# Patient Record
Sex: Female | Born: 1945 | Race: White | Hispanic: No | Marital: Married | Smoking: Former smoker
Health system: Southern US, Community
[De-identification: ages and names within clinical notes are randomized; demographics above are authoritative.]

## PROBLEM LIST (undated history)

## (undated) DIAGNOSIS — J3800 Paralysis of vocal cords and larynx, unspecified: Secondary | ICD-10-CM

## (undated) DIAGNOSIS — K219 Gastro-esophageal reflux disease without esophagitis: Secondary | ICD-10-CM

## (undated) DIAGNOSIS — J9601 Acute respiratory failure with hypoxia: Secondary | ICD-10-CM

## (undated) DIAGNOSIS — I739 Peripheral vascular disease, unspecified: Secondary | ICD-10-CM

## (undated) DIAGNOSIS — C8590 Non-Hodgkin lymphoma, unspecified, unspecified site: Secondary | ICD-10-CM

## (undated) DIAGNOSIS — Z923 Personal history of irradiation: Secondary | ICD-10-CM

## (undated) DIAGNOSIS — R011 Cardiac murmur, unspecified: Secondary | ICD-10-CM

## (undated) DIAGNOSIS — E669 Obesity, unspecified: Secondary | ICD-10-CM

## (undated) DIAGNOSIS — M1990 Unspecified osteoarthritis, unspecified site: Secondary | ICD-10-CM

## (undated) DIAGNOSIS — J189 Pneumonia, unspecified organism: Secondary | ICD-10-CM

## (undated) DIAGNOSIS — T4145XA Adverse effect of unspecified anesthetic, initial encounter: Secondary | ICD-10-CM

## (undated) DIAGNOSIS — E079 Disorder of thyroid, unspecified: Secondary | ICD-10-CM

## (undated) DIAGNOSIS — E039 Hypothyroidism, unspecified: Secondary | ICD-10-CM

## (undated) DIAGNOSIS — I10 Essential (primary) hypertension: Secondary | ICD-10-CM

## (undated) DIAGNOSIS — C801 Malignant (primary) neoplasm, unspecified: Secondary | ICD-10-CM

## (undated) DIAGNOSIS — C8330 Diffuse large B-cell lymphoma, unspecified site: Secondary | ICD-10-CM

## (undated) DIAGNOSIS — T8859XA Other complications of anesthesia, initial encounter: Secondary | ICD-10-CM

## (undated) HISTORY — DX: Obesity, unspecified: E66.9

## (undated) HISTORY — PX: JOINT REPLACEMENT: SHX530

## (undated) HISTORY — PX: AMPUTATION: SHX166

## (undated) HISTORY — PX: VASCULAR SURGERY: SHX849

## (undated) HISTORY — DX: Non-Hodgkin lymphoma, unspecified, unspecified site: C85.90

## (undated) HISTORY — DX: Essential (primary) hypertension: I10

## (undated) HISTORY — PX: TONSILLECTOMY: SUR1361

## (undated) HISTORY — PX: ESOPHAGOGASTRODUODENOSCOPY: SHX1529

## (undated) HISTORY — PX: CATARACT EXTRACTION, BILATERAL: SHX1313

## (undated) HISTORY — PX: COLONOSCOPY: SHX174

## (undated) HISTORY — DX: Malignant (primary) neoplasm, unspecified: C80.1

## (undated) HISTORY — PX: TUBAL LIGATION: SHX77

---
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
## 1993-04-21 DIAGNOSIS — I739 Peripheral vascular disease, unspecified: Secondary | ICD-10-CM

## 1993-04-21 HISTORY — DX: Peripheral vascular disease, unspecified: I73.9

## 1993-04-21 HISTORY — PX: HAND SURGERY: SHX662

## 1999-07-11 ENCOUNTER — Other Ambulatory Visit: Admission: RE | Admit: 1999-07-11 | Discharge: 1999-07-11 | Payer: Self-pay | Admitting: Obstetrics and Gynecology

## 2002-03-30 ENCOUNTER — Other Ambulatory Visit: Admission: RE | Admit: 2002-03-30 | Discharge: 2002-03-30 | Payer: Self-pay | Admitting: Obstetrics and Gynecology

## 2003-08-02 ENCOUNTER — Other Ambulatory Visit: Admission: RE | Admit: 2003-08-02 | Discharge: 2003-08-02 | Payer: Self-pay | Admitting: Obstetrics and Gynecology

## 2004-06-21 ENCOUNTER — Ambulatory Visit: Payer: Self-pay | Admitting: Internal Medicine

## 2004-09-12 ENCOUNTER — Ambulatory Visit: Payer: Self-pay | Admitting: Internal Medicine

## 2004-09-26 ENCOUNTER — Ambulatory Visit: Payer: Self-pay | Admitting: Internal Medicine

## 2004-11-25 ENCOUNTER — Ambulatory Visit: Payer: Self-pay | Admitting: Internal Medicine

## 2005-01-27 ENCOUNTER — Other Ambulatory Visit: Admission: RE | Admit: 2005-01-27 | Discharge: 2005-01-27 | Payer: Self-pay | Admitting: Obstetrics and Gynecology

## 2005-02-25 ENCOUNTER — Ambulatory Visit: Payer: Self-pay | Admitting: Internal Medicine

## 2005-06-02 ENCOUNTER — Ambulatory Visit: Payer: Self-pay | Admitting: Internal Medicine

## 2005-06-16 ENCOUNTER — Ambulatory Visit: Payer: Self-pay | Admitting: Internal Medicine

## 2006-04-07 ENCOUNTER — Ambulatory Visit: Payer: Self-pay | Admitting: Internal Medicine

## 2006-06-26 ENCOUNTER — Ambulatory Visit: Payer: Self-pay | Admitting: Internal Medicine

## 2006-06-26 LAB — CONVERTED CEMR LAB
ALT: 16 units/L (ref 0–40)
AST: 20 units/L (ref 0–37)
Albumin: 3.8 g/dL (ref 3.5–5.2)
Alkaline Phosphatase: 56 units/L (ref 39–117)
BUN: 14 mg/dL (ref 6–23)
Basophils Absolute: 0 10*3/uL (ref 0.0–0.1)
Calcium: 9.3 mg/dL (ref 8.4–10.5)
Chloride: 101 meq/L (ref 96–112)
Cholesterol: 205 mg/dL (ref 0–200)
Eosinophils Absolute: 0.2 10*3/uL (ref 0.0–0.6)
Eosinophils Relative: 2.3 % (ref 0.0–5.0)
GFR calc Af Amer: 110 mL/min
GFR calc non Af Amer: 91 mL/min
Glucose, Bld: 100 mg/dL — ABNORMAL HIGH (ref 70–99)
HDL: 44.9 mg/dL (ref >39.0)
Lymphocytes Relative: 22.3 % (ref 12.0–46.0)
MCV: 82.3 fL (ref 78.0–100.0)
Monocytes Relative: 11.1 % — ABNORMAL HIGH (ref 3.0–11.0)
Neutro Abs: 4.4 10*3/uL (ref 1.4–7.7)
Platelets: 274 10*3/uL (ref 150–400)
RBC: 4.92 M/uL (ref 3.87–5.11)
Triglycerides: 68 mg/dL (ref 0–149)
VLDL: 14 mg/dL (ref 0–40)
WBC: 7 10*3/uL (ref 4.5–10.5)

## 2006-08-13 ENCOUNTER — Ambulatory Visit: Payer: Self-pay | Admitting: Internal Medicine

## 2006-08-25 ENCOUNTER — Encounter: Payer: Self-pay | Admitting: Internal Medicine

## 2006-08-25 ENCOUNTER — Ambulatory Visit: Payer: Self-pay

## 2006-09-10 ENCOUNTER — Ambulatory Visit: Payer: Self-pay | Admitting: Internal Medicine

## 2008-03-21 ENCOUNTER — Encounter: Payer: Self-pay | Admitting: Internal Medicine

## 2008-03-24 ENCOUNTER — Encounter: Payer: Self-pay | Admitting: Internal Medicine

## 2009-03-21 DIAGNOSIS — C801 Malignant (primary) neoplasm, unspecified: Secondary | ICD-10-CM

## 2009-03-21 DIAGNOSIS — C50919 Malignant neoplasm of unspecified site of unspecified female breast: Secondary | ICD-10-CM

## 2009-03-21 HISTORY — DX: Malignant neoplasm of unspecified site of unspecified female breast: C50.919

## 2009-03-21 HISTORY — DX: Malignant (primary) neoplasm, unspecified: C80.1

## 2009-03-26 ENCOUNTER — Encounter (INDEPENDENT_AMBULATORY_CARE_PROVIDER_SITE_OTHER): Payer: Self-pay | Admitting: *Deleted

## 2009-03-27 ENCOUNTER — Encounter (INDEPENDENT_AMBULATORY_CARE_PROVIDER_SITE_OTHER): Payer: Self-pay | Admitting: *Deleted

## 2009-03-29 ENCOUNTER — Encounter (INDEPENDENT_AMBULATORY_CARE_PROVIDER_SITE_OTHER): Payer: Self-pay | Admitting: *Deleted

## 2009-04-03 ENCOUNTER — Encounter (INDEPENDENT_AMBULATORY_CARE_PROVIDER_SITE_OTHER): Payer: Self-pay | Admitting: *Deleted

## 2009-04-18 ENCOUNTER — Encounter: Admission: RE | Admit: 2009-04-18 | Discharge: 2009-04-18 | Payer: Self-pay | Admitting: General Surgery

## 2009-04-21 HISTORY — PX: BREAST SURGERY: SHX581

## 2009-04-27 ENCOUNTER — Ambulatory Visit: Payer: Self-pay | Admitting: Oncology

## 2009-04-30 ENCOUNTER — Encounter: Payer: Self-pay | Admitting: Internal Medicine

## 2009-04-30 ENCOUNTER — Ambulatory Visit: Payer: Self-pay | Admitting: Oncology

## 2009-05-02 ENCOUNTER — Encounter: Payer: Self-pay | Admitting: Internal Medicine

## 2009-05-02 LAB — CBC WITH DIFFERENTIAL (CANCER CENTER ONLY)
BASO#: 0.1 10*3/uL (ref 0.0–0.2)
BASO%: 0.8 % (ref 0.0–2.0)
Eosinophils Absolute: 0.4 10*3/uL (ref 0.0–0.5)
HCT: 38.5 % (ref 34.8–46.6)
HGB: 12.7 g/dL (ref 11.6–15.9)
LYMPH#: 2.4 10*3/uL (ref 0.9–3.3)
MCV: 84 fL (ref 81–101)
MONO#: 0.7 10*3/uL (ref 0.1–0.9)
NEUT%: 54.3 % (ref 39.6–80.0)
RBC: 4.6 10*6/uL (ref 3.70–5.32)
WBC: 7.8 10*3/uL (ref 3.9–10.0)

## 2009-05-02 LAB — CMP (CANCER CENTER ONLY)
ALT(SGPT): 17 U/L (ref 10–47)
BUN, Bld: 19 mg/dL (ref 7–22)
CO2: 29 mEq/L (ref 18–33)
Calcium: 9 mg/dL (ref 8.0–10.3)
Chloride: 100 mEq/L (ref 98–108)
Creat: 0.9 mg/dl (ref 0.6–1.2)
Glucose, Bld: 106 mg/dL (ref 73–118)

## 2009-05-03 LAB — CANCER ANTIGEN 27.29: CA 27.29: 25 U/mL (ref 0–39)

## 2009-05-03 LAB — LACTATE DEHYDROGENASE: LDH: 180 U/L (ref 94–250)

## 2009-05-31 ENCOUNTER — Ambulatory Visit: Payer: Self-pay | Admitting: Oncology

## 2009-06-04 ENCOUNTER — Encounter: Payer: Self-pay | Admitting: Internal Medicine

## 2009-06-04 LAB — COMPREHENSIVE METABOLIC PANEL
AST: 12 U/L (ref 0–37)
Alkaline Phosphatase: 49 U/L (ref 39–117)
BUN: 15 mg/dL (ref 6–23)
Creatinine, Ser: 0.77 mg/dL (ref 0.40–1.20)
Glucose, Bld: 125 mg/dL — ABNORMAL HIGH (ref 70–99)

## 2009-06-04 LAB — CBC WITH DIFFERENTIAL/PLATELET
Basophils Absolute: 0 10*3/uL (ref 0.0–0.1)
EOS%: 3.8 % (ref 0.0–7.0)
Eosinophils Absolute: 0.2 10*3/uL (ref 0.0–0.5)
HCT: 38.9 % (ref 34.8–46.6)
HGB: 13.3 g/dL (ref 11.6–15.9)
LYMPH%: 23.8 % (ref 14.0–49.7)
MCH: 28.8 pg (ref 25.1–34.0)
MCV: 84.4 fL (ref 79.5–101.0)
MONO%: 8.7 % (ref 0.0–14.0)
NEUT%: 63.1 % (ref 38.4–76.8)
Platelets: 238 10*3/uL (ref 145–400)

## 2009-07-24 ENCOUNTER — Ambulatory Visit: Payer: Self-pay | Admitting: Internal Medicine

## 2009-07-24 LAB — CONVERTED CEMR LAB
ALT: 14 units/L (ref 0–35)
Alkaline Phosphatase: 49 units/L (ref 39–117)
BUN: 13 mg/dL (ref 6–23)
Basophils Relative: 1 % (ref 0.0–3.0)
Bilirubin, Direct: 0.1 mg/dL (ref 0.0–0.3)
CO2: 30 meq/L (ref 19–32)
Chloride: 104 meq/L (ref 96–112)
Eosinophils Absolute: 0.3 10*3/uL (ref 0.0–0.7)
Eosinophils Relative: 4.6 % (ref 0.0–5.0)
GFR calc non Af Amer: 77.02 mL/min (ref >60)
Glucose, Bld: 102 mg/dL — ABNORMAL HIGH (ref 70–99)
HDL: 44.9 mg/dL (ref >39.00)
LDL Cholesterol: 117 mg/dL — ABNORMAL HIGH (ref 0–99)
Lymphocytes Relative: 27.9 % (ref 12.0–46.0)
MCV: 83.5 fL (ref 78.0–100.0)
Monocytes Absolute: 0.7 10*3/uL (ref 0.1–1.0)
Neutrophils Relative %: 57 % (ref 43.0–77.0)
Nitrite: NEGATIVE
Platelets: 255 10*3/uL (ref 150.0–400.0)
Potassium: 4.1 meq/L (ref 3.5–5.1)
RBC: 4.81 M/uL (ref 3.87–5.11)
Sodium: 141 meq/L (ref 135–145)
Total Bilirubin: 0.6 mg/dL (ref 0.3–1.2)
Total Protein, Urine: NEGATIVE mg/dL
Urine Glucose: NEGATIVE mg/dL
VLDL: 20.8 mg/dL (ref 0.0–40.0)
WBC: 6.9 10*3/uL (ref 4.5–10.5)
pH: 6.5 (ref 5.0–8.0)

## 2009-08-02 ENCOUNTER — Ambulatory Visit: Payer: Self-pay | Admitting: Internal Medicine

## 2009-08-02 DIAGNOSIS — I10 Essential (primary) hypertension: Secondary | ICD-10-CM | POA: Insufficient documentation

## 2009-08-31 ENCOUNTER — Ambulatory Visit: Payer: Self-pay | Admitting: Oncology

## 2009-09-03 ENCOUNTER — Encounter: Payer: Self-pay | Admitting: Internal Medicine

## 2009-09-03 LAB — COMPREHENSIVE METABOLIC PANEL
AST: 15 U/L (ref 0–37)
Albumin: 3.9 g/dL (ref 3.5–5.2)
Alkaline Phosphatase: 51 U/L (ref 39–117)
BUN: 15 mg/dL (ref 6–23)
Creatinine, Ser: 0.88 mg/dL (ref 0.40–1.20)
Potassium: 4.8 mEq/L (ref 3.5–5.3)
Total Bilirubin: 0.4 mg/dL (ref 0.3–1.2)

## 2009-09-03 LAB — CBC WITH DIFFERENTIAL/PLATELET
Basophils Absolute: 0 10*3/uL (ref 0.0–0.1)
EOS%: 4.4 % (ref 0.0–7.0)
HGB: 13.8 g/dL (ref 11.6–15.9)
LYMPH%: 24.8 % (ref 14.0–49.7)
MCH: 28.7 pg (ref 25.1–34.0)
MCV: 83.8 fL (ref 79.5–101.0)
MONO%: 7.3 % (ref 0.0–14.0)
RDW: 14.1 % (ref 11.2–14.5)

## 2009-10-15 ENCOUNTER — Ambulatory Visit: Payer: Self-pay | Admitting: Internal Medicine

## 2009-11-05 ENCOUNTER — Encounter: Admission: RE | Admit: 2009-11-05 | Discharge: 2009-11-05 | Payer: Self-pay | Admitting: Oncology

## 2009-11-08 ENCOUNTER — Ambulatory Visit: Payer: Self-pay | Admitting: Oncology

## 2009-11-12 ENCOUNTER — Encounter: Payer: Self-pay | Admitting: Internal Medicine

## 2009-11-12 LAB — CBC WITH DIFFERENTIAL/PLATELET
EOS%: 3.6 % (ref 0.0–7.0)
MCH: 29 pg (ref 25.1–34.0)
MCHC: 34.3 g/dL (ref 31.5–36.0)
MCV: 84.8 fL (ref 79.5–101.0)
MONO%: 7.3 % (ref 0.0–14.0)
RBC: 4.7 10*6/uL (ref 3.70–5.45)
RDW: 13.9 % (ref 11.2–14.5)

## 2009-11-12 LAB — COMPREHENSIVE METABOLIC PANEL
AST: 15 U/L (ref 0–37)
Albumin: 3.9 g/dL (ref 3.5–5.2)
Alkaline Phosphatase: 47 U/L (ref 39–117)
BUN: 15 mg/dL (ref 6–23)
Potassium: 4.1 mEq/L (ref 3.5–5.3)
Sodium: 141 mEq/L (ref 135–145)
Total Protein: 6.7 g/dL (ref 6.0–8.3)

## 2009-11-19 ENCOUNTER — Encounter: Payer: Self-pay | Admitting: Internal Medicine

## 2010-02-05 ENCOUNTER — Encounter: Payer: Self-pay | Admitting: Internal Medicine

## 2010-02-14 ENCOUNTER — Ambulatory Visit: Payer: Self-pay | Admitting: Oncology

## 2010-02-18 ENCOUNTER — Encounter: Payer: Self-pay | Admitting: Internal Medicine

## 2010-02-18 LAB — COMPREHENSIVE METABOLIC PANEL
ALT: 14 U/L (ref 0–35)
AST: 16 U/L (ref 0–37)
Albumin: 4 g/dL (ref 3.5–5.2)
Alkaline Phosphatase: 48 U/L (ref 39–117)
BUN: 17 mg/dL (ref 6–23)
CO2: 23 mEq/L (ref 19–32)
Calcium: 9.4 mg/dL (ref 8.4–10.5)
Chloride: 105 mEq/L (ref 96–112)
Creatinine, Ser: 1.05 mg/dL (ref 0.40–1.20)
Glucose, Bld: 141 mg/dL — ABNORMAL HIGH (ref 70–99)
Potassium: 4.3 mEq/L (ref 3.5–5.3)
Sodium: 140 mEq/L (ref 135–145)
Total Bilirubin: 0.4 mg/dL (ref 0.3–1.2)
Total Protein: 7.2 g/dL (ref 6.0–8.3)

## 2010-02-18 LAB — CBC WITH DIFFERENTIAL/PLATELET
BASO%: 0.5 % (ref 0.0–2.0)
Basophils Absolute: 0 10*3/uL (ref 0.0–0.1)
EOS%: 3.9 % (ref 0.0–7.0)
Eosinophils Absolute: 0.3 10*3/uL (ref 0.0–0.5)
HCT: 43 % (ref 34.8–46.6)
HGB: 14.2 g/dL (ref 11.6–15.9)
LYMPH%: 27.5 % (ref 14.0–49.7)
MCH: 28.2 pg (ref 25.1–34.0)
MCHC: 33 g/dL (ref 31.5–36.0)
MCV: 85.5 fL (ref 79.5–101.0)
MONO#: 0.8 10*3/uL (ref 0.1–0.9)
MONO%: 8.8 % (ref 0.0–14.0)
NEUT#: 5.1 10*3/uL (ref 1.5–6.5)
NEUT%: 59.3 % (ref 38.4–76.8)
Platelets: 249 10*3/uL (ref 145–400)
RBC: 5.03 10*6/uL (ref 3.70–5.45)
RDW: 13.7 % (ref 11.2–14.5)
WBC: 8.6 10*3/uL (ref 3.9–10.3)
lymph#: 2.4 10*3/uL (ref 0.9–3.3)

## 2010-02-26 ENCOUNTER — Encounter: Payer: Self-pay | Admitting: Internal Medicine

## 2010-03-18 ENCOUNTER — Encounter: Admission: RE | Admit: 2010-03-18 | Discharge: 2010-03-18 | Payer: Self-pay | Admitting: General Surgery

## 2010-03-26 ENCOUNTER — Encounter: Admission: RE | Admit: 2010-03-26 | Discharge: 2010-03-26 | Payer: Self-pay | Admitting: General Surgery

## 2010-03-28 ENCOUNTER — Encounter: Payer: Self-pay | Admitting: Internal Medicine

## 2010-03-28 ENCOUNTER — Ambulatory Visit
Admission: RE | Admit: 2010-03-28 | Discharge: 2010-03-28 | Payer: Self-pay | Source: Home / Self Care | Attending: General Surgery | Admitting: General Surgery

## 2010-04-01 HISTORY — PX: BREAST LUMPECTOMY: SHX2

## 2010-04-11 ENCOUNTER — Ambulatory Visit (HOSPITAL_COMMUNITY)
Admission: RE | Admit: 2010-04-11 | Discharge: 2010-04-11 | Payer: Self-pay | Source: Home / Self Care | Attending: General Surgery | Admitting: General Surgery

## 2010-04-20 ENCOUNTER — Emergency Department (HOSPITAL_COMMUNITY)
Admission: EM | Admit: 2010-04-20 | Discharge: 2010-04-20 | Payer: Self-pay | Source: Home / Self Care | Admitting: Emergency Medicine

## 2010-04-21 DIAGNOSIS — Z923 Personal history of irradiation: Secondary | ICD-10-CM

## 2010-04-21 HISTORY — DX: Personal history of irradiation: Z92.3

## 2010-05-12 ENCOUNTER — Encounter: Payer: Self-pay | Admitting: General Surgery

## 2010-05-13 ENCOUNTER — Ambulatory Visit: Payer: Self-pay | Admitting: Oncology

## 2010-05-15 LAB — CBC WITH DIFFERENTIAL/PLATELET
Basophils Absolute: 0 10*3/uL (ref 0.0–0.1)
Eosinophils Absolute: 0.3 10*3/uL (ref 0.0–0.5)
HCT: 36.2 % (ref 34.8–46.6)
HGB: 12.3 g/dL (ref 11.6–15.9)
LYMPH%: 26.9 % (ref 14.0–49.7)
MCV: 83.8 fL (ref 79.5–101.0)
MONO#: 0.7 10*3/uL (ref 0.1–0.9)
MONO%: 11.7 % (ref 0.0–14.0)
NEUT#: 3.5 10*3/uL (ref 1.5–6.5)
Platelets: 231 10*3/uL (ref 145–400)

## 2010-05-15 LAB — COMPREHENSIVE METABOLIC PANEL
Albumin: 3.7 g/dL (ref 3.5–5.2)
Alkaline Phosphatase: 42 U/L (ref 39–117)
BUN: 22 mg/dL (ref 6–23)
CO2: 23 mEq/L (ref 19–32)
Glucose, Bld: 101 mg/dL — ABNORMAL HIGH (ref 70–99)
Total Bilirubin: 0.6 mg/dL (ref 0.3–1.2)

## 2010-05-21 NOTE — Letter (Signed)
[facility]   Imported By: Maryln Gottron 05/23/2009 14:38:06  _____________________________________________________________________  External Attachment:    Type:   Image     Comment:   External Document

## 2010-05-21 NOTE — Letter (Signed)
Summary: [name]   Imported By: Maryln Gottron 01/14/2010 13:58:39  _____________________________________________________________________  External Attachment:    Type:   Image     Comment:   External Document

## 2010-05-21 NOTE — Letter (Signed)
[facility]   Imported By: Maryln Gottron 07/02/2009 10:52:11  _____________________________________________________________________  External Attachment:    Type:   Image     Comment:   External Document

## 2010-05-21 NOTE — Letter (Signed)
Summary: [facility]  [facility]   Imported By: Maryln Gottron 03/04/2010 09:10:48  _____________________________________________________________________  External Attachment:    Type:   Image     Comment:   External Document

## 2010-05-21 NOTE — Letter (Signed)
[facility]   Imported By: Maryln Gottron 09/21/2009 10:33:01  _____________________________________________________________________  External Attachment:    Type:   Image     Comment:   External Document

## 2010-05-21 NOTE — Assessment & Plan Note (Signed)
Summary: cpx/cjr   and a  Vital Signs:  Patient profile:   65 year old female Height:      62 inches Weight:      243 pounds BMI:     44.61 Temp:     98.0 degrees F oral BP sitting:   142 / 80  (right arm) Cuff size:   large  Vitals Entered By: Duard Brady LPN (August 02, 2009 3:01 PM) CC: cpx - pap 2010 - norm , mammo dec 2010 - dx breast ca , colo 2008 -no polyps Is Patient Diabetic? No   CC:  cpx - pap 2010 - norm , mammo dec 2010 - dx breast ca , and colo 2008 -no polyps.  History of Present Illness: 65 year old patient who is seen today for a wellness exam.  She was diagnosed with right breast cancer in December of last year.  She is followed closely oncology presently  on anti-estrogen therapy anticipating a mastectomy later in the year.  She has treated hypertension, and a history of Buerger's disease.  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 Father and 2 brothers died of CAD Mother- died of HF and Ca (?type) 2 sisters- DM  Social History: Reviewed history and no changes required. 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  The patient denies anorexia, fever, weight loss, vision loss, decreased hearing, hoarseness, chest pain, syncope, dyspnea on exertion, peripheral edema, prolonged cough, headaches, hemoptysis, abdominal pain, melena,  hematochezia, severe indigestion/heartburn, hematuria, incontinence, genital sores, muscle weakness, suspicious skin lesions, transient blindness, difficulty walking, depression, unusual weight change, abnormal bleeding, enlarged lymph nodes, angioedema, and breast masses.    Physical Exam  General:  overweight-appearing.  120/80overweight-appearing.   Head:  Normocephalic and atraumatic without obvious abnormalities. No apparent alopecia or balding. Eyes:  No corneal or conjunctival inflammation noted. EOMI. Perrla. Funduscopic exam benign, without hemorrhages, exudates or papilledema. Vision grossly normal. Ears:  External ear exam shows no significant lesions or deformities.  Otoscopic examination reveals clear canals, tympanic membranes are intact bilaterally without bulging, retraction, inflammation or discharge. Hearing is grossly normal bilaterally. Nose:  External nasal examination shows no deformity or inflammation. Nasal mucosa are pink and moist without lesions or exudates. Mouth:  Oral mucosa and oropharynx without lesions or exudates.  Teeth in good repair. Neck:  No deformities, masses, or tenderness noted. Chest Wall:  No deformities, masses, or tenderness noted. Breasts:  remote lumpectomy scar, right breast Lungs:  Normal respiratory effort, chest expands symmetrically. Lungs are clear to auscultation, no crackles or wheezes. Heart:  Normal rate and regular rhythm. S1 and S2 normal without gallop, murmur, click, rub or other extra sounds. Abdomen:  Bowel sounds positive,abdomen soft and non-tender without masses, organomegaly or hernias noted. Msk:  No deformity or scoliosis noted of thoracic or lumbar spine.   Pulses:  R and L carotid,radial,femoral,dorsalis pedis and posterior tibial pulses are full  and equal bilaterally Extremities:  partial amputations of both index fingers Neurologic:  alert & oriented X3, cranial nerves II-XII intact, sensation intact to light touch,  sensation intact to pinprick, and DTRs symmetrical and normal.  alert & oriented X3, cranial nerves II-XII intact, sensation intact to light touch, sensation intact to pinprick, and DTRs symmetrical and normal.   Skin:  Intact without suspicious lesions or rashes Cervical Nodes:  No lymphadenopathy noted Axillary Nodes:  No palpable lymphadenopathy Inguinal Nodes:  No significant adenopathy Psych:  Cognition and judgment appear intact. Alert and cooperative with normal attention span and concentration. No apparent delusions, illusions, hallucinations   Impression & Recommendations:  Problem # 1:  HYPERTENSION (ICD-401.9)  Her updated medication list for this problem includes:    Hyzaar 100-25 Mg Tabs (Losartan potassium-hctz) .Marland Kitchen... Take 1 tab by mouth daily needs ov  Her updated medication list for this problem includes:    Hyzaar 100-25 Mg Tabs (Losartan potassium-hctz) .Marland Kitchen... Take 1 tab by mouth daily needs ov  Problem # 2:  ADENOCARCINOMA, RIGHT BREAST (ICD-174.9)  Problem # 3:  MORBID OBESITY (ICD-278.01)  Complete Medication List: 1)  Hyzaar 100-25 Mg Tabs (Losartan potassium-hctz) .... Take 1 tab by mouth daily needs ov 2)  Tamoxifen Citrate 20 Mg Tabs (Tamoxifen citrate) .... Qd  Patient Instructions: 1)  Please schedule a follow-up appointment in 1 year. 2)  Limit your Sodium (Salt). 3)  It is important that you exercise regularly at least 20 minutes 5 times a week. If you develop chest pain, have severe difficulty breathing, or feel very tired , stop exercising immediately and seek medical attention. 4)  You need to lose weight. Consider a lower calorie diet and regular exercise.  5)  close oncology follow-up Prescriptions: HYZAAR 100-25 MG TABS (LOSARTAN POTASSIUM-HCTZ) Take 1 tab by mouth daily needs ov  #90 x 6   Entered and Authorized by:   [name]  MD   Signed by:   [name]  MD on 08/02/2009   Method used:   Print then Give to Patient   RxID:    [id number]

## 2010-05-21 NOTE — Letter (Signed)
[facility]   Imported By: Maryln Gottron 12/10/2009 14:41:56  _____________________________________________________________________  External Attachment:    Type:   Image     Comment:   External Document

## 2010-05-21 NOTE — Letter (Signed)
Summary: [facility]   Imported By: Maryln Gottron 06/01/2009 10:30:18  _____________________________________________________________________  External Attachment:    Type:   Image     Comment:   External Document

## 2010-05-21 NOTE — Assessment & Plan Note (Signed)
Summary: EAR CONGESTION (NEED IRRIGATION?) // RS   Vital Signs:  Patient profile:   65 year old female Weight:      246 pounds Temp:     98.1 degrees F oral BP sitting:   114 / 80  (left arm) Cuff size:   large  Vitals Entered By: Duard Brady LPN (October 15, 2009 4:56 PM) CC: c/o (L) ear 'clogged' Is Patient Diabetic? No   CC:  c/o (L) ear 'clogged'.  History of Present Illness: 65 year old patient, who presents today complaining of decreased auditory acuity on the left.  Denies any ear pain.  She has treated hypertension, history of breast cancer.  She has been using OTC measures without benefit.  Allergies (verified): No Known Drug Allergies  Past History:  Past Medical History: Reviewed history from 08/02/2009 and no changes required. 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  normal blood pressure Ears:  bilateral cerumen impactions   Impression & Recommendations:  Problem # 1:  CERUMEN IMPACTION (ICD-380.4) irrigated until clear  Problem # 2:  HYPERTENSION (ICD-401.9)  Her updated medication list for this problem includes:    Hyzaar 100-25 Mg Tabs (Losartan potassium-hctz) .Marland Kitchen... Take 1 tab by mouth daily needs ov  Complete Medication List: 1)  Hyzaar 100-25 Mg Tabs (Losartan potassium-hctz) .... Take 1 tab by mouth daily needs ov 2)  Tamoxifen Citrate 20 Mg Tabs (Tamoxifen citrate) .... Qd  Patient Instructions: 1)  Please schedule a follow-up appointment in 3 months. 2)  Limit your Sodium (Salt). 3)  It is important that you exercise regularly at least 20 minutes 5 times a week. If you develop chest pain, have severe difficulty breathing, or feel very tired , stop exercising immediately and seek medical attention. 4)  You need to lose weight. Consider a lower calorie diet and regular exercise.

## 2010-05-21 NOTE — Letter (Signed)
Summary: [facility]   Imported By: Maryln Gottron 03/22/2010 12:13:45  _____________________________________________________________________  External Attachment:    Type:   Image     Comment:   External Document

## 2010-05-22 ENCOUNTER — Ambulatory Visit: Payer: Managed Care, Other (non HMO) | Attending: Radiation Oncology | Admitting: Radiation Oncology

## 2010-05-22 DIAGNOSIS — C50419 Malignant neoplasm of upper-outer quadrant of unspecified female breast: Secondary | ICD-10-CM | POA: Insufficient documentation

## 2010-05-22 DIAGNOSIS — Z510 Encounter for antineoplastic radiation therapy: Secondary | ICD-10-CM | POA: Insufficient documentation

## 2010-05-23 NOTE — Op Note (Signed)
Summary: Procedure Wire Localization and Biopsy Specimen/[facility]  Procedure Wire Localization and Biopsy Specimen/[facility]   Imported By: Maryln Gottron 04/05/2010 11:21:53  _____________________________________________________________________  External Attachment:    Type:   Image     Comment:   External Document

## 2010-07-01 LAB — BASIC METABOLIC PANEL
BUN: 12 mg/dL (ref 6–23)
Chloride: 106 mEq/L (ref 96–112)
GFR calc non Af Amer: >60 mL/min (ref >60)
Glucose, Bld: 117 mg/dL — ABNORMAL HIGH (ref 70–99)
Potassium: 4.1 mEq/L (ref 3.5–5.1)
Sodium: 140 mEq/L (ref 135–145)

## 2010-07-01 LAB — CBC
HCT: 38.8 % (ref 36.0–46.0)
Hemoglobin: 12.9 g/dL (ref 12.0–15.0)
MCV: 85.8 fL (ref 78.0–100.0)
RDW: 13.8 % (ref 11.5–15.5)
WBC: 7.9 10*3/uL (ref 4.0–10.5)

## 2010-07-01 LAB — WOUND CULTURE

## 2010-07-01 LAB — SURGICAL PCR SCREEN: MRSA, PCR: NEGATIVE

## 2010-07-02 LAB — DIFFERENTIAL
Basophils Absolute: 0.1 10*3/uL (ref 0.0–0.1)
Basophils Relative: 1 % (ref 0–1)
Eosinophils Absolute: 0.3 10*3/uL (ref 0.0–0.7)
Eosinophils Relative: 4 % (ref 0–5)
Lymphocytes Relative: 32 % (ref 12–46)
Lymphs Abs: 2.5 10*3/uL (ref 0.7–4.0)
Monocytes Absolute: 0.7 10*3/uL (ref 0.1–1.0)
Monocytes Relative: 9 % (ref 3–12)
Neutro Abs: 4.3 10*3/uL (ref 1.7–7.7)
Neutrophils Relative %: 54 % (ref 43–77)

## 2010-07-02 LAB — BASIC METABOLIC PANEL
BUN: 13 mg/dL (ref 6–23)
CO2: 28 mEq/L (ref 19–32)
Calcium: 9.3 mg/dL (ref 8.4–10.5)
Chloride: 104 mEq/L (ref 96–112)
Creatinine, Ser: 0.86 mg/dL (ref 0.4–1.2)
GFR calc Af Amer: >60 mL/min (ref >60)
GFR calc non Af Amer: >60 mL/min (ref >60)
Glucose, Bld: 96 mg/dL (ref 70–99)
Potassium: 4.3 mEq/L (ref 3.5–5.1)
Sodium: 139 mEq/L (ref 135–145)

## 2010-07-02 LAB — CBC
HCT: 40.9 % (ref 36.0–46.0)
Hemoglobin: 13.6 g/dL (ref 12.0–15.0)
MCH: 28.5 pg (ref 26.0–34.0)
MCHC: 33.3 g/dL (ref 30.0–36.0)
MCV: 85.7 fL (ref 78.0–100.0)
Platelets: 238 10*3/uL (ref 150–400)
RBC: 4.77 MIL/uL (ref 3.87–5.11)
RDW: 13.9 % (ref 11.5–15.5)
WBC: 7.9 10*3/uL (ref 4.0–10.5)

## 2010-08-15 ENCOUNTER — Ambulatory Visit: Payer: Managed Care, Other (non HMO) | Attending: Radiation Oncology | Admitting: Radiation Oncology

## 2010-09-06 NOTE — Assessment & Plan Note (Signed)
[facility] OFFICE NOTE   NAME:KLEINMandee, Pluta                       MRN:          [id number]  DATE:08/13/2006                            DOB:          11-17-1945    A 65 year old female who is seen today for an annual exam. She has a  history of hypertension. She is followed by Dr. Ashley Royalty annually. 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 She required revascularization surgery and was a  heavy smoker at that time. This occurred in 1995. She is a gravida 3,  para 3, abortus 0. She has had a remote tubal ligation and a breast  biopsy.   REVIEW OF SYSTEMS:  Exam was otherwise negative, did have colonoscopy in  February of 2007, did receive a mammogram last fall.   SOCIAL HISTORY:  Married, 3 children, 1 grandchild, works as a Agricultural engineer.   FAMILY HISTORY:  Father and 2 brothers all died prematurely of  myocardial infarction, mother died at 43 with history of cancer and  heart failure. Two sisters, 1 with diabetes.   PHYSICAL EXAMINATION:  Revealed an overweight white female with a weight  of 254, blood pressure was 140/60.  FUNDI, EARS, NOSE, AND THROAT: Unremarkable.  Did have a transmitted murmur on the left.  CHEST: Clear.  CARDIOVASCULAR EXAM: Revealed a grade 2/6 systolic murmur loudest at the  base.  BREAST: Negative.  ABDOMEN: Obese, soft, and nontender. No organomegaly.  EXTREMITIES: Revealed partial amputations of both tips of the index  fingers, did have surgical scars at the base of her fourth fingers  bilaterally.  NEURO: Negative.  VASCULAR EXAM: Revealed radial, ulnar, and distal pedal pulses all to be  intact.   IMPRESSION:  Hypertension, systolic murmur, history of Burger's disease,  family history of premature coronary artery disease.   DISPOSITION:  We will set up for a 2D echocardiogram to rule out  hemodynamically  significant aortic stenosis. We will consider an  exercise stress test. She was asked to take daily aspirin 325 daily.  Weight loss encouraged, due to some peripheral edema Hyzaar will  substituted for the amlodipine. Blood pressure will be reassessed in 1  month.     [name], MD  Electronically Signed    PFK/MedQ  DD: 08/13/2006  DT: 08/13/2006  Job #: [id number]

## 2010-09-12 ENCOUNTER — Encounter (HOSPITAL_BASED_OUTPATIENT_CLINIC_OR_DEPARTMENT_OTHER): Payer: Managed Care, Other (non HMO) | Admitting: Oncology

## 2010-09-12 ENCOUNTER — Other Ambulatory Visit: Payer: Self-pay | Admitting: Oncology

## 2010-09-12 DIAGNOSIS — Z170 Estrogen receptor positive status [ER+]: Secondary | ICD-10-CM

## 2010-09-12 DIAGNOSIS — C50419 Malignant neoplasm of upper-outer quadrant of unspecified female breast: Secondary | ICD-10-CM

## 2010-09-12 LAB — COMPREHENSIVE METABOLIC PANEL
ALT: 17 U/L (ref 0–35)
CO2: 23 mEq/L (ref 19–32)
Calcium: 9 mg/dL (ref 8.4–10.5)
Chloride: 103 mEq/L (ref 96–112)
Glucose, Bld: 124 mg/dL — ABNORMAL HIGH (ref 70–99)
Sodium: 138 mEq/L (ref 135–145)
Total Bilirubin: 0.8 mg/dL (ref 0.3–1.2)
Total Protein: 6.7 g/dL (ref 6.0–8.3)

## 2010-09-12 LAB — CBC WITH DIFFERENTIAL/PLATELET
BASO%: 0.3 % (ref 0.0–2.0)
Eosinophils Absolute: 0.1 10*3/uL (ref 0.0–0.5)
HCT: 36.8 % (ref 34.8–46.6)
LYMPH%: 18.4 % (ref 14.0–49.7)
MCHC: 33.7 g/dL (ref 31.5–36.0)
MONO#: 0.5 10*3/uL (ref 0.1–0.9)
NEUT#: 4 10*3/uL (ref 1.5–6.5)
NEUT%: 70.8 % (ref 38.4–76.8)
Platelets: 215 10*3/uL (ref 145–400)
RBC: 4.39 10*6/uL (ref 3.70–5.45)
WBC: 5.6 10*3/uL (ref 3.9–10.3)
lymph#: 1 10*3/uL (ref 0.9–3.3)

## 2010-10-22 ENCOUNTER — Other Ambulatory Visit: Payer: Self-pay

## 2010-10-22 MED ORDER — LOSARTAN POTASSIUM-HCTZ 100-25 MG PO TABS: 1.0000 | ORAL_TABLET | Freq: Every day | ORAL | Status: DC

## 2010-10-22 NOTE — Telephone Encounter (Signed)
Refill faxed back to [facility]

## 2010-11-12 ENCOUNTER — Emergency Department (HOSPITAL_COMMUNITY): Payer: Managed Care, Other (non HMO)

## 2010-11-12 ENCOUNTER — Inpatient Hospital Stay (HOSPITAL_COMMUNITY)
Admission: EM | Admit: 2010-11-12 | Discharge: 2010-11-14 | DRG: 607 | Disposition: A | Discharge status: Home / Self Care | Payer: Managed Care, Other (non HMO) | Attending: Family Medicine | Admitting: Family Medicine

## 2010-11-12 DIAGNOSIS — Z9889 Other specified postprocedural states: Secondary | ICD-10-CM

## 2010-11-12 DIAGNOSIS — Y842 Radiological procedure and radiotherapy as the cause of abnormal reaction of the patient, or of later complication, without mention of misadventure at the time of the procedure: Secondary | ICD-10-CM | POA: Diagnosis present

## 2010-11-12 DIAGNOSIS — Z9010 Acquired absence of unspecified breast and nipple: Secondary | ICD-10-CM

## 2010-11-12 DIAGNOSIS — L539 Erythematous condition, unspecified: Principal | ICD-10-CM | POA: Diagnosis present

## 2010-11-12 DIAGNOSIS — I10 Essential (primary) hypertension: Secondary | ICD-10-CM | POA: Diagnosis present

## 2010-11-12 DIAGNOSIS — D649 Anemia, unspecified: Secondary | ICD-10-CM | POA: Diagnosis present

## 2010-11-12 DIAGNOSIS — E876 Hypokalemia: Secondary | ICD-10-CM | POA: Diagnosis present

## 2010-11-12 DIAGNOSIS — Z923 Personal history of irradiation: Secondary | ICD-10-CM

## 2010-11-12 DIAGNOSIS — E860 Dehydration: Secondary | ICD-10-CM | POA: Diagnosis present

## 2010-11-12 DIAGNOSIS — N61 Mastitis without abscess: Secondary | ICD-10-CM | POA: Diagnosis present

## 2010-11-12 DIAGNOSIS — C50919 Malignant neoplasm of unspecified site of unspecified female breast: Secondary | ICD-10-CM | POA: Diagnosis present

## 2010-11-12 LAB — DIFFERENTIAL
Basophils Absolute: 0 10*3/uL (ref 0.0–0.1)
Basophils Relative: 0 % (ref 0–1)
Lymphocytes Relative: 11 % — ABNORMAL LOW (ref 12–46)
Monocytes Absolute: 0.8 10*3/uL (ref 0.1–1.0)
Neutro Abs: 9.3 10*3/uL — ABNORMAL HIGH (ref 1.7–7.7)
Neutrophils Relative %: 82 % — ABNORMAL HIGH (ref 43–77)

## 2010-11-12 LAB — POCT I-STAT, CHEM 8
Creatinine, Ser: 1.2 mg/dL — ABNORMAL HIGH (ref 0.50–1.10)
HCT: 37 % (ref 36.0–46.0)
Hemoglobin: 12.6 g/dL (ref 12.0–15.0)
Potassium: 3 mEq/L — ABNORMAL LOW (ref 3.5–5.1)
Sodium: 136 mEq/L (ref 135–145)
TCO2: 24 mmol/L (ref 0–100)

## 2010-11-12 LAB — CBC
HCT: 35.5 % — ABNORMAL LOW (ref 36.0–46.0)
Hemoglobin: 12.2 g/dL (ref 12.0–15.0)
MCHC: 34.4 g/dL (ref 30.0–36.0)
RBC: 4.31 MIL/uL (ref 3.87–5.11)
WBC: 11.4 10*3/uL — ABNORMAL HIGH (ref 4.0–10.5)

## 2010-11-13 ENCOUNTER — Inpatient Hospital Stay
Admit: 2010-11-13 | Discharge: 2010-11-13 | Disposition: A | Discharge status: Home / Self Care | Payer: Managed Care, Other (non HMO) | Attending: Family Medicine | Admitting: Family Medicine

## 2010-11-13 ENCOUNTER — Inpatient Hospital Stay: Admit: 2010-11-13 | Payer: Managed Care, Other (non HMO)

## 2010-11-13 LAB — COMPREHENSIVE METABOLIC PANEL
ALT: 29 U/L (ref 0–35)
Albumin: 2.9 g/dL — ABNORMAL LOW (ref 3.5–5.2)
Alkaline Phosphatase: 40 U/L (ref 39–117)
Chloride: 103 mEq/L (ref 96–112)
Glucose, Bld: 106 mg/dL — ABNORMAL HIGH (ref 70–99)
Potassium: 3.4 mEq/L — ABNORMAL LOW (ref 3.5–5.1)
Sodium: 137 mEq/L (ref 135–145)
Total Bilirubin: 0.5 mg/dL (ref 0.3–1.2)
Total Protein: 6.6 g/dL (ref 6.0–8.3)

## 2010-11-13 LAB — PROTIME-INR
INR: 1.11 (ref 0.00–1.49)
Prothrombin Time: 14.5 seconds (ref 11.6–15.2)

## 2010-11-13 LAB — DIFFERENTIAL
Basophils Absolute: 0 10*3/uL (ref 0.0–0.1)
Basophils Relative: 0 % (ref 0–1)
Lymphocytes Relative: 12 % (ref 12–46)
Monocytes Absolute: 0.7 10*3/uL (ref 0.1–1.0)
Neutro Abs: 6.2 10*3/uL (ref 1.7–7.7)

## 2010-11-13 LAB — CBC
Hemoglobin: 11.5 g/dL — ABNORMAL LOW (ref 12.0–15.0)
MCHC: 32.9 g/dL (ref 30.0–36.0)
RDW: 14.3 % (ref 11.5–15.5)
WBC: 7.9 10*3/uL (ref 4.0–10.5)

## 2010-11-13 LAB — APTT: aPTT: 25 seconds (ref 24–37)

## 2010-11-14 LAB — BASIC METABOLIC PANEL
BUN: 15 mg/dL (ref 6–23)
Chloride: 107 mEq/L (ref 96–112)
Glucose, Bld: 104 mg/dL — ABNORMAL HIGH (ref 70–99)
Potassium: 4 mEq/L (ref 3.5–5.1)

## 2010-11-15 NOTE — Discharge Summary (Signed)
NAME:  Kristen Escobar, SING NO.:  [id number]  MEDICAL RECORD NO.:  [id number]  LOCATION:  1336                         FACILITY:  [facility]  PHYSICIAN:  Brendia Sacks, MD    DATE OF BIRTH:  [birth year]  DATE OF ADMISSION:  11/12/2010 DATE OF DISCHARGE:  11/14/2010                              DISCHARGE SUMMARY   PRIMARY CARE PHYSICIAN:  [name], MD  ONCOLOGIST:  Drue Second, MD  RADIATION ONCOLOGIST:  Lurline Hare, MD  CONDITION ON DISCHARGE:  Improved.  DISPOSITION:  Home.  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  HISTORY OF PRESENT ILLNESS:  This is a 65 year old woman with a history of localized breast cancer who is status post lumpectomy and radiation therapy, who developed sudden erythema and edema of her right breast. She came to the emergency room for further evaluation and was admitted to rule out abscess and for systemic treated with antibiotics.  HOSPITAL COURSE:  Erythema of the right breast.  The patient was admitted, ultrasound was performed which was negative for abscess.  It did reveal a markedly erythematous right breast.  The case was discussed with Dr. Michell Heinrich who felt this was not suggestive of a radiation recall dermatitis.  Dr. Luciano Cutter specific recommendations as documented in the chart were prednisone taper as well as oral antibiotics.  Sometimes antihistamines can be of use.  The patient had improvement in her symptoms and in the appearance of her right breast. She has minimal pain and stable for discharge.  A followup right breast ultrasound in 2-4 weeks was suggested.  CONSULTATIONS:  Radiation Oncology recommendations as above.  PROCEDURES:  None.  IMAGING: 1. Digital diagnostic bilateral mammogram. 2. Right breast ultrasound, July 25th:  Markedly erythematous right     breast with diffuse skin thickening.  Given the sharply demarcated     lines of erythema  which extend beyond the breast parenchyma and     consultation with the patient's radiation oncologist, Dr.     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  Hyperechoic area seen on ultrasound on general     occasion with lumpectomy, likely reflects post-lumpectomy scarring.     Small hypoechoic area at 11 o'clock position of the right breast     may be a small fluid collection related to the current inflammatory     changes.  No fluid could be aspirated from the hypoechoic area 10     o'clock position.  Suggest followup right breast ultrasound in 2-4     weeks.  No evidence of malignancy identified in the left breast. 3. Chest x-ray, July 24th:  No acute disease.  PERTINENT LABORATORY STUDIES: 1. Blood cultures x2, no growth today. 2. CBC notable for hemoglobin of 11.5. 3. Basic metabolic panel essentially unremarkable on discharge.  PHYSICAL EXAMINATION:  GENERAL:  On discharge, the patient is feeling well.  She is ready to go home. VITAL SIGNS:  She is afebrile, temperature is 98.1, pulse 92, respirations 24, blood pressure 121/68, saturation 95% on room air. CARDIOVASCULAR:  Regular rate and rhythm.  No murmur, rub, or gallop. RESPIRATORY:  Clear  to auscultation bilaterally.  No wheezes, rales, or rhonchi.  Normal respiratory effort. RIGHT BREAST:  Visualized only in the presence of Gwenn, Designer, jewellery.  There is decreased erythema of the right breast.  DISCHARGE INSTRUCTIONS:  The patient will be discharged home today. Diet is a heart-healthy diet.  Follow up with Dr. Amador Cunas in approximately 2 weeks and with Dr. Welton Flakes as directed.  The patient will follow up with Dr. Michell Heinrich next week.  DISCHARGE MEDICATIONS: 1. Augmentin 875 mg p.o. b.i.d. 2. Lortab 5/325 mg 1-2 tablets every 4 hours as needed for pain, #20,     no refills. 3. Prednisone 10 mg tablets.  Take 40 mg p.o. daily for 2 days     starting July 27th and 30 mg p.o.  daily for 2 days, then 20 mg p.o.     daily for 2 days, then 10 mg p.o. daily for 2 days, then stop. 4. Losartan/hydrochlorothiazide 100/25 mg p.o. daily. 5. Tamoxifen 20 mg p.o. daily.  Time coordinating discharge is 20 minutes.     Brendia Sacks, MD     DG/MEDQ  D:  11/14/2010  T:  11/15/2010  Job:  [id number]  cc:   [name], MD [address] [place] [address]  Drue Second, M.D.  Lurline Hare, M.D. Fax: [phone]  Electronically Signed by Brendia Sacks  on 11/15/2010 05:32:01 PM

## 2010-11-17 NOTE — H&P (Signed)
NAME:  Kristen Escobar, Kristen Escobar NO.:  [id number]  MEDICAL RECORD NO.:  [id number]  LOCATION:  WLED                         FACILITY:  [facility]  PHYSICIAN:  Michiel Cowboy, MDDATE OF BIRTH:  [birth year]  DATE OF ADMISSION:  11/12/2010 DATE OF DISCHARGE:                             HISTORY & PHYSICAL   ATTENDING PHYSICIAN:  Michiel Cowboy, M.D.  PRIMARY CARE PROVIDER:  [name], MD.  ONCOLOGY:  Drue Second, M.D.  CHIEF COMPLAINT:  Swollen, tender right breast.  HISTORY OF PRESENT ILLNESS:  Patient is a 65 year old female with history of a localized breast cancer, status post lumpectomy and radiation therapy, currently on tamoxifen.  She also has history of hypertension.  She has been doing well up until yesterday when she developed chills, nausea, vomiting and today when she was trying to take shower she noticed that her right breast is extremely red and swollen. She presents to the Emergency Department and was thought to probably have cellulitis, although at this point an abscess could not be completely ruled out.  She has not had any diarrhea.  She has not had any fevers that she could actually record.  She had a mild headache for which she took a couple of aspirin.  No chest pains.  No shortness of breath.  Otherwise review of systems is negative.  No discharge from her nipple.  No drainable area from the breast.  REVIEW OF SYSTEMS:  Otherwise, review of systems is negative except for HPI, 10 systems reviewed.  PAST MEDICAL HISTORY:  Significant for: 1. Localized breast cancer. 2. Hypertension.  SOCIAL HISTORY:  Patient does not smoke, drink or abuse drugs.  FAMILY HISTORY:  Significant for coronary artery disease.  ALLERGIES:  None.  MEDICATIONS: 1. Valsartan/hydrochlorothiazide 100/25 mg daily. 2. Tamoxifen 20 mg daily.  PHYSICAL EXAMINATION:  VITAL SIGNS:  Temperature 99.2, blood pressure 148/78, pulse was 79, respirations 20,  satting 92% on room air. GENERAL:  Patient appears to be in no acute distress. HEAD:  Nontraumatic.  Somewhat dry mucous membranes. LUNGS:  Clear to auscultation bilaterally. HEART:  Regular rate and rhythm.  No murmurs appreciated. ABDOMEN:  Soft, nontender, nondistended. EXTREMITIES:  Lower extremities:  Without clubbing, cyanosis or edema. NEUROLOGIC:  Appears to be grossly intact. BREASTS:  Right breast appears to be indurated with a large area of redness and swelling which is fairly well demarcated.  I have marked the area for comparison in the morning.  There is no drainable abscess that could be found or no fluctuant mass. LYMPHATIC SYSTEM:  No lymphadenopathy also noted.  LABORATORY DATA:  White blood cell count 11.4, hemoglobin 12.6.  Sodium 136, potassium 3.0, Emergency Department had repleted repeated this. Creatinine 1.2, which is above her baseline of 0.8.  DIAGNOSTIC STUDIES:  Otherwise plain film of the chest was done, which showed no acute disease and there was no air present on the breast.  ASSESSMENT AND PLAN:  This is a 65 year old with cellulitis of her right breast.  We will admit.  Of note, in the past, she has had a history of incisional infection, which grew Pseudomonas.  We will cover broadly right now with vanc and Zosyn  and she has a history of Pseudomonas and with a history of exposure to medical system.  We will also obtain blood cultures.  Obtain ultrasound of the breasts.  If there is a drainable abscess, we would get a surgical consult.  History of breast cancer:  Would need to notify oncology in a.m.  Would defer to them for writing of tamoxifen.  History of hypertension:  For right now since patient had slightly elevated creatinine, we will hold her losartan and since she appears to be dehydrated, we will hold her hydrochlorothiazide and watch her blood pressure for now.  We will be able to probably restart them in near future.  Hypokalemia:  We  will replace.  Nausea, vomiting:  This is likely a response to infection.  We will follow.  Dehydration, likely secondary to nausea, vomiting:  We will give intravenous fluids and follow orthostatics.  Prophylaxis:  Good p.o. intake and heparin subcu with Lovenox in case she needs the procedure done.  Make her n.p.o. post midnight in case there is something drainable.     Michiel Cowboy, MD     AVD/MEDQ  D:  11/12/2010  T:  11/12/2010  Job:  [id number]  cc:   Drue Second, M.D.  [name], MD [address] [place] [address]  Electronically Signed by Therisa Doyne MD on 11/17/2010 98:11:91 PM

## 2010-11-19 ENCOUNTER — Ambulatory Visit
Admission: RE | Admit: 2010-11-19 | Discharge: 2010-11-19 | Disposition: A | Discharge status: Home / Self Care | Payer: Managed Care, Other (non HMO) | Source: Ambulatory Visit | Attending: Radiation Oncology | Admitting: Radiation Oncology

## 2010-11-19 LAB — CULTURE, BLOOD (ROUTINE X 2)
Culture  Setup Time: 201207250846
Culture: NO GROWTH

## 2010-11-25 ENCOUNTER — Encounter: Payer: Self-pay | Admitting: Internal Medicine

## 2010-11-29 ENCOUNTER — Other Ambulatory Visit: Payer: Self-pay | Admitting: Radiation Oncology

## 2010-11-29 DIAGNOSIS — L539 Erythematous condition, unspecified: Secondary | ICD-10-CM

## 2010-11-29 DIAGNOSIS — N61 Mastitis without abscess: Secondary | ICD-10-CM

## 2010-12-02 ENCOUNTER — Ambulatory Visit (INDEPENDENT_AMBULATORY_CARE_PROVIDER_SITE_OTHER): Payer: Managed Care, Other (non HMO) | Admitting: Internal Medicine

## 2010-12-02 ENCOUNTER — Ambulatory Visit: Payer: Managed Care, Other (non HMO) | Admitting: Internal Medicine

## 2010-12-02 ENCOUNTER — Encounter: Payer: Self-pay | Admitting: Internal Medicine

## 2010-12-02 DIAGNOSIS — I10 Essential (primary) hypertension: Secondary | ICD-10-CM

## 2010-12-02 DIAGNOSIS — C50919 Malignant neoplasm of unspecified site of unspecified female breast: Secondary | ICD-10-CM

## 2010-12-02 NOTE — Progress Notes (Signed)
  Subjective:    Patient ID: Kristen Escobar, female    DOB: 1945-11-26, 65 y.o.   MRN: [id number]  HPI  65 year old patient who is seen today for followup of her hypertension. She was hospitalized recently for radiation mastitis and has done quite well since completion of steroid and antibiotic therapy. Her blood pressure has been well controlled. Hospital records reviewed. Tamoxifen is on hold pending further oncology evaluation. There is some concern about tamoxifen being a cofactor with the radiation mastitis    Review of Systems  Constitutional: Negative.   HENT: Negative for hearing loss, congestion, sore throat, rhinorrhea, dental problem, sinus pressure and tinnitus.   Eyes: Negative for pain, discharge and visual disturbance.  Respiratory: Negative for cough and shortness of breath.   Cardiovascular: Negative for chest pain, palpitations and leg swelling.  Gastrointestinal: Negative for nausea, vomiting, abdominal pain, diarrhea, constipation, blood in stool and abdominal distention.  Genitourinary: Negative for dysuria, urgency, frequency, hematuria, flank pain, vaginal bleeding, vaginal discharge, difficulty urinating, vaginal pain and pelvic pain.  Musculoskeletal: Negative for joint swelling, arthralgias and gait problem.  Skin: Positive for rash.  Neurological: Negative for dizziness, syncope, speech difficulty, weakness, numbness and headaches.  Hematological: Negative for adenopathy.  Psychiatric/Behavioral: Negative for behavioral problems, dysphoric mood and agitation. The patient is not nervous/anxious.        Objective:   Physical Exam  Constitutional: She is oriented to person, place, and time. She appears well-developed and well-nourished.       Blood pressure 120/80 overweight  HENT:  Head: Normocephalic.  Right Ear: External ear normal.  Left Ear: External ear normal.  Mouth/Throat: Oropharynx is clear and moist.  Eyes: Conjunctivae and EOM are normal. Pupils are  equal, round, and reactive to light.  Neck: Normal range of motion. Neck supple. No thyromegaly present.  Cardiovascular: Normal rate, regular rhythm, normal heart sounds and intact distal pulses.   Pulmonary/Chest: Effort normal and breath sounds normal.       Right breast reveals a well-healed lumpectomy scar there is only minimal residual erythema  Abdominal: Soft. Bowel sounds are normal. She exhibits no mass. There is no tenderness.  Musculoskeletal: Normal range of motion.  Lymphadenopathy:    She has no cervical adenopathy.  Neurological: She is alert and oriented to person, place, and time.  Skin: Skin is warm and dry. No rash noted.  Psychiatric: She has a normal mood and affect. Her behavior is normal.          Assessment & Plan:   Hypertension stable. Status post delayed radiation mastitis  Medications refilled. Reassess in 6 months

## 2010-12-02 NOTE — Patient Instructions (Signed)
Limit your sodium (Salt) intake  You need to lose weight.  Consider a lower calorie diet and regular exercise.  Please check your blood pressure on a regular basis.  If it is consistently greater than 150/90, please make an office appointment.  Return in 6 months for follow-up   

## 2010-12-04 ENCOUNTER — Encounter (HOSPITAL_BASED_OUTPATIENT_CLINIC_OR_DEPARTMENT_OTHER): Payer: Managed Care, Other (non HMO) | Admitting: Oncology

## 2010-12-04 ENCOUNTER — Other Ambulatory Visit: Payer: Self-pay | Admitting: Oncology

## 2010-12-04 DIAGNOSIS — C50419 Malignant neoplasm of upper-outer quadrant of unspecified female breast: Secondary | ICD-10-CM

## 2010-12-04 DIAGNOSIS — Z170 Estrogen receptor positive status [ER+]: Secondary | ICD-10-CM

## 2010-12-04 LAB — CBC WITH DIFFERENTIAL/PLATELET
BASO%: 0.3 % (ref 0.0–2.0)
EOS%: 2.6 % (ref 0.0–7.0)
LYMPH%: 22.6 % (ref 14.0–49.7)
MCH: 29.2 pg (ref 25.1–34.0)
MCHC: 34.3 g/dL (ref 31.5–36.0)
MONO#: 0.5 10*3/uL (ref 0.1–0.9)
RBC: 4.03 10*6/uL (ref 3.70–5.45)
WBC: 7 10*3/uL (ref 3.9–10.3)
lymph#: 1.6 10*3/uL (ref 0.9–3.3)

## 2010-12-04 LAB — COMPREHENSIVE METABOLIC PANEL
ALT: 13 U/L (ref 0–35)
AST: 12 U/L (ref 0–37)
CO2: 23 mEq/L (ref 19–32)
Chloride: 105 mEq/L (ref 96–112)
Creatinine, Ser: 1.36 mg/dL — ABNORMAL HIGH (ref 0.50–1.10)
Sodium: 138 mEq/L (ref 135–145)
Total Bilirubin: 0.5 mg/dL (ref 0.3–1.2)
Total Protein: 6.7 g/dL (ref 6.0–8.3)

## 2010-12-05 ENCOUNTER — Ambulatory Visit
Admission: RE | Admit: 2010-12-05 | Discharge: 2010-12-05 | Disposition: A | Discharge status: Home / Self Care | Payer: Managed Care, Other (non HMO) | Source: Ambulatory Visit | Attending: Radiation Oncology | Admitting: Radiation Oncology

## 2010-12-10 ENCOUNTER — Ambulatory Visit
Admission: RE | Admit: 2010-12-10 | Discharge: 2010-12-10 | Disposition: A | Discharge status: Home / Self Care | Payer: Managed Care, Other (non HMO) | Source: Ambulatory Visit | Attending: Radiation Oncology | Admitting: Radiation Oncology

## 2010-12-10 DIAGNOSIS — L539 Erythematous condition, unspecified: Secondary | ICD-10-CM

## 2011-02-17 ENCOUNTER — Encounter (INDEPENDENT_AMBULATORY_CARE_PROVIDER_SITE_OTHER): Payer: Self-pay | Admitting: General Surgery

## 2011-03-20 ENCOUNTER — Other Ambulatory Visit (HOSPITAL_BASED_OUTPATIENT_CLINIC_OR_DEPARTMENT_OTHER): Payer: Managed Care, Other (non HMO) | Admitting: Lab

## 2011-03-20 ENCOUNTER — Telehealth: Payer: Self-pay | Admitting: Oncology

## 2011-03-20 ENCOUNTER — Other Ambulatory Visit: Payer: Self-pay | Admitting: Oncology

## 2011-03-20 ENCOUNTER — Ambulatory Visit (HOSPITAL_BASED_OUTPATIENT_CLINIC_OR_DEPARTMENT_OTHER): Payer: Managed Care, Other (non HMO) | Admitting: Oncology

## 2011-03-20 VITALS — BP 155/77 | HR 67 | Temp 97.4°F | Wt 242.4 lb

## 2011-03-20 DIAGNOSIS — R42 Dizziness and giddiness: Secondary | ICD-10-CM

## 2011-03-20 DIAGNOSIS — M25559 Pain in unspecified hip: Secondary | ICD-10-CM

## 2011-03-20 DIAGNOSIS — Z170 Estrogen receptor positive status [ER+]: Secondary | ICD-10-CM

## 2011-03-20 DIAGNOSIS — D0590 Unspecified type of carcinoma in situ of unspecified breast: Secondary | ICD-10-CM

## 2011-03-20 DIAGNOSIS — C50419 Malignant neoplasm of upper-outer quadrant of unspecified female breast: Secondary | ICD-10-CM

## 2011-03-20 LAB — COMPREHENSIVE METABOLIC PANEL
ALT: 15 U/L (ref 0–35)
AST: 19 U/L (ref 0–37)
Albumin: 3.9 g/dL (ref 3.5–5.2)
Alkaline Phosphatase: 58 U/L (ref 39–117)
Potassium: 4.3 mEq/L (ref 3.5–5.3)
Sodium: 140 mEq/L (ref 135–145)
Total Protein: 6.7 g/dL (ref 6.0–8.3)

## 2011-03-20 LAB — CBC WITH DIFFERENTIAL/PLATELET
BASO%: 0.4 % (ref 0.0–2.0)
EOS%: 3.7 % (ref 0.0–7.0)
HCT: 37 % (ref 34.8–46.6)
LYMPH%: 17 % (ref 14.0–49.7)
MCH: 28.4 pg (ref 25.1–34.0)
MCHC: 33.6 g/dL (ref 31.5–36.0)
MONO#: 0.7 10*3/uL (ref 0.1–0.9)
MONO%: 9.1 % (ref 0.0–14.0)
NEUT%: 69.8 % (ref 38.4–76.8)
Platelets: 249 10*3/uL (ref 145–400)
RBC: 4.36 10*6/uL (ref 3.70–5.45)
WBC: 7.4 10*3/uL (ref 3.9–10.3)

## 2011-03-20 MED ORDER — TAMOXIFEN CITRATE 20 MG PO TABS: 20.0000 mg | ORAL_TABLET | Freq: Every day | ORAL | Status: DC

## 2011-03-20 NOTE — Progress Notes (Signed)
OFFICE PROGRESS NOTE   [name], MD [address] [place] [address]  DIAGNOSIS: 65 year old female with stage I invasive lobular carcinoma of the left breast  PRIOR THERAPY:  #1 patient received neoadjuvant tamoxifen for about 10 months followed by lumpectomy and then radiation to the right breast from 06/03/2010 to 07/12/2010.  #2 patient was then started on tamoxifen after completion of radiation. She subsequently developed radiation recall from tamoxifen apparently. So there tamoxifen was discontinued and she was placed on Aromasin 25 mg daily on 12/04/2010.  CURRENT THERAPY: Aromasin 25 mg daily since 12/04/2010.  INTERVAL HISTORY: Kristen Escobar 65 y.o. female returns for followup visit today. Overall she seems to be doing well. However she is complaining of bilateral hip pain since being Aromasin. She is also complaining of dizziness off-and-on. However she continues to work full-time. She is otherwise denying any nausea vomiting fevers chills night sweats headaches. She continues to palpate a tiny mass which most likely is a scar tissue it in the region of her previous surgical incision.  MEDICAL HISTORY: Past Medical History  Diagnosis Date  . Hypertension   . Cancer 03/2009    breast  . Obesity     ALLERGIES:   has no known allergies.  MEDICATIONS:  Current Outpatient Prescriptions  Medication Sig Dispense Refill  . exemestane (AROMASIN) 25 MG tablet Take 25 mg by mouth daily after breakfast.        . losartan-hydrochlorothiazide (HYZAAR) 100-25 MG per tablet Take 1 tablet by mouth daily.  90 tablet  2  . tamoxifen (NOLVADEX) 20 MG tablet Take 20 mg by mouth daily.        . tamoxifen (NOLVADEX) 20 MG tablet Take 1 tablet (20 mg total) by mouth daily.  90 tablet  12    SURGICAL HISTORY:  Past Surgical History  Procedure Date  . Tubal ligation   . Breast surgery     lumpectomy  . Amputation     partial amputation of both index fingers   . Vascular surgery     both hands    REVIEW OF SYSTEMS:  Pertinent items are noted in HPI.   PHYSICAL EXAMINATION: General appearance: alert, cooperative and appears stated age Neck: no adenopathy, no carotid bruit, no JVD, supple, symmetrical, trachea midline and thyroid not enlarged, symmetric, no tenderness/mass/nodules Lymph nodes: Cervical, supraclavicular, and axillary nodes normal. Resp: clear to auscultation bilaterally Back: symmetric, no curvature. ROM normal. No CVA tenderness. Cardio: regular rate and rhythm, S1, S2 normal, no murmur, click, rub or gallop and normal apical impulse GI: soft, non-tender; bowel sounds normal; no masses,  no organomegaly Extremities: extremities normal, atraumatic, no cyanosis or edema Neurologic: Alert and oriented X 3, normal strength and tone. Normal symmetric reflexes. Normal coordination and gait  ECOG PERFORMANCE STATUS: 1 - Symptomatic but completely ambulatory  Blood pressure 155/77, pulse 67, temperature 97.4 F (36.3 C), weight 242 lb 6.4 oz (109.952 kg).  LABORATORY DATA: Lab Results  Component Value Date   WBC 7.4 03/20/2011   HGB 12.4 03/20/2011   HCT 37.0 03/20/2011   MCV 84.7 03/20/2011   PLT 249 03/20/2011      Chemistry      Component Value Date/Time   NA 138 12/04/2010 1302   NA 142 05/02/2009 1517   K 4.1 12/04/2010 1302   K 4.1 05/02/2009 1517   CL 105 12/04/2010 1302   CL 100 05/02/2009 1517   CO2 23 12/04/2010 1302   CO2 29 05/02/2009 1517  BUN 26* 12/04/2010 1302   BUN 19 05/02/2009 1517   CREATININE 1.36* 12/04/2010 1302   CREATININE 0.9 05/02/2009 1517      Component Value Date/Time   CALCIUM 9.1 12/04/2010 1302   CALCIUM 9.0 05/02/2009 1517   ALKPHOS 42 12/04/2010 1302   ALKPHOS 65 05/02/2009 1517   AST 12 12/04/2010 1302   AST 24 05/02/2009 1517   ALT 13 12/04/2010 1302   BILITOT 0.5 12/04/2010 1302   BILITOT 0.60 05/02/2009 1517       RADIOGRAPHIC STUDIES:  No results found.  ASSESSMENT: 65 year old  female with stage I invasive lobular carcinoma of the right breast originally diagnosed in December 2010. She then went on to receive neoadjuvant tamoxifen from January 2011 to October 2011. This was then followed by a lumpectomy on 03/28/2010. Post lumpectomy patient received radiation therapy from 06/03/2010 to 07/12/2010. She was then restarted on tamoxifen unfortunately she developed rate radiation recall due to the tamoxifen. This was discontinued and on 12/04/2010 she was started on Aromasin. Since being on her Aromasin she does complain of bilateral hip pain and occasional dizziness. She is requesting that I discontinue the Aromasin due to her significant side effects.   PLAN: Plan patient and I discussed extensively risks and benefits of doing this. She did quite well with the tamoxifen and therefore we will start her on tamoxifen again. However we did discuss if she develops recall again due to her radiation then we will have to discontinue tamoxifen. We certainly could try other agents such as Arimidex or letrozole. All questions were answered patient and her husband understand. A prescription for tamoxifen was be prescribed to her pharmacy. She will be seen back in 6 months time. Certainly I could see her sooner if need arises. She also knows to call me if she needs to be seen if there are any changes in her care. She also needs to let me know if her breast feels tender or if she notices that the scar tissue has gotten bigger. Patient will be due for a mammogram in July 2013.   All questions were answered. The patient knows to call the clinic with any problems, questions or concerns. We can certainly see the patient much sooner if necessary.  I spent 25 minutes counseling the patient face to face. The total time spent in the appointment was 30 minutes.    Drue Second, MD Medical/Oncology [facility] [phone] (beeper) [phone] (Office)  03/20/2011, 10:08 AM

## 2011-03-20 NOTE — Telephone Encounter (Signed)
Gv pt appt for may2013 

## 2011-03-21 ENCOUNTER — Ambulatory Visit (INDEPENDENT_AMBULATORY_CARE_PROVIDER_SITE_OTHER): Payer: Managed Care, Other (non HMO) | Admitting: General Surgery

## 2011-03-21 ENCOUNTER — Encounter (INDEPENDENT_AMBULATORY_CARE_PROVIDER_SITE_OTHER): Payer: Self-pay | Admitting: General Surgery

## 2011-03-21 VITALS — BP 140/86 | HR 68 | Temp 97.7°F | Resp 20 | Ht 64.0 in | Wt 242.0 lb

## 2011-03-21 DIAGNOSIS — C50911 Malignant neoplasm of unspecified site of right female breast: Secondary | ICD-10-CM

## 2011-03-21 DIAGNOSIS — C50919 Malignant neoplasm of unspecified site of unspecified female breast: Secondary | ICD-10-CM

## 2011-03-21 DIAGNOSIS — C50411 Malignant neoplasm of upper-outer quadrant of right female breast: Secondary | ICD-10-CM | POA: Insufficient documentation

## 2011-03-21 NOTE — Progress Notes (Signed)
Chief Complaint  Patient presents with  . Breast Cancer Long Term Follow Up    est pt of dr Freida Busman..... rt br    HISTORY: Patient is a 66 year old female who is now one year status post breast conservation therapy for a T1 A. N0 M0 right breast cancer. 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 She had a reexcision for margins. She received postoperative radiation. In July she had a episode of severe right breast edema and redness. She had mammograms at that time which were BIRADS 3.  She was placed on prednisone and this went away immediately. She still has some thickening in the right breast.  She has had no development of any health problems. She was switched from tamoxifen to aromasin back in July and has been placed back on tamoxifen.  Past Medical History  Diagnosis Date  . Hypertension   . Obesity   . Cancer 03/2009    breast- rt    Past Surgical History  Procedure Date  . Tubal ligation   . Breast surgery     lumpectomy  . Amputation     partial amputation of both index fingers  . Vascular surgery     both hands    Current Outpatient Prescriptions  Medication Sig Dispense Refill  . losartan-hydrochlorothiazide (HYZAAR) 100-25 MG per tablet Take 1 tablet by mouth daily.  90 tablet  2  . tamoxifen (NOLVADEX) 20 MG tablet Take 20 mg by mouth daily.           No Known Allergies   Family History  Problem Relation Age of Onset  . Cancer Mother     pt unaware of what kind  . Hearing loss Mother   . Coronary artery disease Father   . Diabetes Father   . Diabetes Sister   . Cancer Sister     breast  . Coronary artery disease Brother   . Coronary artery disease Brother      History   Social History  . Marital Status: Married    Spouse Name: N/A    Number of Children: N/A  . Years of Education: N/A   Social History Main Topics  . Smoking status: Former Smoker    Quit date: 04/21/1993  . Smokeless tobacco: Never Used  . Alcohol  Use: Yes  . Drug Use: No  . Sexually Active: None    REVIEW OF SYSTEMS - PERTINENT POSITIVES ONLY: 12 point review of systems negative other than HPI and PMH.  EXAMCeasar Mons Vitals:   03/21/11 0918  BP: 140/86  Pulse: 68  Temp: 97.7 F (36.5 C)  Resp: 20    Gen:  No acute distress.  Well nourished and well groomed.   Neurological: Alert and oriented to person, place, and time. Coordination normal.  Head: Normocephalic and atraumatic.  Eyes: Conjunctivae are normal. Pupils are equal, round, and reactive to light. No scleral icterus.  Neck: Normal range of motion. Neck supple. No tracheal deviation or thyromegaly present.  Cardiovascular: Normal rate, regular rhythm, normal heart sounds and intact distal pulses.  Exam reveals no gallop and no friction rub.  No murmur heard. Respiratory: Effort normal.  No respiratory distress. No chest wall tenderness. Breath sounds normal.  No wheezes, rales or rhonchi.  Breast:  Bilateral breasts are without masses.  No axillary lymphadenopathy.  The right breast still has some edema of the skin and some scar tissue under the lumpectomy site.  There is no nipple change or  discharge.   GI: Soft. Bowel sounds are normal. The abdomen is soft and nontender.  There is no rebound and no guarding.  Musculoskeletal: Normal range of motion. Extremities are nontender.  Lymphadenopathy: No cervical, preauricular, postauricular or axillary adenopathy is present Skin: Skin is warm and dry. No rash noted. No diaphoresis. No erythema. No pallor. No clubbing, cyanosis, or edema.   Psychiatric: Normal mood and affect. Behavior is normal. Judgment and thought content normal.    RADIOLOGY RESULTS: Mammo 11/13/2010  IMPRESSION:  1. Markedly erythematous right breast, with diffuse skin  thickening. Given the sharply demarcated lines of erythema which  extend beyond the breast parenchyma, and in consultation with the  patient's radiation oncologist, Dr. 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 Findings were discussed with the patient, her husband,  and with the patient's nurse at [facility]. Consider consultation  with radiation oncology for management and/or follow-up as needed.  2. Hypoechoic ares seen on ultrasound in the general location of  the lumpectomy changes likely reflect post lumpectomy scarring. A  small hypoechoic area 11 o'clock position of the right breast may  be a small fluid collection related to the the current inflammatory  changes. No fluid could be aspirated from a hypoechoic area 10  o'clock position. A follow-up right breast ultrasound in 2-4 weeks  is suggested.  3. No evidence of malignancy is identified in the left breast.  When prior studies are made available from [facility], an addendum will be added to this report.  Right breast ultrasound in 2-4 weeks is recommended.  BI-RADS CATEGORY 3: Probably benign finding(s) - short interval  follow-up suggested.    ASSESSMENT AND PLAN: Breast cancer, right breast,+/+/-, Inv Lobular with LCIS, s/p BCT 03/2010 Pt on Tamoxifen per Dr. Welton Flakes.   In survivorship mode. She has appt with Dr. Michell Heinrich in feb 2013 and Dr. Welton Flakes in May 2013. I will see her back in July 2013. She will be due for mammograms at that time.    Maudry Diego MD Surgical Oncology, General and Endocrine Surgery [facility]   Visit Diagnoses: 1. Breast cancer, right breast     Primary Care Physician: [name], MD

## 2011-03-21 NOTE — Assessment & Plan Note (Signed)
Pt on Tamoxifen per Dr. Welton Flakes.   In survivorship mode. She has appt with Dr. Michell Heinrich in feb 2013 and Dr. Welton Flakes in May 2013. I will see her back in July 2013. She will be due for mammograms at that time.

## 2011-03-21 NOTE — Progress Notes (Deleted)
Subjective:     Patient ID: Kristen Escobar, female   DOB: February 04, 1946, 65 y.o.   MRN: [id number]  HPI   Review of Systems     Objective:   Physical Exam  Pulmonary/Chest:         Skin thickening and scar tissue

## 2011-03-21 NOTE — Patient Instructions (Signed)
Follow up with [place] in an alternating fashion.    See me in July 2013

## 2011-06-04 ENCOUNTER — Ambulatory Visit: Payer: Managed Care, Other (non HMO) | Admitting: Internal Medicine

## 2011-06-05 ENCOUNTER — Ambulatory Visit
Admission: RE | Admit: 2011-06-05 | Discharge: 2011-06-05 | Disposition: A | Discharge status: Home / Self Care | Payer: Managed Care, Other (non HMO) | Source: Ambulatory Visit | Attending: Radiation Oncology | Admitting: Radiation Oncology

## 2011-06-05 DIAGNOSIS — C50911 Malignant neoplasm of unspecified site of right female breast: Secondary | ICD-10-CM

## 2011-06-05 NOTE — Progress Notes (Signed)
Doing well. Restarted tamoxifen in Dec. 2012 as was having difficulty with joint pain from previous med aromasin. Last ultrasound was in July 2012.

## 2011-06-05 NOTE — Progress Notes (Signed)
CC:   Kristen Escobar, M.D. [name], MD  DIAGNOSIS:  T2 N0 invasive lobular carcinoma of the right breast.  PREVIOUS RADIATION:  60 Gy at 2 Gy per fraction x30 fractions completed 07/12/2010.  INTERVAL SINCE TREATMENT:  1 year.  INTERVAL HISTORY:  Kristen Escobar reports for followup today.  She is feeling well except for some arthralgias secondary to the aromatase inhibitor Dr. Welton Flakes had her on. She has been switched back to tamoxifen and is tolerating that better.  She is looking forward to retiring in May.  She has had no reactions since starting the tamoxifen.  She has no breast related complaints.  She had a negative mammogram in August.  PHYSICAL EXAMINATION:  She is a pleasant female in no distress sitting comfortably on the exam room table.  Blood pressure 170/71, temperature 98.1, pulse 65, weight 144 pounds.  She has no palpable cervical, supraclavicular, or axillary adenopathy.  Her right breast has some thickness and fibrosis associated with it.  There is no palpable masses. Nothing palpable in the left breast.  IMPRESSION:  T2 N0 invasive lobular carcinoma with no dermatitis upon re- entry of tamoxifen.  RECOMMENDATIONS:  Kristen Escobar looks great.  I have released her from followup with me.  She has regular scheduled followup with Dr. Welton Flakes. She knows she can contact me at any time with any questions or concerns. If this dermatitis ever does reappear, I think a trial of prednisone would be appropriate.    ______________________________ Lurline Hare, M.D. SW/MEDQ  D:  06/05/2011  T:  06/05/2011  Job:  [id number]

## 2011-07-14 ENCOUNTER — Other Ambulatory Visit: Payer: Self-pay | Admitting: Internal Medicine

## 2011-07-22 ENCOUNTER — Encounter (INDEPENDENT_AMBULATORY_CARE_PROVIDER_SITE_OTHER): Payer: Self-pay | Admitting: General Surgery

## 2011-09-04 DIAGNOSIS — M169 Osteoarthritis of hip, unspecified: Secondary | ICD-10-CM | POA: Diagnosis not present

## 2011-09-05 ENCOUNTER — Other Ambulatory Visit (HOSPITAL_COMMUNITY): Payer: Self-pay | Admitting: Orthopedic Surgery

## 2011-09-05 DIAGNOSIS — M25559 Pain in unspecified hip: Secondary | ICD-10-CM

## 2011-09-11 ENCOUNTER — Telehealth: Payer: Self-pay | Admitting: Oncology

## 2011-09-11 ENCOUNTER — Other Ambulatory Visit (HOSPITAL_BASED_OUTPATIENT_CLINIC_OR_DEPARTMENT_OTHER): Payer: Medicare Other | Admitting: Lab

## 2011-09-11 ENCOUNTER — Ambulatory Visit (HOSPITAL_BASED_OUTPATIENT_CLINIC_OR_DEPARTMENT_OTHER): Payer: Medicare Other | Admitting: Oncology

## 2011-09-11 ENCOUNTER — Encounter: Payer: Self-pay | Admitting: Oncology

## 2011-09-11 VITALS — BP 145/68 | HR 83 | Temp 98.7°F | Ht 64.0 in | Wt 238.7 lb

## 2011-09-11 DIAGNOSIS — C50919 Malignant neoplasm of unspecified site of unspecified female breast: Secondary | ICD-10-CM

## 2011-09-11 DIAGNOSIS — D0590 Unspecified type of carcinoma in situ of unspecified breast: Secondary | ICD-10-CM

## 2011-09-11 DIAGNOSIS — M1610 Unilateral primary osteoarthritis, unspecified hip: Secondary | ICD-10-CM | POA: Diagnosis not present

## 2011-09-11 DIAGNOSIS — Z79810 Long term (current) use of selective estrogen receptor modulators (SERMs): Secondary | ICD-10-CM | POA: Diagnosis not present

## 2011-09-11 DIAGNOSIS — C50911 Malignant neoplasm of unspecified site of right female breast: Secondary | ICD-10-CM

## 2011-09-11 LAB — CBC WITH DIFFERENTIAL/PLATELET
Basophils Absolute: 0.1 10*3/uL (ref 0.0–0.1)
EOS%: 3.3 % (ref 0.0–7.0)
LYMPH%: 15.8 % (ref 14.0–49.7)
MCH: 27.7 pg (ref 25.1–34.0)
MCV: 82.4 fL (ref 79.5–101.0)
MONO%: 7.5 % (ref 0.0–14.0)
Platelets: 242 10*3/uL (ref 145–400)
RBC: 4.84 10*6/uL (ref 3.70–5.45)
RDW: 14.8 % — ABNORMAL HIGH (ref 11.2–14.5)
nRBC: 0 % (ref 0–0)

## 2011-09-11 LAB — COMPREHENSIVE METABOLIC PANEL
Alkaline Phosphatase: 48 U/L (ref 39–117)
Creatinine, Ser: 1.28 mg/dL — ABNORMAL HIGH (ref 0.50–1.10)
Glucose, Bld: 124 mg/dL — ABNORMAL HIGH (ref 70–99)
Sodium: 138 mEq/L (ref 135–145)
Total Bilirubin: 0.6 mg/dL (ref 0.3–1.2)
Total Protein: 6.5 g/dL (ref 6.0–8.3)

## 2011-09-11 MED ORDER — TAMOXIFEN CITRATE 20 MG PO TABS: 20.0000 mg | ORAL_TABLET | Freq: Every day | ORAL | Status: DC

## 2011-09-11 NOTE — Patient Instructions (Signed)
1. Continue tamoxifen 20 mg daily  2. I will see you back in 6 months with labs

## 2011-09-11 NOTE — Progress Notes (Signed)
OFFICE PROGRESS NOTE   Kristen Boga, MD, MD [address] [place] [address] Dr. Almond Lint Dr. Charlann Boxer  DIAGNOSIS: 66 year old female with stage I invasive lobular carcinoma of the left breast  PRIOR THERAPY:  #1 patient received neoadjuvant tamoxifen for about 10 months followed by lumpectomy and then radiation to the right breast from 06/03/2010 to 07/12/2010.  #2 patient was then started on tamoxifen after completion of radiation. She subsequently developed radiation recall from tamoxifen apparently.   #3 patient was begun on Aromasin 25 mg daily starting in August 2012. However she developed significant problems from the Aromasin including myalgias and arthralgias. And on her last visit back in November 2012 the Aromasin was discontinued.  #4 patient went back on tamoxifen 20 mg daily and she remains on this at this time and is tolerating it very well.  CURRENT THERAPY:  Tamoxifen 20 mg daily  INTERVAL HISTORY: Kristen Escobar 66 y.o. female returns for followup visit today. Overall patient is doing well from oncology perspective. But she tells me that she has significant right hip arthritis. She is seeing Dr. Charlann Boxer from orthopedic surgery. There is apparently no clinical evidence of recurrent breast cancer but she does have significant arthritis that is impairing her activities of daily living especially walking. She is planning on possibly having some hip surgery performed in the near future. She is scheduled to have a bone scan tomorrow to rule out any kind of metastatic disease. If that is clear then apparently per patient she will be getting cortisone injections and if they are short-lived then she will proceed with hip replacement. Which I do concur with at this time. Otherwise she denies any fevers chills night sweats shortness of breath chest pains palpitations. She has not been ambulatory and hasn't been able to get out to the gym to work out do to her right  hip pain. Remainder of the 10 point review of systems is negative. MEDICAL HISTORY: Past Medical History  Diagnosis Date  . Hypertension   . Obesity   . Cancer 03/2009    breast- rt    ALLERGIES:   has no known allergies.  MEDICATIONS:  Current Outpatient Prescriptions  Medication Sig Dispense Refill  . losartan-hydrochlorothiazide (HYZAAR) 100-25 MG per tablet TAKE ONE TABLET BY MOUTH ONE TIME DAILY  90 tablet  0  . tamoxifen (NOLVADEX) 20 MG tablet Take 20 mg by mouth daily.          SURGICAL HISTORY:  Past Surgical History  Procedure Date  . Tubal ligation   . Breast surgery     lumpectomy  . Amputation     partial amputation of both index fingers  . Vascular surgery     both hands    REVIEW OF SYSTEMS:  Pertinent items are noted in HPI.   PHYSICAL EXAMINATION: General appearance: alert, cooperative and appears stated age Neck: no adenopathy, no carotid bruit, no JVD, supple, symmetrical, trachea midline and thyroid not enlarged, symmetric, no tenderness/mass/nodules Lymph nodes: Cervical, supraclavicular, and axillary nodes normal. Resp: clear to auscultation bilaterally Back: symmetric, no curvature. ROM normal. No CVA tenderness. Cardio: regular rate and rhythm, S1, S2 normal, no murmur, click, rub or gallop and normal apical impulse GI: soft, non-tender; bowel sounds normal; no masses,  no organomegaly Extremities: extremities normal, atraumatic, no cyanosis or edema Neurologic: Alert and oriented X 3, normal strength and tone. Normal symmetric reflexes. Normal coordination and gait  ECOG PERFORMANCE STATUS: 1 - Symptomatic but completely ambulatory  Blood pressure 145/68, pulse 83, temperature 98.7 F (37.1 C), temperature source Oral, height 5\' 4"  (1.626 m), weight 238 lb 11.2 oz (108.274 kg).  LABORATORY DATA: Lab Results  Component Value Date   WBC 7.2 09/11/2011   HGB 13.4 09/11/2011   HCT 39.9 09/11/2011   MCV 82.4 09/11/2011   PLT 242 09/11/2011       Chemistry      Component Value Date/Time   NA 140 03/20/2011 0853   NA 140 03/20/2011 0853   NA 142 05/02/2009 1517   K 4.3 03/20/2011 0853   K 4.3 03/20/2011 0853   K 4.1 05/02/2009 1517   CL 105 03/20/2011 0853   CL 105 03/20/2011 0853   CL 100 05/02/2009 1517   CO2 27 03/20/2011 0853   CO2 27 03/20/2011 0853   CO2 29 05/02/2009 1517   BUN 36* 03/20/2011 0853   BUN 36* 03/20/2011 0853   BUN 19 05/02/2009 1517   CREATININE 1.19* 03/20/2011 0853   CREATININE 1.19* 03/20/2011 0853   CREATININE 0.9 05/02/2009 1517      Component Value Date/Time   CALCIUM 9.0 03/20/2011 0853   CALCIUM 9.0 03/20/2011 0853   CALCIUM 9.0 05/02/2009 1517   ALKPHOS 58 03/20/2011 0853   ALKPHOS 58 03/20/2011 0853   ALKPHOS 65 05/02/2009 1517   AST 19 03/20/2011 0853   AST 19 03/20/2011 0853   AST 24 05/02/2009 1517   ALT 15 03/20/2011 0853   ALT 15 03/20/2011 0853   BILITOT 0.3 03/20/2011 0853   BILITOT 0.3 03/20/2011 0853   BILITOT 0.60 05/02/2009 1517       RADIOGRAPHIC STUDIES:  No results found.  ASSESSMENT: 66 year old female with:  1.  stage I invasive lobular carcinoma of the right breast originally diagnosed in December 2010. She then went on to receive neoadjuvant tamoxifen from January 2011 to October 2011. This was then followed by a lumpectomy on 03/28/2010. Post lumpectomy patient received radiation therapy from 06/03/2010 to 07/12/2010.  #2 patient is now on tamoxifen 20 mg daily and she is tolerating this quite well.  #3 right hip arthritis.     PLAN:   #1 patient will continue tamoxifen 20 mg daily a total of 5 years of therapy is planned.  #2 right hip arthritis I have recommended patient proceed with what is recommended by Dr.: At this time due to the fact that she is in significant pain. I do concur with getting a bone scan to make sure that there is no metastatic disease. However I do not think that that's going to be the case since patient had a very early stage disease.     #3 I will plan on seeing the patient back in 6 months time. However if there are any management decisions that need to be made in regards to patient's tamoxifen I will be more than happy to help out. Operatively.  All questions were answered. The patient knows to call the clinic with any problems, questions or concerns. We can certainly see the patient much sooner if necessary.  I spent 25 minutes counseling the patient face to face. The total time spent in the appointment was 30 minutes.    Drue Second, MD Medical/Oncology [facility] [phone] (beeper) [phone] (Office)  09/11/2011, 9:22 AM

## 2011-09-11 NOTE — Telephone Encounter (Signed)
gve the pt her nov 2013 appt calendar °

## 2011-09-12 ENCOUNTER — Encounter (HOSPITAL_COMMUNITY): Payer: Self-pay

## 2011-09-12 ENCOUNTER — Encounter (HOSPITAL_COMMUNITY)
Admission: RE | Admit: 2011-09-12 | Discharge: 2011-09-12 | Disposition: A | Discharge status: Home / Self Care | Payer: Medicare Other | Source: Ambulatory Visit | Attending: Orthopedic Surgery | Admitting: Orthopedic Surgery

## 2011-09-12 DIAGNOSIS — M25559 Pain in unspecified hip: Secondary | ICD-10-CM | POA: Diagnosis not present

## 2011-09-12 DIAGNOSIS — Z853 Personal history of malignant neoplasm of breast: Secondary | ICD-10-CM | POA: Insufficient documentation

## 2011-09-12 MED ORDER — TECHNETIUM TC 99M MEDRONATE IV KIT
25.0000 | PACK | Freq: Once | INTRAVENOUS | Status: AC | PRN
  Administered 2011-09-12: 25 via INTRAVENOUS

## 2011-09-24 ENCOUNTER — Encounter: Payer: Self-pay | Admitting: Internal Medicine

## 2011-09-24 ENCOUNTER — Ambulatory Visit (INDEPENDENT_AMBULATORY_CARE_PROVIDER_SITE_OTHER): Payer: Medicare Other | Admitting: Internal Medicine

## 2011-09-24 VITALS — BP 110/70 | HR 68 | Temp 97.9°F | Resp 20 | Ht 63.0 in | Wt 239.0 lb

## 2011-09-24 DIAGNOSIS — E785 Hyperlipidemia, unspecified: Secondary | ICD-10-CM | POA: Diagnosis not present

## 2011-09-24 DIAGNOSIS — Z0000 Encounter for general adult medical examination without abnormal findings: Secondary | ICD-10-CM

## 2011-09-24 DIAGNOSIS — C50919 Malignant neoplasm of unspecified site of unspecified female breast: Secondary | ICD-10-CM | POA: Diagnosis not present

## 2011-09-24 DIAGNOSIS — M1990 Unspecified osteoarthritis, unspecified site: Secondary | ICD-10-CM

## 2011-09-24 DIAGNOSIS — M1710 Unilateral primary osteoarthritis, unspecified knee: Secondary | ICD-10-CM | POA: Insufficient documentation

## 2011-09-24 DIAGNOSIS — Z23 Encounter for immunization: Secondary | ICD-10-CM | POA: Diagnosis not present

## 2011-09-24 DIAGNOSIS — I10 Essential (primary) hypertension: Secondary | ICD-10-CM | POA: Diagnosis not present

## 2011-09-24 DIAGNOSIS — C50911 Malignant neoplasm of unspecified site of right female breast: Secondary | ICD-10-CM

## 2011-09-24 LAB — LIPID PANEL
Cholesterol: 173 mg/dL (ref 0–200)
HDL: 45.4 mg/dL (ref >39.00)
VLDL: 24 mg/dL (ref 0.0–40.0)

## 2011-09-24 MED ORDER — LOSARTAN POTASSIUM-HCTZ 100-25 MG PO TABS: 1.0000 | ORAL_TABLET | Freq: Every day | ORAL | Status: DC

## 2011-09-24 NOTE — Progress Notes (Signed)
Subjective:    Patient ID: Kristen Escobar, female    DOB: 07/07/1945, 66 y.o.   MRN: [id number]  HPI  66 year old patient who is seen today for a preventive health examination. She has a history of right breast cancer and is followed closely by oncology. More recently she has developed significant right hip pain and is being followed by Dr. Vallery Sa for right hip osteoarthritis. She is considering right hip replacement therapy in the fall. She has had a recent bone scan and has had a colonoscopy in 2008. She is scheduled for a followup mammogram next month  Past Medical History  Diagnosis Date  . Hypertension   . Obesity   . Cancer 03/2009    breast- rt    History   Social History  . Marital Status: Married    Spouse Name: N/A    Number of Children: N/A  . Years of Education: N/A   Occupational History  . Not on file.   Social History Main Topics  . Smoking status: Former Smoker    Quit date: 04/21/1993  . Smokeless tobacco: Never Used  . Alcohol Use: Yes  . Drug Use: No  . Sexually Active: Yes   Other Topics Concern  . Not on file   Social History Narrative  . No narrative on file    Past Surgical History  Procedure Date  . Tubal ligation   . Breast surgery     lumpectomy  . Amputation     partial amputation of both index fingers  . Vascular surgery     both hands    Family History  Problem Relation Age of Onset  . Cancer Mother     pt unaware of what kind  . Hearing loss Mother   . Coronary artery disease Father   . Diabetes Father   . Diabetes Sister   . Cancer Sister     breast  . Coronary artery disease Brother   . Coronary artery disease Brother     No Known Allergies  Current Outpatient Prescriptions on File Prior to Visit  Medication Sig Dispense Refill  . losartan-hydrochlorothiazide (HYZAAR) 100-25 MG per tablet TAKE ONE TABLET BY MOUTH ONE TIME DAILY  90 tablet  0  . tamoxifen (NOLVADEX) 20 MG tablet Take 1 tablet (20 mg total) by mouth  daily.  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  Father and 2 brothers died of CAD  Mother- died of HF and Ca (?type)  2 sisters- DM   Social History:  Reviewed history and no changes required.  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 Risk factors, based on past  M,S,F history-  cardiovascular risk factors include hypertension  2.  Physical activities: Fairly active physically although limited by right hip osteoarthritis does swim in her outdoor pool daily  3.  Depression/mood: No history depression or mood disorder 4.  Hearing: No deficits  5.  ADL's: Independent in all aspects of daily living  6.  Fall risk: Moderate due to her weight and hip arthritis  7.  Home safety: No problems identified  8.  Height weight, and visual acuity; height and weight stable no change in visual acuity  9.  Counseling: Weight loss encouraged heart healthy diet recommended. Low-salt diet recommended  10. Lab orders based on risk factors: Will check a TSH and lipid profile  11. Referral : Followup orthopedic and oncology 12. Care plan: Heart healthy diet weight loss encouraged  13. Cognitive assessment: Alert and oriented with normal affect. No cognitive dysfunction.      Review of Systems    Constitutional: Negative for fever, appetite change, fatigue and unexpected weight change.  HENT: Negative for hearing loss, ear pain, nosebleeds, congestion, sore throat, mouth sores, trouble swallowing, neck stiffness, dental problem, voice change, sinus pressure and tinnitus.   Eyes: Negative for photophobia, pain, redness and visual disturbance.  Respiratory: Negative for cough, chest tightness and shortness of breath.   Cardiovascular: Negative for chest pain, palpitations and leg swelling.  Gastrointestinal: Negative for nausea, vomiting, abdominal pain, diarrhea, constipation, blood in stool, abdominal distention and rectal pain.  Genitourinary: Negative for dysuria, urgency, frequency, hematuria, flank pain, vaginal bleeding, vaginal discharge, difficulty urinating, genital sores, vaginal pain, menstrual problem and pelvic pain.  Musculoskeletal: Positive for gait problem. Negative for back pain and arthralgias.  Skin: Negative for rash.  Neurological: Negative for dizziness, syncope, speech difficulty, weakness, light-headedness, numbness and headaches.  Hematological: Negative for adenopathy. Does not bruise/bleed easily.  Psychiatric/Behavioral: Negative for suicidal ideas, behavioral problems, self-injury, dysphoric mood and agitation. The patient is not nervous/anxious.        Objective:   Physical Exam  Constitutional: She is oriented to person, place, and time. She appears well-developed and well-nourished.       Obese. Weight 239 Blood pressure 140/80  HENT:  Head: Normocephalic and atraumatic.  Right Ear: External ear normal.  Left Ear: External ear normal.  Mouth/Throat: Oropharynx is clear and moist.  Eyes: Conjunctivae and EOM are normal.  Neck: Normal range of motion. Neck supple. No JVD present. No thyromegaly present.  Cardiovascular: Normal rate, regular rhythm, normal heart sounds and intact distal pulses.   No murmur heard. Pulmonary/Chest: Effort normal and  breath sounds normal. She has no wheezes. She has no rales.  Abdominal: Soft. Bowel sounds are normal. She exhibits no distension and no mass. There is no tenderness. There is no rebound and no guarding.  Genitourinary: Vagina normal.  Musculoskeletal: Normal range of motion. She exhibits no edema and no tenderness.       Some  missing partial digits of the hands  Neurological: She is alert and oriented to person, place, and time. She has normal reflexes. No cranial nerve deficit. She exhibits normal muscle tone. Coordination normal.  Skin: Skin is warm and dry. No rash noted.  Psychiatric: She has a normal mood and affect. Her behavior is normal.          Assessment & Plan:    Preventive health Hypertension Breast cancer Right hip osteoarthritis  Weight loss encouraged Recheck in 6 months or as needed Medications renewed

## 2011-09-24 NOTE — Patient Instructions (Signed)

## 2011-09-30 DIAGNOSIS — M169 Osteoarthritis of hip, unspecified: Secondary | ICD-10-CM | POA: Diagnosis not present

## 2011-10-27 ENCOUNTER — Other Ambulatory Visit: Payer: Self-pay | Admitting: Oncology

## 2011-10-27 DIAGNOSIS — Z853 Personal history of malignant neoplasm of breast: Secondary | ICD-10-CM

## 2011-10-27 DIAGNOSIS — Z9889 Other specified postprocedural states: Secondary | ICD-10-CM

## 2011-12-02 ENCOUNTER — Encounter (HOSPITAL_COMMUNITY): Payer: Self-pay | Admitting: Pharmacy Technician

## 2011-12-08 ENCOUNTER — Encounter (HOSPITAL_COMMUNITY): Payer: Self-pay

## 2011-12-08 ENCOUNTER — Encounter (HOSPITAL_COMMUNITY)
Admission: RE | Admit: 2011-12-08 | Discharge: 2011-12-08 | Disposition: A | Discharge status: Home / Self Care | Payer: Medicare Other | Source: Ambulatory Visit | Attending: Orthopedic Surgery | Admitting: Orthopedic Surgery

## 2011-12-08 ENCOUNTER — Ambulatory Visit (HOSPITAL_COMMUNITY)
Admission: RE | Admit: 2011-12-08 | Discharge: 2011-12-08 | Disposition: A | Discharge status: Home / Self Care | Payer: Medicare Other | Source: Ambulatory Visit | Attending: Orthopedic Surgery | Admitting: Orthopedic Surgery

## 2011-12-08 DIAGNOSIS — Z01812 Encounter for preprocedural laboratory examination: Secondary | ICD-10-CM | POA: Insufficient documentation

## 2011-12-08 DIAGNOSIS — Z01818 Encounter for other preprocedural examination: Secondary | ICD-10-CM | POA: Diagnosis not present

## 2011-12-08 HISTORY — DX: Peripheral vascular disease, unspecified: I73.9

## 2011-12-08 LAB — ABO/RH: ABO/RH(D): A POS

## 2011-12-08 LAB — DIFFERENTIAL
Basophils Absolute: 0.1 10*3/uL (ref 0.0–0.1)
Basophils Relative: 1 % (ref 0–1)
Eosinophils Relative: 2 % (ref 0–5)
Lymphocytes Relative: 19 % (ref 12–46)
Monocytes Absolute: 0.7 10*3/uL (ref 0.1–1.0)
Neutro Abs: 6.3 10*3/uL (ref 1.7–7.7)

## 2011-12-08 LAB — CBC
HCT: 36.8 % (ref 36.0–46.0)
Hemoglobin: 12.1 g/dL (ref 12.0–15.0)
MCH: 27.5 pg (ref 26.0–34.0)
MCHC: 32.9 g/dL (ref 30.0–36.0)
MCV: 83.6 fL (ref 78.0–100.0)
RDW: 14 % (ref 11.5–15.5)

## 2011-12-08 LAB — APTT: aPTT: 27 seconds (ref 24–37)

## 2011-12-08 LAB — URINALYSIS, ROUTINE W REFLEX MICROSCOPIC
Bilirubin Urine: NEGATIVE
Ketones, ur: NEGATIVE mg/dL
Nitrite: NEGATIVE
Urobilinogen, UA: 0.2 mg/dL (ref 0.0–1.0)
pH: 6 (ref 5.0–8.0)

## 2011-12-08 LAB — URINE MICROSCOPIC-ADD ON

## 2011-12-08 LAB — BASIC METABOLIC PANEL
CO2: 26 mEq/L (ref 19–32)
Calcium: 9.2 mg/dL (ref 8.4–10.5)
Chloride: 101 mEq/L (ref 96–112)
Creatinine, Ser: 1.21 mg/dL — ABNORMAL HIGH (ref 0.50–1.10)
GFR calc Af Amer: 53 mL/min — ABNORMAL LOW (ref >90)
Sodium: 137 mEq/L (ref 135–145)

## 2011-12-08 LAB — SURGICAL PCR SCREEN
MRSA, PCR: NEGATIVE
Staphylococcus aureus: NEGATIVE

## 2011-12-08 NOTE — Pre-Procedure Instructions (Signed)
PREOP CBC, DIFF, BMET, PT, PTT, UA, T/S AND CXR WERE DONE TODAY AT [facility] - AS PER ANESTHESIOLOGIST'S GUIDELINES.   PT HAS EKG REPORT FROM 09/24/11 IN EPIC. PT HAS A NOTE OF MEDICAL CLEARANCE FROM DR. KWIATOWSKI-FAXED BY DR. Nilsa Nutting OFFICE AND PLACED ON HER CHART. PREOP INSTRUCTIONS DISCUSSED WITH PT USING TEACH BACK METHOD. PT'S BMET, UA, URINE MICROSCOPIC REPORTS FAXED TO DR. Nilsa Nutting OFFICE FOR REVIEW AND ORDERS REQUESTED-NONE IN EPIC TODAY.

## 2011-12-08 NOTE — Patient Instructions (Signed)
YOUR SURGERY IS SCHEDULED ON:  Tuesday  8/27  AT 8:45 AM  REPORT TO [place] SHORT STAY [facility]ENTER AT:  6:15 AM      PHONE # FOR SHORT STAY IS [phone]  DO NOT EAT OR DRINK ANYTHING AFTER MIDNIGHT THE NIGHT BEFORE YOUR SURGERY.  YOU MAY BRUSH YOUR TEETH, RINSE OUT YOUR MOUTH--BUT NO WATER, NO FOOD, NO CHEWING GUM, NO MINTS, NO CANDIES, NO CHEWING TOBACCO.  PLEASE TAKE THE FOLLOWING MEDICATIONS THE AM OF YOUR SURGERY WITH A FEW SIPS OF WATER:   TAMOXIFEN    IF YOU USE INHALERS--USE YOUR INHALERS THE AM OF YOUR SURGERY AND BRING INHALERS TO THE HOSPITAL -TAKE TO SURGERY.    IF YOU ARE DIABETIC:  DO NOT TAKE ANY DIABETIC MEDICATIONS THE AM OF YOUR SURGERY.  IF YOU TAKE INSULIN IN THE EVENINGS--PLEASE ONLY TAKE 1/2 NORMAL EVENING DOSE THE NIGHT BEFORE YOUR SURGERY.  NO INSULIN THE AM OF YOUR SURGERY.  IF YOU HAVE SLEEP APNEA AND USE CPAP OR BIPAP--PLEASE BRING THE MASK --NOT THE MACHINE-NOT THE TUBING   -JUST THE MASK. DO NOT BRING VALUABLES, MONEY, CREDIT CARDS.  CONTACT LENS, DENTURES / PARTIALS, GLASSES SHOULD NOT BE WORN TO SURGERY AND IN MOST CASES-HEARING AIDS WILL NEED TO BE REMOVED.  BRING YOUR GLASSES CASE, ANY EQUIPMENT NEEDED FOR YOUR CONTACT LENS. FOR PATIENTS ADMITTED TO THE HOSPITAL--CHECK OUT TIME THE DAY OF DISCHARGE IS 11:00 AM.  ALL INPATIENT ROOMS ARE PRIVATE - WITH BATHROOM, TELEPHONE, TELEVISION AND WIFI INTERNET. IF YOU ARE BEING DISCHARGED THE SAME DAY OF YOUR SURGERY--YOU CAN NOT DRIVE YOURSELF HOME--AND SHOULD NOT GO HOME ALONE BY TAXI OR BUS.  NO DRIVING OR OPERATING MACHINERY FOR 24 HOURS FOLLOWING ANESTHESIA / PAIN MEDICATIONS.                            SPECIAL INSTRUCTIONS:  CHLORHEXIDINE SOAP SHOWER (other brand names are Betasept and Hibiclens ) PLEASE SHOWER WITH CHLORHEXIDINE THE NIGHT BEFORE YOUR SURGERY AND THE AM OF YOUR SURGERY. DO NOT USE CHLORHEXIDINE ON YOUR FACE OR PRIVATE AREAS--YOU MAY USE YOUR NORMAL SOAP THOSE AREAS AND YOUR NORMAL SHAMPOO.  WOMEN  SHOULD AVOID SHAVING UNDER ARMS AND SHAVING LEGS 48 HOURS BEFORE USING CHLORHEXIDINE TO AVOID SKIN IRRITATION.  DO NOT USE IF ALLERGIC TO CHLORHEXIDINE.  PLEASE READ OVER ANY  FACT SHEETS THAT YOU WERE GIVEN: MRSA INFORMATION, BLOOD TRANSFUSION INFORMATION, INCENTIVE SPIROMETER INFORMATION.

## 2011-12-10 DIAGNOSIS — M169 Osteoarthritis of hip, unspecified: Secondary | ICD-10-CM | POA: Diagnosis not present

## 2011-12-11 ENCOUNTER — Ambulatory Visit
Admission: RE | Admit: 2011-12-11 | Discharge: 2011-12-11 | Disposition: A | Discharge status: Home / Self Care | Payer: Medicare Other | Source: Ambulatory Visit | Attending: Oncology | Admitting: Oncology

## 2011-12-11 DIAGNOSIS — Z853 Personal history of malignant neoplasm of breast: Secondary | ICD-10-CM | POA: Diagnosis not present

## 2011-12-11 DIAGNOSIS — Z9889 Other specified postprocedural states: Secondary | ICD-10-CM

## 2011-12-11 NOTE — H&P (Signed)
NAME:  Kristen Escobar, LEMME NO.:  [id number]  MEDICAL RECORD NO.:  [id number]  LOCATION:                               FACILITY:  [facility]  PHYSICIAN:  Madlyn Frankel. Charlann Boxer, M.D.  DATE OF BIRTH:  1945-06-25  DATE OF ADMISSION:  12/16/2011 DATE OF DISCHARGE:                             HISTORY & PHYSICAL   DATE OF SURGERY:  December 16, 2011.  ADMITTING DIAGNOSIS:  End-stage osteoarthritis, right hip.  PROPOSED PROCEDURE:  Anterior total hip arthroplasty, right hip.  HISTORY OF PRESENT ILLNESS:  This is a 66 year old lady with end-stage osteoarthritis of right hip that has failed conservative treatment. After discussion of treatment, benefits, risks, and options, the patient is now scheduled for total hip arthroplasty by anterior approach of the right hip.  Note that the patient is not a candidate for tranexamic acid, but she is a candidate for dexamethasone and will receive dexamethasone at surgery.  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  With her breast history and history of Buerger disease, we will put her on Xarelto 10 mg every other day for 2 weeks postop and then switch to aspirin for her DVT prophylaxis.  Her medical doctor is Dr. Eleonore Chiquito; specialty physician Dr. Drue Second.  She is planning on going home after surgery, and she is given her home medicines of Xarelto 10 mg every day for 2 weeks postop and then aspirin one b.i.d. for another 4 weeks postop.  Also the Robaxin, iron, MiraLAX, and Colace.  PAST MEDICAL HISTORY:  None.  DRUG ALLERGIES:  None.  CURRENT MEDICATIONS:  Losartan, hydrochlorothiazide 100/25 mg one daily, and tamoxifen 20 mg one daily.  SERIOUS MEDICAL ILLNESSES:  Hypertension, history of breast cancer, and history of Buerger disease.  PREVIOUS SURGERIES:  Breast lumpectomy and hand surgery for  Buerger disease bilaterally.  FAMILY HISTORY:  Positive for heart attack and coronary artery disease.  SOCIAL HISTORY:  The patient is married.  She is retired.  She used to smoke, but does not smoke any more.  She drinks rarely and again plans on going home after surgery.  REVIEW OF SYSTEMS:  CENTRAL NERVOUS SYSTEM:  Negative for headache, blurred vision, or dizziness.  PULMONARY:  Negative for shortness breath, PND, or orthopnea.  CARDIOVASCULAR:  Positive for hypertension. Also positive for history of Buerger disease with digital sympathectomy in the past with amputation of the index DIP bilaterally.  GI:  Negative for ulcers, hepatitis.  GU:  Negative for urinary tract difficulty. MUSCULOSKELETAL:  Positive as in HPI.  PHYSICAL EXAMINATION:  GENERAL:  This is a well-developed, well- nourished, obese lady, in no acute distress. VITAL SIGNS:  Blood pressure 139/74, pulse 72 and regular, respirations 14. HEENT:  Head normocephalic.  Nose patent.  Ears patent.  Pupils equal, round, and reactive to light.  Throat without injection. NECK:  Supple without adenopathy.  Carotids 2+ without bruits. CHEST:  Clear to auscultation.  No rales or rhonchi.  Respirations 14. HEART:  Regular rate and rhythm at 72 beats per minute without murmur with 1/6 systolic  ejection murmur. ABDOMEN:  Soft with active bowel sounds.  No masses or organomegaly. NEUROLOGIC:  The patient is alert and oriented to time, place, and person.  Cranial nerves II through XII grossly intact. EXTREMITIES:  Both hips with full extension with pain.  Further flexion to 95 degrees.  The right hip has 10 degrees of external rotation and internal rotation to neutral.  The left hip shows 20 degrees of external rotation and 10 degrees of internal rotation.  Neurovascular status intact.  ASSESSMENT:  End-stage osteoarthritis, right hip.  PLAN:  Total hip arthroplasty, right hip.     Jaquelyn Bitter. Tristram Milian,  P.A.   ______________________________ Madlyn Frankel Charlann Boxer, M.D.    SJC/MEDQ  D:  12/10/2011  T:  12/10/2011  Job:  [id number]

## 2011-12-16 ENCOUNTER — Inpatient Hospital Stay (HOSPITAL_COMMUNITY)
Admission: RE | Admit: 2011-12-16 | Discharge: 2011-12-18 | DRG: 470 | Disposition: A | Discharge status: Home Health | Payer: Medicare Other | Source: Ambulatory Visit | Attending: Orthopedic Surgery | Admitting: Orthopedic Surgery

## 2011-12-16 ENCOUNTER — Ambulatory Visit (HOSPITAL_COMMUNITY): Payer: Medicare Other | Admitting: Anesthesiology

## 2011-12-16 ENCOUNTER — Ambulatory Visit (HOSPITAL_COMMUNITY): Payer: Medicare Other

## 2011-12-16 ENCOUNTER — Encounter (HOSPITAL_COMMUNITY): Payer: Self-pay | Admitting: Anesthesiology

## 2011-12-16 ENCOUNTER — Encounter (HOSPITAL_COMMUNITY)
Admission: RE | Disposition: A | Discharge status: Home Health | Payer: Self-pay | Source: Ambulatory Visit | Attending: Orthopedic Surgery

## 2011-12-16 ENCOUNTER — Encounter (HOSPITAL_COMMUNITY): Payer: Self-pay | Admitting: *Deleted

## 2011-12-16 DIAGNOSIS — D62 Acute posthemorrhagic anemia: Secondary | ICD-10-CM | POA: Diagnosis not present

## 2011-12-16 DIAGNOSIS — I10 Essential (primary) hypertension: Secondary | ICD-10-CM | POA: Diagnosis present

## 2011-12-16 DIAGNOSIS — M169 Osteoarthritis of hip, unspecified: Secondary | ICD-10-CM | POA: Diagnosis not present

## 2011-12-16 DIAGNOSIS — M25559 Pain in unspecified hip: Secondary | ICD-10-CM | POA: Diagnosis not present

## 2011-12-16 DIAGNOSIS — M1610 Unilateral primary osteoarthritis, unspecified hip: Principal | ICD-10-CM | POA: Diagnosis present

## 2011-12-16 DIAGNOSIS — Z6841 Body Mass Index (BMI) 40.0 and over, adult: Secondary | ICD-10-CM

## 2011-12-16 DIAGNOSIS — Z96649 Presence of unspecified artificial hip joint: Secondary | ICD-10-CM

## 2011-12-16 DIAGNOSIS — I739 Peripheral vascular disease, unspecified: Secondary | ICD-10-CM | POA: Diagnosis present

## 2011-12-16 DIAGNOSIS — Z853 Personal history of malignant neoplasm of breast: Secondary | ICD-10-CM | POA: Diagnosis not present

## 2011-12-16 HISTORY — PX: TOTAL HIP ARTHROPLASTY: SHX124

## 2011-12-16 SURGERY — ARTHROPLASTY, HIP, TOTAL, ANTERIOR APPROACH
Anesthesia: General | Site: Hip | Laterality: Right | Wound class: Clean

## 2011-12-16 MED ORDER — HETASTARCH-ELECTROLYTES 6 % IV SOLN
INTRAVENOUS | Status: DC | PRN
  Administered 2011-12-16: 10:00:00 via INTRAVENOUS

## 2011-12-16 MED ORDER — METHOCARBAMOL 100 MG/ML IJ SOLN
500.0000 mg | Freq: Four times a day (QID) | INTRAVENOUS | Status: DC | PRN
  Administered 2011-12-16: 500 mg via INTRAVENOUS
  Filled 2011-12-16: qty 5

## 2011-12-16 MED ORDER — NEOSTIGMINE METHYLSULFATE 1 MG/ML IJ SOLN
INTRAMUSCULAR | Status: DC | PRN
  Administered 2011-12-16: 5 mg via INTRAVENOUS

## 2011-12-16 MED ORDER — HYDROMORPHONE HCL PF 1 MG/ML IJ SOLN
INTRAMUSCULAR | Status: DC | PRN
  Administered 2011-12-16: 1 mg via INTRAVENOUS

## 2011-12-16 MED ORDER — PHENOL 1.4 % MT LIQD: 1.0000 | OROMUCOSAL | Status: DC | PRN

## 2011-12-16 MED ORDER — FENTANYL CITRATE 0.05 MG/ML IJ SOLN
INTRAMUSCULAR | Status: DC | PRN
  Administered 2011-12-16: 100 ug via INTRAVENOUS

## 2011-12-16 MED ORDER — ROCURONIUM BROMIDE 100 MG/10ML IV SOLN
INTRAVENOUS | Status: DC | PRN
  Administered 2011-12-16: 50 mg via INTRAVENOUS
  Administered 2011-12-16: 20 mg via INTRAVENOUS

## 2011-12-16 MED ORDER — ZOLPIDEM TARTRATE 5 MG PO TABS: 5.0000 mg | ORAL_TABLET | Freq: Every evening | ORAL | Status: DC | PRN

## 2011-12-16 MED ORDER — CHLORHEXIDINE GLUCONATE 4 % EX LIQD
60.0000 mL | Freq: Once | CUTANEOUS | Status: DC
  Filled 2011-12-16: qty 60

## 2011-12-16 MED ORDER — GLYCOPYRROLATE 0.2 MG/ML IJ SOLN
INTRAMUSCULAR | Status: DC | PRN
  Administered 2011-12-16: .8 mg via INTRAVENOUS

## 2011-12-16 MED ORDER — CEFAZOLIN SODIUM-DEXTROSE 2-3 GM-% IV SOLR
2.0000 g | INTRAVENOUS | Status: AC
  Administered 2011-12-16: 2 g via INTRAVENOUS

## 2011-12-16 MED ORDER — BACITRACIN ZINC 500 UNIT/GM EX OINT
TOPICAL_OINTMENT | CUTANEOUS | Status: AC
  Filled 2011-12-16: qty 15

## 2011-12-16 MED ORDER — BACITRACIN ZINC 500 UNIT/GM EX OINT
TOPICAL_OINTMENT | CUTANEOUS | Status: DC | PRN
  Administered 2011-12-16: 1 via TOPICAL

## 2011-12-16 MED ORDER — CEFAZOLIN SODIUM-DEXTROSE 2-3 GM-% IV SOLR
2.0000 g | Freq: Four times a day (QID) | INTRAVENOUS | Status: AC
  Administered 2011-12-16 (×2): 2 g via INTRAVENOUS
  Filled 2011-12-16 (×2): qty 50

## 2011-12-16 MED ORDER — PROPOFOL 10 MG/ML IV BOLUS
INTRAVENOUS | Status: DC | PRN
  Administered 2011-12-16: 200 mg via INTRAVENOUS

## 2011-12-16 MED ORDER — EPHEDRINE SULFATE 50 MG/ML IJ SOLN
INTRAMUSCULAR | Status: DC | PRN
  Administered 2011-12-16 (×2): 5 mg via INTRAVENOUS

## 2011-12-16 MED ORDER — METOCLOPRAMIDE HCL 5 MG/ML IJ SOLN: 5.0000 mg | Freq: Three times a day (TID) | INTRAMUSCULAR | Status: DC | PRN

## 2011-12-16 MED ORDER — DOCUSATE SODIUM 100 MG PO CAPS
100.0000 mg | ORAL_CAPSULE | Freq: Two times a day (BID) | ORAL | Status: DC
  Administered 2011-12-16 – 2011-12-18 (×5): 100 mg via ORAL

## 2011-12-16 MED ORDER — LACTATED RINGERS IV SOLN: INTRAVENOUS | Status: DC

## 2011-12-16 MED ORDER — LABETALOL HCL 5 MG/ML IV SOLN
INTRAVENOUS | Status: DC | PRN
  Administered 2011-12-16 (×2): 2.5 mg via INTRAVENOUS

## 2011-12-16 MED ORDER — BISACODYL 10 MG RE SUPP: 10.0000 mg | Freq: Every day | RECTAL | Status: DC | PRN

## 2011-12-16 MED ORDER — SUFENTANIL CITRATE 50 MCG/ML IV SOLN
INTRAVENOUS | Status: DC | PRN
  Administered 2011-12-16: 10 ug via INTRAVENOUS
  Administered 2011-12-16: 15 ug via INTRAVENOUS
  Administered 2011-12-16: 10 ug via INTRAVENOUS
  Administered 2011-12-16: 15 ug via INTRAVENOUS
  Administered 2011-12-16: 10 ug via INTRAVENOUS
  Administered 2011-12-16: 25 ug via INTRAVENOUS
  Administered 2011-12-16: 15 ug via INTRAVENOUS

## 2011-12-16 MED ORDER — HYDROMORPHONE HCL PF 1 MG/ML IJ SOLN: 0.5000 mg | INTRAMUSCULAR | Status: DC | PRN

## 2011-12-16 MED ORDER — HYDROCODONE-ACETAMINOPHEN 5-325 MG PO TABS
1.0000 | ORAL_TABLET | ORAL | Status: DC
  Administered 2011-12-16 – 2011-12-18 (×8): 1 via ORAL
  Administered 2011-12-18: 2 via ORAL
  Administered 2011-12-18: 1 via ORAL
  Filled 2011-12-16 (×3): qty 1
  Filled 2011-12-16: qty 2
  Filled 2011-12-16 (×6): qty 1

## 2011-12-16 MED ORDER — METHOCARBAMOL 500 MG PO TABS
500.0000 mg | ORAL_TABLET | Freq: Four times a day (QID) | ORAL | Status: DC | PRN
  Administered 2011-12-16 – 2011-12-18 (×5): 500 mg via ORAL
  Filled 2011-12-16 (×5): qty 1

## 2011-12-16 MED ORDER — MIDAZOLAM HCL 5 MG/5ML IJ SOLN
INTRAMUSCULAR | Status: DC | PRN
  Administered 2011-12-16: 2 mg via INTRAVENOUS

## 2011-12-16 MED ORDER — FERROUS SULFATE 325 (65 FE) MG PO TABS
325.0000 mg | ORAL_TABLET | Freq: Three times a day (TID) | ORAL | Status: DC
  Administered 2011-12-16 – 2011-12-18 (×5): 325 mg via ORAL
  Filled 2011-12-16 (×8): qty 1

## 2011-12-16 MED ORDER — DEXAMETHASONE SODIUM PHOSPHATE 10 MG/ML IJ SOLN
10.0000 mg | Freq: Once | INTRAMUSCULAR | Status: DC
  Filled 2011-12-16: qty 1

## 2011-12-16 MED ORDER — ACETAMINOPHEN 10 MG/ML IV SOLN
INTRAVENOUS | Status: AC
  Filled 2011-12-16: qty 100

## 2011-12-16 MED ORDER — FLEET ENEMA 7-19 GM/118ML RE ENEM: 1.0000 | ENEMA | Freq: Once | RECTAL | Status: AC | PRN

## 2011-12-16 MED ORDER — RIVAROXABAN 10 MG PO TABS
10.0000 mg | ORAL_TABLET | ORAL | Status: DC
Start: 2011-12-18 — End: 2011-12-18
  Administered 2011-12-18: 10 mg via ORAL
  Filled 2011-12-16 (×2): qty 1

## 2011-12-16 MED ORDER — CELECOXIB 200 MG PO CAPS
200.0000 mg | ORAL_CAPSULE | Freq: Two times a day (BID) | ORAL | Status: DC
  Administered 2011-12-16 – 2011-12-18 (×4): 200 mg via ORAL
  Filled 2011-12-16 (×6): qty 1

## 2011-12-16 MED ORDER — LIDOCAINE HCL 4 % MT SOLN
OROMUCOSAL | Status: DC | PRN
  Administered 2011-12-16: 4 mL via TOPICAL

## 2011-12-16 MED ORDER — DEXAMETHASONE SODIUM PHOSPHATE 10 MG/ML IJ SOLN: 10.0000 mg | Freq: Once | INTRAMUSCULAR | Status: DC

## 2011-12-16 MED ORDER — TRAMADOL HCL 50 MG PO TABS
50.0000 mg | ORAL_TABLET | Freq: Four times a day (QID) | ORAL | Status: DC | PRN
  Administered 2011-12-17: 50 mg via ORAL
  Filled 2011-12-16: qty 1

## 2011-12-16 MED ORDER — METOCLOPRAMIDE HCL 10 MG PO TABS: 5.0000 mg | ORAL_TABLET | Freq: Three times a day (TID) | ORAL | Status: DC | PRN

## 2011-12-16 MED ORDER — LACTATED RINGERS IV SOLN
INTRAVENOUS | Status: DC | PRN
  Administered 2011-12-16 (×3): via INTRAVENOUS

## 2011-12-16 MED ORDER — RIVAROXABAN 10 MG PO TABS
10.0000 mg | ORAL_TABLET | Freq: Once | ORAL | Status: AC
  Administered 2011-12-17: 10 mg via ORAL
  Filled 2011-12-16: qty 1

## 2011-12-16 MED ORDER — ACETAMINOPHEN 10 MG/ML IV SOLN
INTRAVENOUS | Status: DC | PRN
  Administered 2011-12-16: 1000 mg via INTRAVENOUS

## 2011-12-16 MED ORDER — 0.9 % SODIUM CHLORIDE (POUR BTL) OPTIME
TOPICAL | Status: DC | PRN
  Administered 2011-12-16: 1000 mL

## 2011-12-16 MED ORDER — DIPHENHYDRAMINE HCL 25 MG PO CAPS: 25.0000 mg | ORAL_CAPSULE | Freq: Four times a day (QID) | ORAL | Status: DC | PRN

## 2011-12-16 MED ORDER — DEXAMETHASONE SODIUM PHOSPHATE 10 MG/ML IJ SOLN
INTRAMUSCULAR | Status: DC | PRN
  Administered 2011-12-16: 10 mg via INTRAVENOUS

## 2011-12-16 MED ORDER — TAMOXIFEN CITRATE 10 MG PO TABS
20.0000 mg | ORAL_TABLET | Freq: Every day | ORAL | Status: DC
  Administered 2011-12-17 – 2011-12-18 (×2): 20 mg via ORAL
  Filled 2011-12-16 (×3): qty 2

## 2011-12-16 MED ORDER — ALUM & MAG HYDROXIDE-SIMETH 200-200-20 MG/5ML PO SUSP: 30.0000 mL | ORAL | Status: DC | PRN

## 2011-12-16 MED ORDER — LOSARTAN POTASSIUM 50 MG PO TABS
100.0000 mg | ORAL_TABLET | Freq: Every day | ORAL | Status: DC
  Administered 2011-12-16 – 2011-12-18 (×3): 100 mg via ORAL
  Filled 2011-12-16 (×3): qty 2

## 2011-12-16 MED ORDER — POLYETHYLENE GLYCOL 3350 17 G PO PACK: 17.0000 g | PACK | Freq: Two times a day (BID) | ORAL | Status: DC

## 2011-12-16 MED ORDER — SODIUM CHLORIDE 0.9 % IV SOLN
100.0000 mL/h | INTRAVENOUS | Status: AC
  Administered 2011-12-16 (×2): 100 mL/h via INTRAVENOUS
  Filled 2011-12-16 (×7): qty 1000

## 2011-12-16 MED ORDER — MENTHOL 3 MG MT LOZG: 1.0000 | LOZENGE | OROMUCOSAL | Status: DC | PRN

## 2011-12-16 MED ORDER — HYDROCHLOROTHIAZIDE 25 MG PO TABS
25.0000 mg | ORAL_TABLET | Freq: Every day | ORAL | Status: DC
  Administered 2011-12-16 – 2011-12-18 (×3): 25 mg via ORAL
  Filled 2011-12-16 (×3): qty 1

## 2011-12-16 MED ORDER — PROMETHAZINE HCL 25 MG/ML IJ SOLN: 6.2500 mg | INTRAMUSCULAR | Status: DC | PRN

## 2011-12-16 MED ORDER — ONDANSETRON HCL 4 MG PO TABS: 4.0000 mg | ORAL_TABLET | Freq: Four times a day (QID) | ORAL | Status: DC | PRN

## 2011-12-16 MED ORDER — LOSARTAN POTASSIUM-HCTZ 100-25 MG PO TABS: 1.0000 | ORAL_TABLET | Freq: Every day | ORAL | Status: DC

## 2011-12-16 MED ORDER — CEFAZOLIN SODIUM-DEXTROSE 2-3 GM-% IV SOLR
INTRAVENOUS | Status: AC
  Filled 2011-12-16: qty 50

## 2011-12-16 MED ORDER — LACTATED RINGERS IV SOLN
INTRAVENOUS | Status: DC
  Administered 2011-12-16 (×2): 1000 mL via INTRAVENOUS

## 2011-12-16 MED ORDER — LIDOCAINE HCL (CARDIAC) 20 MG/ML IV SOLN
INTRAVENOUS | Status: DC | PRN
  Administered 2011-12-16: 100 mg via INTRAVENOUS

## 2011-12-16 MED ORDER — ONDANSETRON HCL 4 MG/2ML IJ SOLN
INTRAMUSCULAR | Status: DC | PRN
  Administered 2011-12-16: 4 mg via INTRAVENOUS

## 2011-12-16 MED ORDER — ONDANSETRON HCL 4 MG/2ML IJ SOLN: 4.0000 mg | Freq: Four times a day (QID) | INTRAMUSCULAR | Status: DC | PRN

## 2011-12-16 SURGICAL SUPPLY — 40 items
BAG ZIPLOCK 12X15 (MISCELLANEOUS) ×4 IMPLANT
BLADE SAW SGTL 18X1.27X75 (BLADE) ×2 IMPLANT
CELLS DAT CNTRL 66122 CELL SVR (MISCELLANEOUS) ×1 IMPLANT
CLOTH BEACON ORANGE TIMEOUT ST (SAFETY) ×2 IMPLANT
DERMABOND ADVANCED (GAUZE/BANDAGES/DRESSINGS) ×1
DERMABOND ADVANCED .7 DNX12 (GAUZE/BANDAGES/DRESSINGS) ×1 IMPLANT
DRAPE C-ARM 42X72 X-RAY (DRAPES) ×2 IMPLANT
DRAPE STERI IOBAN 125X83 (DRAPES) ×2 IMPLANT
DRAPE U-SHAPE 47X51 STRL (DRAPES) ×6 IMPLANT
DRSG AQUACEL AG ADV 3.5X10 (GAUZE/BANDAGES/DRESSINGS) ×2 IMPLANT
DRSG TEGADERM 4X4.75 (GAUZE/BANDAGES/DRESSINGS) ×2 IMPLANT
DURAPREP 26ML APPLICATOR (WOUND CARE) ×2 IMPLANT
ELECT BLADE TIP CTD 4 INCH (ELECTRODE) ×2 IMPLANT
ELECT REM PT RETURN 9FT ADLT (ELECTROSURGICAL) ×2
ELECTRODE REM PT RTRN 9FT ADLT (ELECTROSURGICAL) ×1 IMPLANT
EVACUATOR 1/8 PVC DRAIN (DRAIN) ×2 IMPLANT
FACESHIELD LNG OPTICON STERILE (SAFETY) ×8 IMPLANT
GAUZE SPONGE 2X2 8PLY STRL LF (GAUZE/BANDAGES/DRESSINGS) ×1 IMPLANT
GLOVE BIOGEL PI IND STRL 7.5 (GLOVE) ×1 IMPLANT
GLOVE BIOGEL PI IND STRL 8 (GLOVE) ×1 IMPLANT
GLOVE BIOGEL PI INDICATOR 7.5 (GLOVE) ×1
GLOVE BIOGEL PI INDICATOR 8 (GLOVE) ×1
GLOVE ECLIPSE 8.0 STRL XLNG CF (GLOVE) ×2 IMPLANT
GLOVE ORTHO TXT STRL SZ7.5 (GLOVE) ×4 IMPLANT
GOWN BRE IMP PREV XXLGXLNG (GOWN DISPOSABLE) ×4 IMPLANT
GOWN STRL NON-REIN LRG LVL3 (GOWN DISPOSABLE) ×2 IMPLANT
KIT BASIN OR (CUSTOM PROCEDURE TRAY) ×2 IMPLANT
PACK TOTAL JOINT (CUSTOM PROCEDURE TRAY) ×2 IMPLANT
PADDING CAST COTTON 6X4 STRL (CAST SUPPLIES) ×2 IMPLANT
RTRCTR WOUND ALEXIS 18CM MED (MISCELLANEOUS) ×2
SPONGE GAUZE 2X2 STER 10/PKG (GAUZE/BANDAGES/DRESSINGS) ×1
SUCTION FRAZIER 12FR DISP (SUCTIONS) ×2 IMPLANT
SUT MNCRL AB 4-0 PS2 18 (SUTURE) ×2 IMPLANT
SUT VIC AB 1 CT1 36 (SUTURE) ×8 IMPLANT
SUT VIC AB 2-0 CT1 27 (SUTURE) ×2
SUT VIC AB 2-0 CT1 TAPERPNT 27 (SUTURE) ×2 IMPLANT
SUT VLOC 180 0 24IN GS25 (SUTURE) ×2 IMPLANT
TOWEL OR 17X26 10 PK STRL BLUE (TOWEL DISPOSABLE) ×4 IMPLANT
TRAY FOLEY CATH 14FRSI W/METER (CATHETERS) ×2 IMPLANT
WATER STERILE IRR 1500ML POUR (IV SOLUTION) ×4 IMPLANT

## 2011-12-16 NOTE — Interval H&P Note (Signed)
History and Physical Interval Note:  12/16/2011 7:15 AM  Kristen Escobar  has presented today for surgery, with the diagnosis of right hip osteoarthritis  The various methods of treatment have been discussed with the patient and family. After consideration of risks, benefits and other options for treatment, the patient has consented to  Procedure(s) (LRB): RIGHT TOTAL HIP ARTHROPLASTY ANTERIOR APPROACH (Right) as a surgical intervention .  [name] history has been reviewed, patient examined, no change in status, stable for surgery.  I have reviewed the patient's chart and labs.  Questions were answered to the patient's satisfaction.     Shelda Pal

## 2011-12-16 NOTE — Anesthesia Preprocedure Evaluation (Addendum)
Anesthesia Evaluation  Patient identified by MRN, date of birth, ID band Patient awake    Reviewed: Allergy & Precautions, H&P , NPO status , Patient's Chart, lab work & pertinent test results  Airway Mallampati: II TM Distance: >3 FB Neck ROM: Full    Dental  (+) Edentulous Upper, Poor Dentition, Dental Advisory Given and Partial Lower   Pulmonary former smoker,  breath sounds clear to auscultation  Pulmonary exam normal       Cardiovascular hypertension, Pt. on medications + Peripheral Vascular Disease Rhythm:Regular Rate:Normal     Neuro/Psych negative neurological ROS  negative psych ROS   GI/Hepatic negative GI ROS, Neg liver ROS,   Endo/Other  Morbid obesity  Renal/GU Renal InsufficiencyRenal disease  negative genitourinary   Musculoskeletal negative musculoskeletal ROS (+)   Abdominal   Peds negative pediatric ROS (+)  Hematology negative hematology ROS (+)   Anesthesia Other Findings   Reproductive/Obstetrics negative OB ROS History of Breast CA                          Anesthesia Physical Anesthesia Plan  ASA: II  Anesthesia Plan: General   Post-op Pain Management:    Induction: Intravenous  Airway Management Planned: Oral ETT  Additional Equipment:   Intra-op Plan:   Post-operative Plan: Extubation in OR  Informed Consent: I have reviewed the patients History and Physical, chart, labs and discussed the procedure including the risks, benefits and alternatives for the proposed anesthesia with the patient or authorized representative who has indicated his/her understanding and acceptance.   Dental advisory given  Plan Discussed with: CRNA  Anesthesia Plan Comments:         Anesthesia Quick Evaluation

## 2011-12-16 NOTE — Anesthesia Postprocedure Evaluation (Signed)
Anesthesia Post Note  Patient: Kristen Escobar  Procedure(s) Performed: Procedure(s) (LRB): TOTAL HIP ARTHROPLASTY ANTERIOR APPROACH (Right)  Anesthesia type: General  Patient location: PACU  Post pain: Pain level controlled  Post assessment: Post-op Vital signs reviewed  Last Vitals:  Filed Vitals:   12/16/11 1200  BP: 151/45  Pulse: 67  Temp: 36.8 C  Resp: 14    Post vital signs: Reviewed  Level of consciousness: sedated  Complications: No apparent anesthesia complications

## 2011-12-16 NOTE — Evaluation (Signed)
Physical Therapy Evaluation Patient Details Name: Kristen Escobar MRN: [id number] DOB: [birth year] Today's Date: 12/16/2011 Time: 8469-6295 PT Time Calculation (min): 32 min  PT Assessment / Plan / Recommendation Clinical Impression  Pt with L THR presents with decreased L LE strength/ROM and limitations in functional mobility    PT Assessment  Patient needs continued PT services    Follow Up Recommendations  Home health PT    Barriers to Discharge        Equipment Recommendations  Rolling walker with 5" wheels    Recommendations for Other Services OT consult   Frequency 7X/week    Precautions / Restrictions Precautions Precautions: None Restrictions Weight Bearing Restrictions: No Other Position/Activity Restrictions: WBAT   Pertinent Vitals/Pain 4/10 max      Mobility  Bed Mobility Bed Mobility: Supine to Sit Supine to Sit: 1: +2 Total assist Supine to Sit: Patient Percentage: 70% Details for Bed Mobility Assistance: Increased time with cues for sequence and self assist with L LE Transfers Transfers: Sit to Stand;Stand to Sit Sit to Stand: 1: +2 Total assist Sit to Stand: Patient Percentage: 70% Stand to Sit: 1: +2 Total assist Stand to Sit: Patient Percentage: 70% Details for Transfer Assistance: cues for use of UEs and for LE management Ambulation/Gait Ambulation/Gait Assistance: 1: +2 Total assist Ambulation/Gait: Patient Percentage: 70% Ambulation Distance (Feet): 68 Feet Assistive device: Rolling walker Ambulation/Gait Assistance Details: cues for posture, sequence, position from RW  Gait Pattern: Step-to pattern;Decreased step length - left;Decreased stance time - right    Exercises Total Joint Exercises Ankle Circles/Pumps: AROM;10 reps;Supine;Both Quad Sets: AROM;10 reps;Supine;Both Heel Slides: AAROM;10 reps;Supine;Right Hip ABduction/ADduction: AAROM;10 reps;Right;Supine   PT Diagnosis: Difficulty walking  PT Problem List: Decreased  strength;Decreased range of motion;Decreased activity tolerance;Decreased mobility;Decreased knowledge of use of DME;Obesity;Pain PT Treatment Interventions: DME instruction;Gait training;Stair training;Functional mobility training;Therapeutic activities;Therapeutic exercise;Patient/family education   PT Goals Acute Rehab PT Goals PT Goal Formulation: With patient Time For Goal Achievement: 12/23/11 Potential to Achieve Goals: Good Pt will go Supine/Side to Sit: with supervision PT Goal: Supine/Side to Sit - Progress: Goal set today Pt will go Sit to Supine/Side: with supervision PT Goal: Sit to Supine/Side - Progress: Goal set today Pt will go Sit to Stand: with supervision PT Goal: Sit to Stand - Progress: Goal set today Pt will go Stand to Sit: with supervision PT Goal: Stand to Sit - Progress: Goal set today Pt will Ambulate: >150 feet;with supervision;with rolling walker PT Goal: Ambulate - Progress: Goal set today Pt will Go Up / Down Stairs: 3-5 stairs;with min assist;with least restrictive assistive device PT Goal: Up/Down Stairs - Progress: Goal set today  Visit Information  Last PT Received On: 12/16/11 Assistance Needed: +2 (2* pt anxiety level)    Subjective Data  Subjective: I have to get up today? Patient Stated Goal: Get better and come back for the other side   Prior Functioning  Home Living Lives With: Spouse Available Help at Discharge: Family Type of Home: House Home Access: Stairs to enter Secretary/administrator of Steps: 5 Entrance Stairs-Rails: Right;Left Home Layout: Able to live on main level with bedroom/bathroom Home Adaptive Equipment: None Prior Function Level of Independence: Independent with assistive device(s) Able to Take Stairs?: Yes Communication Communication: No difficulties    Cognition  Overall Cognitive Status: Appears within functional limits for tasks assessed/performed Arousal/Alertness: Awake/alert Orientation Level: Appears  intact for tasks assessed Behavior During Session: [facility] for tasks performed    Extremity/Trunk Assessment Right Upper Extremity Assessment  RUE ROM/Strength/Tone: [facility] for tasks assessed Left Upper Extremity Assessment LUE ROM/Strength/Tone: WFL for tasks assessed Right Lower Extremity Assessment RLE ROM/Strength/Tone: Deficits RLE ROM/Strength/Tone Deficits: Hip strength 2+/5 with aarom to 15 abd and 75 flex at hip Left Lower Extremity Assessment LLE ROM/Strength/Tone: Deficits LLE ROM/Strength/Tone Deficits: strength WFL with hip AROM ltd to 80 flex and 10 abd   Balance    End of Session PT - End of Session Activity Tolerance: Patient tolerated treatment well Patient left: in chair;with call bell/phone within reach;with family/visitor present Nurse Communication: Mobility status  GP     Kristen Escobar 12/16/2011, 3:49 PM

## 2011-12-16 NOTE — Transfer of Care (Signed)
Immediate Anesthesia Transfer of Care Note  Patient: Kristen Escobar  Procedure(s) Performed: Procedure(s) (LRB): TOTAL HIP ARTHROPLASTY ANTERIOR APPROACH (Right)  Patient Location: PACU  Anesthesia Type: General  Level of Consciousness: awake, alert , oriented, patient cooperative and responds to stimulation  Airway & Oxygen Therapy: Patient Spontanous Breathing and Patient connected to face mask oxygen  Post-op Assessment: Report given to PACU RN, Post -op Vital signs reviewed and stable and Patient moving all extremities X 4  Post vital signs: stable  Complications: No apparent anesthesia complications and back pain

## 2011-12-16 NOTE — Care Management Note (Signed)
    Page 1 of 1   12/18/2011     12:30:57 PM   CARE MANAGEMENT NOTE 12/18/2011  Patient:  Kristen Escobar, Kristen Escobar   Account Number:  [id number]  Date Initiated:  12/16/2011  Documentation initiated by:  Lanier Clam  Subjective/Objective Assessment:   ADMITTED W/OA R HIP. R THA.     Action/Plan:   FROM HOME W/SPOUSE   Anticipated DC Date:  12/18/2011   Anticipated DC Plan:  HOME W HOME HEALTH SERVICES      DC Planning Services  CM consult      Choice offered to / List presented to:  C-1 Patient   DME arranged  WALKER - ROLLING  3-N-1      DME agency  [facility]     HH arranged  HH-2 PT      [facility] agency  [facility]   Status of service:  Completed, signed off Medicare Important Message given?   (If response is "NO", the following Medicare IM given date fields will be blank) Date Medicare IM given:   Date Additional Medicare IM given:    Discharge Disposition:  HOME W HOME HEALTH SERVICES  Per UR Regulation:  Reviewed for med. necessity/level of care/duration of stay  If discussed at Long Length of Stay Meetings, dates discussed:    Comments:  12/17/11 [facility] RN,BSN NCM [phone]

## 2011-12-16 NOTE — Progress Notes (Signed)
Utilization review completed.  

## 2011-12-16 NOTE — Op Note (Signed)
NAME:  Kristen Escobar                ACCOUNT NO.: [id number]      MEDICAL RECORD NO.: [id number]      FACILITY:  [facility]      PHYSICIAN:  Durene Romans D  DATE OF BIRTH:  1945-07-02     DATE OF PROCEDURE:  12/16/2011                                 OPERATIVE REPORT         PREOPERATIVE DIAGNOSIS: Right  hip osteoarthritis.      POSTOPERATIVE DIAGNOSIS:  Right hip osteoarthritis.      PROCEDURE:  Right total hip replacement through an anterior approach   utilizing DePuy THR system, component size 50mm pinnacle cup, a size 32+4 neutral   Altrex liner, a size 5 Hi Tri Lock stem with a 32+1 delta ceramic   ball.      SURGEON:  Madlyn Frankel. Charlann Boxer, M.D.      ASSISTANT:  Lanney Gins, PA      ANESTHESIA:  General.      SPECIMENS:  None.      COMPLICATIONS:  None.      BLOOD LOSS:  500 cc     DRAINS:  One Hemovac.      INDICATION OF THE PROCEDURE:  Kristen Escobar is a 66 y.o. female who had   presented to office for evaluation of right hip pain.  Radiographs revealed   progressive degenerative changes with bone-on-bone   articulation to the  hip joint.  The patient had painful limited range of   motion significantly affecting their overall quality of life.  The patient was failing to    respond to conservative measures, and at this point was ready   to proceed with more definitive measures.  The patient has noted progressive   degenerative changes in his hip, progressive problems and dysfunction   with regarding the hip prior to surgery.  Consent was obtained for   benefit of pain relief.  Specific risk of infection, DVT, component   failure, dislocation, need for revision surgery, as well discussion of   the anterior versus posterior approach were reviewed.  Consent was   obtained for benefit of anterior pain relief through an anterior   approach.      PROCEDURE IN DETAIL:  The patient was brought to operative theater.   Once adequate anesthesia,  preoperative antibiotics, 2gm Ancef administered.   The patient was positioned supine on the OSI Hanna table.  Once adequate   padding of boney process was carried out, we had predraped out the hip, and  used fluoroscopy to confirm orientation of the pelvis and position.      The right hip was then prepped and draped from proximal iliac crest to   mid thigh with shower curtain technique.      Time-out was performed identifying the patient, planned procedure, and   extremity.     An incision was then made 2 cm distal and lateral to the   anterior superior iliac spine extending over the orientation of the   tensor fascia lata muscle and sharp dissection was carried down to the   fascia of the muscle and protractor placed in the soft tissues.      The fascia was then incised.  The muscle belly was identified and swept  laterally and retractor placed along the superior neck.  Following   cauterization of the circumflex vessels and removing some pericapsular   fat, a second cobra retractor was placed on the inferior neck.  A third   retractor was placed on the anterior acetabulum after elevating the   anterior rectus.  A L-capsulotomy was along the line of the   superior neck to the trochanteric fossa, then extended proximally and   distally.  Tag sutures were placed and the retractors were then placed   intracapsular.  We then identified the trochanteric fossa and   orientation of my neck cut, confirmed this radiographically   and then made a neck osteotomy with the femur on traction.  The femoral   head was removed without difficulty or complication.  Traction was let   off and retractors were placed posterior and anterior around the   acetabulum.      The labrum and foveal tissue were debrided.  I began reaming with a 45mm   reamer and reamed up to 49mm reamer with good bony bed preparation and a 50   cup was chosen.  The final 50mm Pinnacle cup was then impacted under fluoroscopy  to  confirm the depth of penetration and orientation with respect to   abduction.  A screw was placed followed by the hole eliminator.  The final   32+4 Altrex liner was impacted with good visualized rim fit.  The cup was positioned anatomically within the acetabular portion of the pelvis.      At this point, the femur was rolled at 80 degrees.  Further capsule was   released off the inferior aspect of the femoral neck.  I then   released the superior capsule proximally.  The hook was placed laterally   along the femur and elevated manually and held in position with the bed   hook.  The leg was then extended and adducted with the leg rolled to 100   degrees of external rotation.  Once the proximal femur was fully   exposed, I used a box osteotome to set orientation.  I then began   broaching with the starting chili pepper broach and passed this by hand and then broached up to 5.  With the 5 broach in place I chose a high offset neck, and 32+1 ball, and did a trial reduction.  The offset was appropriate, leg lengths   appeared to be equal, confirmed radiographically.   Given these findings, I went ahead and dislocated the hip, repositioned all   retractors and positioned the right hip in the extended and abducted position.  The final 5 high offset Tri Lock stem was   chosen and it was impacted down to the level of neck cut.  Based on this   and the trial reduction, a 32+1 delta ceramic ball was chosen and   impacted onto a clean and dry trunnion, and the hip was reduced.  The   hip had been irrigated throughout the case again at this point.  I did   reapproximate the superior capsular leaflet to the anterior leaflet   using #1 Vicryl, placed a medium Hemovac drain deep.  The fascia of the   tensor fascia lata muscle was then reapproximated using #1 Vicryl.  The   remaining wound was closed with 2-0 Vicryl and running 4-0 Monocryl.   The hip was cleaned, dried, and dressed sterilely using Dermabond  and   Aquacel dressing.  Drain site dressed separately.  She was then brought   to recovery room in stable condition tolerating the procedure well.    Danae Orleans, PA-C was present for the entirety of the case involved from   preoperative positioning, perioperative retractor management, general   facilitation of the case, as well as primary wound closure as assistant.            Pietro Cassis Alvan Dame, M.D.            MDO/MEDQ  D:  02/11/2011  T:  02/11/2011  Job:  [id number]      Electronically Signed by Paralee Cancel M.D. on 02/17/2011 09:15:38 AM

## 2011-12-16 NOTE — Plan of Care (Signed)
Problem: Consults Goal: Diagnosis- Total Joint Replacement Right anterior total hip     

## 2011-12-17 LAB — BASIC METABOLIC PANEL
BUN: 16 mg/dL (ref 6–23)
Calcium: 8 mg/dL — ABNORMAL LOW (ref 8.4–10.5)
GFR calc Af Amer: 57 mL/min — ABNORMAL LOW (ref >90)
GFR calc non Af Amer: 49 mL/min — ABNORMAL LOW (ref >90)
Glucose, Bld: 138 mg/dL — ABNORMAL HIGH (ref 70–99)
Sodium: 134 mEq/L — ABNORMAL LOW (ref 135–145)

## 2011-12-17 LAB — CBC
HCT: 26.6 % — ABNORMAL LOW (ref 36.0–46.0)
Hemoglobin: 9 g/dL — ABNORMAL LOW (ref 12.0–15.0)
MCH: 27.9 pg (ref 26.0–34.0)
MCHC: 33.8 g/dL (ref 30.0–36.0)
RDW: 13.9 % (ref 11.5–15.5)

## 2011-12-17 NOTE — Clinical Documentation Improvement (Signed)
BMI DOCUMENTATION CLARIFICATION QUERY  THIS DOCUMENT IS NOT A PERMANENT PART OF THE MEDICAL RECORD  TO RESPOND TO THE THIS QUERY, FOLLOW THE INSTRUCTIONS BELOW:  1. If needed, update documentation for the patient's encounter via the notes activity.  2. Access this query again and click edit on the In [employer].  3. After updating, or not, click F2 to complete all highlighted (required) fields concerning your review. Select "additional documentation in the medical record" OR "no additional documentation provided".  4. Click Sign note button.  5. The deficiency will fall out of your In Basket *Please let us know if you are not able to complete this workflow by phone or e-mail (listed below).         12/17/11  Dear Humberto Leep PA Marton Redwood  In an effort to better capture your patient's severity of illness, reflect appropriate length of stay and utilization of resources, a review of the patient medical record has revealed the following indicators.    Based on your clinical judgment, please clarify and document in a progress note and/or discharge summary the clinical condition associated with the following supporting information:  In responding to this query please exercise your independent judgment.  The fact that a query is asked, does not imply that any particular answer is desired or expected.  According to HP pt "obese"  Pt's BMI=  41.02                 .    Please clarify whether or not BMI can be linked to one of the diagnoses listed below and document in pn  and d/c. Thank You!  [facility]: When linking BMI to a diagnosis please document both BMI and diagnosis together in pn for accuracy of severity of illness (SOI) and risk of mortality (ROM).    Possible Clinical conditions  Morbid Obesity W/ BMI=41.02  Underweight w/BMI=  Other condition___________________  Cannot Clinically determine _____________  Risk Factors: Sign & Symptoms: Weight: 239lbs Height  5'4" BMI= 41.02  Diagnostics: Lab:  Treatment Monitoring Reg diet   Reviewed: additional documentation in the medical record  Thank You,  Enis Slipper RN, BSN, MSN/Inf, CCDS Clinical Documentation Specialist [facility] Dept Pager: [phone] / E-mail: [email or web address]  Health Information Management [employer]

## 2011-12-17 NOTE — Progress Notes (Signed)
Physical Therapy Treatment Patient Details Name: Kristen Escobar MRN: [id number] DOB: 1945/07/14 Today's Date: 12/17/2011 Time: 4098-1191 PT Time Calculation (min): 26 min  PT Assessment / Plan / Recommendation Comments on Treatment Session       Follow Up Recommendations  Home health PT    Barriers to Discharge        Equipment Recommendations  Rolling walker with 5" wheels    Recommendations for Other Services OT consult  Frequency 7X/week   Plan Discharge plan remains appropriate    Precautions / Restrictions Precautions Precautions: None Restrictions Weight Bearing Restrictions: No Other Position/Activity Restrictions: WBAT   Pertinent Vitals/Pain Min c/o pain; pt premedicated, ice pack provided    Mobility  Transfers Transfers: Sit to Stand;Stand to Sit Sit to Stand: 5: Supervision;With armrests;From chair/3-in-1 Stand to Sit: 4: Min guard;With upper extremity assist;With armrests;To chair/3-in-1;To toilet Details for Transfer Assistance: min cues for hand placement and LE management. Ambulation/Gait Ambulation/Gait Assistance: 4: Min assist;4: Min Government social research officer (Feet): 150 Feet Assistive device: Rolling walker Ambulation/Gait Assistance Details: cues for posture and position from RW Gait Pattern: Step-to pattern;Step-through pattern    Exercises Total Joint Exercises Ankle Circles/Pumps: AROM;20 reps;Both;Supine Quad Sets: AROM;20 reps;Both;Supine Heel Slides: AAROM;20 reps;Right;Supine Hip ABduction/ADduction: AAROM;20 reps;Supine;Right Long [employer]: AROM;20 reps;Seated;Both   PT Diagnosis:    PT Problem List:   PT Treatment Interventions:     PT Goals Acute Rehab PT Goals PT Goal Formulation: With patient Time For Goal Achievement: 12/23/11 Potential to Achieve Goals: Good Pt will go Supine/Side to Sit: with supervision Pt will go Sit to Supine/Side: with supervision Pt will go Sit to Stand: with supervision PT Goal: Sit to Stand -  Progress: Progressing toward goal Pt will go Stand to Sit: with supervision PT Goal: Stand to Sit - Progress: Progressing toward goal Pt will Ambulate: >150 feet;with supervision;with rolling walker PT Goal: Ambulate - Progress: Progressing toward goal Pt will Go Up / Down Stairs: 3-5 stairs;with min assist;with least restrictive assistive device  Visit Information  Last PT Received On: 12/17/11 Assistance Needed: +1    Subjective Data  Subjective: I am feeling pretty good - I can't beleive how much it doesn't hurt Patient Stated Goal: Get better and come back for the other side   Cognition  Overall Cognitive Status: Appears within functional limits for tasks assessed/performed Arousal/Alertness: Awake/alert Orientation Level: Appears intact for tasks assessed Behavior During Session: [facility] for tasks performed    Balance     End of Session PT - End of Session Activity Tolerance: Patient tolerated treatment well Patient left: in chair;with call bell/phone within reach;with family/visitor present Nurse Communication: Mobility status   GP     Kristen Escobar 12/17/2011, 3:06 PM

## 2011-12-17 NOTE — Progress Notes (Signed)
Occupational Therapy Treatment Patient Details Name: Kristen Escobar MRN: [id number] DOB: May 24, 1945 Today's Date: 12/17/2011 Time: 1204-1228 OT Time Calculation (min): 24 min  OT Assessment / Plan / Recommendation Comments on Treatment Session      Follow Up Recommendations  No OT follow up    Barriers to Discharge       Equipment Recommendations  Rolling walker with 5" wheels    Recommendations for Other Services    Frequency     Plan      Precautions / Restrictions Precautions Precautions: None Restrictions Weight Bearing Restrictions: No Other Position/Activity Restrictions: WBAT   Pertinent Vitals/Pain 2/10 R hip    ADL  Lower Body Dressing: Performed;Minimal assistance (socks and underwear) Where Assessed - Lower Body Dressing: Supported sit to stand Toilet Transfer: Research scientist (life sciences) Method: Sit to Barista: Comfort height toilet Toileting - Architect and Hygiene: Performed;Supervision/safety Where Assessed - Engineer, mining and Hygiene: Sit to stand from 3-in-1 or toilet Transfers/Ambulation Related to ADLs: ambulated with supervision to the bathroom ADL Comments: Practiced with AE.  Other hip is also bad; pt's husband to get AE kit.  Pt steady to stand for shower if husband nearby, using long sponge and sitting on commode to wash bottom of feet    OT Diagnosis:    OT Problem List:   OT Treatment Interventions:     OT Goals ADL Goals Pt Will Perform Lower Body Dressing: Sit to stand from bed;Sit to stand from chair;with adaptive equipment ADL Goal: Lower Body Dressing - Progress: Progressing toward goals Pt Will Transfer to Toilet: with supervision;Regular height toilet;Ambulation ADL Goal: Toilet Transfer - Progress: Progressing toward goals Pt Will Perform Toileting - Clothing Manipulation: with supervision;Standing;Sitting on 3-in-1 or toilet ADL Goal: Toileting - Clothing  Manipulation - Progress: Met Pt Will Perform Toileting - Hygiene: with supervision;Sit to stand from 3-in-1/toilet ADL Goal: Toileting - Hygiene - Progress: Met  Visit Information  Last OT Received On: 12/17/11 Assistance Needed: +1    Subjective Data      Prior Functioning       Cognition  Overall Cognitive Status: Appears within functional limits for tasks assessed/performed Arousal/Alertness: Awake/alert Orientation Level: Appears intact for tasks assessed Behavior During Session: [facility] for tasks performed    Mobility  Shoulder Instructions Transfers Sit to Stand: 5: Supervision;With armrests;From chair/3-in-1 Stand to Sit: 4: Min guard;With upper extremity assist;With armrests;To chair/3-in-1;To toilet Details for Transfer Assistance: min cues for hand placement and LE management.       Exercises     Balance     End of Session OT - End of Session Activity Tolerance: Patient tolerated treatment well Patient left: in chair;with call bell/phone within reach;with family/visitor present  GO     Cristiano Capri 12/17/2011, 3:07 PM Marica Otter, OTR/L [phone] 12/17/2011

## 2011-12-17 NOTE — Progress Notes (Signed)
Physical Therapy Treatment Patient Details Name: Kristen Escobar MRN: [id number] DOB: 1945/10/30 Today's Date: 12/17/2011 Time: 4098-1191 PT Time Calculation (min): 25 min  PT Assessment / Plan / Recommendation Comments on Treatment Session  Reviewed car transfers with pt and spouse    Follow Up Recommendations  Home health PT    Barriers to Discharge        Equipment Recommendations  Rolling walker with 5" wheels    Recommendations for Other Services OT consult  Frequency 7X/week   Plan Discharge plan remains appropriate    Precautions / Restrictions Precautions Precautions: None Restrictions Weight Bearing Restrictions: No Other Position/Activity Restrictions: WBAT   Pertinent Vitals/Pain 3/10 with activity; pt declined pain meds prior to session, ice pack provided    Mobility  Bed Mobility Bed Mobility: Supine to Sit;Sit to Supine Supine to Sit: 5: Supervision;HOB elevated;With rails Sit to Supine: 5: Supervision Details for Bed Mobility Assistance: Min cues for hand placement and to self assist using LLE. Transfers Transfers: Sit to Stand;Stand to Sit Sit to Stand: 5: Supervision;With armrests;From chair/3-in-1 Stand to Sit: 5: Supervision Details for Transfer Assistance: min cues for hand placement and LE management. Ambulation/Gait Ambulation/Gait Assistance: 4: Min guard;5: Supervision Ambulation Distance (Feet): 175 Feet Assistive device: Rolling walker Ambulation/Gait Assistance Details: min cues for position from RW and posture Gait Pattern: Step-through pattern Stairs: Yes Stairs Assistance: 4: Min assist Stairs Assistance Details (indicate cue type and reason): cues for sequence and for foot/cane placement on stairs Stair Management Technique: One rail Left;Step to pattern;Forwards;With cane Number of Stairs: 4     Exercises Total Joint Exercises Ankle Circles/Pumps: AROM;20 reps;Both;Supine Quad Sets: AROM;20 reps;Both;Supine Heel Slides: AAROM;20  reps;Right;Supine Hip ABduction/ADduction: AAROM;20 reps;Supine;Right Long [employer]: AROM;20 reps;Seated;Both   PT Diagnosis:    PT Problem List:   PT Treatment Interventions:     PT Goals Acute Rehab PT Goals PT Goal Formulation: With patient Time For Goal Achievement: 12/23/11 Potential to Achieve Goals: Good Pt will go Supine/Side to Sit: with supervision PT Goal: Supine/Side to Sit - Progress: Progressing toward goal Pt will go Sit to Supine/Side: with supervision PT Goal: Sit to Supine/Side - Progress: Progressing toward goal Pt will go Sit to Stand: with supervision PT Goal: Sit to Stand - Progress: Progressing toward goal Pt will go Stand to Sit: with supervision PT Goal: Stand to Sit - Progress: Progressing toward goal Pt will Ambulate: >150 feet;with supervision;with rolling walker PT Goal: Ambulate - Progress: Progressing toward goal Pt will Go Up / Down Stairs: 3-5 stairs;with min assist;with least restrictive assistive device PT Goal: Up/Down Stairs - Progress: Progressing toward goal  Visit Information  Last PT Received On: 12/17/11 Assistance Needed: +1    Subjective Data  Subjective: I'm doing good Patient Stated Goal: Get better and come back for the other side   Cognition  Overall Cognitive Status: Appears within functional limits for tasks assessed/performed Arousal/Alertness: Awake/alert Orientation Level: Appears intact for tasks assessed Behavior During Session: [facility] for tasks performed    Balance     End of Session PT - End of Session Equipment Utilized During Treatment: Gait belt Activity Tolerance: Patient tolerated treatment well Patient left: in bed;with call bell/phone within reach Nurse Communication: Mobility status   GP     Kristen Escobar 12/17/2011, 3:12 PM

## 2011-12-17 NOTE — Evaluation (Signed)
Occupational Therapy Evaluation Patient Details Name: Kristen Escobar MRN: [id number] DOB: [birth year] Today's Date: 12/17/2011 Time: 7829-5621 OT Time Calculation (min): 20 min  OT Assessment / Plan / Recommendation Clinical Impression  Pt doing well POD 1 RTHR. Skilled OT recommended to maximize independence with BADLs to supervision level in prep for safe d/c home with HHOT.    OT Assessment  Patient needs continued OT Services    Follow Up Recommendations  No OT follow up    Barriers to Discharge      Equipment Recommendations  Rolling walker with 5" wheels    Recommendations for Other Services    Frequency  Min 2X/week    Precautions / Restrictions Precautions Precautions: None Restrictions Weight Bearing Restrictions: No   Pertinent Vitals/Pain     ADL  Grooming: Performed;Wash/dry hands;Min guard Where Assessed - Grooming: Supported standing Upper Body Bathing: Simulated;Set up Where Assessed - Upper Body Bathing: Unsupported sitting Lower Body Bathing: Simulated;Minimal assistance Where Assessed - Lower Body Bathing: Supported sit to stand Upper Body Dressing: Simulated;Set up Where Assessed - Upper Body Dressing: Unsupported sitting Lower Body Dressing: Simulated;Moderate assistance Where Assessed - Lower Body Dressing: Supported sit to stand Toilet Transfer: Performed;Min Pension scheme manager Method: Sit to Barista: Regular height toilet Toileting - Clothing Manipulation and Hygiene: Performed;Min guard Where Assessed - Engineer, mining and Hygiene: Sit to stand from 3-in-1 or toilet Tub/Shower Transfer: Performed;Min guard Tub/Shower Transfer Method: Science writer: Walk in Scientist, research (physical sciences) Used: Rolling walker;Gait belt Transfers/Ambulation Related to ADLs: Pt ambulated to the bathroom with minguard A. Educated how to safely step into walk in shower and where to obtain a shower seat/  bench. ADL Comments: Next session will focus on use of AE for LB ADLs.    OT Diagnosis: Generalized weakness  OT Problem List: Decreased activity tolerance;Decreased knowledge of use of DME or AE OT Treatment Interventions: Self-care/ADL training;Therapeutic activities;DME and/or AE instruction;Patient/family education   OT Goals Acute Rehab OT Goals OT Goal Formulation: With patient Time For Goal Achievement: 12/24/11 Potential to Achieve Goals: Good ADL Goals Pt Will Perform Lower Body Bathing: with supervision;Sit to stand from chair;Sit to stand from bed;with adaptive equipment ADL Goal: Lower Body Bathing - Progress: Goal set today Pt Will Perform Lower Body Dressing: Sit to stand from bed;Sit to stand from chair;with adaptive equipment ADL Goal: Lower Body Dressing - Progress: Goal set today Pt Will Transfer to Toilet: with supervision;Regular height toilet;Ambulation ADL Goal: Toilet Transfer - Progress: Goal set today Pt Will Perform Toileting - Clothing Manipulation: with supervision;Standing;Sitting on 3-in-1 or toilet ADL Goal: Toileting - Clothing Manipulation - Progress: Goal set today Pt Will Perform Toileting - Hygiene: with supervision;Sit to stand from 3-in-1/toilet ADL Goal: Toileting - Hygiene - Progress: Goal set today Pt Will Perform Tub/Shower Transfer: Shower transfer;with supervision;Ambulation ADL Goal: Tub/Shower Transfer - Progress: Goal set today  Visit Information  Last OT Received On: 12/17/11 Assistance Needed: +1    Subjective Data  Subjective: It only hurts when I move it. Patient Stated Goal: Just walking.   Prior Functioning  Vision/Perception  Home Living Lives With: Spouse Available Help at Discharge: Family;Available 24 hours/day Type of Home: House Home Access: Stairs to enter [employer] of Steps: 5 Entrance Stairs-Rails: Right;Left Home Layout: Able to live on main level with bedroom/bathroom Bathroom Shower/Tub: Architectural technologist: Standard Bathroom Accessibility: Yes How Accessible: Accessible via walker Prior Function Level of Independence: Independent Able to Take Stairs?: Yes  Driving: Yes Vocation: Retired Musician: No difficulties Dominant Hand: Right      Cognition  Overall Cognitive Status: Appears within functional limits for tasks assessed/performed Arousal/Alertness: Awake/alert Orientation Level: Appears intact for tasks assessed Behavior During Session: [facility] for tasks performed    Extremity/Trunk Assessment Right Upper Extremity Assessment RUE ROM/Strength/Tone: [facility] for tasks assessed Left Upper Extremity Assessment LUE ROM/Strength/Tone: WFL for tasks assessed   Mobility  Shoulder Instructions  Bed Mobility Supine to Sit: 5: Supervision;HOB elevated;With rails Details for Bed Mobility Assistance: Min cues for hand placement and to self assist using LLE. Transfers Sit to Stand: 4: Min guard;With upper extremity assist;From bed;From toilet Stand to Sit: 4: Min guard;With upper extremity assist;With armrests;To chair/3-in-1;To toilet Details for Transfer Assistance: min cues for hand placement and LE management.       Exercise     Balance     End of Session OT - End of Session Equipment Utilized During Treatment: Gait belt Activity Tolerance: Patient tolerated treatment well Patient left: in chair;with call bell/phone within reach  GO     Nikkol Pai A OTR/L [phone] 12/17/2011, 9:25 AM

## 2011-12-17 NOTE — Progress Notes (Addendum)
   Subjective: 1 Day Post-Op Procedure(s) (LRB): TOTAL HIP ARTHROPLASTY ANTERIOR APPROACH (Right)   Patient reports pain as mild, pain well controlled. No events throughout the night.   Objective:   VITALS:   Filed Vitals:   12/17/11 0440  BP: 140/42  Pulse: 66  Temp: 98.7 F (37.1 C)  Resp: 20    Neurovascular intact Dorsiflexion/Plantar flexion intact Incision: dressing C/D/I No cellulitis present Compartment soft  LABS  Basename 12/17/11 0345  HGB 9.0*  HCT 26.6*  WBC 10.3  PLT 238     Basename 12/17/11 0345  NA 134*  K 4.1  BUN 16  CREATININE 1.15*  GLUCOSE 138*     Assessment/Plan: 1 Day Post-Op Procedure(s) (LRB): TOTAL HIP ARTHROPLASTY ANTERIOR APPROACH (Right) HV drain d/c'ed Foley cath d/c'ed Advance diet Up with therapy D/C IV fluids Plan for discharge tomorrow to home, if she continues to do well.   ABLA  Treated with iron and will observe  Morbid Obesity (BMI >40)  Estimated Body mass index is 41.02 kg/(m^2) as calculated from the following:   Height as of this encounter: 5\' 4" (1.626 m).   Weight as of this encounter: 239 lb(108.41 kg). Patient also counseled that weight may inhibit the healing process Patient counseled that losing weight will help with future health issues   Kristen Escobar. Kristen Escobar   PAC  12/17/2011, 10:21 AM

## 2011-12-18 ENCOUNTER — Encounter (HOSPITAL_COMMUNITY): Payer: Self-pay | Admitting: Orthopedic Surgery

## 2011-12-18 LAB — CBC
MCH: 27.5 pg (ref 26.0–34.0)
MCHC: 32.9 g/dL (ref 30.0–36.0)
Platelets: 218 10*3/uL (ref 150–400)
RDW: 14.3 % (ref 11.5–15.5)

## 2011-12-18 LAB — BASIC METABOLIC PANEL
Calcium: 7.9 mg/dL — ABNORMAL LOW (ref 8.4–10.5)
GFR calc Af Amer: 52 mL/min — ABNORMAL LOW (ref >90)
GFR calc non Af Amer: 45 mL/min — ABNORMAL LOW (ref >90)
Potassium: 3.9 mEq/L (ref 3.5–5.1)
Sodium: 137 mEq/L (ref 135–145)

## 2011-12-18 MED ORDER — DSS 100 MG PO CAPS: 100.0000 mg | ORAL_CAPSULE | Freq: Two times a day (BID) | ORAL | Status: AC

## 2011-12-18 MED ORDER — RIVAROXABAN 10 MG PO TABS: 10.0000 mg | ORAL_TABLET | ORAL | Status: DC

## 2011-12-18 MED ORDER — HYDROCODONE-ACETAMINOPHEN 5-325 MG PO TABS: 1.0000 | ORAL_TABLET | ORAL | Status: AC | PRN

## 2011-12-18 MED ORDER — POLYETHYLENE GLYCOL 3350 17 G PO PACK
17.0000 g | PACK | Freq: Two times a day (BID) | ORAL | Status: AC
Start: 2011-12-18 — End: 2011-12-21

## 2011-12-18 MED ORDER — FERROUS SULFATE 325 (65 FE) MG PO TABS: 325.0000 mg | ORAL_TABLET | Freq: Three times a day (TID) | ORAL | Status: DC

## 2011-12-18 MED ORDER — DIPHENHYDRAMINE HCL 25 MG PO CAPS: 25.0000 mg | ORAL_CAPSULE | Freq: Four times a day (QID) | ORAL | Status: DC | PRN

## 2011-12-18 MED ORDER — METHOCARBAMOL 500 MG PO TABS: 500.0000 mg | ORAL_TABLET | Freq: Four times a day (QID) | ORAL | Status: AC | PRN

## 2011-12-18 MED ORDER — ASPIRIN EC 325 MG PO TBEC: 325.0000 mg | DELAYED_RELEASE_TABLET | Freq: Two times a day (BID) | ORAL | Status: AC

## 2011-12-18 NOTE — Progress Notes (Signed)
Physical Therapy Treatment Patient Details Name: Kristen Escobar MRN: [id number] DOB: 1945-08-18 Today's Date: 12/18/2011 Time: 1478-2956 PT Time Calculation (min): 25 min  PT Assessment / Plan / Recommendation Comments on Treatment Session       Follow Up Recommendations  Home health PT    Barriers to Discharge        Equipment Recommendations  Rolling walker with 5" wheels    Recommendations for Other Services OT consult  Frequency 7X/week   Plan Discharge plan remains appropriate    Precautions / Restrictions Precautions Precautions: None Restrictions Weight Bearing Restrictions: No Other Position/Activity Restrictions: WBAT   Pertinent Vitals/Pain 2-3/10; pt premedicated    Mobility  Transfers Transfers: Sit to Stand;Stand to Sit Sit to Stand: 5: Supervision Stand to Sit: 5: Supervision Details for Transfer Assistance: min cues for hand placement and LE management. Ambulation/Gait Ambulation/Gait Assistance: 5: Supervision Ambulation Distance (Feet): 150 Feet Assistive device: Rolling walker Ambulation/Gait Assistance Details: min cues for position from RW Gait Pattern: Step-through pattern Stairs: Yes Stairs Assistance: 4: Min assist Stairs Assistance Details (indicate cue type and reason): cues for sequence and cane/foot placement; husband present Stair Management Technique: One rail Left;Step to pattern;Forwards;With cane    Exercises Total Joint Exercises Ankle Circles/Pumps: AROM;20 reps;Both;Supine Quad Sets: AROM;20 reps;Both;Supine Gluteal Sets: AROM;20 reps;Supine;Both Heel Slides: AAROM;20 reps;Right;Supine Hip ABduction/ADduction: AAROM;20 reps;Supine;Right Long [employer]: AROM;20 reps;Seated;Both   PT Diagnosis:    PT Problem List:   PT Treatment Interventions:     PT Goals Acute Rehab PT Goals PT Goal Formulation: With patient Time For Goal Achievement: 12/23/11 Potential to Achieve Goals: Good Pt will go Supine/Side to Sit: with  supervision PT Goal: Supine/Side to Sit - Progress: Met Pt will go Sit to Supine/Side: with supervision PT Goal: Sit to Supine/Side - Progress: Met Pt will go Sit to Stand: with supervision PT Goal: Sit to Stand - Progress: Met Pt will go Stand to Sit: with supervision PT Goal: Stand to Sit - Progress: Met Pt will Ambulate: >150 feet;with supervision;with rolling walker PT Goal: Ambulate - Progress: Met Pt will Go Up / Down Stairs: 3-5 stairs;with min assist;with least restrictive assistive device PT Goal: Up/Down Stairs - Progress: Met  Visit Information  Last PT Received On: 12/18/11 Assistance Needed: +1    Subjective Data  Subjective: I am going home today Patient Stated Goal: Get better and come back for the other side   Cognition  Overall Cognitive Status: Appears within functional limits for tasks assessed/performed Arousal/Alertness: Awake/alert Orientation Level: Appears intact for tasks assessed Behavior During Session: [facility] for tasks performed    Balance     End of Session PT - End of Session Equipment Utilized During Treatment: Gait belt Activity Tolerance: Patient tolerated treatment well Patient left: with call bell/phone within reach;in chair;with family/visitor present Nurse Communication: Mobility status   GP     Kristen Escobar 12/18/2011, 12:44 PM

## 2011-12-18 NOTE — Progress Notes (Signed)
   Subjective: 2 Days Post-Op Procedure(s) (LRB): TOTAL HIP ARTHROPLASTY ANTERIOR APPROACH (Right)   Patient reports pain as mild, pain well controlled. No events throughout the night. Ready to be discharged home.  Objective:   VITALS:   Filed Vitals:   12/18/11 0520  BP: 152/59  Pulse: 70  Temp: 98 F (36.7 C)  Resp: 18    Neurovascular intact Dorsiflexion/Plantar flexion intact Incision: dressing C/D/I No cellulitis present Compartment soft  LABS  Basename 12/18/11 0418 12/17/11 0345  HGB 8.5* 9.0*  HCT 25.8* 26.6*  WBC 8.4 10.3  PLT 218 238     Basename 12/18/11 0418 12/17/11 0345  NA 137 134*  K 3.9 4.1  BUN 19 16  CREATININE 1.24* 1.15*  GLUCOSE 111* 138*     Assessment/Plan: 2 Days Post-Op Procedure(s) (LRB): TOTAL HIP ARTHROPLASTY ANTERIOR APPROACH (Right)   Up with therapy Discharge home with home health Follow up in 2 weeks at [facility].  Follow-up Information    Follow up with OLIN,Joshus Rogan D in 2 weeks.   Contact information:   [facility] [address] [place] [address] [phone]          Anastasio Auerbach. Wren Pryce   PAC  12/18/2011, 8:59 AM

## 2011-12-20 DIAGNOSIS — M169 Osteoarthritis of hip, unspecified: Secondary | ICD-10-CM | POA: Diagnosis not present

## 2011-12-20 DIAGNOSIS — Z471 Aftercare following joint replacement surgery: Secondary | ICD-10-CM | POA: Diagnosis not present

## 2011-12-20 DIAGNOSIS — D62 Acute posthemorrhagic anemia: Secondary | ICD-10-CM | POA: Diagnosis not present

## 2011-12-20 DIAGNOSIS — IMO0001 Reserved for inherently not codable concepts without codable children: Secondary | ICD-10-CM | POA: Diagnosis not present

## 2011-12-20 DIAGNOSIS — Z96649 Presence of unspecified artificial hip joint: Secondary | ICD-10-CM | POA: Diagnosis not present

## 2011-12-22 NOTE — Discharge Summary (Signed)
Physician Discharge Summary  Patient ID: Kristen Escobar MRN: [id number] DOB/AGE: 65-09-1945 65 y.o.  Admit date: 12/16/2011 Discharge date: 12/18/2011  Procedures:  Procedure(s) (LRB): TOTAL HIP ARTHROPLASTY ANTERIOR APPROACH (Right)  Attending Physician:  Dr. Durene Romans   Admission Diagnoses:   End-stage osteoarthritis, right hip  Discharge Diagnoses:  Principal Problem:  *S/P right THA, AA Active Problems:  Expected blood loss anemia HTN History of breast cancer History of Buerger disease   HPI: This is a 66 year old lady with end-stage osteoarthritis of right hip that has failed conservative treatment. After discussion of treatment, benefits, risks, and options, the patient is now scheduled for total hip arthroplasty by anterior approach of the right hip. Note that the patient is not a candidate for tranexamic acid, but she is a candidate for dexamethasone and will receive dexamethasone at surgery. 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 With her breast history and history of Buerger disease, we will put her on Xarelto 10 mg every other day for 2 weeks postop and then switch to aspirin for her DVT prophylaxis. Her medical doctor is Dr. Eleonore Chiquito; specialty physician Dr. Drue Second. She is planning on going home after surgery, and she is given her home medicines of Xarelto 10 mg every day for 2 weeks postop and then aspirin one b.i.d. for another 4 weeks postop. Also the Robaxin, iron, MiraLAX, and Colace.  PCP: [name], MD   Discharged Condition: good  Hospital Course:  Patient underwent the above stated procedure on 12/16/2011. Patient tolerated the procedure well and brought to the recovery room in good condition and subsequently to the floor.  POD #1 BP: 140/42 ; Pulse: 66 ; Temp: 98.7 F (37.1 C) ; Resp: 20 Pt's foley was  removed, as well as the hemovac drain removed. IV was changed to a saline lock. Patient reports pain as mild, pain well controlled. No events throughout the night.  Neurovascular intact, dorsiflexion/plantar flexion intact, incision: dressing C/D/I, no cellulitis present and compartment soft.   LABS  Basename  12/17/11 0345   HGB  9.0  HCT  26.6   POD #2  BP: 152/59 ; Pulse: 70 ; Temp: 98 F (36.7 C) ; Resp: 18  Patient reports pain as mild, pain well controlled. No events throughout the night. Ready to be discharged home. Neurovascular intact, dorsiflexion/plantar flexion intact, incision: dressing C/D/I, no cellulitis present and compartment soft.   LABS  Basename  12/18/11 0418   HGB  8.5  HCT  25.8    Discharge Exam: General appearance: alert, cooperative and no distress Extremities: Homans sign is negative, no sign of DVT, no edema, redness or tenderness in the calves or thighs and no ulcers, gangrene or trophic changes  Disposition:  Home with follow up in 2 weeks     Discharge Orders    Future Appointments: Provider: Department: Dept Phone: Center:   03/10/2012 9:00 AM Krista Blue [facility] [phone] None   03/10/2012 9:30 AM Victorino December, MD [facility] [phone] None     Future Orders Please Complete By Expires   Diet - low sodium heart healthy      Call MD / Call 911      Comments:   If you experience chest pain or shortness of breath, CALL 911 and be transported to the hospital emergency room.  If you develope a fever  above 101 F, pus (white drainage) or increased drainage or redness at the wound, or calf pain, call your surgeon's office.   Discharge instructions      Comments:   Maintain surgical dressing for 8 days, then replace with gauze and tape. Keep the area dry and clean until follow up. Follow up in 2 weeks at [facility]. Call with any questions or concerns.   Constipation Prevention      Comments:   Drink plenty  of fluids.  Prune juice may be helpful.  You may use a stool softener, such as Colace (over the counter) 100 mg twice a day.  Use MiraLax (over the counter) for constipation as needed.   Increase activity slowly as tolerated      Change dressing      Comments:   Maintain surgical dressing for 8 days, then replace with 4x4 guaze and tape. Keep the area dry and clean.   TED hose      Comments:   Use stockings (TED hose) for 2 weeks on both leg(s).  You may remove them at night for sleeping.      Discharge Medication List as of 12/18/2011 10:13 AM    START taking these medications   Details  aspirin EC 325 MG tablet Take 1 tablet (325 mg total) by mouth 2 (two) times daily. X 4 weeks, Starting 12/18/2011, Until Sun 12/28/11, No Print    diphenhydrAMINE (BENADRYL) 25 mg capsule Take 1 capsule (25 mg total) by mouth every 6 (six) hours as needed for itching, allergies or sleep., Starting 12/18/2011, Until Sun 12/28/11, No Print    docusate sodium 100 MG CAPS Take 100 mg by mouth 2 (two) times daily., Starting 12/18/2011, Until Sun 12/28/11, No Print    ferrous sulfate 325 (65 FE) MG tablet Take 1 tablet (325 mg total) by mouth 3 (three) times daily after meals., Starting 12/18/2011, Until Fri 12/17/12, No Print    HYDROcodone-acetaminophen (NORCO/VICODIN) 5-325 MG per tablet Take 1-2 tablets by mouth every 4 (four) hours as needed for pain., Starting 12/18/2011, Until Sun 12/28/11, Print    methocarbamol (ROBAXIN) 500 MG tablet Take 1 tablet (500 mg total) by mouth every 6 (six) hours as needed (muscle spasms)., Starting 12/18/2011, Until Sun 12/28/11, No Print    polyethylene glycol (MIRALAX / GLYCOLAX) packet Take 17 g by mouth 2 (two) times daily., Starting 12/18/2011, Until Sun 12/21/11, No Print    rivaroxaban (XARELTO) 10 MG TABS tablet Take 1 tablet (10 mg total) by mouth daily., Starting 12/18/2011, Until Discontinued, No Print      CONTINUE these medications which have NOT CHANGED   Details    losartan-hydrochlorothiazide (HYZAAR) 100-25 MG per tablet Take 1 tablet by mouth daily before breakfast., Starting 09/24/2011, Until Discontinued, Historical Med    tamoxifen (NOLVADEX) 20 MG tablet Take 20 mg by mouth daily before breakfast., Starting 09/11/2011, Until Discontinued, Historical Med      STOP taking these medications     ibuprofen (ADVIL,MOTRIN) 200 MG tablet Comments:  Reason for Stopping:       traMADol (ULTRAM) 50 MG tablet Comments:  Reason for Stopping:           Signed: Anastasio Auerbach. Tiffaney Heimann   PAC  12/22/2011, 6:59 PM

## 2011-12-24 DIAGNOSIS — Z471 Aftercare following joint replacement surgery: Secondary | ICD-10-CM | POA: Diagnosis not present

## 2011-12-24 DIAGNOSIS — Z96649 Presence of unspecified artificial hip joint: Secondary | ICD-10-CM | POA: Diagnosis not present

## 2011-12-24 DIAGNOSIS — M169 Osteoarthritis of hip, unspecified: Secondary | ICD-10-CM | POA: Diagnosis not present

## 2011-12-24 DIAGNOSIS — D62 Acute posthemorrhagic anemia: Secondary | ICD-10-CM | POA: Diagnosis not present

## 2011-12-24 DIAGNOSIS — IMO0001 Reserved for inherently not codable concepts without codable children: Secondary | ICD-10-CM | POA: Diagnosis not present

## 2011-12-26 DIAGNOSIS — IMO0001 Reserved for inherently not codable concepts without codable children: Secondary | ICD-10-CM | POA: Diagnosis not present

## 2011-12-26 DIAGNOSIS — Z96649 Presence of unspecified artificial hip joint: Secondary | ICD-10-CM | POA: Diagnosis not present

## 2011-12-26 DIAGNOSIS — Z471 Aftercare following joint replacement surgery: Secondary | ICD-10-CM | POA: Diagnosis not present

## 2011-12-26 DIAGNOSIS — M169 Osteoarthritis of hip, unspecified: Secondary | ICD-10-CM | POA: Diagnosis not present

## 2011-12-26 DIAGNOSIS — D62 Acute posthemorrhagic anemia: Secondary | ICD-10-CM | POA: Diagnosis not present

## 2011-12-30 DIAGNOSIS — M169 Osteoarthritis of hip, unspecified: Secondary | ICD-10-CM | POA: Diagnosis not present

## 2011-12-30 DIAGNOSIS — IMO0001 Reserved for inherently not codable concepts without codable children: Secondary | ICD-10-CM | POA: Diagnosis not present

## 2011-12-30 DIAGNOSIS — D62 Acute posthemorrhagic anemia: Secondary | ICD-10-CM | POA: Diagnosis not present

## 2011-12-30 DIAGNOSIS — Z471 Aftercare following joint replacement surgery: Secondary | ICD-10-CM | POA: Diagnosis not present

## 2011-12-30 DIAGNOSIS — Z96649 Presence of unspecified artificial hip joint: Secondary | ICD-10-CM | POA: Diagnosis not present

## 2011-12-31 DIAGNOSIS — M169 Osteoarthritis of hip, unspecified: Secondary | ICD-10-CM | POA: Diagnosis not present

## 2011-12-31 DIAGNOSIS — Z96649 Presence of unspecified artificial hip joint: Secondary | ICD-10-CM | POA: Diagnosis not present

## 2011-12-31 DIAGNOSIS — Z471 Aftercare following joint replacement surgery: Secondary | ICD-10-CM | POA: Diagnosis not present

## 2011-12-31 DIAGNOSIS — D62 Acute posthemorrhagic anemia: Secondary | ICD-10-CM | POA: Diagnosis not present

## 2011-12-31 DIAGNOSIS — IMO0001 Reserved for inherently not codable concepts without codable children: Secondary | ICD-10-CM | POA: Diagnosis not present

## 2012-01-02 DIAGNOSIS — D62 Acute posthemorrhagic anemia: Secondary | ICD-10-CM | POA: Diagnosis not present

## 2012-01-02 DIAGNOSIS — M169 Osteoarthritis of hip, unspecified: Secondary | ICD-10-CM | POA: Diagnosis not present

## 2012-01-02 DIAGNOSIS — IMO0001 Reserved for inherently not codable concepts without codable children: Secondary | ICD-10-CM | POA: Diagnosis not present

## 2012-01-02 DIAGNOSIS — Z471 Aftercare following joint replacement surgery: Secondary | ICD-10-CM | POA: Diagnosis not present

## 2012-01-02 DIAGNOSIS — Z96649 Presence of unspecified artificial hip joint: Secondary | ICD-10-CM | POA: Diagnosis not present

## 2012-01-13 ENCOUNTER — Encounter (HOSPITAL_COMMUNITY): Payer: Self-pay | Admitting: Pharmacy Technician

## 2012-01-14 ENCOUNTER — Encounter (HOSPITAL_COMMUNITY)
Admission: RE | Admit: 2012-01-14 | Discharge: 2012-01-14 | Disposition: A | Discharge status: Home / Self Care | Payer: Medicare Other | Source: Ambulatory Visit | Attending: Orthopedic Surgery | Admitting: Orthopedic Surgery

## 2012-01-14 ENCOUNTER — Encounter (HOSPITAL_COMMUNITY): Payer: Self-pay

## 2012-01-14 HISTORY — DX: Unspecified osteoarthritis, unspecified site: M19.90

## 2012-01-14 LAB — CBC
HCT: 35.5 % — ABNORMAL LOW (ref 36.0–46.0)
MCH: 27 pg (ref 26.0–34.0)
MCHC: 32.1 g/dL (ref 30.0–36.0)
MCV: 83.9 fL (ref 78.0–100.0)
RDW: 14.5 % (ref 11.5–15.5)
WBC: 7.8 10*3/uL (ref 4.0–10.5)

## 2012-01-14 LAB — BASIC METABOLIC PANEL
BUN: 29 mg/dL — ABNORMAL HIGH (ref 6–23)
CO2: 26 mEq/L (ref 19–32)
Chloride: 103 mEq/L (ref 96–112)
Creatinine, Ser: 1.14 mg/dL — ABNORMAL HIGH (ref 0.50–1.10)
Glucose, Bld: 103 mg/dL — ABNORMAL HIGH (ref 70–99)

## 2012-01-14 LAB — URINALYSIS, ROUTINE W REFLEX MICROSCOPIC
Glucose, UA: NEGATIVE mg/dL
Ketones, ur: NEGATIVE mg/dL
pH: 6.5 (ref 5.0–8.0)

## 2012-01-14 LAB — URINE MICROSCOPIC-ADD ON

## 2012-01-14 NOTE — Pre-Procedure Instructions (Signed)
Chest x ray 8/13, EKG 6/13 EPIC

## 2012-01-14 NOTE — Progress Notes (Signed)
01/14/12 1008  OBSTRUCTIVE SLEEP APNEA  Have you ever been diagnosed with sleep apnea through a sleep study? No  Do you snore loudly (loud enough to be heard through closed doors)?  0  Do you often feel tired, fatigued, or sleepy during the daytime? 1  Has anyone observed you stop breathing during your sleep? 0  Do you have, or are you being treated for high blood pressure? 1  BMI more than 35 kg/m2? 1  Age over 65 years old? 1  Neck circumference greater than 40 cm/18 inches? 0  Gender: 0  Obstructive Sleep Apnea Score 4   Score 4 or greater  Results sent to PCP

## 2012-01-14 NOTE — Patient Instructions (Addendum)
20 Kristen Escobar  01/14/2012   Your procedure is scheduled on:  01/20/12  Surgery 4PM-530PM  TUESDAY  Report to [facility] at    130PM Call this number if you have problems the morning of surgery: [phone]     Or PST   [id number]  [facility]   Remember:   Do not eat food:After Midnight.MONDAY NIGHT  May have clear liquids: UNTIL 0730AM  Tuesday MORNING  THEN NONE  Clear liquids include soda, tea, black coffee, apple or grape juice, broth.  Take these medicines the morning of surgery with A SIP OF WATER: Tamofixen   Do not wear jewelry, make-up or nail polish.  Do not wear lotions, powders, or perfumes. You may wear deodorant.  Do not shave 48 hours prior to surgery.  Do not bring valuables to the hospital.  Contacts, dentures or bridgework may not be worn into surgery.  Leave suitcase in the car. After surgery it may be brought to your room.  For patients admitted to the hospital, checkout time is 11:00 AM the day of discharge.   Patients discharged the day of surgery will not be allowed to drive home.  Name and phone number of your driver:   Leone Haven                                                                   Special Instructions: CHG Shower Use Special Wash:  SEE ADDITIONAL INSTRUCTION SHEET REGARDING SHOWERS,. REGULAR SOAP FACE AND PRIVATES              LADIES- NO SHAVING 48 HOURS BEFORE USING BETASEPT SOAP.                 Please read over the following fact sheets that you were given: MRSA Information

## 2012-01-18 NOTE — H&P (Signed)
Kristen Escobar is an 66 y.o. female.    Chief Complaint:  Left hip OA and pain   HPI: This is a 66 year old lady with end-stage osteoarthritis of left hip that has failed conservative treatment. X-rays show end-stage OA changes of the left hip.  After discussion of treatment, benefits, risks, and options, the patient is now scheduled for total hip arthroplasty by anterior approach of the left hip. Note that the patient is not a candidate for tranexamic acid, but she is a candidate for dexamethasone and will receive dexamethasone at surgery. 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 With her breast history and history of Buerger disease, we will put her on Xarelto 10 mg every other day for 2 weeks postop and then switch to aspirin for her DVT prophylaxis. Her medical doctor is Dr. Eleonore Escobar; specialty physician Dr. Drue Escobar. She is planning on going home after surgery   PCP:  Kristen Boga, MD  D/C Plans:  Home with HHPT  Post-op Meds:  No Rx given - Will nee to be on Xarelto for 2 weeks, then ASA.  Tranexamic Acid:   Not to be given  Decadron:   To be given  PMH: Past Medical History  Diagnosis Date  . Hypertension   . Obesity   . Cancer 03/2009    breast- rt  . Peripheral vascular disease 1995    PT DEVELOPED CIRCULATION PROBLEMS IN BOTH HANDS AND GANGRENE OF BOTH INDEX FINGERS--REQUIRING AMPUTATION OF THE INDEX FINGERS AND VASCULAR SURGERY.  PT TOLD HER PROBLEMS RELATED TO SMOKING.   NO OTHER PROBLEMS SINCE.  Marland Kitchen Arthritis     PSH: Past Surgical History  Procedure Date  . Tubal ligation   . Amputation     partial amputation of both index fingers  . Vascular surgery     both hands  . Total hip arthroplasty 12/16/2011    right hip  . Breast surgery 2011    lumpectomy with node sampling- RIGHT    Social History:  reports that she  quit smoking about 18 years ago. She has never used smokeless tobacco. She reports that she drinks alcohol. She reports that she does not use illicit drugs.  Allergies:  No Known Allergies  Medications: No current facility-administered medications for this encounter.   Current Outpatient Prescriptions  Medication Sig Dispense Refill  . aspirin 325 MG EC tablet Take 325 mg by mouth every morning.      . diphenhydrAMINE (BENADRYL) 25 mg capsule Take 25 mg by mouth every 6 (six) hours as needed. Denies use 01/14/12      . losartan-hydrochlorothiazide (HYZAAR) 100-25 MG per tablet Take 1 tablet by mouth daily before breakfast.      . Multiple Vitamin (MULTIVITAMIN WITH MINERALS) TABS Take 1 tablet by mouth daily.      . tamoxifen (NOLVADEX) 20 MG tablet Take 20 mg by mouth daily before breakfast.        ROS: Review of Systems  Constitutional: Negative.   HENT: Negative.   Eyes: Negative.   Respiratory: Negative.   Cardiovascular: Negative.   Gastrointestinal: Negative.   Genitourinary: Negative.   Musculoskeletal: Positive for joint pain.  Skin: Negative.   Neurological: Negative.   Endo/Heme/Allergies: Negative.   Psychiatric/Behavioral: Negative.      Physical Exam: BP:   140/70  ;  HR:   70  ; Resp:  14  ; Physical Exam  Constitutional: She is oriented to person, place, and time and well-developed, well-nourished, and in no distress.  HENT:  Head: Normocephalic and atraumatic.  Nose: Nose normal.  Mouth/Throat: Oropharynx is clear and moist.  Eyes: Pupils are equal, round, and reactive to light.  Neck: Neck supple. No JVD present. No tracheal deviation present. No thyromegaly present.  Cardiovascular: Normal rate, regular rhythm, normal heart sounds and intact distal pulses.   Pulmonary/Chest: Effort normal and breath sounds normal. No stridor. No respiratory distress. She has no wheezes.  Abdominal: Soft. There is no tenderness. There is no guarding.  Musculoskeletal:         Left hip: She exhibits decreased range of motion, decreased strength, tenderness and bony tenderness. She exhibits no swelling, no deformity and no laceration.  Lymphadenopathy:    She has no cervical adenopathy.  Neurological: She is alert and oriented to person, place, and time.  Skin: Skin is warm and dry.  Psychiatric: Affect normal.     Assessment/Plan Assessment:   Left hip OA and pain   Plan: Patient will undergo a left total hip arthroplasty, anterior approach on 01/20/2012 per Dr. Charlann Escobar at [facility]. Risks benefits and expectations were discussed with the patient. Patient understand risks, benefits and expectations and wishes to proceed.   Kristen Escobar   PAC  01/18/2012, 10:40 PM

## 2012-01-19 NOTE — Progress Notes (Signed)
Spoke with pt by phoen aware surgery time changed to 1535, clear liquids midnight until 0700 am then nothing by mouth ariive  1300pm [facility] short stay 01-20-2012

## 2012-01-20 ENCOUNTER — Encounter (HOSPITAL_COMMUNITY)
Admission: RE | Disposition: A | Discharge status: Home Health | Payer: Self-pay | Source: Ambulatory Visit | Attending: Orthopedic Surgery

## 2012-01-20 ENCOUNTER — Ambulatory Visit (HOSPITAL_COMMUNITY): Payer: Medicare Other

## 2012-01-20 ENCOUNTER — Ambulatory Visit (HOSPITAL_COMMUNITY): Payer: Medicare Other | Admitting: Anesthesiology

## 2012-01-20 ENCOUNTER — Encounter (HOSPITAL_COMMUNITY): Payer: Self-pay | Admitting: *Deleted

## 2012-01-20 ENCOUNTER — Inpatient Hospital Stay (HOSPITAL_COMMUNITY)
Admission: RE | Admit: 2012-01-20 | Discharge: 2012-01-22 | DRG: 470 | Disposition: A | Discharge status: Home Health | Payer: Medicare Other | Source: Ambulatory Visit | Attending: Orthopedic Surgery | Admitting: Orthopedic Surgery

## 2012-01-20 ENCOUNTER — Encounter (HOSPITAL_COMMUNITY): Payer: Self-pay | Admitting: Anesthesiology

## 2012-01-20 ENCOUNTER — Inpatient Hospital Stay (HOSPITAL_COMMUNITY): Payer: Medicare Other

## 2012-01-20 DIAGNOSIS — S68118A Complete traumatic metacarpophalangeal amputation of other finger, initial encounter: Secondary | ICD-10-CM | POA: Diagnosis not present

## 2012-01-20 DIAGNOSIS — I10 Essential (primary) hypertension: Secondary | ICD-10-CM | POA: Diagnosis present

## 2012-01-20 DIAGNOSIS — Z96649 Presence of unspecified artificial hip joint: Secondary | ICD-10-CM | POA: Diagnosis not present

## 2012-01-20 DIAGNOSIS — M1610 Unilateral primary osteoarthritis, unspecified hip: Principal | ICD-10-CM | POA: Diagnosis present

## 2012-01-20 DIAGNOSIS — Z6841 Body Mass Index (BMI) 40.0 and over, adult: Secondary | ICD-10-CM | POA: Diagnosis not present

## 2012-01-20 DIAGNOSIS — Z01812 Encounter for preprocedural laboratory examination: Secondary | ICD-10-CM

## 2012-01-20 DIAGNOSIS — I739 Peripheral vascular disease, unspecified: Secondary | ICD-10-CM | POA: Diagnosis present

## 2012-01-20 DIAGNOSIS — Z87891 Personal history of nicotine dependence: Secondary | ICD-10-CM

## 2012-01-20 DIAGNOSIS — Z471 Aftercare following joint replacement surgery: Secondary | ICD-10-CM | POA: Diagnosis not present

## 2012-01-20 DIAGNOSIS — Z7982 Long term (current) use of aspirin: Secondary | ICD-10-CM | POA: Diagnosis not present

## 2012-01-20 DIAGNOSIS — Z853 Personal history of malignant neoplasm of breast: Secondary | ICD-10-CM | POA: Diagnosis not present

## 2012-01-20 DIAGNOSIS — Z23 Encounter for immunization: Secondary | ICD-10-CM

## 2012-01-20 DIAGNOSIS — D62 Acute posthemorrhagic anemia: Secondary | ICD-10-CM | POA: Diagnosis not present

## 2012-01-20 DIAGNOSIS — E871 Hypo-osmolality and hyponatremia: Secondary | ICD-10-CM | POA: Diagnosis not present

## 2012-01-20 DIAGNOSIS — M169 Osteoarthritis of hip, unspecified: Secondary | ICD-10-CM | POA: Diagnosis not present

## 2012-01-20 DIAGNOSIS — Z79899 Other long term (current) drug therapy: Secondary | ICD-10-CM

## 2012-01-20 HISTORY — PX: TOTAL HIP ARTHROPLASTY: SHX124

## 2012-01-20 LAB — TYPE AND SCREEN
ABO/RH(D): A POS
Antibody Screen: NEGATIVE

## 2012-01-20 SURGERY — ARTHROPLASTY, HIP, TOTAL, ANTERIOR APPROACH
Anesthesia: General | Site: Hip | Laterality: Left | Wound class: Clean

## 2012-01-20 MED ORDER — LOSARTAN POTASSIUM 50 MG PO TABS
100.0000 mg | ORAL_TABLET | Freq: Every day | ORAL | Status: DC
  Administered 2012-01-21 – 2012-01-22 (×2): 100 mg via ORAL
  Filled 2012-01-20 (×3): qty 2

## 2012-01-20 MED ORDER — PROPOFOL 10 MG/ML IV BOLUS
INTRAVENOUS | Status: DC | PRN
  Administered 2012-01-20: 200 mg via INTRAVENOUS

## 2012-01-20 MED ORDER — DEXAMETHASONE SODIUM PHOSPHATE 10 MG/ML IJ SOLN
10.0000 mg | Freq: Once | INTRAMUSCULAR | Status: DC
  Filled 2012-01-20: qty 1

## 2012-01-20 MED ORDER — ACETAMINOPHEN 325 MG PO TABS: 650.0000 mg | ORAL_TABLET | Freq: Four times a day (QID) | ORAL | Status: DC | PRN

## 2012-01-20 MED ORDER — MENTHOL 3 MG MT LOZG: 1.0000 | LOZENGE | OROMUCOSAL | Status: DC | PRN

## 2012-01-20 MED ORDER — KETOROLAC TROMETHAMINE 15 MG/ML IJ SOLN
7.5000 mg | Freq: Four times a day (QID) | INTRAMUSCULAR | Status: AC
  Administered 2012-01-20 – 2012-01-21 (×3): 7.5 mg via INTRAVENOUS
  Filled 2012-01-20 (×4): qty 1

## 2012-01-20 MED ORDER — DEXAMETHASONE SODIUM PHOSPHATE 10 MG/ML IJ SOLN
INTRAMUSCULAR | Status: DC | PRN
  Administered 2012-01-20: 10 mg via INTRAVENOUS

## 2012-01-20 MED ORDER — CEFAZOLIN SODIUM-DEXTROSE 2-3 GM-% IV SOLR
2.0000 g | Freq: Four times a day (QID) | INTRAVENOUS | Status: AC
  Administered 2012-01-20 – 2012-01-21 (×2): 2 g via INTRAVENOUS
  Filled 2012-01-20 (×2): qty 50

## 2012-01-20 MED ORDER — HYDROMORPHONE HCL PF 1 MG/ML IJ SOLN: 0.2500 mg | INTRAMUSCULAR | Status: DC | PRN

## 2012-01-20 MED ORDER — METHOCARBAMOL 500 MG PO TABS
500.0000 mg | ORAL_TABLET | Freq: Four times a day (QID) | ORAL | Status: DC | PRN
  Administered 2012-01-21 (×2): 500 mg via ORAL
  Filled 2012-01-20 (×2): qty 1

## 2012-01-20 MED ORDER — POLYETHYLENE GLYCOL 3350 17 G PO PACK
17.0000 g | PACK | Freq: Two times a day (BID) | ORAL | Status: DC
  Administered 2012-01-20 – 2012-01-22 (×4): 17 g via ORAL

## 2012-01-20 MED ORDER — ONDANSETRON HCL 4 MG/2ML IJ SOLN
4.0000 mg | Freq: Four times a day (QID) | INTRAMUSCULAR | Status: DC | PRN
  Filled 2012-01-20: qty 2

## 2012-01-20 MED ORDER — DIPHENHYDRAMINE HCL 25 MG PO CAPS: 25.0000 mg | ORAL_CAPSULE | Freq: Four times a day (QID) | ORAL | Status: DC | PRN

## 2012-01-20 MED ORDER — CEFAZOLIN SODIUM-DEXTROSE 2-3 GM-% IV SOLR: 2.0000 g | INTRAVENOUS | Status: DC

## 2012-01-20 MED ORDER — HYDROMORPHONE HCL PF 1 MG/ML IJ SOLN
INTRAMUSCULAR | Status: DC | PRN
  Administered 2012-01-20 (×4): 0.5 mg via INTRAVENOUS

## 2012-01-20 MED ORDER — LIDOCAINE HCL (CARDIAC) 20 MG/ML IV SOLN
INTRAVENOUS | Status: DC | PRN
  Administered 2012-01-20: 80 mg via INTRAVENOUS

## 2012-01-20 MED ORDER — FENTANYL CITRATE 0.05 MG/ML IJ SOLN
INTRAMUSCULAR | Status: DC | PRN
  Administered 2012-01-20 (×2): 50 ug via INTRAVENOUS
  Administered 2012-01-20: 100 ug via INTRAVENOUS
  Administered 2012-01-20: 50 ug via INTRAVENOUS

## 2012-01-20 MED ORDER — TAMOXIFEN CITRATE 10 MG PO TABS
20.0000 mg | ORAL_TABLET | Freq: Every day | ORAL | Status: DC
  Administered 2012-01-21 – 2012-01-22 (×2): 20 mg via ORAL
  Filled 2012-01-20 (×4): qty 2

## 2012-01-20 MED ORDER — ACETAMINOPHEN 10 MG/ML IV SOLN
INTRAVENOUS | Status: AC
  Filled 2012-01-20: qty 100

## 2012-01-20 MED ORDER — ACETAMINOPHEN 10 MG/ML IV SOLN
INTRAVENOUS | Status: DC | PRN
  Administered 2012-01-20: 1000 mg via INTRAVENOUS

## 2012-01-20 MED ORDER — HYDROCODONE-ACETAMINOPHEN 5-325 MG PO TABS
1.0000 | ORAL_TABLET | ORAL | Status: DC
  Administered 2012-01-21 – 2012-01-22 (×6): 1 via ORAL
  Filled 2012-01-20 (×3): qty 1
  Filled 2012-01-20: qty 2
  Filled 2012-01-20: qty 1
  Filled 2012-01-20: qty 2
  Filled 2012-01-20: qty 1

## 2012-01-20 MED ORDER — LACTATED RINGERS IV SOLN: INTRAVENOUS | Status: DC

## 2012-01-20 MED ORDER — CEFAZOLIN SODIUM-DEXTROSE 2-3 GM-% IV SOLR
2.0000 g | INTRAVENOUS | Status: AC
  Administered 2012-01-20: 2 g via INTRAVENOUS

## 2012-01-20 MED ORDER — SODIUM CHLORIDE 0.9 % IV SOLN
INTRAVENOUS | Status: DC
  Administered 2012-01-20 – 2012-01-21 (×2): via INTRAVENOUS
  Filled 2012-01-20 (×5): qty 1000

## 2012-01-20 MED ORDER — LACTATED RINGERS IV SOLN
INTRAVENOUS | Status: DC | PRN
  Administered 2012-01-20: 16:00:00 via INTRAVENOUS

## 2012-01-20 MED ORDER — ADULT MULTIVITAMIN W/MINERALS CH
1.0000 | ORAL_TABLET | Freq: Every day | ORAL | Status: DC
  Administered 2012-01-21 – 2012-01-22 (×2): 1 via ORAL
  Filled 2012-01-20 (×2): qty 1

## 2012-01-20 MED ORDER — ROCURONIUM BROMIDE 100 MG/10ML IV SOLN
INTRAVENOUS | Status: DC | PRN
  Administered 2012-01-20: 50 mg via INTRAVENOUS

## 2012-01-20 MED ORDER — DEXAMETHASONE SODIUM PHOSPHATE 10 MG/ML IJ SOLN
10.0000 mg | Freq: Once | INTRAMUSCULAR | Status: AC
  Administered 2012-01-21: 10 mg via INTRAVENOUS
  Filled 2012-01-20: qty 1

## 2012-01-20 MED ORDER — DOCUSATE SODIUM 100 MG PO CAPS
100.0000 mg | ORAL_CAPSULE | Freq: Two times a day (BID) | ORAL | Status: DC
  Administered 2012-01-20 – 2012-01-22 (×4): 100 mg via ORAL

## 2012-01-20 MED ORDER — LOSARTAN POTASSIUM-HCTZ 100-25 MG PO TABS: 1.0000 | ORAL_TABLET | Freq: Every day | ORAL | Status: DC

## 2012-01-20 MED ORDER — MIDAZOLAM HCL 5 MG/5ML IJ SOLN
INTRAMUSCULAR | Status: DC | PRN
  Administered 2012-01-20: 2 mg via INTRAVENOUS

## 2012-01-20 MED ORDER — GLYCOPYRROLATE 0.2 MG/ML IJ SOLN
INTRAMUSCULAR | Status: DC | PRN
  Administered 2012-01-20: 0.6 mg via INTRAVENOUS

## 2012-01-20 MED ORDER — 0.9 % SODIUM CHLORIDE (POUR BTL) OPTIME
TOPICAL | Status: DC | PRN
  Administered 2012-01-20: 1000 mL

## 2012-01-20 MED ORDER — RIVAROXABAN 10 MG PO TABS
10.0000 mg | ORAL_TABLET | Freq: Every day | ORAL | Status: DC
  Administered 2012-01-21 – 2012-01-22 (×2): 10 mg via ORAL
  Filled 2012-01-20 (×4): qty 1

## 2012-01-20 MED ORDER — FERROUS SULFATE 325 (65 FE) MG PO TABS
325.0000 mg | ORAL_TABLET | Freq: Three times a day (TID) | ORAL | Status: DC
  Administered 2012-01-21 – 2012-01-22 (×4): 325 mg via ORAL
  Filled 2012-01-20 (×7): qty 1

## 2012-01-20 MED ORDER — CHLORHEXIDINE GLUCONATE 4 % EX LIQD
60.0000 mL | Freq: Once | CUTANEOUS | Status: DC
  Filled 2012-01-20: qty 60

## 2012-01-20 MED ORDER — METOCLOPRAMIDE HCL 5 MG/ML IJ SOLN
5.0000 mg | Freq: Three times a day (TID) | INTRAMUSCULAR | Status: DC | PRN
  Administered 2012-01-20: 10 mg via INTRAVENOUS
  Filled 2012-01-20: qty 2

## 2012-01-20 MED ORDER — ONDANSETRON HCL 4 MG PO TABS: 4.0000 mg | ORAL_TABLET | Freq: Four times a day (QID) | ORAL | Status: DC | PRN

## 2012-01-20 MED ORDER — LABETALOL HCL 5 MG/ML IV SOLN
INTRAVENOUS | Status: DC | PRN
  Administered 2012-01-20: 2.5 mg via INTRAVENOUS

## 2012-01-20 MED ORDER — ONDANSETRON HCL 4 MG/2ML IJ SOLN
INTRAMUSCULAR | Status: DC | PRN
  Administered 2012-01-20: 4 mg via INTRAVENOUS

## 2012-01-20 MED ORDER — ACETAMINOPHEN 650 MG RE SUPP: 650.0000 mg | Freq: Four times a day (QID) | RECTAL | Status: DC | PRN

## 2012-01-20 MED ORDER — HYDROCHLOROTHIAZIDE 25 MG PO TABS
25.0000 mg | ORAL_TABLET | Freq: Every day | ORAL | Status: DC
  Administered 2012-01-21 – 2012-01-22 (×2): 25 mg via ORAL
  Filled 2012-01-20 (×3): qty 1

## 2012-01-20 MED ORDER — NEOSTIGMINE METHYLSULFATE 1 MG/ML IJ SOLN
INTRAMUSCULAR | Status: DC | PRN
  Administered 2012-01-20: 5 mg via INTRAVENOUS

## 2012-01-20 MED ORDER — METHOCARBAMOL 100 MG/ML IJ SOLN
500.0000 mg | Freq: Four times a day (QID) | INTRAVENOUS | Status: DC | PRN
  Filled 2012-01-20: qty 5

## 2012-01-20 MED ORDER — CEFAZOLIN SODIUM-DEXTROSE 2-3 GM-% IV SOLR
INTRAVENOUS | Status: AC
  Filled 2012-01-20: qty 50

## 2012-01-20 MED ORDER — EPHEDRINE SULFATE 50 MG/ML IJ SOLN
INTRAMUSCULAR | Status: DC | PRN
  Administered 2012-01-20 (×2): 5 mg via INTRAVENOUS

## 2012-01-20 MED ORDER — METOCLOPRAMIDE HCL 10 MG PO TABS: 5.0000 mg | ORAL_TABLET | Freq: Three times a day (TID) | ORAL | Status: DC | PRN

## 2012-01-20 MED ORDER — PHENOL 1.4 % MT LIQD: 1.0000 | OROMUCOSAL | Status: DC | PRN

## 2012-01-20 SURGICAL SUPPLY — 37 items
BAG ZIPLOCK 12X15 (MISCELLANEOUS) ×2 IMPLANT
BLADE SAW SGTL 18X1.27X75 (BLADE) ×2 IMPLANT
CLOTH BEACON ORANGE TIMEOUT ST (SAFETY) ×2 IMPLANT
DERMABOND ADVANCED (GAUZE/BANDAGES/DRESSINGS) ×1
DERMABOND ADVANCED .7 DNX12 (GAUZE/BANDAGES/DRESSINGS) ×1 IMPLANT
DRAPE C-ARM 42X72 X-RAY (DRAPES) ×2 IMPLANT
DRAPE STERI IOBAN 125X83 (DRAPES) ×2 IMPLANT
DRAPE U-SHAPE 47X51 STRL (DRAPES) ×6 IMPLANT
DRSG AQUACEL AG ADV 3.5X10 (GAUZE/BANDAGES/DRESSINGS) ×2 IMPLANT
DRSG TEGADERM 4X4.75 (GAUZE/BANDAGES/DRESSINGS) ×4 IMPLANT
DURAPREP 26ML APPLICATOR (WOUND CARE) ×2 IMPLANT
ELECT BLADE TIP CTD 4 INCH (ELECTRODE) ×2 IMPLANT
ELECT REM PT RETURN 9FT ADLT (ELECTROSURGICAL) ×2
ELECTRODE REM PT RTRN 9FT ADLT (ELECTROSURGICAL) ×1 IMPLANT
EVACUATOR 1/8 PVC DRAIN (DRAIN) IMPLANT
FACESHIELD LNG OPTICON STERILE (SAFETY) ×8 IMPLANT
GAUZE SPONGE 2X2 8PLY STRL LF (GAUZE/BANDAGES/DRESSINGS) ×1 IMPLANT
GLOVE BIOGEL PI IND STRL 7.5 (GLOVE) ×1 IMPLANT
GLOVE BIOGEL PI IND STRL 8 (GLOVE) ×1 IMPLANT
GLOVE BIOGEL PI INDICATOR 7.5 (GLOVE) ×1
GLOVE BIOGEL PI INDICATOR 8 (GLOVE) ×1
GLOVE ECLIPSE 8.0 STRL XLNG CF (GLOVE) ×2 IMPLANT
GLOVE ORTHO TXT STRL SZ7.5 (GLOVE) ×4 IMPLANT
GOWN BRE IMP PREV XXLGXLNG (GOWN DISPOSABLE) ×4 IMPLANT
GOWN STRL NON-REIN LRG LVL3 (GOWN DISPOSABLE) ×2 IMPLANT
KIT BASIN OR (CUSTOM PROCEDURE TRAY) ×2 IMPLANT
PACK TOTAL JOINT (CUSTOM PROCEDURE TRAY) ×2 IMPLANT
PADDING CAST COTTON 6X4 STRL (CAST SUPPLIES) ×2 IMPLANT
SPONGE GAUZE 2X2 STER 10/PKG (GAUZE/BANDAGES/DRESSINGS) ×1
SUCTION FRAZIER 12FR DISP (SUCTIONS) ×2 IMPLANT
SUT MNCRL AB 4-0 PS2 18 (SUTURE) ×2 IMPLANT
SUT VIC AB 1 CT1 36 (SUTURE) ×8 IMPLANT
SUT VIC AB 2-0 CT1 27 (SUTURE) ×2
SUT VIC AB 2-0 CT1 TAPERPNT 27 (SUTURE) ×2 IMPLANT
SUT VLOC 180 0 24IN GS25 (SUTURE) ×2 IMPLANT
TOWEL OR 17X26 10 PK STRL BLUE (TOWEL DISPOSABLE) ×4 IMPLANT
TRAY FOLEY CATH 14FRSI W/METER (CATHETERS) ×2 IMPLANT

## 2012-01-20 NOTE — Interval H&P Note (Signed)
History and Physical Interval Note:  01/20/2012 2:45 PM  Kristen Escobar  has presented today for surgery, with the diagnosis of Osteoarthritis of the Left Hip  The various methods of treatment have been discussed with the patient and family. After consideration of risks, benefits and other options for treatment, the patient has consented to  Procedure(s) (LRB) with comments: TOTAL HIP ARTHROPLASTY ANTERIOR APPROACH (Left) as a surgical intervention .  [name] history has been reviewed, patient examined, no change in status, stable for surgery.  I have reviewed the patient's chart and labs.  Questions were answered to the patient's satisfaction.     Shelda Pal

## 2012-01-20 NOTE — Op Note (Signed)
NAME:  Kristen Escobar                ACCOUNT NO.: [id number]      MEDICAL RECORD NO.: [id number]      FACILITY:  [facility]      PHYSICIAN:  Durene Romans D  DATE OF BIRTH:  01-15-1946     DATE OF PROCEDURE:  01/20/2012                                 OPERATIVE REPORT         PREOPERATIVE DIAGNOSIS: Left  hip osteoarthritis.      POSTOPERATIVE DIAGNOSIS:  Left hip osteoarthritis.      PROCEDURE:  Left total hip replacement through an anterior approach   utilizing DePuy THR system, component size 50mm pinnacle cup, a size 32+4 neutral   Altrex liner, a size 5 Hi Tri Lock stem with a 32+1 delta ceramic   ball.      SURGEON:  Madlyn Frankel. Charlann Boxer, M.D.      ASSISTANT:  Lanney Gins, PA      ANESTHESIA:  General.      SPECIMENS:  None.      COMPLICATIONS:  None.      BLOOD LOSS:  350 cc     DRAINS:  One Hemovac.      INDICATION OF THE PROCEDURE:  Kristen Escobar is a 66 y.o. female who had   presented to office for evaluation of left hip pain.  Radiographs revealed   progressive degenerative changes with bone-on-bone   articulation to the  hip joint.  The patient had painful limited range of   motion significantly affecting their overall quality of life.  The patient was failing to    respond to conservative measures, and at this point was ready   to proceed with more definitive measures.  The patient has noted progressive   degenerative changes in his hip, progressive problems and dysfunction   with regarding the hip prior to surgery.  Consent was obtained for   benefit of pain relief.  Specific risk of infection, DVT, component   failure, dislocation, need for revision surgery, as well discussion of   the anterior versus posterior approach were reviewed.  Consent was   obtained for benefit of anterior pain relief through an anterior   approach.      PROCEDURE IN DETAIL:  The patient was brought to operative theater.   Once adequate anesthesia,  preoperative antibiotics, 2 gm Ancef administered.   The patient was positioned supine on the OSI Hanna table.  Once adequate   padding of boney process was carried out, we had predraped out the hip, and  used fluoroscopy to confirm orientation of the pelvis and position.      The left hip was then prepped and draped from proximal iliac crest to   mid thigh with shower curtain technique.      Time-out was performed identifying the patient, planned procedure, and   extremity.     An incision was then made 2 cm distal and lateral to the   anterior superior iliac spine extending over the orientation of the   tensor fascia lata muscle and sharp dissection was carried down to the   fascia of the muscle and protractor placed in the soft tissues.      The fascia was then incised.  The muscle belly was identified and swept  laterally and retractor placed along the superior neck.  Following   cauterization of the circumflex vessels and removing some pericapsular   fat, a second cobra retractor was placed on the inferior neck.  A third   retractor was placed on the anterior acetabulum after elevating the   anterior rectus.  A L-capsulotomy was along the line of the   superior neck to the trochanteric fossa, then extended proximally and   distally.  Tag sutures were placed and the retractors were then placed   intracapsular.  We then identified the trochanteric fossa and   orientation of my neck cut, confirmed this radiographically   and then made a neck osteotomy with the femur on traction.  The femoral   head was removed without difficulty or complication.  Traction was let   off and retractors were placed posterior and anterior around the   acetabulum.      The labrum and foveal tissue were debrided.  I began reaming with a 45mm   reamer and reamed up to 49mm reamer with good bony bed preparation and a 50   cup was chosen.  The final 50 mm Pinnacle cup was then impacted under fluoroscopy   to confirm the depth of penetration and orientation with respect to   abduction.  A screw was placed followed by the hole eliminator.  The final   32+4 neutral Altrex liner was impacted with good visualized rim fit.  The cup was positioned anatomically within the acetabular portion of the pelvis.      At this point, the femur was rolled at 80 degrees.  Further capsule was   released off the inferior aspect of the femoral neck.  I then   released the superior capsule proximally.  The hook was placed laterally   along the femur and elevated manually and held in position with the bed   hook.  The leg was then extended and adducted with the leg rolled to 100   degrees of external rotation.  Once the proximal femur was fully   exposed, I used a box osteotome to set orientation.  I then began   broaching with the starting chili pepper broach and passed this by hand and then broached up to 5.  With the 5 broach in place I chose a high offset neck to match the contralateral hip and did a trial reduction.  With a 32+1 trail head, again like the other hip, the offset was appropriate, leg lengths   appeared to be equal, confirmed radiographically.   Given these findings, I went ahead and dislocated the hip, repositioned all   retractors and positioned the right hip in the extended and abducted position.  The final 5 high offset Tri Lock stem was   chosen and it was impacted down to the level of neck cut.  Based on this   and the trial reduction, a 32+1 delta ceramic ball was chosen and   impacted onto a clean and dry trunnion, and the hip was reduced.  The   hip had been irrigated throughout the case again at this point.  I did   reapproximate the superior capsular leaflet to the anterior leaflet   using #1 Vicryl, placed a medium Hemovac drain deep.  The fascia of the   tensor fascia lata muscle was then reapproximated using #1 Vicryl.  The   remaining wound was closed with 2-0 Vicryl and running 4-0  Monocryl.   The hip was cleaned, dried, and  dressed sterilely using Dermabond and   Aquacel dressing.  Drain site dressed separately.  She was then brought   to recovery room in stable condition tolerating the procedure well.    Lanney Gins, PA-C was present for the entirety of the case involved from   preoperative positioning, perioperative retractor management, general   facilitation of the case, as well as primary wound closure as assistant.            Madlyn Frankel Charlann Boxer, M.D.            MDO/MEDQ  D:  02/11/2011  T:  02/11/2011  Job:  [id number]      Electronically Signed by Durene Romans M.D. on 02/17/2011 09:15:38 AM

## 2012-01-20 NOTE — Preoperative (Signed)
Beta Blockers   Reason not to administer Beta Blockers:Not Applicable 

## 2012-01-20 NOTE — Anesthesia Postprocedure Evaluation (Signed)
  Anesthesia Post-op Note  Patient: Kristen Escobar  Procedure(s) Performed: Procedure(s) (LRB): TOTAL HIP ARTHROPLASTY ANTERIOR APPROACH (Left)  Patient Location: PACU  Anesthesia Type: General  Level of Consciousness: awake and alert   Airway and Oxygen Therapy: Patient Spontanous Breathing  Post-op Pain: mild  Post-op Assessment: Post-op Vital signs reviewed, Patient's Cardiovascular Status Stable, Respiratory Function Stable, Patent Airway and No signs of Nausea or vomiting  Post-op Vital Signs: stable  Complications: No apparent anesthesia complications

## 2012-01-20 NOTE — Anesthesia Preprocedure Evaluation (Addendum)
Anesthesia Evaluation  Patient identified by MRN, date of birth, ID band Patient awake    Reviewed: Allergy & Precautions, H&P , NPO status , Patient's Chart, lab work & pertinent test results  Airway Mallampati: II TM Distance: >3 FB Neck ROM: full    Dental   Pulmonary neg pulmonary ROS,  breath sounds clear to auscultation  Pulmonary exam normal       Cardiovascular Exercise Tolerance: Poor hypertension, Pt. on medications Rhythm:regular Rate:Normal  PVDz necessitating amputation both index fingers   Neuro/Psych negative neurological ROS  negative psych ROS   GI/Hepatic negative GI ROS, Neg liver ROS,   Endo/Other  negative endocrine ROSMorbid obesity  Renal/GU negative Renal ROS  negative genitourinary   Musculoskeletal   Abdominal   Peds  Hematology negative hematology ROS (+)   Anesthesia Other Findings   Reproductive/Obstetrics negative OB ROS                           Anesthesia Physical Anesthesia Plan  ASA: III  Anesthesia Plan: General   Post-op Pain Management:    Induction: Intravenous  Airway Management Planned: Oral ETT  Additional Equipment:   Intra-op Plan:   Post-operative Plan: Extubation in OR  Informed Consent: I have reviewed the patients History and Physical, chart, labs and discussed the procedure including the risks, benefits and alternatives for the proposed anesthesia with the patient or authorized representative who has indicated his/her understanding and acceptance.   Dental Advisory Given  Plan Discussed with: CRNA and Surgeon  Anesthesia Plan Comments:         Anesthesia Quick Evaluation

## 2012-01-20 NOTE — Transfer of Care (Signed)
Immediate Anesthesia Transfer of Care Note  Patient: Kristen Escobar  Procedure(s) Performed: Procedure(s) (LRB) with comments: TOTAL HIP ARTHROPLASTY ANTERIOR APPROACH (Left)  Patient Location: PACU  Anesthesia Type: General  Level of Consciousness: awake, sedated and patient cooperative  Airway & Oxygen Therapy: Patient Spontanous Breathing and Patient connected to face mask oxygen  Post-op Assessment: Report given to PACU RN and Post -op Vital signs reviewed and stable  Post vital signs: Reviewed and stable  Complications: No apparent anesthesia complications

## 2012-01-21 LAB — CBC
MCV: 83.4 fL (ref 78.0–100.0)
Platelets: 239 10*3/uL (ref 150–400)
RBC: 3.62 MIL/uL — ABNORMAL LOW (ref 3.87–5.11)
RDW: 14.3 % (ref 11.5–15.5)
WBC: 9.5 10*3/uL (ref 4.0–10.5)

## 2012-01-21 LAB — BASIC METABOLIC PANEL
CO2: 21 mEq/L (ref 19–32)
Chloride: 99 mEq/L (ref 96–112)
Creatinine, Ser: 1.19 mg/dL — ABNORMAL HIGH (ref 0.50–1.10)
GFR calc Af Amer: 54 mL/min — ABNORMAL LOW (ref >90)
Sodium: 133 mEq/L — ABNORMAL LOW (ref 135–145)

## 2012-01-21 NOTE — Evaluation (Signed)
Physical Therapy Evaluation Patient Details Name: Kristen Escobar MRN: [id number] DOB: Jan 27, 1946 Today's Date: 01/21/2012 Time:  -     PT Assessment / Plan / Recommendation Clinical Impression       PT Assessment  Patient needs continued PT services    Follow Up Recommendations  Home health PT    Barriers to Discharge None      Equipment Recommendations  None recommended by PT    Recommendations for Other Services     Frequency 7X/week    Precautions / Restrictions Precautions Precautions: None Restrictions Weight Bearing Restrictions: No Other Position/Activity Restrictions: WBAT   Pertinent Vitals/Pain 3/10      Mobility  Bed Mobility Bed Mobility: Supine to Sit Supine to Sit: 4: Min guard Details for Bed Mobility Assistance: min cues for sequence Transfers Transfers: Sit to Stand;Stand to Sit Sit to Stand: 4: Min guard Stand to Sit: 4: Min guard Details for Transfer Assistance: cues for LE management and use of UEs to self assist Ambulation/Gait Ambulation/Gait Assistance: 4: Min assist Assistive device: Rolling walker Gait Pattern: Step-to pattern    Shoulder Instructions     Exercises Total Joint Exercises Ankle Circles/Pumps: AROM;Both;15 reps;Supine Quad Sets: AROM;Both;10 reps;Supine Heel Slides: AAROM;15 reps;Supine;Left Hip ABduction/ADduction: AAROM;Left;15 reps;Supine   PT Diagnosis:    PT Problem List: Decreased strength;Decreased range of motion;Decreased activity tolerance;Decreased mobility;Pain;Decreased knowledge of use of DME PT Treatment Interventions: DME instruction;Gait training;Stair training;Functional mobility training;Therapeutic activities;Therapeutic exercise;Patient/family education   PT Goals Acute Rehab PT Goals PT Goal Formulation: With patient Time For Goal Achievement: 01/26/12 Potential to Achieve Goals: Good Pt will go Supine/Side to Sit: with supervision Pt will go Sit to Supine/Side: with supervision Pt will go  Sit to Stand: with supervision Pt will go Stand to Sit: with supervision Pt will Ambulate: >150 feet;with supervision;with rolling walker Pt will Go Up / Down Stairs: 3-5 stairs;with min assist;with least restrictive assistive device  Visit Information  Last PT Received On: 01/21/12 Assistance Needed: +1    Subjective Data  Subjective: I did so well with my first one, here I am again Patient Stated Goal: Resume previous lifestyle with decreased pain   Prior Functioning  Home Living Lives With: Spouse Available Help at Discharge: Family Type of Home: House Home Access: Stairs to enter Secretary/administrator of Steps: 4 Entrance Stairs-Rails: Right Home Layout: One level Home Adaptive Equipment: Walker - rolling Prior Function Level of Independence: Independent Able to Take Stairs?: Yes Driving: Yes Communication Communication: No difficulties    Cognition  Overall Cognitive Status: Appears within functional limits for tasks assessed/performed Arousal/Alertness: Awake/alert Orientation Level: Appears intact for tasks assessed Behavior During Session: [facility] for tasks performed    Extremity/Trunk Assessment Right Upper Extremity Assessment RUE ROM/Strength/Tone: [facility] for tasks assessed Left Upper Extremity Assessment LUE ROM/Strength/Tone: [facility] for tasks assessed Right Lower Extremity Assessment RLE ROM/Strength/Tone: [facility] for tasks assessed Left Lower Extremity Assessment LLE ROM/Strength/Tone: Deficits LLE ROM/Strength/Tone Deficits: hip flex to 80, abd to 20 and hip strength 3-/5   Balance    End of Session PT - End of Session Activity Tolerance: Patient tolerated treatment well Patient left: in chair;with call bell/phone within reach Nurse Communication: Mobility status  GP     Ameliarose Shark 01/21/2012, 12:14 PM

## 2012-01-21 NOTE — Progress Notes (Signed)
OT Note:  Pt recently had THA and has help.  She did not toilet DME and does not feel she needs any at this time as she has no precautions.  Screen only.  [place], OTR/L [phone] 01/21/2012

## 2012-01-21 NOTE — Progress Notes (Signed)
Physical Therapy Treatment Patient Details Name: Kristen Escobar MRN: [id number] DOB: December 18, 1945 Today's Date: 01/21/2012 Time: 4098-1191 PT Time Calculation (min): 15 min  PT Assessment / Plan / Recommendation Comments on Treatment Session       Follow Up Recommendations  Home health PT    Barriers to Discharge        Equipment Recommendations  None recommended by PT    Recommendations for Other Services    Frequency 7X/week   Plan      Precautions / Restrictions Precautions Precautions: None Restrictions Weight Bearing Restrictions: No Other Position/Activity Restrictions: WBAT   Pertinent Vitals/Pain 3/10    Mobility  Bed Mobility Bed Mobility: Sit to Supine Sit to Supine: 4: Min guard Details for Bed Mobility Assistance: min cues for sequence Transfers Transfers: Sit to Stand;Stand to Sit Sit to Stand: 4: Min guard Stand to Sit: 4: Min guard Details for Transfer Assistance: cues for LE management and use of UEs to self assist Ambulation/Gait Ambulation/Gait Assistance: 4: Min guard Ambulation Distance (Feet): 280 Feet Assistive device: Rolling walker Ambulation/Gait Assistance Details: Cues for posture and position from RW Gait Pattern: Step-to pattern    Exercises     PT Diagnosis:    PT Problem List:   PT Treatment Interventions:     PT Goals Acute Rehab PT Goals PT Goal Formulation: With patient Time For Goal Achievement: 01/26/12 Potential to Achieve Goals: Good Pt will go Supine/Side to Sit: with supervision PT Goal: Supine/Side to Sit - Progress: Progressing toward goal Pt will go Sit to Supine/Side: with supervision PT Goal: Sit to Supine/Side - Progress: Progressing toward goal Pt will go Sit to Stand: with supervision PT Goal: Sit to Stand - Progress: Progressing toward goal Pt will go Stand to Sit: with supervision PT Goal: Stand to Sit - Progress: Progressing toward goal Pt will Ambulate: >150 feet;with supervision;with rolling walker PT  Goal: Ambulate - Progress: Progressing toward goal  Visit Information  Last PT Received On: 01/21/12 Assistance Needed: +1    Subjective Data  Patient Stated Goal: Resume previous lifestyle with decreased pain   Cognition  Overall Cognitive Status: Appears within functional limits for tasks assessed/performed Arousal/Alertness: Awake/alert Orientation Level: Appears intact for tasks assessed Behavior During Session: [facility] for tasks performed    Balance     End of Session PT - End of Session Activity Tolerance: Patient tolerated treatment well Patient left: with call bell/phone within reach;in bed Nurse Communication: Mobility status   GP     Kristen Escobar 01/21/2012, 4:22 PM

## 2012-01-21 NOTE — Progress Notes (Signed)
Utilization review completed.  

## 2012-01-21 NOTE — Progress Notes (Signed)
    Subjective: 1 Day Post-Op Procedure(s) (LRB): TOTAL HIP ARTHROPLASTY ANTERIOR APPROACH (Left)   Patient reports pain as mild, pain well controlled. No events throughout the night.   Objective:   VITALS:   Filed Vitals:   01/21/12 0650  BP: 127/73  Pulse: 65  Temp: 98.1 F (36.7 C)  Resp: 16    Neurovascular intact Dorsiflexion/Plantar flexion intact Incision: dressing C/D/I No cellulitis present Compartment soft  LABS  Basename 01/21/12 0415  HGB 10.2*  HCT 30.2*  WBC 9.5  PLT 239     Basename 01/21/12 0415  NA 133*  K 4.1  BUN 26*  CREATININE 1.19*  GLUCOSE 214*     Assessment/Plan: 1 Day Post-Op Procedure(s) (LRB): TOTAL HIP ARTHROPLASTY ANTERIOR APPROACH (Left) HV drain d/c'ed Foley cath d/c'ed Advance diet Up with therapy D/C IV fluids Plan for discharge tomorrow if continues to progress well   Expected ABLA  Treated with iron and will observe  Morbid Obesity (BMI >40)  Estimated Body mass index is 40.49 kg/(m^2) as calculated from the following:   Height as of this encounter: 5\' 4" (1.626 m).   Weight as of this encounter: 235 lb 14.3 oz(107 kg). Patient also counseled that weight may inhibit the healing process Patient counseled that losing weight will help with future health issues  Hyponatremia Treated with IV fluids and will observe      Anastasio Auerbach. Valen Gillison   PAC  01/21/2012, 9:06 AM

## 2012-01-22 ENCOUNTER — Encounter (HOSPITAL_COMMUNITY): Payer: Self-pay | Admitting: Orthopedic Surgery

## 2012-01-22 LAB — BASIC METABOLIC PANEL
BUN: 30 mg/dL — ABNORMAL HIGH (ref 6–23)
CO2: 24 mEq/L (ref 19–32)
Chloride: 102 mEq/L (ref 96–112)
GFR calc Af Amer: 45 mL/min — ABNORMAL LOW (ref >90)
Potassium: 4.3 mEq/L (ref 3.5–5.1)

## 2012-01-22 LAB — CBC
HCT: 26.7 % — ABNORMAL LOW (ref 36.0–46.0)
Platelets: 217 10*3/uL (ref 150–400)
RBC: 3.23 MIL/uL — ABNORMAL LOW (ref 3.87–5.11)
RDW: 14.6 % (ref 11.5–15.5)
WBC: 12.3 10*3/uL — ABNORMAL HIGH (ref 4.0–10.5)

## 2012-01-22 MED ORDER — ACETAMINOPHEN 325 MG PO TABS: 650.0000 mg | ORAL_TABLET | Freq: Four times a day (QID) | ORAL | Status: DC | PRN

## 2012-01-22 MED ORDER — FERROUS SULFATE 325 (65 FE) MG PO TABS: 325.0000 mg | ORAL_TABLET | Freq: Three times a day (TID) | ORAL | Status: DC

## 2012-01-22 MED ORDER — POLYETHYLENE GLYCOL 3350 17 G PO PACK: 17.0000 g | PACK | Freq: Two times a day (BID) | ORAL | Status: DC

## 2012-01-22 MED ORDER — TRAMADOL HCL 50 MG PO TABS: 50.0000 mg | ORAL_TABLET | Freq: Four times a day (QID) | ORAL | Status: DC | PRN

## 2012-01-22 MED ORDER — DSS 100 MG PO CAPS: 100.0000 mg | ORAL_CAPSULE | Freq: Two times a day (BID) | ORAL | Status: DC

## 2012-01-22 MED ORDER — ASPIRIN EC 325 MG PO TBEC: 325.0000 mg | DELAYED_RELEASE_TABLET | Freq: Two times a day (BID) | ORAL | Status: DC

## 2012-01-22 MED ORDER — RIVAROXABAN 10 MG PO TABS: 10.0000 mg | ORAL_TABLET | Freq: Every day | ORAL | Status: DC

## 2012-01-22 MED ORDER — DIPHENHYDRAMINE HCL 25 MG PO CAPS: 25.0000 mg | ORAL_CAPSULE | Freq: Four times a day (QID) | ORAL | Status: DC | PRN

## 2012-01-22 NOTE — Progress Notes (Signed)
   Subjective: 2 Days Post-Op Procedure(s) (LRB): TOTAL HIP ARTHROPLASTY ANTERIOR APPROACH (Left)   Patient reports pain as mild, pain well controlled. No events throughout the night. Ready to be discharged home.  Objective:   VITALS:   Filed Vitals:   01/22/12 0515  BP: 126/75  Pulse: 64  Temp: 97.5 F (36.4 C)  Resp: 16    Neurovascular intact Dorsiflexion/Plantar flexion intact Incision: dressing C/D/I No cellulitis present Compartment soft  LABS  Basename 01/22/12 0410 01/21/12 0415  HGB 8.8* 10.2*  HCT 26.7* 30.2*  WBC 12.3* 9.5  PLT 217 239     Basename 01/22/12 0410 01/21/12 0415  NA 135 133*  K 4.3 4.1  BUN 30* 26*  CREATININE 1.39* 1.19*  GLUCOSE 159* 214*     Assessment/Plan: 2 Days Post-Op Procedure(s) (LRB): TOTAL HIP ARTHROPLASTY ANTERIOR APPROACH (Left) Up with therapy Discharge home with home health Follow up in 2 weeks at [facility]. Follow-up Information    Follow up with OLIN,Cisco Kindt D in 2 weeks.   Contact information:   [facility] [address] [place] [address] [phone]          Expected ABLA  Treated with iron and will observe    Morbid Obesity (BMI >40)  Estimated Body mass index is 40.49 kg/(m^2) as calculated from the following:  Height as of this encounter: 5\' 4" (1.626 m).  Weight as of this encounter: 235 lb 14.3 oz(107 kg).  Patient also counseled that weight may inhibit the healing process  Patient counseled that losing weight will help with future health issues     Anastasio Auerbach. Virgel Haro   PAC  01/22/2012, 9:52 AM

## 2012-01-22 NOTE — Care Management Note (Signed)
    Page 1 of 2   01/22/2012     1:37:30 PM   CARE MANAGEMENT NOTE 01/22/2012  Patient:  Kristen Escobar, Kristen Escobar   Account Number:  [id number]  Date Initiated:  01/21/2012  Documentation initiated by:  Colleen Can  Subjective/Objective Assessment:   dx anterior hip replacemnt     Action/Plan:   Plans home with spouse as caregiver. Will have [facility] for [facility] services. Already had dme   Anticipated DC Date:  01/22/2012   Anticipated DC Plan:  HOME W HOME HEALTH SERVICES  In-house referral  NA      DC Planning Services  CM consult      [facility] Choice  HOME HEALTH   Choice offered to / List presented to:  C-1 Patient   DME arranged  NA      DME agency  NA     HH arranged  HH-2 PT      [facility] agency  [facility]   Status of service:  Completed, signed off Medicare Important Message given?  NA - LOS <3 / Initial given by admissions (If response is "NO", the following Medicare IM given date fields will be blank) Date Medicare IM given:   Date Additional Medicare IM given:    Discharge Disposition:  HOME W HOME HEALTH SERVICES  Per UR Regulation:    If discussed at Long Length of Stay Meetings, dates discussed:    Comments:  01/22/2012 [name] BSN CCM [phone] Pt discharged today with [facility] services in place. [name] will start services tomorrow 01/23/2012.

## 2012-01-22 NOTE — Progress Notes (Signed)
Physical Therapy Treatment Patient Details Name: Kristen Escobar MRN: [id number] DOB: May 27, 1945 Today's Date: 01/22/2012 Time: 4132-4401 PT Time Calculation (min): 33 min  PT Assessment / Plan / Recommendation Comments on Treatment Session       Follow Up Recommendations  Home health PT    Barriers to Discharge        Equipment Recommendations  None recommended by PT    Recommendations for Other Services    Frequency 7X/week   Plan Discharge plan remains appropriate    Precautions / Restrictions Precautions Precautions: None Restrictions Weight Bearing Restrictions: No Other Position/Activity Restrictions: WBAT   Pertinent Vitals/Pain 2/10;    Mobility  Bed Mobility Bed Mobility: Supine to Sit;Sit to Supine Supine to Sit: 5: Supervision Sit to Supine: 5: Supervision Details for Bed Mobility Assistance: min cues for sequence Transfers Transfers: Sit to Stand;Stand to Sit Sit to Stand: 5: Supervision Stand to Sit: 5: Supervision Details for Transfer Assistance: cues for LE management and use of UEs to self assist Ambulation/Gait Ambulation/Gait Assistance: 5: Supervision Ambulation Distance (Feet): 300 Feet Assistive device: Rolling walker Ambulation/Gait Assistance Details: min cues for position from RW Gait Pattern: Step-to pattern Stairs: Yes Stairs Assistance: 4: Min guard Stair Management Technique: One rail Left;With cane;Step to pattern Number of Stairs: 4  (twice)    Exercises Total Joint Exercises Ankle Circles/Pumps: AROM;Both;Supine;20 reps Gluteal Sets: AROM;Both;20 reps;Supine Heel Slides: AAROM;Supine;20 reps;Both Hip ABduction/ADduction: AAROM;Supine;20 reps;Both Long Arc Quad: AROM;20 reps;Seated;Both   PT Diagnosis:    PT Problem List:   PT Treatment Interventions:     PT Goals Acute Rehab PT Goals PT Goal Formulation: With patient Time For Goal Achievement: 01/26/12 Potential to Achieve Goals: Good Pt will go Supine/Side to Sit: with  supervision PT Goal: Supine/Side to Sit - Progress: Met Pt will go Sit to Supine/Side: with supervision PT Goal: Sit to Supine/Side - Progress: Met Pt will go Sit to Stand: with supervision PT Goal: Sit to Stand - Progress: Met Pt will go Stand to Sit: with supervision PT Goal: Stand to Sit - Progress: Met Pt will Ambulate: >150 feet;with supervision;with rolling walker PT Goal: Ambulate - Progress: Met Pt will Go Up / Down Stairs: 3-5 stairs;with min assist;with least restrictive assistive device PT Goal: Up/Down Stairs - Progress: Met  Visit Information  Last PT Received On: 01/22/12 Assistance Needed: +1    Subjective Data  Subjective: I'm going home Patient Stated Goal: Resume previous lifestyle with decreased pain   Cognition  Overall Cognitive Status: Appears within functional limits for tasks assessed/performed Arousal/Alertness: Awake/alert Orientation Level: Appears intact for tasks assessed Behavior During Session: [facility] for tasks performed    Balance     End of Session PT - End of Session Activity Tolerance: Patient tolerated treatment well Patient left: in chair;with call bell/phone within reach Nurse Communication: Mobility status   GP     Kristen Escobar 01/22/2012, 8:31 AM

## 2012-01-23 DIAGNOSIS — M169 Osteoarthritis of hip, unspecified: Secondary | ICD-10-CM | POA: Diagnosis not present

## 2012-01-23 DIAGNOSIS — Z471 Aftercare following joint replacement surgery: Secondary | ICD-10-CM | POA: Diagnosis not present

## 2012-01-23 DIAGNOSIS — D62 Acute posthemorrhagic anemia: Secondary | ICD-10-CM | POA: Diagnosis not present

## 2012-01-23 DIAGNOSIS — Z96649 Presence of unspecified artificial hip joint: Secondary | ICD-10-CM | POA: Diagnosis not present

## 2012-01-23 DIAGNOSIS — IMO0001 Reserved for inherently not codable concepts without codable children: Secondary | ICD-10-CM | POA: Diagnosis not present

## 2012-01-23 NOTE — Discharge Summary (Signed)
Physician Discharge Summary  Patient ID: Kristen Escobar MRN: [id number] DOB/AGE: 65-Nov-1947 65 y.o.  Admit date: 01/20/2012 Discharge date: 01/22/2012   Procedures:  Procedure(s) (LRB): TOTAL HIP ARTHROPLASTY ANTERIOR APPROACH (Left)  Attending Physician:  Dr. Durene Romans   Admission Diagnoses:   Left hip OA and pain   Discharge Diagnoses:  Principal Problem:  *S/P left THA, AA Active Problems:  Expected blood loss anemia  Hyponatremia Hypertension   Obesity   Cancer - 03/2009 breast- rt   Peripheral vascular disease - 1995 PT DEVELOPED CIRCULATION PROBLEMS IN BOTH HANDS AND                    GANGRENE OF BOTH INDEX FINGERS--REQUIRING AMPUTATION OF THE INDEX FINGERS                    AND VASCULAR SURGERY. PT TOLD HER PROBLEMS RELATED TO SMOKING. NO OTHER                    PROBLEMS SINCE.   Arthritis    HPI: This is a 66 year old lady with end-stage osteoarthritis of left hip that has failed conservative treatment. X-rays show end-stage OA changes of the left hip. After discussion of treatment, benefits, risks, and options, the patient is now scheduled for total hip arthroplasty by anterior approach of the left hip. Note that the patient is not a candidate for tranexamic acid, but she is a candidate for dexamethasone and will receive dexamethasone at surgery. 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 With her breast history and history of Buerger disease, we will put her on Xarelto 10 mg every other day for 2 weeks postop and then switch to aspirin for her DVT prophylaxis. Her medical doctor is Dr. Eleonore Chiquito; specialty physician Dr. Drue Second. She is planning on going home after surgery.  PCP: [name], MD   Discharged Condition: good  Hospital Course:  Patient underwent the above stated procedure on 01/20/2012. Patient tolerated  the procedure well and brought to the recovery room in good condition and subsequently to the floor.  POD #1 BP: 127/73 ; Pulse: 65 ; Temp: 98.1 F (36.7 C) ; Resp: 16  Pt's foley was removed, as well as the hemovac drain removed. IV was changed to a saline lock. Patient reports pain as mild, pain well controlled. No events throughout the night.  Neurovascular intact, dorsiflexion/plantar flexion intact, incision: dressing C/D/I, no cellulitis present and compartment soft.   LABS  Basename  01/21/12 0415   HGB  10.2  HCT  30.2   POD #2  BP: 126/75 ; Pulse: 64 ; Temp: 97.5 F (36.4 C) ; Resp: 16  Patient reports pain as mild, pain well controlled. No events throughout the night. Ready to be discharged home. Neurovascular intact, dorsiflexion/plantar flexion intact, incision: dressing C/D/I, no cellulitis present and compartment soft.   LABS  Basename  01/22/12 0410   HGB  8.8  HCT  26.7    Discharge Exam: General appearance: alert, cooperative and no distress Extremities: Homans sign is negative, no sign of DVT, no edema, redness or tenderness in the calves or thighs and no ulcers, gangrene or trophic changes  Disposition:  Home or Self Care with follow up in 2 weeks   Follow-up Information    Follow up with Shelda Pal, MD. In 2 weeks.  Contact information:   [facility] [address] [place] [address] [phone]          Discharge Orders    Future Appointments: Provider: Department: Dept Phone: Center:   03/10/2012 9:00 AM Krista Blue [facility] [phone] None   03/10/2012 9:30 AM Victorino December, MD [facility] [phone] None     Future Orders Please Complete By Expires   Diet - low sodium heart healthy      Call MD / Call 911      Comments:   If you experience chest pain or shortness of breath, CALL 911 and be transported to the hospital emergency room.  If you develope a fever above 101 F, pus  (white drainage) or increased drainage or redness at the wound, or calf pain, call your surgeon's office.   Discharge instructions      Comments:   Maintain surgical dressing for 8 days, then replace with gauze and tape. Keep the area dry and clean until follow up. Follow up in 2 weeks at [facility]. Call with any questions or concerns.   Constipation Prevention      Comments:   Drink plenty of fluids.  Prune juice may be helpful.  You may use a stool softener, such as Colace (over the counter) 100 mg twice a day.  Use MiraLax (over the counter) for constipation as needed.   Increase activity slowly as tolerated      Change dressing      Comments:   Maintain surgical dressing for 8 days, then replace with 4x4 guaze and tape. Keep the area dry and clean.   TED hose      Comments:   Use stockings (TED hose) for 2 weeks on both leg(s).  You may remove them at night for sleeping.      Discharge Medication List as of 01/22/2012 10:13 AM    START taking these medications   Details  acetaminophen (TYLENOL) 325 MG tablet Take 2 tablets (650 mg total) by mouth every 6 (six) hours as needed (or Fever >/= 101)., Starting 01/22/2012, Until Discontinued, No Print    docusate sodium 100 MG CAPS Take 100 mg by mouth 2 (two) times daily., Starting 01/22/2012, Until Discontinued, No Print    ferrous sulfate 325 (65 FE) MG tablet Take 1 tablet (325 mg total) by mouth 3 (three) times daily after meals., Starting 01/22/2012, Until Discontinued, No Print    polyethylene glycol (MIRALAX / GLYCOLAX) packet Take 17 g by mouth 2 (two) times daily., Starting 01/22/2012, Until Discontinued, No Print    rivaroxaban (XARELTO) 10 MG TABS tablet Take 1 tablet (10 mg total) by mouth daily with breakfast., Starting 01/22/2012, Until Discontinued, Print    traMADol (ULTRAM) 50 MG tablet Take 1-2 tablets (50-100 mg total) by mouth every 6 (six) hours as needed for pain., Starting 01/22/2012, Until Discontinued,  Print      CONTINUE these medications which have CHANGED   Details  aspirin EC 325 MG tablet Take 1 tablet (325 mg total) by mouth 2 (two) times daily. X 4 weeks, Starting 01/22/2012, Until Discontinued, No Print    diphenhydrAMINE (BENADRYL) 25 mg capsule Take 1 capsule (25 mg total) by mouth every 6 (six) hours as needed. Denies use 01/14/12, Starting 01/22/2012, Until Sun 02/01/12, No Print      CONTINUE these medications which have NOT CHANGED   Details  losartan-hydrochlorothiazide (HYZAAR) 100-25 MG per tablet Take 1 tablet by mouth daily  before breakfast., Starting 09/24/2011, Until Discontinued, Historical Med    tamoxifen (NOLVADEX) 20 MG tablet Take 20 mg by mouth daily before breakfast., Starting 09/11/2011, Until Discontinued, Historical Med    Multiple Vitamin (MULTIVITAMIN WITH MINERALS) TABS Take 1 tablet by mouth daily., Until Discontinued, Historical Med         Signed: Anastasio Auerbach. Dalante Minus   PAC  01/23/2012, 6:14 PM

## 2012-01-26 DIAGNOSIS — Z96649 Presence of unspecified artificial hip joint: Secondary | ICD-10-CM | POA: Diagnosis not present

## 2012-01-26 DIAGNOSIS — IMO0001 Reserved for inherently not codable concepts without codable children: Secondary | ICD-10-CM | POA: Diagnosis not present

## 2012-01-26 DIAGNOSIS — Z471 Aftercare following joint replacement surgery: Secondary | ICD-10-CM | POA: Diagnosis not present

## 2012-01-26 DIAGNOSIS — D62 Acute posthemorrhagic anemia: Secondary | ICD-10-CM | POA: Diagnosis not present

## 2012-01-28 DIAGNOSIS — Z96649 Presence of unspecified artificial hip joint: Secondary | ICD-10-CM | POA: Diagnosis not present

## 2012-01-28 DIAGNOSIS — IMO0001 Reserved for inherently not codable concepts without codable children: Secondary | ICD-10-CM | POA: Diagnosis not present

## 2012-01-28 DIAGNOSIS — D62 Acute posthemorrhagic anemia: Secondary | ICD-10-CM | POA: Diagnosis not present

## 2012-01-28 DIAGNOSIS — Z471 Aftercare following joint replacement surgery: Secondary | ICD-10-CM | POA: Diagnosis not present

## 2012-01-30 DIAGNOSIS — Z96649 Presence of unspecified artificial hip joint: Secondary | ICD-10-CM | POA: Diagnosis not present

## 2012-01-30 DIAGNOSIS — IMO0001 Reserved for inherently not codable concepts without codable children: Secondary | ICD-10-CM | POA: Diagnosis not present

## 2012-01-30 DIAGNOSIS — Z471 Aftercare following joint replacement surgery: Secondary | ICD-10-CM | POA: Diagnosis not present

## 2012-01-30 DIAGNOSIS — D62 Acute posthemorrhagic anemia: Secondary | ICD-10-CM | POA: Diagnosis not present

## 2012-02-02 DIAGNOSIS — Z96649 Presence of unspecified artificial hip joint: Secondary | ICD-10-CM | POA: Diagnosis not present

## 2012-02-02 DIAGNOSIS — IMO0001 Reserved for inherently not codable concepts without codable children: Secondary | ICD-10-CM | POA: Diagnosis not present

## 2012-02-02 DIAGNOSIS — D62 Acute posthemorrhagic anemia: Secondary | ICD-10-CM | POA: Diagnosis not present

## 2012-02-02 DIAGNOSIS — Z471 Aftercare following joint replacement surgery: Secondary | ICD-10-CM | POA: Diagnosis not present

## 2012-02-04 DIAGNOSIS — Z471 Aftercare following joint replacement surgery: Secondary | ICD-10-CM | POA: Diagnosis not present

## 2012-02-04 DIAGNOSIS — Z96649 Presence of unspecified artificial hip joint: Secondary | ICD-10-CM | POA: Diagnosis not present

## 2012-02-04 DIAGNOSIS — D62 Acute posthemorrhagic anemia: Secondary | ICD-10-CM | POA: Diagnosis not present

## 2012-02-04 DIAGNOSIS — IMO0001 Reserved for inherently not codable concepts without codable children: Secondary | ICD-10-CM | POA: Diagnosis not present

## 2012-02-06 DIAGNOSIS — IMO0001 Reserved for inherently not codable concepts without codable children: Secondary | ICD-10-CM | POA: Diagnosis not present

## 2012-02-06 DIAGNOSIS — Z96649 Presence of unspecified artificial hip joint: Secondary | ICD-10-CM | POA: Diagnosis not present

## 2012-02-06 DIAGNOSIS — D62 Acute posthemorrhagic anemia: Secondary | ICD-10-CM | POA: Diagnosis not present

## 2012-02-06 DIAGNOSIS — Z471 Aftercare following joint replacement surgery: Secondary | ICD-10-CM | POA: Diagnosis not present

## 2012-02-10 ENCOUNTER — Ambulatory Visit (INDEPENDENT_AMBULATORY_CARE_PROVIDER_SITE_OTHER): Payer: Medicare Other

## 2012-02-10 DIAGNOSIS — Z23 Encounter for immunization: Secondary | ICD-10-CM

## 2012-03-03 DIAGNOSIS — M169 Osteoarthritis of hip, unspecified: Secondary | ICD-10-CM | POA: Diagnosis not present

## 2012-03-10 ENCOUNTER — Other Ambulatory Visit (HOSPITAL_BASED_OUTPATIENT_CLINIC_OR_DEPARTMENT_OTHER): Payer: Medicare Other | Admitting: Lab

## 2012-03-10 ENCOUNTER — Ambulatory Visit (HOSPITAL_BASED_OUTPATIENT_CLINIC_OR_DEPARTMENT_OTHER): Payer: Medicare Other | Admitting: Adult Health

## 2012-03-10 ENCOUNTER — Telehealth: Payer: Self-pay | Admitting: *Deleted

## 2012-03-10 ENCOUNTER — Encounter: Payer: Self-pay | Admitting: Adult Health

## 2012-03-10 VITALS — BP 163/80 | HR 85 | Temp 98.9°F | Resp 20 | Ht 64.0 in | Wt 238.5 lb

## 2012-03-10 DIAGNOSIS — C50419 Malignant neoplasm of upper-outer quadrant of unspecified female breast: Secondary | ICD-10-CM

## 2012-03-10 DIAGNOSIS — C50911 Malignant neoplasm of unspecified site of right female breast: Secondary | ICD-10-CM

## 2012-03-10 DIAGNOSIS — C50919 Malignant neoplasm of unspecified site of unspecified female breast: Secondary | ICD-10-CM

## 2012-03-10 LAB — CBC WITH DIFFERENTIAL/PLATELET
Basophils Absolute: 0.1 10*3/uL (ref 0.0–0.1)
HCT: 35.7 % (ref 34.8–46.6)
HGB: 11.2 g/dL — ABNORMAL LOW (ref 11.6–15.9)
LYMPH%: 17 % (ref 14.0–49.7)
MONO#: 0.7 10*3/uL (ref 0.1–0.9)
NEUT%: 72.9 % (ref 38.4–76.8)
Platelets: 249 10*3/uL (ref 145–400)
WBC: 10.3 10*3/uL (ref 3.9–10.3)
lymph#: 1.8 10*3/uL (ref 0.9–3.3)

## 2012-03-10 LAB — COMPREHENSIVE METABOLIC PANEL (CC13)
ALT: 12 U/L (ref 0–55)
BUN: 23 mg/dL (ref 7.0–26.0)
CO2: 24 mEq/L (ref 22–29)
Calcium: 9 mg/dL (ref 8.4–10.4)
Chloride: 105 mEq/L (ref 98–107)
Creatinine: 1.2 mg/dL — ABNORMAL HIGH (ref 0.6–1.1)
Total Bilirubin: 0.33 mg/dL (ref 0.20–1.20)

## 2012-03-10 NOTE — Telephone Encounter (Signed)
Gave patient appointment for 08-2012 

## 2012-03-10 NOTE — Patient Instructions (Addendum)
Doing well.  No sign of recurrence.  We will see you back in 6 months.  Please call us if you have any questions or concerns.    

## 2012-03-10 NOTE — Progress Notes (Signed)
OFFICE PROGRESS NOTE   [name], MD [address] [place] [address] Dr. Almond Lint Dr. Charlann Boxer  DIAGNOSIS: 66 year old female with stage I invasive lobular carcinoma of the left breast  PRIOR THERAPY:  #1 patient received neoadjuvant tamoxifen for about 10 months followed by lumpectomy and then radiation to the right breast from 06/03/2010 to 07/12/2010.  #2 patient was then started on tamoxifen after completion of radiation. She subsequently developed radiation recall from tamoxifen apparently.   #3 patient was begun on Aromasin 25 mg daily starting in August 2012. However she developed significant problems from the Aromasin including myalgias and arthralgias. And on her last visit back in November 2012 the Aromasin was discontinued.  #4 patient went back on tamoxifen 20 mg daily and she remains on this at this time and is tolerating it very well.  CURRENT THERAPY:  Tamoxifen 20 mg daily  INTERVAL HISTORY: Kristen Escobar 66 y.o. female returns for followup visit today.  She is doing very well.  She had total hip replacements on 12/16/11 and 01/20/12.  She tolerated the procedure well, and had no complications.  She is feeling well, she occasionally has hot flashes, but these are short lived.  We discussed health maintenance as below.    Past Medical History  Diagnosis Date  . Hypertension   . Obesity   . Cancer 03/2009    breast- rt  . Peripheral vascular disease 1995    PT DEVELOPED CIRCULATION PROBLEMS IN BOTH HANDS AND GANGRENE OF BOTH INDEX FINGERS--REQUIRING AMPUTATION OF THE INDEX FINGERS AND VASCULAR SURGERY.  PT TOLD HER PROBLEMS RELATED TO SMOKING.   NO OTHER PROBLEMS SINCE.  Marland Kitchen Arthritis     ALLERGIES:   has no known allergies.  MEDICATIONS:  Current Outpatient Prescriptions  Medication Sig Dispense Refill  . acetaminophen (TYLENOL) 325 MG tablet Take 2 tablets (650 mg total) by mouth every 6 (six) hours as needed (or Fever >/= 101).        Marland Kitchen aspirin EC 325 MG tablet Take 1 tablet (325 mg total) by mouth 2 (two) times daily. X 4 weeks  60 tablet  0  . diphenhydrAMINE (BENADRYL) 25 mg capsule Take 25 mg by mouth every 6 (six) hours as needed. Denies use 01/14/12      . diphenhydrAMINE (BENADRYL) 25 mg capsule Take 1 capsule (25 mg total) by mouth every 6 (six) hours as needed. Denies use 01/14/12  30 capsule    . docusate sodium 100 MG CAPS Take 100 mg by mouth 2 (two) times daily.  10 capsule    . ferrous sulfate 325 (65 FE) MG tablet Take 1 tablet (325 mg total) by mouth 3 (three) times daily after meals.      Marland Kitchen losartan-hydrochlorothiazide (HYZAAR) 100-25 MG per tablet Take 1 tablet by mouth daily before breakfast.      . Multiple Vitamin (MULTIVITAMIN WITH MINERALS) TABS Take 1 tablet by mouth daily.      . polyethylene glycol (MIRALAX / GLYCOLAX) packet Take 17 g by mouth 2 (two) times daily.  14 each    . rivaroxaban (XARELTO) 10 MG TABS tablet Take 1 tablet (10 mg total) by mouth daily with breakfast.  14 tablet  0  . tamoxifen (NOLVADEX) 20 MG tablet Take 20 mg by mouth daily before breakfast.      . traMADol (ULTRAM) 50 MG tablet Take 1-2 tablets (50-100 mg total) by mouth every 6 (six) hours as needed for pain.  100 tablet  0  SURGICAL HISTORY:  Past Surgical History  Procedure Date  . Tubal ligation   . Amputation     partial amputation of both index fingers  . Vascular surgery     both hands  . Total hip arthroplasty 12/16/2011    right hip  . Breast surgery 2011    lumpectomy with node sampling- RIGHT  . Total hip arthroplasty 01/20/2012    Procedure: TOTAL HIP ARTHROPLASTY ANTERIOR APPROACH;  Surgeon: Shelda Pal, MD;  Location: [facility];  Service: Orthopedics;  Laterality: Left;    REVIEW OF SYSTEMS:   General: fatigue (-), night sweats (-), fever (-), pain (-) Lymph: palpable nodes (-) HEENT: vision changes (-), mucositis (-), gum bleeding (-), epistaxis (-) Cardiovascular: chest pain (-), palpitations  (-) Pulmonary: shortness of breath (-), dyspnea on exertion (-), cough (-), hemoptysis (-) GI:  Early satiety (-), melena (-), dysphagia (-), nausea/vomiting (-), diarrhea (-) GU: dysuria (-), hematuria (-), incontinence (-) Musculoskeletal: joint swelling (-), joint pain (-), back pain (-) Neuro: weakness (-), numbness (-), headache (-), confusion (-) Skin: Rash (-), lesions (-), dryness (-) Psych: depression (-), suicidal/homicidal ideation (-), feeling of hopelessness (-)  Health Maintenance  Mammogram: 11/2011 Colonoscopy: 2008, 10 year f/u recommended Bone Density Scan: n/a Pap Smear: unknown, several years ago Eye Exam: 2 years ago Vitamin D Level: n/a Lipid Panel: 09/2011   PHYSICAL EXAMINATION:  BP 163/80  Pulse 85  Temp 98.9 F (37.2 C) (Oral)  Resp 20  Ht 5\' 4"  (1.626 m)  Wt 238 lb 8 oz (108.183 kg)  BMI 40.94 kg/m2 General: Patient is a well appearing female in no acute distress HEENT: PERRLA, sclerae anicteric no conjunctival pallor, MMM Neck: supple, no palpable adenopathy Lungs: clear to auscultation bilaterally, no wheezes, rhonchi, or rales Cardiovascular: regular rate rhythm, S1, S2, no murmurs, rubs or gallops Abdomen: Soft, non-tender, non-distended, normoactive bowel sounds, no HSM Extremities: warm and well perfused, no clubbing, cyanosis, or edema Skin: No rashes or lesions Neuro: Non-focal ECOG PERFORMANCE STATUS: 1 - Symptomatic but completely ambulatory Breast exam: right breast lumpectomy site without nodularity, radiation changes noted throughout the breast.  Left breast without masses or nodularity.   LABORATORY DATA: Lab Results  Component Value Date   WBC 10.3 03/10/2012   HGB 11.2* 03/10/2012   HCT 35.7 03/10/2012   MCV 81.0 03/10/2012   PLT 249 03/10/2012      Chemistry      Component Value Date/Time   NA 135 01/22/2012 0410   NA 142 05/02/2009 1517   K 4.3 01/22/2012 0410   K 4.1 05/02/2009 1517   CL 102 01/22/2012 0410   CL 100  05/02/2009 1517   CO2 24 01/22/2012 0410   CO2 29 05/02/2009 1517   BUN 30* 01/22/2012 0410   BUN 19 05/02/2009 1517   CREATININE 1.39* 01/22/2012 0410   CREATININE 0.9 05/02/2009 1517      Component Value Date/Time   CALCIUM 8.4 01/22/2012 0410   CALCIUM 9.0 05/02/2009 1517   ALKPHOS 48 09/11/2011 0825   ALKPHOS 65 05/02/2009 1517   AST 13 09/11/2011 0825   AST 24 05/02/2009 1517   ALT 12 09/11/2011 0825   BILITOT 0.6 09/11/2011 0825   BILITOT 0.60 05/02/2009 1517       RADIOGRAPHIC STUDIES:  No results found.  ASSESSMENT: 66 year old female with:  1.  stage I invasive lobular carcinoma of the right breast originally diagnosed in December 2010. She then went on to receive neoadjuvant tamoxifen  from January 2011 to October 2011. This was then followed by a lumpectomy on 03/28/2010. Post lumpectomy patient received radiation therapy from 06/03/2010 to 07/12/2010.  #2 patient is now on tamoxifen 20 mg daily and she is tolerating this quite well.  #3 right hip arthritis, s/p bilateral hip replacements.    PLAN:   #1 Ms. Balsam will continue tamoxifen 20 mg daily a total of 5 years of therapy is planned. Doing well.  No sign of recurrence.  #2 She is doing very well.  We discussed her health maintnenace and I recommended her have a pap smear.    #3 We will see her back in 6 months.  All questions were answered. The patient knows to call the clinic with any problems, questions or concerns. We can certainly see the patient much sooner if necessary.  I spent 25 minutes counseling the patient face to face. The total time spent in the appointment was 30 minutes.  This case was reviewed with Dr. Welton Flakes.  Cherie Ouch Lyn Hollingshead, NP Medical Oncology [facility] Phone: [phone]    03/10/2012, 9:31 AM

## 2012-08-05 ENCOUNTER — Telehealth: Payer: Self-pay | Admitting: *Deleted

## 2012-08-05 NOTE — Telephone Encounter (Signed)
sw pt made her aware that we will be closed 5/26. gv appt for 11/01/12. Pt is aware...td

## 2012-08-22 ENCOUNTER — Encounter: Payer: Self-pay | Admitting: Radiation Oncology

## 2012-08-22 NOTE — Progress Notes (Signed)
  Radiation Oncology         [phone]) [phone] ________________________________  Name: Bonnye Halle MRN: [id number]  Date: 08/22/2012  DOB: 1945-09-04  On-Call Telephone Contact:  I received a message to call this patient and returned the phone call.  At the time that she contacted me, I did not have access to the electronic medical record. She related that she had been diagnosed with a radiation recall reaction 21 months ago. At that time, she developed redness and warmth of the breast. Initially, she was treated with antibiotics and ultimately responded to prednisone at the recommendation of Dr. Michell Heinrich. This past weekend, she describes having travel to [place]. On the way home, she developed redness and warmth of the treated breast with a sharp demarcation around the borders of the radiation treatment field. She relates that she's not take any chemotherapy recently or any new medications. She requested a prescription for steroids.  I telephoned a prescription for a Medrol dose pack to the [facility] on [address].  I advised the patient that if symptoms worsen to call back or consider proceeding to the emergency room for further evaluation.  ________________________________  Artist Pais Kathrynn Running, M.D.

## 2012-08-31 ENCOUNTER — Telehealth: Payer: Self-pay | Admitting: *Deleted

## 2012-08-31 NOTE — Telephone Encounter (Signed)
Called patient home,spoke woith Mr. Miguez, he stated after taking the medrol pack, her redness cleared up after 2 days, ", he had  A virus 2 days before his wife got a fever, this is first time that she got a fever from a virus and they feel this was the call for the redness on her breast, "everything is fine, no need for a follow up, thanked [place] for calling back and checking up,apologized that I hadn't called earlier, but he stated"if she still had the redness on Monday we woldv'e called for an appt with Dr.Wentworth' 3:01 PM

## 2012-09-01 ENCOUNTER — Other Ambulatory Visit: Payer: Self-pay | Admitting: Oncology

## 2012-09-02 ENCOUNTER — Ambulatory Visit
Admission: RE | Admit: 2012-09-02 | Discharge: 2012-09-02 | Disposition: A | Discharge status: Home / Self Care | Payer: Medicare Other | Source: Ambulatory Visit | Attending: Radiation Oncology | Admitting: Radiation Oncology

## 2012-09-02 ENCOUNTER — Encounter: Payer: Self-pay | Admitting: Radiation Oncology

## 2012-09-02 ENCOUNTER — Telehealth: Payer: Self-pay | Admitting: *Deleted

## 2012-09-02 VITALS — BP 162/74 | HR 87 | Temp 98.1°F | Resp 20 | Wt 247.0 lb

## 2012-09-02 DIAGNOSIS — C50919 Malignant neoplasm of unspecified site of unspecified female breast: Secondary | ICD-10-CM | POA: Diagnosis not present

## 2012-09-02 DIAGNOSIS — C50911 Malignant neoplasm of unspecified site of right female breast: Secondary | ICD-10-CM

## 2012-09-02 HISTORY — DX: Personal history of irradiation: Z92.3

## 2012-09-02 MED ORDER — DOXYCYCLINE HYCLATE 100 MG PO TABS: 100.0000 mg | ORAL_TABLET | Freq: Two times a day (BID) | ORAL | Status: DC

## 2012-09-02 MED ORDER — DOXYCYCLINE HYCLATE 100 MG PO TABS
100.0000 mg | ORAL_TABLET | Freq: Two times a day (BID) | ORAL | Status: DC
Start: 2012-09-02 — End: 2012-09-02

## 2012-09-02 MED ORDER — PREDNISONE (PAK) 5 MG PO TABS: ORAL_TABLET | ORAL | Status: DC

## 2012-09-02 NOTE — Progress Notes (Signed)
Department of Radiation Oncology  Phone:  [phone] Fax:        [phone]   Name: Kristen Escobar MRN: [id number]  DOB: Jun 23, 1945  Date: 09/02/2012  Follow Up Visit Note Diagnosis: T2N0 Invasive Lobular Carcinoma   Summary of treatment: Neoadjuvant tamoxifen therapy from January 2011 01/20/2010, right lumpectomy in December 2011 revealing [id number] invasive lobular carcinoma ER/PR positive with zero out of four lymph nodes positive, reexcision revealing lobular carcinoma in situ, radiation to a total dose of 60 gray completed 07/12/2010, tamoxifen until July 2012 Aromasin August 2012 02/21/2011, currently on tamoxifen 20 mg daily  Interval History: Kristen Escobar presents today for followup.  She was hospitalized last summer for a radiation recall reaction. She was treated with IV antibiotics while in the hospital and when her symptoms do not improve we started her on steroids. This significantly improved her rash. This episode happened after she had traveled to [place] and had nausea vomiting and diarrhea. A workup for recurrent breast cancer was performed and was negative. She has had negative mammograms in the interim as well. At that time we attributed it to tamoxifen so she came off the tamoxifen. She was unable to tolerate the Aromasin so it back on tamoxifen. She and her husband have travel back and forth to [place] several times since last summer and she has not had any difficulties. Her husband had nausea vomiting and diarrhea for 24 hours about 2 weeks ago. They traveled to [place] after this.. She started feeling feverish and nauseated about their second day there. They returned home. Upon returning home her breast was noted to be red and warm similar to the way it had appeared last summer. They called Dr. Kathrynn Running who was the on-call doctor on Saturday. He gave him a Medrol Dosepak. On the Monday the redness had resolved and she was back to normal. She began running a fever last night up  to 103. She called Dr. Mitzi Hansen who recommended taking Tylenol and following up with me this morning. Her fever has been gone since last night. Her breast is actually feeling better however she has developed warmth and erythema over her right anterior abdominal wall extending posteriorly towards her back. This has not happened previously. She feels this area around her abdomenis stable although her breast is getting better. Her breast is actually less painful and is peeling similar to the way it did last time.  Allergies: No Known Allergies Medications:  Current Outpatient Prescriptions  Medication Sig Dispense Refill  . acetaminophen (TYLENOL) 325 MG tablet Take 2 tablets (650 mg total) by mouth every 6 (six) hours as needed (or Fever >/= 101).      Marland Kitchen aspirin EC 325 MG tablet Take 1 tablet (325 mg total) by mouth 2 (two) times daily. X 4 weeks  60 tablet  0  . losartan-hydrochlorothiazide (HYZAAR) 100-25 MG per tablet Take 1 tablet by mouth daily before breakfast.      . Multiple Vitamin (MULTIVITAMIN WITH MINERALS) TABS Take 1 tablet by mouth daily.      . tamoxifen (NOLVADEX) 20 MG tablet Take 20 mg by mouth daily before breakfast.      . doxycycline (VIBRA-TABS) 100 MG tablet Take 1 tablet (100 mg total) by mouth 2 (two) times daily.  14 tablet  3  . predniSONE (STERAPRED UNI-PAK) 5 MG TABS Take 6 tablets on day one, 5 and a half tablets on day 2, 5 tablets on day 3, 4 and a half tablets  on day 4, 4 tablets on day 5, 3 and a half tablets on day 6, 3 tablets on day 7, 2 and a half tablets on day 8, 2 tablets on day 9, 1 and a half tablets on day 10, 1 tablet on day 11, half a tablet on day 12.  50 tablet  3   No current facility-administered medications for this encounter.    Physical Exam:  Filed Vitals:   09/02/12 1110  BP: 162/74  Pulse: 87  Temp: 98.1 F (36.7 C)  Resp: 20   > extending from the inframammary fold to almost her waistline. Extending midline towards the chest and lateral E.  to her back. Tender to palpation. The breast is less pink. There is dry skin over the breast which is peeling.  IMPRESSION: Kristen Escobar is a 67 y.o. female with skin erythema now extending outside the radiation treatment portals after travel to [place] and a gastrointestinal illness  PLAN:  I put Anaelle on a two-week steroid taper. I've also treated her with doxycycline in case there is a MRSA component to this. I guess we can also consider a fungal infection in the differential. Her husband is a patient of Karlyn Agee at [facility] dermatology and requested an appointment with him. I think that a very reasonable thing to do. I'm not sure that this rash will continue for much longer while she was on the steroids so I have taken pictures of including them in her medical chart. The main question would be is her something we can start earlier so that it doesn't reach this point and clearly its worse the second time and outside the radiation treatment portals. I don't think this is related to her tamoxifen and she should continue this medication. I've asked her to call me if she gets worse. I have not scheduled followup with her but of course she and her husband know that we will be calling her next week to check in on her.    Lurline Hare, MD

## 2012-09-02 NOTE — Telephone Encounter (Signed)
Husband called and stated that mrs. Hemmer came home after watching grandkids with a fever of 103 and redness on treated breast,down to her abdomen and around side of her back;  They called at 630pm and spoke with Dr.Moody who sugessted they take tylenol and if fever didn't stay down to go to the ED,  If fever stayed down to call in am and make appt to see Dr. Michell Heinrich this am,  patients fever did go down, and stay down , so I transferred call to Clydie Braun to have patient come in earliest time that MD has open 8:31 AM

## 2012-09-02 NOTE — Progress Notes (Signed)
Follow up  Right breast  Radiation completed 07/12/10 60 GY x30 fx Pat called this am requesting f/u appt, after calling last night and speaking with Dr.Moody, patient had fever 103 and redness on treated previously  right breast  And on abdomen and around side , took tylenol per MD suggestion,  fever stayed down, so they didn't go to the ED, called and requested to come see Dr.Wentworth this am, on tamoxifen 20mg  daily , patient alert, oriented x3, right breast and all down right side and back hot, angry looking erythema, tenderness around nipple"It's just hot", skin intact, no fever today 98.1, 162/74, 87,20 vitals

## 2012-09-02 NOTE — Telephone Encounter (Signed)
CALLED PATIENT TO INFORM OF APPT. WITH DAN JONES ON 09-20-12- ARRIVAL TIME - 4:00 PM, SPOKE WITH PATIENT AND SHE IS AWARE OF THIS APPT.

## 2012-09-02 NOTE — Addendum Note (Signed)
Encounter addended by: Lurline Hare, MD on: 09/02/2012 12:56 PM<BR>     Documentation filed: Clinical Notes

## 2012-09-06 ENCOUNTER — Telehealth: Payer: Self-pay | Admitting: *Deleted

## 2012-09-06 NOTE — Telephone Encounter (Signed)
Will try to call pt tomorrow

## 2012-09-06 NOTE — Telephone Encounter (Signed)
Called patient home phone, left voice message  Asking how her skin was and if the prednisone rx is helping, asked to call me back at her convience Will try again tomorrow if I haven't heard from her 9:50 AM

## 2012-09-07 ENCOUNTER — Telehealth: Payer: Self-pay | Admitting: *Deleted

## 2012-09-07 ENCOUNTER — Other Ambulatory Visit: Payer: Self-pay | Admitting: Radiation Oncology

## 2012-09-07 DIAGNOSIS — C50911 Malignant neoplasm of unspecified site of right female breast: Secondary | ICD-10-CM

## 2012-09-07 NOTE — Telephone Encounter (Signed)
Called patient home again today [phone],    voice machine came on, left message asking status of patient ,asked for return call to let us know if Kristen Escobar is better 10:22 AM

## 2012-09-08 DIAGNOSIS — L570 Actinic keratosis: Secondary | ICD-10-CM | POA: Diagnosis not present

## 2012-09-08 DIAGNOSIS — Q828 Other specified congenital malformations of skin: Secondary | ICD-10-CM | POA: Diagnosis not present

## 2012-09-08 DIAGNOSIS — L0390 Cellulitis, unspecified: Secondary | ICD-10-CM | POA: Diagnosis not present

## 2012-09-13 ENCOUNTER — Ambulatory Visit: Payer: Medicare Other | Admitting: Oncology

## 2012-09-13 ENCOUNTER — Other Ambulatory Visit: Payer: Medicare Other | Admitting: Lab

## 2012-09-29 ENCOUNTER — Other Ambulatory Visit: Payer: Self-pay | Admitting: Dermatology

## 2012-09-29 ENCOUNTER — Telehealth: Payer: Self-pay | Admitting: *Deleted

## 2012-09-29 DIAGNOSIS — D485 Neoplasm of uncertain behavior of skin: Secondary | ICD-10-CM | POA: Diagnosis not present

## 2012-09-29 DIAGNOSIS — L03319 Cellulitis of trunk, unspecified: Secondary | ICD-10-CM | POA: Diagnosis not present

## 2012-09-29 DIAGNOSIS — L0291 Cutaneous abscess, unspecified: Secondary | ICD-10-CM | POA: Diagnosis not present

## 2012-09-29 DIAGNOSIS — Z79899 Other long term (current) drug therapy: Secondary | ICD-10-CM | POA: Diagnosis not present

## 2012-09-29 DIAGNOSIS — L0390 Cellulitis, unspecified: Secondary | ICD-10-CM | POA: Diagnosis not present

## 2012-09-29 NOTE — Telephone Encounter (Signed)
Mr. Shambaugh called and stated Kristen Escobar had  A fever last night another flareup  and called Dr.Dan Yetta Barre and was seen today there and Biopsy was obtained and blood work, prednisone is on hold for now per MD, he just wanted Dr.Wentworth to be informed they are aware she is on vacation this week, will e-mail Dr. Michell Heinrich,  12:02 PM

## 2012-10-24 ENCOUNTER — Other Ambulatory Visit: Payer: Self-pay | Admitting: Internal Medicine

## 2012-10-25 ENCOUNTER — Other Ambulatory Visit: Payer: Self-pay | Admitting: Internal Medicine

## 2012-10-26 ENCOUNTER — Other Ambulatory Visit: Payer: Self-pay | Admitting: Internal Medicine

## 2012-10-27 ENCOUNTER — Ambulatory Visit: Payer: Medicare Other | Admitting: Oncology

## 2012-10-27 ENCOUNTER — Other Ambulatory Visit: Payer: Medicare Other | Admitting: Lab

## 2012-11-01 ENCOUNTER — Other Ambulatory Visit (HOSPITAL_BASED_OUTPATIENT_CLINIC_OR_DEPARTMENT_OTHER): Payer: Medicare Other | Admitting: Lab

## 2012-11-01 ENCOUNTER — Encounter: Payer: Self-pay | Admitting: Oncology

## 2012-11-01 ENCOUNTER — Telehealth: Payer: Self-pay | Admitting: *Deleted

## 2012-11-01 ENCOUNTER — Ambulatory Visit (HOSPITAL_BASED_OUTPATIENT_CLINIC_OR_DEPARTMENT_OTHER): Payer: Medicare Other | Admitting: Oncology

## 2012-11-01 VITALS — BP 155/83 | HR 73 | Temp 98.0°F | Resp 20 | Ht 64.0 in | Wt 252.3 lb

## 2012-11-01 DIAGNOSIS — C50919 Malignant neoplasm of unspecified site of unspecified female breast: Secondary | ICD-10-CM

## 2012-11-01 DIAGNOSIS — C50911 Malignant neoplasm of unspecified site of right female breast: Secondary | ICD-10-CM

## 2012-11-01 LAB — COMPREHENSIVE METABOLIC PANEL (CC13)
CO2: 26 mEq/L (ref 22–29)
Calcium: 9 mg/dL (ref 8.4–10.4)
Chloride: 106 mEq/L (ref 98–109)
Creatinine: 1.1 mg/dL (ref 0.6–1.1)
Glucose: 136 mg/dl (ref 70–140)
Total Bilirubin: 0.2 mg/dL (ref 0.20–1.20)

## 2012-11-01 MED ORDER — TAMOXIFEN CITRATE 20 MG PO TABS: 20.0000 mg | ORAL_TABLET | Freq: Every day | ORAL | Status: DC

## 2012-11-01 NOTE — Patient Instructions (Addendum)
Continue tamoxifen 20 mg daily  We will see you back in 6 months for follow up

## 2012-11-01 NOTE — Progress Notes (Signed)
OFFICE PROGRESS NOTE   [name], MD [address] [place] [address] Dr. Almond Lint Dr. Charlann Boxer  DIAGNOSIS: 67 year old female with stage I invasive lobular carcinoma of the left breast  PRIOR THERAPY:  #1 patient received neoadjuvant tamoxifen for about 10 months followed by lumpectomy and then radiation to the right breast from 06/03/2010 to 07/12/2010.  #2 patient was then started on tamoxifen after completion of radiation. She subsequently developed radiation recall from tamoxifen apparently.   #3 patient was begun on Aromasin 25 mg daily starting in August 2012. However she developed significant problems from the Aromasin including myalgias and arthralgias. And on her last visit back in November 2012 the Aromasin was discontinued.  #4 patient went back on tamoxifen 20 mg daily and she remains on this at this time and is tolerating it very well.  CURRENT THERAPY:  Tamoxifen 20 mg daily  INTERVAL HISTORY: Kristen Escobar 67 y.o. female returns for followup visit today.  She is doing very well.  She had total hip replacements on 12/16/11 and 01/20/12.  She tolerated the procedure well, and had no complications.  She is feeling well, she occasionally has hot flashes, but these are short lived.  We discussed health maintenance as below.    Past Medical History  Diagnosis Date  . Hypertension   . Obesity   . Cancer 03/2009    breast- rt  . Peripheral vascular disease 1995    PT DEVELOPED CIRCULATION PROBLEMS IN BOTH HANDS AND GANGRENE OF BOTH INDEX FINGERS--REQUIRING AMPUTATION OF THE INDEX FINGERS AND VASCULAR SURGERY.  PT TOLD HER PROBLEMS RELATED TO SMOKING.   NO OTHER PROBLEMS SINCE.  Marland Kitchen Arthritis   . History of radiation therapy 07/12/10,completed    right breast 60 Gy x30 fx    ALLERGIES:  has No Known Allergies.  MEDICATIONS:  Current Outpatient Prescriptions  Medication Sig Dispense Refill  . losartan-hydrochlorothiazide (HYZAAR) 100-25 MG per  tablet TAKE ONE TABLET BY MOUTH EVERY DAY  90 tablet  0  . Multiple Vitamin (MULTIVITAMIN WITH MINERALS) TABS Take 1 tablet by mouth daily.      . tamoxifen (NOLVADEX) 20 MG tablet Take 20 mg by mouth daily before breakfast.      . acetaminophen (TYLENOL) 325 MG tablet Take 2 tablets (650 mg total) by mouth every 6 (six) hours as needed (or Fever >/= 101).      Marland Kitchen aspirin EC 325 MG tablet Take 1 tablet (325 mg total) by mouth 2 (two) times daily. X 4 weeks  60 tablet  0   No current facility-administered medications for this visit.    SURGICAL HISTORY:  Past Surgical History  Procedure Laterality Date  . Tubal ligation    . Amputation      partial amputation of both index fingers  . Vascular surgery      both hands  . Total hip arthroplasty  12/16/2011    right hip  . Breast surgery  2011    lumpectomy with node sampling- RIGHT  . Total hip arthroplasty  01/20/2012    Procedure: TOTAL HIP ARTHROPLASTY ANTERIOR APPROACH;  Surgeon: Shelda Pal, MD;  Location: [facility];  Service: Orthopedics;  Laterality: Left;    REVIEW OF SYSTEMS:   General: fatigue (-), night sweats (-), fever (-), pain (-) Lymph: palpable nodes (-) HEENT: vision changes (-), mucositis (-), gum bleeding (-), epistaxis (-) Cardiovascular: chest pain (-), palpitations (-) Pulmonary: shortness of breath (-), dyspnea on exertion (-), cough (-), hemoptysis (-)  GI:  Early satiety (-), melena (-), dysphagia (-), nausea/vomiting (-), diarrhea (-) GU: dysuria (-), hematuria (-), incontinence (-) Musculoskeletal: joint swelling (-), joint pain (-), back pain (-) Neuro: weakness (-), numbness (-), headache (-), confusion (-) Skin: Rash (-), lesions (-), dryness (-) Psych: depression (-), suicidal/homicidal ideation (-), feeling of hopelessness (-)  Health Maintenance  Mammogram: 11/2011 Colonoscopy: 2008, 10 year f/u recommended Bone Density Scan: n/a Pap Smear: unknown, several years ago Eye Exam: 2 years ago Vitamin  D Level: n/a Lipid Panel: 09/2011   PHYSICAL EXAMINATION:  BP 155/83  Pulse 73  Temp(Src) 98 F (36.7 C) (Oral)  Resp 20  Ht 5\' 4"  (1.626 m)  Wt 252 lb 5 oz (114.448 kg)  BMI 43.29 kg/m2 General: Patient is a well appearing female in no acute distress HEENT: PERRLA, sclerae anicteric no conjunctival pallor, MMM Neck: supple, no palpable adenopathy Lungs: clear to auscultation bilaterally, no wheezes, rhonchi, or rales Cardiovascular: regular rate rhythm, S1, S2, no murmurs, rubs or gallops Abdomen: Soft, non-tender, non-distended, normoactive bowel sounds, no HSM Extremities: warm and well perfused, no clubbing, cyanosis, or edema Skin: No rashes or lesions Neuro: Non-focal ECOG PERFORMANCE STATUS: 1 - Symptomatic but completely ambulatory Breast exam: right breast lumpectomy site without nodularity, radiation changes noted throughout the breast.  Left breast without masses or nodularity.   LABORATORY DATA: Lab Results  Component Value Date   WBC 10.3 03/10/2012   HGB 11.2* 03/10/2012   HCT 35.7 03/10/2012   MCV 81.0 03/10/2012   PLT 249 03/10/2012      Chemistry      Component Value Date/Time   NA 141 11/01/2012 1104   NA 135 01/22/2012 0410   NA 142 05/02/2009 1517   K 4.0 11/01/2012 1104   K 4.3 01/22/2012 0410   K 4.1 05/02/2009 1517   CL 105 03/10/2012 0859   CL 102 01/22/2012 0410   CL 100 05/02/2009 1517   CO2 26 11/01/2012 1104   CO2 24 01/22/2012 0410   CO2 29 05/02/2009 1517   BUN 24.3 11/01/2012 1104   BUN 30* 01/22/2012 0410   BUN 19 05/02/2009 1517   CREATININE 1.1 11/01/2012 1104   CREATININE 1.39* 01/22/2012 0410   CREATININE 0.9 05/02/2009 1517      Component Value Date/Time   CALCIUM 9.0 11/01/2012 1104   CALCIUM 8.4 01/22/2012 0410   CALCIUM 9.0 05/02/2009 1517   ALKPHOS 48 11/01/2012 1104   ALKPHOS 48 09/11/2011 0825   ALKPHOS 65 05/02/2009 1517   AST 13 11/01/2012 1104   AST 13 09/11/2011 0825   AST 24 05/02/2009 1517   ALT 13 11/01/2012 1104   ALT 12  09/11/2011 0825   BILITOT 0.20 11/01/2012 1104   BILITOT 0.6 09/11/2011 0825   BILITOT 0.60 05/02/2009 1517       RADIOGRAPHIC STUDIES:  No results found.  ASSESSMENT: 67 year old female with:  1.  stage I invasive lobular carcinoma of the right breast originally diagnosed in December 2010. She then went on to receive neoadjuvant tamoxifen from January 2011 to October 2011. This was then followed by a lumpectomy on 03/28/2010. Post lumpectomy patient received radiation therapy from 06/03/2010 to 07/12/2010.  #2 patient is now on tamoxifen 20 mg daily and she is tolerating this quite well.  #3 right hip arthritis, s/p bilateral hip replacements.    PLAN:   #1 Ms. Gottlieb will continue tamoxifen 20 mg daily a total of 5 years of therapy is planned. Doing well.  No sign of recurrence.  #2 She is doing very well.  We discussed her health maintnenace and I recommended her have a pap smear.    #3 We will see her back in 6 months.  All questions were answered. The patient knows to call the clinic with any problems, questions or concerns. We can certainly see the patient much sooner if necessary.  I spent 25 minutes counseling the patient face to face. The total time spent in the appointment was 30 minutes.  Drue Second, MD Medical/Oncology [facility] [phone] (beeper) [phone] (Office)  11/01/2012, 1:13 PM

## 2012-11-01 NOTE — Telephone Encounter (Signed)
appts made and printed...td 

## 2012-11-09 ENCOUNTER — Other Ambulatory Visit: Payer: Self-pay

## 2012-11-09 ENCOUNTER — Other Ambulatory Visit: Payer: Self-pay | Admitting: Oncology

## 2012-11-09 DIAGNOSIS — Z853 Personal history of malignant neoplasm of breast: Secondary | ICD-10-CM

## 2012-11-09 DIAGNOSIS — Z9889 Other specified postprocedural states: Secondary | ICD-10-CM

## 2012-12-10 ENCOUNTER — Ambulatory Visit (INDEPENDENT_AMBULATORY_CARE_PROVIDER_SITE_OTHER): Payer: Medicare Other | Admitting: Internal Medicine

## 2012-12-10 ENCOUNTER — Encounter: Payer: Self-pay | Admitting: Internal Medicine

## 2012-12-10 VITALS — BP 142/84 | HR 87 | Temp 98.9°F | Wt 249.0 lb

## 2012-12-10 DIAGNOSIS — C50919 Malignant neoplasm of unspecified site of unspecified female breast: Secondary | ICD-10-CM

## 2012-12-10 DIAGNOSIS — Z0000 Encounter for general adult medical examination without abnormal findings: Secondary | ICD-10-CM | POA: Diagnosis not present

## 2012-12-10 DIAGNOSIS — C50911 Malignant neoplasm of unspecified site of right female breast: Secondary | ICD-10-CM

## 2012-12-10 DIAGNOSIS — I10 Essential (primary) hypertension: Secondary | ICD-10-CM | POA: Diagnosis not present

## 2012-12-10 DIAGNOSIS — M1990 Unspecified osteoarthritis, unspecified site: Secondary | ICD-10-CM

## 2012-12-10 NOTE — Progress Notes (Signed)
Patient ID: Kristen Escobar, female   DOB: 17-Jan-1946, 67 y.o.   MRN: [id number]  Subjective:    Patient ID: Kristen Escobar, female    DOB: 10/15/1945, 67 y.o.   MRN: [id number]  HPI  67 year-old patient who is seen today for a preventive health examination. She has a history of right breast cancer and is followed closely by oncology. She has significant osteoarthritis and is status post bilateral total hip replacement surgery in the fall last year. She's had a recent eye examination 2 months ago She has had a recent bone scan and has had a colonoscopy in 2008. She is scheduled for a followup mammogram next month  Past Medical History  Diagnosis Date  . Hypertension   . Obesity   . Cancer 03/2009    breast- rt  . Peripheral vascular disease 1995    PT DEVELOPED CIRCULATION PROBLEMS IN BOTH HANDS AND GANGRENE OF BOTH INDEX FINGERS--REQUIRING AMPUTATION OF THE INDEX FINGERS AND VASCULAR SURGERY.  PT TOLD HER PROBLEMS RELATED TO SMOKING.   NO OTHER PROBLEMS SINCE.  Marland Kitchen Arthritis   . History of radiation therapy 07/12/10,completed    right breast 60 Gy x30 fx    History   Social History  . Marital Status: Married    Spouse Name: N/A    Number of Children: N/A  . Years of Education: N/A   Occupational History  . Not on file.   Social History Main Topics  . Smoking status: Former Smoker -- 1.00 packs/day for 20 years    Quit date: 04/21/1993  . Smokeless tobacco: Never Used  . Alcohol Use: Yes     Comment: OCCAS - MAYBE ONCE A MONTH  . Drug Use: No  . Sexual Activity: Yes   Other Topics Concern  . Not on file   Social History Narrative  . No narrative on file    Past Surgical History  Procedure Laterality Date  . Tubal ligation    . Amputation      partial amputation of both index fingers  . Vascular surgery      both hands  . Total hip arthroplasty  12/16/2011    right hip  . Breast surgery  2011    lumpectomy with node sampling- RIGHT  . Total hip arthroplasty  01/20/2012     Procedure: TOTAL HIP ARTHROPLASTY ANTERIOR APPROACH;  Surgeon: Shelda Pal, MD;  Location: [facility];  Service: Orthopedics;  Laterality: Left;    Family History  Problem Relation Age of Onset  . Cancer Mother     pt unaware of what kind  . Hearing loss Mother   . Coronary artery disease Father   . Diabetes Father   . Diabetes Sister   . Cancer Sister     breast  . Coronary artery disease Brother   . Coronary artery disease Brother     No Known Allergies  Current Outpatient Prescriptions on File Prior to Visit  Medication Sig Dispense Refill  . acetaminophen (TYLENOL) 325 MG tablet Take 2 tablets (650 mg total) by mouth every 6 (six) hours as needed (or Fever >/= 101).      Marland Kitchen aspirin EC 325 MG tablet Take 1 tablet (325 mg total) by mouth 2 (two) times daily. X 4 weeks  60 tablet  0  . losartan-hydrochlorothiazide (HYZAAR) 100-25 MG per tablet TAKE ONE TABLET BY MOUTH EVERY DAY  90 tablet  0  . Multiple Vitamin (MULTIVITAMIN WITH MINERALS) TABS Take 1 tablet by mouth  daily.      . tamoxifen (NOLVADEX) 20 MG tablet Take 1 tablet (20 mg total) by mouth daily.  90 tablet  6   No current facility-administered medications on file prior to visit.    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 Risk factors, based on past  M,S,F history-  cardiovascular risk factors include hypertension  2.  Physical activities: Fairly active physically although limited by right hip osteoarthritis does swim in her outdoor pool daily  3.  Depression/mood: No history depression or mood disorder 4.  Hearing: No deficits  5.  ADL's: Independent in all aspects of daily living  6.  Fall risk: Moderate due to her weight and hip arthritis  7.  Home safety: No problems identified  8.  Height weight, and visual acuity; height and weight stable no change in visual acuity  9.  Counseling: Weight loss encouraged heart healthy diet recommended. Low-salt diet recommended  10. Lab orders based on risk factors: Will check a TSH and lipid profile  11. Referral : Followup orthopedic and oncology 12. Care plan: Heart healthy diet weight loss encouraged  13. Cognitive assessment: Alert and oriented with normal affect. No cognitive dysfunction.      Review of Systems  Constitutional: Negative for fever, appetite change, fatigue and unexpected weight change.  HENT: Negative for hearing loss, ear pain, nosebleeds, congestion, sore throat, mouth sores, trouble swallowing, neck stiffness, dental problem, voice change, sinus pressure and tinnitus.   Eyes: Negative for photophobia, pain, redness and visual disturbance.  Respiratory: Negative for cough, chest tightness and shortness of breath.   Cardiovascular: Negative for chest pain, palpitations and leg swelling.  Gastrointestinal: Negative for nausea, vomiting, abdominal pain, diarrhea, constipation, blood in stool, abdominal distention and rectal pain.  Genitourinary: Negative for dysuria, urgency, frequency, hematuria, flank pain, vaginal bleeding, vaginal discharge, difficulty urinating, genital sores, vaginal pain, menstrual problem and pelvic pain.   Musculoskeletal: Positive for gait problem. Negative for back pain and arthralgias.  Skin: Negative for rash.  Neurological: Negative for dizziness, syncope, speech difficulty, weakness, light-headedness, numbness and headaches.  Hematological: Negative for adenopathy. Does not bruise/bleed easily.  Psychiatric/Behavioral: Negative for suicidal ideas, behavioral problems, self-injury, dysphoric mood and agitation. The patient is not nervous/anxious.        Objective:   Physical Exam  Constitutional: She is oriented to person, place, and time. She appears well-developed and well-nourished.  Obese. Weight 249 Blood pressure 140/80  HENT:  Head: Normocephalic and atraumatic.  Right Ear: External ear normal.  Left Ear: External ear normal.  Mouth/Throat: Oropharynx is clear  and moist.  Eyes: Conjunctivae and EOM are normal.  Neck: Normal range of motion. Neck supple. No JVD present. No thyromegaly present.  Cardiovascular: Normal rate, regular rhythm, normal heart sounds and intact distal pulses.   No murmur heard. Pulmonary/Chest: Effort normal and breath sounds normal. She has no wheezes. She has no rales.  Abdominal: Soft. Bowel sounds are normal. She exhibits no distension and no mass. There is no tenderness. There is no rebound and no guarding.  Genitourinary: Vagina normal.  Musculoskeletal: Normal range of motion. She exhibits no edema and no tenderness.  Some  missing partial digits of the hands  Neurological: She is alert and oriented to person, place, and time. She has normal reflexes. No cranial nerve deficit. She exhibits normal muscle tone. Coordination normal.  Skin: Skin is warm and dry. No rash noted.  Psychiatric: She has a normal mood and affect. Her behavior is normal.          Assessment & Plan:    Preventive health Hypertension Breast cancer Status post bilateral total hip replacement  Weight loss encouraged Recheck in 6 months or as needed Medications  renewed

## 2012-12-10 NOTE — Patient Instructions (Signed)

## 2012-12-13 ENCOUNTER — Ambulatory Visit
Admission: RE | Admit: 2012-12-13 | Discharge: 2012-12-13 | Disposition: A | Discharge status: Home / Self Care | Payer: Medicare Other | Source: Ambulatory Visit | Attending: Oncology | Admitting: Oncology

## 2012-12-13 DIAGNOSIS — Z9889 Other specified postprocedural states: Secondary | ICD-10-CM

## 2012-12-13 DIAGNOSIS — Z853 Personal history of malignant neoplasm of breast: Secondary | ICD-10-CM

## 2012-12-13 DIAGNOSIS — R928 Other abnormal and inconclusive findings on diagnostic imaging of breast: Secondary | ICD-10-CM | POA: Diagnosis not present

## 2013-01-18 ENCOUNTER — Other Ambulatory Visit: Payer: Self-pay | Admitting: *Deleted

## 2013-01-18 MED ORDER — LOSARTAN POTASSIUM-HCTZ 100-25 MG PO TABS: ORAL_TABLET | ORAL | Status: DC

## 2013-02-05 ENCOUNTER — Ambulatory Visit (INDEPENDENT_AMBULATORY_CARE_PROVIDER_SITE_OTHER): Payer: Medicare Other | Admitting: Family Medicine

## 2013-02-05 ENCOUNTER — Encounter: Payer: Self-pay | Admitting: Family Medicine

## 2013-02-05 VITALS — BP 138/70 | Temp 99.8°F | Wt 253.0 lb

## 2013-02-05 DIAGNOSIS — H8110 Benign paroxysmal vertigo, unspecified ear: Secondary | ICD-10-CM | POA: Diagnosis not present

## 2013-02-05 NOTE — Progress Notes (Signed)
  Subjective:    Patient ID: Kristen Escobar, female    DOB: 1945-04-29, 67 y.o.   MRN: [id number]  HPI Natalija is a 67 year old female who comes in today for evaluation of vertigo  She states these episodes started about 3 weeks ago. Last for a few seconds and goes away. Her worst episode was on Wednesday it lasted about 3 minutes. Each episodes that spontaneously with a sensation of the room spinning. She has no hearing loss. No history of trauma. She did not have a spell on Thursday but had 2 short spells on Friday. She does take a blood pressure medication BP 130/70  No previous history of vertigo and again no hearing loss   Review of Systems Review of systems otherwise negative    Objective:   Physical Exam Well-developed well-nourished female no acute distress vital signs stable she is afebrile examination HEENT was negative neck was supple cerebellar testing are all negative gait normal no nystagmus       Assessment & Plan:  Benign vertigo,,,,,,,,,,,,,,, reassured return to Dr. Kirtland Bouchard. When necessary

## 2013-02-05 NOTE — Patient Instructions (Addendum)
If you have other episodes of vertigo to prevent passing out lie down on the ground and put  Her feet up in the air  If the symptoms get worse become more severe or last longer than call Dr. Kirtland Bouchard. For followupBenign Positional Vertigo Vertigo means you feel like you or your surroundings are moving when they are not. Benign positional vertigo is the most common form of vertigo. Benign means that the cause of your condition is not serious. Benign positional vertigo is more common in older adults. CAUSES  Benign positional vertigo is the result of an upset in the labyrinth system. This is an area in the middle ear that helps control your balance. This may be caused by a viral infection, head injury, or repetitive motion. However, often no specific cause is found. SYMPTOMS  Symptoms of benign positional vertigo occur when you move your head or eyes in different directions. Some of the symptoms may include:  Loss of balance and falls.  Vomiting.  Blurred vision.  Dizziness.  Nausea.  Involuntary eye movements (nystagmus). DIAGNOSIS  Benign positional vertigo is usually diagnosed by physical exam. If the specific cause of your benign positional vertigo is unknown, your caregiver may perform imaging tests, such as magnetic resonance imaging (MRI) or computed tomography (CT). TREATMENT  Your caregiver may recommend movements or procedures to correct the benign positional vertigo. Medicines such as meclizine, benzodiazepines, and medicines for nausea may be used to treat your symptoms. In rare cases, if your symptoms are caused by certain conditions that affect the inner ear, you may need surgery. HOME CARE INSTRUCTIONS   Follow your caregiver's instructions.  Move slowly. Do not make sudden body or head movements.  Avoid driving.  Avoid operating heavy machinery.  Avoid performing any tasks that would be dangerous to you or others during a vertigo episode.  Drink enough fluids to keep your  urine clear or pale yellow. SEEK IMMEDIATE MEDICAL CARE IF:   You develop problems with walking, weakness, numbness, or using your arms, hands, or legs.  You have difficulty speaking.  You develop severe headaches.  Your nausea or vomiting continues or gets worse.  You develop visual changes.  Your family or friends notice any behavioral changes.  Your condition gets worse.  You have a fever.  You develop a stiff neck or sensitivity to light. MAKE SURE YOU:   Understand these instructions.  Will watch your condition.  Will get help right away if you are not doing well or get worse. Document Released: 01/13/2006 Document Revised: 06/30/2011 Document Reviewed: 12/26/2010 [facility] Patient Information 2014 [place], [place].

## 2013-03-01 ENCOUNTER — Ambulatory Visit (INDEPENDENT_AMBULATORY_CARE_PROVIDER_SITE_OTHER): Payer: Medicare Other

## 2013-03-01 DIAGNOSIS — Z23 Encounter for immunization: Secondary | ICD-10-CM

## 2013-05-02 ENCOUNTER — Encounter: Payer: Self-pay | Admitting: Oncology

## 2013-05-02 ENCOUNTER — Ambulatory Visit (HOSPITAL_BASED_OUTPATIENT_CLINIC_OR_DEPARTMENT_OTHER): Payer: Medicare Other | Admitting: Oncology

## 2013-05-02 ENCOUNTER — Telehealth: Payer: Self-pay | Admitting: Oncology

## 2013-05-02 ENCOUNTER — Other Ambulatory Visit (HOSPITAL_BASED_OUTPATIENT_CLINIC_OR_DEPARTMENT_OTHER): Payer: Medicare Other

## 2013-05-02 VITALS — BP 185/86 | HR 83 | Temp 98.6°F | Resp 20 | Ht 64.0 in | Wt 225.4 lb

## 2013-05-02 DIAGNOSIS — C50911 Malignant neoplasm of unspecified site of right female breast: Secondary | ICD-10-CM

## 2013-05-02 DIAGNOSIS — C50419 Malignant neoplasm of upper-outer quadrant of unspecified female breast: Secondary | ICD-10-CM

## 2013-05-02 DIAGNOSIS — M1610 Unilateral primary osteoarthritis, unspecified hip: Secondary | ICD-10-CM | POA: Diagnosis not present

## 2013-05-02 LAB — CBC WITH DIFFERENTIAL/PLATELET
BASO%: 1 % (ref 0.0–2.0)
BASOS ABS: 0.1 10*3/uL (ref 0.0–0.1)
EOS%: 3.6 % (ref 0.0–7.0)
Eosinophils Absolute: 0.3 10*3/uL (ref 0.0–0.5)
HCT: 39.2 % (ref 34.8–46.6)
HEMOGLOBIN: 13 g/dL (ref 11.6–15.9)
LYMPH#: 1.8 10*3/uL (ref 0.9–3.3)
LYMPH%: 21.9 % (ref 14.0–49.7)
MCH: 27.4 pg (ref 25.1–34.0)
MCHC: 33.1 g/dL (ref 31.5–36.0)
MCV: 83 fL (ref 79.5–101.0)
MONO#: 0.8 10*3/uL (ref 0.1–0.9)
MONO%: 9.1 % (ref 0.0–14.0)
NEUT#: 5.3 10*3/uL (ref 1.5–6.5)
NEUT%: 64.4 % (ref 38.4–76.8)
Platelets: 245 10*3/uL (ref 145–400)
RBC: 4.72 10*6/uL (ref 3.70–5.45)
RDW: 15.4 % — AB (ref 11.2–14.5)
WBC: 8.3 10*3/uL (ref 3.9–10.3)

## 2013-05-02 LAB — COMPREHENSIVE METABOLIC PANEL (CC13)
ALBUMIN: 3.4 g/dL — AB (ref 3.5–5.0)
ALT: 17 U/L (ref 0–55)
AST: 15 U/L (ref 5–34)
Alkaline Phosphatase: 51 U/L (ref 40–150)
Anion Gap: 12 mEq/L — ABNORMAL HIGH (ref 3–11)
BUN: 19.5 mg/dL (ref 7.0–26.0)
CALCIUM: 9.5 mg/dL (ref 8.4–10.4)
CHLORIDE: 104 meq/L (ref 98–109)
CO2: 23 mEq/L (ref 22–29)
Creatinine: 1.2 mg/dL — ABNORMAL HIGH (ref 0.6–1.1)
Glucose: 128 mg/dl (ref 70–140)
POTASSIUM: 4.1 meq/L (ref 3.5–5.1)
Sodium: 139 mEq/L (ref 136–145)
Total Bilirubin: 0.44 mg/dL (ref 0.20–1.20)
Total Protein: 7.4 g/dL (ref 6.4–8.3)

## 2013-05-02 MED ORDER — TAMOXIFEN CITRATE 20 MG PO TABS: 20.0000 mg | ORAL_TABLET | Freq: Every day | ORAL | Status: DC

## 2013-05-02 NOTE — Progress Notes (Signed)
OFFICE PROGRESS NOTE   Nyoka Cowden, MD [address] [place] [id number] Dr. Stark Vickrey Dr. Alvan Dame  DIAGNOSIS: 68 year old female with stage I invasive lobular carcinoma of the left breast  PRIOR THERAPY:  #1 patient received neoadjuvant tamoxifen for about 10 months followed by lumpectomy and then radiation to the right breast from 06/03/2010 to 07/12/2010.  #2 patient was then started on tamoxifen after completion of radiation. She subsequently developed radiation recall from tamoxifen apparently.   #3 patient was begun on Aromasin 25 mg daily starting in August 2012. However she developed significant problems from the Aromasin including myalgias and arthralgias. And on her last visit back in November 2012 the Aromasin was discontinued.  #4  tamoxifen 20 mg daily and she remains on this at this time and is tolerating it very well.  CURRENT THERAPY:  Tamoxifen 20 mg daily  INTERVAL HISTORY: Kristen Escobar 68 y.o. female returns for followup visit today.  She is doing very well.  Patient still has occasionally radiation recall but it is interfering with her activities of daily living. She had a recent mammogram performed that showed calcifications and postsurgical changes in the right breast. She will have a six-month followup in February.  She is feeling well, she occasionally has hot flashes, but these are short lived.  She has no nausea vomiting no fevers chills night sweats. She has no vaginal bleeding or discharge. No lower extremity swelling no cramping of the legs. She does tell me that her knee is bothering her and she is going to be seeing her orthopedic surgeon for possible workup of that. Remainder of the 10 point review of systems is negative.  Past Medical History  Diagnosis Date  . Hypertension   . Obesity   . Cancer 03/2009    breast- rt  . Peripheral vascular disease 1995    PT DEVELOPED CIRCULATION PROBLEMS IN BOTH HANDS AND GANGRENE OF BOTH  INDEX FINGERS--REQUIRING AMPUTATION OF THE INDEX FINGERS AND VASCULAR SURGERY.  PT TOLD HER PROBLEMS RELATED TO SMOKING.   NO OTHER PROBLEMS SINCE.  Marland Kitchen Arthritis   . History of radiation therapy 07/12/10,completed    right breast 60 Gy x30 fx    ALLERGIES:  has No Known Allergies.  MEDICATIONS:  Current Outpatient Prescriptions  Medication Sig Dispense Refill  . ibuprofen (ADVIL,MOTRIN) 200 MG tablet Take 200 mg by mouth every 6 (six) hours as needed (as needed).      . losartan-hydrochlorothiazide (HYZAAR) 100-25 MG per tablet TAKE ONE TABLET BY MOUTH EVERY DAY  90 tablet  3  . tamoxifen (NOLVADEX) 20 MG tablet Take 1 tablet (20 mg total) by mouth daily.  90 tablet  6   No current facility-administered medications for this visit.    SURGICAL HISTORY:  Past Surgical History  Procedure Laterality Date  . Tubal ligation    . Amputation      partial amputation of both index fingers  . Vascular surgery      both hands  . Total hip arthroplasty  12/16/2011    right hip  . Breast surgery  2011    lumpectomy with node sampling- RIGHT  . Total hip arthroplasty  01/20/2012    Procedure: TOTAL HIP ARTHROPLASTY ANTERIOR APPROACH;  Surgeon: Mauri Pole, MD;  Location: [facility];  Service: Orthopedics;  Laterality: Left;    REVIEW OF SYSTEMS:   General: fatigue (-), night sweats (-), fever (-), pain (-) Lymph: palpable nodes (-) HEENT: vision changes (-), mucositis (-),  gum bleeding (-), epistaxis (-) Cardiovascular: chest pain (-), palpitations (-) Pulmonary: shortness of breath (-), dyspnea on exertion (-), cough (-), hemoptysis (-) GI:  Early satiety (-), melena (-), dysphagia (-), nausea/vomiting (-), diarrhea (-) GU: dysuria (-), hematuria (-), incontinence (-) Musculoskeletal: joint swelling (-), joint pain (-), back pain (-) Neuro: weakness (-), numbness (-), headache (-), confusion (-) Skin: Rash (-), lesions (-), dryness (-) Psych: depression (-), suicidal/homicidal ideation (-),  feeling of hopelessness (-)  Health Maintenance  Mammogram: 11/2011 Colonoscopy: 2008, 10 year f/u recommended Bone Density Scan: n/a Pap Smear: unknown, several years ago Eye Exam: 2 years ago Vitamin D Level: n/a Lipid Panel: 09/2011   PHYSICAL EXAMINATION:  BP 185/86  Pulse 83  Temp(Src) 98.6 F (37 C) (Oral)  Resp 20  Ht 5\' 4"  (1.626 m)  Wt 225 lb 6.4 oz (102.241 kg)  BMI 38.67 kg/m2 General: Patient is a well appearing female in no acute distress HEENT: PERRLA, sclerae anicteric no conjunctival pallor, MMM Neck: supple, no palpable adenopathy Lungs: clear to auscultation bilaterally, no wheezes, rhonchi, or rales Cardiovascular: regular rate rhythm, S1, S2, no murmurs, rubs or gallops Abdomen: Soft, non-tender, non-distended, normoactive bowel sounds, no HSM Extremities: warm and well perfused, no clubbing, cyanosis, or edema Skin: No rashes or lesions Neuro: Non-focal ECOG PERFORMANCE STATUS: 1 - Symptomatic but completely ambulatory Breast exam: right breast lumpectomy site without nodularity, radiation changes noted throughout the breast.  Left breast without masses or nodularity.   LABORATORY DATA: Lab Results  Component Value Date   WBC 8.3 05/02/2013   HGB 13.0 05/02/2013   HCT 39.2 05/02/2013   MCV 83.0 05/02/2013   PLT 245 05/02/2013      Chemistry      Component Value Date/Time   NA 141 11/01/2012 1104   NA 135 01/22/2012 0410   NA 142 05/02/2009 1517   K 4.0 11/01/2012 1104   K 4.3 01/22/2012 0410   K 4.1 05/02/2009 1517   CL 105 03/10/2012 0859   CL 102 01/22/2012 0410   CL 100 05/02/2009 1517   CO2 26 11/01/2012 1104   CO2 24 01/22/2012 0410   CO2 29 05/02/2009 1517   BUN 24.3 11/01/2012 1104   BUN 30* 01/22/2012 0410   BUN 19 05/02/2009 1517   CREATININE 1.1 11/01/2012 1104   CREATININE 1.39* 01/22/2012 0410   CREATININE 0.9 05/02/2009 1517      Component Value Date/Time   CALCIUM 9.0 11/01/2012 1104   CALCIUM 8.4 01/22/2012 0410   CALCIUM 9.0 05/02/2009  1517   ALKPHOS 48 11/01/2012 1104   ALKPHOS 48 09/11/2011 0825   ALKPHOS 65 05/02/2009 1517   AST 13 11/01/2012 1104   AST 13 09/11/2011 0825   AST 24 05/02/2009 1517   ALT 13 11/01/2012 1104   ALT 12 09/11/2011 0825   ALT 17 05/02/2009 1517   BILITOT 0.20 11/01/2012 1104   BILITOT 0.6 09/11/2011 0825   BILITOT 0.60 05/02/2009 1517       RADIOGRAPHIC STUDIES:  No results found.  ASSESSMENT: 68 year old female with:  1.  stage I invasive lobular carcinoma of the right breast originally diagnosed in December 2010. She then went on to receive neoadjuvant tamoxifen from January 2011 to October 2011. This was then followed by a lumpectomy on 03/28/2010. Post lumpectomy patient received radiation therapy from 06/03/2010 to 07/12/2010.  #2 patient is now on tamoxifen 20 mg daily and she is tolerating this quite well.  #3 right hip arthritis, s/p  bilateral hip replacements.    PLAN:   #1 Ms. Walthour will continue tamoxifen 20 mg daily a total of 5 years of therapy is planned. Doing well.  No sign of recurrence.  #2 She is doing very well.  We discussed her health maintnenace and I recommended her have a pap smear.    #3 We will see her back in 6 months.  All questions were answered. The patient knows to call the clinic with any problems, questions or concerns. We can certainly see the patient much sooner if necessary.  I spent 25 minutes counseling the patient face to face. The total time spent in the appointment was 30 minutes.  Marcy Panning, MD Medical/Oncology [facility] [phone] (beeper) [phone] (Office)  05/02/2013, 9:51 AM

## 2013-05-26 DIAGNOSIS — M25559 Pain in unspecified hip: Secondary | ICD-10-CM | POA: Diagnosis not present

## 2013-05-26 DIAGNOSIS — Z96649 Presence of unspecified artificial hip joint: Secondary | ICD-10-CM | POA: Diagnosis not present

## 2013-05-26 DIAGNOSIS — M25569 Pain in unspecified knee: Secondary | ICD-10-CM | POA: Diagnosis not present

## 2013-06-27 ENCOUNTER — Other Ambulatory Visit: Payer: Self-pay | Admitting: Oncology

## 2013-06-27 DIAGNOSIS — Z9889 Other specified postprocedural states: Secondary | ICD-10-CM

## 2013-06-27 DIAGNOSIS — Z853 Personal history of malignant neoplasm of breast: Secondary | ICD-10-CM

## 2013-07-06 DIAGNOSIS — M25569 Pain in unspecified knee: Secondary | ICD-10-CM | POA: Diagnosis not present

## 2013-07-11 ENCOUNTER — Ambulatory Visit
Admission: RE | Admit: 2013-07-11 | Discharge: 2013-07-11 | Disposition: A | Discharge status: Home / Self Care | Payer: Medicare Other | Source: Ambulatory Visit | Attending: Oncology | Admitting: Oncology

## 2013-07-11 DIAGNOSIS — Z853 Personal history of malignant neoplasm of breast: Secondary | ICD-10-CM

## 2013-07-11 DIAGNOSIS — R928 Other abnormal and inconclusive findings on diagnostic imaging of breast: Secondary | ICD-10-CM | POA: Diagnosis not present

## 2013-07-11 DIAGNOSIS — Z9889 Other specified postprocedural states: Secondary | ICD-10-CM

## 2013-08-19 DIAGNOSIS — M25569 Pain in unspecified knee: Secondary | ICD-10-CM | POA: Diagnosis not present

## 2013-09-29 DIAGNOSIS — M1710 Unilateral primary osteoarthritis, unspecified knee: Secondary | ICD-10-CM | POA: Diagnosis not present

## 2013-09-29 DIAGNOSIS — M25569 Pain in unspecified knee: Secondary | ICD-10-CM | POA: Diagnosis not present

## 2013-10-07 DIAGNOSIS — M1710 Unilateral primary osteoarthritis, unspecified knee: Secondary | ICD-10-CM | POA: Diagnosis not present

## 2013-10-11 ENCOUNTER — Telehealth: Payer: Self-pay | Admitting: Hematology and Oncology

## 2013-10-11 NOTE — Telephone Encounter (Signed)
m °

## 2013-10-13 DIAGNOSIS — M25569 Pain in unspecified knee: Secondary | ICD-10-CM | POA: Diagnosis not present

## 2013-10-13 DIAGNOSIS — M1710 Unilateral primary osteoarthritis, unspecified knee: Secondary | ICD-10-CM | POA: Diagnosis not present

## 2013-10-24 ENCOUNTER — Other Ambulatory Visit: Payer: Medicare Other

## 2013-10-24 ENCOUNTER — Ambulatory Visit: Payer: Medicare Other | Admitting: Oncology

## 2013-11-17 ENCOUNTER — Other Ambulatory Visit: Payer: Self-pay | Admitting: Internal Medicine

## 2013-11-17 DIAGNOSIS — Z853 Personal history of malignant neoplasm of breast: Secondary | ICD-10-CM

## 2013-12-19 ENCOUNTER — Encounter (INDEPENDENT_AMBULATORY_CARE_PROVIDER_SITE_OTHER): Payer: Self-pay

## 2013-12-19 ENCOUNTER — Telehealth: Payer: Self-pay | Admitting: *Deleted

## 2013-12-19 ENCOUNTER — Other Ambulatory Visit: Payer: Self-pay | Admitting: Hematology and Oncology

## 2013-12-19 ENCOUNTER — Ambulatory Visit
Admission: RE | Admit: 2013-12-19 | Discharge: 2013-12-19 | Disposition: A | Discharge status: Home / Self Care | Payer: Medicare Other | Source: Ambulatory Visit | Attending: Internal Medicine | Admitting: Internal Medicine

## 2013-12-19 DIAGNOSIS — Z853 Personal history of malignant neoplasm of breast: Secondary | ICD-10-CM

## 2013-12-19 DIAGNOSIS — N6489 Other specified disorders of breast: Secondary | ICD-10-CM | POA: Diagnosis not present

## 2013-12-19 NOTE — Telephone Encounter (Signed)
Received mammogram from [facility]. Sent to scan.

## 2013-12-27 DIAGNOSIS — L565 Disseminated superficial actinic porokeratosis (DSAP): Secondary | ICD-10-CM | POA: Diagnosis not present

## 2013-12-27 DIAGNOSIS — L821 Other seborrheic keratosis: Secondary | ICD-10-CM | POA: Diagnosis not present

## 2013-12-27 DIAGNOSIS — D239 Other benign neoplasm of skin, unspecified: Secondary | ICD-10-CM | POA: Diagnosis not present

## 2013-12-27 DIAGNOSIS — L570 Actinic keratosis: Secondary | ICD-10-CM | POA: Diagnosis not present

## 2013-12-27 DIAGNOSIS — D2370 Other benign neoplasm of skin of unspecified lower limb, including hip: Secondary | ICD-10-CM | POA: Diagnosis not present

## 2014-01-09 ENCOUNTER — Other Ambulatory Visit: Payer: Self-pay | Admitting: Internal Medicine

## 2014-01-17 ENCOUNTER — Other Ambulatory Visit: Payer: Self-pay

## 2014-01-17 DIAGNOSIS — C50911 Malignant neoplasm of unspecified site of right female breast: Secondary | ICD-10-CM

## 2014-01-18 ENCOUNTER — Encounter: Payer: Self-pay | Admitting: Hematology and Oncology

## 2014-01-18 ENCOUNTER — Telehealth: Payer: Self-pay | Admitting: Hematology and Oncology

## 2014-01-18 ENCOUNTER — Other Ambulatory Visit (HOSPITAL_BASED_OUTPATIENT_CLINIC_OR_DEPARTMENT_OTHER): Payer: Medicare Other

## 2014-01-18 ENCOUNTER — Ambulatory Visit (HOSPITAL_BASED_OUTPATIENT_CLINIC_OR_DEPARTMENT_OTHER): Payer: Medicare Other | Admitting: Hematology and Oncology

## 2014-01-18 VITALS — BP 169/59 | HR 71 | Temp 97.8°F | Resp 19 | Ht 64.0 in | Wt 255.1 lb

## 2014-01-18 DIAGNOSIS — C50419 Malignant neoplasm of upper-outer quadrant of unspecified female breast: Secondary | ICD-10-CM

## 2014-01-18 DIAGNOSIS — C50911 Malignant neoplasm of unspecified site of right female breast: Secondary | ICD-10-CM

## 2014-01-18 DIAGNOSIS — Z170 Estrogen receptor positive status [ER+]: Secondary | ICD-10-CM | POA: Diagnosis not present

## 2014-01-18 DIAGNOSIS — C50411 Malignant neoplasm of upper-outer quadrant of right female breast: Secondary | ICD-10-CM

## 2014-01-18 LAB — COMPREHENSIVE METABOLIC PANEL (CC13)
ALBUMIN: 3.1 g/dL — AB (ref 3.5–5.0)
ALK PHOS: 50 U/L (ref 40–150)
ALT: 10 U/L (ref 0–55)
AST: 14 U/L (ref 5–34)
Anion Gap: 7 mEq/L (ref 3–11)
BUN: 15.8 mg/dL (ref 7.0–26.0)
CO2: 27 mEq/L (ref 22–29)
Calcium: 9.1 mg/dL (ref 8.4–10.4)
Chloride: 106 mEq/L (ref 98–109)
Creatinine: 1.2 mg/dL — ABNORMAL HIGH (ref 0.6–1.1)
Glucose: 128 mg/dl (ref 70–140)
POTASSIUM: 4 meq/L (ref 3.5–5.1)
SODIUM: 140 meq/L (ref 136–145)
TOTAL PROTEIN: 6.7 g/dL (ref 6.4–8.3)
Total Bilirubin: 0.37 mg/dL (ref 0.20–1.20)

## 2014-01-18 LAB — CBC WITH DIFFERENTIAL/PLATELET
BASO%: 0.5 % (ref 0.0–2.0)
Basophils Absolute: 0 10*3/uL (ref 0.0–0.1)
EOS%: 4.1 % (ref 0.0–7.0)
Eosinophils Absolute: 0.4 10*3/uL (ref 0.0–0.5)
HCT: 37.9 % (ref 34.8–46.6)
HGB: 12.3 g/dL (ref 11.6–15.9)
LYMPH%: 28.5 % (ref 14.0–49.7)
MCH: 27.8 pg (ref 25.1–34.0)
MCHC: 32.5 g/dL (ref 31.5–36.0)
MCV: 85.6 fL (ref 79.5–101.0)
MONO#: 0.7 10*3/uL (ref 0.1–0.9)
MONO%: 8.2 % (ref 0.0–14.0)
NEUT#: 5 10*3/uL (ref 1.5–6.5)
NEUT%: 58.7 % (ref 38.4–76.8)
Platelets: 218 10*3/uL (ref 145–400)
RBC: 4.43 10*6/uL (ref 3.70–5.45)
RDW: 14.4 % (ref 11.2–14.5)
WBC: 8.5 10*3/uL (ref 3.9–10.3)
lymph#: 2.4 10*3/uL (ref 0.9–3.3)

## 2014-01-18 NOTE — Progress Notes (Signed)
Patient Care Team: Marletta Lor, MD as PCP - [place], MD as Consulting Physician (Internal Medicine) Thea Silversmith, MD as Consulting Physician (Radiation Oncology)  DIAGNOSIS: Breast cancer of upper-outer quadrant of right female breast   Primary site: Breast (Right)   Staging method: AJCC 7th Edition   Pathologic: Stage IA (T1a, N0, cM0) signed by Stark Sessler, MD on 03/21/2011  9:46 AM   Summary: Stage IA (T1a, N0, cM0)   SUMMARY OF ONCOLOGIC HISTORY:   Breast cancer of upper-outer quadrant of right female breast   05/23/2009 - 03/27/2010 Anti-estrogen oral therapy Neoadjuvant antiestrogen therapy with tamoxifen for 10 months   03/28/2010 Surgery 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 Radiation recall related to tamoxifen   11/25/2010 -  Anti-estrogen oral therapy Aromasin 25 mg daily.myalgias and arthralgias stop November 2012 went back to tamoxifen 20 mg daily    CHIEF COMPLIANT: Six-month followup of history of breast cancer  INTERVAL HISTORY: Kristen Escobar is a 68 year old Caucasian lady with above-mentioned history of right breast invasive lobular cancer with some ductal features stage IA who was treated with neoadjuvant antiestrogen therapy with tamoxifen followed by lumpectomy and radiation. She has tried Aromasin but developed myalgias and had to switch back to tamoxifen. She has had a few episodes of radiation recall with episodes of redness in the breast accompanied by low-grade temperatures. Dr. Pablo Ledger has examined her and previously prescribed steroids but even without steroids the rash appears to have disappeared after one to 2 days. In fact the last time she did not even let us know it went away in 2 days. She also said dermatologist for this previously  who evaluated her with a biopsy and determined that it was completely normal.  She is tolerating tamoxifen extremely well without any myalgias arthralgias. She denies any vaginal bleeding or any issues taking tamoxifen.  REVIEW OF SYSTEMS:   Constitutional: Denies fevers, chills or abnormal weight loss Eyes: Denies blurriness of vision Ears, nose, mouth, throat, and face: Denies mucositis or sore throat Respiratory: Denies cough, dyspnea or wheezes Cardiovascular: Denies palpitation, chest discomfort or lower extremity swelling Gastrointestinal:  Denies nausea, heartburn or change in bowel habits Skin: Denies abnormal skin rashes Lymphatics: Denies new lymphadenopathy or easy bruising Neurological:Denies numbness, tingling or new weaknesses Behavioral/Psych: Mood is stable, no new changes  Breast:  denies any pain or lumps or nodules in either breasts All other systems were reviewed with the patient and are negative.  I have reviewed the past medical history, past surgical history, social history and family history with the patient and they are unchanged from previous note.  ALLERGIES:  has No Known Allergies.  MEDICATIONS:  Current Outpatient Prescriptions  Medication Sig Dispense Refill  . ibuprofen (ADVIL,MOTRIN) 200 MG tablet Take 200 mg by mouth every 6 (six) hours as needed (as needed).      . losartan-hydrochlorothiazide (HYZAAR) 100-25 MG per tablet TAKE 1 TABLET BY MOUTH EVERY DAY  90 tablet  1  . tamoxifen (NOLVADEX) 20 MG tablet Take 1 tablet (20 mg total) by mouth daily.  90 tablet  6   No current facility-administered medications for this visit.    PHYSICAL EXAMINATION: ECOG PERFORMANCE STATUS: 0 - Asymptomatic  Filed Vitals:   01/18/14 1039  BP: 169/59  Pulse: 71  Temp: 97.8 F (  36.6 C)  Resp: 19   Filed Weights   01/18/14 1039  Weight: 255 lb 1 oz (115.696 kg)    GENERAL:alert, no distress and comfortable SKIN: skin color, texture, turgor are normal, no  rashes or significant lesions EYES: normal, Conjunctiva are pink and non-injected, sclera clear OROPHARYNX:no exudate, no erythema and lips, buccal mucosa, and tongue normal  NECK: supple, thyroid normal size, non-tender, without nodularity LYMPH:  no palpable lymphadenopathy in the cervical, axillary or inguinal LUNGS: clear to auscultation and percussion with normal breathing effort HEART: regular rate & rhythm and no murmurs and no lower extremity edema ABDOMEN:abdomen soft, non-tender and normal bowel sounds Musculoskeletal:no cyanosis of digits and no clubbing  NEURO: alert & oriented x 3 with fluent speech, no focal motor/sensory deficits BREAST: No palpable masses or nodules in either right or left breasts. No palpable axillary supraclavicular or infraclavicular adenopathy no breast tenderness or nipple discharge.   LABORATORY DATA:  I have reviewed the data as listed   Chemistry      Component Value Date/Time   NA 139 05/02/2013 0915   NA 135 01/22/2012 0410   NA 142 05/02/2009 1517   K 4.1 05/02/2013 0915   K 4.3 01/22/2012 0410   K 4.1 05/02/2009 1517   CL 105 03/10/2012 0859   CL 102 01/22/2012 0410   CL 100 05/02/2009 1517   CO2 23 05/02/2013 0915   CO2 24 01/22/2012 0410   CO2 29 05/02/2009 1517   BUN 19.5 05/02/2013 0915   BUN 30* 01/22/2012 0410   BUN 19 05/02/2009 1517   CREATININE 1.2* 05/02/2013 0915   CREATININE 1.39* 01/22/2012 0410   CREATININE 0.9 05/02/2009 1517      Component Value Date/Time   CALCIUM 9.5 05/02/2013 0915   CALCIUM 8.4 01/22/2012 0410   CALCIUM 9.0 05/02/2009 1517   ALKPHOS 51 05/02/2013 0915   ALKPHOS 48 09/11/2011 0825   ALKPHOS 65 05/02/2009 1517   AST 15 05/02/2013 0915   AST 13 09/11/2011 0825   AST 24 05/02/2009 1517   ALT 17 05/02/2013 0915   ALT 12 09/11/2011 0825   ALT 17 05/02/2009 1517   BILITOT 0.44 05/02/2013 0915   BILITOT 0.6 09/11/2011 0825   BILITOT 0.60 05/02/2009 1517       Lab Results  Component Value Date   WBC 8.5 01/18/2014    HGB 12.3 01/18/2014   HCT 37.9 01/18/2014   MCV 85.6 01/18/2014   PLT 218 01/18/2014   NEUTROABS 5.0 01/18/2014     RADIOGRAPHIC STUDIES: I have personally reviewed the radiology reports and agreed with their findings. Mammograms in August 2015 were reviewed and they were normal   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 She is tolerating tamoxifen extremely well without any major problems or concerns. She gets periodic radiation recall reactions of the breast. She has had a total of 4 episodes until now. Each episode lasts 1-2 days associated with redness and low-grade temperature and it goes away spontaneously. Previous biopsies with dermatology have been negative.  Surveillance: I reviewed the recent mammograms which were normal. Today's breast exam was without any abnormalities except for the tissue density associated with the site of prior surgery and radiation. She will have annual mammograms in August of the year and we will see her in 6 months for physical exam.  Survivorship:  Patient was instructed that she needs to have annual GYN exams while on tamoxifen. I discussed the importance of exercise and eating more fruits and vegetables and less red meat.   No orders of the defined types were placed in this encounter.   The patient has a good understanding of the overall plan. she agrees with it. She will call with any problems that may develop before her next visit here.  I spent 20 minutes counseling the patient face to face. The total time spent in the appointment was 25 minutes and more than 50% was on counseling and review of test results    Rulon Eisenmenger, MD 01/18/2014 11:06 AM

## 2014-01-18 NOTE — Assessment & Plan Note (Signed)
Right breast invasive lobular cancer T1, N0, M0 stage IA status post neoadjuvant tamoxifen followed by lumpectomy and radiation followed by tamoxifen therapy. She is tolerating tamoxifen extremely well without any major problems or concerns. She gets periodic radiation recall reactions of the breast. She has had a total of 4 episodes until now. Each episode lasts 1-2 days associated with redness and low-grade temperature and it goes away spontaneously. Previous biopsies with dermatology have been negative.  Surveillance: I reviewed the recent mammograms which were normal. Today's breast exam was without any abnormalities except for the tissue density associated with the site of prior surgery and radiation. She will have annual mammograms in August of the year and we will see her in 6 months for physical exam.  Survivorship: Patient was instructed that she needs to have annual GYN exams while on tamoxifen. I discussed the importance of exercise and eating more fruits and vegetables and less red meat.

## 2014-01-18 NOTE — Telephone Encounter (Signed)
per pof to sch pt appt-gave pt copy of sch °

## 2014-02-06 ENCOUNTER — Ambulatory Visit (INDEPENDENT_AMBULATORY_CARE_PROVIDER_SITE_OTHER): Payer: Medicare Other | Admitting: Internal Medicine

## 2014-02-06 ENCOUNTER — Encounter: Payer: Self-pay | Admitting: Internal Medicine

## 2014-02-06 VITALS — BP 140/70 | HR 88 | Temp 98.4°F | Resp 20 | Ht 62.25 in | Wt 248.0 lb

## 2014-02-06 DIAGNOSIS — Z0000 Encounter for general adult medical examination without abnormal findings: Secondary | ICD-10-CM

## 2014-02-06 DIAGNOSIS — I10 Essential (primary) hypertension: Secondary | ICD-10-CM | POA: Diagnosis not present

## 2014-02-06 DIAGNOSIS — M150 Primary generalized (osteo)arthritis: Secondary | ICD-10-CM

## 2014-02-06 DIAGNOSIS — Z23 Encounter for immunization: Secondary | ICD-10-CM | POA: Diagnosis not present

## 2014-02-06 DIAGNOSIS — C50411 Malignant neoplasm of upper-outer quadrant of right female breast: Secondary | ICD-10-CM

## 2014-02-06 DIAGNOSIS — M159 Polyosteoarthritis, unspecified: Secondary | ICD-10-CM

## 2014-02-06 NOTE — Patient Instructions (Signed)
Limit your sodium (Salt) intake    It is important that you exercise regularly, at least 20 minutes 3 to 4 times per week.  If you develop chest pain or shortness of breath seek  medical attention.  You need to lose weight.  Consider a lower calorie diet and regular exercise.  Return in 6 months for follow-up Health Maintenance Adopting a healthy lifestyle and getting preventive care can go a long way to promote health and wellness. Talk with your health care provider about what schedule of regular examinations is right for you. This is a good chance for you to check in with your provider about disease prevention and staying healthy. In between checkups, there are plenty of things you can do on your own. Experts have done a lot of research about which lifestyle changes and preventive measures are most likely to keep you healthy. Ask your health care provider for more information. WEIGHT AND DIET  Eat a healthy diet  Be sure to include plenty of vegetables, fruits, low-fat dairy products, and lean protein.  Do not eat a lot of foods high in solid fats, added sugars, or salt.  Get regular exercise. This is one of the most important things you can do for your health.  Most adults should exercise for at least 150 minutes each week. The exercise should increase your heart rate and make you sweat (moderate-intensity exercise).  Most adults should also do strengthening exercises at least twice a week. This is in addition to the moderate-intensity exercise.  Maintain a healthy weight  Body mass index (BMI) is a measurement that can be used to identify possible weight problems. It estimates body fat based on height and weight. Your health care provider can help determine your BMI and help you achieve or maintain a healthy weight.  For females 37 years of age and older:   A BMI below 18.5 is considered underweight.  A BMI of 18.5 to 24.9 is normal.  A BMI of 25 to 29.9 is considered  overweight.  A BMI of 30 and above is considered obese.  Watch levels of cholesterol and blood lipids  You should start having your blood tested for lipids and cholesterol at 67 years of age, then have this test every 5 years.  You may need to have your cholesterol levels checked more often if:  Your lipid or cholesterol levels are high.  You are older than 67 years of age.  You are at high risk for heart disease.  CANCER SCREENING   Lung Cancer  Lung cancer screening is recommended for adults 36-54 years old who are at high risk for lung cancer because of a history of smoking.  A yearly low-dose CT scan of the lungs is recommended for people who:  Currently smoke.  Have quit within the past 15 years.  Have at least a 30-pack-year history of smoking. A pack year is smoking an average of one pack of cigarettes a day for 1 year.  Yearly screening should continue until it has been 15 years since you quit.  Yearly screening should stop if you develop a health problem that would prevent you from having lung cancer treatment.  Breast Cancer  Practice breast self-awareness. This means understanding how your breasts normally appear and feel.  It also means doing regular breast self-exams. Let your health care provider know about any changes, no matter how small.  If you are in your 20s or 30s, you should have a clinical breast  exam (CBE) by a health care provider every 1-3 years as part of a regular health exam.  If you are 78 or older, have a CBE every year. Also consider having a breast X-ray (mammogram) every year.  If you have a family history of breast cancer, talk to your health care provider about genetic screening.  If you are at high risk for breast cancer, talk to your health care provider about having an MRI and a mammogram every year.  Breast cancer gene (BRCA) assessment is recommended for women who have family members with BRCA-related cancers. BRCA-related  cancers include:  Breast.  Ovarian.  Tubal.  Peritoneal cancers.  Results of the assessment will determine the need for genetic counseling and BRCA1 and BRCA2 testing. Cervical Cancer Routine pelvic examinations to screen for cervical cancer are no longer recommended for nonpregnant women who are considered low risk for cancer of the pelvic organs (ovaries, uterus, and vagina) and who do not have symptoms. A pelvic examination may be necessary if you have symptoms including those associated with pelvic infections. Ask your health care provider if a screening pelvic exam is right for you.   The Pap test is the screening test for cervical cancer for women who are considered at risk.  If you had a hysterectomy for a problem that was not cancer or a condition that could lead to cancer, then you no longer need Pap tests.  If you are older than 65 years, and you have had normal Pap tests for the past 10 years, you no longer need to have Pap tests.  If you have had past treatment for cervical cancer or a condition that could lead to cancer, you need Pap tests and screening for cancer for at least 20 years after your treatment.  If you no longer get a Pap test, assess your risk factors if they change (such as having a new sexual partner). This can affect whether you should start being screened again.  Some women have medical problems that increase their chance of getting cervical cancer. If this is the case for you, your health care provider may recommend more frequent screening and Pap tests.  The human papillomavirus (HPV) test is another test that may be used for cervical cancer screening. The HPV test looks for the virus that can cause cell changes in the cervix. The cells collected during the Pap test can be tested for HPV.  The HPV test can be used to screen women 62 years of age and older. Getting tested for HPV can extend the interval between normal Pap tests from three to five  years.  An HPV test also should be used to screen women of any age who have unclear Pap test results.  After 67 years of age, women should have HPV testing as often as Pap tests.  Colorectal Cancer  This type of cancer can be detected and often prevented.  Routine colorectal cancer screening usually begins at 67 years of age and continues through 67 years of age.  Your health care provider may recommend screening at an earlier age if you have risk factors for colon cancer.  Your health care provider may also recommend using home test kits to check for hidden blood in the stool.  A small camera at the end of a tube can be used to examine your colon directly (sigmoidoscopy or colonoscopy). This is done to check for the earliest forms of colorectal cancer.  Routine screening usually begins at age 36.  Direct examination of the colon should be repeated every 5-10 years through 67 years of age. However, you may need to be screened more often if early forms of precancerous polyps or small growths are found. Skin Cancer  Check your skin from head to toe regularly.  Tell your health care provider about any new moles or changes in moles, especially if there is a change in a mole's shape or color.  Also tell your health care provider if you have a mole that is larger than the size of a pencil eraser.  Always use sunscreen. Apply sunscreen liberally and repeatedly throughout the day.  Protect yourself by wearing long sleeves, pants, a wide-brimmed hat, and sunglasses whenever you are outside. HEART DISEASE, DIABETES, AND HIGH BLOOD PRESSURE   Have your blood pressure checked at least every 1-2 years. High blood pressure causes heart disease and increases the risk of stroke.  If you are between 55 years and 79 years old, ask your health care provider if you should take aspirin to prevent strokes.  Have regular diabetes screenings. This involves taking a blood sample to check your fasting  blood sugar level.  If you are at a normal weight and have a low risk for diabetes, have this test once every three years after 67 years of age.  If you are overweight and have a high risk for diabetes, consider being tested at a younger age or more often. PREVENTING INFECTION  Hepatitis B  If you have a higher risk for hepatitis B, you should be screened for this virus. You are considered at high risk for hepatitis B if:  You were born in a country where hepatitis B is common. Ask your health care provider which countries are considered high risk.  Your parents were born in a high-risk country, and you have not been immunized against hepatitis B (hepatitis B vaccine).  You have HIV or AIDS.  You use needles to inject street drugs.  You live with someone who has hepatitis B.  You have had sex with someone who has hepatitis B.  You get hemodialysis treatment.  You take certain medicines for conditions, including cancer, organ transplantation, and autoimmune conditions. Hepatitis C  Blood testing is recommended for:  Everyone born from 1945 through 1965.  Anyone with known risk factors for hepatitis C. Sexually transmitted infections (STIs)  You should be screened for sexually transmitted infections (STIs) including gonorrhea and chlamydia if:  You are sexually active and are younger than 67 years of age.  You are older than 67 years of age and your health care provider tells you that you are at risk for this type of infection.  Your sexual activity has changed since you were last screened and you are at an increased risk for chlamydia or gonorrhea. Ask your health care provider if you are at risk.  If you do not have HIV, but are at risk, it may be recommended that you take a prescription medicine daily to prevent HIV infection. This is called pre-exposure prophylaxis (PrEP). You are considered at risk if:  You are sexually active and do not regularly use condoms or know  the HIV status of your partner(s).  You take drugs by injection.  You are sexually active with a partner who has HIV. Talk with your health care provider about whether you are at high risk of being infected with HIV. If you choose to begin PrEP, you should first be tested for HIV. You should then be   tested every 3 months for as long as you are taking PrEP.  PREGNANCY   If you are premenopausal and you may become pregnant, ask your health care provider about preconception counseling.  If you may become pregnant, take 400 to 800 micrograms (mcg) of folic acid every day.  If you want to prevent pregnancy, talk to your health care provider about birth control (contraception). OSTEOPOROSIS AND MENOPAUSE   Osteoporosis is a disease in which the bones lose minerals and strength with aging. This can result in serious bone fractures. Your risk for osteoporosis can be identified using a bone density scan.  If you are 42 years of age or older, or if you are at risk for osteoporosis and fractures, ask your health care provider if you should be screened.  Ask your health care provider whether you should take a calcium or vitamin D supplement to lower your risk for osteoporosis.  Menopause may have certain physical symptoms and risks.  Hormone replacement therapy may reduce some of these symptoms and risks. Talk to your health care provider about whether hormone replacement therapy is right for you.  HOME CARE INSTRUCTIONS   Schedule regular health, dental, and eye exams.  Stay current with your immunizations.   Do not use any tobacco products including cigarettes, chewing tobacco, or electronic cigarettes.  If you are pregnant, do not drink alcohol.  If you are breastfeeding, limit how much and how often you drink alcohol.  Limit alcohol intake to no more than 1 drink per day for nonpregnant women. One drink equals 12 ounces of beer, 5 ounces of wine, or 1 ounces of hard liquor.  Do not  use street drugs.  Do not share needles.  Ask your health care provider for help if you need support or information about quitting drugs.  Tell your health care provider if you often feel depressed.  Tell your health care provider if you have ever been abused or do not feel safe at home. Document Released: 10/21/2010 Document Revised: 08/22/2013 Document Reviewed: 03/09/2013 [facility] Patient Information 2015 [place], [place]. This information is not intended to replace advice given to you by your health care provider. Make sure you discuss any questions you have with your health care provider. Cardiac Diet This diet can help prevent heart disease and stroke. Many factors influence your heart health, including eating and exercise habits. Coronary risk rises a lot with abnormal blood fat (lipid) levels. Cardiac meal planning includes limiting unhealthy fats, increasing healthy fats, and making other small dietary changes. General guidelines are as follows:  Adjust calorie intake to reach and maintain desirable body weight.  Limit total fat intake to less than 30% of total calories. Saturated fat should be less than 7% of calories.  Saturated fats are found in animal products and in some vegetable products. Saturated vegetable fats are found in coconut oil, cocoa butter, palm oil, and palm kernel oil. Read labels carefully to avoid these products as much as possible. Use butter in moderation. Choose tub margarines and oils that have 2 grams of fat or less. Good cooking oils are canola and olive oils.  Practice low-fat cooking techniques. Do not fry food. Instead, broil, bake, boil, steam, grill, roast on a rack, stir-fry, or microwave it. Other fat reducing suggestions include:  Remove the skin from poultry.  Remove all visible fat from meats.  Skim the fat off stews, soups, and gravies before serving them.  Steam vegetables in water or broth instead of sauting them  in fat.  Avoid foods  with trans fat (or hydrogenated oils), such as commercially fried foods and commercially baked goods. Commercial shortening and deep-frying fats will contain trans fat.  Increase intake of fruits, vegetables, whole grains, and legumes to replace foods high in fat.  Increase consumption of nuts, legumes, and seeds to at least 4 servings weekly. One serving of a legume equals  cup, and 1 serving of nuts or seeds equals  cup.  Choose whole grains more often. Have 3 servings per day (a serving is 1 ounce [oz]).  Eat 4 to 5 servings of vegetables per day. A serving of vegetables is 1 cup of raw leafy vegetables;  cup of raw or cooked cut-up vegetables;  cup of vegetable juice.  Eat 4 to 5 servings of fruit per day. A serving of fruit is 1 medium whole fruit;  cup of dried fruit;  cup of fresh, frozen, or canned fruit;  cup of 100% fruit juice.  Increase your intake of dietary fiber to 20 to 30 grams per day. Insoluble fiber may help lower your risk of heart disease and may help curb your appetite.  Soluble fiber binds cholesterol to be removed from the blood. Foods high in soluble fiber are dried beans, citrus fruits, oats, apples, bananas, broccoli, Brussels sprouts, and eggplant.  Try to include foods fortified with plant sterols or stanols, such as yogurt, breads, juices, or margarines. Choose several fortified foods to achieve a daily intake of 2 to 3 grams of plant sterols or stanols.  Foods with omega-3 fats can help reduce your risk of heart disease. Aim to have a 3.5 oz portion of fatty fish twice per week, such as salmon, mackerel, albacore tuna, sardines, lake trout, or herring. If you wish to take a fish oil supplement, choose one that contains 1 gram of both DHA and EPA.  Limit processed meats to 2 servings (3 oz portion) weekly.  Limit the sodium in your diet to 1500 milligrams (mg) per day. If you have high blood pressure, talk to a registered dietitian about a DASH (Dietary  Approaches to Stop Hypertension) eating plan.  Limit sweets and beverages with added sugar, such as soda, to no more than 5 servings per week. One serving is:   1 tablespoon sugar.  1 tablespoon jelly or jam.   cup sorbet.  1 cup lemonade.   cup regular soda. CHOOSING FOODS Starches  Allowed: Breads: All kinds (wheat, rye, raisin, white, oatmeal, [place], [place], and English muffin bread). Low-fat rolls: English muffins, frankfurter and hamburger buns, bagels, pita bread, tortillas (not fried). Pancakes, waffles, biscuits, and muffins made with recommended oil.  Avoid: Products made with saturated or trans fats, oils, or whole milk products. Butter rolls, cheese breads, croissants. Commercial doughnuts, muffins, sweet rolls, biscuits, waffles, pancakes, store-bought mixes. Crackers  Allowed: Low-fat crackers and snacks: Animal, graham, rye, saltine (with recommended oil, no lard), oyster, and matzo crackers. Bread sticks, melba toast, rusks, flatbread, pretzels, and light popcorn.  Avoid: High-fat crackers: cheese crackers, butter crackers, and those made with coconut, palm oil, or trans fat (hydrogenated oils). Buttered popcorn. Cereals  Allowed: Hot or cold whole-grain cereals.  Avoid: Cereals containing coconut, hydrogenated vegetable fat, or animal fat. Potatoes / Pasta / Rice  Allowed: All kinds of potatoes, rice, and pasta (such as macaroni, spaghetti, and noodles).  Avoid: Pasta or rice prepared with cream sauce or high-fat cheese. Chow mein noodles, [place] fries. Vegetables  Allowed: All vegetables and vegetable juices.  Avoid: Fried vegetables. Vegetables in cream, butter, or high-fat cheese sauces. Limit coconut. Fruit in cream or custard. Protein  Allowed: Limit your intake of meat, seafood, and poultry to no more than 6 oz (cooked weight) per day. All lean, well-trimmed beef, veal, pork, and lamb. All chicken and [place] without skin. All fish and shellfish.  Wild game: wild duck, rabbit, pheasant, and venison. Egg whites or low-cholesterol egg substitutes may be used as desired. Meatless dishes: recipes with dried beans, peas, lentils, and tofu (soybean curd). Seeds and nuts: all seeds and most nuts.  Avoid: Prime grade and other heavily marbled and fatty meats, such as short ribs, spare ribs, rib eye roast or steak, frankfurters, sausage, bacon, and high-fat luncheon meats, mutton. Caviar. Commercially fried fish. Domestic duck, goose, venison sausage. Organ meats: liver, gizzard, heart, chitterlings, brains, kidney, sweetbreads. Dairy  Allowed: Low-fat cheeses: nonfat or low-fat cottage cheese (1% or 2% fat), cheeses made with part skim milk, such as mozzarella, farmers, string, or ricotta. (Cheeses should be labeled no more than 2 to 6 grams fat per oz.). Skim (or 1%) milk: liquid, powdered, or evaporated. Buttermilk made with low-fat milk. Drinks made with skim or low-fat milk or cocoa. Chocolate milk or cocoa made with skim or low-fat (1%) milk. Nonfat or low-fat yogurt.  Avoid: Whole milk cheeses, including colby, cheddar, [place], Monterey Jack, [place], [place], [place], American, Swiss, and blue. Creamed cottage cheese, cream cheese. Whole milk and whole milk products, including buttermilk or yogurt made from whole milk, drinks made from whole milk. Condensed milk, evaporated whole milk, and 2% milk. Soups and Combination Foods  Allowed: Low-fat low-sodium soups: broth, dehydrated soups, homemade broth, soups with the fat removed, homemade cream soups made with skim or low-fat milk. Low-fat spaghetti, lasagna, chili, and Spanish rice if low-fat ingredients and low-fat cooking techniques are used.  Avoid: Cream soups made with whole milk, cream, or high-fat cheese. All other soups. Desserts and Sweets  Allowed: Sherbet, fruit ices, gelatins, meringues, and angel food cake. Homemade desserts with recommended fats, oils, and milk products. Jam,  jelly, honey, marmalade, sugars, and syrups. Pure sugar candy, such as gum drops, hard candy, jelly beans, marshmallows, mints, and small amounts of dark chocolate.  Avoid: Commercially prepared cakes, pies, cookies, frosting, pudding, or mixes for these products. Desserts containing whole milk products, chocolate, coconut, lard, palm oil, or palm kernel oil. Ice cream or ice cream drinks. Candy that contains chocolate, coconut, butter, hydrogenated fat, or unknown ingredients. Buttered syrups. Fats and Oils  Allowed: Vegetable oils: safflower, sunflower, corn, soybean, cottonseed, sesame, canola, olive, or peanut. Non-hydrogenated margarines. Salad dressing or mayonnaise: homemade or commercial, made with a recommended oil. Low or nonfat salad dressing or mayonnaise.  Limit added fats and oils to 6 to 8 tsp per day (includes fats used in cooking, baking, salads, and spreads on bread). Remember to count the "hidden fats" in foods.  Avoid: Solid fats and shortenings: butter, lard, salt pork, bacon drippings. Gravy containing meat fat, shortening, or suet. Cocoa butter, coconut. Coconut oil, palm oil, palm kernel oil, or hydrogenated oils: these ingredients are often used in bakery products, nondairy creamers, whipped toppings, candy, and commercially fried foods. Read labels carefully. Salad dressings made of unknown oils, sour cream, or cheese, such as blue cheese and Roquefort. Cream, all kinds: half-and-half, light, heavy, or whipping. Sour cream or cream cheese (even if "light" or low-fat). Nondairy cream substitutes: coffee creamers and sour cream substitutes made with palm, palm kernel, hydrogenated  oils, or coconut oil. Beverages  Allowed: Coffee (regular or decaffeinated), tea. Diet carbonated beverages, mineral water. Alcohol: Check with your caregiver. Moderation is recommended.  Avoid: Whole milk, regular sodas, and juice drinks with added sugar. Condiments  Allowed: All seasonings and  condiments. Cocoa powder. "Cream" sauces made with recommended ingredients.  Avoid: Carob powder made with hydrogenated fats. SAMPLE MENU Breakfast   cup orange juice   cup oatmeal  1 slice toast  1 tsp margarine  1 cup skim milk Lunch  [place] sandwich with 2 oz [place], 2 slices bread  Lettuce and tomato slices  Fresh fruit  Carrot sticks  Coffee or tea Snack  Fresh fruit or low-fat crackers Dinner  3 oz lean ground beef  1 baked potato  1 tsp margarine   cup asparagus  Lettuce salad  1 tbs non-creamy dressing   cup peach slices  1 cup skim milk Document Released: 01/15/2008 Document Revised: 10/07/2011 Document Reviewed: 06/07/2013 [employer] Patient Information 2015 [place], [place]. This information is not intended to replace advice given to you by your health care provider. Make sure you discuss any questions you have with your health care provider.

## 2014-02-06 NOTE — Progress Notes (Signed)
Subjective:    Patient ID: Kristen Escobar, female    DOB: 1946/04/16, 68 y.o.   MRN: [id number]  HPI  Patient ID: Kristen Escobar, female   DOB: [birth year], 68 y.o.   MRN: [id number]  Subjective:    Patient ID: Kristen Escobar, female    DOB: [birth year], 67 y.o.   MRN: [id number]  HPI  68 year-old patient who is seen today for a preventive health examination.  She has a history of right breast cancer and is followed closely by oncology. She has significant osteoarthritis and is status post bilateral total hip replacement surgery in the fall  Of 2013. She's had a recent eye examination 2 months ago She has had a recent bone scan and has had a colonoscopy in 2008.  She has had a recent oncology followup exam.  Past Medical History  Diagnosis Date  . Hypertension   . Obesity   . Cancer 03/2009    breast- rt  . Peripheral vascular disease 1995    PT DEVELOPED CIRCULATION PROBLEMS IN BOTH HANDS AND GANGRENE OF BOTH INDEX FINGERS--REQUIRING AMPUTATION OF THE INDEX FINGERS AND VASCULAR SURGERY.  PT TOLD HER PROBLEMS RELATED TO SMOKING.   NO OTHER PROBLEMS SINCE.  Marland Kitchen Arthritis   . History of radiation therapy 07/12/10,completed    right breast 60 Gy x30 fx    History   Social History  . Marital Status: Married    Spouse Name: N/A    Number of Children: N/A  . Years of Education: N/A   Occupational History  . Not on file.   Social History Main Topics  . Smoking status: Former Smoker -- 1.00 packs/day for 20 years    Quit date: 04/21/1993  . Smokeless tobacco: Never Used  . Alcohol Use: Yes     Comment: OCCAS - MAYBE ONCE A MONTH  . Drug Use: No  . Sexual Activity: Yes   Other Topics Concern  . Not on file   Social History Narrative  . No narrative on file    Past Surgical History  Procedure Laterality Date  . Tubal ligation    . Amputation      partial amputation of both index fingers  . Vascular surgery      both hands  . Total hip arthroplasty  12/16/2011    right hip    . Breast surgery  2011    lumpectomy with node sampling- RIGHT  . Total hip arthroplasty  01/20/2012    Procedure: TOTAL HIP ARTHROPLASTY ANTERIOR APPROACH;  Surgeon: Mauri Pole, MD;  Location: [facility];  Service: Orthopedics;  Laterality: Left;    Family History  Problem Relation Age of Onset  . Cancer Mother     pt unaware of what kind  . Hearing loss Mother   . Coronary artery disease Father   . Diabetes Father   . Diabetes Sister   . Cancer Sister     breast  . Coronary artery disease Brother   . Coronary artery disease Brother     No Known Allergies  Current Outpatient Prescriptions on File Prior to Visit  Medication Sig Dispense Refill  . ibuprofen (ADVIL,MOTRIN) 200 MG tablet Take 200 mg by mouth every 6 (six) hours as needed (as needed).      . losartan-hydrochlorothiazide (HYZAAR) 100-25 MG per tablet TAKE 1 TABLET BY MOUTH EVERY DAY  90 tablet  1  . tamoxifen (NOLVADEX) 20 MG tablet Take 1 tablet (20 mg total) by mouth daily.  [place]  tablet  6   No current facility-administered medications on file prior to visit.    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 Risk factors, based on past  M,S,F history-  cardiovascular risk factors include hypertension.  She has a history of peripheral vascular disease.  Prior history of tobacco use  2.  Physical activities: Fairly active physically although limited by right hip osteoarthritis does swim in her outdoor pool daily  3.  Depression/mood: No history depression or mood disorder 4.  Hearing: No deficits  5.  ADL's: Independent in all aspects of daily living  6.  Fall risk: Moderate due to her weight and hip arthritis  7.  Home safety: No problems identified  8.  Height weight, and visual acuity; height and weight stable no change in visual acuity  9.  Counseling: Weight loss encouraged heart healthy diet recommended. Low-salt diet recommended  10. Lab orders based on risk factors: Will check a TSH and lipid profile  11. Referral : Followup orthopedic and oncology 12. Care plan: Heart healthy diet weight loss encouraged  13. Cognitive assessment: Alert and oriented with normal affect. No cognitive dysfunction.  14.  Preventive services include annual health assessment.  She will have at least annual oncology followup and mammograms.  Annual eye examination with ophthalmology.  Patient was provided with a written and personalized care plan.  Immunizations updated  15.  Provider list includes oncology primary care radiology ophthalmology and gynecology       Review of Systems  Constitutional: Negative for fever, appetite change, fatigue and unexpected weight change.  HENT: Negative for hearing loss, ear pain, nosebleeds, congestion, sore throat, mouth sores, trouble swallowing, neck stiffness, dental problem, voice change, sinus pressure and tinnitus.   Eyes: Negative for photophobia, pain, redness and visual disturbance.  Respiratory: Negative for cough, chest tightness and shortness of breath.   Cardiovascular: Negative for chest pain, palpitations and leg swelling.    Gastrointestinal: Negative for nausea, vomiting, abdominal pain, diarrhea, constipation, blood in stool, abdominal distention and rectal pain.  Genitourinary: Negative for dysuria, urgency, frequency, hematuria, flank pain, vaginal bleeding, vaginal discharge, difficulty urinating, genital sores, vaginal pain, menstrual problem and pelvic pain.  Musculoskeletal: Positive for gait problem. Negative for back pain and arthralgias.  Skin: Negative for rash.  Neurological: Negative for dizziness, syncope, speech difficulty, weakness, light-headedness, numbness and headaches.  Hematological: Negative for adenopathy. Does not bruise/bleed easily.  Psychiatric/Behavioral: Negative for suicidal ideas, behavioral problems, self-injury, dysphoric mood and agitation. The patient is not nervous/anxious.  Objective:   Physical Exam  Constitutional: She is oriented to person, place, and time. She appears well-developed and well-nourished.  Obese. Weight 249 Blood pressure 140/80  HENT:  Head: Normocephalic and atraumatic.  Right Ear: External ear normal.  Left Ear: External ear normal.  Mouth/Throat: Oropharynx is clear and moist.  Eyes: Conjunctivae and EOM are normal.  Neck: Normal range of motion. Neck supple. No JVD present. No thyromegaly present.  Cardiovascular: Normal rate, regular rhythm, normal heart sounds and intact distal pulses.   No murmur heard. Pulmonary/Chest: Effort normal and breath sounds normal. She has no wheezes. She has no rales.  Abdominal: Soft. Bowel sounds are normal. She exhibits no distension and no mass. There is no tenderness. There is no rebound and no guarding.  Genitourinary: Vagina normal.  Musculoskeletal: Normal range of motion. She exhibits no edema and no tenderness.  Some  missing partial digits of the hands  Neurological: She is alert and oriented to person, place, and time. She has normal reflexes. No cranial nerve deficit. She exhibits normal muscle  tone. Coordination normal.  Skin: Skin is warm and dry. No rash noted.  Psychiatric: She has a normal mood and affect. Her behavior is normal.          Assessment & Plan:    Preventive health Hypertension Breast cancer Status post bilateral total hip replacement  Weight loss encouraged Recheck in 6 months or as needed Medications renewed  Review of Systems    see above Objective:   Physical Exam  Constitutional:  Weight 248 Blood pressure 140/70  Cardiovascular:  Murmur heard. Grade 2 over 6 brief systolic ejection murmur          Assessment & Plan:   Preventive health examination Exogenous obesity History right breast cancer Hypertension controlled  Weight loss encouraged Heart healthy diet recommended Recheck 6 months Gynecology followup recommended in

## 2014-02-07 ENCOUNTER — Telehealth: Payer: Self-pay | Admitting: Internal Medicine

## 2014-02-07 NOTE — Telephone Encounter (Signed)
emmi emailed °

## 2014-02-20 ENCOUNTER — Ambulatory Visit (INDEPENDENT_AMBULATORY_CARE_PROVIDER_SITE_OTHER): Payer: Medicare Other | Admitting: *Deleted

## 2014-02-20 DIAGNOSIS — Z23 Encounter for immunization: Secondary | ICD-10-CM

## 2014-04-05 DIAGNOSIS — M25562 Pain in left knee: Secondary | ICD-10-CM | POA: Diagnosis not present

## 2014-07-03 DIAGNOSIS — H2513 Age-related nuclear cataract, bilateral: Secondary | ICD-10-CM | POA: Diagnosis not present

## 2014-07-10 ENCOUNTER — Other Ambulatory Visit: Payer: Self-pay | Admitting: Internal Medicine

## 2014-07-24 ENCOUNTER — Ambulatory Visit (HOSPITAL_BASED_OUTPATIENT_CLINIC_OR_DEPARTMENT_OTHER): Payer: Medicare Other | Admitting: Hematology and Oncology

## 2014-07-24 ENCOUNTER — Telehealth: Payer: Self-pay | Admitting: Hematology and Oncology

## 2014-07-24 VITALS — BP 176/85 | HR 76 | Temp 98.1°F | Resp 18 | Ht 62.25 in | Wt 243.0 lb

## 2014-07-24 DIAGNOSIS — C50411 Malignant neoplasm of upper-outer quadrant of right female breast: Secondary | ICD-10-CM

## 2014-07-24 DIAGNOSIS — Z170 Estrogen receptor positive status [ER+]: Secondary | ICD-10-CM

## 2014-07-24 MED ORDER — TAMOXIFEN CITRATE 20 MG PO TABS: 20.0000 mg | ORAL_TABLET | Freq: Every day | ORAL | Status: DC

## 2014-07-24 NOTE — Progress Notes (Signed)
Patient Care Team: Marletta Lor, MD as PCP - General Consuela Mimes, MD as Consulting Physician (Internal Medicine) Thea Silversmith, MD as Consulting Physician (Radiation Oncology)  DIAGNOSIS: Breast cancer of upper-outer quadrant of right female breast   Staging form: Breast, AJCC 7th Edition     Clinical: No stage assigned - Unsigned     Pathologic: Stage IA (T1a, N0, cM0) - Signed by Stark Shimp, MD on 03/21/2011   SUMMARY OF ONCOLOGIC HISTORY:   Breast cancer of upper-outer quadrant of right female breast   05/23/2009 - 03/27/2010 Anti-estrogen oral therapy Neoadjuvant antiestrogen therapy with tamoxifen for 10 months   03/28/2010 Surgery 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 Radiation recall related to tamoxifen   11/25/2010 -  Anti-estrogen oral therapy Aromasin 25 mg daily.myalgias and arthralgias stop November 2012 went back to tamoxifen 20 mg daily    CHIEF COMPLIANT: Follow-up on tamoxifen  INTERVAL HISTORY: Kristen Escobar is a 69 year old with above-mentioned history of right breast cancer treated with lumpectomy and radiation is currently on antiestrogen therapy with tamoxifen 20 mg daily. She is tolerating it fairly well without any major problems or concerns. She hasn't had any further problems with radiation recall. Her blood pressure is elevated today and she reports that every time she spends to the physician's office it is elevated. I discussed with her that she needs to see her primary care physician and discuss her blood pressure issue.  REVIEW OF SYSTEMS:   Constitutional: Denies fevers, chills or abnormal weight loss Eyes: Denies blurriness of vision Ears, nose, mouth, throat, and face: Denies mucositis or sore throat Respiratory: Denies cough, dyspnea or  wheezes Cardiovascular: Denies palpitation, chest discomfort or lower extremity swelling Gastrointestinal:  Denies nausea, heartburn or change in bowel habits Skin: Denies abnormal skin rashes Lymphatics: Denies new lymphadenopathy or easy bruising Neurological:Denies numbness, tingling or new weaknesses Behavioral/Psych: Mood is stable, no new changes  Breast:  denies any pain or lumps or nodules in either breasts All other systems were reviewed with the patient and are negative.  I have reviewed the past medical history, past surgical history, social history and family history with the patient and they are unchanged from previous note.  ALLERGIES:  has No Known Allergies.  MEDICATIONS:  Current Outpatient Prescriptions  Medication Sig Dispense Refill  . ibuprofen (ADVIL,MOTRIN) 200 MG tablet Take 200 mg by mouth every 6 (six) hours as needed (as needed).    . losartan-hydrochlorothiazide (HYZAAR) 100-25 MG per tablet TAKE 1 TABLET BY MOUTH EVERY DAY 90 tablet 1  . tamoxifen (NOLVADEX) 20 MG tablet Take 1 tablet (20 mg total) by mouth daily. 90 tablet 6   No current facility-administered medications for this visit.    PHYSICAL EXAMINATION: ECOG PERFORMANCE STATUS: 0 - Asymptomatic  There were no vitals filed for this visit. There were no vitals filed for this visit.  GENERAL:alert, no distress and comfortable SKIN: skin color, texture, turgor are normal, no rashes or significant lesions EYES: normal, Conjunctiva are pink and non-injected, sclera clear OROPHARYNX:no exudate, no erythema and lips, buccal mucosa, and tongue normal  NECK: supple, thyroid normal size, non-tender, without nodularity LYMPH:  no palpable lymphadenopathy in the cervical, axillary or inguinal LUNGS: clear to auscultation and percussion with normal breathing effort HEART: regular rate & rhythm and no murmurs and no lower  extremity edema ABDOMEN:abdomen soft, non-tender and normal bowel  sounds Musculoskeletal:no cyanosis of digits and no clubbing  NEURO: alert & oriented x 3 with fluent speech, no focal motor/sensory deficits BREAST: No palpable masses or nodules in either right or left breasts. Chronic changes from prior radiation therapy on the right. No palpable axillary supraclavicular or infraclavicular adenopathy no breast tenderness or nipple discharge. (exam performed in the presence of a chaperone)  LABORATORY DATA:  I have reviewed the data as listed   Chemistry      Component Value Date/Time   NA 140 01/18/2014 1029   NA 135 01/22/2012 0410   NA 142 05/02/2009 1517   K 4.0 01/18/2014 1029   K 4.3 01/22/2012 0410   K 4.1 05/02/2009 1517   CL 105 03/10/2012 0859   CL 102 01/22/2012 0410   CL 100 05/02/2009 1517   CO2 27 01/18/2014 1029   CO2 24 01/22/2012 0410   CO2 29 05/02/2009 1517   BUN 15.8 01/18/2014 1029   BUN 30* 01/22/2012 0410   BUN 19 05/02/2009 1517   CREATININE 1.2* 01/18/2014 1029   CREATININE 1.39* 01/22/2012 0410   CREATININE 0.9 05/02/2009 1517      Component Value Date/Time   CALCIUM 9.1 01/18/2014 1029   CALCIUM 8.4 01/22/2012 0410   CALCIUM 9.0 05/02/2009 1517   ALKPHOS 50 01/18/2014 1029   ALKPHOS 48 09/11/2011 0825   ALKPHOS 65 05/02/2009 1517   AST 14 01/18/2014 1029   AST 13 09/11/2011 0825   AST 24 05/02/2009 1517   ALT 10 01/18/2014 1029   ALT 12 09/11/2011 0825   ALT 17 05/02/2009 1517   BILITOT 0.37 01/18/2014 1029   BILITOT 0.6 09/11/2011 0825   BILITOT 0.60 05/02/2009 1517       Lab Results  Component Value Date   WBC 8.5 01/18/2014   HGB 12.3 01/18/2014   HCT 37.9 01/18/2014   MCV 85.6 01/18/2014   PLT 218 01/18/2014   NEUTROABS 5.0 01/18/2014    ASSESSMENT & PLAN:  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   Tamoxifen  toxicities: She is tolerating tamoxifen extremely well without any major problems or concerns. She completed 5 years of therapy. I recommended continuation of tamoxifen for 10 years. Since the patient is generally asymptomatic, she will go on extended adjuvant therapy. She will make an appointment with a gynecologist to get Pap tests annually. This is required because she is on tamoxifen.  Radiation recall: She gets periodic radiation recall reactions of the breast. She has had a total of 4 episodes until now. Each episode lasts 1-2 days associated with redness and low-grade temperature and it goes away spontaneously. Previous biopsies with dermatology have been negative.  Surveillance:  1. Mammogram 12/19/2013 is normal  2. Breast exam 07/24/2014 is normal except for the tissue density associated with the site of prior surgery and radiation. She will have annual mammograms in August of the year and we will see her in 6 months for physical exam.  Survivorship: Patient was instructed that she needs to have annual GYN exams while on tamoxifen. I discussed the importance of exercise and eating more fruits and vegetables and less red meat.  No orders of the defined types were placed in this encounter.   The patient has a good understanding of the overall plan. she agrees  with it. She will call with any problems that may develop before her next visit here.   Rulon Eisenmenger, MD

## 2014-07-24 NOTE — Telephone Encounter (Signed)
per pof to sch pt appt-gave pt copy of sch °

## 2014-07-24 NOTE — Assessment & Plan Note (Signed)
Right breast invasive lobular cancer T1, N0, M0 stage IA status post neoadjuvant tamoxifen followed by lumpectomy and radiation followed by tamoxifen therapy.   Tamoxifen toxicities: She is tolerating tamoxifen extremely well without any major problems or concerns.  Radiation recall: She gets periodic radiation recall reactions of the breast. She has had a total of 4 episodes until now. Each episode lasts 1-2 days associated with redness and low-grade temperature and it goes away spontaneously. Previous biopsies with dermatology have been negative.  Surveillance:  1. Mammogram 12/19/2013 is normal  2. Breast exam 07/24/2014 is normal except for the tissue density associated with the site of prior surgery and radiation. She will have annual mammograms in August of the year and we will see her in 6 months for physical exam.  Survivorship: Patient was instructed that she needs to have annual GYN exams while on tamoxifen. I discussed the importance of exercise and eating more fruits and vegetables and less red meat.

## 2014-08-08 ENCOUNTER — Ambulatory Visit: Payer: Medicare Other | Admitting: Internal Medicine

## 2014-08-15 ENCOUNTER — Ambulatory Visit: Payer: Medicare Other | Admitting: Internal Medicine

## 2014-08-17 DIAGNOSIS — L565 Disseminated superficial actinic porokeratosis (DSAP): Secondary | ICD-10-CM | POA: Diagnosis not present

## 2014-08-21 ENCOUNTER — Encounter: Payer: Self-pay | Admitting: Internal Medicine

## 2014-08-21 ENCOUNTER — Ambulatory Visit (INDEPENDENT_AMBULATORY_CARE_PROVIDER_SITE_OTHER): Payer: Medicare Other | Admitting: Internal Medicine

## 2014-08-21 VITALS — BP 120/60 | HR 79 | Temp 98.9°F | Resp 20 | Ht 62.25 in | Wt 242.0 lb

## 2014-08-21 DIAGNOSIS — M150 Primary generalized (osteo)arthritis: Secondary | ICD-10-CM

## 2014-08-21 DIAGNOSIS — M159 Polyosteoarthritis, unspecified: Secondary | ICD-10-CM

## 2014-08-21 DIAGNOSIS — I10 Essential (primary) hypertension: Secondary | ICD-10-CM | POA: Diagnosis not present

## 2014-08-21 NOTE — Patient Instructions (Signed)

## 2014-08-21 NOTE — Progress Notes (Signed)
Pre visit review using our clinic review tool, if applicable. No additional management support is needed unless otherwise documented below in the visit note. 

## 2014-08-21 NOTE — Progress Notes (Signed)
Subjective:    Patient ID: Kristen Escobar, female    DOB: [birth year], 69 y.o.   MRN: [id number]  HPI  69 year old patient who has treated hypertension.  She has done quite well without concerns or complaints.  She is seen for her six-month follow-up.  Past Medical History  Diagnosis Date  . Hypertension   . Obesity   . Cancer 03/2009    breast- rt  . Peripheral vascular disease 1995    PT DEVELOPED CIRCULATION PROBLEMS IN BOTH HANDS AND GANGRENE OF BOTH INDEX FINGERS--REQUIRING AMPUTATION OF THE INDEX FINGERS AND VASCULAR SURGERY.  PT TOLD HER PROBLEMS RELATED TO SMOKING.   NO OTHER PROBLEMS SINCE.  Marland Kitchen Arthritis   . History of radiation therapy 07/12/10,completed    right breast 60 Gy x30 fx    History   Social History  . Marital Status: Married    Spouse Name: N/A  . Number of Children: N/A  . Years of Education: N/A   Occupational History  . Not on file.   Social History Main Topics  . Smoking status: Former Smoker -- 1.00 packs/day for 20 years    Quit date: 04/21/1993  . Smokeless tobacco: Never Used  . Alcohol Use: Yes     Comment: OCCAS - MAYBE ONCE A MONTH  . Drug Use: No  . Sexual Activity: Yes   Other Topics Concern  . Not on file   Social History Narrative    Past Surgical History  Procedure Laterality Date  . Tubal ligation    . Amputation      partial amputation of both index fingers  . Vascular surgery      both hands  . Total hip arthroplasty  12/16/2011    right hip  . Breast surgery  2011    lumpectomy with node sampling- RIGHT  . Total hip arthroplasty  01/20/2012    Procedure: TOTAL HIP ARTHROPLASTY ANTERIOR APPROACH;  Surgeon: Mauri Pole, MD;  Location: [facility];  Service: Orthopedics;  Laterality: Left;    Family History  Problem Relation Age of Onset  . Cancer Mother     pt unaware of what kind  . Hearing loss Mother   . Coronary artery disease Father   . Diabetes Father   . Diabetes Sister   . Cancer Sister     breast  .  Coronary artery disease Brother   . Coronary artery disease Brother     No Known Allergies  Current Outpatient Prescriptions on File Prior to Visit  Medication Sig Dispense Refill  . ibuprofen (ADVIL,MOTRIN) 200 MG tablet Take 200 mg by mouth every 6 (six) hours as needed (as needed).    . losartan-hydrochlorothiazide (HYZAAR) 100-25 MG per tablet TAKE 1 TABLET BY MOUTH EVERY DAY 90 tablet 1  . tamoxifen (NOLVADEX) 20 MG tablet Take 1 tablet (20 mg total) by mouth daily. 90 tablet 3   No current facility-administered medications on file prior to visit.    BP 120/60 mmHg  Pulse 79  Temp(Src) 98.9 F (37.2 C) (Oral)  Resp 20  Ht 5' 2.25" (1.581 m)  Wt 242 lb (109.77 kg)  BMI 43.92 kg/m2  SpO2 97%     Review of Systems  Constitutional: Negative.   HENT: Negative for congestion, dental problem, hearing loss, rhinorrhea, sinus pressure, sore throat and tinnitus.   Eyes: Negative for pain, discharge and visual disturbance.  Respiratory: Negative for cough and shortness of breath.   Cardiovascular: Negative for chest pain, palpitations and  leg swelling.  Gastrointestinal: Negative for nausea, vomiting, abdominal pain, diarrhea, constipation, blood in stool and abdominal distention.  Genitourinary: Negative for dysuria, urgency, frequency, hematuria, flank pain, vaginal bleeding, vaginal discharge, difficulty urinating, vaginal pain and pelvic pain.  Musculoskeletal: Negative for joint swelling, arthralgias and gait problem.  Skin: Negative for rash.  Neurological: Negative for dizziness, syncope, speech difficulty, weakness, numbness and headaches.  Hematological: Negative for adenopathy.  Psychiatric/Behavioral: Negative for behavioral problems, dysphoric mood and agitation. The patient is not nervous/anxious.        Objective:   Physical Exam  Constitutional: She is oriented to person, place, and time. She appears well-developed and well-nourished.  HENT:  Head:  Normocephalic.  Right Ear: External ear normal.  Left Ear: External ear normal.  Mouth/Throat: Oropharynx is clear and moist.  Eyes: Conjunctivae and EOM are normal. Pupils are equal, round, and reactive to light.  Neck: Normal range of motion. Neck supple. No thyromegaly present.  Bilateral bruits  Cardiovascular: Normal rate, regular rhythm, normal heart sounds and intact distal pulses.   Pulmonary/Chest: Effort normal and breath sounds normal.  Abdominal: Soft. Bowel sounds are normal. She exhibits no mass. There is no tenderness.  Musculoskeletal: Normal range of motion.  Lymphadenopathy:    She has no cervical adenopathy.  Neurological: She is alert and oriented to person, place, and time.  Skin: Skin is warm and dry. No rash noted.  Psychiatric: She has a normal mood and affect. Her behavior is normal.          Assessment & Plan:   Hypertension, well-controlled Osteoarthritis, stable Obesity  Recheck in 6 months We'll check carotid artery Doppler studies at that time

## 2014-11-24 DIAGNOSIS — Z853 Personal history of malignant neoplasm of breast: Secondary | ICD-10-CM | POA: Diagnosis not present

## 2014-11-24 DIAGNOSIS — Z124 Encounter for screening for malignant neoplasm of cervix: Secondary | ICD-10-CM | POA: Diagnosis not present

## 2014-12-19 ENCOUNTER — Other Ambulatory Visit: Payer: Self-pay | Admitting: Internal Medicine

## 2014-12-19 DIAGNOSIS — Z853 Personal history of malignant neoplasm of breast: Secondary | ICD-10-CM

## 2014-12-22 ENCOUNTER — Other Ambulatory Visit: Payer: Self-pay

## 2014-12-22 ENCOUNTER — Other Ambulatory Visit: Payer: Self-pay | Admitting: Internal Medicine

## 2014-12-22 DIAGNOSIS — Z853 Personal history of malignant neoplasm of breast: Secondary | ICD-10-CM

## 2014-12-27 ENCOUNTER — Ambulatory Visit
Admission: RE | Admit: 2014-12-27 | Discharge: 2014-12-27 | Disposition: A | Discharge status: Home / Self Care | Payer: Medicare Other | Source: Ambulatory Visit | Attending: Internal Medicine | Admitting: Internal Medicine

## 2014-12-27 DIAGNOSIS — Z853 Personal history of malignant neoplasm of breast: Secondary | ICD-10-CM

## 2014-12-27 DIAGNOSIS — R928 Other abnormal and inconclusive findings on diagnostic imaging of breast: Secondary | ICD-10-CM | POA: Diagnosis not present

## 2015-01-01 DIAGNOSIS — D2272 Melanocytic nevi of left lower limb, including hip: Secondary | ICD-10-CM | POA: Diagnosis not present

## 2015-01-01 DIAGNOSIS — D225 Melanocytic nevi of trunk: Secondary | ICD-10-CM | POA: Diagnosis not present

## 2015-01-01 DIAGNOSIS — L814 Other melanin hyperpigmentation: Secondary | ICD-10-CM | POA: Diagnosis not present

## 2015-01-01 DIAGNOSIS — D1801 Hemangioma of skin and subcutaneous tissue: Secondary | ICD-10-CM | POA: Diagnosis not present

## 2015-01-01 DIAGNOSIS — D2271 Melanocytic nevi of right lower limb, including hip: Secondary | ICD-10-CM | POA: Diagnosis not present

## 2015-01-01 DIAGNOSIS — L821 Other seborrheic keratosis: Secondary | ICD-10-CM | POA: Diagnosis not present

## 2015-01-01 DIAGNOSIS — L565 Disseminated superficial actinic porokeratosis (DSAP): Secondary | ICD-10-CM | POA: Diagnosis not present

## 2015-01-01 DIAGNOSIS — L718 Other rosacea: Secondary | ICD-10-CM | POA: Diagnosis not present

## 2015-01-01 DIAGNOSIS — D2371 Other benign neoplasm of skin of right lower limb, including hip: Secondary | ICD-10-CM | POA: Diagnosis not present

## 2015-01-09 ENCOUNTER — Other Ambulatory Visit: Payer: Self-pay | Admitting: Internal Medicine

## 2015-01-10 DIAGNOSIS — S335XXA Sprain of ligaments of lumbar spine, initial encounter: Secondary | ICD-10-CM | POA: Diagnosis not present

## 2015-01-10 DIAGNOSIS — M5136 Other intervertebral disc degeneration, lumbar region: Secondary | ICD-10-CM | POA: Diagnosis not present

## 2015-02-21 ENCOUNTER — Ambulatory Visit (INDEPENDENT_AMBULATORY_CARE_PROVIDER_SITE_OTHER): Payer: Medicare Other | Admitting: Internal Medicine

## 2015-02-21 ENCOUNTER — Encounter: Payer: Self-pay | Admitting: Internal Medicine

## 2015-02-21 DIAGNOSIS — I10 Essential (primary) hypertension: Secondary | ICD-10-CM | POA: Diagnosis not present

## 2015-02-21 DIAGNOSIS — Z0000 Encounter for general adult medical examination without abnormal findings: Secondary | ICD-10-CM

## 2015-02-21 DIAGNOSIS — M159 Polyosteoarthritis, unspecified: Secondary | ICD-10-CM

## 2015-02-21 DIAGNOSIS — M150 Primary generalized (osteo)arthritis: Secondary | ICD-10-CM

## 2015-02-21 DIAGNOSIS — Z23 Encounter for immunization: Secondary | ICD-10-CM | POA: Diagnosis not present

## 2015-02-21 DIAGNOSIS — E785 Hyperlipidemia, unspecified: Secondary | ICD-10-CM | POA: Diagnosis not present

## 2015-02-21 LAB — LIPID PANEL
Cholesterol: 202 mg/dL — ABNORMAL HIGH (ref 0–200)
HDL: 48 mg/dL (ref >39.00)
LDL Cholesterol: 128 mg/dL — ABNORMAL HIGH (ref 0–99)
NONHDL: 153.67
Total CHOL/HDL Ratio: 4
Triglycerides: 126 mg/dL (ref 0.0–149.0)
VLDL: 25.2 mg/dL (ref 0.0–40.0)

## 2015-02-21 LAB — CBC WITH DIFFERENTIAL/PLATELET
BASOS ABS: 0 10*3/uL (ref 0.0–0.1)
Basophils Relative: 0.5 % (ref 0.0–3.0)
EOS ABS: 0.3 10*3/uL (ref 0.0–0.7)
Eosinophils Relative: 4 % (ref 0.0–5.0)
HCT: 42.5 % (ref 36.0–46.0)
Hemoglobin: 13.6 g/dL (ref 12.0–15.0)
LYMPHS ABS: 2.1 10*3/uL (ref 0.7–4.0)
Lymphocytes Relative: 25.7 % (ref 12.0–46.0)
MCHC: 31.9 g/dL (ref 30.0–36.0)
MCV: 87.4 fl (ref 78.0–100.0)
MONO ABS: 0.7 10*3/uL (ref 0.1–1.0)
Monocytes Relative: 9.2 % (ref 3.0–12.0)
NEUTROS ABS: 4.9 10*3/uL (ref 1.4–7.7)
NEUTROS PCT: 60.6 % (ref 43.0–77.0)
PLATELETS: 283 10*3/uL (ref 150.0–400.0)
RBC: 4.87 Mil/uL (ref 3.87–5.11)
RDW: 14.8 % (ref 11.5–15.5)
WBC: 8 10*3/uL (ref 4.0–10.5)

## 2015-02-21 LAB — COMPREHENSIVE METABOLIC PANEL
ALK PHOS: 49 U/L (ref 39–117)
ALT: 14 U/L (ref 0–35)
AST: 15 U/L (ref 0–37)
Albumin: 3.7 g/dL (ref 3.5–5.2)
BILIRUBIN TOTAL: 0.6 mg/dL (ref 0.2–1.2)
BUN: 22 mg/dL (ref 6–23)
CO2: 30 meq/L (ref 19–32)
CREATININE: 1.29 mg/dL — AB (ref 0.40–1.20)
Calcium: 9.8 mg/dL (ref 8.4–10.5)
Chloride: 104 mEq/L (ref 96–112)
GFR: 43.62 mL/min — ABNORMAL LOW (ref >60.00)
GLUCOSE: 118 mg/dL — AB (ref 70–99)
Potassium: 4.9 mEq/L (ref 3.5–5.1)
SODIUM: 143 meq/L (ref 135–145)
TOTAL PROTEIN: 7 g/dL (ref 6.0–8.3)

## 2015-02-21 LAB — TSH: TSH: 5.09 u[IU]/mL — AB (ref 0.35–4.50)

## 2015-02-21 MED ORDER — ZOSTER VACCINE LIVE 19400 UNT/0.65ML ~~LOC~~ SOLR: 0.6500 mL | Freq: Once | SUBCUTANEOUS | Status: DC

## 2015-02-21 NOTE — Patient Instructions (Signed)
Limit your sodium (Salt) intake    It is important that you exercise regularly, at least 20 minutes 3 to 4 times per week.  If you develop chest pain or shortness of breath seek  medical attention.  You need to lose weight.  Consider a lower calorie diet and regular exercise.  Return in 6 months for follow-up  Menopause is a normal process in which your reproductive ability comes to an end. This process happens gradually over a span of months to years, usually between the ages of 48 and 55. Menopause is complete when you have missed 12 consecutive menstrual periods. It is important to talk with your health care provider about some of the most common conditions that affect postmenopausal women, such as heart disease, cancer, and bone loss (osteoporosis). Adopting a healthy lifestyle and getting preventive care can help to promote your health and wellness. Those actions can also lower your chances of developing some of these common conditions. WHAT SHOULD I KNOW ABOUT MENOPAUSE? During menopause, you may experience a number of symptoms, such as:  Moderate-to-severe hot flashes.  Night sweats.  Decrease in sex drive.  Mood swings.  Headaches.  Tiredness.  Irritability.  Memory problems.  Insomnia. Choosing to treat or not to treat menopausal changes is an individual decision that you make with your health care provider. WHAT SHOULD I KNOW ABOUT HORMONE REPLACEMENT THERAPY AND SUPPLEMENTS? Hormone therapy products are effective for treating symptoms that are associated with menopause, such as hot flashes and night sweats. Hormone replacement carries certain risks, especially as you become older. If you are thinking about using estrogen or estrogen with progestin treatments, discuss the benefits and risks with your health care provider. WHAT SHOULD I KNOW ABOUT HEART DISEASE AND STROKE? Heart disease, heart attack, and stroke become more likely as you age. This may be due, in part, to  the hormonal changes that your body experiences during menopause. These can affect how your body processes dietary fats, triglycerides, and cholesterol. Heart attack and stroke are both medical emergencies. There are many things that you can do to help prevent heart disease and stroke:  Have your blood pressure checked at least every 1-2 years. High blood pressure causes heart disease and increases the risk of stroke.  If you are 55-79 years old, ask your health care provider if you should take aspirin to prevent a heart attack or a stroke.  Do not use any tobacco products, including cigarettes, chewing tobacco, or electronic cigarettes. If you need help quitting, ask your health care provider.  It is important to eat a healthy diet and maintain a healthy weight.  Be sure to include plenty of vegetables, fruits, low-fat dairy products, and lean protein.  Avoid eating foods that are high in solid fats, added sugars, or salt (sodium).  Get regular exercise. This is one of the most important things that you can do for your health.  Try to exercise for at least 150 minutes each week. The type of exercise that you do should increase your heart rate and make you sweat. This is known as moderate-intensity exercise.  Try to do strengthening exercises at least twice each week. Do these in addition to the moderate-intensity exercise.  Know your numbers.Ask your health care provider to check your cholesterol and your blood glucose. Continue to have your blood tested as directed by your health care provider. WHAT SHOULD I KNOW ABOUT CANCER SCREENING? There are several types of cancer. Take the following steps to reduce   your risk and to catch any cancer development as early as possible. Breast Cancer  Practice breast self-awareness.  This means understanding how your breasts normally appear and feel.  It also means doing regular breast self-exams. Let your health care provider know about any  changes, no matter how small.  If you are 70 or older, have a clinician do a breast exam (clinical breast exam or CBE) every year. Depending on your age, family history, and medical history, it may be recommended that you also have a yearly breast X-ray (mammogram).  If you have a family history of breast cancer, talk with your health care provider about genetic screening.  If you are at high risk for breast cancer, talk with your health care provider about having an MRI and a mammogram every year.  Breast cancer (BRCA) gene test is recommended for women who have family members with BRCA-related cancers. Results of the assessment will determine the need for genetic counseling and BRCA1 and for BRCA2 testing. BRCA-related cancers include these types:  Breast. This occurs in males or females.  Ovarian.  Tubal. This may also be called fallopian tube cancer.  Cancer of the abdominal or pelvic lining (peritoneal cancer).  Prostate.  Pancreatic. Cervical, Uterine, and Ovarian Cancer Your health care provider may recommend that you be screened regularly for cancer of the pelvic organs. These include your ovaries, uterus, and vagina. This screening involves a pelvic exam, which includes checking for microscopic changes to the surface of your cervix (Pap test).  For women ages 21-65, health care providers may recommend a pelvic exam and a Pap test every three years. For women ages 18-65, they may recommend the Pap test and pelvic exam, combined with testing for human papilloma virus (HPV), every five years. Some types of HPV increase your risk of cervical cancer. Testing for HPV may also be done on women of any age who have unclear Pap test results.  Other health care providers may not recommend any screening for nonpregnant women who are considered low risk for pelvic cancer and have no symptoms. Ask your health care provider if a screening pelvic exam is right for you.  If you have had past  treatment for cervical cancer or a condition that could lead to cancer, you need Pap tests and screening for cancer for at least 20 years after your treatment. If Pap tests have been discontinued for you, your risk factors (such as having a new sexual partner) need to be reassessed to determine if you should start having screenings again. Some women have medical problems that increase the chance of getting cervical cancer. In these cases, your health care provider may recommend that you have screening and Pap tests more often.  If you have a family history of uterine cancer or ovarian cancer, talk with your health care provider about genetic screening.  If you have vaginal bleeding after reaching menopause, tell your health care provider.  There are currently no reliable tests available to screen for ovarian cancer. Lung Cancer Lung cancer screening is recommended for adults 77-66 years old who are at high risk for lung cancer because of a history of smoking. A yearly low-dose CT scan of the lungs is recommended if you:  Currently smoke.  Have a history of at least 30 pack-years of smoking and you currently smoke or have quit within the past 15 years. A pack-year is smoking an average of one pack of cigarettes per day for one year. Yearly screening should:  Continue until it has been 15 years since you quit.  Stop if you develop a health problem that would prevent you from having lung cancer treatment. Colorectal Cancer  This type of cancer can be detected and can often be prevented.  Routine colorectal cancer screening usually begins at age 46 and continues through age 49.  If you have risk factors for colon cancer, your health care provider may recommend that you be screened at an earlier age.  If you have a family history of colorectal cancer, talk with your health care provider about genetic screening.  Your health care provider may also recommend using home test kits to check for  hidden blood in your stool.  A small camera at the end of a tube can be used to examine your colon directly (sigmoidoscopy or colonoscopy). This is done to check for the earliest forms of colorectal cancer.  Direct examination of the colon should be repeated every 5-10 years until age 6. However, if early forms of precancerous polyps or small growths are found or if you have a family history or genetic risk for colorectal cancer, you may need to be screened more often. Skin Cancer  Check your skin from head to toe regularly.  Monitor any moles. Be sure to tell your health care provider:  About any new moles or changes in moles, especially if there is a change in a mole's shape or color.  If you have a mole that is larger than the size of a pencil eraser.  If any of your family members has a history of skin cancer, especially at a young age, talk with your health care provider about genetic screening.  Always use sunscreen. Apply sunscreen liberally and repeatedly throughout the day.  Whenever you are outside, protect yourself by wearing long sleeves, pants, a wide-brimmed hat, and sunglasses. WHAT SHOULD I KNOW ABOUT OSTEOPOROSIS? Osteoporosis is a condition in which bone destruction happens more quickly than new bone creation. After menopause, you may be at an increased risk for osteoporosis. To help prevent osteoporosis or the bone fractures that can happen because of osteoporosis, the following is recommended:  If you are 91-8 years old, get at least 1,000 mg of calcium and at least 600 mg of vitamin D per day.  If you are older than age 53 but younger than age 69, get at least 1,200 mg of calcium and at least 600 mg of vitamin D per day.  If you are older than age 79, get at least 1,200 mg of calcium and at least 800 mg of vitamin D per day. Smoking and excessive alcohol intake increase the risk of osteoporosis. Eat foods that are rich in calcium and vitamin D, and do weight-bearing  exercises several times each week as directed by your health care provider. WHAT SHOULD I KNOW ABOUT HOW MENOPAUSE AFFECTS [place]? Depression may occur at any age, but it is more common as you become older. Common symptoms of depression include:  Low or sad mood.  Changes in sleep patterns.  Changes in appetite or eating patterns.  Feeling an overall lack of motivation or enjoyment of activities that you previously enjoyed.  Frequent crying spells. Talk with your health care provider if you think that you are experiencing depression. WHAT SHOULD I KNOW ABOUT IMMUNIZATIONS? It is important that you get and maintain your immunizations. These include:  Tetanus, diphtheria, and pertussis (Tdap) booster vaccine.  Influenza every year before the flu season begins.  Pneumonia  vaccine.  Shingles vaccine. Your health care provider may also recommend other immunizations.   This information is not intended to replace advice given to you by your health care provider. Make sure you discuss any questions you have with your health care provider.   Document Released: 05/30/2005 Document Revised: 04/28/2014 Document Reviewed: 12/08/2013 Elsevier Interactive Patient Education [employer].

## 2015-02-21 NOTE — Progress Notes (Signed)
Pre visit review using our clinic review tool, if applicable. No additional management support is needed unless otherwise documented below in the visit note. 

## 2015-02-21 NOTE — Progress Notes (Signed)
Subjective:    Patient ID: Kristen Escobar, female    DOB: 1945/04/22, 69 y.o.   MRN: [id number]  HPI   Patient ID: Kristen Escobar, female   DOB: Sep 27, 1945, 69 y.o.   MRN: [id number]  Subjective:    Patient ID: Kristen Escobar, female    DOB: [birth year], 69 y.o.   MRN: [id number]  HPI  69 year-old patient who is seen today for a preventive health examination.  She has a history of right breast cancer and is followed closely by oncology. She has significant osteoarthritis and is status post bilateral total hip replacement surgery in the fall  of 2013. She's has annual eye examinations.  She  has had a colonoscopy in 2008.  She has had a recent oncology followup exam.  Wt Readings from Last 3 Encounters:  02/21/15 233 lb (105.688 kg)  08/21/14 242 lb (109.77 kg)  07/24/14 243 lb (110.224 kg)    Past Medical History  Diagnosis Date  . Hypertension   . Obesity   . Cancer ([place]) 03/2009    breast- rt  . Peripheral vascular disease ([place]) 1995    PT DEVELOPED CIRCULATION PROBLEMS IN BOTH HANDS AND GANGRENE OF BOTH INDEX FINGERS--REQUIRING AMPUTATION OF THE INDEX FINGERS AND VASCULAR SURGERY.  PT TOLD HER PROBLEMS RELATED TO SMOKING.   NO OTHER PROBLEMS SINCE.  Mar[place] Kitchen Arthritis   . History of radiation therapy 07/12/10,completed    right breast 60 Gy x30 fx    Social History   Social History  . Marital Status: Married    Spouse Name: N/A  . Number of Children: N/A  . Years of Education: N/A   Occupational History  . Not on file.   Social History Main Topics  . Smoking status: Former Smoker -- 1.00 packs/day for 20 years    Quit date: 04/21/1993  . Smokeless tobacco: Never Used  . Alcohol Use: Yes     Comment: OCCAS - MAYBE ONCE A MONTH  . Drug Use: No  . Sexual Activity: Yes   Other Topics Concern  . Not on file   Social History Narrative    Past Surgical History  Procedure Laterality Date  . Tubal ligation    . Amputation      partial amputation of both index fingers  .  Vascular surgery      both hands  . Total hip arthroplasty  12/16/2011    right hip  . Breast surgery  2011    lumpectomy with node sampling- RIGHT  . Total hip arthroplasty  01/20/2012    Procedure: TOTAL HIP ARTHROPLASTY ANTERIOR APPROACH;  Surgeon: Mauri Pole, MD;  Location: [facility];  Service: Orthopedics;  Laterality: Left;    Family History  Problem Relation Age of Onset  . Cancer Mother     pt unaware of what kind  . Hearing loss Mother   . Coronary artery disease Father   . Diabetes Father   . Diabetes Sister   . Cancer Sister     breast  . Coronary artery disease Brother   . Coronary artery disease Brother     No Known Allergies  Current Outpatient Prescriptions on File Prior to Visit  Medication Sig Dispense Refill  . ibuprofen (ADVIL,MOTRIN) 200 MG tablet Take 200 mg by mouth every 6 (six) hours as needed (as needed).    . losartan-hydrochlorothiazide (HYZAAR) 100-25 MG per tablet TAKE 1 TABLET BY MOUTH EVERY DAY 90 tablet 1  . tamoxifen (NOLVADEX) 20 MG tablet Take  1 tablet (20 mg total) by mouth daily. 90 tablet 3   No current facility-administered medications on file prior to visit.    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 Risk factors, based on past  M,S,F history-  cardiovascular risk factors include hypertension.  She has a history of peripheral vascular disease.  Prior history of tobacco use  2.  Physical activities: Fairly active physically although limited by right hip osteoarthritis does swim in her outdoor pool daily.  [facility]  3.  Depression/mood: No history depression or mood disorder 4.  Hearing: No deficits  5.  ADL's: Independent in all aspects of daily living  6.  Fall risk: Moderate due to her weight and hip arthritis  7.  Home safety: No problems identified  8.  Height weight, and visual acuity; height and weight stable no change in visual acuity  9.  Counseling: Weight loss encouraged heart healthy diet recommended. Low-salt diet recommended  10. Lab orders based on risk factors: Will check a TSH and lipid profile  11. Referral : Followup orthopedic and oncology 12. Care plan: Heart healthy diet weight loss encouraged  13. Cognitive assessment: Alert and oriented with normal affect. No cognitive dysfunction.  14.  Preventive services include annual health assessment.  She will have at least annual oncology followup and mammograms.  Annual eye examination with ophthalmology.  Patient was provided with a written and personalized care plan.  Immunizations updated  15.  Provider list includes oncology primary care radiology ophthalmology and gynecology       Review of Systems  Constitutional: Negative for fever, appetite change, fatigue and unexpected weight change.  HENT: Negative for hearing loss, ear pain, nosebleeds, congestion, sore throat, mouth sores, trouble swallowing, neck stiffness, dental problem, voice change, sinus pressure and tinnitus.   Eyes: Negative for photophobia, pain, redness and visual disturbance.  Respiratory: Negative for cough, chest tightness and shortness of breath.   Cardiovascular:  Negative for chest pain, palpitations and leg swelling.  Gastrointestinal: Negative for nausea, vomiting, abdominal pain, diarrhea, constipation, blood in stool, abdominal distention and rectal pain.  Genitourinary: Negative for dysuria, urgency, frequency, hematuria, flank pain, vaginal bleeding, vaginal discharge, difficulty urinating, genital sores, vaginal pain, menstrual problem and pelvic pain.  Musculoskeletal: Positive for gait problem. Negative for back pain and arthralgias.  Skin: Negative for rash.  Neurological: Negative for dizziness, syncope, speech difficulty, weakness, light-headedness, numbness and headaches.  Hematological: Negative for adenopathy. Does not bruise/bleed easily.  Psychiatric/Behavioral: Negative for suicidal ideas, behavioral problems, self-injury, dysphoric mood  and agitation. The patient is not nervous/anxious.        Objective:   Physical Exam  Constitutional: She is oriented to person, place, and time. She appears well-developed and well-nourished.  Obese. Weight 233 Blood pressure 140/72 HENT:  Head: Normocephalic and atraumatic.  Right Ear: External ear normal.  Left Ear: External ear normal.  Mouth/Throat: Oropharynx is clear and moist.  Eyes: Conjunctivae and EOM are normal.  Neck: Normal range of motion. Neck supple. No JVD present. No thyromegaly present.  Cardiovascular: Normal rate, regular rhythm, normal heart sounds and intact distal pulses.   No murmur heard. Pulmonary/Chest: Effort normal and breath sounds normal. She has no wheezes. She has no rales.  Abdominal: Soft. Bowel sounds are normal. She exhibits no distension and no mass. There is no tenderness. There is no rebound and no guarding.  Genitourinary: Vagina normal.  Musculoskeletal: Normal range of motion. She exhibits no edema and no tenderness.  Some  missing partial digits of the hands (tips of both index fingers ) Neurological: She is alert and oriented to person, place, and  time. She has normal reflexes. No cranial nerve deficit. She exhibits normal muscle tone. Coordination normal.  Skin: Skin is warm and dry. No rash noted.  Psychiatric: She has a normal mood and affect. Her behavior is normal.          Assessment & Plan:    Preventive health Hypertension Breast cancer Status post bilateral total hip replacement  Weight loss encouraged.  Nice weight loss over the previous 12 months Recheck in 6 months or as needed Medications renewed  Review of Systems     see above Objective:   Physical Exam  Constitutional:  Weight 233 Blood pressure 140/70  HENT:  Dentures in place  Cardiovascular:  Murmur heard. Grade 2 over 6 brief systolic ejection murmur  Dorsalis pedis pulses faint.  Posterior tibial pulses not easily palpable          Assessment & Plan:   Preventive health examination Exogenous obesity.  Improved History right breast cancer.  Follow-up oncology Hypertension controlled  Weight loss encouraged Heart healthy diet recommended Recheck 6 months Gynecology followup recommended

## 2015-04-18 ENCOUNTER — Encounter: Payer: Self-pay | Admitting: *Deleted

## 2015-05-21 DIAGNOSIS — H2513 Age-related nuclear cataract, bilateral: Secondary | ICD-10-CM | POA: Diagnosis not present

## 2015-06-04 DIAGNOSIS — H25042 Posterior subcapsular polar age-related cataract, left eye: Secondary | ICD-10-CM | POA: Diagnosis not present

## 2015-06-04 DIAGNOSIS — H2511 Age-related nuclear cataract, right eye: Secondary | ICD-10-CM | POA: Diagnosis not present

## 2015-06-07 DIAGNOSIS — H268 Other specified cataract: Secondary | ICD-10-CM | POA: Diagnosis not present

## 2015-06-07 DIAGNOSIS — H2512 Age-related nuclear cataract, left eye: Secondary | ICD-10-CM | POA: Diagnosis not present

## 2015-06-29 ENCOUNTER — Telehealth: Payer: Self-pay | Admitting: Hematology and Oncology

## 2015-06-29 NOTE — Telephone Encounter (Signed)
S/w pt, confirmed appt for 4/5 @ 1.45pm.

## 2015-07-06 ENCOUNTER — Encounter: Payer: Self-pay | Admitting: Gastroenterology

## 2015-07-08 ENCOUNTER — Other Ambulatory Visit: Payer: Self-pay | Admitting: Internal Medicine

## 2015-07-25 ENCOUNTER — Encounter: Payer: Self-pay | Admitting: Hematology and Oncology

## 2015-07-25 ENCOUNTER — Telehealth: Payer: Self-pay | Admitting: Hematology and Oncology

## 2015-07-25 ENCOUNTER — Ambulatory Visit (HOSPITAL_BASED_OUTPATIENT_CLINIC_OR_DEPARTMENT_OTHER): Payer: Medicare Other | Admitting: Hematology and Oncology

## 2015-07-25 VITALS — BP 129/51 | HR 78 | Temp 97.9°F | Resp 18 | Ht 61.5 in | Wt 226.7 lb

## 2015-07-25 DIAGNOSIS — Z79810 Long term (current) use of selective estrogen receptor modulators (SERMs): Secondary | ICD-10-CM

## 2015-07-25 DIAGNOSIS — Z853 Personal history of malignant neoplasm of breast: Secondary | ICD-10-CM

## 2015-07-25 DIAGNOSIS — C50411 Malignant neoplasm of upper-outer quadrant of right female breast: Secondary | ICD-10-CM

## 2015-07-25 MED ORDER — TAMOXIFEN CITRATE 20 MG PO TABS: 20.0000 mg | ORAL_TABLET | Freq: Every day | ORAL | Status: DC

## 2015-07-25 NOTE — Progress Notes (Signed)
Unable to get in to exam room prior to MD.  No assessment performed.  

## 2015-07-25 NOTE — Assessment & Plan Note (Signed)
Right breast invasive lobular cancer T1, N0, M0 stage IA status post neoadjuvant tamoxifen followed by lumpectomy 03/28/2010 and radiation followed by tamoxifen therapy started 11/25/2010.   Tamoxifen toxicities: She is tolerating tamoxifen extremely well without any major problems or concerns. She completed 5 years of therapy. I recommended continuation of tamoxifen for 10 years. Since the patient is generally asymptomatic, she will go on extended adjuvant therapy. She will make an appointment with a gynecologist to get Pap tests annually. This is required because she is on tamoxifen.  Radiation recall: She gets periodic radiation recall reactions of the breast. She has had a total of 4 episodes until now. Each episode lasts 1-2 days associated with redness and low-grade temperature and it goes away spontaneously. Previous biopsies with dermatology have been negative.  Surveillance:  1. Mammogram 12/27/14 is normal, breast density category B  2. Breast exam 07/25/15 is normal except for the tissue density associated with the site of prior surgery and radiation.   Survivorship: Patient was instructed that she needs to have annual GYN exams while on tamoxifen. I discussed the importance of exercise and eating more fruits and vegetables and less red meat.  Return to clinic in 1 year for follow-up

## 2015-07-25 NOTE — Progress Notes (Signed)
Patient Care Team: Marletta Lor, MD as PCP - General Consuela Mimes, MD as Consulting Physician (Internal Medicine) Thea Silversmith, MD as Consulting Physician (Radiation Oncology)  DIAGNOSIS: Breast cancer of upper-outer quadrant of right female breast [facility])   Staging form: Breast, AJCC 7th Edition     Clinical: No stage assigned - Unsigned     Pathologic: Stage IA (T1a, N0, cM0) - Signed by Stark Soberano, MD on 03/21/2011   SUMMARY OF ONCOLOGIC HISTORY:   Breast cancer of upper-outer quadrant of right female breast ([place])   04/22/2009 - 03/27/2010 Anti-estrogen oral therapy Neoadjuvant antiestrogen therapy with tamoxifen for 10 months   03/28/2010 Surgery 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 Radiation recall related to tamoxifen   11/25/2010 -  Anti-estrogen oral therapy Aromasin 25 mg daily.myalgias and arthralgias stop November 2012 went back to tamoxifen 20 mg daily    CHIEF COMPLIANT: Follow-up on tamoxifen  INTERVAL HISTORY: Kristen Escobar is a 70 year old with above-mentioned history right breast cancer currently on adjuvant antiestrogen therapy with tamoxifen. She is tolerating tamoxifen extremely well. Denies any major problems with hot flashes or myalgias. She gets once a year annual checkups with her GYN. She is getting a mammogram in August of every year. She denies any lumps or nodules in the breasts.  REVIEW OF SYSTEMS:   Constitutional: Denies fevers, chills or abnormal weight loss Eyes: Denies blurriness of vision Ears, nose, mouth, throat, and face: Denies mucositis or sore throat Respiratory: Denies cough, dyspnea or wheezes Cardiovascular: Denies palpitation, chest discomfort Gastrointestinal:  Denies nausea, heartburn or change in bowel habits Skin: Denies  abnormal skin rashes Lymphatics: Denies new lymphadenopathy or easy bruising Neurological:Denies numbness, tingling or new weaknesses Behavioral/Psych: Mood is stable, no new changes  Extremities: No lower extremity edema Breast:  denies any pain or lumps or nodules in either breasts All other systems were reviewed with the patient and are negative.  I have reviewed the past medical history, past surgical history, social history and family history with the patient and they are unchanged from previous note.  ALLERGIES:  has No Known Allergies.  MEDICATIONS:  Current Outpatient Prescriptions  Medication Sig Dispense Refill  . ibuprofen (ADVIL,MOTRIN) 200 MG tablet Take 200 mg by mouth every 6 (six) hours as needed (as needed).    . losartan-hydrochlorothiazide (HYZAAR) 100-25 MG tablet TAKE 1 TABLET BY MOUTH EVERY DAY 90 tablet 1  . tamoxifen (NOLVADEX) 20 MG tablet Take 1 tablet (20 mg total) by mouth daily. 90 tablet 3  . zoster vaccine live, PF, (ZOSTAVAX) 32671 UNT/0.65ML injection Inject 19,400 Units into the skin once. 1 each 0   No current facility-administered medications for this visit.    PHYSICAL EXAMINATION: ECOG PERFORMANCE STATUS: 0 - Asymptomatic  Filed Vitals:   07/25/15 1354  BP: 129/51  Pulse: 78  Temp: 97.9 F (36.6 C)  Resp: 18   Filed Weights   07/25/15 1354  Weight: 226 lb 11.2 oz (102.83 kg)    GENERAL:alert, no distress and comfortable SKIN: skin color, texture, turgor are normal, no rashes or significant lesions EYES: normal, Conjunctiva are pink and non-injected, sclera clear OROPHARYNX:no exudate, no erythema and lips, buccal mucosa, and tongue normal  NECK: supple, thyroid normal size, non-tender, without nodularity LYMPH:  no palpable lymphadenopathy in the cervical, axillary or inguinal LUNGS:  clear to auscultation and percussion with normal breathing effort HEART: regular rate & rhythm and no murmurs and no lower extremity  edema ABDOMEN:abdomen soft, non-tender and normal bowel sounds MUSCULOSKELETAL:no cyanosis of digits and no clubbing  NEURO: alert & oriented x 3 with fluent speech, no focal motor/sensory deficits EXTREMITIES: No lower extremity edema BREAST: No palpable masses or nodules in either right or left breasts. No palpable axillary supraclavicular or infraclavicular adenopathy no breast tenderness or nipple discharge. (exam performed in the presence of a chaperone)  LABORATORY DATA:  I have reviewed the data as listed   Chemistry      Component Value Date/Time   NA 143 02/21/2015 0925   NA 140 01/18/2014 1029   NA 142 05/02/2009 1517   K 4.9 02/21/2015 0925   K 4.0 01/18/2014 1029   K 4.1 05/02/2009 1517   CL 104 02/21/2015 0925   CL 105 03/10/2012 0859   CL 100 05/02/2009 1517   CO2 30 02/21/2015 0925   CO2 27 01/18/2014 1029   CO2 29 05/02/2009 1517   BUN 22 02/21/2015 0925   BUN 15.8 01/18/2014 1029   BUN 19 05/02/2009 1517   CREATININE 1.29* 02/21/2015 0925   CREATININE 1.2* 01/18/2014 1029   CREATININE 0.9 05/02/2009 1517      Component Value Date/Time   CALCIUM 9.8 02/21/2015 0925   CALCIUM 9.1 01/18/2014 1029   CALCIUM 9.0 05/02/2009 1517   ALKPHOS 49 02/21/2015 0925   ALKPHOS 50 01/18/2014 1029   ALKPHOS 65 05/02/2009 1517   AST 15 02/21/2015 0925   AST 14 01/18/2014 1029   AST 24 05/02/2009 1517   ALT 14 02/21/2015 0925   ALT 10 01/18/2014 1029   ALT 17 05/02/2009 1517   BILITOT 0.6 02/21/2015 0925   BILITOT 0.37 01/18/2014 1029   BILITOT 0.60 05/02/2009 1517       Lab Results  Component Value Date   WBC 8.0 02/21/2015   HGB 13.6 02/21/2015   HCT 42.5 02/21/2015   MCV 87.4 02/21/2015   PLT 283.0 02/21/2015   NEUTROABS 4.9 02/21/2015     ASSESSMENT & PLAN:  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   Tamoxifen toxicities: She is tolerating tamoxifen extremely well without any major problems or concerns. She completed 5 years of therapy. I recommended continuation of tamoxifen for 10 years. Since the patient is generally asymptomatic, she will go on extended adjuvant therapy. She will make an appointment with a gynecologist to get Pap tests annually. This is required because she is on tamoxifen.  Radiation recall: She used to get periodic radiation recall reactions of the breast. She has had a total of 4 episodes until now. Each episode lasts 1-2 days associated with redness and low-grade temperature and it goes away spontaneously. Previous biopsies with dermatology have been negative.  Surveillance:  1. Mammogram 12/27/14 is normal, breast density category B  2. Breast exam 07/25/15 is normal except for the tissue density associated with the site of prior surgery and radiation.   Survivorship: Patient was instructed that she needs to have annual GYN exams while on tamoxifen. I discussed the importance of exercise and eating more fruits and vegetables and less red meat.  Return to clinic in 1 year for follow-up   No orders of the defined types were placed in  this encounter.   The patient has a good understanding of the overall plan. she agrees with it. she will call with any problems that may develop before the next visit here.   Rulon Eisenmenger, MD 07/25/2015

## 2015-07-25 NOTE — Telephone Encounter (Signed)
appt made and avs printed °

## 2015-08-22 ENCOUNTER — Encounter: Payer: Self-pay | Admitting: Internal Medicine

## 2015-08-22 ENCOUNTER — Ambulatory Visit (INDEPENDENT_AMBULATORY_CARE_PROVIDER_SITE_OTHER): Payer: Medicare Other | Admitting: Internal Medicine

## 2015-08-22 VITALS — BP 120/70 | HR 60 | Temp 97.8°F | Resp 20 | Ht 61.5 in | Wt 223.0 lb

## 2015-08-22 DIAGNOSIS — N183 Chronic kidney disease, stage 3 unspecified: Secondary | ICD-10-CM

## 2015-08-22 DIAGNOSIS — M150 Primary generalized (osteo)arthritis: Secondary | ICD-10-CM

## 2015-08-22 DIAGNOSIS — R946 Abnormal results of thyroid function studies: Secondary | ICD-10-CM | POA: Diagnosis not present

## 2015-08-22 DIAGNOSIS — R7989 Other specified abnormal findings of blood chemistry: Secondary | ICD-10-CM | POA: Insufficient documentation

## 2015-08-22 DIAGNOSIS — R0989 Other specified symptoms and signs involving the circulatory and respiratory systems: Secondary | ICD-10-CM | POA: Diagnosis not present

## 2015-08-22 DIAGNOSIS — N189 Chronic kidney disease, unspecified: Secondary | ICD-10-CM | POA: Insufficient documentation

## 2015-08-22 DIAGNOSIS — R7302 Impaired glucose tolerance (oral): Secondary | ICD-10-CM

## 2015-08-22 DIAGNOSIS — M159 Polyosteoarthritis, unspecified: Secondary | ICD-10-CM

## 2015-08-22 DIAGNOSIS — I10 Essential (primary) hypertension: Secondary | ICD-10-CM

## 2015-08-22 HISTORY — DX: Chronic kidney disease, unspecified: N18.9

## 2015-08-22 LAB — TSH: TSH: 6.54 u[IU]/mL — ABNORMAL HIGH (ref 0.35–4.50)

## 2015-08-22 NOTE — Progress Notes (Signed)
Subjective:    Patient ID: Kristen Escobar, female    DOB: June 03, 1945, 70 y.o.   MRN: [id number]  HPI  Wt Readings from Last 3 Encounters:  08/22/15 223 lb (101.152 kg)  07/25/15 226 lb 11.2 oz (102.83 kg)  02/21/15 233 lb (105.51 kg)   70 year old patient who is seen today for her six-month follow-up.  She is doing quite well and has been quite active.  She has lost 10 pounds in weight since her physical in the fall.  She generally feels well  Laboratory studies reviewed that revealed a fasting blood sugar of 118 and a slight elevation of TSH.  GFR was in the range of 44.  She has hypertension which has been well controlled.  No new concerns or complaints  Past Medical History  Diagnosis Date  . Hypertension   . Obesity   . Cancer ([place]) 03/2009    breast- rt  . Peripheral vascular disease ([place]) 1995    PT DEVELOPED CIRCULATION PROBLEMS IN BOTH HANDS AND GANGRENE OF BOTH INDEX FINGERS--REQUIRING AMPUTATION OF THE INDEX FINGERS AND VASCULAR SURGERY.  PT TOLD HER PROBLEMS RELATED TO SMOKING.   NO OTHER PROBLEMS SINCE.  Marland Kitchen Arthritis   . History of radiation therapy 07/12/10,completed    right breast 60 Gy x30 fx     Social History   Social History  . Marital Status: Married    Spouse Name: N/A  . Number of Children: N/A  . Years of Education: N/A   Occupational History  . Not on file.   Social History Main Topics  . Smoking status: Former Smoker -- 1.00 packs/day for 20 years    Quit date: 04/21/1993  . Smokeless tobacco: Never Used  . Alcohol Use: Yes     Comment: OCCAS - MAYBE ONCE A MONTH  . Drug Use: No  . Sexual Activity: Yes   Other Topics Concern  . Not on file   Social History Narrative    Past Surgical History  Procedure Laterality Date  . Tubal ligation    . Amputation      partial amputation of both index fingers  . Vascular surgery      both hands  . Total hip arthroplasty  12/16/2011    right hip  . Breast surgery  2011    lumpectomy with node  sampling- RIGHT  . Total hip arthroplasty  01/20/2012    Procedure: TOTAL HIP ARTHROPLASTY ANTERIOR APPROACH;  Surgeon: Mauri Pole, MD;  Location: [facility];  Service: Orthopedics;  Laterality: Left;    Family History  Problem Relation Age of Onset  . Cancer Mother     pt unaware of what kind  . Hearing loss Mother   . Coronary artery disease Father   . Diabetes Father   . Diabetes Sister   . Cancer Sister     breast  . Coronary artery disease Brother   . Coronary artery disease Brother     No Known Allergies  Current Outpatient Prescriptions on File Prior to Visit  Medication Sig Dispense Refill  . ibuprofen (ADVIL,MOTRIN) 200 MG tablet Take 200 mg by mouth every 6 (six) hours as needed (as needed).    . losartan-hydrochlorothiazide (HYZAAR) 100-25 MG tablet TAKE 1 TABLET BY MOUTH EVERY DAY 90 tablet 1  . tamoxifen (NOLVADEX) 20 MG tablet Take 1 tablet (20 mg total) by mouth daily. 90 tablet 3   No current facility-administered medications on file prior to visit.    BP 120/70  mmHg  Pulse 60  Temp(Src) 97.8 F (36.6 C) (Oral)  Resp 20  Ht 5' 1.5" (1.562 m)  Wt 223 lb (101.152 kg)  BMI 41.46 kg/m2  SpO2 97%      Review of Systems  Constitutional: Negative.   HENT: Negative for congestion, dental problem, hearing loss, rhinorrhea, sinus pressure, sore throat and tinnitus.   Eyes: Negative for pain, discharge and visual disturbance.  Respiratory: Negative for cough and shortness of breath.   Cardiovascular: Negative for chest pain, palpitations and leg swelling.  Gastrointestinal: Negative for nausea, vomiting, abdominal pain, diarrhea, constipation, blood in stool and abdominal distention.  Genitourinary: Negative for dysuria, urgency, frequency, hematuria, flank pain, vaginal bleeding, vaginal discharge, difficulty urinating, vaginal pain and pelvic pain.  Musculoskeletal: Negative for joint swelling, arthralgias and gait problem.  Skin: Negative for rash.    Neurological: Negative for dizziness, syncope, speech difficulty, weakness, numbness and headaches.  Hematological: Negative for adenopathy.  Psychiatric/Behavioral: Negative for behavioral problems, dysphoric mood and agitation. The patient is not nervous/anxious.        Objective:   Physical Exam  Constitutional: She is oriented to person, place, and time. She appears well-developed and well-nourished.  HENT:  Head: Normocephalic.  Right Ear: External ear normal.  Left Ear: External ear normal.  Mouth/Throat: Oropharynx is clear and moist.  Eyes: Conjunctivae and EOM are normal. Pupils are equal, round, and reactive to light.  Neck: Normal range of motion. Neck supple. No thyromegaly present.  Bilateral carotid bruits, right greater than the left.  Prominent with the patient standing or sitting, but difficult to hear with the patient supine  Cardiovascular: Normal rate, regular rhythm, normal heart sounds and intact distal pulses.   Faint murmur or more likely transmitted carotid bruit- Not heard well with the patient supine  Pulmonary/Chest: Effort normal and breath sounds normal.  Abdominal: Soft. Bowel sounds are normal. She exhibits no mass. There is no tenderness.  Musculoskeletal: Normal range of motion.  Lymphadenopathy:    She has no cervical adenopathy.  Neurological: She is alert and oriented to person, place, and time.  Skin: Skin is warm and dry. No rash noted.  Psychiatric: She has a normal mood and affect. Her behavior is normal.          Assessment & Plan:   Hypertension, well-controlled Mild elevation TSH.  We'll repeat Rule out carotid artery stenosis.  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  Will check a carotid artery ultrasound Rule out impaired glucose tolerance.  Ongoing weight loss and exercise encouraged Chronic kidney disease with GFR in the range of 44  CPX 6 months Follow-up  oncology

## 2015-08-22 NOTE — Patient Instructions (Signed)
Limit your sodium (Salt) intake  Please check your blood pressure on a regular basis.  If it is consistently greater than 150/90, please make an office appointment.    It is important that you exercise regularly, at least 20 minutes 3 to 4 times per week.  If you develop chest pain or shortness of breath seek  medical attention.  You need to lose weight.  Consider a lower calorie diet and regular exercise.  Return in 6 months for follow-up  Carotid artery ultrasound as discussed

## 2015-08-22 NOTE — Progress Notes (Signed)
Pre visit review using our clinic review tool, if applicable. No additional management support is needed unless otherwise documented below in the visit note. 

## 2015-08-23 ENCOUNTER — Other Ambulatory Visit: Payer: Self-pay | Admitting: *Deleted

## 2015-08-23 DIAGNOSIS — R7989 Other specified abnormal findings of blood chemistry: Secondary | ICD-10-CM

## 2015-08-23 MED ORDER — LEVOTHYROXINE SODIUM 50 MCG PO TABS
50.0000 ug | ORAL_TABLET | Freq: Every day | ORAL | Status: DC
Start: 2015-08-23 — End: 2016-02-17

## 2015-08-30 ENCOUNTER — Ambulatory Visit (HOSPITAL_COMMUNITY)
Admission: RE | Admit: 2015-08-30 | Discharge: 2015-08-30 | Disposition: A | Discharge status: Home / Self Care | Payer: Medicare Other | Source: Ambulatory Visit | Attending: Cardiovascular Disease | Admitting: Cardiovascular Disease

## 2015-08-30 DIAGNOSIS — R0989 Other specified symptoms and signs involving the circulatory and respiratory systems: Secondary | ICD-10-CM | POA: Diagnosis not present

## 2015-08-30 DIAGNOSIS — I10 Essential (primary) hypertension: Secondary | ICD-10-CM | POA: Diagnosis not present

## 2015-08-30 DIAGNOSIS — I6523 Occlusion and stenosis of bilateral carotid arteries: Secondary | ICD-10-CM | POA: Diagnosis not present

## 2015-10-15 ENCOUNTER — Encounter: Payer: Self-pay | Admitting: Internal Medicine

## 2015-10-15 ENCOUNTER — Ambulatory Visit (INDEPENDENT_AMBULATORY_CARE_PROVIDER_SITE_OTHER): Payer: Medicare Other | Admitting: Internal Medicine

## 2015-10-15 VITALS — BP 130/60 | HR 61 | Temp 97.8°F | Resp 20 | Ht 61.5 in | Wt 216.0 lb

## 2015-10-15 DIAGNOSIS — N183 Chronic kidney disease, stage 3 unspecified: Secondary | ICD-10-CM

## 2015-10-15 DIAGNOSIS — R946 Abnormal results of thyroid function studies: Secondary | ICD-10-CM | POA: Diagnosis not present

## 2015-10-15 DIAGNOSIS — I10 Essential (primary) hypertension: Secondary | ICD-10-CM | POA: Diagnosis not present

## 2015-10-15 DIAGNOSIS — R7989 Other specified abnormal findings of blood chemistry: Secondary | ICD-10-CM

## 2015-10-15 DIAGNOSIS — R7302 Impaired glucose tolerance (oral): Secondary | ICD-10-CM

## 2015-10-15 LAB — TSH: TSH: 3.78 u[IU]/mL (ref 0.35–4.50)

## 2015-10-15 MED ORDER — ATORVASTATIN CALCIUM 10 MG PO TABS: 10.0000 mg | ORAL_TABLET | Freq: Every day | ORAL | Status: DC

## 2015-10-15 NOTE — Patient Instructions (Signed)
Limit your sodium (Salt) intake  You need to lose weight.  Consider a lower calorie diet and regular exercise.    It is important that you exercise regularly, at least 20 minutes 3 to 4 times per week.  If you develop chest pain or shortness of breath seek  medical attention.   

## 2015-10-15 NOTE — Progress Notes (Signed)
Subjective:    Patient ID: Kristen Escobar, female    DOB: [birth year], 70 y.o.   MRN: [id number]  HPI  70 year old patient who is seen today for follow-up of an elevated TSH. Since her last visit here, she has had follow-up carotid artery Doppler studies that  revealed stable carotid artery stenosis.  She feels well today The patient is now on levothyroxine 50 g daily. Low intensity statin therapy discussed today due to her vascular disease  Wt Readings from Last 3 Encounters:  10/15/15 216 lb (97.977 kg)  08/22/15 223 lb (101.152 kg)  07/25/15 226 lb 11.2 oz (102.83 kg)    Past Medical History  Diagnosis Date  . Hypertension   . Obesity   . Cancer ([place]) 03/2009    breast- rt  . Peripheral vascular disease ([place]) 1995    PT DEVELOPED CIRCULATION PROBLEMS IN BOTH HANDS AND GANGRENE OF BOTH INDEX FINGERS--REQUIRING AMPUTATION OF THE INDEX FINGERS AND VASCULAR SURGERY.  PT TOLD HER PROBLEMS RELATED TO SMOKING.   NO OTHER PROBLEMS SINCE.  Marland Kitchen Arthritis   . History of radiation therapy 07/12/10,completed    right breast 60 Gy x30 fx     Social History   Social History  . Marital Status: Married    Spouse Name: N/A  . Number of Children: N/A  . Years of Education: N/A   Occupational History  . Not on file.   Social History Main Topics  . Smoking status: Former Smoker -- 1.00 packs/day for 20 years    Quit date: 04/21/1993  . Smokeless tobacco: Never Used  . Alcohol Use: Yes     Comment: OCCAS - MAYBE ONCE A MONTH  . Drug Use: No  . Sexual Activity: Yes   Other Topics Concern  . Not on file   Social History Narrative    Past Surgical History  Procedure Laterality Date  . Tubal ligation    . Amputation      partial amputation of both index fingers  . Vascular surgery      both hands  . Total hip arthroplasty  12/16/2011    right hip  . Breast surgery  2011    lumpectomy with node sampling- RIGHT  . Total hip arthroplasty  01/20/2012    Procedure: TOTAL HIP  ARTHROPLASTY ANTERIOR APPROACH;  Surgeon: Mauri Pole, MD;  Location: [facility];  Service: Orthopedics;  Laterality: Left;    Family History  Problem Relation Age of Onset  . Cancer Mother     pt unaware of what kind  . Hearing loss Mother   . Coronary artery disease Father   . Diabetes Father   . Diabetes Sister   . Cancer Sister     breast  . Coronary artery disease Brother   . Coronary artery disease Brother     No Known Allergies  Current Outpatient Prescriptions on File Prior to Visit  Medication Sig Dispense Refill  . ibuprofen (ADVIL,MOTRIN) 200 MG tablet Take 200 mg by mouth every 6 (six) hours as needed (as needed).    Marland Kitchen levothyroxine (SYNTHROID, LEVOTHROID) 50 MCG tablet Take 1 tablet (50 mcg total) by mouth daily. 90 tablet 1  . losartan-hydrochlorothiazide (HYZAAR) 100-25 MG tablet TAKE 1 TABLET BY MOUTH EVERY DAY 90 tablet 1  . tamoxifen (NOLVADEX) 20 MG tablet Take 1 tablet (20 mg total) by mouth daily. 90 tablet 3   No current facility-administered medications on file prior to visit.    BP 130/60 mmHg  Pulse  61  Temp(Src) 97.8 F (36.6 C) (Oral)  Resp 20  Ht 5' 1.5" (1.562 m)  Wt 216 lb (97.977 kg)  BMI 40.16 kg/m2  SpO2 96%     Review of Systems  Constitutional: Negative.   HENT: Negative for congestion, dental problem, hearing loss, rhinorrhea, sinus pressure, sore throat and tinnitus.   Eyes: Negative for pain, discharge and visual disturbance.  Respiratory: Negative for cough and shortness of breath.   Cardiovascular: Negative for chest pain, palpitations and leg swelling.  Gastrointestinal: Negative for nausea, vomiting, abdominal pain, diarrhea, constipation, blood in stool and abdominal distention.  Genitourinary: Negative for dysuria, urgency, frequency, hematuria, flank pain, vaginal bleeding, vaginal discharge, difficulty urinating, vaginal pain and pelvic pain.  Musculoskeletal: Negative for joint swelling, arthralgias and gait problem.    Skin: Negative for rash.  Neurological: Negative for dizziness, syncope, speech difficulty, weakness, numbness and headaches.  Hematological: Negative for adenopathy.  Psychiatric/Behavioral: Negative for behavioral problems, dysphoric mood and agitation. The patient is not nervous/anxious.        Objective:   Physical Exam  Constitutional: She appears well-developed and well-nourished. No distress.  Blood pressure 130/60          Assessment & Plan:   Elevated TSH/subclinical hypothyroidism.  Have elected to treat with supplemental levothyroxine.  Will recheck TSH Carotid artery stenosis, stable.  We'll start low intensity statin therapy  Recheck 6 months  Nyoka Cowden, MD

## 2015-10-15 NOTE — Progress Notes (Signed)
Pre visit review using our clinic review tool, if applicable. No additional management support is needed unless otherwise documented below in the visit note. 

## 2015-12-03 ENCOUNTER — Telehealth: Payer: Self-pay | Admitting: Internal Medicine

## 2015-12-03 ENCOUNTER — Other Ambulatory Visit: Payer: Self-pay | Admitting: Internal Medicine

## 2015-12-03 ENCOUNTER — Encounter: Payer: Self-pay | Admitting: Gastroenterology

## 2015-12-03 DIAGNOSIS — C50911 Malignant neoplasm of unspecified site of right female breast: Secondary | ICD-10-CM

## 2015-12-03 NOTE — Telephone Encounter (Signed)
Pt has an appt for mammogram on 12-28-15 and would like order for bone density test same day. Please fax order to breast center [phone]

## 2015-12-04 NOTE — Telephone Encounter (Signed)
Order faxed over and patient notified.

## 2015-12-04 NOTE — Telephone Encounter (Signed)
Okay for bone density

## 2015-12-05 ENCOUNTER — Other Ambulatory Visit: Payer: Self-pay | Admitting: Internal Medicine

## 2015-12-05 DIAGNOSIS — R5381 Other malaise: Secondary | ICD-10-CM

## 2015-12-06 ENCOUNTER — Other Ambulatory Visit: Payer: Self-pay | Admitting: Internal Medicine

## 2015-12-06 DIAGNOSIS — M810 Age-related osteoporosis without current pathological fracture: Secondary | ICD-10-CM

## 2015-12-11 ENCOUNTER — Other Ambulatory Visit: Payer: Self-pay | Admitting: *Deleted

## 2015-12-11 DIAGNOSIS — E2839 Other primary ovarian failure: Secondary | ICD-10-CM

## 2015-12-28 ENCOUNTER — Ambulatory Visit
Admission: RE | Admit: 2015-12-28 | Discharge: 2015-12-28 | Disposition: A | Discharge status: Home / Self Care | Payer: Medicare Other | Source: Ambulatory Visit | Attending: Internal Medicine | Admitting: Internal Medicine

## 2015-12-28 ENCOUNTER — Other Ambulatory Visit: Payer: Self-pay | Admitting: Internal Medicine

## 2015-12-28 DIAGNOSIS — Z13820 Encounter for screening for osteoporosis: Secondary | ICD-10-CM | POA: Diagnosis not present

## 2015-12-28 DIAGNOSIS — Z780 Asymptomatic menopausal state: Secondary | ICD-10-CM | POA: Diagnosis not present

## 2015-12-28 DIAGNOSIS — C50911 Malignant neoplasm of unspecified site of right female breast: Secondary | ICD-10-CM

## 2015-12-28 DIAGNOSIS — E2839 Other primary ovarian failure: Secondary | ICD-10-CM

## 2015-12-28 DIAGNOSIS — R928 Other abnormal and inconclusive findings on diagnostic imaging of breast: Secondary | ICD-10-CM | POA: Diagnosis not present

## 2016-01-02 DIAGNOSIS — D2371 Other benign neoplasm of skin of right lower limb, including hip: Secondary | ICD-10-CM | POA: Diagnosis not present

## 2016-01-02 DIAGNOSIS — L570 Actinic keratosis: Secondary | ICD-10-CM | POA: Diagnosis not present

## 2016-01-02 DIAGNOSIS — L821 Other seborrheic keratosis: Secondary | ICD-10-CM | POA: Diagnosis not present

## 2016-01-02 DIAGNOSIS — L814 Other melanin hyperpigmentation: Secondary | ICD-10-CM | POA: Diagnosis not present

## 2016-01-02 DIAGNOSIS — D225 Melanocytic nevi of trunk: Secondary | ICD-10-CM | POA: Diagnosis not present

## 2016-01-02 DIAGNOSIS — L565 Disseminated superficial actinic porokeratosis (DSAP): Secondary | ICD-10-CM | POA: Diagnosis not present

## 2016-01-02 DIAGNOSIS — L718 Other rosacea: Secondary | ICD-10-CM | POA: Diagnosis not present

## 2016-01-02 DIAGNOSIS — D1801 Hemangioma of skin and subcutaneous tissue: Secondary | ICD-10-CM | POA: Diagnosis not present

## 2016-01-04 ENCOUNTER — Other Ambulatory Visit: Payer: Self-pay | Admitting: Internal Medicine

## 2016-01-09 DIAGNOSIS — H2511 Age-related nuclear cataract, right eye: Secondary | ICD-10-CM | POA: Diagnosis not present

## 2016-01-10 DIAGNOSIS — Z79810 Long term (current) use of selective estrogen receptor modulators (SERMs): Secondary | ICD-10-CM | POA: Diagnosis not present

## 2016-01-10 DIAGNOSIS — Z79890 Hormone replacement therapy (postmenopausal): Secondary | ICD-10-CM | POA: Diagnosis not present

## 2016-01-10 DIAGNOSIS — Z124 Encounter for screening for malignant neoplasm of cervix: Secondary | ICD-10-CM | POA: Diagnosis not present

## 2016-01-10 DIAGNOSIS — Z853 Personal history of malignant neoplasm of breast: Secondary | ICD-10-CM | POA: Diagnosis not present

## 2016-01-10 DIAGNOSIS — Z8049 Family history of malignant neoplasm of other genital organs: Secondary | ICD-10-CM | POA: Diagnosis not present

## 2016-01-22 DIAGNOSIS — Z961 Presence of intraocular lens: Secondary | ICD-10-CM | POA: Diagnosis not present

## 2016-01-22 DIAGNOSIS — H25042 Posterior subcapsular polar age-related cataract, left eye: Secondary | ICD-10-CM | POA: Diagnosis not present

## 2016-01-22 DIAGNOSIS — H2511 Age-related nuclear cataract, right eye: Secondary | ICD-10-CM | POA: Diagnosis not present

## 2016-01-22 DIAGNOSIS — H43813 Vitreous degeneration, bilateral: Secondary | ICD-10-CM | POA: Diagnosis not present

## 2016-01-28 ENCOUNTER — Ambulatory Visit (INDEPENDENT_AMBULATORY_CARE_PROVIDER_SITE_OTHER): Payer: Medicare Other | Admitting: *Deleted

## 2016-01-28 DIAGNOSIS — Z23 Encounter for immunization: Secondary | ICD-10-CM

## 2016-01-28 DIAGNOSIS — H2511 Age-related nuclear cataract, right eye: Secondary | ICD-10-CM | POA: Diagnosis not present

## 2016-01-31 DIAGNOSIS — H2511 Age-related nuclear cataract, right eye: Secondary | ICD-10-CM | POA: Diagnosis not present

## 2016-02-04 ENCOUNTER — Ambulatory Visit (AMBULATORY_SURGERY_CENTER): Payer: Self-pay

## 2016-02-04 VITALS — Ht 62.0 in | Wt 221.8 lb

## 2016-02-04 DIAGNOSIS — Z1211 Encounter for screening for malignant neoplasm of colon: Secondary | ICD-10-CM

## 2016-02-04 MED ORDER — SUPREP BOWEL PREP KIT 17.5-3.13-1.6 GM/177ML PO SOLN: 1.0000 | Freq: Once | ORAL | 0 refills | Status: AC

## 2016-02-04 NOTE — Progress Notes (Signed)
No allergies to eggs or soy No past problems with anesthesia No diet meds No home oxygen  Declined emmi 

## 2016-02-14 ENCOUNTER — Encounter: Payer: Self-pay | Admitting: Gastroenterology

## 2016-02-14 ENCOUNTER — Ambulatory Visit (AMBULATORY_SURGERY_CENTER): Payer: Medicare Other | Admitting: Gastroenterology

## 2016-02-14 VITALS — BP 122/56 | HR 66 | Temp 97.5°F | Resp 17 | Ht 62.0 in | Wt 221.0 lb

## 2016-02-14 DIAGNOSIS — Z1212 Encounter for screening for malignant neoplasm of rectum: Secondary | ICD-10-CM | POA: Diagnosis not present

## 2016-02-14 DIAGNOSIS — D123 Benign neoplasm of transverse colon: Secondary | ICD-10-CM

## 2016-02-14 DIAGNOSIS — E669 Obesity, unspecified: Secondary | ICD-10-CM | POA: Diagnosis not present

## 2016-02-14 DIAGNOSIS — Z1211 Encounter for screening for malignant neoplasm of colon: Secondary | ICD-10-CM

## 2016-02-14 DIAGNOSIS — I739 Peripheral vascular disease, unspecified: Secondary | ICD-10-CM | POA: Diagnosis not present

## 2016-02-14 DIAGNOSIS — I10 Essential (primary) hypertension: Secondary | ICD-10-CM | POA: Diagnosis not present

## 2016-02-14 MED ORDER — SODIUM CHLORIDE 0.9 % IV SOLN: 500.0000 mL | INTRAVENOUS | Status: DC

## 2016-02-14 NOTE — Progress Notes (Signed)
Called to room to assist during endoscopic procedure.  Patient ID and intended procedure confirmed with present staff. Received instructions for my participation in the procedure from the performing physician.  

## 2016-02-14 NOTE — Patient Instructions (Signed)
YOU HAD AN ENDOSCOPIC PROCEDURE TODAY AT [facility]:   Refer to the procedure report that was given to you for any specific questions about what was found during the examination.  If the procedure report does not answer your questions, please call your gastroenterologist to clarify.  If you requested that your care partner not be given the details of your procedure findings, then the procedure report has been included in a sealed envelope for you to review at your convenience later.  YOU SHOULD EXPECT: Some feelings of bloating in the abdomen. Passage of more gas than usual.  Walking can help get rid of the air that was put into your GI tract during the procedure and reduce the bloating. If you had a lower endoscopy (such as a colonoscopy or flexible sigmoidoscopy) you may notice spotting of blood in your stool or on the toilet paper. If you underwent a bowel prep for your procedure, you may not have a normal bowel movement for a few days.  Please Note:  You might notice some irritation and congestion in your nose or some drainage.  This is from the oxygen used during your procedure.  There is no need for concern and it should clear up in a day or so.  SYMPTOMS TO REPORT IMMEDIATELY:   Following lower endoscopy (colonoscopy or flexible sigmoidoscopy):  Excessive amounts of blood in the stool  Significant tenderness or worsening of abdominal pains  Swelling of the abdomen that is new, acute  Fever of 100F or higher   For urgent or emergent issues, a gastroenterologist can be reached at any hour by calling [phone].   DIET:  We do recommend a small meal at first, but then you may proceed to your regular diet.  Drink plenty of fluids but you should avoid alcoholic beverages for 24 hours. Try to increase the fiber in you diet, and drink plenty of water.    ACTIVITY:  You should plan to take it easy for the rest of today and you should NOT DRIVE or use heavy machinery until  tomorrow (because of the sedation medicines used during the test).    FOLLOW UP: Our staff will call the number listed on your records the next business day following your procedure to check on you and address any questions or concerns that you may have regarding the information given to you following your procedure. If we do not reach you, we will leave a message.  However, if you are feeling well and you are not experiencing any problems, there is no need to return our call.  We will assume that you have returned to your regular daily activities without incident.  If any biopsies were taken you will be contacted by phone or by letter within the next 1-3 weeks.  Please call us at [phone] if you have not heard about the biopsies in 3 weeks.    SIGNATURES/CONFIDENTIALITY: You and/or your care partner have signed paperwork which will be entered into your electronic medical record.  These signatures attest to the fact that that the information above on your After Visit Summary has been reviewed and is understood.  Full responsibility of the confidentiality of this discharge information lies with you and/or your care-partner.  Read all of the information given to you by your recovery room nurse.  Thank-you for choosing [place] for your healthcare needs today.

## 2016-02-14 NOTE — Op Note (Signed)
[place] Patient Name: Yury Zepf Procedure Date: 02/14/2016 11:55 AM MRN: [id number] Endoscopist: Remo Lipps P. Armbruster MD, MD Age: 70 Referring MD:  Date of Birth: 28-Nov-1945 Gender: Female Account #: [id number] Procedure:                Colonoscopy Indications:              Screening for malignant neoplasm in the colon Medicines:                Monitored Anesthesia Care Procedure:                Pre-Anesthesia Assessment:                           - Prior to the procedure, a History and Physical                            was performed, and patient medications and                            allergies were reviewed. [name] tolerance of                            previous anesthesia was also reviewed. The risks                            and benefits of the procedure and the sedation                            options and risks were discussed with the patient.                            All questions were answered, and informed consent                            was obtained. Prior Anticoagulants: The patient has                            taken no previous anticoagulant or antiplatelet                            agents. ASA Grade Assessment: III - A patient with                            severe systemic disease. After reviewing the risks                            and benefits, the patient was deemed in                            satisfactory condition to undergo the procedure.                           After obtaining informed consent, the colonoscope  was passed under direct vision. Throughout the                            procedure, the patient's blood pressure, pulse, and                            oxygen saturations were monitored continuously. The                            Model CF-HQ190L [id number]) scope was introduced                            through the anus and advanced to the the cecum,   identified by appendiceal orifice and ileocecal                            valve. The colonoscopy was performed without                            difficulty. The patient tolerated the procedure                            well. The quality of the bowel preparation was                            good. The ileocecal valve, appendiceal orifice, and                            rectum were photographed. Scope In: 11:59:19 AM Scope Out: 12:14:52 PM Scope Withdrawal Time: 0 hours 13 minutes 31 seconds  Total Procedure Duration: 0 hours 15 minutes 33 seconds  Findings:                 The perianal and digital rectal examinations were                            normal.                           A 6-7 mm flat polyp was found in the proximal                            transverse colon. The polyp was sessile. The polyp                            was removed with a cold snare. Resection and                            retrieval were complete.                           Multiple medium-mouthed diverticula were found in                            the left colon.  Anal papilla(e) were hypertrophied.                           The exam was otherwise normal throughout the                            examined colon. Complications:            No immediate complications. Estimated blood loss:                            Minimal. Estimated Blood Loss:     Estimated blood loss was minimal. Impression:               - One 6-7 mm polyp in the proximal transverse                            colon, removed with a cold snare. Resected and                            retrieved.                           - Diverticulosis in the left colon.                           - Anal papilla(e) were hypertrophied. Recommendation:           - Patient has a contact number available for                            emergencies. The signs and symptoms of potential                            delayed complications  were discussed with the                            patient. Return to normal activities tomorrow.                            Written discharge instructions were provided to the                            patient.                           - Resume previous diet.                           - Continue present medications.                           - No ibuprofen, naproxen, or other non-steroidal                            anti-inflammatory drugs for 2 weeks after polyp  removal.                           - Await pathology results.                           - Repeat colonoscopy is recommended for                            surveillance. The colonoscopy date will be                            determined after pathology results from today's                            exam become available for review. Remo Lipps P. Armbruster MD, MD 02/14/2016 12:18:09 PM This report has been signed electronically.

## 2016-02-14 NOTE — Progress Notes (Signed)
To PACU, vss patent aw report to rn 

## 2016-02-15 ENCOUNTER — Telehealth: Payer: Self-pay

## 2016-02-15 NOTE — Telephone Encounter (Signed)
  Follow up Call-  Call back number 02/14/2016  Post procedure Call Back phone  # [phone]  Permission to leave phone message Yes  Some recent data might be hidden     Patient questions:  Do you have a fever, pain , or abdominal swelling? No. Pain Score  0 *  Have you tolerated food without any problems? Yes.    Have you been able to return to your normal activities? Yes.    Do you have any questions about your discharge instructions: Diet   No. Medications  No. Follow up visit  No.  Do you have questions or concerns about your Care? no  Actions: * If pain score is 4 or above: No action needed, pain <4.

## 2016-02-17 ENCOUNTER — Other Ambulatory Visit: Payer: Self-pay | Admitting: Internal Medicine

## 2016-02-20 ENCOUNTER — Encounter: Payer: Self-pay | Admitting: Gastroenterology

## 2016-02-25 ENCOUNTER — Ambulatory Visit: Payer: Medicare Other | Admitting: Internal Medicine

## 2016-03-25 ENCOUNTER — Ambulatory Visit (INDEPENDENT_AMBULATORY_CARE_PROVIDER_SITE_OTHER): Payer: Medicare Other | Admitting: Internal Medicine

## 2016-03-25 ENCOUNTER — Encounter: Payer: Self-pay | Admitting: Internal Medicine

## 2016-03-25 VITALS — BP 150/62 | HR 63 | Temp 97.9°F | Ht 62.0 in | Wt 222.8 lb

## 2016-03-25 DIAGNOSIS — I10 Essential (primary) hypertension: Secondary | ICD-10-CM

## 2016-03-25 DIAGNOSIS — R946 Abnormal results of thyroid function studies: Secondary | ICD-10-CM

## 2016-03-25 DIAGNOSIS — N182 Chronic kidney disease, stage 2 (mild): Secondary | ICD-10-CM | POA: Diagnosis not present

## 2016-03-25 DIAGNOSIS — E039 Hypothyroidism, unspecified: Secondary | ICD-10-CM | POA: Diagnosis not present

## 2016-03-25 DIAGNOSIS — Z0000 Encounter for general adult medical examination without abnormal findings: Secondary | ICD-10-CM

## 2016-03-25 DIAGNOSIS — R7302 Impaired glucose tolerance (oral): Secondary | ICD-10-CM

## 2016-03-25 DIAGNOSIS — R0989 Other specified symptoms and signs involving the circulatory and respiratory systems: Secondary | ICD-10-CM

## 2016-03-25 DIAGNOSIS — R7989 Other specified abnormal findings of blood chemistry: Secondary | ICD-10-CM

## 2016-03-25 DIAGNOSIS — M150 Primary generalized (osteo)arthritis: Secondary | ICD-10-CM | POA: Diagnosis not present

## 2016-03-25 DIAGNOSIS — E785 Hyperlipidemia, unspecified: Secondary | ICD-10-CM | POA: Diagnosis not present

## 2016-03-25 DIAGNOSIS — M159 Polyosteoarthritis, unspecified: Secondary | ICD-10-CM

## 2016-03-25 LAB — COMPREHENSIVE METABOLIC PANEL
ALBUMIN: 3.7 g/dL (ref 3.5–5.2)
ALT: 11 U/L (ref 0–35)
AST: 12 U/L (ref 0–37)
Alkaline Phosphatase: 47 U/L (ref 39–117)
BUN: 19 mg/dL (ref 6–23)
CALCIUM: 9 mg/dL (ref 8.4–10.5)
CHLORIDE: 106 meq/L (ref 96–112)
CO2: 25 mEq/L (ref 19–32)
CREATININE: 1.22 mg/dL — AB (ref 0.40–1.20)
GFR: 46.37 mL/min — AB (ref >60.00)
Glucose, Bld: 99 mg/dL (ref 70–99)
POTASSIUM: 4.1 meq/L (ref 3.5–5.1)
Sodium: 141 mEq/L (ref 135–145)
Total Bilirubin: 0.6 mg/dL (ref 0.2–1.2)
Total Protein: 6.8 g/dL (ref 6.0–8.3)

## 2016-03-25 LAB — CBC WITH DIFFERENTIAL/PLATELET
BASOS PCT: 0.8 % (ref 0.0–3.0)
Basophils Absolute: 0.1 10*3/uL (ref 0.0–0.1)
EOS ABS: 0.3 10*3/uL (ref 0.0–0.7)
Eosinophils Relative: 4.2 % (ref 0.0–5.0)
HEMATOCRIT: 40 % (ref 36.0–46.0)
HEMOGLOBIN: 13.2 g/dL (ref 12.0–15.0)
LYMPHS PCT: 29.1 % (ref 12.0–46.0)
Lymphs Abs: 2.3 10*3/uL (ref 0.7–4.0)
MCHC: 33.1 g/dL (ref 30.0–36.0)
MCV: 84.8 fl (ref 78.0–100.0)
Monocytes Absolute: 0.6 10*3/uL (ref 0.1–1.0)
Monocytes Relative: 8 % (ref 3.0–12.0)
Neutro Abs: 4.6 10*3/uL (ref 1.4–7.7)
Neutrophils Relative %: 57.9 % (ref 43.0–77.0)
Platelets: 265 10*3/uL (ref 150.0–400.0)
RBC: 4.71 Mil/uL (ref 3.87–5.11)
RDW: 14.9 % (ref 11.5–15.5)
WBC: 7.9 10*3/uL (ref 4.0–10.5)

## 2016-03-25 LAB — LIPID PANEL
CHOLESTEROL: 152 mg/dL (ref 0–200)
HDL: 49.8 mg/dL (ref >39.00)
LDL CALC: 78 mg/dL (ref 0–99)
NonHDL: 102.63
TRIGLYCERIDES: 122 mg/dL (ref 0.0–149.0)
Total CHOL/HDL Ratio: 3
VLDL: 24.4 mg/dL (ref 0.0–40.0)

## 2016-03-25 LAB — TSH: TSH: 3.93 u[IU]/mL (ref 0.35–4.50)

## 2016-03-25 NOTE — Patient Instructions (Signed)

## 2016-03-25 NOTE — Progress Notes (Signed)
Pre visit review using our clinic review tool, if applicable. No additional management support is needed unless otherwise documented below in the visit note. 

## 2016-03-25 NOTE — Progress Notes (Signed)
Subjective:    Patient ID: Kristen Escobar, female    DOB: 1946/02/22, 70 y.o.   MRN: [id number]  HPI  70 year old patient seen today for a Medicare wellness visit and preventive health examination. Medical problems include impaired glucose tolerance as well as essential hypertension.  She has arthritis obesity and history of chronic kidney disease  She had follow-up colonoscopy October 2017.  This revealed a tubular adenoma Bone density performed September 2017 Follow-up mammogram September 2017  She has a history of carotid bruits in carotid artery ultrasound performed in the spring revealed 40-59% lesions.  Follow-up in one year recommended  Past Medical History:  Diagnosis Date  . Arthritis   . Cancer ([place]) 03/2009   breast- rt  . History of radiation therapy 07/12/10,completed   right breast 60 Gy x30 fx  . Hypertension   . Obesity   . Peripheral vascular disease ([place]) 1995   PT DEVELOPED CIRCULATION PROBLEMS IN BOTH HANDS AND GANGRENE OF BOTH INDEX FINGERS--REQUIRING AMPUTATION OF THE INDEX FINGERS AND VASCULAR SURGERY.  PT TOLD HER PROBLEMS RELATED TO SMOKING.   NO OTHER PROBLEMS SINCE.     Social History   Social History  . Marital status: Married    Spouse name: N/A  . Number of children: N/A  . Years of education: N/A   Occupational History  . Not on file.   Social History Main Topics  . Smoking status: Former Smoker    Packs/day: 1.00    Years: 20.00    Quit date: 04/21/1993  . Smokeless tobacco: Never Used  . Alcohol use Yes     Comment: OCCAS - MAYBE ONCE A MONTH  . Drug use: No  . Sexual activity: Yes   Other Topics Concern  . Not on file   Social History Narrative  . No narrative on file    Past Surgical History:  Procedure Laterality Date  . AMPUTATION     partial amputation of both index fingers  . BREAST SURGERY  2011   lumpectomy with node sampling- RIGHT  . TOTAL HIP ARTHROPLASTY  12/16/2011   right hip  . TOTAL HIP ARTHROPLASTY   01/20/2012   Procedure: TOTAL HIP ARTHROPLASTY ANTERIOR APPROACH;  Surgeon: Mauri Pole, MD;  Location: [facility];  Service: Orthopedics;  Laterality: Left;  . TUBAL LIGATION    . VASCULAR SURGERY     both hands    Family History  Problem Relation Age of Onset  . Cancer Mother     pt unaware of what kind  . Hearing loss Mother   . Coronary artery disease Father   . Diabetes Father   . Coronary artery disease Brother   . Coronary artery disease Brother   . Diabetes Sister   . Cancer Sister     breast  . Colon cancer Neg Hx     No Known Allergies  Current Outpatient Prescriptions on File Prior to Visit  Medication Sig Dispense Refill  . atorvastatin (LIPITOR) 10 MG tablet Take 1 tablet (10 mg total) by mouth daily. 90 tablet 3  . ibuprofen (ADVIL,MOTRIN) 200 MG tablet Take 200 mg by mouth every 6 (six) hours as needed (as needed).    Marland Kitchen levothyroxine (SYNTHROID, LEVOTHROID) 50 MCG tablet TAKE 1 TABLET (50 MCG TOTAL) BY MOUTH DAILY. 90 tablet 1  . losartan-hydrochlorothiazide (HYZAAR) 100-25 MG tablet TAKE 1 TABLET BY MOUTH EVERY DAY 90 tablet 1  . tamoxifen (NOLVADEX) 20 MG tablet Take 1 tablet (20 mg total) by  mouth daily. 90 tablet 3   Current Facility-Administered Medications on File Prior to Visit  Medication Dose Route Frequency Provider Last Rate Last Dose  . 0.9 %  sodium chloride infusion  500 mL Intravenous Continuous Manus Gunning, MD        BP (!) 150/62 (BP Location: Left Arm, Patient Position: Sitting, Cuff Size: Large)   Pulse 63   Temp 97.9 F (36.6 C) (Oral)   Ht 5\' 2"  (1.575 m)   Wt 222 lb 12 oz (101 kg)   SpO2 97%   BMI 40.74 kg/m   Medicare wellness visit:  1. Risk factors, based on past  M,S,F history.  Cardiovascular risk factors include a history of hypertension and dyslipidemia  2.  Physical activities:no exercise limitations; [facility] [employer] [facility] and use some exercise machines  3.  Depression/mood:no history of major depression  or mood disorder  4.  Hearing:no deficits  5.  ADL's:independent  6.  Fall risk:low  7.  Home safety:no problems identified  8.  Height weight, and visual acuity;height and weight stable no change in visual acuity.  Has had bilateral cataract extraction surgery this year  9.  Counseling: continue heart healthy diet modest weight loss.  Encourage more rigorous exercise activities.  Recommended  10. Lab orders based on risk factors:laboratory studies including lipid panel will be reviewed  11. Referral :follow-up oncology in the spring  12. Care plan:will need colonoscopies at five-year intervals.  Due to recent diagnosis of colonic polyps  13. Cognitive assessment: alert and oriented with normal affect no cognitive dysfunction  14. Screening: Patient provided with a written and personalized 5-10 year screening schedule in the AVS.    15. Provider List Update: oncology.  Primary care GI      Review of Systems  Constitutional: Negative.   HENT: Negative for congestion, dental problem, hearing loss, rhinorrhea, sinus pressure, sore throat and tinnitus.   Eyes: Negative for pain, discharge and visual disturbance.  Respiratory: Negative for cough and shortness of breath.   Cardiovascular: Negative for chest pain, palpitations and leg swelling.  Gastrointestinal: Negative for abdominal distention, abdominal pain, blood in stool, constipation, diarrhea, nausea and vomiting.  Genitourinary: Negative for difficulty urinating, dysuria, flank pain, frequency, hematuria, pelvic pain, urgency, vaginal bleeding, vaginal discharge and vaginal pain.  Musculoskeletal: Negative for arthralgias, gait problem and joint swelling.  Skin: Negative for rash.  Neurological: Negative for dizziness, syncope, speech difficulty, weakness, numbness and headaches.  Hematological: Negative for adenopathy.  Psychiatric/Behavioral: Negative for agitation, behavioral problems and dysphoric mood. The patient is  not nervous/anxious.        Objective:   Physical Exam  Constitutional: She is oriented to person, place, and time. She appears well-developed and well-nourished. No distress.  Weight 222 Blood pressure 140/70  HENT:  Head: Normocephalic and atraumatic.  Right Ear: External ear normal.  Left Ear: External ear normal.  Mouth/Throat: Oropharynx is clear and moist.  Edentulous Pharyngeal crowding  Eyes: Conjunctivae and EOM are normal.  Neck: Normal range of motion. Neck supple. No JVD present. No thyromegaly present.  Bilateral bruits right greater than left  Cardiovascular: Normal rate, regular rhythm, normal heart sounds and intact distal pulses.   No murmur heard. Dorsalis pedis pulses trace.  Posterior tibial pulses plus 2  Pulmonary/Chest: Effort normal and breath sounds normal. She has no wheezes. She has no rales.  Lumpectomy right breast.  Axilla clear  Abdominal: Soft. Bowel sounds are normal. She exhibits no  distension and no mass. There is no tenderness. There is no rebound and no guarding.  Genitourinary: Vagina normal.  Musculoskeletal: Normal range of motion. She exhibits no edema or tenderness.  Neurological: She is alert and oriented to person, place, and time. She has normal reflexes. No cranial nerve deficit. She exhibits normal muscle tone. Coordination normal.  Skin: Skin is warm and dry. No rash noted.  Psychiatric: She has a normal mood and affect. Her behavior is normal.          Assessment & Plan:   Essential hypertension Hypothyroidism.  We'll check follow-up TSH Dyslipidemia.  Review a lipid profile History right breast cancer.  Follow-up oncology Medicare wellness visit   Return here 6 months  Jaxsun Ciampi Pilar Plate

## 2016-03-28 ENCOUNTER — Other Ambulatory Visit: Payer: Self-pay

## 2016-05-30 DIAGNOSIS — M25561 Pain in right knee: Secondary | ICD-10-CM | POA: Diagnosis not present

## 2016-05-30 DIAGNOSIS — G8929 Other chronic pain: Secondary | ICD-10-CM | POA: Diagnosis not present

## 2016-05-30 DIAGNOSIS — M1711 Unilateral primary osteoarthritis, right knee: Secondary | ICD-10-CM | POA: Diagnosis not present

## 2016-06-05 DIAGNOSIS — D2312 Other benign neoplasm of skin of left eyelid, including canthus: Secondary | ICD-10-CM | POA: Diagnosis not present

## 2016-06-05 DIAGNOSIS — D2311 Other benign neoplasm of skin of right eyelid, including canthus: Secondary | ICD-10-CM | POA: Diagnosis not present

## 2016-06-11 DIAGNOSIS — D2311 Other benign neoplasm of skin of right eyelid, including canthus: Secondary | ICD-10-CM | POA: Diagnosis not present

## 2016-06-11 DIAGNOSIS — D2312 Other benign neoplasm of skin of left eyelid, including canthus: Secondary | ICD-10-CM | POA: Diagnosis not present

## 2016-06-29 ENCOUNTER — Telehealth: Payer: Self-pay

## 2016-06-29 NOTE — Telephone Encounter (Signed)
Spoke with patient and she is aware of her new schedule  Kristen Escobar

## 2016-07-06 ENCOUNTER — Other Ambulatory Visit: Payer: Self-pay | Admitting: Internal Medicine

## 2016-07-23 ENCOUNTER — Ambulatory Visit: Payer: Medicare Other | Admitting: Hematology and Oncology

## 2016-08-04 ENCOUNTER — Encounter: Payer: Self-pay | Admitting: Hematology and Oncology

## 2016-08-04 ENCOUNTER — Ambulatory Visit (HOSPITAL_BASED_OUTPATIENT_CLINIC_OR_DEPARTMENT_OTHER): Payer: Medicare Other | Admitting: Hematology and Oncology

## 2016-08-04 DIAGNOSIS — Z170 Estrogen receptor positive status [ER+]: Secondary | ICD-10-CM | POA: Diagnosis not present

## 2016-08-04 DIAGNOSIS — C50411 Malignant neoplasm of upper-outer quadrant of right female breast: Secondary | ICD-10-CM | POA: Diagnosis not present

## 2016-08-04 MED ORDER — TAMOXIFEN CITRATE 20 MG PO TABS: 20.0000 mg | ORAL_TABLET | Freq: Every day | ORAL | 3 refills | Status: DC

## 2016-08-04 NOTE — Assessment & Plan Note (Signed)
Right breast invasive lobular cancer T1, N0, M0 stage IA status post neoadjuvant tamoxifen followed by lumpectomy 03/28/2010 and radiation followed by tamoxifen therapy started 11/25/2010.   Tamoxifen toxicities: She is tolerating tamoxifen extremely well without any major problems or concerns. Plan is to continue tamoxifen for 10 years. She will make an appointment with a gynecologist to get Pap tests annually. This is required because she is on tamoxifen.  Radiation recall: She used to get periodic radiation recall reactions of the breast. She has had a total of 4 episodes until now. Each episode lasts 1-2 days associated with redness and low-grade temperature and it goes away spontaneously. Previous biopsies with dermatology have been negative.  Surveillance:  1. Mammogram 12/28/15: No evidence of malignancy, breast density category B  2. Breast exam 08/04/2016 is normal except for the tissue density associated with the site of prior surgery and radiation.   Survivorship: Patient was instructed that she needs to have annual GYN exams while on tamoxifen. I discussed the importance of exercise and eating more fruits and vegetables and less red meat.  Return to clinic in 1 year for follow-up

## 2016-08-04 NOTE — Progress Notes (Signed)
Patient Care Team: Marletta Lor, MD as PCP - General Marcy Panning, MD as Consulting Physician (Internal Medicine) Thea Silversmith, MD (Inactive) as Consulting Physician (Radiation Oncology)  DIAGNOSIS:  Encounter Diagnosis  Name Primary?  . Malignant neoplasm of upper-outer quadrant of right breast in female, estrogen receptor positive ([place])     SUMMARY OF ONCOLOGIC HISTORY:   Breast cancer of upper-outer quadrant of right female breast ([place])   04/22/2009 - 03/27/2010 Anti-estrogen oral therapy    Neoadjuvant antiestrogen therapy with tamoxifen for 10 months      03/28/2010 Surgery    Right breast lumpectomy invasive ductal-lobular carcinoma intermediate grade 0.5 cm, 4 SLN negative, reexcision margins clear: ER 95% PR 95% HER-2 negative Ki-67 8%      06/03/2010 - 07/12/2010 Radiation Therapy    Radiation therapy to lumpectomy site ? Radiation recall related to tamoxifen      11/25/2010 -  Anti-estrogen oral therapy    Aromasin 25 mg daily.myalgias and arthralgias stop November 2012 went back to tamoxifen 20 mg daily       CHIEF COMPLIANT: Tolerating tamoxifen extremely well  INTERVAL HISTORY: Kristen Escobar is a 71 year old with above-mentioned history of right breast cancer currently on tamoxifen therapy. She is tolerating it extremely well. She denies any significant hot flashes or myalgias. She is staying very active and exercises 5 days a week at the [facility] water aerobics as well as goals and bicycle rides as well.  REVIEW OF SYSTEMS:   Constitutional: Denies fevers, chills or abnormal weight loss Eyes: Denies blurriness of vision Ears, nose, mouth, throat, and face: Denies mucositis or sore throat Respiratory: Denies cough, dyspnea or wheezes Cardiovascular: Denies palpitation, chest discomfort Gastrointestinal:  Denies nausea, heartburn or change in bowel habits Skin: Denies abnormal skin rashes Lymphatics: Denies new lymphadenopathy or easy  bruising Neurological:Denies numbness, tingling or new weaknesses Behavioral/Psych: Mood is stable, no new changes  Extremities: No lower extremity edema Breast:  denies any pain or lumps or nodules All other systems were reviewed with the patient and are negative.  I have reviewed the past medical history, past surgical history, social history and family history with the patient and they are unchanged from previous note.  ALLERGIES:  has No Known Allergies.  MEDICATIONS:  Current Outpatient Prescriptions  Medication Sig Dispense Refill  . atorvastatin (LIPITOR) 10 MG tablet Take 1 tablet (10 mg total) by mouth daily. 90 tablet 3  . ibuprofen (ADVIL,MOTRIN) 200 MG tablet Take 200 mg by mouth every 6 (six) hours as needed (as needed).    Marland Kitchen levothyroxine (SYNTHROID, LEVOTHROID) 50 MCG tablet TAKE 1 TABLET (50 MCG TOTAL) BY MOUTH DAILY. 90 tablet 1  . losartan-hydrochlorothiazide (HYZAAR) 100-25 MG tablet TAKE 1 TABLET BY MOUTH EVERY DAY 90 tablet 1  . metroNIDAZOLE (METROGEL) 0.75 % gel     . tamoxifen (NOLVADEX) 20 MG tablet Take 1 tablet (20 mg total) by mouth daily. 90 tablet 3   Current Facility-Administered Medications  Medication Dose Route Frequency Provider Last Rate Last Dose  . 0.9 %  sodium chloride infusion  500 mL Intravenous Continuous Manus Gunning, MD        PHYSICAL EXAMINATION: ECOG PERFORMANCE STATUS: 1 - Symptomatic but completely ambulatory  There were no vitals filed for this visit. There were no vitals filed for this visit.  GENERAL:alert, no distress and comfortable SKIN: skin color, texture, turgor are normal, no rashes or significant lesions EYES: normal, Conjunctiva are pink and non-injected, sclera clear OROPHARYNX:no exudate,  no erythema and lips, buccal mucosa, and tongue normal  NECK: supple, thyroid normal size, non-tender, without nodularity LYMPH:  no palpable lymphadenopathy in the cervical, axillary or inguinal LUNGS: clear to  auscultation and percussion with normal breathing effort HEART: regular rate & rhythm and no murmurs and no lower extremity edema ABDOMEN:abdomen soft, non-tender and normal bowel sounds MUSCULOSKELETAL:no cyanosis of digits and no clubbing  NEURO: alert & oriented x 3 with fluent speech, no focal motor/sensory deficits EXTREMITIES: No lower extremity edema BREAST: No palpable masses or nodules . No palpable axillary supraclavicular or infraclavicular adenopathy no breast tenderness or nipple discharge. (exam performed in the presence of a chaperone)  LABORATORY DATA:  I have reviewed the data as listed   Chemistry      Component Value Date/Time   NA 141 03/25/2016 1019   NA 140 01/18/2014 1029   K 4.1 03/25/2016 1019   K 4.0 01/18/2014 1029   CL 106 03/25/2016 1019   CL 105 03/10/2012 0859   CO2 25 03/25/2016 1019   CO2 27 01/18/2014 1029   BUN 19 03/25/2016 1019   BUN 15.8 01/18/2014 1029   CREATININE 1.22 (H) 03/25/2016 1019   CREATININE 1.2 (H) 01/18/2014 1029      Component Value Date/Time   CALCIUM 9.0 03/25/2016 1019   CALCIUM 9.1 01/18/2014 1029   ALKPHOS 47 03/25/2016 1019   ALKPHOS 50 01/18/2014 1029   AST 12 03/25/2016 1019   AST 14 01/18/2014 1029   ALT 11 03/25/2016 1019   ALT 10 01/18/2014 1029   BILITOT 0.6 03/25/2016 1019   BILITOT 0.37 01/18/2014 1029       Lab Results  Component Value Date   WBC 7.9 03/25/2016   HGB 13.2 03/25/2016   HCT 40.0 03/25/2016   MCV 84.8 03/25/2016   PLT 265.0 03/25/2016   NEUTROABS 4.6 03/25/2016    ASSESSMENT & PLAN:  Breast cancer of upper-outer quadrant of right female breast Right breast invasive lobular cancer T1, N0, M0 stage IA status post neoadjuvant tamoxifen followed by lumpectomy 03/28/2010 and radiation followed by tamoxifen therapy started 11/25/2010.   Tamoxifen toxicities: She is tolerating tamoxifen extremely well without any major problems or concerns. Plan is to continue tamoxifen for 10 years. She  will make an appointment with a gynecologist to get Pap tests annually. This is required because she is on tamoxifen.  Radiation recall: She used to get periodic radiation recall reactions of the breast. She has had a total of 4 episodes until now. Each episode lasts 1-2 days associated with redness and low-grade temperature and it goes away spontaneously. Previous biopsies with dermatology have been negative.  Surveillance:  1. Mammogram 12/28/15: No evidence of malignancy, breast density category B  2. Breast exam 08/04/2016 is normal except for the tissue density associated with the site of prior surgery and radiation.   Survivorship: Patient was instructed that she needs to have annual GYN exams while on tamoxifen. She is going for [employer] 5 days a week to do water aerobics and then bicycles with her husband.  Return to clinic in 1 year for follow-up  I spent 25 minutes talking to the patient of which more than half was spent in counseling and coordination of care.  No orders of the defined types were placed in this encounter.  The patient has a good understanding of the overall plan. she agrees with it. she will call with any problems that may develop before the next visit here.  Rulon Eisenmenger, MD 08/04/16

## 2016-08-12 ENCOUNTER — Telehealth: Payer: Self-pay | Admitting: Internal Medicine

## 2016-08-12 MED ORDER — LEVOTHYROXINE SODIUM 50 MCG PO TABS: 50.0000 ug | ORAL_TABLET | Freq: Every day | ORAL | 1 refills | Status: DC

## 2016-08-12 NOTE — Telephone Encounter (Signed)
Medication refilled

## 2016-08-12 NOTE — Telephone Encounter (Signed)
° ° °  Pt request refill of the following:   levothyroxine (SYNTHROID, LEVOTHROID) 50 MCG tablet  Phamacy:  [facility]

## 2016-09-01 ENCOUNTER — Encounter: Payer: Self-pay | Admitting: Family Medicine

## 2016-09-01 ENCOUNTER — Ambulatory Visit (INDEPENDENT_AMBULATORY_CARE_PROVIDER_SITE_OTHER): Payer: Medicare Other | Admitting: Family Medicine

## 2016-09-01 VITALS — BP 120/60 | HR 84 | Temp 98.0°F | Wt 237.6 lb

## 2016-09-01 DIAGNOSIS — R490 Dysphonia: Secondary | ICD-10-CM

## 2016-09-01 DIAGNOSIS — K219 Gastro-esophageal reflux disease without esophagitis: Secondary | ICD-10-CM

## 2016-09-01 MED ORDER — OMEPRAZOLE 20 MG PO CPDR: 20.0000 mg | DELAYED_RELEASE_CAPSULE | Freq: Every day | ORAL | 3 refills | Status: DC

## 2016-09-01 NOTE — Progress Notes (Signed)
Subjective:    Patient ID: Kristen Escobar, female    DOB: 02/07/1946, 71 y.o.   MRN: [id number]  HPI  Kristen Escobar is a 71 year old female who presents today with a cough that started two days ago.  Cough is nonproductive.  Associated intermittent hoarseness of voice and acid reflux after coffee and evening meal that has been present for 10 days and mild sore throat for 1 day that improved.  She denies fever, chills, sweats, rhinitis, post nasal drip, itchy/watery eyes, nasal congestion, sinus pressure/pain. OTC allergy medication has been used for 4 days.  Reflux is noted by patient that has been present with symptoms.  She reports triggers of episodes after coffee and eating late at night. She denies N/V, dysphagia, melena, hematochezia, or narrow caliber stool. Tums provides excellent benefit. She reports that acid reflux has occurred intermittently over the past 2 months. She denies chest pain, palpitations, N/V, SOB, jaw pain, arm pain, and diaphoresis.   Review of Systems  Constitutional: Negative for chills, fatigue and fever.  HENT: Positive for sore throat and voice change. Negative for congestion, nosebleeds, postnasal drip, rhinorrhea, sinus pain, sinus pressure, sneezing and trouble swallowing.   Respiratory: Positive for cough. Negative for shortness of breath and wheezing.   Cardiovascular: Negative for chest pain and palpitations.  Gastrointestinal: Negative for diarrhea, nausea and vomiting.  Musculoskeletal: Negative for arthralgias.  Neurological: Negative for dizziness, light-headedness, numbness and headaches.  Psychiatric/Behavioral:       Denies depressed or anxious mood   Past Medical History:  Diagnosis Date  . Arthritis   . 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 History of radiation therapy 07/12/10,completed   right breast 60 Gy x30 fx  . Hypertension   . Obesity   . Peripheral vascular disease ([place]) 1995   PT DEVELOPED CIRCULATION PROBLEMS IN BOTH HANDS AND  GANGRENE OF BOTH INDEX FINGERS--REQUIRING AMPUTATION OF THE INDEX FINGERS AND VASCULAR SURGERY.  PT TOLD HER PROBLEMS RELATED TO SMOKING.   NO OTHER PROBLEMS SINCE.     Social History   Social History  . Marital status: Married    Spouse name: N/A  . Number of children: N/A  . Years of education: N/A   Occupational History  . Not on file.   Social History Main Topics  . Smoking status: Former Smoker    Packs/day: 1.00    Years: 20.00    Quit date: 04/21/1993  . Smokeless tobacco: Never Used  . Alcohol use Yes     Comment: OCCAS - MAYBE ONCE A MONTH  . Drug use: No  . Sexual activity: Yes   Other Topics Concern  . Not on file   Social History Narrative  . No narrative on file    Past Surgical History:  Procedure Laterality Date  . AMPUTATION     partial amputation of both index fingers  . BREAST SURGERY  2011   lumpectomy with node sampling- RIGHT  . TOTAL HIP ARTHROPLASTY  12/16/2011   right hip  . TOTAL HIP ARTHROPLASTY  01/20/2012   Procedure: TOTAL HIP ARTHROPLASTY ANTERIOR APPROACH;  Surgeon: Mauri Pole, MD;  Location: [facility];  Service: Orthopedics;  Laterality: Left;  . TUBAL LIGATION    . VASCULAR SURGERY     both hands    Family History  Problem Relation Age of Onset  . Cancer Mother        pt unaware of what kind  . Hearing loss Mother   .  Coronary artery disease Father   . Diabetes Father   . Coronary artery disease Brother   . Coronary artery disease Brother   . Diabetes Sister   . Cancer Sister        breast  . Colon cancer Neg Hx     No Known Allergies  Current Outpatient Prescriptions on File Prior to Visit  Medication Sig Dispense Refill  . atorvastatin (LIPITOR) 10 MG tablet Take 1 tablet (10 mg total) by mouth daily. 90 tablet 3  . ibuprofen (ADVIL,MOTRIN) 200 MG tablet Take 200 mg by mouth every 6 (six) hours as needed (as needed).    Marland Kitchen levothyroxine (SYNTHROID, LEVOTHROID) 50 MCG tablet Take 1 tablet (50 mcg total) by mouth daily.  90 tablet 1  . losartan-hydrochlorothiazide (HYZAAR) 100-25 MG tablet TAKE 1 TABLET BY MOUTH EVERY DAY 90 tablet 1  . metroNIDAZOLE (METROGEL) 0.75 % gel     . tamoxifen (NOLVADEX) 20 MG tablet Take 1 tablet (20 mg total) by mouth daily. 90 tablet 3   Current Facility-Administered Medications on File Prior to Visit  Medication Dose Route Frequency Provider Last Rate Last Dose  . 0.9 %  sodium chloride infusion  500 mL Intravenous Continuous Armbruster, Renelda Loma, MD        BP 120/60 (BP Location: Left Arm, Patient Position: Sitting, Cuff Size: Large)   Pulse 84   Temp 98 F (36.7 C) (Oral)   Wt 237 lb 9.6 oz (107.8 kg)   SpO2 98%   BMI 43.46 kg/m       Objective:   Physical Exam  Constitutional: She is oriented to person, place, and time. She appears well-developed and well-nourished.  Morbidly obese  HENT:  Right Ear: Tympanic membrane normal.  Left Ear: Tympanic membrane normal.  Nose: No rhinorrhea. Right sinus exhibits no maxillary sinus tenderness and no frontal sinus tenderness. Left sinus exhibits no maxillary sinus tenderness and no frontal sinus tenderness.  Mouth/Throat: Mucous membranes are normal. No oropharyngeal exudate or posterior oropharyngeal erythema.  Eyes: Pupils are equal, round, and reactive to light. No scleral icterus.  Neck: Neck supple.  Cardiovascular: Normal rate, regular rhythm and intact distal pulses.   Pulmonary/Chest: Effort normal and breath sounds normal. She has no wheezes. She has no rales.  Abdominal: Soft. Bowel sounds are normal. There is no tenderness.  Lymphadenopathy:    She has no cervical adenopathy.  Neurological: She is alert and oriented to person, place, and time.  Skin: Skin is warm and dry. No rash noted.  Psychiatric: She has a normal mood and affect. Her behavior is normal. Judgment and thought content normal.      Assessment & Plan:   1. Gastroesophageal reflux disease, esophagitis presence not specified History and  exam support trial of omeprazole for symptoms. We discussed that this medication is recommended for use of 8 weeks and if extended will be decided by provider after evaluation of symptoms and treatment effectiveness. Provided list of food choices for GERD and also avoidance of triggers. Follow up with provider is scheduled in one month. - omeprazole (PRILOSEC) 20 MG capsule; Take 1 capsule (20 mg total) by mouth daily.  Dispense: 30 capsule; Refill: 3  2. Hoarseness of voice Suspect that GERD vs. Allergic rhinitis symptoms. Exam and history support treatment for GERD.  She will continue OTC allergy medication for symptoms of allergic rhinitis if needed.  Follow up in one month with provider or sooner if needed.  Delano Metz, FNP-C

## 2016-09-01 NOTE — Patient Instructions (Signed)
It was a pleasure to see you today! Please continue use of over the counter allergy medication and try the medication for reflux for 8 weeks.  Also, please see food choices to avoid triggers for reflux. Follow up with Dr. Burnice Logan as scheduled.   Food Choices for Gastroesophageal Reflux Disease, Adult When you have gastroesophageal reflux disease (GERD), the foods you eat and your eating habits are very important. Choosing the right foods can help ease your discomfort. What guidelines do I need to follow?  Choose fruits, vegetables, whole grains, and low-fat dairy products.  Choose low-fat meat, fish, and poultry.  Limit fats such as oils, salad dressings, butter, nuts, and avocado.  Keep a food diary. This helps you identify foods that cause symptoms.  Avoid foods that cause symptoms. These may be different for everyone.  Eat small meals often instead of 3 large meals a day.  Eat your meals slowly, in a place where you are relaxed.  Limit fried foods.  Cook foods using methods other than frying.  Avoid drinking alcohol.  Avoid drinking large amounts of liquids with your meals.  Avoid bending over or lying down until 2-3 hours after eating. What foods are not recommended? These are some foods and drinks that may make your symptoms worse: Vegetables  Tomatoes. Tomato juice. Tomato and spaghetti sauce. Chili peppers. Onion and garlic. Horseradish. Fruits  Oranges, grapefruit, and lemon (fruit and juice). Meats  High-fat meats, fish, and poultry. This includes hot dogs, ribs, ham, sausage, salami, and bacon. Dairy  Whole milk and chocolate milk. Sour cream. Cream. Butter. Ice cream. Cream cheese. Drinks  Coffee and tea. Bubbly (carbonated) drinks or energy drinks. Condiments  Hot sauce. Barbecue sauce. Sweets/Desserts  Chocolate and cocoa. Donuts. Peppermint and spearmint. Fats and Oils  High-fat foods. This includes [place] fries and potato chips. Other  Vinegar.  Strong spices. This includes black pepper, white pepper, red pepper, cayenne, curry powder, cloves, ginger, and chili powder. The items listed above may not be a complete list of foods and drinks to avoid. Contact your dietitian for more information.  This information is not intended to replace advice given to you by your health care provider. Make sure you discuss any questions you have with your health care provider. Document Released: 10/07/2011 Document Revised: 09/13/2015 Document Reviewed: 02/09/2013 Elsevier Interactive Patient Education  2017 [employer].

## 2016-09-06 ENCOUNTER — Other Ambulatory Visit: Payer: Self-pay | Admitting: Internal Medicine

## 2016-09-10 ENCOUNTER — Ambulatory Visit (INDEPENDENT_AMBULATORY_CARE_PROVIDER_SITE_OTHER): Payer: Medicare Other | Admitting: Family Medicine

## 2016-09-10 ENCOUNTER — Encounter: Payer: Self-pay | Admitting: Family Medicine

## 2016-09-10 VITALS — BP 130/80 | HR 66 | Temp 98.5°F | Wt 236.1 lb

## 2016-09-10 DIAGNOSIS — K219 Gastro-esophageal reflux disease without esophagitis: Secondary | ICD-10-CM

## 2016-09-10 DIAGNOSIS — R490 Dysphonia: Secondary | ICD-10-CM | POA: Diagnosis not present

## 2016-09-10 NOTE — Progress Notes (Signed)
Subjective:     Patient ID: Kristen Escobar, female   DOB: [birth year], 71 y.o.   MRN: [id number]  HPI Patient seen recently with hoarseness and had significant reflux symptoms. She was started on omeprazole 20 mg daily and her GERD symptoms are significant improved. Her hoarseness has persisted. Onset of hoarseness around  May 4. Initiation omeprazole around May 14. Patient is an ex- smoker. Quit 1995. 20 pack year history. No recent postnasal drip symptoms. No chronic sinusitis symptoms. No appetite or weight changes. No cough.  Past Medical History:  Diagnosis Date  . Arthritis   . 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 History of radiation therapy 07/12/10,completed   right breast 60 Gy x30 fx  . Hypertension   . Obesity   . Peripheral vascular disease ([place]) 1995   PT DEVELOPED CIRCULATION PROBLEMS IN BOTH HANDS AND GANGRENE OF BOTH INDEX FINGERS--REQUIRING AMPUTATION OF THE INDEX FINGERS AND VASCULAR SURGERY.  PT TOLD HER PROBLEMS RELATED TO SMOKING.   NO OTHER PROBLEMS SINCE.   Past Surgical History:  Procedure Laterality Date  . AMPUTATION     partial amputation of both index fingers  . BREAST SURGERY  2011   lumpectomy with node sampling- RIGHT  . TOTAL HIP ARTHROPLASTY  12/16/2011   right hip  . TOTAL HIP ARTHROPLASTY  01/20/2012   Procedure: TOTAL HIP ARTHROPLASTY ANTERIOR APPROACH;  Surgeon: Mauri Pole, MD;  Location: [facility];  Service: Orthopedics;  Laterality: Left;  . TUBAL LIGATION    . VASCULAR SURGERY     both hands    reports that she quit smoking about 23 years ago. She has a 20.00 pack-year smoking history. She has never used smokeless tobacco. She reports that she drinks alcohol. She reports that she does not use drugs. family history includes Cancer in her mother and sister; Coronary artery disease in her brother, brother, and father; Diabetes in her father and sister; Hearing loss in her mother. No Known Allergies   Review of Systems  Constitutional: Negative  for chills and fever.  HENT: Positive for voice change. Negative for congestion, postnasal drip and sore throat.   Respiratory: Negative for cough.        Objective:   Physical Exam  Constitutional: She appears well-developed and well-nourished.  HENT:  Mouth/Throat: Oropharynx is clear and moist.  Neck: Neck supple.  Cardiovascular: Normal rate and regular rhythm.   Pulmonary/Chest: Effort normal and breath sounds normal. No respiratory distress. She has no wheezes. She has no rales.  Lymphadenopathy:    She has no cervical adenopathy.       Assessment:     #1 GERD significant improved on omeprazole 20 mg daily  #2 hoarseness-possibly related to #1. We explained may take several weeks for hoarseness to resolve with treatment or reflux    Plan:     -Continue omeprazole -She has follow-up with her primary in 2 weeks and if symptoms not fully resolved at time with hoarseness consider ENT referral for consideration of nasolaryngoscopy  Eulas Post MD [facility]

## 2016-09-10 NOTE — Patient Instructions (Signed)
Hoarseness Hoarseness is any abnormal change in your voice.Hoarseness can make it difficult to speak. Your voice may sound raspy, breathy, or strained. Hoarseness is caused by a problem with the vocal cords. The vocal cords are two bands of tissue inside your voice box (larynx). When you speak, your vocal cords move back and forth to create sound. The surfaces of your vocal cords need to be smooth for your voice to sound clear. Swelling or lumps on the vocal cords can cause hoarseness. Common causes of vocal cord problems include:  Upper airway infection.  A long-term cough.  Straining or overusing your voice.  Smoking.  Allergies.  Vocal cord growths.  Stomach acids that flow up from your stomach and irritate your vocal cords (gastroesophageal reflux). Follow these instructions at home: Watch your condition for any changes. To ease any discomfort that you feel:  Rest your voice. Do not whisper. Whispering can cause muscle strain.  Do not speak in a loud or harsh voice that makes your hoarseness worse.  Do not use any tobacco products, including cigarettes, chewing tobacco, or electronic cigarettes. If you need help quitting, ask your health care provider.  Avoid secondhand smoke.  Do not eat foods that give you heartburn. Heartburn can make gastroesophageal reflux worse.  Do not drink coffee.  Do not drink alcohol.  Drink enough fluids to keep your urine clear or pale yellow.  Use a humidifier if the air in your home is dry. Contact a health care provider if:  You have hoarseness that lasts longer than 3 weeks.  You almost lose or completelylose your voice for longer than 3 days.  You have pain when you swallow or try to talk.  You feel a lump in your neck. Get help right away if:  You have trouble swallowing.  You feel as though you are choking when you swallow.  You cough up blood or vomit blood.  You have trouble breathing. This information is not  intended to replace advice given to you by your health care provider. Make sure you discuss any questions you have with your health care provider. Document Released: 03/21/2005 Document Revised: 09/13/2015 Document Reviewed: 03/29/2014 Elsevier Interactive Patient Education  2017 [employer] with daily Prilosec  If hoarseness not better in 2 weeks let Dr Raliegh Ip know and may need ENT referral for further evaluation.

## 2016-09-19 ENCOUNTER — Ambulatory Visit (INDEPENDENT_AMBULATORY_CARE_PROVIDER_SITE_OTHER): Payer: Medicare Other | Admitting: Internal Medicine

## 2016-09-19 ENCOUNTER — Encounter: Payer: Self-pay | Admitting: Internal Medicine

## 2016-09-19 VITALS — BP 122/64 | HR 72 | Temp 98.8°F | Wt 235.6 lb

## 2016-09-19 DIAGNOSIS — M159 Polyosteoarthritis, unspecified: Secondary | ICD-10-CM

## 2016-09-19 DIAGNOSIS — R490 Dysphonia: Secondary | ICD-10-CM | POA: Diagnosis not present

## 2016-09-19 DIAGNOSIS — M150 Primary generalized (osteo)arthritis: Secondary | ICD-10-CM

## 2016-09-19 DIAGNOSIS — R7302 Impaired glucose tolerance (oral): Secondary | ICD-10-CM

## 2016-09-19 DIAGNOSIS — I10 Essential (primary) hypertension: Secondary | ICD-10-CM | POA: Diagnosis not present

## 2016-09-19 NOTE — Patient Instructions (Addendum)
WE NOW OFFER   [facility] FAST TRACK!!!  SAME DAY Appointments for ACUTE CARE  Such as: Sprains, Injuries, cuts, abrasions, rashes, muscle pain, joint pain, back pain Colds, flu, sore throats, headache, allergies, cough, fever  Ear pain, sinus and eye infections Abdominal pain, nausea, vomiting, diarrhea, upset stomach Animal/insect bites  3 Easy Ways to Schedule: Walk-In Scheduling Call in scheduling Mychart Sign-up: [email or web address]  Return in 6 months for your annual exam  Call if worsens persist after another 2-3 weeks for ENT referral

## 2016-09-19 NOTE — Progress Notes (Signed)
Subjective:    Patient ID: Kristen Escobar, female    DOB: 08/31/1945, 71 y.o.   MRN: [id number]  HPI  71 year old patient who is seen today in follow-up. The patient awoke with a hoarseness approximately one month ago. She was placed on PPI therapy for possible reflux as an an aggravating cause of the hoarseness.  Her reflux symptoms have nicely healed, but there has been no improvement with her hoarseness The hoarseness tends to worsen throughout the day with use She has just started a regimen of a nonsedating antihistamine; she denies rhinorrhea or cough. She does have remote history of tobacco use but discontinued in 1995 after a 20 year history of tobacco use   Past Medical History:  Diagnosis Date  . Arthritis   . 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 History of radiation therapy 07/12/10,completed   right breast 60 Gy x30 fx  . Hypertension   . Obesity   . Peripheral vascular disease ([place]) 1995   PT DEVELOPED CIRCULATION PROBLEMS IN BOTH HANDS AND GANGRENE OF BOTH INDEX FINGERS--REQUIRING AMPUTATION OF THE INDEX FINGERS AND VASCULAR SURGERY.  PT TOLD HER PROBLEMS RELATED TO SMOKING.   NO OTHER PROBLEMS SINCE.     Social History   Social History  . Marital status: Married    Spouse name: N/A  . Number of children: N/A  . Years of education: N/A   Occupational History  . Not on file.   Social History Main Topics  . Smoking status: Former Smoker    Packs/day: 1.00    Years: 20.00    Quit date: 04/21/1993  . Smokeless tobacco: Never Used  . Alcohol use Yes     Comment: OCCAS - MAYBE ONCE A MONTH  . Drug use: No  . Sexual activity: Yes   Other Topics Concern  . Not on file   Social History Narrative  . No narrative on file    Past Surgical History:  Procedure Laterality Date  . AMPUTATION     partial amputation of both index fingers  . BREAST SURGERY  2011   lumpectomy with node sampling- RIGHT  . TOTAL HIP ARTHROPLASTY  12/16/2011   right hip  . TOTAL HIP  ARTHROPLASTY  01/20/2012   Procedure: TOTAL HIP ARTHROPLASTY ANTERIOR APPROACH;  Surgeon: Mauri Pole, MD;  Location: [facility];  Service: Orthopedics;  Laterality: Left;  . TUBAL LIGATION    . VASCULAR SURGERY     both hands    Family History  Problem Relation Age of Onset  . Cancer Mother        pt unaware of what kind  . Hearing loss Mother   . Coronary artery disease Father   . Diabetes Father   . Coronary artery disease Brother   . Coronary artery disease Brother   . Diabetes Sister   . Cancer Sister        breast  . Colon cancer Neg Hx     No Known Allergies  Current Outpatient Prescriptions on File Prior to Visit  Medication Sig Dispense Refill  . atorvastatin (LIPITOR) 10 MG tablet Take 1 tablet (10 mg total) by mouth daily. 90 tablet 3  . ibuprofen (ADVIL,MOTRIN) 200 MG tablet Take 200 mg by mouth every 6 (six) hours as needed (as needed).    Marland Kitchen levothyroxine (SYNTHROID, LEVOTHROID) 50 MCG tablet TAKE 1 TABLET BY MOUTH DAILY 90 tablet 1  . losartan-hydrochlorothiazide (HYZAAR) 100-25 MG tablet TAKE 1 TABLET BY MOUTH EVERY  DAY 90 tablet 1  . metroNIDAZOLE (METROGEL) 0.75 % gel     . omeprazole (PRILOSEC) 20 MG capsule Take 1 capsule (20 mg total) by mouth daily. 30 capsule 3  . tamoxifen (NOLVADEX) 20 MG tablet Take 1 tablet (20 mg total) by mouth daily. 90 tablet 3   Current Facility-Administered Medications on File Prior to Visit  Medication Dose Route Frequency Provider Last Rate Last Dose  . 0.9 %  sodium chloride infusion  500 mL Intravenous Continuous Armbruster, Renelda Loma, MD        BP 122/64 (BP Location: Left Arm, Patient Position: Sitting, Cuff Size: Large)   Pulse 72   Temp 98.8 F (37.1 C) (Oral)   Wt 235 lb 9.6 oz (106.9 kg)   SpO2 98%   BMI 43.09 kg/m     Review of Systems  Constitutional: Negative.   HENT: Positive for voice change. Negative for congestion, dental problem, hearing loss, rhinorrhea, sinus pressure, sore throat and tinnitus.     Eyes: Negative for pain, discharge and visual disturbance.  Respiratory: Negative for cough and shortness of breath.   Cardiovascular: Negative for chest pain, palpitations and leg swelling.  Gastrointestinal: Negative for abdominal distention, abdominal pain, blood in stool, constipation, diarrhea, nausea and vomiting.  Genitourinary: Negative for difficulty urinating, dysuria, flank pain, frequency, hematuria, pelvic pain, urgency, vaginal bleeding, vaginal discharge and vaginal pain.  Musculoskeletal: Negative for arthralgias, gait problem and joint swelling.  Skin: Negative for rash.  Neurological: Negative for dizziness, syncope, speech difficulty, weakness, numbness and headaches.  Hematological: Negative for adenopathy.  Psychiatric/Behavioral: Negative for agitation, behavioral problems and dysphoric mood. The patient is not nervous/anxious.        Objective:   Physical Exam  Constitutional: She is oriented to person, place, and time. She appears well-developed and well-nourished.  HENT:  Head: Normocephalic.  Right Ear: External ear normal.  Left Ear: External ear normal.  Mouth/Throat: Oropharynx is clear and moist.  Eyes: Conjunctivae and EOM are normal. Pupils are equal, round, and reactive to light.  Neck: Normal range of motion. Neck supple. No thyromegaly present.  Cardiovascular: Normal rate, regular rhythm, normal heart sounds and intact distal pulses.   Pulmonary/Chest: Effort normal and breath sounds normal. No respiratory distress. She has no wheezes. She has no rales.  Abdominal: Soft. Bowel sounds are normal. She exhibits no mass. There is no tenderness.  Musculoskeletal: Normal range of motion.  Lymphadenopathy:    She has no cervical adenopathy.  Neurological: She is alert and oriented to person, place, and time.  Skin: Skin is warm and dry. No rash noted.  Psychiatric: She has a normal mood and affect. Her behavior is normal.          Assessment &  Plan:   Hoarseness.  This has been present for approximately 4 weeks. We'll continue antireflux regimen.  The patient has started a regimen of a nonsedating antihistamine.  We'll continue to treat as above and observe.  ENT referral if no improvement  Nyoka Cowden

## 2016-09-26 ENCOUNTER — Ambulatory Visit: Payer: Medicare Other | Admitting: Internal Medicine

## 2016-09-29 ENCOUNTER — Ambulatory Visit: Payer: Medicare Other | Admitting: Internal Medicine

## 2016-10-06 ENCOUNTER — Other Ambulatory Visit: Payer: Self-pay | Admitting: Internal Medicine

## 2016-10-10 ENCOUNTER — Telehealth: Payer: Self-pay | Admitting: Internal Medicine

## 2016-10-10 DIAGNOSIS — R490 Dysphonia: Secondary | ICD-10-CM

## 2016-10-10 NOTE — Telephone Encounter (Signed)
Please advise 

## 2016-10-10 NOTE — Telephone Encounter (Signed)
ENT referral for hoarseness

## 2016-10-10 NOTE — Telephone Encounter (Signed)
Pt would like to proceed with a specialist for her voice it has not gotten any better since 5/14 or 5/23 and the best days for her would be Monday or Tuesday for the specialist.

## 2016-10-13 NOTE — Telephone Encounter (Signed)
referral order placed

## 2016-11-18 DIAGNOSIS — J3800 Paralysis of vocal cords and larynx, unspecified: Secondary | ICD-10-CM | POA: Insufficient documentation

## 2016-11-19 ENCOUNTER — Other Ambulatory Visit: Payer: Self-pay | Admitting: Otolaryngology

## 2016-11-19 DIAGNOSIS — J3800 Paralysis of vocal cords and larynx, unspecified: Secondary | ICD-10-CM

## 2016-11-21 ENCOUNTER — Ambulatory Visit
Admission: RE | Admit: 2016-11-21 | Discharge: 2016-11-21 | Disposition: A | Discharge status: Home / Self Care | Payer: Medicare Other | Source: Ambulatory Visit | Attending: Otolaryngology | Admitting: Otolaryngology

## 2016-11-21 DIAGNOSIS — J3800 Paralysis of vocal cords and larynx, unspecified: Secondary | ICD-10-CM

## 2016-11-21 DIAGNOSIS — R221 Localized swelling, mass and lump, neck: Secondary | ICD-10-CM | POA: Diagnosis not present

## 2016-11-21 MED ORDER — IOPAMIDOL (ISOVUE-300) INJECTION 61%: 100.0000 mL | Freq: Once | INTRAVENOUS | Status: DC | PRN

## 2016-11-21 MED ORDER — IOPAMIDOL (ISOVUE-300) INJECTION 61%
75.0000 mL | Freq: Once | INTRAVENOUS | Status: AC | PRN
  Administered 2016-11-21: 75 mL via INTRAVENOUS

## 2016-11-27 ENCOUNTER — Encounter: Payer: Self-pay | Admitting: Hematology and Oncology

## 2016-11-27 ENCOUNTER — Telehealth: Payer: Self-pay

## 2016-11-27 ENCOUNTER — Ambulatory Visit (HOSPITAL_BASED_OUTPATIENT_CLINIC_OR_DEPARTMENT_OTHER): Payer: Medicare Other | Admitting: Hematology and Oncology

## 2016-11-27 ENCOUNTER — Other Ambulatory Visit (HOSPITAL_COMMUNITY): Payer: Self-pay | Admitting: Otolaryngology

## 2016-11-27 VITALS — BP 160/62 | HR 77 | Temp 98.2°F | Resp 18 | Ht 62.0 in | Wt 236.6 lb

## 2016-11-27 DIAGNOSIS — Z170 Estrogen receptor positive status [ER+]: Secondary | ICD-10-CM | POA: Diagnosis not present

## 2016-11-27 DIAGNOSIS — K229 Disease of esophagus, unspecified: Secondary | ICD-10-CM

## 2016-11-27 DIAGNOSIS — C50411 Malignant neoplasm of upper-outer quadrant of right female breast: Secondary | ICD-10-CM

## 2016-11-27 DIAGNOSIS — R222 Localized swelling, mass and lump, trunk: Secondary | ICD-10-CM

## 2016-11-27 DIAGNOSIS — K228 Other specified diseases of esophagus: Secondary | ICD-10-CM | POA: Insufficient documentation

## 2016-11-27 DIAGNOSIS — K2289 Other specified disease of esophagus: Secondary | ICD-10-CM

## 2016-11-27 NOTE — Progress Notes (Signed)
Patient Care Team: Marletta Lor, MD as PCP - General Marcy Panning, MD as Consulting Physician (Internal Medicine) Thea Silversmith, MD (Inactive) as Consulting Physician (Radiation Oncology)  DIAGNOSIS:  Encounter Diagnoses  Name Primary?  . Malignant neoplasm of upper-outer quadrant of right breast in female, estrogen receptor positive ([place]) Yes  . 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 Radiation recall related to tamoxifen      11/25/2010 -  Anti-estrogen oral therapy    Aromasin 25 mg daily.myalgias and arthralgias stop November 2012 went back to tamoxifen 20 mg daily       CHIEF COMPLIANT: Follow-up after recent CT scan showing esophageal/thyroid mass with bilateral cervical and mediastinal lymph nodes  INTERVAL HISTORY: Kristen Escobar is a 71 year old with above-mentioned history of right breast cancer who is currently on tamoxifen therapy. She had hoarseness of voice started in May 2018. She was treated for reflux esophagitis. She did not get better so she was referred to ENT. ENT performed a CT of her neck on 11/21/2016. The CT scan showed a mass that was involving the esophagus and thyroid as well. There was bilateral cervical lymphadenopathy in addition to mediastinal lymph nodes. Patient and her family called with a lot of anxiety and to be seen by [place] urgently. She is here today accompanied by her husband as well as her son and daughter-in-law.  REVIEW OF SYSTEMS:   Constitutional: Denies  fevers, chills or abnormal weight loss Eyes: Denies blurriness of vision Ears, nose, mouth, throat, and face: Hoarseness of voice Respiratory: Denies cough, dyspnea or wheezes Cardiovascular: Denies palpitation, chest discomfort Gastrointestinal:  Denies nausea, heartburn or change in bowel habits Skin: Denies abnormal skin rashes Lymphatics: Denies new lymphadenopathy or easy bruising Neurological:Denies numbness, tingling or new weaknesses Behavioral/Psych: Mood is stable, no new changes  Extremities: No lower extremity edema All other systems were reviewed with the patient and are negative.  I have reviewed the past medical history, past surgical history, social history and family history with the patient and they are unchanged from previous note.  ALLERGIES:  has No Known Allergies.  MEDICATIONS:  Current Outpatient Prescriptions  Medication Sig Dispense Refill  . atorvastatin (LIPITOR) 10 MG tablet TAKE 1 TABLET EVERY DAY 90 tablet 3  . ibuprofen (ADVIL,MOTRIN) 200 MG tablet Take 200 mg by mouth every 6 (six) hours as needed (as needed).    Marland Kitchen levothyroxine (SYNTHROID, LEVOTHROID) 50 MCG tablet TAKE 1 TABLET BY MOUTH DAILY 90 tablet 1  . losartan-hydrochlorothiazide (HYZAAR) 100-25 MG tablet TAKE 1 TABLET BY MOUTH EVERY DAY 90 tablet 1  . metroNIDAZOLE (METROGEL) 0.75 % gel     . omeprazole (PRILOSEC) 20 MG capsule Take 1 capsule (20 mg total) by mouth daily. 30 capsule 3  . tamoxifen (NOLVADEX) 20 MG tablet Take 1 tablet (20 mg total) by mouth daily. 90 tablet 3   Current Facility-Administered Medications  Medication Dose Route Frequency Provider Last Rate Last Dose  . 0.9 %  sodium chloride infusion  500 mL Intravenous Continuous Armbruster, Renelda Loma, MD        PHYSICAL EXAMINATION: ECOG PERFORMANCE STATUS: 1 - Symptomatic but completely ambulatory  Vitals:   11/27/16 1339  BP: (!) 160/62  Pulse: 77  Resp: 18  Temp: 98.2 F (36.8 C)  SpO2: 97%   Filed Weights    11/27/16 1339  Weight: 236 lb 9.6 oz (107.3 kg)    GENERAL:alert, no distress and comfortable SKIN: skin color, texture, turgor are normal, no rashes or significant lesions EYES: normal, Conjunctiva are pink and non-injected, sclera clear OROPHARYNX:no exudate, no erythema and lips, buccal mucosa, and tongue normal  NECK: supple, thyroid normal size, non-tender, without nodularity LYMPH:  Palpable cervical lymph nodes. Cervical lymph nodes are mostly palpable on the left side. LUNGS: clear to auscultation and percussion with normal breathing effort HEART: regular rate & rhythm and no murmurs and no lower extremity edema ABDOMEN:abdomen soft, non-tender and normal bowel sounds MUSCULOSKELETAL:no cyanosis of digits and no clubbing  NEURO: alert & oriented x 3 with fluent speech, no focal motor/sensory deficits EXTREMITIES: No lower extremity edema   LABORATORY DATA:  I have reviewed the data as listed   Chemistry      Component Value Date/Time   NA 141 03/25/2016 1019   NA 140 01/18/2014 1029   K 4.1 03/25/2016 1019   K 4.0 01/18/2014 1029   CL 106 03/25/2016 1019   CL 105 03/10/2012 0859   CO2 25 03/25/2016 1019   CO2 27 01/18/2014 1029   BUN 19 03/25/2016 1019   BUN 15.8 01/18/2014 1029   CREATININE 1.22 (H) 03/25/2016 1019   CREATININE 1.2 (H) 01/18/2014 1029      Component Value Date/Time   CALCIUM 9.0 03/25/2016 1019   CALCIUM 9.1 01/18/2014 1029   ALKPHOS 47 03/25/2016 1019   ALKPHOS 50 01/18/2014 1029   AST 12 03/25/2016 1019   AST 14 01/18/2014 1029   ALT 11 03/25/2016 1019   ALT 10 01/18/2014 1029   BILITOT 0.6 03/25/2016 1019   BILITOT 0.37 01/18/2014 1029       Lab Results  Component Value Date   WBC 7.9 03/25/2016   HGB 13.2 03/25/2016   HCT 40.0 03/25/2016   MCV 84.8 03/25/2016   PLT 265.0 03/25/2016   NEUTROABS 4.6 03/25/2016    ASSESSMENT & PLAN:  Esophageal mass Patient had vocal cord paralysis and a CT neck was performed 11/21/2016 which  revealed invasive mass in the lower left neck arising from either the esophagus of the left lobe of the thyroid. Extensive regional lymph node metastasis was noted both in the right and left including upper mediastinum. Recurrent laryngeal nerve on the left also be affected  Recommendation: Ultrasound-guided biopsy of the tumor PET/CT scan Follow-up after both of these to discuss the treatment plan.  If the final path comes back as esophageal cancer, then I would recommend that she see Dr. Learta Codding. If it comes back as thyroid cancer, then she would need to see endocrinology.  We will follow-up on these biopsies and PET/CT scans.   I spent 25 minutes talking to the patient of which more than half was spent in counseling and coordination of care.  Orders Placed This Encounter  Procedures  . NM PET Image Initial (PI) Skull Base To Thigh    Standing Status:   Future    Standing Expiration Date:   11/27/2017    Order Specific Question:   If indicated for the ordered procedure, I authorize  the administration of a radiopharmaceutical per Radiology protocol    Answer:   Yes    Order Specific Question:   Preferred imaging location?    Answer:   [name]    Order Specific Question:   Radiology Contrast Protocol - do NOT remove file path    Answer:   [email or web address]    Order Specific Question:   Reason for Exam additional comments    Answer:   Esophageal and thyroid masses with lymphadenopathy   The patient has a good understanding of the overall plan. she agrees with it. she will call with any problems that may develop before the next visit here.   Rulon Eisenmenger, MD 11/27/16

## 2016-11-27 NOTE — Assessment & Plan Note (Addendum)
Patient had vocal cord paralysis and a CT neck was performed 11/21/2016 which revealed invasive mass in the lower left neck arising from either the esophagus of the left lobe of the thyroid. Extensive regional lymph node metastasis was noted both in the right and left including upper mediastinum. Recurrent laryngeal nerve on the left also be affected  Recommendation: Ultrasound-guided biopsy of the tumor PET/CT scan Follow-up after both of these to discuss the treatment plan.  If the final path comes back as esophageal cancer, then I would recommend that she see Dr. Learta Codding. If it comes back as thyroid cancer, then she would need to see endocrinology.  We will follow-up on these biopsies and PET/CT scans.

## 2016-11-27 NOTE — Telephone Encounter (Signed)
Received call from pt husband stating that they would like to see Dr.Gudena or speak with him regarding a CT scan that pt had completed recently. Ct results showing some lesions and currently waiting on US biopsy. Pt states that she has vocal cord paralysis for a few months now and is very upset about finding out about her CT. Notified and discussed pt issue and would like to see pt in the office today. Set an appt for this afternoon for pt to come in. Pt confirmed time/date today.

## 2016-11-28 ENCOUNTER — Other Ambulatory Visit: Payer: Self-pay | Admitting: General Surgery

## 2016-11-28 ENCOUNTER — Other Ambulatory Visit: Payer: Self-pay | Admitting: Physician Assistant

## 2016-11-30 ENCOUNTER — Other Ambulatory Visit: Payer: Self-pay | Admitting: Radiology

## 2016-12-01 ENCOUNTER — Ambulatory Visit (HOSPITAL_COMMUNITY)
Admission: RE | Admit: 2016-12-01 | Discharge: 2016-12-01 | Disposition: A | Discharge status: Home / Self Care | Payer: Medicare Other | Source: Ambulatory Visit | Attending: Otolaryngology | Admitting: Otolaryngology

## 2016-12-01 ENCOUNTER — Encounter (HOSPITAL_COMMUNITY): Payer: Self-pay

## 2016-12-01 DIAGNOSIS — Z87891 Personal history of nicotine dependence: Secondary | ICD-10-CM | POA: Diagnosis not present

## 2016-12-01 DIAGNOSIS — Z853 Personal history of malignant neoplasm of breast: Secondary | ICD-10-CM | POA: Diagnosis not present

## 2016-12-01 DIAGNOSIS — I739 Peripheral vascular disease, unspecified: Secondary | ICD-10-CM | POA: Diagnosis not present

## 2016-12-01 DIAGNOSIS — Z79810 Long term (current) use of selective estrogen receptor modulators (SERMs): Secondary | ICD-10-CM | POA: Diagnosis not present

## 2016-12-01 DIAGNOSIS — Z96643 Presence of artificial hip joint, bilateral: Secondary | ICD-10-CM | POA: Insufficient documentation

## 2016-12-01 DIAGNOSIS — Z6841 Body Mass Index (BMI) 40.0 and over, adult: Secondary | ICD-10-CM | POA: Diagnosis not present

## 2016-12-01 DIAGNOSIS — R590 Localized enlarged lymph nodes: Secondary | ICD-10-CM | POA: Diagnosis not present

## 2016-12-01 DIAGNOSIS — L040 Acute lymphadenitis of face, head and neck: Secondary | ICD-10-CM | POA: Diagnosis not present

## 2016-12-01 DIAGNOSIS — R490 Dysphonia: Secondary | ICD-10-CM | POA: Insufficient documentation

## 2016-12-01 DIAGNOSIS — I129 Hypertensive chronic kidney disease with stage 1 through stage 4 chronic kidney disease, or unspecified chronic kidney disease: Secondary | ICD-10-CM | POA: Diagnosis not present

## 2016-12-01 DIAGNOSIS — E039 Hypothyroidism, unspecified: Secondary | ICD-10-CM | POA: Diagnosis not present

## 2016-12-01 DIAGNOSIS — K210 Gastro-esophageal reflux disease with esophagitis: Secondary | ICD-10-CM | POA: Insufficient documentation

## 2016-12-01 DIAGNOSIS — N189 Chronic kidney disease, unspecified: Secondary | ICD-10-CM | POA: Insufficient documentation

## 2016-12-01 DIAGNOSIS — R222 Localized swelling, mass and lump, trunk: Secondary | ICD-10-CM | POA: Insufficient documentation

## 2016-12-01 DIAGNOSIS — Z79899 Other long term (current) drug therapy: Secondary | ICD-10-CM | POA: Insufficient documentation

## 2016-12-01 DIAGNOSIS — Z923 Personal history of irradiation: Secondary | ICD-10-CM | POA: Insufficient documentation

## 2016-12-01 DIAGNOSIS — E669 Obesity, unspecified: Secondary | ICD-10-CM | POA: Diagnosis not present

## 2016-12-01 LAB — CBC
HEMATOCRIT: 38.3 % (ref 36.0–46.0)
Hemoglobin: 13 g/dL (ref 12.0–15.0)
MCH: 28.7 pg (ref 26.0–34.0)
MCHC: 33.9 g/dL (ref 30.0–36.0)
MCV: 84.5 fL (ref 78.0–100.0)
Platelets: 244 10*3/uL (ref 150–400)
RBC: 4.53 MIL/uL (ref 3.87–5.11)
RDW: 14 % (ref 11.5–15.5)
WBC: 9.6 10*3/uL (ref 4.0–10.5)

## 2016-12-01 LAB — PROTIME-INR
INR: 0.94
PROTHROMBIN TIME: 12.6 s (ref 11.4–15.2)

## 2016-12-01 LAB — APTT: APTT: 25 s (ref 24–36)

## 2016-12-01 MED ORDER — NALOXONE HCL 0.4 MG/ML IJ SOLN
INTRAMUSCULAR | Status: AC
  Filled 2016-12-01: qty 1

## 2016-12-01 MED ORDER — FENTANYL CITRATE (PF) 100 MCG/2ML IJ SOLN
INTRAMUSCULAR | Status: AC
  Filled 2016-12-01: qty 4

## 2016-12-01 MED ORDER — SODIUM CHLORIDE 0.9 % IV SOLN
INTRAVENOUS | Status: DC
  Administered 2016-12-01: 09:00:00 via INTRAVENOUS

## 2016-12-01 MED ORDER — FENTANYL CITRATE (PF) 100 MCG/2ML IJ SOLN
INTRAMUSCULAR | Status: AC | PRN
  Administered 2016-12-01 (×2): 50 ug via INTRAVENOUS

## 2016-12-01 MED ORDER — FLUMAZENIL 0.5 MG/5ML IV SOLN
INTRAVENOUS | Status: AC
  Filled 2016-12-01: qty 5

## 2016-12-01 MED ORDER — MIDAZOLAM HCL 2 MG/2ML IJ SOLN
INTRAMUSCULAR | Status: AC | PRN
  Administered 2016-12-01 (×2): 1 mg via INTRAVENOUS

## 2016-12-01 MED ORDER — MIDAZOLAM HCL 2 MG/2ML IJ SOLN
INTRAMUSCULAR | Status: AC
  Filled 2016-12-01: qty 4

## 2016-12-01 NOTE — Sedation Documentation (Signed)
Patient denies pain and is resting comfortably.  

## 2016-12-01 NOTE — Consult Note (Signed)
Chief Complaint: Patient was seen in consultation today for US guided left neck mass biopsy  Referring Physician(s): Rosen,Jefry/Gudena,V  Supervising Physician: Sandi Mariscal  Patient Status: [facility] - Out-pt  History of Present Illness: Kristen Escobar is a 72 y.o. female ,remote smoker, with history of right breast cancer in 2011, currently on tamoxifen therapy. She had hoarseness of voice which started in May 2018. She was treated for reflux esophagitis. She did not get better so she was referred to ENT. ENT performed a CT of her neck on 11/21/2016 which revealed:  Invasive mass lesion in the low left neck which either arises from the esophagus or the left lobe of the thyroid. Both of those structures appear to be involved. Extensive regional nodal metastatic disease, more pronounced on the left than the right, including involvement of the upper mediastinum. Certainly, recurrent laryngeal nerve on the left could be affected. 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   She presents today for US guided left neck mass biopsy for further evaluation.  Past Medical History:  Diagnosis Date  . Arthritis   . Cancer ([place]) 03/2009   breast- rt  . History of radiation therapy 07/12/10,completed   right breast 60 Gy x30 fx  . Hypertension   . Obesity   . Peripheral vascular disease ([place]) 1995   PT DEVELOPED CIRCULATION PROBLEMS IN BOTH HANDS AND GANGRENE OF BOTH INDEX FINGERS--REQUIRING AMPUTATION OF THE INDEX FINGERS AND VASCULAR SURGERY.  PT TOLD HER PROBLEMS RELATED TO SMOKING.   NO OTHER PROBLEMS SINCE.    Past Surgical History:  Procedure Laterality Date  . AMPUTATION     partial amputation of both index fingers  . BREAST SURGERY  2011   lumpectomy with node sampling- RIGHT  . TOTAL HIP ARTHROPLASTY  12/16/2011   right hip  . TOTAL HIP ARTHROPLASTY  01/20/2012   Procedure: TOTAL HIP ARTHROPLASTY ANTERIOR APPROACH;  Surgeon: Mauri Pole, MD;  Location: [facility] ORS;  Service:  Orthopedics;  Laterality: Left;  . TUBAL LIGATION    . VASCULAR SURGERY     both hands    Allergies: Patient has no known allergies.  Medications: Prior to Admission medications   Medication Sig Start Date End Date Taking? Authorizing Provider  atorvastatin (LIPITOR) 10 MG tablet TAKE 1 TABLET EVERY DAY 10/06/16  Yes Marletta Lor, MD  ibuprofen (ADVIL,MOTRIN) 200 MG tablet Take 200 mg by mouth every 6 (six) hours as needed (as needed).   Yes [provider]  levothyroxine (SYNTHROID, LEVOTHROID) 50 MCG tablet TAKE 1 TABLET BY MOUTH DAILY 09/08/16  Yes Marletta Lor, MD  losartan-hydrochlorothiazide [facility]) 100-25 MG tablet TAKE 1 TABLET BY MOUTH EVERY DAY 07/07/16  Yes Marletta Lor, MD  omeprazole (PRILOSEC) 20 MG capsule Take 1 capsule (20 mg total) by mouth daily. 09/01/16  Yes Kordsmeier, Gregary Signs, FNP  tamoxifen (NOLVADEX) 20 MG tablet Take 1 tablet (20 mg total) by mouth daily. 08/04/16  Yes Nicholas Lose, MD  metroNIDAZOLE (METROGEL) 0.75 % gel  02/07/16   [provider]     Family History  Problem Relation Age of Onset  . Cancer Mother        pt unaware of what kind  . Hearing loss Mother   . Coronary artery disease Father   . Diabetes Father   . Coronary artery disease Brother   . Coronary artery disease Brother   . Diabetes Sister   . Cancer Sister  breast  . Colon cancer Neg Hx     Social History   Social History  . Marital status: Married    Spouse name: N/A  . Number of children: N/A  . Years of education: N/A   Social History Main Topics  . Smoking status: Former Smoker    Packs/day: 1.00    Years: 20.00    Quit date: 04/21/1993  . Smokeless tobacco: Never Used  . Alcohol use Yes     Comment: OCCAS - MAYBE ONCE A MONTH  . Drug use: No  . Sexual activity: Yes   Other Topics Concern  . None   Social History Narrative  . None      Review of Systems see above; denies fever, HA, CP, dyspnea, cough, abd/back  pain,N/V or bleeding.   Vital Signs: BP (!) 168/73   Pulse 80   Temp 98.1 F (36.7 C) (Oral)   Resp 20   Ht 5\' 3"  (1.6 m)   Wt 237 lb (107.5 kg)   SpO2 97%   BMI 41.98 kg/m   Physical Exam awake, alert. Chest with few insp wheezes, no crackles ;heart with regular rate and rhythm.  abd obese, soft,+BS,NT; no LE edema; palpable cervical lymph nodes, larger on left  Mallampati Score:     Imaging: Ct Soft Tissue Neck W Contrast  Result Date: 11/21/2016 CLINICAL DATA:  Left vocal cord paralysis. Smoking history. History of right breast cancer treated with radiation. EXAM: CT NECK WITH CONTRAST TECHNIQUE: Multidetector CT imaging of the neck was performed using the standard protocol following the bolus administration of intravenous contrast. CONTRAST:  73mL ISOVUE-300 IOPAMIDOL (ISOVUE-300) INJECTION 61% COMPARISON:  None. FINDINGS: Pharynx and larynx: Nasopharynx and oropharynx appear normal. The patient does show a left vocal fold paresis with the vocal fold near the midline and dilatation of the ipsilateral piriform sinus. There is a 4.4 x 2.1 cm mass lesion chest inferior to the cricoid cartilage which appears to represent an invasive cancer. This is present in the midline and extending towards the left. It insinuates itself between the carotid artery and the airway. I think this either could arise from the esophagus or the left lobe of the thyroid. Both of those structures appear to be involved by this locally invasive mass. Salivary glands: Parotid and submandibular glands are normal. Thyroid: As noted above, the right lobe of the thyroid is normal. The left lobe of the thyroid is abnormal, involved by a mass lesion that either represents a thyroid primary carcinoma or a locally invasive esophageal carcinoma. Lymph nodes: Bilateral lymphadenopathy. In the right neck, there are abnormal level 2 and 3 nodes, the largest being a level 3 node with short axis diameter of 11 mm, extending over a  length of 2.4 cm. On the left, the largest node is a level 4 node showing a short axis diameter of 16 mm and extending over a length of 3.4 cm. There are numerous other enlarged lymph nodes in the low left neck and the superior mediastinum (level 6), left more than right. These nodes are certainly involved by metastatic disease. Vascular: Left carotid artery is opposed to the left neck mass but not completely surrounded. Left jugular vein is compressed but does not appear to be thrombosed. Limited intracranial: Negative Visualized orbits: Negative Mastoids and visualized paranasal sinuses: Negative Skeleton: Ordinary mild spondylosis. Upper chest: Emphysema.  No focal lesion. Other: None significant IMPRESSION: Invasive mass lesion in the low left neck which either arises from the esophagus  or the left lobe of the thyroid. Both of those structures appear to be involved. Extensive regional nodal metastatic disease, more pronounced on the left than the right, including involvement of the upper mediastinum. Certainly, recurrent laryngeal nerve on the left could be affected. 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 Electronically Signed   By: Nelson Chimes M.D.   On: 11/21/2016 12:50    Labs:  CBC:  Recent Labs  03/25/16 1019 12/01/16 0823  WBC 7.9 9.6  HGB 13.2 13.0  HCT 40.0 38.3  PLT 265.0 244    COAGS:  Recent Labs  12/01/16 0823  INR 0.94  APTT 25    BMP:  Recent Labs  03/25/16 1019  NA 141  K 4.1  CL 106  CO2 25  GLUCOSE 99  BUN 19  CALCIUM 9.0  CREATININE 1.22*    LIVER FUNCTION TESTS:  Recent Labs  03/25/16 1019  BILITOT 0.6  AST 12  ALT 11  ALKPHOS 47  PROT 6.8  ALBUMIN 3.7    TUMOR MARKERS: No results for input(s): AFPTM, CEA, CA199, CHROMGRNA in the last 8760 hours.  Assessment and Plan: 71 y.o. female ,remote smoker, with history of right breast cancer in 2011, currently on tamoxifen therapy. She had hoarseness of voice which started in May 2018.  She was treated for reflux esophagitis. She did not get better so she was referred to ENT. ENT performed a CT of her neck on 11/21/2016 which revealed:  Invasive mass lesion in the low left neck which either arises from the esophagus or the left lobe of the thyroid. Both of those structures appear to be involved. Extensive regional nodal metastatic disease, more pronounced on the left than the right, including involvement of the upper mediastinum. Certainly, recurrent laryngeal nerve on the left could be affected. 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   She presents today for US guided left neck mass biopsy for further evaluation.Risks and benefits discussed with the patient/family including, but not limited to bleeding, infection, damage to adjacent structures or low yield requiring additional tests.All of the patient's questions were answered, patient is agreeable to proceed.Consent signed and in chart.     Thank you for this interesting consult.  I greatly enjoyed meeting Kristen Escobar and look forward to participating in their care.  A copy of this report was sent to the requesting provider on this date.  Electronically Signed: D. Rowe [name], PA-C 12/01/2016, 9:11 AM   I spent a total of 25 minutes   in face to face in clinical consultation, greater than 50% of which was counseling/coordinating care for ultrasound-guided left neck mass biopsy

## 2016-12-01 NOTE — Discharge Instructions (Signed)

## 2016-12-01 NOTE — Procedures (Signed)
Pre Procedure Dx: Left cervical Lymphadenopathy Post Procedural Dx: Same  Technically successful US guided biopsy of dominant left cervical lymph node.  EBL: None  No immediate complications.   Ronny Bacon, MD Pager #: [phone]

## 2016-12-08 ENCOUNTER — Ambulatory Visit (HOSPITAL_COMMUNITY)
Admission: RE | Admit: 2016-12-08 | Discharge: 2016-12-08 | Disposition: A | Discharge status: Home / Self Care | Payer: Medicare Other | Source: Ambulatory Visit | Attending: Hematology and Oncology | Admitting: Hematology and Oncology

## 2016-12-08 DIAGNOSIS — C770 Secondary and unspecified malignant neoplasm of lymph nodes of head, face and neck: Secondary | ICD-10-CM | POA: Insufficient documentation

## 2016-12-08 DIAGNOSIS — K8020 Calculus of gallbladder without cholecystitis without obstruction: Secondary | ICD-10-CM | POA: Diagnosis not present

## 2016-12-08 DIAGNOSIS — I700 Atherosclerosis of aorta: Secondary | ICD-10-CM | POA: Insufficient documentation

## 2016-12-08 DIAGNOSIS — E042 Nontoxic multinodular goiter: Secondary | ICD-10-CM | POA: Insufficient documentation

## 2016-12-08 DIAGNOSIS — K2289 Other specified disease of esophagus: Secondary | ICD-10-CM

## 2016-12-08 DIAGNOSIS — K229 Disease of esophagus, unspecified: Secondary | ICD-10-CM | POA: Diagnosis not present

## 2016-12-08 DIAGNOSIS — C159 Malignant neoplasm of esophagus, unspecified: Secondary | ICD-10-CM | POA: Diagnosis not present

## 2016-12-08 DIAGNOSIS — J439 Emphysema, unspecified: Secondary | ICD-10-CM | POA: Diagnosis not present

## 2016-12-08 DIAGNOSIS — K228 Other specified diseases of esophagus: Secondary | ICD-10-CM

## 2016-12-08 LAB — GLUCOSE, CAPILLARY: GLUCOSE-CAPILLARY: 96 mg/dL (ref 65–99)

## 2016-12-08 MED ORDER — FLUDEOXYGLUCOSE F - 18 (FDG) INJECTION
11.8000 | Freq: Once | INTRAVENOUS | Status: AC | PRN
  Administered 2016-12-08: 11.8 via INTRAVENOUS

## 2016-12-09 ENCOUNTER — Other Ambulatory Visit: Payer: Self-pay

## 2016-12-09 ENCOUNTER — Telehealth: Payer: Self-pay

## 2016-12-09 ENCOUNTER — Telehealth: Payer: Self-pay | Admitting: Hematology and Oncology

## 2016-12-09 DIAGNOSIS — K228 Other specified diseases of esophagus: Secondary | ICD-10-CM

## 2016-12-09 DIAGNOSIS — K2289 Other specified disease of esophagus: Secondary | ICD-10-CM

## 2016-12-09 NOTE — Telephone Encounter (Signed)
-----   Message from Manus Gunning, MD sent at 12/09/2016  9:25 AM EDT ----- Almyra Free can you help coordinate EGD for this patient at 1030 tomorrow? I just had an opening. She has an esophageal mass and needs an EGD ASAP. thanks   ----- Message ----- From: Irven [place] Sent: 12/09/2016   9:09 AM To: Manus Gunning, MD  Please call Dr. Lindi Adie at [facility]. Cell # [phone]

## 2016-12-09 NOTE — Telephone Encounter (Signed)
Spoke to patient, she is scheduled for tomorrow (8/22) at 10:30, aware of prep. She will have her husband with her to bring her home. Advised to take blood pressure med and synthroid by 06:00 am.

## 2016-12-09 NOTE — Telephone Encounter (Signed)
I called the patient to discuss the lymph node biopsy was nondiagnostic but the PET/CT scan showed esophageal masses significantly hypermetabolic along with mediastinal and supraclavicular/cervical lymph nodes. I will call Dr. Havery Moros for an urgent endoscopy and biopsy. I sent a message to Dr. Learta Codding to see if he would be willing to see the patient if it turns out to be esophageal cancer. Patient's husband is very anxious and wants to get these tests done ASAP. We will do our best to expedite all of these appointments.

## 2016-12-10 ENCOUNTER — Ambulatory Visit (AMBULATORY_SURGERY_CENTER): Payer: Medicare Other | Admitting: Gastroenterology

## 2016-12-10 ENCOUNTER — Encounter: Payer: Self-pay | Admitting: Gastroenterology

## 2016-12-10 VITALS — BP 125/51 | HR 55 | Temp 98.0°F | Resp 13 | Ht 62.0 in | Wt 236.0 lb

## 2016-12-10 DIAGNOSIS — K2289 Other specified disease of esophagus: Secondary | ICD-10-CM

## 2016-12-10 DIAGNOSIS — R933 Abnormal findings on diagnostic imaging of other parts of digestive tract: Secondary | ICD-10-CM

## 2016-12-10 DIAGNOSIS — K229 Disease of esophagus, unspecified: Secondary | ICD-10-CM

## 2016-12-10 DIAGNOSIS — K209 Esophagitis, unspecified: Secondary | ICD-10-CM | POA: Diagnosis not present

## 2016-12-10 DIAGNOSIS — K228 Other specified diseases of esophagus: Secondary | ICD-10-CM

## 2016-12-10 MED ORDER — SODIUM CHLORIDE 0.9 % IV SOLN: 500.0000 mL | INTRAVENOUS | Status: DC

## 2016-12-10 NOTE — Op Note (Signed)
[facility] Patient Name: Kristen Escobar Procedure Date: 12/10/2016 10:44 AM MRN: [id number] Endoscopist: Remo Lipps P.  MD, MD Age: 71 Referring MD:  Date of Birth: 02-Dec-1945 Gender: Female Account #: [id number] Procedure:                Upper GI endoscopy Indications:              Abnormal PET scan of the GI tract - esophageal mass Medicines:                Monitored Anesthesia Care Procedure:                Pre-Anesthesia Assessment:                           - Prior to the procedure, a History and Physical                            was performed, and patient medications and                            allergies were reviewed. [name] tolerance of                            previous anesthesia was also reviewed. The risks                            and benefits of the procedure and the sedation                            options and risks were discussed with the patient.                            All questions were answered, and informed consent                            was obtained. Prior Anticoagulants: The patient has                            taken no previous anticoagulant or antiplatelet                            agents. ASA Grade Assessment: II - A patient with                            mild systemic disease. After reviewing the risks                            and benefits, the patient was deemed in                            satisfactory condition to undergo the procedure.                           After obtaining informed consent, the endoscope was  passed under direct vision. Throughout the                            procedure, the patient's blood pressure, pulse, and                            oxygen saturations were monitored continuously. The                            Model GIF-HQ190 [id number]) scope was introduced                            through the mouth, and advanced to the second part         of duodenum. The upper GI endoscopy was                            accomplished without difficulty. The patient                            tolerated the procedure well. Scope In: Scope Out: Findings:                 Esophagogastric landmarks were identified: the                            Z-line was found at 33 cm, the gastroesophageal                            junction was found at 33 cm and the upper extent of                            the gastric folds was found at 35 cm from the                            incisors.                           A 2 cm hiatal hernia was present.                           A fungating mass was found in the upper third of                            the esophagus, roughly 12-15 cm from the incisors.                            The mass was non-obstructing and partially                            circumferential (involving one-half of the lumen                            circumference). It was hard to palpation with the  forceps. Mulitple biopsies were taken with a cold                            forceps for histology.                           The exam of the esophagus was otherwise normal.                           The entire examined stomach was normal.                           The duodenal bulb and second portion of the                            duodenum were normal. Complications:            No immediate complications. Estimated blood loss:                            Minimal. Estimated Blood Loss:     Estimated blood loss was minimal. Impression:               - Esophagogastric landmarks identified.                           - 2 cm hiatal hernia.                           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 Biopsied.                           - Normal stomach.                           - Normal duodenal bulb and second portion of the                             duodenum. Recommendation:           - Patient has a contact number available for                            emergencies. The signs and symptoms of potential                            delayed complications were discussed with the                            patient. Return to normal activities tomorrow.                            Written discharge instructions were provided to the  patient.                           - Resume previous diet.                           - Continue present medications.                           - Await pathology results - sent rush, hopefully                            back in 48 hours.                           - Will discuss results with Dr. Langston Masker when                            available to start appropriate therapy as soon as                            possible Remo Lipps P.  MD, MD 12/10/2016 11:10:42 AM This report has been signed electronically.

## 2016-12-10 NOTE — Progress Notes (Signed)
A/ox3 pleased with MAC, report to Karen RN 

## 2016-12-10 NOTE — Patient Instructions (Signed)
AWAIT BIOPSY RESULTS.   YOU HAD AN ENDOSCOPIC PROCEDURE TODAY AT [facility]:   Refer to the procedure report that was given to you for any specific questions about what was found during the examination.  If the procedure report does not answer your questions, please call your gastroenterologist to clarify.  If you requested that your care partner not be given the details of your procedure findings, then the procedure report has been included in a sealed envelope for you to review at your convenience later.  YOU SHOULD EXPECT: Some feelings of bloating in the abdomen. Passage of more gas than usual.  Walking can help get rid of the air that was put into your GI tract during the procedure and reduce the bloating. If you had a lower endoscopy (such as a colonoscopy or flexible sigmoidoscopy) you may notice spotting of blood in your stool or on the toilet paper. If you underwent a bowel prep for your procedure, you may not have a normal bowel movement for a few days.  Please Note:  You might notice some irritation and congestion in your nose or some drainage.  This is from the oxygen used during your procedure.  There is no need for concern and it should clear up in a day or so.  SYMPTOMS TO REPORT IMMEDIATELY:    Following upper endoscopy (EGD)  Vomiting of blood or coffee ground material  New chest pain or pain under the shoulder blades  Painful or persistently difficult swallowing  New shortness of breath  Fever of 100F or higher  Black, tarry-looking stools  For urgent or emergent issues, a gastroenterologist can be reached at any hour by calling [phone].   DIET:  We do recommend a small meal at first, but then you may proceed to your regular diet.  Drink plenty of fluids but you should avoid alcoholic beverages for 24 hours.  ACTIVITY:  You should plan to take it easy for the rest of today and you should NOT DRIVE or use heavy machinery until tomorrow (because of  the sedation medicines used during the test).    FOLLOW UP: Our staff will call the number listed on your records the next business day following your procedure to check on you and address any questions or concerns that you may have regarding the information given to you following your procedure. If we do not reach you, we will leave a message.  However, if you are feeling well and you are not experiencing any problems, there is no need to return our call.  We will assume that you have returned to your regular daily activities without incident.  If any biopsies were taken you will be contacted by phone or by letter within the next 1-3 weeks.  Please call us at [phone] if you have not heard about the biopsies in 3 weeks.    SIGNATURES/CONFIDENTIALITY: You and/or your care partner have signed paperwork which will be entered into your electronic medical record.  These signatures attest to the fact that that the information above on your After Visit Summary has been reviewed and is understood.  Full responsibility of the confidentiality of this discharge information lies with you and/or your care-partner.

## 2016-12-10 NOTE — Progress Notes (Signed)
Called to room to assist during endoscopic procedure.  Patient ID and intended procedure confirmed with present staff. Received instructions for my participation in the procedure from the performing physician.  

## 2016-12-11 ENCOUNTER — Telehealth: Payer: Self-pay | Admitting: *Deleted

## 2016-12-11 NOTE — Telephone Encounter (Signed)
  Follow up Call-  Call back number 12/10/2016 02/14/2016  Post procedure Call Back phone  # [phone]  Permission to leave phone message Yes Yes  Some recent data might be hidden     Patient questions:  Do you have a fever, pain , or abdominal swelling? Yes.   Pain Score  4 * "Sore throat" after the procedure that is worse than before. Encouraged the patient to gargle with some warm salt water several times today and to call back if no improvement. SM  Have you tolerated food without any problems? Yes.    Have you been able to return to your normal activities? Yes.    Do you have any questions about your discharge instructions: Diet   No. Medications  No. Follow up visit  No.  Do you have questions or concerns about your Care? No.  Actions: * If pain score is 4 or above: No action needed, pain <4.

## 2016-12-12 ENCOUNTER — Other Ambulatory Visit: Payer: Self-pay | Admitting: Hematology and Oncology

## 2016-12-12 ENCOUNTER — Telehealth: Payer: Self-pay | Admitting: Gastroenterology

## 2016-12-12 ENCOUNTER — Telehealth: Payer: Self-pay | Admitting: Hematology and Oncology

## 2016-12-12 DIAGNOSIS — R1310 Dysphagia, unspecified: Secondary | ICD-10-CM

## 2016-12-12 DIAGNOSIS — C50411 Malignant neoplasm of upper-outer quadrant of right female breast: Secondary | ICD-10-CM

## 2016-12-12 DIAGNOSIS — Z170 Estrogen receptor positive status [ER+]: Principal | ICD-10-CM

## 2016-12-12 MED ORDER — SUCRALFATE 1 GM/10ML PO SUSP: 1.0000 g | Freq: Three times a day (TID) | ORAL | 3 refills | Status: DC

## 2016-12-12 NOTE — Telephone Encounter (Signed)
Called patient and son with biopsy results.  Biopsies of the esophagus were nondiagnostic after discussion with Dr. Saralyn Pilar. Several biopsies of this mass were obtained - showing necrotic ulcerated tissue.  Discussed with Dr. Ardis Hughs, EUS will be a difficult exam this high up in the esophagus. I think a repeat EGD is likely low yield given the amount of biopsies I took, likely to be the same result. The supraclavicular lymph node in the left side may be best to either FNA or excisional removal to clarify diagnosis. I discussed this with Dr. Lindi Adie - he will touch base with the patient about coordinating this. She otherwise has some odynophagia since the biopsies, will place her on liquid carafate q 6 hours and see if this helps. If not she can contact us for reassessment. All questions answered.

## 2016-12-12 NOTE — Telephone Encounter (Signed)
I called the patient's family to inform that the esophageal biopsy was nondiagnostic. I placed an order with interventional radiology to biopsy the right supraclavicular lymph node. If done biopsies negative then we would need to decide between excisional lymph node biopsy of the cervical lymph node versus a mediastinal lymph node biopsy with mediastinoscopy.

## 2016-12-15 ENCOUNTER — Other Ambulatory Visit: Payer: Self-pay | Admitting: Radiology

## 2016-12-15 ENCOUNTER — Telehealth: Payer: Self-pay | Admitting: *Deleted

## 2016-12-15 NOTE — Telephone Encounter (Signed)
Patient's carafate suspension is covered by [employer] from 09/13/16-12/12/17 per recent fax we received 12/12/16. Original fax has been sent for scanning.

## 2016-12-16 ENCOUNTER — Other Ambulatory Visit: Payer: Self-pay | Admitting: Student

## 2016-12-17 ENCOUNTER — Ambulatory Visit (HOSPITAL_COMMUNITY)
Admission: RE | Admit: 2016-12-17 | Discharge: 2016-12-17 | Disposition: A | Discharge status: Home / Self Care | Payer: Medicare Other | Source: Ambulatory Visit | Attending: Hematology and Oncology | Admitting: Hematology and Oncology

## 2016-12-17 DIAGNOSIS — R590 Localized enlarged lymph nodes: Secondary | ICD-10-CM | POA: Diagnosis not present

## 2016-12-17 DIAGNOSIS — C50411 Malignant neoplasm of upper-outer quadrant of right female breast: Secondary | ICD-10-CM

## 2016-12-17 DIAGNOSIS — E061 Subacute thyroiditis: Secondary | ICD-10-CM | POA: Diagnosis not present

## 2016-12-17 DIAGNOSIS — E041 Nontoxic single thyroid nodule: Secondary | ICD-10-CM | POA: Diagnosis not present

## 2016-12-17 DIAGNOSIS — Z170 Estrogen receptor positive status [ER+]: Secondary | ICD-10-CM

## 2016-12-17 MED ORDER — LIDOCAINE HCL (PF) 1 % IJ SOLN
INTRAMUSCULAR | Status: AC
  Filled 2016-12-17: qty 10

## 2016-12-17 NOTE — Procedures (Signed)
US guided FNA of left thyroid lesion/mass.  Difficult to differentiate lesion from normal tissue.  Surrounding enlarged lymph nodes.  8 FNAs obtained.  Minimal blood loss and no immediate complication.

## 2016-12-18 ENCOUNTER — Telehealth: Payer: Self-pay | Admitting: Hematology and Oncology

## 2016-12-18 NOTE — Telephone Encounter (Signed)
I called the patient to inform her that thyroid biopsy was non diagnostic. I discussed the case with Dr.Rosen and he will get her in to do an excisional biopsy. Also discussed with Dr.Sherill.

## 2016-12-23 NOTE — H&P (Signed)
HPI:   Kristen Escobar is a 71 y.o. female who presents as a consult Patient.   Referring Provider: Ike Bene*  Chief complaint: Hoarseness.  HPI: On May 4 she awoke in the morning with significant change in her voice. It has not really improved since then. She was having heartburn and was started on PPI therapy and her heartburn has completely resolved. She does not have any dietary risk factors for reflux. She is not having trouble swallowing and not suffering with any aspiration. She can drink liquids without any difficulty. She does have a history of breast cancer.  PMH/Meds/All/SocHx/FamHx/ROS:   Past Medical History:  Diagnosis Date  . Acid reflux  . High cholesterol  . Hypertension   Past Surgical History:  Procedure Laterality Date  . BREAST BIOPSY  . BREAST LUMPECTOMY  . CATARACT EXTRACTION  . HIP SURGERY  . lump  . TUBAL LIGATION   No family history of bleeding disorders, wound healing problems or difficulty with anesthesia.   Social History   Social History  . Marital status: Married  Spouse name: N/A  . Number of children: N/A  . Years of education: N/A   Occupational History  . Not on file.   Social History Main Topics  . Smoking status: Former Research scientist (life sciences)  . Smokeless tobacco: Never Used  . Alcohol use Not on file  . Drug use: Unknown  . Sexual activity: Not on file   Other Topics Concern  . Not on file   Social History Narrative  . No narrative on file   Current Outpatient Prescriptions:  . atorvastatin (LIPITOR) 10 MG tablet, TAKE 1 TABLET BY MOUTH EVERY DAY, Disp: , Rfl: 3 . levothyroxine (SYNTHROID, LEVOTHROID) 50 MCG tablet, , Disp: , Rfl:  . losartan-hydrochlorothiazide (HYZAAR) 100-25 mg per tablet, TAKE 1 TABLET BY MOUTH EVERY DAY, Disp: , Rfl: 1 . omeprazole (PRILOSEC) 20 MG capsule, TAKE 1 CAPSULE BY MOUTH EVERY DAY, Disp: , Rfl: 3 . tamoxifen (NOLVADEX) 20 MG tablet, TAKE 1 TABLET (20 MG TOTAL) BY MOUTH DAILY., Disp: , Rfl:  2  A complete ROS was performed with pertinent positives/negatives noted in the HPI. The remainder of the ROS are negative.   Physical Exam:   BP 140/73 (Site: Left arm, Position: Sitting)  Ht 1.6 m (5\' 3" )  Wt 106.6 kg (235 lb)  BMI 41.63 kg/m   General: Healthy and alert, in no distress, breathing easily. Normal affect. In a pleasant mood. Head: Normocephalic, atraumatic. No masses, or scars. Eyes: Pupils are equal, and reactive to light. Vision is grossly intact. No spontaneous or gaze nystagmus. Ears: Ear canals are clear. Tympanic membranes are intact, with normal landmarks and the middle ears are clear and healthy. Hearing: Grossly normal. Nose: Nasal cavities are clear with healthy mucosa, no polyps or exudate.Airways are patent. Face: No masses or scars, facial nerve function is symmetric. Oral Cavity: No mucosal abnormalities are noted. Tongue with normal mobility. Dentition appears healthy. Oropharynx: Tonsils are symmetric. There are no mucosal masses identified. Tongue base appears normal and healthy. Larynx/Hypopharynx: Indirect examination reveals healthy mucosal surfaces but a left vocal cord paralysis. Chest: Deferred Neck: No palpable masses, no cervical adenopathy, no thyroid nodules or enlargement. Neuro: Cranial nerves II-XII will normal function. Balance: Normal gate. Other findings: none.  Independent Review of Additional Tests or Records:  none  Procedures:  none  Impression & Plans:  Unilateral vocal cord paralysis, left. History of breast cancer. No palpable masses in the neck. Recommend CT  imaging of the neck and upper chest area. This could represent thyroid disease, a lung or chest process, metastatic breast cancer, or idiopathic. We discussed possible surgical intervention if needed to strengthen her voice. Fortunately she is not aspirating. We will discuss results of the imaging when available.

## 2016-12-24 ENCOUNTER — Encounter (HOSPITAL_COMMUNITY)
Admission: RE | Admit: 2016-12-24 | Discharge: 2016-12-24 | Disposition: A | Discharge status: Home / Self Care | Payer: Medicare Other | Source: Ambulatory Visit | Attending: Otolaryngology | Admitting: Otolaryngology

## 2016-12-24 ENCOUNTER — Encounter (HOSPITAL_COMMUNITY): Payer: Self-pay

## 2016-12-24 ENCOUNTER — Other Ambulatory Visit (HOSPITAL_COMMUNITY): Payer: Self-pay | Admitting: *Deleted

## 2016-12-24 ENCOUNTER — Other Ambulatory Visit: Payer: Self-pay | Admitting: Family Medicine

## 2016-12-24 DIAGNOSIS — E7800 Pure hypercholesterolemia, unspecified: Secondary | ICD-10-CM | POA: Diagnosis not present

## 2016-12-24 DIAGNOSIS — K219 Gastro-esophageal reflux disease without esophagitis: Secondary | ICD-10-CM | POA: Diagnosis not present

## 2016-12-24 DIAGNOSIS — I10 Essential (primary) hypertension: Secondary | ICD-10-CM | POA: Diagnosis not present

## 2016-12-24 DIAGNOSIS — E039 Hypothyroidism, unspecified: Secondary | ICD-10-CM | POA: Diagnosis not present

## 2016-12-24 DIAGNOSIS — Z79899 Other long term (current) drug therapy: Secondary | ICD-10-CM | POA: Diagnosis not present

## 2016-12-24 DIAGNOSIS — M1990 Unspecified osteoarthritis, unspecified site: Secondary | ICD-10-CM | POA: Diagnosis not present

## 2016-12-24 DIAGNOSIS — E079 Disorder of thyroid, unspecified: Secondary | ICD-10-CM | POA: Diagnosis not present

## 2016-12-24 DIAGNOSIS — Z87891 Personal history of nicotine dependence: Secondary | ICD-10-CM | POA: Diagnosis not present

## 2016-12-24 DIAGNOSIS — Z853 Personal history of malignant neoplasm of breast: Secondary | ICD-10-CM | POA: Diagnosis not present

## 2016-12-24 HISTORY — DX: Hypothyroidism, unspecified: E03.9

## 2016-12-24 HISTORY — DX: Pneumonia, unspecified organism: J18.9

## 2016-12-24 HISTORY — DX: Gastro-esophageal reflux disease without esophagitis: K21.9

## 2016-12-24 HISTORY — DX: Disorder of thyroid, unspecified: E07.9

## 2016-12-24 LAB — CBC
HCT: 40.9 % (ref 36.0–46.0)
Hemoglobin: 13.4 g/dL (ref 12.0–15.0)
MCH: 27.9 pg (ref 26.0–34.0)
MCHC: 32.8 g/dL (ref 30.0–36.0)
MCV: 85.2 fL (ref 78.0–100.0)
PLATELETS: 262 10*3/uL (ref 150–400)
RBC: 4.8 MIL/uL (ref 3.87–5.11)
RDW: 14.1 % (ref 11.5–15.5)
WBC: 10.8 10*3/uL — AB (ref 4.0–10.5)

## 2016-12-24 LAB — BASIC METABOLIC PANEL
Anion gap: 12 (ref 5–15)
BUN: 25 mg/dL — ABNORMAL HIGH (ref 6–20)
CHLORIDE: 101 mmol/L (ref 101–111)
CO2: 26 mmol/L (ref 22–32)
CREATININE: 1.54 mg/dL — AB (ref 0.44–1.00)
Calcium: 9.5 mg/dL (ref 8.9–10.3)
GFR, EST AFRICAN AMERICAN: 38 mL/min — AB (ref >60)
GFR, EST NON AFRICAN AMERICAN: 33 mL/min — AB (ref >60)
Glucose, Bld: 112 mg/dL — ABNORMAL HIGH (ref 65–99)
POTASSIUM: 3.3 mmol/L — AB (ref 3.5–5.1)
SODIUM: 139 mmol/L (ref 135–145)

## 2016-12-24 NOTE — Progress Notes (Signed)
Pt denies cardiac history, chest pain or sob. Pt states she is not diabetic. 

## 2016-12-24 NOTE — Pre-Procedure Instructions (Signed)
Kristen Escobar  12/24/2016    Your procedure is scheduled on Friday, December 26, 2016 at 9:50 AM.   Report to [facility] Entrance "A" Admitting Office  at 7:50 AM.   Call this number if you have problems the morning of surgery: [phone]   Questions prior to day of surgery, please call [phone] between 8 & 4 PM.   Remember:  Do not eat food or drink liquids after midnight Thursday, 12/25/16.   Take these medicines the morning of surgery with A SIP OF WATER: Levothyroxine (Synthroid), Omeprazole (Prilosec), Tamoxifen (Nolvadex)  Stop NSAIDS (Ibuprofen, Aleve, etc) as of today. Do not use Aspirin products prior to surgery.   Do not wear jewelry, make-up or nail polish.  Do not wear lotions, powders, perfumes or deodorant.  Do not shave 48 hours prior to surgery.   Do not bring valuables to the hospital.  [facility] is not responsible for any belongings or valuables.  Contacts, dentures or bridgework may not be worn into surgery.  Leave your suitcase in the car.  After surgery it may be brought to your room.  For patients admitted to the hospital, discharge time will be determined by your treatment team.  Patients discharged the day of surgery will not be allowed to drive home.   [facility] - Preparing for Surgery  Before surgery, you can play an important role.  Because skin is not sterile, your skin needs to be as free of germs as possible.  You can reduce the number of germs on you skin by washing with CHG (chlorahexidine gluconate) soap before surgery.  CHG is an antiseptic cleaner which kills germs and bonds with the skin to continue killing germs even after washing.  Please DO NOT use if you have an allergy to CHG or antibacterial soaps.  If your skin becomes reddened/irritated stop using the CHG and inform your nurse when you arrive at Short Stay.  Do not shave (including legs and underarms) for at least 48 hours prior to the first CHG shower.  You may shave  your face.  Please follow these instructions carefully:   1.  Shower with CHG Soap the night before surgery and the                    morning of Surgery.  2.  If you choose to wash your hair, wash your hair first as usual with your       normal shampoo.  3.  After you shampoo, rinse your hair and body thoroughly to remove the shampoo.  4.  Use CHG as you would any other liquid soap.  You can apply chg directly       to the skin and wash gently with scrungie or a clean washcloth.  5.  Apply the CHG Soap to your body ONLY FROM THE NECK DOWN.        Do not use on open wounds or open sores.  Avoid contact with your eyes, ears, mouth and genitals (private parts).  Wash genitals (private parts) with your normal soap.  6.  Wash thoroughly, paying special attention to the area where your surgery        will be performed.  7.  Thoroughly rinse your body with warm water from the neck down.  8.  DO NOT shower/wash with your normal soap after using and rinsing off       the CHG Soap.  9.  Pat yourself dry  with a clean towel.            10.  Wear clean pajamas.            11.  Place clean sheets on your bed the night of your first shower and do not        sleep with pets.  Day of Surgery  Do not apply any lotions/deodorants the morning of surgery.  Please wear clean clothes to the hospital.   Please read over the fact sheets that you were given.

## 2016-12-25 ENCOUNTER — Other Ambulatory Visit: Payer: Self-pay | Admitting: Internal Medicine

## 2016-12-26 ENCOUNTER — Observation Stay (HOSPITAL_COMMUNITY)
Admission: RE | Admit: 2016-12-26 | Discharge: 2016-12-27 | Disposition: A | Discharge status: Home / Self Care | Payer: Medicare Other | Source: Ambulatory Visit | Attending: Otolaryngology | Admitting: Otolaryngology

## 2016-12-26 ENCOUNTER — Ambulatory Visit (HOSPITAL_COMMUNITY): Payer: Medicare Other | Admitting: Certified Registered Nurse Anesthetist

## 2016-12-26 ENCOUNTER — Encounter (HOSPITAL_COMMUNITY)
Admission: RE | Disposition: A | Discharge status: Home / Self Care | Payer: Self-pay | Source: Ambulatory Visit | Attending: Otolaryngology

## 2016-12-26 ENCOUNTER — Encounter (HOSPITAL_COMMUNITY): Payer: Self-pay

## 2016-12-26 DIAGNOSIS — K219 Gastro-esophageal reflux disease without esophagitis: Secondary | ICD-10-CM | POA: Diagnosis not present

## 2016-12-26 DIAGNOSIS — Z87891 Personal history of nicotine dependence: Secondary | ICD-10-CM | POA: Insufficient documentation

## 2016-12-26 DIAGNOSIS — E039 Hypothyroidism, unspecified: Secondary | ICD-10-CM | POA: Insufficient documentation

## 2016-12-26 DIAGNOSIS — Z79899 Other long term (current) drug therapy: Secondary | ICD-10-CM | POA: Diagnosis not present

## 2016-12-26 DIAGNOSIS — R221 Localized swelling, mass and lump, neck: Secondary | ICD-10-CM | POA: Diagnosis not present

## 2016-12-26 DIAGNOSIS — C8339 Diffuse large B-cell lymphoma, extranodal and solid organ sites: Secondary | ICD-10-CM | POA: Diagnosis not present

## 2016-12-26 DIAGNOSIS — I10 Essential (primary) hypertension: Secondary | ICD-10-CM | POA: Diagnosis not present

## 2016-12-26 DIAGNOSIS — Z853 Personal history of malignant neoplasm of breast: Secondary | ICD-10-CM | POA: Diagnosis not present

## 2016-12-26 DIAGNOSIS — E079 Disorder of thyroid, unspecified: Secondary | ICD-10-CM | POA: Diagnosis not present

## 2016-12-26 DIAGNOSIS — E0789 Other specified disorders of thyroid: Secondary | ICD-10-CM | POA: Diagnosis not present

## 2016-12-26 DIAGNOSIS — J3800 Paralysis of vocal cords and larynx, unspecified: Secondary | ICD-10-CM | POA: Diagnosis not present

## 2016-12-26 DIAGNOSIS — M1990 Unspecified osteoarthritis, unspecified site: Secondary | ICD-10-CM | POA: Diagnosis not present

## 2016-12-26 DIAGNOSIS — E7800 Pure hypercholesterolemia, unspecified: Secondary | ICD-10-CM | POA: Insufficient documentation

## 2016-12-26 HISTORY — PX: EXCISION MASS NECK: SHX6703

## 2016-12-26 SURGERY — EXCISION, MASS, NECK
Anesthesia: General | Site: Neck | Laterality: Left

## 2016-12-26 MED ORDER — CEPHALEXIN 250 MG/5ML PO SUSR
500.0000 mg | Freq: Two times a day (BID) | ORAL | Status: DC
  Administered 2016-12-26 – 2016-12-27 (×3): 500 mg via ORAL
  Filled 2016-12-26 (×4): qty 10

## 2016-12-26 MED ORDER — SUGAMMADEX SODIUM 200 MG/2ML IV SOLN
INTRAVENOUS | Status: DC | PRN
  Administered 2016-12-26: 200 mg via INTRAVENOUS

## 2016-12-26 MED ORDER — FENTANYL CITRATE (PF) 100 MCG/2ML IJ SOLN: 25.0000 ug | INTRAMUSCULAR | Status: DC | PRN

## 2016-12-26 MED ORDER — HYDROCODONE-ACETAMINOPHEN 7.5-325 MG/15ML PO SOLN: 15.0000 mL | Freq: Four times a day (QID) | ORAL | 0 refills | Status: DC | PRN

## 2016-12-26 MED ORDER — ATORVASTATIN CALCIUM 10 MG PO TABS
10.0000 mg | ORAL_TABLET | Freq: Every day | ORAL | Status: DC
  Administered 2016-12-27: 10 mg via ORAL
  Filled 2016-12-26: qty 1

## 2016-12-26 MED ORDER — FENTANYL CITRATE (PF) 250 MCG/5ML IJ SOLN
INTRAMUSCULAR | Status: AC
  Filled 2016-12-26: qty 5

## 2016-12-26 MED ORDER — CEPHALEXIN 500 MG PO CAPS: 500.0000 mg | ORAL_CAPSULE | Freq: Three times a day (TID) | ORAL | 0 refills | Status: DC

## 2016-12-26 MED ORDER — ONDANSETRON HCL 4 MG/2ML IJ SOLN
INTRAMUSCULAR | Status: DC | PRN
  Administered 2016-12-26: 4 mg via INTRAVENOUS

## 2016-12-26 MED ORDER — HYDROCODONE-ACETAMINOPHEN 7.5-325 MG/15ML PO SOLN: 10.0000 mL | ORAL | Status: DC | PRN

## 2016-12-26 MED ORDER — SUCRALFATE 1 GM/10ML PO SUSP
1.0000 g | Freq: Three times a day (TID) | ORAL | Status: DC
  Administered 2016-12-26 – 2016-12-27 (×3): 1 g via ORAL
  Filled 2016-12-26 (×3): qty 10

## 2016-12-26 MED ORDER — SUCCINYLCHOLINE CHLORIDE 200 MG/10ML IV SOSY
PREFILLED_SYRINGE | INTRAVENOUS | Status: DC | PRN
  Administered 2016-12-26: 120 mg via INTRAVENOUS

## 2016-12-26 MED ORDER — DEXTROSE-NACL 5-0.9 % IV SOLN
INTRAVENOUS | Status: DC
  Administered 2016-12-26: 75 mL/h via INTRAVENOUS

## 2016-12-26 MED ORDER — FENTANYL CITRATE (PF) 250 MCG/5ML IJ SOLN
INTRAMUSCULAR | Status: DC | PRN
  Administered 2016-12-26: 100 ug via INTRAVENOUS
  Administered 2016-12-26: 50 ug via INTRAVENOUS
  Administered 2016-12-26: 100 ug via INTRAVENOUS

## 2016-12-26 MED ORDER — HYDROCHLOROTHIAZIDE 25 MG PO TABS
25.0000 mg | ORAL_TABLET | Freq: Every day | ORAL | Status: DC
  Administered 2016-12-26 – 2016-12-27 (×2): 25 mg via ORAL
  Filled 2016-12-26 (×2): qty 1

## 2016-12-26 MED ORDER — PROMETHAZINE HCL 25 MG PO TABS: 25.0000 mg | ORAL_TABLET | Freq: Four times a day (QID) | ORAL | Status: DC | PRN

## 2016-12-26 MED ORDER — LEVOTHYROXINE SODIUM 50 MCG PO TABS
50.0000 ug | ORAL_TABLET | Freq: Every day | ORAL | Status: DC
  Administered 2016-12-27: 50 ug via ORAL
  Filled 2016-12-26: qty 1

## 2016-12-26 MED ORDER — TAMOXIFEN CITRATE 10 MG PO TABS
20.0000 mg | ORAL_TABLET | Freq: Every day | ORAL | Status: DC
  Filled 2016-12-26 (×2): qty 2

## 2016-12-26 MED ORDER — HYDROCODONE-ACETAMINOPHEN 7.5-325 MG PO TABS: 1.0000 | ORAL_TABLET | Freq: Four times a day (QID) | ORAL | 0 refills | Status: DC | PRN

## 2016-12-26 MED ORDER — MIDAZOLAM HCL 2 MG/2ML IJ SOLN
INTRAMUSCULAR | Status: AC
  Filled 2016-12-26: qty 2

## 2016-12-26 MED ORDER — LIDOCAINE 2% (20 MG/ML) 5 ML SYRINGE
INTRAMUSCULAR | Status: DC | PRN
  Administered 2016-12-26: 100 mg via INTRAVENOUS

## 2016-12-26 MED ORDER — PROPOFOL 10 MG/ML IV BOLUS
INTRAVENOUS | Status: DC | PRN
  Administered 2016-12-26: 150 mg via INTRAVENOUS

## 2016-12-26 MED ORDER — PROMETHAZINE HCL 25 MG RE SUPP: 25.0000 mg | Freq: Four times a day (QID) | RECTAL | 1 refills | Status: DC | PRN

## 2016-12-26 MED ORDER — LOSARTAN POTASSIUM 50 MG PO TABS
100.0000 mg | ORAL_TABLET | Freq: Every day | ORAL | Status: DC
  Administered 2016-12-26 – 2016-12-27 (×2): 100 mg via ORAL
  Filled 2016-12-26 (×2): qty 2

## 2016-12-26 MED ORDER — MIDAZOLAM HCL 2 MG/2ML IJ SOLN
INTRAMUSCULAR | Status: DC | PRN
  Administered 2016-12-26: 2 mg via INTRAVENOUS

## 2016-12-26 MED ORDER — PROMETHAZINE HCL 25 MG RE SUPP: 25.0000 mg | Freq: Four times a day (QID) | RECTAL | Status: DC | PRN

## 2016-12-26 MED ORDER — LIDOCAINE VISCOUS 2 % MT SOLN: 20.0000 mL | OROMUCOSAL | 3 refills | Status: DC | PRN

## 2016-12-26 MED ORDER — PANTOPRAZOLE SODIUM 40 MG PO TBEC
40.0000 mg | DELAYED_RELEASE_TABLET | Freq: Every day | ORAL | Status: DC
  Administered 2016-12-27: 40 mg via ORAL
  Filled 2016-12-26: qty 1

## 2016-12-26 MED ORDER — ROCURONIUM BROMIDE 10 MG/ML (PF) SYRINGE
PREFILLED_SYRINGE | INTRAVENOUS | Status: DC | PRN
  Administered 2016-12-26: 50 mg via INTRAVENOUS

## 2016-12-26 MED ORDER — LACTATED RINGERS IV SOLN
INTRAVENOUS | Status: DC
  Administered 2016-12-26: 09:00:00 via INTRAVENOUS

## 2016-12-26 MED ORDER — LOSARTAN POTASSIUM-HCTZ 100-25 MG PO TABS: 1.0000 | ORAL_TABLET | Freq: Every day | ORAL | Status: DC

## 2016-12-26 MED ORDER — HYDROCODONE-ACETAMINOPHEN 5-325 MG PO TABS
1.0000 | ORAL_TABLET | ORAL | Status: DC | PRN
  Administered 2016-12-26: 2 via ORAL
  Administered 2016-12-26: 1 via ORAL
  Administered 2016-12-27: 2 via ORAL
  Filled 2016-12-26: qty 2
  Filled 2016-12-26: qty 1
  Filled 2016-12-26: qty 2
  Filled 2016-12-26: qty 1

## 2016-12-26 MED ORDER — LIDOCAINE-EPINEPHRINE 1 %-1:100000 IJ SOLN
INTRAMUSCULAR | Status: AC
  Filled 2016-12-26: qty 1

## 2016-12-26 MED ORDER — 0.9 % SODIUM CHLORIDE (POUR BTL) OPTIME
TOPICAL | Status: DC | PRN
  Administered 2016-12-26: 1000 mL

## 2016-12-26 MED ORDER — IBUPROFEN 400 MG PO TABS
400.0000 mg | ORAL_TABLET | Freq: Three times a day (TID) | ORAL | Status: DC | PRN
  Administered 2016-12-27: 400 mg via ORAL
  Filled 2016-12-26: qty 1

## 2016-12-26 SURGICAL SUPPLY — 50 items
APPLIER CLIP 9.375 SM OPEN (CLIP)
BLADE SURG 15 STRL LF DISP TIS (BLADE) IMPLANT
BLADE SURG 15 STRL SS (BLADE)
CANISTER SUCT 3000ML PPV (MISCELLANEOUS) ×3 IMPLANT
CLEANER TIP ELECTROSURG 2X2 (MISCELLANEOUS) ×3 IMPLANT
CLIP APPLIE 9.375 SM OPEN (CLIP) IMPLANT
CONT SPEC 4OZ CLIKSEAL STRL BL (MISCELLANEOUS) ×6 IMPLANT
CORDS BIPOLAR (ELECTRODE) ×3 IMPLANT
COVER SURGICAL LIGHT HANDLE (MISCELLANEOUS) ×3 IMPLANT
DERMABOND ADVANCED (GAUZE/BANDAGES/DRESSINGS) ×2
DERMABOND ADVANCED .7 DNX12 (GAUZE/BANDAGES/DRESSINGS) ×1 IMPLANT
DRAIN CHANNEL 15F RND FF W/TCR (WOUND CARE) IMPLANT
DRAIN SNY 10 ROU (WOUND CARE) IMPLANT
DRAPE HALF SHEET 40X57 (DRAPES) IMPLANT
DRAPE INCISE 23X17 IOBAN STRL (DRAPES)
DRAPE INCISE IOBAN 23X17 STRL (DRAPES) IMPLANT
ELECT COATED BLADE 2.86 ST (ELECTRODE) ×3 IMPLANT
ELECT REM PT RETURN 9FT ADLT (ELECTROSURGICAL) ×3
ELECTRODE REM PT RTRN 9FT ADLT (ELECTROSURGICAL) ×1 IMPLANT
EVACUATOR SILICONE 100CC (DRAIN) ×3 IMPLANT
FORCEPS BIPOLAR SPETZLER 8 1.0 (NEUROSURGERY SUPPLIES) ×3 IMPLANT
GAUZE SPONGE 4X4 16PLY XRAY LF (GAUZE/BANDAGES/DRESSINGS) IMPLANT
GLOVE ECLIPSE 7.5 STRL STRAW (GLOVE) ×3 IMPLANT
GOWN STRL REUS W/ TWL LRG LVL3 (GOWN DISPOSABLE) ×2 IMPLANT
GOWN STRL REUS W/TWL LRG LVL3 (GOWN DISPOSABLE) ×4
KIT BASIN OR (CUSTOM PROCEDURE TRAY) ×3 IMPLANT
KIT ROOM TURNOVER OR (KITS) ×3 IMPLANT
LOCATOR NERVE 3 VOLT (DISPOSABLE) IMPLANT
NEEDLE PRECISIONGLIDE 27X1.5 (NEEDLE) ×3 IMPLANT
NS IRRIG 1000ML POUR BTL (IV SOLUTION) ×3 IMPLANT
PAD ARMBOARD 7.5X6 YLW CONV (MISCELLANEOUS) ×6 IMPLANT
PENCIL FOOT CONTROL (ELECTRODE) ×3 IMPLANT
SPECIMEN JAR MEDIUM (MISCELLANEOUS) IMPLANT
SPONGE INTESTINAL PEANUT (DISPOSABLE) IMPLANT
SPONGE LAP 18X18 X RAY DECT (DISPOSABLE) IMPLANT
STAPLER VISISTAT 35W (STAPLE) ×3 IMPLANT
SUT CHROMIC 3 0 SH 27 (SUTURE) ×3 IMPLANT
SUT CHROMIC 5 0 P 3 (SUTURE) IMPLANT
SUT ETHILON 3 0 PS 1 (SUTURE) ×3 IMPLANT
SUT ETHILON 5 0 PS 2 18 (SUTURE) IMPLANT
SUT SILK 2 0 REEL (SUTURE) IMPLANT
SUT SILK 3 0 SH CR/8 (SUTURE) IMPLANT
SUT SILK 4 0 REEL (SUTURE) ×3 IMPLANT
SUT VIC AB 3-0 SH 18 (SUTURE) IMPLANT
SYSTEM CHEST DRAIN TLS 7FR (DRAIN) ×3 IMPLANT
TOWEL OR 17X24 6PK STRL BLUE (TOWEL DISPOSABLE) ×3 IMPLANT
TRAY ENT MC OR (CUSTOM PROCEDURE TRAY) ×3 IMPLANT
TRAY FOLEY W/METER SILVER 14FR (SET/KITS/TRAYS/PACK) IMPLANT
TUBE FEEDING 10FR FLEXIFLO (MISCELLANEOUS) IMPLANT
WATER STERILE IRR 1000ML POUR (IV SOLUTION) IMPLANT

## 2016-12-26 NOTE — Op Note (Signed)
OPERATIVE REPORT  DATE OF SURGERY: 12/26/2016  PATIENT:  Kristen Escobar,  71 y.o. female  PRE-OPERATIVE DIAGNOSIS:  thyroid mass  POST-OPERATIVE DIAGNOSIS:  thyroid mass  PROCEDURE:  Procedure(s): EXCISION MASS NECK  SURGEON:  Beckie Salts, MD  ASSISTANTS: None  ANESTHESIA:   General   EBL:  30 ml  DRAINS: 7 [place] round JP  LOCAL MEDICATIONS USED:  None  SPECIMEN:  Left thyroid/neck mass, for frozen section and fresh for lymphoma workup.  COUNTS:  Correct  PROCEDURE DETAILS: The patient was taken to the operating room and placed on the operating table in the supine position. Following induction of general endotracheal anesthesia, the neck was prepped and draped in a standard fashion. A shoulder roll was used to extend the neck. A transverse incision was outlined with a marking pen in a cervical skin crease.   Electrocautery was used to incise the skin, subcutaneous kidneys tissue and platysma. A self-retaining retractor was used throughout the case. The midline fascia was divided and the strap muscles were reflected laterally off the left side. The mass was identified deep to the strap muscles along the lateral surface of the trachea on the left. It was very hard and fibrotic. Multiple pieces were taken and sent for frozen section analysis 2.   The first frozen section revealed fibrotic tissue without any tumor. The second one revealed similar findings plus some areas that appear to be neoplastic. Multiple additional pieces were sent fresh for additional workup.   The lesion extended from the thyroid cartilage down towards the second or third ring of the trachea. It extended laterally to the carotid and appeared to be fixed to the carotid. Anteriorly it was adhering to the undersurface of the strap muscles. I do not know the posterior extent of the tumor. Care was taken to avoid the carotid artery. Care was also taken to avoid the esophagus. 4-0 silk ties were used as needed for  hemostasis. The wound was irrigated with saline. The drain was exited through separate stab incision and secured in place with nylon suture. The platysmal layer was reapproximated with interrupted 3-0 chromic. The skin was reapproximated with running subcuticular closure and Dermabond was used on the skin. The drain was charged. The patient was awakened exudative and transferred to recovery in stable condition.    PATIENT DISPOSITION:  To PACU, stable

## 2016-12-26 NOTE — Anesthesia Preprocedure Evaluation (Signed)
Anesthesia Evaluation  Patient identified by MRN, date of birth, ID band Patient awake    Reviewed: Allergy & Precautions, NPO status , Patient's Chart, lab work & pertinent test results  Airway Mallampati: II  TM Distance: >3 FB     Dental   Pulmonary pneumonia, former smoker,    breath sounds clear to auscultation       Cardiovascular hypertension, + Peripheral Vascular Disease   Rhythm:Regular Rate:Normal     Neuro/Psych    GI/Hepatic Neg liver ROS, GERD  ,  Endo/Other  Hypothyroidism   Renal/GU Renal disease     Musculoskeletal  (+) Arthritis ,   Abdominal   Peds  Hematology   Anesthesia Other Findings   Reproductive/Obstetrics                             Anesthesia Physical Anesthesia Plan  ASA: III  Anesthesia Plan: General   Post-op Pain Management:    Induction: Intravenous  PONV Risk Score and Plan: 3 and Ondansetron, Dexamethasone, Midazolam, Propofol infusion and Treatment may vary due to age or medical condition  Airway Management Planned: Oral ETT  Additional Equipment:   Intra-op Plan:   Post-operative Plan: Possible Post-op intubation/ventilation  Informed Consent: I have reviewed the patients History and Physical, chart, labs and discussed the procedure including the risks, benefits and alternatives for the proposed anesthesia with the patient or authorized representative who has indicated his/her understanding and acceptance.   Dental advisory given  Plan Discussed with: CRNA, Anesthesiologist and Surgeon  Anesthesia Plan Comments:         Anesthesia Quick Evaluation

## 2016-12-26 NOTE — Discharge Instructions (Signed)
You may shower and use soap and water. Do not use any creams, oils or ointment. ° °

## 2016-12-26 NOTE — Anesthesia Procedure Notes (Signed)
Procedure Name: Intubation Date/Time: 12/26/2016 9:48 AM Performed by: Mariea Clonts Pre-anesthesia Checklist: Patient identified, Emergency Drugs available, Suction available and Patient being monitored Patient Re-evaluated:Patient Re-evaluated prior to induction Oxygen Delivery Method: Circle System Utilized Preoxygenation: Pre-oxygenation with 100% oxygen Induction Type: IV induction Ventilation: Mask ventilation without difficulty Laryngoscope Size: Mac and 3 Grade View: Grade I Tube type: Oral Tube size: 7.0 mm Number of attempts: 1 Airway Equipment and Method: Stylet and Oral airway Placement Confirmation: ETT inserted through vocal cords under direct vision,  positive ETCO2 and breath sounds checked- equal and bilateral Tube secured with: Tape Dental Injury: Teeth and Oropharynx as per pre-operative assessment

## 2016-12-26 NOTE — Care Management Obs Status (Signed)
[facility] NOTIFICATION   Patient Details  Name: Kristen Escobar MRN: [id number] Date of Birth: Sep 03, 1945   Medicare Observation Status Notification Given:  Yes    Marilu Favre, RN 12/26/2016, 1:53 PM

## 2016-12-26 NOTE — Anesthesia Postprocedure Evaluation (Signed)
Anesthesia Post Note  Patient: Kristen Escobar  Procedure(s) Performed: Procedure(s) (LRB): EXCISION MASS NECK (Left)     Patient location during evaluation: PACU Anesthesia Type: General Level of consciousness: awake Pain management: pain level controlled Vital Signs Assessment: post-procedure vital signs reviewed and stable Respiratory status: spontaneous breathing Cardiovascular status: stable Anesthetic complications: no    Last Vitals:  Vitals:   12/26/16 1136 12/26/16 1150  BP: (!) 150/61 (!) 161/68  Pulse: 76 73  Resp: (!) 25 13  Temp: 36.7 C   SpO2: 98% 95%    Last Pain:  Vitals:   12/26/16 0808  TempSrc: Oral                 Chon Buhl

## 2016-12-26 NOTE — Interval H&P Note (Signed)
History and Physical Interval Note:  12/26/2016 9:07 AM  Kristen Escobar  has presented today for surgery, with the diagnosis of thyroid mass  The various methods of treatment have been discussed with the patient and family. After consideration of risks, benefits and other options for treatment, the patient has consented to  Procedure(s) with comments: EXCISION MASS NECK (Left) - open excision of thyroid mass left side with frozen section as a surgical intervention .  [name] history has been reviewed, patient examined, no change in status, stable for surgery.  I have reviewed the patient's chart and labs.  Questions were answered to the patient's satisfaction.     Camella Seim

## 2016-12-26 NOTE — Progress Notes (Signed)
   Subjective:    Patient ID: Kristen Escobar, female    DOB: 1945/04/27, 71 y.o.   MRN: [id number]  HPI Having some incisional pain.  Continues with difficulty swallowing.  Review of Systems     Objective:   Physical Exam AF VSS Alert, NAD Voice somewhat breathy. Neck incision clean and intact, drain in place, no fluid collection.    Assessment & Plan:  Neck mass, dysphagia s/p open biopsy  Wound looks good.  Drain in place.  Likely discharge tomorrow.

## 2016-12-26 NOTE — Transfer of Care (Signed)
Immediate Anesthesia Transfer of Care Note  Patient: Kristen Escobar  Procedure(s) Performed: Procedure(s) with comments: EXCISION MASS NECK (Left) - open excision of thyroid mass left side with frozen section  Patient Location: PACU  Anesthesia Type:General  Level of Consciousness: awake, alert  and oriented  Airway & Oxygen Therapy: Patient Spontanous Breathing and Patient connected to nasal cannula oxygen  Post-op Assessment: Report given to RN, Post -op Vital signs reviewed and stable and Patient moving all extremities X 4  Post vital signs: Reviewed and stable  Last Vitals:  Vitals:   12/26/16 0808 12/26/16 1136  BP: (!) 173/66 (!) 150/61  Pulse: 71 76  Resp: 18 (!) 25  Temp: 36.5 C 36.7 C  SpO2: 96% 98%    Last Pain:  Vitals:   12/26/16 0808  TempSrc: Oral      Patients Stated Pain Goal: 2 (21/30/86 5784)  Complications: No apparent anesthesia complications

## 2016-12-27 DIAGNOSIS — Z853 Personal history of malignant neoplasm of breast: Secondary | ICD-10-CM | POA: Diagnosis not present

## 2016-12-27 DIAGNOSIS — Z79899 Other long term (current) drug therapy: Secondary | ICD-10-CM | POA: Diagnosis not present

## 2016-12-27 DIAGNOSIS — I10 Essential (primary) hypertension: Secondary | ICD-10-CM | POA: Diagnosis not present

## 2016-12-27 DIAGNOSIS — E7800 Pure hypercholesterolemia, unspecified: Secondary | ICD-10-CM | POA: Diagnosis not present

## 2016-12-27 DIAGNOSIS — E079 Disorder of thyroid, unspecified: Secondary | ICD-10-CM | POA: Diagnosis not present

## 2016-12-27 DIAGNOSIS — K219 Gastro-esophageal reflux disease without esophagitis: Secondary | ICD-10-CM | POA: Diagnosis not present

## 2016-12-27 NOTE — Discharge Summary (Signed)
Physician Discharge Summary  Patient ID: Kristen Escobar MRN: [id number] DOB/AGE: 10/29/1945 71 y.o.  Admit date: 12/26/2016 Discharge date: 12/27/2016  Admission Diagnoses: Neck mass, dysphagia  Discharge Diagnoses:  Active Problems:   Neck mass   Discharged Condition: good  Hospital Course: 72 year old female with progressive dysphagia found to be related to a lower neck mass.  Efforts at needle biopsy have been inconclusive.  She presented for open biopsy.  See operative note.  She was observed overnight with drain in place.  She did not have a lot of pain.  Dysphagia continues.  On POD 1, the drain output was not very much and she was felt stable for discharge.  The drain was removed.  Consults: None  Significant Diagnostic Studies: None  Treatments: surgery: Open biopsy of neck mass  Discharge Exam: Blood pressure (!) 115/44, pulse 71, temperature 98.3 F (36.8 C), temperature source Oral, resp. rate 16, height 5\' 3"  (1.6 m), weight 233 lb 1.6 oz (105.7 kg), SpO2 93 %. General appearance: alert, cooperative and no distress Neck: normal voice, neck incision clean and intact, no fluid collection, drain removed  Disposition: 06-Home-Health Care Svc  Discharge Instructions    Diet - low sodium heart healthy    Complete by:  As directed    Discharge instructions    Complete by:  As directed    Avoid strenuous activity.  Resume your normal diet.  Do not apply ointment to incision.  OK to get incision wet, gently pat dry.   Increase activity slowly    Complete by:  As directed      Allergies as of 12/27/2016   No Known Allergies     Medication List    TAKE these medications   atorvastatin 10 MG tablet Commonly known as:  LIPITOR TAKE 1 TABLET EVERY DAY   cephALEXin 500 MG capsule Commonly known as:  KEFLEX Take 1 capsule (500 mg total) by mouth 3 (three) times daily.   HYDROcodone-acetaminophen 7.5-325 MG tablet Commonly known as:  NORCO Take 1 tablet by mouth every 6  (six) hours as needed for moderate pain.   HYDROcodone-acetaminophen 7.5-325 mg/15 ml solution Commonly known as:  HYCET Take 15 mLs by mouth 4 (four) times daily as needed for moderate pain.   ibuprofen 200 MG tablet Commonly known as:  ADVIL,MOTRIN Take 400 mg by mouth every 8 (eight) hours as needed for mild pain or moderate pain (as needed).   levothyroxine 50 MCG tablet Commonly known as:  SYNTHROID, LEVOTHROID TAKE 1 TABLET BY MOUTH DAILY   lidocaine 2 % solution Commonly known as:  XYLOCAINE Use as directed 20 mLs in the mouth or throat every 3 (three) hours as needed for mouth pain.   losartan-hydrochlorothiazide 100-25 MG tablet Commonly known as:  HYZAAR TAKE 1 TABLET BY MOUTH EVERY DAY   omeprazole 20 MG capsule Commonly known as:  PRILOSEC TAKE 1 CAPSULE BY MOUTH EVERY DAY   promethazine 25 MG suppository Commonly known as:  PHENERGAN Place 1 suppository (25 mg total) rectally every 6 (six) hours as needed for nausea or vomiting.   sucralfate 1 GM/10ML suspension Commonly known as:  CARAFATE Take 10 mLs (1 g total) by mouth 4 (four) times daily -  with meals and at bedtime.   tamoxifen 20 MG tablet Commonly known as:  NOLVADEX Take 1 tablet (20 mg total) by mouth daily.            Discharge Care Instructions  Start     Ordered   12/27/16 0000  Increase activity slowly     12/27/16 0809   12/27/16 0000  Diet - low sodium heart healthy     12/27/16 0809   12/27/16 0000  Discharge instructions    Comments:  Avoid strenuous activity.  Resume your normal diet.  Do not apply ointment to incision.  OK to get incision wet, gently pat dry.   12/27/16 0809   12/26/16 0000  HYDROcodone-acetaminophen (NORCO) 7.5-325 MG tablet  Every 6 hours PRN     12/26/16 1130   12/26/16 0000  cephALEXin (KEFLEX) 500 MG capsule  3 times daily     12/26/16 1130   12/26/16 0000  promethazine (PHENERGAN) 25 MG suppository  Every 6 hours PRN     12/26/16 1130    12/26/16 0000  lidocaine (XYLOCAINE) 2 % solution  Every  3 hours PRN     12/26/16 1158   12/26/16 0000  HYDROcodone-acetaminophen (HYCET) 7.5-325 mg/15 ml solution  4 times daily PRN     12/26/16 1158     Follow-up Information    Izora Gala, MD. Schedule an appointment as soon as possible for a visit in 1 week(s).   Specialty:  Otolaryngology Contact information: [address] [place] [address] [phone]           Signed: Melida Quitter 12/27/2016, 8:09 AM

## 2016-12-27 NOTE — Progress Notes (Signed)
Pt for discharge going home, health teachings, next appointment, JP was removed by MD, wound site with skin glued neck with dressing, prescriptions for pain meds given, removed peripheral IV line, given all her personal belongings, also given pain meds prior to discharge, no s/s of distress noted.

## 2016-12-29 ENCOUNTER — Encounter (HOSPITAL_COMMUNITY): Payer: Self-pay | Admitting: Otolaryngology

## 2017-01-01 ENCOUNTER — Other Ambulatory Visit: Payer: Self-pay

## 2017-01-01 ENCOUNTER — Telehealth: Payer: Self-pay | Admitting: Hematology and Oncology

## 2017-01-01 ENCOUNTER — Other Ambulatory Visit: Payer: Self-pay | Admitting: Hematology and Oncology

## 2017-01-01 DIAGNOSIS — Z5181 Encounter for therapeutic drug level monitoring: Secondary | ICD-10-CM

## 2017-01-01 DIAGNOSIS — C8331 Diffuse large B-cell lymphoma, lymph nodes of head, face, and neck: Secondary | ICD-10-CM

## 2017-01-01 DIAGNOSIS — C50411 Malignant neoplasm of upper-outer quadrant of right female breast: Secondary | ICD-10-CM

## 2017-01-01 DIAGNOSIS — Z79899 Other long term (current) drug therapy: Secondary | ICD-10-CM

## 2017-01-01 DIAGNOSIS — Z170 Estrogen receptor positive status [ER+]: Principal | ICD-10-CM

## 2017-01-01 DIAGNOSIS — C8330 Diffuse large B-cell lymphoma, unspecified site: Secondary | ICD-10-CM | POA: Insufficient documentation

## 2017-01-01 MED ORDER — ALLOPURINOL 300 MG PO TABS: 300.0000 mg | ORAL_TABLET | Freq: Every day | ORAL | 3 refills | Status: DC

## 2017-01-01 MED ORDER — PROCHLORPERAZINE MALEATE 10 MG PO TABS: 10.0000 mg | ORAL_TABLET | Freq: Four times a day (QID) | ORAL | 6 refills | Status: DC | PRN

## 2017-01-01 MED ORDER — ALLOPURINOL 300 MG PO TABS: 150.0000 mg | ORAL_TABLET | Freq: Every day | ORAL | 3 refills | Status: DC

## 2017-01-01 MED ORDER — ONDANSETRON HCL 8 MG PO TABS: 8.0000 mg | ORAL_TABLET | Freq: Two times a day (BID) | ORAL | 1 refills | Status: DC | PRN

## 2017-01-01 MED ORDER — LORAZEPAM 0.5 MG PO TABS: 0.5000 mg | ORAL_TABLET | Freq: Four times a day (QID) | ORAL | 0 refills | Status: DC | PRN

## 2017-01-01 MED ORDER — PREDNISONE 20 MG PO TABS: 60.0000 mg | ORAL_TABLET | Freq: Every day | ORAL | 0 refills | Status: DC

## 2017-01-01 MED ORDER — LIDOCAINE-PRILOCAINE 2.5-2.5 % EX CREA: TOPICAL_CREAM | CUTANEOUS | 3 refills | Status: DC

## 2017-01-01 NOTE — Telephone Encounter (Signed)
I received the pathology report from the patient's biopsy showing diffuse large B-cell lymphoma. I called the patient and informed her of the diagnosis As part of the staging she will need a bone marrow biopsy which will be done next Tuesday at 8 AM She will receive chemotherapy with R CHOP every 3 weeks 6 cycles I will set her up for echocardiogram, port placement and to chemotherapy class. I will discuss with the patient and her family next Tuesday prior to the bone marrow biopsy to go over that chemotherapy treatment plan. Our plan is to start chemotherapy on 01/09/2017. I sent a prescription for allopurinol today.

## 2017-01-01 NOTE — Progress Notes (Signed)
START ON PATHWAY REGIMEN - Lymphoma and CLL     A cycle is every 21 days:     Rituximab      Cyclophosphamide      Doxorubicin      Vincristine      Prednisone   **Always confirm dose/schedule in your pharmacy ordering system**    Patient Characteristics: Diffuse Large B Cell Lymphoma, First Line, Stage III and IV Disease Type: Not Applicable Disease Type: Diffuse Large B Cell Line of therapy: First Line [place] Stage: Unknown Intent of Therapy: Curative Intent, Discussed with Patient

## 2017-01-02 ENCOUNTER — Telehealth: Payer: Self-pay

## 2017-01-02 ENCOUNTER — Telehealth: Payer: Self-pay | Admitting: Hematology and Oncology

## 2017-01-02 ENCOUNTER — Other Ambulatory Visit: Payer: Self-pay

## 2017-01-02 DIAGNOSIS — C50411 Malignant neoplasm of upper-outer quadrant of right female breast: Secondary | ICD-10-CM

## 2017-01-02 DIAGNOSIS — Z170 Estrogen receptor positive status [ER+]: Principal | ICD-10-CM

## 2017-01-02 NOTE — Telephone Encounter (Signed)
Spoke with Flow cytometry today and okay to add pt for bone marron biopsy next Tuesday. Notified scheduling to get appt for tuesday

## 2017-01-02 NOTE — Telephone Encounter (Signed)
Spoke with patient regarding her upcoming appts.  °

## 2017-01-05 ENCOUNTER — Telehealth: Payer: Self-pay

## 2017-01-05 NOTE — Telephone Encounter (Signed)
Spoke with IR stating that they won't have any openings for port placement until 9/26 next week. Discussed with Dr.Gudena and okay to postpone first chemo from 9/21 to 9/28 next week after port placement. Called pt and husband to confirm this appt change. Pt to come in tomorrow for bone marrow biopsy. Pt and husband verbalized understanding and confirmed appt for tomorrow.

## 2017-01-06 ENCOUNTER — Other Ambulatory Visit: Payer: Medicare Other

## 2017-01-06 ENCOUNTER — Ambulatory Visit (HOSPITAL_BASED_OUTPATIENT_CLINIC_OR_DEPARTMENT_OTHER): Payer: Medicare Other | Admitting: Hematology and Oncology

## 2017-01-06 ENCOUNTER — Encounter: Payer: Self-pay | Admitting: *Deleted

## 2017-01-06 ENCOUNTER — Encounter: Payer: Self-pay | Admitting: Hematology and Oncology

## 2017-01-06 ENCOUNTER — Other Ambulatory Visit: Payer: Self-pay

## 2017-01-06 ENCOUNTER — Other Ambulatory Visit (HOSPITAL_BASED_OUTPATIENT_CLINIC_OR_DEPARTMENT_OTHER): Payer: Medicare Other

## 2017-01-06 ENCOUNTER — Other Ambulatory Visit (HOSPITAL_COMMUNITY)
Admission: RE | Admit: 2017-01-06 | Discharge: 2017-01-06 | Disposition: A | Discharge status: Home / Self Care | Payer: Medicare Other | Source: Ambulatory Visit | Attending: Hematology and Oncology | Admitting: Hematology and Oncology

## 2017-01-06 ENCOUNTER — Ambulatory Visit: Payer: Medicare Other

## 2017-01-06 VITALS — BP 184/80 | HR 70 | Resp 17

## 2017-01-06 DIAGNOSIS — Z170 Estrogen receptor positive status [ER+]: Secondary | ICD-10-CM

## 2017-01-06 DIAGNOSIS — C8331 Diffuse large B-cell lymphoma, lymph nodes of head, face, and neck: Secondary | ICD-10-CM

## 2017-01-06 DIAGNOSIS — C50411 Malignant neoplasm of upper-outer quadrant of right female breast: Secondary | ICD-10-CM

## 2017-01-06 DIAGNOSIS — D72829 Elevated white blood cell count, unspecified: Secondary | ICD-10-CM | POA: Diagnosis not present

## 2017-01-06 DIAGNOSIS — E041 Nontoxic single thyroid nodule: Secondary | ICD-10-CM | POA: Insufficient documentation

## 2017-01-06 DIAGNOSIS — D7589 Other specified diseases of blood and blood-forming organs: Secondary | ICD-10-CM | POA: Diagnosis not present

## 2017-01-06 DIAGNOSIS — Z853 Personal history of malignant neoplasm of breast: Secondary | ICD-10-CM

## 2017-01-06 LAB — COMPREHENSIVE METABOLIC PANEL
ALBUMIN: 3.3 g/dL — AB (ref 3.5–5.0)
ALK PHOS: 55 U/L (ref 40–150)
ALT: 25 U/L (ref 0–55)
AST: 19 U/L (ref 5–34)
Anion Gap: 12 mEq/L — ABNORMAL HIGH (ref 3–11)
BUN: 28.7 mg/dL — AB (ref 7.0–26.0)
CALCIUM: 9.5 mg/dL (ref 8.4–10.4)
CO2: 26 mEq/L (ref 22–29)
CREATININE: 1.5 mg/dL — AB (ref 0.6–1.1)
Chloride: 102 mEq/L (ref 98–109)
EGFR: 36 mL/min/{1.73_m2} — ABNORMAL LOW (ref >90)
GLUCOSE: 103 mg/dL (ref 70–140)
Potassium: 3.8 mEq/L (ref 3.5–5.1)
SODIUM: 140 meq/L (ref 136–145)
Total Bilirubin: 0.33 mg/dL (ref 0.20–1.20)
Total Protein: 7.5 g/dL (ref 6.4–8.3)

## 2017-01-06 LAB — CBC WITH DIFFERENTIAL/PLATELET
BASO%: 0.5 % (ref 0.0–2.0)
Basophils Absolute: 0.1 10*3/uL (ref 0.0–0.1)
EOS%: 2.2 % (ref 0.0–7.0)
Eosinophils Absolute: 0.3 10*3/uL (ref 0.0–0.5)
HEMATOCRIT: 37.2 % (ref 34.8–46.6)
HGB: 12.4 g/dL (ref 11.6–15.9)
LYMPH#: 1.8 10*3/uL (ref 0.9–3.3)
LYMPH%: 15.2 % (ref 14.0–49.7)
MCH: 28.3 pg (ref 25.1–34.0)
MCHC: 33.3 g/dL (ref 31.5–36.0)
MCV: 85 fL (ref 79.5–101.0)
MONO#: 1.2 10*3/uL — ABNORMAL HIGH (ref 0.1–0.9)
MONO%: 9.5 % (ref 0.0–14.0)
NEUT%: 72.6 % (ref 38.4–76.8)
NEUTROS ABS: 8.8 10*3/uL — AB (ref 1.5–6.5)
Platelets: 257 10*3/uL (ref 145–400)
RBC: 4.37 10*6/uL (ref 3.70–5.45)
RDW: 14.2 % (ref 11.2–14.5)
WBC: 12.1 10*3/uL — AB (ref 3.9–10.3)

## 2017-01-06 LAB — LACTATE DEHYDROGENASE: LDH: 201 U/L (ref 125–245)

## 2017-01-06 NOTE — Progress Notes (Signed)
INDICATION:  Brief examination was performed. ENT: adequate airway clearance Heart: regular rate and rhythm.No Murmurs Lungs: clear to auscultation, no wheezes, normal respiratory effort  Bone Marrow Biopsy and Aspiration Procedure Note   Informed consent was obtained and potential risks including bleeding, infection and pain were reviewed with the patient.   [name] name, date of birth, identification, consent and allergies were verified prior to the start of procedure and time out was performed.  The right posterior iliac crest was chosen as the site of biopsy.  The skin was prepped with chloraprep solution.   5 cc of 2% lidocaine was used to provide local anaesthesia.   10 cc of bone marrow aspirate was obtained followed by 1 inch biopsy.  Pressure was applied to the biopsy site and bandage was placed over the biopsy site. Patient was made to lie on her back for 15 mins prior to discharge.  The procedure was tolerated well. COMPLICATIONS: None BLOOD LOSS: none The patient was discharged home in stable condition with a 1 week follow up to review results. She was provided with post bone marrow biopsy instructions and instructed to call if there was any bleeding or worsening pain.  Specimens sent for flow cytometry, cytogenetics and additional studies.  Signed Scot Dock, NP  Attending Note  I personally saw and examined Kristen Escobar. I supervised the entire procedure from the start to finish Complications none blood loss none  Signed Lindi Adie, Tyler Deis, MD

## 2017-01-06 NOTE — Progress Notes (Signed)
Received pending medication status key from [employer] for Lidocaine-Prilocaine cream.  Submitted request via Cover My Meds using key provided.  Tera Helper (Key: [facility])   Your information has been submitted to [place] [employer] Part D. [employer] will review the request and will issue a decision, typically within 1-3 days from your submission. You can check the updated outcome later by reopening this request. If [employer] has not responded in 1-3 days or if you have any questions about your ePA request, please contact [place] [employer] Part D at [phone]. If you think there may be a problem with your PA request, use our live chat feature at the bottom right.

## 2017-01-06 NOTE — Assessment & Plan Note (Addendum)
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 positive for LCA, CD20, CD79a PAX-5, BCL-6 and weak cytoplasmic kappa associated with patchy weak positivity for BCL-2 and CD10, Ki-67 40%  Pathology counseling: I discussed pathology report in great detail with the patient explaining the difference between different types of lymphomas and steps in the treatment of diffuse large B cell lymphoma. Assess for bleed that the seventh of lymphoma can be cured with chemotherapy.  Recommendation: 1. Bone marrow biopsy done today for completion of staging 2. port placement 3. Echocardiogram 4. Chemotherapy class 5. Start chemotherapy with R CHOP 6 cycles  Chemotherapy counseling: I discussed risks and benefits of chemotherapy including the risk of hair loss, nausea vomiting, fatigue, loss of taste, Rituxan related first infusion reactions, chronic B-cell depletion causing frequent infections, cytopenias especially anemia and neutropenia. We will advance to Neulasta with each of her chemotherapy treatments.  Labs especially hepatitis B and C and HIV will be obtained for completion.

## 2017-01-06 NOTE — Progress Notes (Signed)
Patient Care Team: Marletta Lor, MD as PCP - General Marcy Panning, MD as Consulting Physician (Internal Medicine) Thea Silversmith, MD (Inactive) as Consulting Physician (Radiation Oncology)  DIAGNOSIS:  Encounter Diagnosis  Name Primary?  . 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 positive for LCA, CD20, CD79a PAX-5, BCL-6 and weak cytoplasmic kappa associated with patchy weak positivity for BCL-2 and CD10, Ki-67 40%       CHIEF COMPLIANT:  Follow-up of diffuse large B-cell lymphoma  INTERVAL HISTORY: Kristen Escobar is a  71 year old with a newly diagnosed diffuse large B-cell lymphoma who is here today accompanied by her family to discuss a treatment plan. She just underwent a bone marrow biopsy. She is slightly anxious but otherwise doing well. She continues to have difficulty with the throat and has a gurgling noise related to recently performed biopsies. She has difficulty with secretions and has some vocal cord dysfunction.  REVIEW OF SYSTEMS:   Constitutional: Denies fevers, chills or abnormal weight loss Eyes: Denies  blurriness of vision Ears, nose, mouth, throat, and face:  Vocal cord dysfunction with secretions and gurgling noise Respiratory: Denies cough, dyspnea or wheezes Cardiovascular: Denies palpitation, chest discomfort Gastrointestinal:  Denies nausea, heartburn or change in bowel habits Skin: Denies abnormal skin rashes Lymphatics: Denies new lymphadenopathy or easy bruising Neurological:Denies numbness, tingling or new weaknesses Behavioral/Psych: Mood is stable, no new changes  Extremities: No lower extremity edema All other systems were reviewed with the patient and are negative.  I have reviewed the past medical history, past surgical history, social history and family history with the patient and they are unchanged from previous note.  ALLERGIES:  has No Known Allergies.  MEDICATIONS:  Current Outpatient Prescriptions  Medication Sig Dispense Refill  . allopurinol (ZYLOPRIM) 300 MG tablet Take 0.5 tablets (150 mg total) by mouth daily. 30 tablet 3  . allopurinol (ZYLOPRIM) 300 MG tablet Take 1 tablet (300 mg total) by mouth daily. 30 tablet 3  . atorvastatin (LIPITOR) 10 MG tablet TAKE 1 TABLET EVERY DAY 90 tablet 3  . cephALEXin (KEFLEX) 500 MG capsule Take 1 capsule (500 mg total) by mouth 3 (three) times daily. 15 capsule 0  . HYDROcodone-acetaminophen (HYCET) 7.5-325 mg/15 ml solution Take 15 mLs by mouth 4 (four) times daily as needed for moderate pain. 480 mL 0  . HYDROcodone-acetaminophen (NORCO) 7.5-325 MG tablet Take 1 tablet by mouth every 6 (six) hours as needed for moderate pain. 20 tablet 0  . ibuprofen (ADVIL,MOTRIN) 200 MG tablet Take 400 mg by mouth every 8 (eight) hours as  needed for mild pain or moderate pain (as needed).     Marland Kitchen levothyroxine (SYNTHROID, LEVOTHROID) 50 MCG tablet TAKE 1 TABLET BY MOUTH DAILY 90 tablet 1  . lidocaine (XYLOCAINE) 2 % solution Use as directed 20 mLs in the mouth or throat every 3 (three) hours as needed for mouth pain. 100 mL 3  .  lidocaine-prilocaine (EMLA) cream Apply to affected area once 30 g 3  . LORazepam (ATIVAN) 0.5 MG tablet Take 1 tablet (0.5 mg total) by mouth every 6 (six) hours as needed (Nausea or vomiting). 30 tablet 0  . losartan-hydrochlorothiazide (HYZAAR) 100-25 MG tablet TAKE 1 TABLET BY MOUTH EVERY DAY 90 tablet 1  . omeprazole (PRILOSEC) 20 MG capsule TAKE 1 CAPSULE BY MOUTH EVERY DAY 30 capsule 3  . ondansetron (ZOFRAN) 8 MG tablet Take 1 tablet (8 mg total) by mouth 2 (two) times daily as needed for refractory nausea / vomiting. Start on day 3 after cyclophosphamide chemotherapy. 30 tablet 1  . predniSONE (DELTASONE) 20 MG tablet Take 3 tablets (60 mg total) by mouth daily. Take on days 1-5 of chemotherapy. 90 tablet 0  . prochlorperazine (COMPAZINE) 10 MG tablet Take 1 tablet (10 mg total) by mouth every 6 (six) hours as needed (Nausea or vomiting). 30 tablet 6  . promethazine (PHENERGAN) 25 MG suppository Place 1 suppository (25 mg total) rectally every 6 (six) hours as needed for nausea or vomiting. 12 suppository 1  . sucralfate (CARAFATE) 1 GM/10ML suspension Take 10 mLs (1 g total) by mouth 4 (four) times daily -  with meals and at bedtime. 420 mL 3   No current facility-administered medications for this visit.     PHYSICAL EXAMINATION: ECOG PERFORMANCE STATUS: 1 - Symptomatic but completely ambulatory  There were no vitals filed for this visit. There were no vitals filed for this visit.  GENERAL:alert, no distress and comfortable SKIN: skin color, texture, turgor are normal, no rashes or significant lesions EYES: normal, Conjunctiva are pink and non-injected, sclera clear OROPHARYNX:no exudate, no erythema and lips, buccal mucosa, and tongue normal  NECK: supple, thyroid normal size, non-tender, without nodularity LYMPH:  no palpable lymphadenopathy in the cervical, axillary or inguinal LUNGS: clear to auscultation and percussion with normal breathing effort HEART: regular rate & rhythm  and no murmurs and no lower extremity edema ABDOMEN:abdomen soft, non-tender and normal bowel sounds MUSCULOSKELETAL:no cyanosis of digits and no clubbing  NEURO: alert & oriented x 3 with fluent speech, no focal motor/sensory deficits EXTREMITIES: No lower extremity edema  LABORATORY DATA:  I have reviewed the data as listed   Chemistry      Component Value Date/Time   NA 140 01/06/2017 0908   K 3.8 01/06/2017 0908   CL 101 12/24/2016 1133   CL 105 03/10/2012 0859   CO2 26 01/06/2017 0908   BUN 28.7 (H) 01/06/2017 0908   CREATININE 1.5 (H) 01/06/2017 0908      Component Value Date/Time   CALCIUM 9.5 01/06/2017 0908   ALKPHOS 55 01/06/2017 0908   AST 19 01/06/2017 0908   ALT 25 01/06/2017 0908   BILITOT 0.33 01/06/2017 0908       Lab Results  Component Value Date   WBC 12.1 (H) 01/06/2017   HGB 12.4 01/06/2017   HCT 37.2 01/06/2017   MCV 85.0 01/06/2017   PLT 257 01/06/2017   NEUTROABS 8.8 (H) 01/06/2017    ASSESSMENT & PLAN:  Diffuse large B cell lymphoma ([facility]) 12/26/2016 High-grade Diffuse large B cell  lymphoma diagnosed on thyroid biopsies. positive for LCA, CD20, CD79a PAX-5, BCL-6 and weak cytoplasmic kappa associated with patchy weak positivity for BCL-2 and CD10, Ki-67 40%  Pathology counseling: I discussed pathology report in great detail with the patient explaining the difference between different types of lymphomas and steps in the treatment of diffuse large B cell lymphoma. Assess for bleed that the seventh of lymphoma can be cured with chemotherapy.  we also discussed the prognostic classification of revised IPI score. LDH has been ordered today.  Recommendation: 1. Bone marrow biopsy done today for completion of staging 2. port placement 3. Echocardiogram 4. Chemotherapy class 5. Start chemotherapy with R CHOP 6 cycles  Chemotherapy counseling: I discussed risks and benefits of chemotherapy including the risk of hair loss, nausea vomiting, fatigue,  loss of taste, Rituxan related first infusion reactions, chronic B-cell depletion causing frequent infections, cytopenias especially anemia and neutropenia. We will advance to Neulasta with each of her chemotherapy treatments.  Labs especially hepatitis B and C and HIV will be obtained for completion.    I spent 25 minutes talking to the patient of which more than half was spent in counseling and coordination of care.  No orders of the defined types were placed in this encounter.  The patient has a good understanding of the overall plan. she agrees with it. she will call with any problems that may develop before the next visit here.   Rulon Eisenmenger, MD 01/06/17

## 2017-01-06 NOTE — Patient Instructions (Signed)
Bone Marrow Aspiration and Bone Marrow Biopsy, Adult, Care After This sheet gives you information about how to care for yourself after your procedure. Your health care provider may also give you more specific instructions. If you have problems or questions, contact your health care provider. What can I expect after the procedure? After the procedure, it is common to have:  Mild pain and tenderness.  Swelling.  Bruising.  Follow these instructions at home:  Take over-the-counter or prescription medicines only as told by your health care provider.  Do not take baths, swim, or use a hot tub until your health care provider approves. Ask if you can take a shower or have a sponge bath.  Follow instructions from your health care provider about how to take care of the puncture site. Make sure you: ? Wash your hands with soap and water before you change your bandage (dressing). If soap and water are not available, use hand sanitizer. ? Change your dressing as told by your health care provider.  Check your puncture siteevery day for signs of infection. Check for: ? More redness, swelling, or pain. ? More fluid or blood. ? Warmth. ? Pus or a bad smell.  Return to your normal activities as told by your health care provider. Ask your health care provider what activities are safe for you.  Do not drive for 24 hours if you were given a medicine to help you relax (sedative).  Keep all follow-up visits as told by your health care provider. This is important. Contact a health care provider if:  You have more redness, swelling, or pain around the puncture site.  You have more fluid or blood coming from the puncture site.  Your puncture site feels warm to the touch.  You have pus or a bad smell coming from the puncture site.  You have a fever.  Your pain is not controlled with medicine. This information is not intended to replace advice given to you by your health care provider. Make sure  you discuss any questions you have with your health care provider. Document Released: 10/25/2004 Document Revised: 10/26/2015 Document Reviewed: 09/19/2015 Elsevier Interactive Patient Education  2018 [employer].

## 2017-01-07 ENCOUNTER — Ambulatory Visit (HOSPITAL_COMMUNITY)
Admission: RE | Admit: 2017-01-07 | Discharge: 2017-01-07 | Disposition: A | Discharge status: Home / Self Care | Payer: Medicare Other | Source: Ambulatory Visit | Attending: Hematology and Oncology | Admitting: Hematology and Oncology

## 2017-01-07 ENCOUNTER — Encounter: Payer: Self-pay | Admitting: Hematology and Oncology

## 2017-01-07 DIAGNOSIS — Z79899 Other long term (current) drug therapy: Secondary | ICD-10-CM

## 2017-01-07 DIAGNOSIS — I5030 Unspecified diastolic (congestive) heart failure: Secondary | ICD-10-CM | POA: Diagnosis not present

## 2017-01-07 DIAGNOSIS — Z5181 Encounter for therapeutic drug level monitoring: Secondary | ICD-10-CM | POA: Diagnosis not present

## 2017-01-07 DIAGNOSIS — I420 Dilated cardiomyopathy: Secondary | ICD-10-CM | POA: Diagnosis not present

## 2017-01-07 DIAGNOSIS — E041 Nontoxic single thyroid nodule: Secondary | ICD-10-CM | POA: Diagnosis not present

## 2017-01-07 LAB — HEPATITIS C ANTIBODY: Hep C Virus Ab: <0.1 s/co ratio (ref 0.0–0.9)

## 2017-01-07 LAB — HEPATITIS B CORE ANTIBODY, TOTAL: Hep B Core Ab, Tot: NEGATIVE

## 2017-01-07 LAB — HEPATITIS B SURFACE ANTIGEN: HEP B S AG: NEGATIVE

## 2017-01-07 LAB — HIV ANTIBODY (ROUTINE TESTING W REFLEX): HIV Screen 4th Generation wRfx: NONREACTIVE

## 2017-01-07 NOTE — Progress Notes (Signed)
  Echocardiogram 2D Echocardiogram has been performed.  Kristen Escobar 01/07/2017, 9:03 AM

## 2017-01-07 NOTE — Progress Notes (Signed)
Received PA determination from [facility] for Lidocaine-Prilocaine cream.  PA approved 10/08/16-04/06/17.  Called [facility]([place]) to advise of approval.

## 2017-01-08 ENCOUNTER — Encounter: Payer: Self-pay | Admitting: Internal Medicine

## 2017-01-08 ENCOUNTER — Ambulatory Visit (INDEPENDENT_AMBULATORY_CARE_PROVIDER_SITE_OTHER): Payer: Medicare Other

## 2017-01-08 DIAGNOSIS — Z23 Encounter for immunization: Secondary | ICD-10-CM

## 2017-01-08 NOTE — Assessment & Plan Note (Deleted)
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 positive for LCA, CD20, CD79a PAX-5, BCL-6 and weak cytoplasmic kappa associated with patchy weak positivity for BCL-2 and CD10, Ki-67 40%  Bone Marrow Biopsy:  Revised IPI score: 1 (good Prognosis) Predicted 4 year PFS: 80% Est OS: 79%  LDH, hepatitis B, hepatitis C, HIV normal Plan: Cycle 1 day 1R CHOP Return to clinic in one week for toxicity check Labs were reviewed Antiemetics were reviewed

## 2017-01-09 ENCOUNTER — Ambulatory Visit: Payer: Medicare Other

## 2017-01-09 ENCOUNTER — Other Ambulatory Visit: Payer: Medicare Other

## 2017-01-09 ENCOUNTER — Ambulatory Visit: Payer: Medicare Other | Admitting: Hematology and Oncology

## 2017-01-09 ENCOUNTER — Telehealth: Payer: Self-pay

## 2017-01-09 NOTE — Progress Notes (Deleted)
Patient Care Team: Marletta Lor, MD as PCP - General Marcy Panning, MD as Consulting Physician (Internal Medicine) Thea Silversmith, MD (Inactive) as Consulting Physician (Radiation Oncology)  DIAGNOSIS:  Encounter Diagnosis  Name Primary?  . Malignant neoplasm of upper-outer quadrant of right breast in female, estrogen receptor positive ([place])     SUMMARY OF ONCOLOGIC HISTORY:   Breast cancer of upper-outer quadrant of right female breast ([place])   04/22/2009 - 03/27/2010 Anti-estrogen oral therapy    Neoadjuvant antiestrogen therapy with tamoxifen for 10 months      03/28/2010 Surgery    Right breast lumpectomy invasive ductal-lobular carcinoma intermediate grade 0.5 cm, 4 SLN negative, reexcision margins clear: ER 95% PR 95% HER-2 negative Ki-67 8%      06/03/2010 - 07/12/2010 Radiation Therapy    Radiation therapy to lumpectomy site ? 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 positive for LCA, CD20, CD79a PAX-5, BCL-6 and weak cytoplasmic kappa associated with patchy weak positivity for BCL-2 and CD10, Ki-67 40%       CHIEF COMPLIANT: Follow-up to review bone marrow biopsy results and to start first cycle of R CHOP  INTERVAL HISTORY: Shanita Kanan is a 71 year old with above-mentioned history of diffuse large B-cell lymphoma who is here to review the bone marrow biopsy results and to start her first cycle of chemotherapy with R CHOP. She had done well from the bone marrow biopsy and does not have any pain or discomfort. She continues to have discomfort in the throat related to prior biopsies. She is anxious to get started with the chemotherapy.  REVIEW OF SYSTEMS:   Constitutional:  Denies fevers, chills or abnormal weight loss Eyes: Denies blurriness of vision Ears, nose, mouth, throat, and face: Denies mucositis or sore throat Respiratory: Denies cough, dyspnea or wheezes Cardiovascular: Denies palpitation, chest discomfort Gastrointestinal:  Denies nausea, heartburn or change in bowel habits Skin: Denies abnormal skin rashes Lymphatics: Denies new lymphadenopathy or easy bruising Neurological:Denies numbness, tingling or new weaknesses Behavioral/Psych: Mood is stable, no new changes  Extremities: No lower extremity edema All other systems were reviewed with the patient and are negative.  I have reviewed the past medical history, past surgical history, social history and family history with the patient and they are unchanged from previous note.  ALLERGIES:  has No Known Allergies.  MEDICATIONS:  Current Outpatient Prescriptions  Medication Sig Dispense Refill  . allopurinol (ZYLOPRIM) 300 MG tablet Take 0.5 tablets (150 mg total) by mouth daily. 30 tablet 3  . allopurinol (ZYLOPRIM) 300 MG tablet Take 1 tablet (300 mg total) by mouth daily. 30 tablet 3  . atorvastatin (LIPITOR) 10 MG tablet TAKE 1 TABLET EVERY DAY 90 tablet 3  . cephALEXin (KEFLEX) 500 MG capsule Take 1 capsule (500 mg total) by mouth 3 (three) times daily. 15 capsule 0  . HYDROcodone-acetaminophen (HYCET) 7.5-325 mg/15 ml solution Take 15 mLs by mouth 4 (four) times daily as needed for moderate pain. 480 mL 0  . HYDROcodone-acetaminophen (NORCO) 7.5-325 MG tablet Take 1 tablet by mouth every 6 (six) hours as needed for moderate pain. 20 tablet 0  . ibuprofen (ADVIL,MOTRIN) 200 MG tablet Take  400 mg by mouth every 8 (eight) hours as needed for mild pain or moderate pain (as needed).     Marland Kitchen levothyroxine (SYNTHROID, LEVOTHROID) 50 MCG tablet TAKE 1 TABLET BY MOUTH DAILY 90 tablet 1  . lidocaine (XYLOCAINE) 2 % solution Use as directed 20 mLs in the mouth or throat every 3 (three) hours as needed for  mouth pain. 100 mL 3  . lidocaine-prilocaine (EMLA) cream Apply to affected area once 30 g 3  . LORazepam (ATIVAN) 0.5 MG tablet Take 1 tablet (0.5 mg total) by mouth every 6 (six) hours as needed (Nausea or vomiting). 30 tablet 0  . losartan-hydrochlorothiazide (HYZAAR) 100-25 MG tablet TAKE 1 TABLET BY MOUTH EVERY DAY 90 tablet 1  . omeprazole (PRILOSEC) 20 MG capsule TAKE 1 CAPSULE BY MOUTH EVERY DAY 30 capsule 3  . ondansetron (ZOFRAN) 8 MG tablet Take 1 tablet (8 mg total) by mouth 2 (two) times daily as needed for refractory nausea / vomiting. Start on day 3 after cyclophosphamide chemotherapy. 30 tablet 1  . predniSONE (DELTASONE) 20 MG tablet Take 3 tablets (60 mg total) by mouth daily. Take on days 1-5 of chemotherapy. 90 tablet 0  . prochlorperazine (COMPAZINE) 10 MG tablet Take 1 tablet (10 mg total) by mouth every 6 (six) hours as needed (Nausea or vomiting). 30 tablet 6  . promethazine (PHENERGAN) 25 MG suppository Place 1 suppository (25 mg total) rectally every 6 (six) hours as needed for nausea or vomiting. 12 suppository 1  . sucralfate (CARAFATE) 1 GM/10ML suspension Take 10 mLs (1 g total) by mouth 4 (four) times daily -  with meals and at bedtime. 420 mL 3   No current facility-administered medications for this visit.     PHYSICAL EXAMINATION: ECOG PERFORMANCE STATUS: 1 - Symptomatic but completely ambulatory  There were no vitals filed for this visit. There were no vitals filed for this visit.  GENERAL:alert, no distress and comfortable SKIN: skin color, texture, turgor are normal, no rashes or significant lesions EYES: normal, Conjunctiva are pink and non-injected, sclera clear OROPHARYNX:no exudate, no erythema and lips, buccal mucosa, and tongue normal  NECK: supple, thyroid normal size, non-tender, without nodularity LYMPH:  no palpable lymphadenopathy in the cervical, axillary or inguinal LUNGS: clear to auscultation and percussion with normal breathing  effort HEART: regular rate & rhythm and no murmurs and no lower extremity edema ABDOMEN:abdomen soft, non-tender and normal bowel sounds MUSCULOSKELETAL:no cyanosis of digits and no clubbing  NEURO: alert & oriented x 3 with fluent speech, no focal motor/sensory deficits EXTREMITIES: No lower extremity edema  LABORATORY DATA:  I have reviewed the data as listed   Chemistry      Component Value Date/Time   NA 140 01/06/2017 0908   K 3.8 01/06/2017 0908   CL 101 12/24/2016 1133   CL 105 03/10/2012 0859   CO2 26 01/06/2017 0908   BUN 28.7 (H) 01/06/2017 0908   CREATININE 1.5 (H) 01/06/2017 0908      Component Value Date/Time   CALCIUM 9.5 01/06/2017 0908   ALKPHOS 55 01/06/2017 0908   AST 19 01/06/2017 0908   ALT 25 01/06/2017 0908   BILITOT 0.33 01/06/2017 0908       Lab Results  Component Value Date   WBC 12.1 (H) 01/06/2017   HGB 12.4 01/06/2017   HCT 37.2 01/06/2017   MCV 85.0 01/06/2017   PLT 257 01/06/2017   NEUTROABS 8.8 (H) 01/06/2017    ASSESSMENT & PLAN:  Breast cancer of  upper-outer quadrant of right female breast 12/26/2016 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 positive for LCA, CD20, CD79a PAX-5, BCL-6 and weak cytoplasmic kappa associated with patchy weak positivity for BCL-2 and CD10, Ki-67 40% Bone Marrow Biopsy 01/06/2017: Slightly hypercellular bone marrow, numerous small lymphoid aggregates present (these are not diffuse large B cells. These could be SLL)  Revised IPI score: 1 (good Prognosis) Predicted 4 year PFS: 80% Est OS: 79%  LDH, hepatitis B, hepatitis C, HIV normal Plan: Cycle 1 day 1R CHOP We will be monitoring her closely for toxicities. Labs were reviewed Antiemetics were reviewed Patient fully understands the risks and benefits of current treatment including the risk of first infusion reaction of Rituxan.  Return to clinic in one week for toxicity check  I spent 25 minutes talking to the patient of  which more than half was spent in counseling and coordination of care.  No orders of the defined types were placed in this encounter.  The patient has a good understanding of the overall plan. she agrees with it. she will call with any problems that may develop before the next visit here.   Rulon Eisenmenger, MD 01/09/17

## 2017-01-09 NOTE — Telephone Encounter (Signed)
Called pt per Dr.Gudena request to notify pt that her bone marrow biopsy result was negative. Pt was appreciative of call and wanted to obtain a copy of her upcoming schedule in the next few weeks. Will mail pt a copy of calendar of appts. Pt verbalized understanding.

## 2017-01-13 ENCOUNTER — Other Ambulatory Visit: Payer: Self-pay | Admitting: Radiology

## 2017-01-14 ENCOUNTER — Encounter (HOSPITAL_COMMUNITY): Payer: Self-pay

## 2017-01-14 ENCOUNTER — Ambulatory Visit (HOSPITAL_COMMUNITY)
Admission: RE | Admit: 2017-01-14 | Discharge: 2017-01-14 | Disposition: A | Discharge status: Home / Self Care | Payer: Medicare Other | Source: Ambulatory Visit | Attending: Hematology and Oncology | Admitting: Hematology and Oncology

## 2017-01-14 ENCOUNTER — Other Ambulatory Visit: Payer: Self-pay | Admitting: Hematology and Oncology

## 2017-01-14 DIAGNOSIS — Z923 Personal history of irradiation: Secondary | ICD-10-CM | POA: Diagnosis not present

## 2017-01-14 DIAGNOSIS — I739 Peripheral vascular disease, unspecified: Secondary | ICD-10-CM | POA: Insufficient documentation

## 2017-01-14 DIAGNOSIS — Z853 Personal history of malignant neoplasm of breast: Secondary | ICD-10-CM | POA: Insufficient documentation

## 2017-01-14 DIAGNOSIS — C50411 Malignant neoplasm of upper-outer quadrant of right female breast: Secondary | ICD-10-CM

## 2017-01-14 DIAGNOSIS — K219 Gastro-esophageal reflux disease without esophagitis: Secondary | ICD-10-CM | POA: Insufficient documentation

## 2017-01-14 DIAGNOSIS — Z79899 Other long term (current) drug therapy: Secondary | ICD-10-CM | POA: Insufficient documentation

## 2017-01-14 DIAGNOSIS — E039 Hypothyroidism, unspecified: Secondary | ICD-10-CM | POA: Diagnosis not present

## 2017-01-14 DIAGNOSIS — C8330 Diffuse large B-cell lymphoma, unspecified site: Secondary | ICD-10-CM | POA: Insufficient documentation

## 2017-01-14 DIAGNOSIS — Z5111 Encounter for antineoplastic chemotherapy: Secondary | ICD-10-CM | POA: Diagnosis not present

## 2017-01-14 DIAGNOSIS — Z96643 Presence of artificial hip joint, bilateral: Secondary | ICD-10-CM | POA: Diagnosis not present

## 2017-01-14 DIAGNOSIS — I10 Essential (primary) hypertension: Secondary | ICD-10-CM | POA: Insufficient documentation

## 2017-01-14 DIAGNOSIS — Z170 Estrogen receptor positive status [ER+]: Secondary | ICD-10-CM

## 2017-01-14 DIAGNOSIS — Z89022 Acquired absence of left finger(s): Secondary | ICD-10-CM | POA: Insufficient documentation

## 2017-01-14 DIAGNOSIS — Z8572 Personal history of non-Hodgkin lymphomas: Secondary | ICD-10-CM | POA: Diagnosis not present

## 2017-01-14 DIAGNOSIS — Z89021 Acquired absence of right finger(s): Secondary | ICD-10-CM | POA: Diagnosis not present

## 2017-01-14 HISTORY — PX: IR FLUORO GUIDE PORT INSERTION RIGHT: IMG5741

## 2017-01-14 HISTORY — PX: IR US GUIDE VASC ACCESS RIGHT: IMG2390

## 2017-01-14 LAB — CBC
HCT: 37.5 % (ref 36.0–46.0)
Hemoglobin: 12.8 g/dL (ref 12.0–15.0)
MCH: 28.4 pg (ref 26.0–34.0)
MCHC: 34.1 g/dL (ref 30.0–36.0)
MCV: 83.3 fL (ref 78.0–100.0)
PLATELETS: 304 10*3/uL (ref 150–400)
RBC: 4.5 MIL/uL (ref 3.87–5.11)
RDW: 13.9 % (ref 11.5–15.5)
WBC: 11.9 10*3/uL — AB (ref 4.0–10.5)

## 2017-01-14 LAB — APTT: aPTT: 24 seconds (ref 24–36)

## 2017-01-14 LAB — PROTIME-INR
INR: 1.02
PROTHROMBIN TIME: 13.3 s (ref 11.4–15.2)

## 2017-01-14 MED ORDER — CEFAZOLIN SODIUM-DEXTROSE 2-4 GM/100ML-% IV SOLN
2.0000 g | Freq: Once | INTRAVENOUS | Status: AC
  Administered 2017-01-14: 2 g via INTRAVENOUS

## 2017-01-14 MED ORDER — FENTANYL CITRATE (PF) 100 MCG/2ML IJ SOLN
INTRAMUSCULAR | Status: AC
  Filled 2017-01-14: qty 4

## 2017-01-14 MED ORDER — MIDAZOLAM HCL 2 MG/2ML IJ SOLN
INTRAMUSCULAR | Status: AC
  Filled 2017-01-14: qty 4

## 2017-01-14 MED ORDER — CEFAZOLIN SODIUM-DEXTROSE 2-4 GM/100ML-% IV SOLN
INTRAVENOUS | Status: AC
  Filled 2017-01-14: qty 100

## 2017-01-14 MED ORDER — HEPARIN SOD (PORK) LOCK FLUSH 100 UNIT/ML IV SOLN
INTRAVENOUS | Status: AC
  Filled 2017-01-14: qty 5

## 2017-01-14 MED ORDER — SODIUM CHLORIDE 0.9 % IV SOLN
INTRAVENOUS | Status: DC
  Administered 2017-01-14: 08:00:00 via INTRAVENOUS

## 2017-01-14 MED ORDER — FENTANYL CITRATE (PF) 100 MCG/2ML IJ SOLN
INTRAMUSCULAR | Status: AC | PRN
  Administered 2017-01-14 (×2): 50 ug via INTRAVENOUS

## 2017-01-14 MED ORDER — LIDOCAINE-EPINEPHRINE (PF) 2 %-1:200000 IJ SOLN
INTRAMUSCULAR | Status: AC | PRN
  Administered 2017-01-14: 15 mL

## 2017-01-14 MED ORDER — LIDOCAINE-EPINEPHRINE (PF) 2 %-1:200000 IJ SOLN
INTRAMUSCULAR | Status: AC
  Filled 2017-01-14: qty 20

## 2017-01-14 MED ORDER — MIDAZOLAM HCL 2 MG/2ML IJ SOLN
INTRAMUSCULAR | Status: AC | PRN
  Administered 2017-01-14 (×2): 1 mg via INTRAVENOUS

## 2017-01-14 MED ORDER — CEFAZOLIN (ANCEF) 1 G IV SOLR: 2.0000 g | INTRAVENOUS | Status: DC

## 2017-01-14 NOTE — Procedures (Signed)
Pre Procedure Dx: Lymphoma Post Procedural Dx: Same  Successful placement of right IJ approach port-a-cath with tip at the superior caval atrial junction. The catheter is ready for immediate use.  Estimated Blood Loss: Minimal  Complications: None immediate.  Jay Reyden Smith, MD Pager #: [phone]   

## 2017-01-14 NOTE — H&P (Signed)
Referring Physician(s): Nicholas Lose  Supervising Physician: Sandi Mariscal  Patient Status:  [facility]  Chief Complaint: "I'm getting a port a cath"  Subjective: Pt familiar to IR service from prior left cervical lymph node biopsy 11/21/16 and left thyroid biopsy on 12/17/16. She has a history of right breast carcinoma 2011, status post surgery, radiation therapy and antiestrogen treatment. She has a hx of recently diagnosed diffuse large B cell lymphoma and presents today for port a cath placement for chemotherapy. She denies fever, HA,CP, dyspnea, cough, abd/back pain,N/V or bleeding. She does have dysphagia.  Past Medical History:  Diagnosis Date  . Arthritis   . Cancer ([place]) 03/2009   breast- rt  . GERD (gastroesophageal reflux disease)   . History of radiation therapy 07/12/10,completed   right breast 60 Gy x30 fx  . Hypertension   . Hypothyroidism   . Obesity   . Peripheral vascular disease ([place]) 1995   PT DEVELOPED CIRCULATION PROBLEMS IN BOTH HANDS AND GANGRENE OF BOTH INDEX FINGERS--REQUIRING AMPUTATION OF THE INDEX FINGERS AND VASCULAR SURGERY.  PT TOLD HER PROBLEMS RELATED TO SMOKING.   NO OTHER PROBLEMS SINCE.  Marland Kitchen Pneumonia   . Thyroid mass    Past Surgical History:  Procedure Laterality Date  . AMPUTATION     partial amputation of both index fingers  . BREAST SURGERY  2011   lumpectomy with node sampling- RIGHT  . COLONOSCOPY    . ESOPHAGOGASTRODUODENOSCOPY    . EXCISION MASS NECK Left 12/26/2016   Procedure: EXCISION MASS NECK;  Surgeon: Izora Gala, MD;  Location: [facility];  Service: ENT;  Laterality: Left;  open excision of thyroid mass left side with frozen section  . HAND SURGERY Bilateral 1995   Amputaed pointer fingers bilaterally  . TOTAL HIP ARTHROPLASTY  12/16/2011   right hip  . TOTAL HIP ARTHROPLASTY  01/20/2012   Procedure: TOTAL HIP ARTHROPLASTY ANTERIOR APPROACH;  Surgeon: Mauri Pole, MD;  Location: [facility];  Service: Orthopedics;  Laterality: Left;  .  TUBAL LIGATION    . VASCULAR SURGERY     both hands      Allergies: Patient has no known allergies.  Medications: Prior to Admission medications   Medication Sig Start Date End Date Taking? Authorizing Provider  allopurinol (ZYLOPRIM) 300 MG tablet Take 1 tablet (300 mg total) by mouth daily. 01/01/17  Yes Nicholas Lose, MD  atorvastatin (LIPITOR) 10 MG tablet TAKE 1 TABLET EVERY DAY 10/06/16  Yes Marletta Lor, MD  cephALEXin (KEFLEX) 500 MG capsule Take 1 capsule (500 mg total) by mouth 3 (three) times daily. 12/26/16  Yes Izora Gala, MD  HYDROcodone-acetaminophen (NORCO) 7.5-325 MG tablet Take 1 tablet by mouth every 6 (six) hours as needed for moderate pain. 12/26/16  Yes Izora Gala, MD  ibuprofen (ADVIL,MOTRIN) 200 MG tablet Take 400 mg by mouth every 8 (eight) hours as needed for mild pain or moderate pain (as needed).    Yes [provider]  levothyroxine (SYNTHROID, LEVOTHROID) 50 MCG tablet TAKE 1 TABLET BY MOUTH DAILY 09/08/16  Yes Marletta Lor, MD  lidocaine (XYLOCAINE) 2 % solution Use as directed 20 mLs in the mouth or throat every 3 (three) hours as needed for mouth pain. 12/26/16  Yes Izora Gala, MD  losartan-hydrochlorothiazide [facility]) 100-25 MG tablet TAKE 1 TABLET BY MOUTH EVERY DAY 12/25/16  Yes Marletta Lor, MD  omeprazole (PRILOSEC) 20 MG capsule TAKE 1 CAPSULE BY MOUTH EVERY DAY 12/24/16  Yes Marletta Lor,  MD  sucralfate (CARAFATE) 1 GM/10ML suspension Take 10 mLs (1 g total) by mouth 4 (four) times daily -  with meals and at bedtime. 12/12/16  Yes Armbruster, Carlota Raspberry, MD  allopurinol (ZYLOPRIM) 300 MG tablet Take 0.5 tablets (150 mg total) by mouth daily. 01/01/17   Nicholas Lose, MD  HYDROcodone-acetaminophen (HYCET) 7.5-325 mg/15 ml solution Take 15 mLs by mouth 4 (four) times daily as needed for moderate pain. 12/26/16   Izora Gala, MD  lidocaine-prilocaine (EMLA) cream Apply to affected area once 01/01/17   Nicholas Lose, MD    LORazepam (ATIVAN) 0.5 MG tablet Take 1 tablet (0.5 mg total) by mouth every 6 (six) hours as needed (Nausea or vomiting). 01/01/17   Nicholas Lose, MD  ondansetron (ZOFRAN) 8 MG tablet Take 1 tablet (8 mg total) by mouth 2 (two) times daily as needed for refractory nausea / vomiting. Start on day 3 after cyclophosphamide chemotherapy. 01/01/17   Nicholas Lose, MD  predniSONE (DELTASONE) 20 MG tablet Take 3 tablets (60 mg total) by mouth daily. Take on days 1-5 of chemotherapy. 01/01/17   Nicholas Lose, MD  prochlorperazine (COMPAZINE) 10 MG tablet Take 1 tablet (10 mg total) by mouth every 6 (six) hours as needed (Nausea or vomiting). 01/01/17   Nicholas Lose, MD  promethazine (PHENERGAN) 25 MG suppository Place 1 suppository (25 mg total) rectally every 6 (six) hours as needed for nausea or vomiting. 12/26/16   Izora Gala, MD     Vital Signs: BP 130/88 (BP Location: Left Arm)   Pulse 81   Temp 98.5 F (36.9 C) (Oral)   Resp 20   SpO2 95%   Physical Exam awake/alert; chest- distant BS bilat with few scatt wheezes; heart- RRR; abd- obese, soft,+BS,NT; ext- no edema  Imaging: No results found.  Labs:  CBC:  Recent Labs  12/01/16 0823 12/24/16 1133 01/06/17 0908 01/14/17 0745  WBC 9.6 10.8* 12.1* 11.9*  HGB 13.0 13.4 12.4 12.8  HCT 38.3 40.9 37.2 37.5  PLT 244 262 257 304    COAGS:  Recent Labs  12/01/16 0823 01/14/17 0745  INR 0.94 1.02  APTT 25 24    BMP:  Recent Labs  03/25/16 1019 12/24/16 1133 01/06/17 0908  NA 141 139 140  K 4.1 3.3* 3.8  CL 106 101  --   CO2 25 26 26   GLUCOSE 99 112* 103  BUN 19 25* 28.7*  CALCIUM 9.0 9.5 9.5  CREATININE 1.22* 1.54* 1.5*  GFRNONAA  --  33*  --   GFRAA  --  38*  --     LIVER FUNCTION TESTS:  Recent Labs  03/25/16 1019 01/06/17 0908  BILITOT 0.6 0.33  AST 12 19  ALT 11 25  ALKPHOS 47 55  PROT 6.8 7.5  ALBUMIN 3.7 3.3*    Assessment and Plan: Pt with hx of recently diagnosed diffuse large B cell  lymphoma ;  history of right breast carcinoma 2011, status post surgery, radiation therapy and antiestrogen treatment.She presents today for port a cath placement for chemotherapy.Risks and benefits discussed with the patient/spouse including, but not limited to bleeding, infection, pneumothorax, or fibrin sheath development and need for additional procedures.All of the patient's questions were answered, patient is agreeable to proceed.Consent signed and in chart.     Electronically Signed: D. Rowe [name], PA-C 01/14/2017, 8:40 AM   I spent a total of 20 minutes at the the patient's bedside AND on the patient's hospital floor or unit, greater than 50% of  which was counseling/coordinating care for port a cath placement

## 2017-01-14 NOTE — Discharge Instructions (Signed)
Moderate Conscious Sedation, Adult, Care After °These instructions provide you with information about caring for yourself after your procedure. Your health care provider may also give you more specific instructions. Your treatment has been planned according to current medical practices, but problems sometimes occur. Call your health care provider if you have any problems or questions after your procedure. °What can I expect after the procedure? °After your procedure, it is common: °· To feel sleepy for several hours. °· To feel clumsy and have poor balance for several hours. °· To have poor judgment for several hours. °· To vomit if you eat too soon. ° °Follow these instructions at home: °For at least 24 hours after the procedure: ° °· Do not: °? Participate in activities where you could fall or become injured. °? Drive. °? Use heavy machinery. °? Drink alcohol. °? Take sleeping pills or medicines that cause drowsiness. °? Make important decisions or sign legal documents. °? Take care of children on your own. °· Rest. °Eating and drinking °· Follow the diet recommended by your health care provider. °· If you vomit: °? Drink water, juice, or soup when you can drink without vomiting. °? Make sure you have little or no nausea before eating solid foods. °General instructions °· Have a responsible adult stay with you until you are awake and alert. °· Take over-the-counter and prescription medicines only as told by your health care provider. °· If you smoke, do not smoke without supervision. °· Keep all follow-up visits as told by your health care provider. This is important. °Contact a health care provider if: °· You keep feeling nauseous or you keep vomiting. °· You feel light-headed. °· You develop a rash. °· You have a fever. °Get help right away if: °· You have trouble breathing. °This information is not intended to replace advice given to you by your health care provider. Make sure you discuss any questions you have  with your health care provider. °Document Released: 01/26/2013 Document Revised: 09/10/2015 Document Reviewed: 07/28/2015 °Elsevier Interactive Patient Education © 2018 [employer] °Implanted Port Home Guide °An implanted port is a type of central line that is placed under the skin. Central lines are used to provide IV access when treatment or nutrition needs to be given through a person’s veins. Implanted ports are used for long-term IV access. An implanted port may be placed because: °· You need IV medicine that would be irritating to the small veins in your hands or arms. °· You need long-term IV medicines, such as antibiotics. °· You need IV nutrition for a long period. °· You need frequent blood draws for lab tests. °· You need dialysis. ° °Implanted ports are usually placed in the chest area, but they can also be placed in the upper arm, the abdomen, or the leg. An implanted port has two main parts: °· Reservoir. The reservoir is round and will appear as a small, raised area under your skin. The reservoir is the part where a needle is inserted to give medicines or draw blood. °· Catheter. The catheter is a thin, flexible tube that extends from the reservoir. The catheter is placed into a large vein. Medicine that is inserted into the reservoir goes into the catheter and then into the vein. ° °How will I care for my incision site? °Do not get the incision site wet. Bathe or shower as directed by your health care provider. °How is my port accessed? °Special steps must be taken to access the port: °·   Before the port is accessed, a numbing cream can be placed on the skin. This helps numb the skin over the port site. °· Your health care provider uses a sterile technique to access the port. °? Your health care provider must put on a mask and sterile gloves. °? The skin over your port is cleaned carefully with an antiseptic and allowed to dry. °? The port is gently pinched between sterile gloves, and a needle is  inserted into the port. °· Only "non-coring" port needles should be used to access the port. Once the port is accessed, a blood return should be checked. This helps ensure that the port is in the vein and is not clogged. °· If your port needs to remain accessed for a constant infusion, a clear (transparent) bandage will be placed over the needle site. The bandage and needle will need to be changed every week, or as directed by your health care provider. °· Keep the bandage covering the needle clean and dry. Do not get it wet. Follow your health care provider’s instructions on how to take a shower or bath while the port is accessed. °· If your port does not need to stay accessed, no bandage is needed over the port. ° °What is flushing? °Flushing helps keep the port from getting clogged. Follow your health care provider’s instructions on how and when to flush the port. Ports are usually flushed with saline solution or a medicine called heparin. The need for flushing will depend on how the port is used. °· If the port is used for intermittent medicines or blood draws, the port will need to be flushed: °? After medicines have been given. °? After blood has been drawn. °? As part of routine maintenance. °· If a constant infusion is running, the port may not need to be flushed. ° °How long will my port stay implanted? °The port can stay in for as long as your health care provider thinks it is needed. When it is time for the port to come out, surgery will be done to remove it. The procedure is similar to the one performed when the port was put in. °When should I seek immediate medical care? °When you have an implanted port, you should seek immediate medical care if: °· You notice a bad smell coming from the incision site. °· You have swelling, redness, or drainage at the incision site. °· You have more swelling or pain at the port site or the surrounding area. °· You have a fever that is not controlled with  medicine. ° °This information is not intended to replace advice given to you by your health care provider. Make sure you discuss any questions you have with your health care provider. °Document Released: 04/07/2005 Document Revised: 09/13/2015 Document Reviewed: 12/13/2012 °Elsevier Interactive Patient Education © 2017 [employer]. ° °

## 2017-01-15 NOTE — Assessment & Plan Note (Signed)
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 positive for LCA, CD20, CD79a PAX-5, BCL-6 and weak cytoplasmic kappa associated with patchy weak positivity for BCL-2 and CD10, Ki-67 40%  Bone marrow biopsy negative for diffuse large B cell lymphoma but suggestive of small low-grade B cell population possibly SLL/CLL  Revised IPI score: 1, good prognosis, redictated for years PFS 80%, predicted overall survival 79% ------------------------------------------------------------------------ Current treatment: R CHOP 6 cycles today is cycle 1 day 1 Antiemetics were reviewed Chemotherapy education completed Consent obtained Labs were reviewed Hepatitis B and C negative  Return to clinic in one week for toxicity check

## 2017-01-16 ENCOUNTER — Other Ambulatory Visit (HOSPITAL_BASED_OUTPATIENT_CLINIC_OR_DEPARTMENT_OTHER): Payer: Medicare Other

## 2017-01-16 ENCOUNTER — Telehealth: Payer: Self-pay | Admitting: Hematology and Oncology

## 2017-01-16 ENCOUNTER — Ambulatory Visit: Payer: Medicare Other

## 2017-01-16 ENCOUNTER — Ambulatory Visit (HOSPITAL_BASED_OUTPATIENT_CLINIC_OR_DEPARTMENT_OTHER): Payer: Medicare Other

## 2017-01-16 ENCOUNTER — Ambulatory Visit (HOSPITAL_BASED_OUTPATIENT_CLINIC_OR_DEPARTMENT_OTHER): Payer: Medicare Other | Admitting: Hematology and Oncology

## 2017-01-16 VITALS — BP 145/39 | HR 78 | Temp 99.0°F | Resp 16

## 2017-01-16 DIAGNOSIS — C50411 Malignant neoplasm of upper-outer quadrant of right female breast: Secondary | ICD-10-CM

## 2017-01-16 DIAGNOSIS — Z170 Estrogen receptor positive status [ER+]: Secondary | ICD-10-CM | POA: Diagnosis not present

## 2017-01-16 DIAGNOSIS — Z5112 Encounter for antineoplastic immunotherapy: Secondary | ICD-10-CM

## 2017-01-16 DIAGNOSIS — Z5189 Encounter for other specified aftercare: Secondary | ICD-10-CM | POA: Diagnosis not present

## 2017-01-16 DIAGNOSIS — C8331 Diffuse large B-cell lymphoma, lymph nodes of head, face, and neck: Secondary | ICD-10-CM | POA: Diagnosis not present

## 2017-01-16 DIAGNOSIS — Z5111 Encounter for antineoplastic chemotherapy: Secondary | ICD-10-CM | POA: Diagnosis not present

## 2017-01-16 LAB — COMPREHENSIVE METABOLIC PANEL
ALT: 60 U/L — ABNORMAL HIGH (ref 0–55)
ANION GAP: 12 meq/L — AB (ref 3–11)
AST: 41 U/L — ABNORMAL HIGH (ref 5–34)
Albumin: 3.4 g/dL — ABNORMAL LOW (ref 3.5–5.0)
Alkaline Phosphatase: 59 U/L (ref 40–150)
BUN: 32.7 mg/dL — AB (ref 7.0–26.0)
CO2: 24 mEq/L (ref 22–29)
Calcium: 9.4 mg/dL (ref 8.4–10.4)
Chloride: 101 mEq/L (ref 98–109)
Creatinine: 1.4 mg/dL — ABNORMAL HIGH (ref 0.6–1.1)
EGFR: 39 mL/min/{1.73_m2} — AB (ref >90)
Glucose: 128 mg/dl (ref 70–140)
POTASSIUM: 3.1 meq/L — AB (ref 3.5–5.1)
SODIUM: 138 meq/L (ref 136–145)
TOTAL PROTEIN: 7.8 g/dL (ref 6.4–8.3)
Total Bilirubin: 0.64 mg/dL (ref 0.20–1.20)

## 2017-01-16 LAB — CBC WITH DIFFERENTIAL/PLATELET
BASO%: 0.7 % (ref 0.0–2.0)
BASOS ABS: 0.1 10*3/uL (ref 0.0–0.1)
EOS ABS: 0.2 10*3/uL (ref 0.0–0.5)
EOS%: 1.7 % (ref 0.0–7.0)
HCT: 36.4 % (ref 34.8–46.6)
HGB: 12.3 g/dL (ref 11.6–15.9)
LYMPH%: 13.2 % — AB (ref 14.0–49.7)
MCH: 28.8 pg (ref 25.1–34.0)
MCHC: 33.9 g/dL (ref 31.5–36.0)
MCV: 85.1 fL (ref 79.5–101.0)
MONO#: 0.9 10*3/uL (ref 0.1–0.9)
MONO%: 7.9 % (ref 0.0–14.0)
NEUT#: 9.1 10*3/uL — ABNORMAL HIGH (ref 1.5–6.5)
NEUT%: 76.5 % (ref 38.4–76.8)
PLATELETS: 284 10*3/uL (ref 145–400)
RBC: 4.27 10*6/uL (ref 3.70–5.45)
RDW: 14.9 % — ABNORMAL HIGH (ref 11.2–14.5)
WBC: 11.9 10*3/uL — ABNORMAL HIGH (ref 3.9–10.3)
lymph#: 1.6 10*3/uL (ref 0.9–3.3)

## 2017-01-16 MED ORDER — DOXORUBICIN HCL CHEMO IV INJECTION 2 MG/ML
50.0000 mg/m2 | Freq: Once | INTRAVENOUS | Status: AC
  Administered 2017-01-16: 108 mg via INTRAVENOUS
  Filled 2017-01-16: qty 54

## 2017-01-16 MED ORDER — ACETAMINOPHEN 325 MG PO TABS
650.0000 mg | ORAL_TABLET | Freq: Once | ORAL | Status: AC
  Administered 2017-01-16: 650 mg via ORAL

## 2017-01-16 MED ORDER — MAGIC MOUTHWASH W/LIDOCAINE: 5.0000 mL | Freq: Three times a day (TID) | ORAL | 1 refills | Status: DC | PRN

## 2017-01-16 MED ORDER — SODIUM CHLORIDE 0.9 % IV SOLN
375.0000 mg/m2 | Freq: Once | INTRAVENOUS | Status: AC
  Administered 2017-01-16: 800 mg via INTRAVENOUS
  Filled 2017-01-16: qty 30

## 2017-01-16 MED ORDER — VINCRISTINE SULFATE CHEMO INJECTION 1 MG/ML
2.0000 mg | Freq: Once | INTRAVENOUS | Status: AC
  Administered 2017-01-16: 2 mg via INTRAVENOUS
  Filled 2017-01-16: qty 2

## 2017-01-16 MED ORDER — PALONOSETRON HCL INJECTION 0.25 MG/5ML
INTRAVENOUS | Status: AC
  Filled 2017-01-16: qty 5

## 2017-01-16 MED ORDER — SODIUM CHLORIDE 0.9% FLUSH
10.0000 mL | INTRAVENOUS | Status: DC | PRN
  Administered 2017-01-16: 10 mL
  Filled 2017-01-16: qty 10

## 2017-01-16 MED ORDER — PEGFILGRASTIM 6 MG/0.6ML ~~LOC~~ PSKT
6.0000 mg | PREFILLED_SYRINGE | Freq: Once | SUBCUTANEOUS | Status: AC
  Administered 2017-01-16: 6 mg via SUBCUTANEOUS
  Filled 2017-01-16: qty 0.6

## 2017-01-16 MED ORDER — DIPHENHYDRAMINE HCL 25 MG PO CAPS
50.0000 mg | ORAL_CAPSULE | Freq: Once | ORAL | Status: AC
  Administered 2017-01-16: 50 mg via ORAL

## 2017-01-16 MED ORDER — DEXAMETHASONE SODIUM PHOSPHATE 10 MG/ML IJ SOLN
10.0000 mg | Freq: Once | INTRAMUSCULAR | Status: AC
  Administered 2017-01-16: 10 mg via INTRAVENOUS

## 2017-01-16 MED ORDER — ACETAMINOPHEN 325 MG PO TABS
ORAL_TABLET | ORAL | Status: AC
  Filled 2017-01-16: qty 2

## 2017-01-16 MED ORDER — DEXAMETHASONE SODIUM PHOSPHATE 10 MG/ML IJ SOLN
INTRAMUSCULAR | Status: AC
  Filled 2017-01-16: qty 1

## 2017-01-16 MED ORDER — SODIUM CHLORIDE 0.9 % IV SOLN
750.0000 mg/m2 | Freq: Once | INTRAVENOUS | Status: AC
  Administered 2017-01-16: 1620 mg via INTRAVENOUS
  Filled 2017-01-16: qty 81

## 2017-01-16 MED ORDER — DIPHENHYDRAMINE HCL 25 MG PO CAPS
ORAL_CAPSULE | ORAL | Status: AC
  Filled 2017-01-16: qty 2

## 2017-01-16 MED ORDER — SODIUM CHLORIDE 0.9 % IV SOLN
Freq: Once | INTRAVENOUS | Status: AC
  Administered 2017-01-16: 11:00:00 via INTRAVENOUS

## 2017-01-16 MED ORDER — HEPARIN SOD (PORK) LOCK FLUSH 100 UNIT/ML IV SOLN
500.0000 [IU] | Freq: Once | INTRAVENOUS | Status: AC | PRN
  Administered 2017-01-16: 500 [IU]
  Filled 2017-01-16: qty 5

## 2017-01-16 MED ORDER — PALONOSETRON HCL INJECTION 0.25 MG/5ML
0.2500 mg | Freq: Once | INTRAVENOUS | Status: AC
  Administered 2017-01-16: 0.25 mg via INTRAVENOUS

## 2017-01-16 NOTE — Telephone Encounter (Signed)
Spoke with patient and scheduled appts per 9/28 los. Patient is getting an updated calendar in infusion.

## 2017-01-16 NOTE — Patient Instructions (Signed)
[facility] Discharge Instructions for Patients Receiving Chemotherapy  Today you received the following chemotherapy agents:  Doxorubicin (Adriamycin), Cyclophosphamide (Cytoxan), Vincristine (Oncovorin), Rituximab (Rituxan)  To help prevent nausea and vomiting after your treatment, we encourage you to take your nausea medication as prescribed.   If you develop nausea and vomiting that is not controlled by your nausea medication, call the clinic.   BELOW ARE SYMPTOMS THAT SHOULD BE REPORTED IMMEDIATELY:  *FEVER GREATER THAN 100.5 F  *CHILLS WITH OR WITHOUT FEVER  NAUSEA AND VOMITING THAT IS NOT CONTROLLED WITH YOUR NAUSEA MEDICATION  *UNUSUAL SHORTNESS OF BREATH  *UNUSUAL BRUISING OR BLEEDING  TENDERNESS IN MOUTH AND THROAT WITH OR WITHOUT PRESENCE OF ULCERS  *URINARY PROBLEMS  *BOWEL PROBLEMS  UNUSUAL RASH Items with * indicate a potential emergency and should be followed up as soon as possible.  Feel free to call the clinic should you have any questions or concerns. The clinic phone number is [phone].  Please show the [place] at check-in to the Emergency Department and triage nurse.     Rituximab injection What is this medicine? RITUXIMAB (ri TUX i mab) is a monoclonal antibody. It is used to treat certain types of cancer like non-Hodgkin lymphoma and chronic lymphocytic leukemia. It is also used to treat rheumatoid arthritis, granulomatosis with polyangiitis (or Wegener's granulomatosis), and microscopic polyangiitis. This medicine may be used for other purposes; ask your health care provider or pharmacist if you have questions. COMMON BRAND NAME(S): Rituxan What should I tell my health care provider before I take this medicine? They need to know if you have any of these conditions: -heart disease -infection (especially a virus infection such as hepatitis B, chickenpox, cold sores, or herpes) -immune system problems -irregular  heartbeat -kidney disease -lung or breathing disease, like asthma -recently received or scheduled to receive a vaccine -an unusual or allergic reaction to rituximab, mouse proteins, other medicines, foods, dyes, or preservatives -pregnant or trying to get pregnant -breast-feeding How should I use this medicine? This medicine is for infusion into a vein. It is administered in a hospital or clinic by a specially trained health care professional. A special MedGuide will be given to you by the pharmacist with each prescription and refill. Be sure to read this information carefully each time. Talk to your pediatrician regarding the use of this medicine in children. This medicine is not approved for use in children. Overdosage: If you think you have taken too much of this medicine contact a poison control center or emergency room at once. NOTE: This medicine is only for you. Do not share this medicine with others. What if I miss a dose? It is important not to miss a dose. Call your doctor or health care professional if you are unable to keep an appointment. What may interact with this medicine? -cisplatin -other medicines for arthritis like disease modifying antirheumatic drugs or tumor necrosis factor inhibitors -live virus vaccines This list may not describe all possible interactions. Give your health care provider a list of all the medicines, herbs, non-prescription drugs, or dietary supplements you use. Also tell them if you smoke, drink alcohol, or use illegal drugs. Some items may interact with your medicine. What should I watch for while using this medicine? Your condition will be monitored carefully while you are receiving this medicine. You may need blood work done while you are taking this medicine. This medicine can cause serious allergic reactions. To reduce your risk you may need  to take medicine before treatment with this medicine. Take your medicine as directed. In some patients, this  medicine may cause a serious brain infection that may cause death. If you have any problems seeing, thinking, speaking, walking, or standing, tell your doctor right away. If you cannot reach your doctor, urgently seek other source of medical care. Call your doctor or health care professional for advice if you get a fever, chills or sore throat, or other symptoms of a cold or flu. Do not treat yourself. This drug decreases your body's ability to fight infections. Try to avoid being around people who are sick. Do not become pregnant while taking this medicine or for 12 months after stopping it. Women should inform their doctor if they wish to become pregnant or think they might be pregnant. There is a potential for serious side effects to an unborn child. Talk to your health care professional or pharmacist for more information. What side effects may I notice from receiving this medicine? Side effects that you should report to your doctor or health care professional as soon as possible: -breathing problems -chest pain -dizziness or feeling faint -fast, irregular heartbeat -low blood counts - this medicine may decrease the number of white blood cells, red blood cells and platelets. You may be at increased risk for infections and bleeding. -mouth sores -redness, blistering, peeling or loosening of the skin, including inside the mouth (this can be added for any serious or exfoliative rash that could lead to hospitalization) -signs of infection - fever or chills, cough, sore throat, pain or difficulty passing urine -signs and symptoms of kidney injury like trouble passing urine or change in the amount of urine -signs and symptoms of liver injury like dark yellow or brown urine; general ill feeling or flu-like symptoms; light-colored stools; loss of appetite; nausea; right upper belly pain; unusually weak or tired; yellowing of the eyes or skin -stomach pain -vomiting Side effects that usually do not  require medical attention (report to your doctor or health care professional if they continue or are bothersome): -headache -joint pain -muscle cramps or muscle pain This list may not describe all possible side effects. Call your doctor for medical advice about side effects. You may report side effects to FDA at 1-800-FDA-1088. Where should I keep my medicine? This drug is given in a hospital or clinic and will not be stored at home. NOTE: This sheet is a summary. It may not cover all possible information. If you have questions about this medicine, talk to your doctor, pharmacist, or health care provider.  2018 Elsevier/Gold Standard (2015-11-14 15:28:09)    Cyclophosphamide injection What is this medicine? CYCLOPHOSPHAMIDE (sye kloe FOSS fa mide) is a chemotherapy drug. It slows the growth of cancer cells. This medicine is used to treat many types of cancer like lymphoma, myeloma, leukemia, breast cancer, and ovarian cancer, to name a few. This medicine may be used for other purposes; ask your health care provider or pharmacist if you have questions. COMMON BRAND NAME(S): Cytoxan, Neosar What should I tell my health care provider before I take this medicine? They need to know if you have any of these conditions: -blood disorders -history of other chemotherapy -infection -kidney disease -liver disease -recent or ongoing radiation therapy -tumors in the bone marrow -an unusual or allergic reaction to cyclophosphamide, other chemotherapy, other medicines, foods, dyes, or preservatives -pregnant or trying to get pregnant -breast-feeding How should I use this medicine? This drug is usually given as an injection  into a vein or muscle or by infusion into a vein. It is administered in a hospital or clinic by a specially trained health care professional. Talk to your pediatrician regarding the use of this medicine in children. Special care may be needed. Overdosage: If you think you have  taken too much of this medicine contact a poison control center or emergency room at once. NOTE: This medicine is only for you. Do not share this medicine with others. What if I miss a dose? It is important not to miss your dose. Call your doctor or health care professional if you are unable to keep an appointment. What may interact with this medicine? This medicine may interact with the following medications: -amiodarone -amphotericin B -azathioprine -certain antiviral medicines for HIV or AIDS such as protease inhibitors (e.g., indinavir, ritonavir) and zidovudine -certain blood pressure medications such as benazepril, captopril, enalapril, fosinopril, lisinopril, moexipril, monopril, perindopril, quinapril, ramipril, trandolapril -certain cancer medications such as anthracyclines (e.g., daunorubicin, doxorubicin), busulfan, cytarabine, paclitaxel, pentostatin, tamoxifen, trastuzumab -certain diuretics such as chlorothiazide, chlorthalidone, hydrochlorothiazide, indapamide, metolazone -certain medicines that treat or prevent blood clots like warfarin -certain muscle relaxants such as succinylcholine -cyclosporine -etanercept -indomethacin -medicines to increase blood counts like filgrastim, pegfilgrastim, sargramostim -medicines used as general anesthesia -metronidazole -natalizumab This list may not describe all possible interactions. Give your health care provider a list of all the medicines, herbs, non-prescription drugs, or dietary supplements you use. Also tell them if you smoke, drink alcohol, or use illegal drugs. Some items may interact with your medicine. What should I watch for while using this medicine? Visit your doctor for checks on your progress. This drug may make you feel generally unwell. This is not uncommon, as chemotherapy can affect healthy cells as well as cancer cells. Report any side effects. Continue your course of treatment even though you feel ill unless your  doctor tells you to stop. Drink water or other fluids as directed. Urinate often, even at night. In some cases, you may be given additional medicines to help with side effects. Follow all directions for their use. Call your doctor or health care professional for advice if you get a fever, chills or sore throat, or other symptoms of a cold or flu. Do not treat yourself. This drug decreases your body's ability to fight infections. Try to avoid being around people who are sick. This medicine may increase your risk to bruise or bleed. Call your doctor or health care professional if you notice any unusual bleeding. Be careful brushing and flossing your teeth or using a toothpick because you may get an infection or bleed more easily. If you have any dental work done, tell your dentist you are receiving this medicine. You may get drowsy or dizzy. Do not drive, use machinery, or do anything that needs mental alertness until you know how this medicine affects you. Do not become pregnant while taking this medicine or for 1 year after stopping it. Women should inform their doctor if they wish to become pregnant or think they might be pregnant. Men should not father a child while taking this medicine and for 4 months after stopping it. There is a potential for serious side effects to an unborn child. Talk to your health care professional or pharmacist for more information. Do not breast-feed an infant while taking this medicine. This medicine may interfere with the ability to have a child. This medicine has caused ovarian failure in some women. This medicine has caused reduced sperm counts  in some men. You should talk with your doctor or health care professional if you are concerned about your fertility. If you are going to have surgery, tell your doctor or health care professional that you have taken this medicine. What side effects may I notice from receiving this medicine? Side effects that you should report to  your doctor or health care professional as soon as possible: -allergic reactions like skin rash, itching or hives, swelling of the face, lips, or tongue -low blood counts - this medicine may decrease the number of white blood cells, red blood cells and platelets. You may be at increased risk for infections and bleeding. -signs of infection - fever or chills, cough, sore throat, pain or difficulty passing urine -signs of decreased platelets or bleeding - bruising, pinpoint red spots on the skin, black, tarry stools, blood in the urine -signs of decreased red blood cells - unusually weak or tired, fainting spells, lightheadedness -breathing problems -dark urine -dizziness -palpitations -swelling of the ankles, feet, hands -trouble passing urine or change in the amount of urine -weight gain -yellowing of the eyes or skin Side effects that usually do not require medical attention (report to your doctor or health care professional if they continue or are bothersome): -changes in nail or skin color -hair loss -missed menstrual periods -mouth sores -nausea, vomiting This list may not describe all possible side effects. Call your doctor for medical advice about side effects. You may report side effects to FDA at 1-800-FDA-1088. Where should I keep my medicine? This drug is given in a hospital or clinic and will not be stored at home. NOTE: This sheet is a summary. It may not cover all possible information. If you have questions about this medicine, talk to your doctor, pharmacist, or health care provider.  2018 Elsevier/Gold Standard (2012-02-20 16:22:58)    Vincristine injection What is this medicine? VINCRISTINE (vin KRIS teen) is a chemotherapy drug. It slows the growth of cancer cells. This medicine is used to treat many types of cancer like Hodgkin's disease, leukemia, non-Hodgkin's lymphoma, neuroblastoma (brain cancer), rhabdomyosarcoma, and Wilms' tumor. This medicine may be used for  other purposes; ask your health care provider or pharmacist if you have questions. COMMON BRAND NAME(S): Oncovin, Vincasar PFS What should I tell my health care provider before I take this medicine? They need to know if you have any of these conditions: -blood disorders -gout -infection (especially chickenpox, cold sores, or herpes) -kidney disease -liver disease -lung disease -nervous system disease like Charcot-Marie-Tooth (CMT) -recent or ongoing radiation therapy -an unusual or allergic reaction to vincristine, other chemotherapy agents, other medicines, foods, dyes, or preservatives -pregnant or trying to get pregnant -breast-feeding How should I use this medicine? This drug is given as an infusion into a vein. It is administered in a hospital or clinic by a specially trained health care professional. If you have pain, swelling, burning, or any unusual feeling around the site of your injection, tell your health care professional right away. Talk to your pediatrician regarding the use of this medicine in children. While this drug may be prescribed for selected conditions, precautions do apply. Overdosage: If you think you have taken too much of this medicine contact a poison control center or emergency room at once. NOTE: This medicine is only for you. Do not share this medicine with others. What if I miss a dose? It is important not to miss your dose. Call your doctor or health care professional if you are unable  to keep an appointment. What may interact with this medicine? Do not take this medicine with any of the following medications: -itraconazole -mibefradil -voriconazole This medicine may also interact with the following medications: -cyclosporine -erythromycin -fluconazole -ketoconazole -medicines for HIV like delavirdine, efavirenz, nevirapine -medicines for seizures like ethotoin, fosphenotoin, phenytoin -medicines to increase blood counts like filgrastim,  pegfilgrastim, sargramostim -other chemotherapy drugs like cisplatin, L-asparaginase, methotrexate, mitomycin, paclitaxel -pegaspargase -vaccines -zalcitabine, ddC Talk to your doctor or health care professional before taking any of these medicines: -acetaminophen -aspirin -ibuprofen -ketoprofen -naproxen This list may not describe all possible interactions. Give your health care provider a list of all the medicines, herbs, non-prescription drugs, or dietary supplements you use. Also tell them if you smoke, drink alcohol, or use illegal drugs. Some items may interact with your medicine. What should I watch for while using this medicine? Your condition will be monitored carefully while you are receiving this medicine. You will need important blood work done while you are taking this medicine. This drug may make you feel generally unwell. This is not uncommon, as chemotherapy can affect healthy cells as well as cancer cells. Report any side effects. Continue your course of treatment even though you feel ill unless your doctor tells you to stop. In some cases, you may be given additional medicines to help with side effects. Follow all directions for their use. Call your doctor or health care professional for advice if you get a fever, chills or sore throat, or other symptoms of a cold or flu. Do not treat yourself. Avoid taking products that contain aspirin, acetaminophen, ibuprofen, naproxen, or ketoprofen unless instructed by your doctor. These medicines may hide a fever. Do not become pregnant while taking this medicine. Women should inform their doctor if they wish to become pregnant or think they might be pregnant. There is a potential for serious side effects to an unborn child. Talk to your health care professional or pharmacist for more information. Do not breast-feed an infant while taking this medicine. Men may have a lower sperm count while taking this medicine. Talk to your doctor if you  plan to father a child. What side effects may I notice from receiving this medicine? Side effects that you should report to your doctor or health care professional as soon as possible: -allergic reactions like skin rash, itching or hives, swelling of the face, lips, or tongue -breathing problems -confusion or changes in emotions or moods -constipation -cough -mouth sores -muscle weakness -nausea and vomiting -pain, swelling, redness or irritation at the injection site -pain, tingling, numbness in the hands or feet -problems with balance, talking, walking -seizures -stomach pain -trouble passing urine or change in the amount of urine Side effects that usually do not require medical attention (report to your doctor or health care professional if they continue or are bothersome): -diarrhea -hair loss -jaw pain -loss of appetite This list may not describe all possible side effects. Call your doctor for medical advice about side effects. You may report side effects to FDA at 1-800-FDA-1088. Where should I keep my medicine? This drug is given in a hospital or clinic and will not be stored at home. NOTE: This sheet is a summary. It may not cover all possible information. If you have questions about this medicine, talk to your doctor, pharmacist, or health care provider.  2018 Elsevier/Gold Standard (2008-01-03 17:17:13)    Doxorubicin injection What is this medicine? DOXORUBICIN (dox oh ROO bi sin) is a chemotherapy drug. It is  used to treat many kinds of cancer like leukemia, lymphoma, neuroblastoma, sarcoma, and Wilms' tumor. It is also used to treat bladder cancer, breast cancer, lung cancer, ovarian cancer, stomach cancer, and thyroid cancer. This medicine may be used for other purposes; ask your health care provider or pharmacist if you have questions. COMMON BRAND NAME(S): Adriamycin, Adriamycin PFS, Adriamycin RDF, Rubex What should I tell my health care provider before I take  this medicine? They need to know if you have any of these conditions: -heart disease -history of low blood counts caused by a medicine -liver disease -recent or ongoing radiation therapy -an unusual or allergic reaction to doxorubicin, other chemotherapy agents, other medicines, foods, dyes, or preservatives -pregnant or trying to get pregnant -breast-feeding How should I use this medicine? This drug is given as an infusion into a vein. It is administered in a hospital or clinic by a specially trained health care professional. If you have pain, swelling, burning or any unusual feeling around the site of your injection, tell your health care professional right away. Talk to your pediatrician regarding the use of this medicine in children. Special care may be needed. Overdosage: If you think you have taken too much of this medicine contact a poison control center or emergency room at once. NOTE: This medicine is only for you. Do not share this medicine with others. What if I miss a dose? It is important not to miss your dose. Call your doctor or health care professional if you are unable to keep an appointment. What may interact with this medicine? This medicine may interact with the following medications: -6-mercaptopurine -paclitaxel -phenytoin -St. John's Wort -trastuzumab -verapamil This list may not describe all possible interactions. Give your health care provider a list of all the medicines, herbs, non-prescription drugs, or dietary supplements you use. Also tell them if you smoke, drink alcohol, or use illegal drugs. Some items may interact with your medicine. What should I watch for while using this medicine? This drug may make you feel generally unwell. This is not uncommon, as chemotherapy can affect healthy cells as well as cancer cells. Report any side effects. Continue your course of treatment even though you feel ill unless your doctor tells you to stop. There is a maximum  amount of this medicine you should receive throughout your life. The amount depends on the medical condition being treated and your overall health. Your doctor will watch how much of this medicine you receive in your lifetime. Tell your doctor if you have taken this medicine before. You may need blood work done while you are taking this medicine. Your urine may turn red for a few days after your dose. This is not blood. If your urine is dark or brown, call your doctor. In some cases, you may be given additional medicines to help with side effects. Follow all directions for their use. Call your doctor or health care professional for advice if you get a fever, chills or sore throat, or other symptoms of a cold or flu. Do not treat yourself. This drug decreases your body's ability to fight infections. Try to avoid being around people who are sick. This medicine may increase your risk to bruise or bleed. Call your doctor or health care professional if you notice any unusual bleeding. Talk to your doctor about your risk of cancer. You may be more at risk for certain types of cancers if you take this medicine. Do not become pregnant while taking this medicine or  for 6 months after stopping it. Women should inform their doctor if they wish to become pregnant or think they might be pregnant. Men should not father a child while taking this medicine and for 6 months after stopping it. There is a potential for serious side effects to an unborn child. Talk to your health care professional or pharmacist for more information. Do not breast-feed an infant while taking this medicine. This medicine has caused ovarian failure in some women and reduced sperm counts in some men This medicine may interfere with the ability to have a child. Talk with your doctor or health care professional if you are concerned about your fertility. What side effects may I notice from receiving this medicine? Side effects that you should report  to your doctor or health care professional as soon as possible: -allergic reactions like skin rash, itching or hives, swelling of the face, lips, or tongue -breathing problems -chest pain -fast or irregular heartbeat -low blood counts - this medicine may decrease the number of white blood cells, red blood cells and platelets. You may be at increased risk for infections and bleeding. -pain, redness, or irritation at site where injected -signs of infection - fever or chills, cough, sore throat, pain or difficulty passing urine -signs of decreased platelets or bleeding - bruising, pinpoint red spots on the skin, black, tarry stools, blood in the urine -swelling of the ankles, feet, hands -tiredness -weakness Side effects that usually do not require medical attention (report to your doctor or health care professional if they continue or are bothersome): -diarrhea -hair loss -mouth sores -nail discoloration or damage -nausea -red colored urine -vomiting This list may not describe all possible side effects. Call your doctor for medical advice about side effects. You may report side effects to FDA at 1-800-FDA-1088. Where should I keep my medicine? This drug is given in a hospital or clinic and will not be stored at home. NOTE: This sheet is a summary. It may not cover all possible information. If you have questions about this medicine, talk to your doctor, pharmacist, or health care provider.  2018 Elsevier/Gold Standard (2015-06-04 11:28:51)

## 2017-01-16 NOTE — Progress Notes (Signed)
Patient Care Team: Marletta Lor, MD as PCP - General Marcy Panning, MD as Consulting Physician (Internal Medicine) Thea Silversmith, MD (Inactive) as Consulting Physician (Radiation Oncology)  DIAGNOSIS:  Encounter Diagnosis  Name Primary?  . 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 Radiation recall related to tamoxifen      11/25/2010 -  Anti-estrogen oral therapy    Aromasin 25 mg daily.myalgias and arthralgias stop November 2012 went back to tamoxifen 20 mg daily      01/06/2017 Pathology Results    Bone marrow biopsy: Slightly hypercellular bone marrow for age with granulocytic hyperplasia, numerous small lymphoid aggregates present. 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 positive for LCA, CD20, CD79a PAX-5, BCL-6 and weak cytoplasmic kappa associated with patchy weak positivity for BCL-2 and CD10, Ki-67 40%       CHIEF COMPLIANT: Cycle 1 day 1R CHOP  INTERVAL HISTORY: Kristen Escobar is a 71 year old lady with above-mentioned history of severe lymphadenopathy in the neck with invasion to the esophagus who was diagnosed with diffuse large B-cell lymphoma. She underwent a bone marrow  biopsy which seemed to suggest oddly to have an indolent lymphoma most likely SLL. She is here today to start her first cycle of chemotherapy with R CHOP.  REVIEW OF SYSTEMS:   Constitutional: Denies fevers, chills or abnormal weight loss Eyes: Denies blurriness of vision Ears, nose, mouth, throat, and face: Neck mass with invasion of the esophagus Respiratory: Denies cough, dyspnea or wheezes Cardiovascular: Denies palpitation, chest discomfort Gastrointestinal:  Denies nausea, heartburn or change in bowel habits Skin: Denies abnormal skin rashes Lymphatics: Denies new lymphadenopathy or easy bruising Neurological:Denies numbness, tingling or new weaknesses Behavioral/Psych: Mood is stable, no new changes  Extremities: No lower extremity edema  All other systems were reviewed with the patient and are negative.  I have reviewed the past medical history, past surgical history, social history and family history with the patient and they are unchanged from previous note.  ALLERGIES:  has No Known Allergies.  MEDICATIONS:  Current Outpatient Prescriptions  Medication Sig Dispense Refill  . allopurinol (ZYLOPRIM) 300 MG tablet Take 0.5 tablets (150 mg total) by mouth daily. 30 tablet 3  . allopurinol (ZYLOPRIM) 300 MG tablet Take 1 tablet (300 mg total) by mouth daily. 30 tablet 3  . atorvastatin (LIPITOR) 10 MG tablet TAKE 1 TABLET EVERY DAY 90 tablet 3  . cephALEXin (KEFLEX) 500 MG capsule Take 1 capsule (500 mg total) by mouth 3 (three) times daily. 15 capsule 0  . HYDROcodone-acetaminophen (HYCET) 7.5-325 mg/15 ml solution Take 15 mLs by mouth 4 (four) times daily as needed for moderate pain. 480 mL 0  . HYDROcodone-acetaminophen (  NORCO) 7.5-325 MG tablet Take 1 tablet by mouth every 6 (six) hours as needed for moderate pain. 20 tablet 0  . ibuprofen (ADVIL,MOTRIN) 200 MG tablet Take 400 mg by mouth every 8 (eight) hours as needed for mild pain or moderate pain (as needed).     Marland Kitchen  levothyroxine (SYNTHROID, LEVOTHROID) 50 MCG tablet TAKE 1 TABLET BY MOUTH DAILY 90 tablet 1  . lidocaine (XYLOCAINE) 2 % solution Use as directed 20 mLs in the mouth or throat every 3 (three) hours as needed for mouth pain. 100 mL 3  . lidocaine-prilocaine (EMLA) cream Apply to affected area once 30 g 3  . LORazepam (ATIVAN) 0.5 MG tablet Take 1 tablet (0.5 mg total) by mouth every 6 (six) hours as needed (Nausea or vomiting). 30 tablet 0  . losartan-hydrochlorothiazide (HYZAAR) 100-25 MG tablet TAKE 1 TABLET BY MOUTH EVERY DAY 90 tablet 1  . magic mouthwash w/lidocaine SOLN Take 5 mLs by mouth 3 (three) times daily as needed for mouth pain. 100 mL 1  . omeprazole (PRILOSEC) 20 MG capsule TAKE 1 CAPSULE BY MOUTH EVERY DAY 30 capsule 3  . ondansetron (ZOFRAN) 8 MG tablet Take 1 tablet (8 mg total) by mouth 2 (two) times daily as needed for refractory nausea / vomiting. Start on day 3 after cyclophosphamide chemotherapy. 30 tablet 1  . predniSONE (DELTASONE) 20 MG tablet Take 3 tablets (60 mg total) by mouth daily. Take on days 1-5 of chemotherapy. 90 tablet 0  . prochlorperazine (COMPAZINE) 10 MG tablet Take 1 tablet (10 mg total) by mouth every 6 (six) hours as needed (Nausea or vomiting). 30 tablet 6  . promethazine (PHENERGAN) 25 MG suppository Place 1 suppository (25 mg total) rectally every 6 (six) hours as needed for nausea or vomiting. 12 suppository 1  . sucralfate (CARAFATE) 1 GM/10ML suspension Take 10 mLs (1 g total) by mouth 4 (four) times daily -  with meals and at bedtime. 420 mL 3   No current facility-administered medications for this visit.    Facility-Administered Medications Ordered in Other Visits  Medication Dose Route Frequency Provider Last Rate Last Dose  . cyclophosphamide (CYTOXAN) 1,620 mg in sodium chloride 0.9 % 250 mL chemo infusion  750 mg/m2 (Treatment Plan Recorded) Intravenous Once Nicholas Lose, MD 662 mL/hr at 01/16/17 1224 1,620 mg at 01/16/17 1224  . heparin  lock flush 100 unit/mL  500 Units Intracatheter Once PRN Nicholas Lose, MD      . pegfilgrastim (NEULASTA ONPRO KIT) injection 6 mg  6 mg Subcutaneous Once Nicholas Lose, MD      . riTUXimab (RITUXAN) 800 mg in sodium chloride 0.9 % 250 mL (2.4242 mg/mL) chemo infusion  375 mg/m2 (Treatment Plan Recorded) Intravenous Once Nicholas Lose, MD      . sodium chloride flush (NS) 0.9 % injection 10 mL  10 mL Intracatheter PRN Nicholas Lose, MD        PHYSICAL EXAMINATION: ECOG PERFORMANCE STATUS: 1 - Symptomatic but completely ambulatory  Vitals:   01/16/17 0924  BP: (!) 160/48  Pulse: 77  Resp: 18  Temp: 98.2 F (36.8 C)  SpO2: 98%   Filed Weights   01/16/17 0924  Weight: 221 lb 9.6 oz (100.5 kg)    GENERAL:alert, no distress and comfortable SKIN: skin color, texture, turgor are normal, no rashes or significant lesions EYES: normal, Conjunctiva are pink and non-injected, sclera clear OROPHARYNX:no exudate, no erythema and lips, buccal mucosa, and tongue normal  NECK: supple, thyroid normal size,  non-tender, without nodularity LYMPH:  no palpable lymphadenopathy in the cervical, axillary or inguinal LUNGS: clear to auscultation and percussion with normal breathing effort HEART: regular rate & rhythm and no murmurs and no lower extremity edema ABDOMEN:abdomen soft, non-tender and normal bowel sounds MUSCULOSKELETAL:no cyanosis of digits and no clubbing  NEURO: alert & oriented x 3 with fluent speech, no focal motor/sensory deficits EXTREMITIES: No lower extremity edema   LABORATORY DATA:  I have reviewed the data as listed   Chemistry      Component Value Date/Time   NA 138 01/16/2017 0851   K 3.1 (L) 01/16/2017 0851   CL 101 12/24/2016 1133   CL 105 03/10/2012 0859   CO2 24 01/16/2017 0851   BUN 32.7 (H) 01/16/2017 0851   CREATININE 1.4 (H) 01/16/2017 0851      Component Value Date/Time   CALCIUM 9.4 01/16/2017 0851   ALKPHOS 59 01/16/2017 0851   AST 41 (H) 01/16/2017  0851   ALT 60 (H) 01/16/2017 0851   BILITOT 0.64 01/16/2017 0851       Lab Results  Component Value Date   WBC 11.9 (H) 01/16/2017   HGB 12.3 01/16/2017   HCT 36.4 01/16/2017   MCV 85.1 01/16/2017   PLT 284 01/16/2017   NEUTROABS 9.1 (H) 01/16/2017    ASSESSMENT & PLAN:  Breast cancer of upper-outer quadrant of right female breast 12/26/2016 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 positive for LCA, CD20, CD79a PAX-5, BCL-6 and weak cytoplasmic kappa associated with patchy weak positivity for BCL-2 and CD10, Ki-67 40%  Bone marrow biopsy negative for diffuse large B cell lymphoma but suggestive of small low-grade B cell population possibly SLL/CLL  Revised IPI score: 1, good prognosis, redictated for years PFS 80%, predicted overall survival 79% ------------------------------------------------------------------------ Current treatment: R CHOP 6 cycles today is cycle 1 day 1 Antiemetics were reviewed Chemotherapy education completed Consent obtained Labs were reviewed Hepatitis B and C negative  Patient understands the risks involved with chemotherapy. I am also concerned about tumor in the esophagus whether it could bleed as a result of response to chemotherapy. We will have to monitor her very closely Return to clinic in one week for toxicity check   I spent 25 minutes talking to the patient of which more than half was spent in counseling and coordination of care.  No orders of the defined types were placed in this encounter.  The patient has a good understanding of the overall plan. she agrees with it. she will call with any problems that may develop before the next visit here.   Rulon Eisenmenger, MD 01/16/17

## 2017-01-19 ENCOUNTER — Telehealth: Payer: Self-pay

## 2017-01-19 DIAGNOSIS — I2699 Other pulmonary embolism without acute cor pulmonale: Secondary | ICD-10-CM

## 2017-01-19 DIAGNOSIS — C50411 Malignant neoplasm of upper-outer quadrant of right female breast: Secondary | ICD-10-CM

## 2017-01-19 DIAGNOSIS — C8330 Diffuse large B-cell lymphoma, unspecified site: Secondary | ICD-10-CM

## 2017-01-19 HISTORY — DX: Other pulmonary embolism without acute cor pulmonale: I26.99

## 2017-01-19 NOTE — Telephone Encounter (Signed)
Husband called that since RCHOP on 9/28 pt has been choking or gagging on liquids when they touch area of tumor in back of throat. She has not been able to eat solids at all this weekend. Before was eating mashed potato or stewed apples and drinking high protein drinks. Now is unable to drink the high protein supplements. They have been trying different things and she can get a little warm chicken broth down. They did not get MMW until today and will try that this evening. They are worried about dehydration.   Pt has been having loose congestion cough bringing up clear to pale yellow sputum. Prior to [facility] she was having a deep wheezing but not bringing up any sputum.  Dr Lindi Adie not available this evening, will s/w Dr Lindi Adie in AM and call pt back then. Husband is OK with this plan.

## 2017-01-20 ENCOUNTER — Ambulatory Visit (HOSPITAL_BASED_OUTPATIENT_CLINIC_OR_DEPARTMENT_OTHER): Payer: Medicare Other | Admitting: Medical

## 2017-01-20 ENCOUNTER — Ambulatory Visit: Payer: Medicare Other

## 2017-01-20 ENCOUNTER — Other Ambulatory Visit (HOSPITAL_BASED_OUTPATIENT_CLINIC_OR_DEPARTMENT_OTHER): Payer: Medicare Other

## 2017-01-20 ENCOUNTER — Telehealth: Payer: Self-pay | Admitting: Hematology and Oncology

## 2017-01-20 VITALS — BP 173/55 | HR 68 | Temp 98.8°F | Resp 20 | Ht 63.0 in | Wt 220.5 lb

## 2017-01-20 DIAGNOSIS — C8331 Diffuse large B-cell lymphoma, lymph nodes of head, face, and neck: Secondary | ICD-10-CM

## 2017-01-20 DIAGNOSIS — C8330 Diffuse large B-cell lymphoma, unspecified site: Secondary | ICD-10-CM

## 2017-01-20 DIAGNOSIS — C50411 Malignant neoplasm of upper-outer quadrant of right female breast: Secondary | ICD-10-CM

## 2017-01-20 DIAGNOSIS — R1314 Dysphagia, pharyngoesophageal phase: Secondary | ICD-10-CM

## 2017-01-20 DIAGNOSIS — E876 Hypokalemia: Secondary | ICD-10-CM | POA: Diagnosis not present

## 2017-01-20 DIAGNOSIS — Z170 Estrogen receptor positive status [ER+]: Secondary | ICD-10-CM

## 2017-01-20 DIAGNOSIS — T80212S Local infection due to central venous catheter, sequela: Secondary | ICD-10-CM

## 2017-01-20 DIAGNOSIS — T80212A Local infection due to central venous catheter, initial encounter: Secondary | ICD-10-CM | POA: Insufficient documentation

## 2017-01-20 DIAGNOSIS — Z95828 Presence of other vascular implants and grafts: Secondary | ICD-10-CM

## 2017-01-20 LAB — CBC WITH DIFFERENTIAL/PLATELET
BASO%: 0.2 % (ref 0.0–2.0)
BASOS ABS: 0 10*3/uL (ref 0.0–0.1)
EOS ABS: 0 10*3/uL (ref 0.0–0.5)
EOS%: 0 % (ref 0.0–7.0)
HEMATOCRIT: 35.3 % (ref 34.8–46.6)
HEMOGLOBIN: 11.7 g/dL (ref 11.6–15.9)
LYMPH#: 0.6 10*3/uL — AB (ref 0.9–3.3)
LYMPH%: 3 % — ABNORMAL LOW (ref 14.0–49.7)
MCH: 28 pg (ref 25.1–34.0)
MCHC: 33.2 g/dL (ref 31.5–36.0)
MCV: 84.4 fL (ref 79.5–101.0)
MONO#: 0.2 10*3/uL (ref 0.1–0.9)
MONO%: 0.8 % (ref 0.0–14.0)
NEUT#: 18 10*3/uL — ABNORMAL HIGH (ref 1.5–6.5)
NEUT%: 96 % — ABNORMAL HIGH (ref 38.4–76.8)
Platelets: 148 10*3/uL (ref 145–400)
RBC: 4.18 10*6/uL (ref 3.70–5.45)
RDW: 14.6 % — AB (ref 11.2–14.5)
WBC: 18.7 10*3/uL — ABNORMAL HIGH (ref 3.9–10.3)

## 2017-01-20 LAB — COMPREHENSIVE METABOLIC PANEL
ALBUMIN: 3.2 g/dL — AB (ref 3.5–5.0)
ALK PHOS: 60 U/L (ref 40–150)
ALT: 243 U/L — ABNORMAL HIGH (ref 0–55)
AST: 99 U/L — AB (ref 5–34)
Anion Gap: 10 mEq/L (ref 3–11)
BUN: 35.7 mg/dL — AB (ref 7.0–26.0)
CALCIUM: 9 mg/dL (ref 8.4–10.4)
CO2: 24 mEq/L (ref 22–29)
CREATININE: 1.2 mg/dL — AB (ref 0.6–1.1)
Chloride: 109 mEq/L (ref 98–109)
EGFR: 46 mL/min/{1.73_m2} — ABNORMAL LOW (ref >90)
Glucose: 129 mg/dl (ref 70–140)
POTASSIUM: 3.2 meq/L — AB (ref 3.5–5.1)
Sodium: 143 mEq/L (ref 136–145)
Total Bilirubin: 0.62 mg/dL (ref 0.20–1.20)
Total Protein: 7 g/dL (ref 6.4–8.3)

## 2017-01-20 LAB — MAGNESIUM: MAGNESIUM: 2.3 mg/dL (ref 1.5–2.5)

## 2017-01-20 LAB — URIC ACID: Uric Acid, Serum: 6.6 mg/dl (ref 2.6–7.4)

## 2017-01-20 MED ORDER — HEPARIN SOD (PORK) LOCK FLUSH 100 UNIT/ML IV SOLN
500.0000 [IU] | Freq: Once | INTRAVENOUS | Status: AC
  Administered 2017-01-20: 500 [IU] via INTRAVENOUS
  Filled 2017-01-20: qty 5

## 2017-01-20 MED ORDER — SODIUM CHLORIDE 0.9 % IV SOLN
Freq: Once | INTRAVENOUS | Status: AC
  Administered 2017-01-20: 12:00:00 via INTRAVENOUS
  Filled 2017-01-20: qty 100

## 2017-01-20 MED ORDER — HEPARIN SOD (PORK) LOCK FLUSH 100 UNIT/ML IV SOLN
500.0000 [IU] | Freq: Once | INTRAVENOUS | Status: DC
  Filled 2017-01-20: qty 5

## 2017-01-20 MED ORDER — SODIUM CHLORIDE 0.9 % IV SOLN
Freq: Once | INTRAVENOUS | Status: AC
  Administered 2017-01-20: 10:00:00 via INTRAVENOUS

## 2017-01-20 MED ORDER — POTASSIUM CHLORIDE 10 MEQ/100ML IV SOLN: 10.0000 meq | Freq: Once | INTRAVENOUS | Status: DC

## 2017-01-20 MED ORDER — SODIUM CHLORIDE 0.9% FLUSH
10.0000 mL | Freq: Once | INTRAVENOUS | Status: AC
  Administered 2017-01-20: 10 mL
  Filled 2017-01-20: qty 10

## 2017-01-20 MED ORDER — SODIUM CHLORIDE 0.9% FLUSH
10.0000 mL | Freq: Once | INTRAVENOUS | Status: AC
  Administered 2017-01-20: 10 mL via INTRAVENOUS
  Filled 2017-01-20: qty 10

## 2017-01-20 NOTE — Telephone Encounter (Signed)
Left message for patient re fluids for 10/3.

## 2017-01-20 NOTE — Telephone Encounter (Signed)
S/w Dr Lindi Adie and having pt come in for labs and [facility] with possible fluids.  Called husband and they will be at [facility] approx 0845.  inbasket sent for lab/ flush/ [facility],  Cbc, cmet, uric acid, and magnesium per Dr Lindi Adie

## 2017-01-20 NOTE — Progress Notes (Signed)
[facility] Progress Note   Kristen Escobar [id number] 20-Jan-1946 71 y.o.  Kristen Escobar is managed by Dr. Nicholas Lose  Actively treated with chemotherapy: yes  Current Therapy: R-CHOP  Last Treated: 09 / 28 / 2018  Assessment: Plan:    Diffuse large B-cell lymphoma, unspecified body region ([place]) - Plan: 0.9 %  sodium chloride infusion  Pharyngoesophageal dysphagia  Hypokalemia   Diffuse large B-cell lymphoma: Kristen Escobar  is status post cycle #1 of R-CHOP. She will return for follow-up with Dr. Nicholas Lose on 01/23/2017.  Pharyngoesophageal dysphasia: The patient is been encouraged to continue drinking fluids to maintain good hydration. She will return to the office tomorrow for additional IV fluids. The patient is been encouraged to advance her diet as tolerated.  Hypokalemia: The patient was given potassium chloride 10 mEq IV today. Labs will be repeated at her appointment with Dr. Lindi Escobar on Friday .  Please see After Visit Summary for patient specific instructions.  Future Appointments Date Time Provider Ke[place]  01/23/2017 9:45 AM [facility] LAB 2 [facility] None  01/23/2017 10:15 AM Nicholas Lose, MD [facility] None  02/06/2017 7:45 AM [facility] LAB 6 [facility] None  02/06/2017 8:00 AM [facility] FLUSH NURSE 2 [facility] None  02/06/2017 8:30 AM Nicholas Lose, MD [facility] None  02/06/2017 9:30 AM [facility] E15 [facility] None  02/27/2017 7:45 AM [facility] LAB 6 [facility] None  02/27/2017 8:00 AM [facility] INJ NURSE [facility] None  02/27/2017 8:30 AM Causey, Charlestine Massed, NP [facility] None  02/27/2017 9:30 AM [facility] C8 [facility] None  03/20/2017 8:30 AM [facility] LAB 5 [facility] None  03/20/2017 8:45 AM [facility] J32 DNS [facility] None  03/20/2017 9:15 AM Nicholas Lose, MD [facility] None  03/20/2017 10:15 AM [facility] C10 [facility] None  03/24/2017 8:45 AM Marletta Lor, MD [facility]  08/04/2017 8:30 AM Nicholas Lose, MD [facility] None    No orders of the defined types were placed in this encounter.     Subjective:   Patient ID:  Kristen Escobar is a 70 y.o. (DOB 1945-05-21) female.  Chief Complaint:  Chief Complaint  Patient presents with  . 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 positive for LCA, CD20, CD79a PAX-5, BCL-6 and weak cytoplasmic kappa associated with patchy weak positivity for BCL-2 and CD10, Ki-67 40% with severe lymphadenopathy in the neck and invasion to the esophagus. She is status post cycle #1 of R-CHOP dosed on 01/16/2017. The patient has been choking and gagging on liquids when they touch the area of tumor in the back of her throat. She was not been able to eat solids at all this past weekend. Prior to that, she had been eating mashed potato, stewed apples and drinking high protein drinks. She is currently unable to drink the high protein supplements but can get a little warm chicken broth down. She began using MMW last evening, but did not get the prescription filled until yesterday. She also acknowledges loose congestion and a cough. She has been producing clear to pale yellow sputum. Prior to R-CHOP she was having deep wheezing but was not bringing up any sputum. She is status post multiple needle biopsies and surgical biopsies of the neck. She had been having wheezing prior to her chemotherapy. Her wheezing resolved early in the morning of 01/17/2017 after having her first cycle of chemotherapy the day before. Since that time she has been  having increased cough with clear to yellowish sputum. She reports having difficulty with swallowing as she feels as though she is getting choked and coughing is persistent attenuated with eating and with intake of fluids. She had been eating soft foods but was unable to eat yesterday. She is  drinking small sips of liquids. She was able to keep down chicken broth this morning and today. She reports that her cough is worsened with cold liquids.   Medications: I have reviewed the patient's current medications.  Allergies: No Known Allergies  Past Medical History:  Diagnosis Date  . Arthritis   . Cancer ([place]) 03/2009   breast- rt  . GERD (gastroesophageal reflux disease)   . History of radiation therapy 07/12/10,completed   right breast 60 Gy x30 fx  . Hypertension   . Hypothyroidism   . Obesity   . Peripheral vascular disease ([place]) 1995   PT DEVELOPED CIRCULATION PROBLEMS IN BOTH HANDS AND GANGRENE OF BOTH INDEX FINGERS--REQUIRING AMPUTATION OF THE INDEX FINGERS AND VASCULAR SURGERY.  PT TOLD HER PROBLEMS RELATED TO SMOKING.   NO OTHER PROBLEMS SINCE.  Marland Kitchen Pneumonia   . Thyroid mass     Past Surgical History:  Procedure Laterality Date  . AMPUTATION     partial amputation of both index fingers  . BREAST SURGERY  2011   lumpectomy with node sampling- RIGHT  . COLONOSCOPY    . ESOPHAGOGASTRODUODENOSCOPY    . EXCISION MASS NECK Left 12/26/2016   Procedure: EXCISION MASS NECK;  Surgeon: Izora Gala, MD;  Location: [place];  Service: ENT;  Laterality: Left;  open excision of thyroid mass left side with frozen section  . HAND SURGERY Bilateral 1995   Amputaed pointer fingers bilaterally  . IR FLUORO GUIDE PORT INSERTION RIGHT  01/14/2017  . IR US GUIDE VASC ACCESS RIGHT  01/14/2017  . TOTAL HIP ARTHROPLASTY  12/16/2011   right hip  . TOTAL HIP ARTHROPLASTY  01/20/2012   Procedure: TOTAL HIP ARTHROPLASTY ANTERIOR APPROACH;  Surgeon: Mauri Pole, MD;  Location: [facility];  Service: Orthopedics;  Laterality: Left;  . TUBAL LIGATION    . VASCULAR SURGERY     both hands    Family History  Problem Relation Age of Onset  . Cancer Mother        pt unaware of what kind  . Hearing loss Mother   . Coronary artery disease Father   . Diabetes Father   . Coronary artery disease  Brother   . Coronary artery disease Brother   . Diabetes Sister   . Cancer Sister        breast  . Colon cancer Neg Hx   . Stomach cancer Neg Hx   . Rectal cancer Neg Hx   . Esophageal cancer Neg Hx     Social History   Social History  . Marital status: Married    Spouse name: N/A  . Number of children: N/A  . Years of education: N/A   Occupational History  . Not on file.   Social History Main Topics  . Smoking status: Former Smoker    Packs/day: 1.00    Years: 20.00    Quit date: 04/21/1993  . Smokeless tobacco: Never Used  . Alcohol use Yes     Comment: OCCAS - MAYBE ONCE A MONTH  . Drug use: No  . Sexual activity: Yes   Other Topics Concern  . Not on file   Social History Narrative  . No narrative on  file    Past Medical History, Surgical history, Social history, and Family history were reviewed and updated as appropriate.   Please see review of systems for further details on the patient's review from today.   Review of Systems:  Review of Systems  Constitutional: Negative for appetite change, chills, diaphoresis and fever.  HENT: Positive for trouble swallowing.   Respiratory: Positive for cough and choking. Negative for chest tightness, shortness of breath and wheezing.   Cardiovascular: Negative for chest pain, palpitations and leg swelling.  Gastrointestinal: Negative for constipation, diarrhea, nausea and vomiting.    Objective:   Physical Exam:  BP (!) 162/57 (BP Location: Right Arm, Patient Position: Sitting)   Pulse 73   Temp 98.8 F (37.1 C) (Oral)   Resp 20   Ht '5\' 3"'$  (1.6 m)   Wt 220 lb 8 oz (100 kg)   SpO2 97%   BMI 39.06 kg/m  ECOG: 1  Physical Exam  Constitutional: No distress.  HENT:  Head: Normocephalic and atraumatic.  Mouth/Throat: Oropharynx is clear and moist. No oropharyngeal exudate.  Eyes: Right eye exhibits no discharge. Left eye exhibits no discharge. No scleral icterus.  Neck: Normal range of motion.    Cardiovascular: Normal rate, regular rhythm and normal heart sounds.  Exam reveals no gallop and no friction rub.   No murmur heard. Pulmonary/Chest: Effort normal and breath sounds normal. No respiratory distress. She has no wheezes. She has no rales.  An accessed port was noted in the right chest wall. No erythema, edema, increased warmth, or tenderness noted.   Abdominal: Soft. Bowel sounds are normal. She exhibits no distension. There is no tenderness. There is no rebound and no guarding.  Lymphadenopathy:    She has no cervical adenopathy.  Neurological: She is alert. Coordination normal.  Skin: Skin is warm and dry. No rash noted. She is not diaphoretic. No erythema.    Lab Review:     Component Value Date/Time   NA 143 01/20/2017 0907   K 3.2 (L) 01/20/2017 0907   CL 101 12/24/2016 1133   CL 105 03/10/2012 0859   CO2 24 01/20/2017 0907   GLUCOSE 129 01/20/2017 0907   GLUCOSE 149 (H) 03/10/2012 0859   BUN 35.7 (H) 01/20/2017 0907   CREATININE 1.2 (H) 01/20/2017 0907   CALCIUM 9.0 01/20/2017 0907   PROT 7.0 01/20/2017 0907   ALBUMIN 3.2 (L) 01/20/2017 0907   AST 99 (H) 01/20/2017 0907   ALT 243 (H) 01/20/2017 0907   ALKPHOS 60 01/20/2017 0907   BILITOT 0.62 01/20/2017 0907   GFRNONAA 33 (L) 12/24/2016 1133   GFRAA 38 (L) 12/24/2016 1133       Component Value Date/Time   WBC 18.7 (H) 01/20/2017 0907   WBC 11.9 (H) 01/14/2017 0745   RBC 4.18 01/20/2017 0907   RBC 4.50 01/14/2017 0745   HGB 11.7 01/20/2017 0907   HCT 35.3 01/20/2017 0907   PLT 148 01/20/2017 0907   MCV 84.4 01/20/2017 0907   MCH 28.0 01/20/2017 0907   MCH 28.4 01/14/2017 0745   MCHC 33.2 01/20/2017 0907   MCHC 34.1 01/14/2017 0745   RDW 14.6 (H) 01/20/2017 0907   LYMPHSABS 0.6 (L) 01/20/2017 0907   MONOABS 0.2 01/20/2017 0907   EOSABS 0.0 01/20/2017 0907   EOSABS 0.4 05/02/2009 1517   BASOSABS 0.0 01/20/2017 0907   -------------------------------  Imaging from last 24 hours (if  applicable):  Radiology interpretation: Ir US Guide Vasc Access Right  Result Date: 01/14/2017  INDICATION: History of lymphoma. In need of durable intravenous access for chemotherapy administration. EXAM: IMPLANTED PORT A CATH PLACEMENT WITH ULTRASOUND AND FLUOROSCOPIC GUIDANCE COMPARISON:  PET-CT - 12/08/2016 MEDICATIONS: Ancef 2 gm IV; The antibiotic was administered within an appropriate time interval prior to skin puncture. ANESTHESIA/SEDATION: Moderate (conscious) sedation was employed during this procedure. A total of Versed 2 mg and Fentanyl 100 mcg was administered intravenously. Moderate Sedation Time: 23 minutes. [name] level of consciousness and vital signs were monitored continuously by radiology nursing throughout the procedure under my direct supervision. CONTRAST:  None FLUOROSCOPY TIME:  18 seconds (4.8 mGy) COMPLICATIONS: None immediate. PROCEDURE: The procedure, risks, benefits, and alternatives were explained to the patient. Questions regarding the procedure were encouraged and answered. The patient understands and consents to the procedure. The right neck and chest were prepped with chlorhexidine in a sterile fashion, and a sterile drape was applied covering the operative field. Maximum barrier sterile technique with sterile gowns and gloves were used for the procedure. A timeout was performed prior to the initiation of the procedure. Local anesthesia was provided with 1% lidocaine with epinephrine. After creating a small venotomy incision, a micropuncture kit was utilized to access the internal jugular vein. Real-time ultrasound guidance was utilized for vascular access including the acquisition of a permanent ultrasound image documenting patency of the accessed vessel. The microwire was utilized to measure appropriate catheter length. A subcutaneous port pocket was then created along the upper chest wall utilizing a combination of sharp and blunt dissection. The pocket was irrigated  with sterile saline. A single lumen ISP power injectable port was chosen for placement. The 8 Fr catheter was tunneled from the port pocket site to the venotomy incision. The port was placed in the pocket. The external catheter was trimmed to appropriate length. At the venotomy, an 8 Fr peel-away sheath was placed over a guidewire under fluoroscopic guidance. The catheter was then placed through the sheath and the sheath was removed. Final catheter positioning was confirmed and documented with a fluoroscopic spot radiograph. The port was accessed with a Huber needle, aspirated and flushed with heparinized saline. The venotomy site was closed with an interrupted 4-0 Vicryl suture. The port pocket incision was closed with interrupted 2-0 Vicryl suture and the skin was opposed with a running subcuticular 4-0 Vicryl suture. Dermabond and Steri-strips were applied to both incisions. Dressings were placed. The patient tolerated the procedure well without immediate post procedural complication. FINDINGS: After catheter placement, the tip lies within the superior cavoatrial junction. The catheter aspirates and flushes normally and is ready for immediate use. IMPRESSION: Successful placement of a right internal jugular approach power injectable Port-A-Cath. The catheter is ready for immediate use. Electronically Signed   By: Sandi Mariscal M.D.   On: 01/14/2017 10:55   Ir Fluoro Guide Port Insertion Right  Result Date: 01/14/2017 INDICATION: History of lymphoma. In need of durable intravenous access for chemotherapy administration. EXAM: IMPLANTED PORT A CATH PLACEMENT WITH ULTRASOUND AND FLUOROSCOPIC GUIDANCE COMPARISON:  PET-CT - 12/08/2016 MEDICATIONS: Ancef 2 gm IV; The antibiotic was administered within an appropriate time interval prior to skin puncture. ANESTHESIA/SEDATION: Moderate (conscious) sedation was employed during this procedure. A total of Versed 2 mg and Fentanyl 100 mcg was administered intravenously.  Moderate Sedation Time: 23 minutes. [name] level of consciousness and vital signs were monitored continuously by radiology nursing throughout the procedure under my direct supervision. CONTRAST:  None FLUOROSCOPY TIME:  18 seconds (4.8 mGy) COMPLICATIONS: None immediate. PROCEDURE:  The procedure, risks, benefits, and alternatives were explained to the patient. Questions regarding the procedure were encouraged and answered. The patient understands and consents to the procedure. The right neck and chest were prepped with chlorhexidine in a sterile fashion, and a sterile drape was applied covering the operative field. Maximum barrier sterile technique with sterile gowns and gloves were used for the procedure. A timeout was performed prior to the initiation of the procedure. Local anesthesia was provided with 1% lidocaine with epinephrine. After creating a small venotomy incision, a micropuncture kit was utilized to access the internal jugular vein. Real-time ultrasound guidance was utilized for vascular access including the acquisition of a permanent ultrasound image documenting patency of the accessed vessel. The microwire was utilized to measure appropriate catheter length. A subcutaneous port pocket was then created along the upper chest wall utilizing a combination of sharp and blunt dissection. The pocket was irrigated with sterile saline. A single lumen ISP power injectable port was chosen for placement. The 8 Fr catheter was tunneled from the port pocket site to the venotomy incision. The port was placed in the pocket. The external catheter was trimmed to appropriate length. At the venotomy, an 8 Fr peel-away sheath was placed over a guidewire under fluoroscopic guidance. The catheter was then placed through the sheath and the sheath was removed. Final catheter positioning was confirmed and documented with a fluoroscopic spot radiograph. The port was accessed with a Huber needle, aspirated and flushed  with heparinized saline. The venotomy site was closed with an interrupted 4-0 Vicryl suture. The port pocket incision was closed with interrupted 2-0 Vicryl suture and the skin was opposed with a running subcuticular 4-0 Vicryl suture. Dermabond and Steri-strips were applied to both incisions. Dressings were placed. The patient tolerated the procedure well without immediate post procedural complication. FINDINGS: After catheter placement, the tip lies within the superior cavoatrial junction. The catheter aspirates and flushes normally and is ready for immediate use. IMPRESSION: Successful placement of a right internal jugular approach power injectable Port-A-Cath. The catheter is ready for immediate use. Electronically Signed   By: Sandi Mariscal M.D.   On: 01/14/2017 10:55       The patient was discussed with Dr. Nicholas Lose.

## 2017-01-21 ENCOUNTER — Ambulatory Visit: Payer: Medicare Other

## 2017-01-21 ENCOUNTER — Other Ambulatory Visit: Payer: Self-pay | Admitting: Medical

## 2017-01-21 ENCOUNTER — Ambulatory Visit (HOSPITAL_BASED_OUTPATIENT_CLINIC_OR_DEPARTMENT_OTHER): Payer: Medicare Other

## 2017-01-21 VITALS — BP 170/51 | HR 64 | Temp 98.5°F | Resp 20 | Ht 63.0 in | Wt 221.6 lb

## 2017-01-21 DIAGNOSIS — C8331 Diffuse large B-cell lymphoma, lymph nodes of head, face, and neck: Secondary | ICD-10-CM | POA: Diagnosis not present

## 2017-01-21 DIAGNOSIS — E876 Hypokalemia: Secondary | ICD-10-CM

## 2017-01-21 DIAGNOSIS — C8330 Diffuse large B-cell lymphoma, unspecified site: Secondary | ICD-10-CM

## 2017-01-21 MED ORDER — SODIUM CHLORIDE 0.9% FLUSH
10.0000 mL | Freq: Once | INTRAVENOUS | Status: AC
  Administered 2017-01-21: 10 mL via INTRAVENOUS
  Filled 2017-01-21: qty 10

## 2017-01-21 MED ORDER — HEPARIN SOD (PORK) LOCK FLUSH 100 UNIT/ML IV SOLN
500.0000 [IU] | Freq: Once | INTRAVENOUS | Status: AC
  Administered 2017-01-21: 500 [IU] via INTRAVENOUS
  Filled 2017-01-21: qty 5

## 2017-01-21 MED ORDER — HYDROCOD POLST-CPM POLST ER 10-8 MG/5ML PO SUER: 5.0000 mL | Freq: Two times a day (BID) | ORAL | 0 refills | Status: DC | PRN

## 2017-01-21 MED ORDER — SODIUM CHLORIDE 0.9 % IV SOLN
Freq: Once | INTRAVENOUS | Status: AC
  Administered 2017-01-21: 11:00:00 via INTRAVENOUS

## 2017-01-21 NOTE — Patient Instructions (Signed)

## 2017-01-21 NOTE — Progress Notes (Signed)
RN visit only for IV fluids. C/o increased coughing during the night, interferes with her sleep. Discussed with Sandi Mealy, PA. Prescription for tussionex given to pt per Sandi Mealy, PA. Reviewed instructions for this medication with pt and her husband. Both voiced understanding. They also verbalize understanding to call tomorrow if still unable to drink fluids well.

## 2017-01-23 ENCOUNTER — Other Ambulatory Visit (HOSPITAL_BASED_OUTPATIENT_CLINIC_OR_DEPARTMENT_OTHER): Payer: Medicare Other

## 2017-01-23 ENCOUNTER — Ambulatory Visit (HOSPITAL_BASED_OUTPATIENT_CLINIC_OR_DEPARTMENT_OTHER): Payer: Medicare Other

## 2017-01-23 ENCOUNTER — Ambulatory Visit: Payer: Medicare Other

## 2017-01-23 ENCOUNTER — Ambulatory Visit (HOSPITAL_BASED_OUTPATIENT_CLINIC_OR_DEPARTMENT_OTHER): Payer: Medicare Other | Admitting: Hematology and Oncology

## 2017-01-23 ENCOUNTER — Other Ambulatory Visit: Payer: Self-pay

## 2017-01-23 DIAGNOSIS — E860 Dehydration: Secondary | ICD-10-CM

## 2017-01-23 DIAGNOSIS — C50411 Malignant neoplasm of upper-outer quadrant of right female breast: Secondary | ICD-10-CM | POA: Diagnosis not present

## 2017-01-23 DIAGNOSIS — R1310 Dysphagia, unspecified: Secondary | ICD-10-CM

## 2017-01-23 DIAGNOSIS — C8331 Diffuse large B-cell lymphoma, lymph nodes of head, face, and neck: Secondary | ICD-10-CM

## 2017-01-23 DIAGNOSIS — Z170 Estrogen receptor positive status [ER+]: Secondary | ICD-10-CM

## 2017-01-23 DIAGNOSIS — C8330 Diffuse large B-cell lymphoma, unspecified site: Secondary | ICD-10-CM

## 2017-01-23 DIAGNOSIS — E876 Hypokalemia: Secondary | ICD-10-CM | POA: Diagnosis not present

## 2017-01-23 LAB — COMPREHENSIVE METABOLIC PANEL
ALBUMIN: 3 g/dL — AB (ref 3.5–5.0)
ALK PHOS: 48 U/L (ref 40–150)
ALT: 282 U/L — AB (ref 0–55)
ANION GAP: 11 meq/L (ref 3–11)
AST: 91 U/L — AB (ref 5–34)
BILIRUBIN TOTAL: 1.22 mg/dL — AB (ref 0.20–1.20)
BUN: 19.7 mg/dL (ref 7.0–26.0)
CALCIUM: 8.9 mg/dL (ref 8.4–10.4)
CO2: 24 mEq/L (ref 22–29)
CREATININE: 1.2 mg/dL — AB (ref 0.6–1.1)
Chloride: 105 mEq/L (ref 98–109)
EGFR: 47 mL/min/{1.73_m2} — ABNORMAL LOW (ref >90)
Glucose: 157 mg/dl — ABNORMAL HIGH (ref 70–140)
Potassium: 3.3 mEq/L — ABNORMAL LOW (ref 3.5–5.1)
Sodium: 140 mEq/L (ref 136–145)
TOTAL PROTEIN: 6.8 g/dL (ref 6.4–8.3)

## 2017-01-23 LAB — CBC WITH DIFFERENTIAL/PLATELET
BASO%: 3 % — ABNORMAL HIGH (ref 0.0–2.0)
Basophils Absolute: 0 10e3/uL (ref 0.0–0.1)
EOS%: 5.6 % (ref 0.0–7.0)
Eosinophils Absolute: 0 10e3/uL (ref 0.0–0.5)
HCT: 34.6 % — ABNORMAL LOW (ref 34.8–46.6)
HGB: 11.4 g/dL — ABNORMAL LOW (ref 11.6–15.9)
LYMPH%: 75.3 % — ABNORMAL HIGH (ref 14.0–49.7)
MCH: 27.9 pg (ref 25.1–34.0)
MCHC: 33 g/dL (ref 31.5–36.0)
MCV: 84.6 fL (ref 79.5–101.0)
MONO#: 0 10e3/uL — ABNORMAL LOW (ref 0.1–0.9)
MONO%: 13.6 % (ref 0.0–14.0)
NEUT#: 0 10e3/uL — CL (ref 1.5–6.5)
NEUT%: 2.5 % — ABNORMAL LOW (ref 38.4–76.8)
Platelets: 52 10e3/uL — ABNORMAL LOW (ref 145–400)
RBC: 4.09 10e6/uL (ref 3.70–5.45)
RDW: 14.3 % (ref 11.2–14.5)
WBC: 0.3 10e3/uL — CL (ref 3.9–10.3)
lymph#: 0.2 10e3/uL — ABNORMAL LOW (ref 0.9–3.3)

## 2017-01-23 MED ORDER — SODIUM CHLORIDE 0.9 % IV SOLN
Freq: Once | INTRAVENOUS | Status: DC
  Administered 2017-01-23: 12:00:00 via INTRAVENOUS
  Filled 2017-01-23: qty 1000

## 2017-01-23 MED ORDER — PALONOSETRON HCL INJECTION 0.25 MG/5ML
INTRAVENOUS | Status: AC
Start: 2017-01-23 — End: 2017-01-23
  Filled 2017-01-23: qty 5

## 2017-01-23 MED ORDER — PALONOSETRON HCL INJECTION 0.25 MG/5ML
0.2500 mg | Freq: Once | INTRAVENOUS | Status: DC
  Administered 2017-01-23: 0.25 mg via INTRAVENOUS

## 2017-01-23 MED ORDER — HEPARIN SOD (PORK) LOCK FLUSH 100 UNIT/ML IV SOLN
500.0000 [IU] | Freq: Once | INTRAVENOUS | Status: AC
  Administered 2017-01-23: 500 [IU]
  Filled 2017-01-23: qty 5

## 2017-01-23 MED ORDER — SODIUM CHLORIDE 0.9% FLUSH
10.0000 mL | Freq: Once | INTRAVENOUS | Status: AC
  Administered 2017-01-23: 10 mL
  Filled 2017-01-23: qty 10

## 2017-01-23 MED ORDER — SODIUM CHLORIDE 0.9 % IV SOLN
Freq: Once | INTRAVENOUS | Status: DC
  Administered 2017-01-23: 12:00:00 via INTRAVENOUS

## 2017-01-23 NOTE — Patient Instructions (Signed)
Dehydration, Adult Dehydration is a condition in which there is not enough fluid or water in the body. This happens when you lose more fluids than you take in. Important organs, such as the kidneys, brain, and heart, cannot function without a proper amount of fluids. Any loss of fluids from the body can lead to dehydration. Dehydration can range from mild to severe. This condition should be treated right away to prevent it from becoming severe. What are the causes? This condition may be caused by:  Vomiting.  Diarrhea.  Excessive sweating, such as from heat exposure or exercise.  Not drinking enough fluid, especially: ? When ill. ? While doing activity that requires a lot of energy.  Excessive urination.  Fever.  Infection.  Certain medicines, such as medicines that cause the body to lose excess fluid (diuretics).  Inability to access safe drinking water.  Reduced physical ability to get adequate water and food.  What increases the risk? This condition is more likely to develop in people:  Who have a poorly controlled long-term (chronic) illness, such as diabetes, heart disease, or kidney disease.  Who are age 65 or older.  Who are disabled.  Who live in a place with high altitude.  Who play endurance sports.  What are the signs or symptoms? Symptoms of mild dehydration may include:  Thirst.  Dry lips.  Slightly dry mouth.  Dry, warm skin.  Dizziness. Symptoms of moderate dehydration may include:  Very dry mouth.  Muscle cramps.  Dark urine. Urine may be the color of tea.  Decreased urine production.  Decreased tear production.  Heartbeat that is irregular or faster than normal (palpitations).  Headache.  Light-headedness, especially when you stand up from a sitting position.  Fainting (syncope). Symptoms of severe dehydration may include:  Changes in skin, such as: ? Cold and clammy skin. ? Blotchy (mottled) or pale skin. ? Skin that does  not quickly return to normal after being lightly pinched and released (poor skin turgor).  Changes in body fluids, such as: ? Extreme thirst. ? No tear production. ? Inability to sweat when body temperature is high, such as in hot weather. ? Very little urine production.  Changes in vital signs, such as: ? Weak pulse. ? Pulse that is more than 100 beats a minute when sitting still. ? Rapid breathing. ? Low blood pressure.  Other changes, such as: ? Sunken eyes. ? Cold hands and feet. ? Confusion. ? Lack of energy (lethargy). ? Difficulty waking up from sleep. ? Short-term weight loss. ? Unconsciousness. How is this diagnosed? This condition is diagnosed based on your symptoms and a physical exam. Blood and urine tests may be done to help confirm the diagnosis. How is this treated? Treatment for this condition depends on the severity. Mild or moderate dehydration can often be treated at home. Treatment should be started right away. Do not wait until dehydration becomes severe. Severe dehydration is an emergency and it needs to be treated in a hospital. Treatment for mild dehydration may include:  Drinking more fluids.  Replacing salts and minerals in your blood (electrolytes) that you may have lost. Treatment for moderate dehydration may include:  Drinking an oral rehydration solution (ORS). This is a drink that helps you replace fluids and electrolytes (rehydrate). It can be found at pharmacies and retail stores. Treatment for severe dehydration may include:  Receiving fluids through an IV tube.  Receiving an electrolyte solution through a feeding tube that is passed through your nose   and into your stomach (nasogastric tube, or NG tube).  Correcting any abnormalities in electrolytes.  Treating the underlying cause of dehydration. Follow these instructions at home:  If directed by your health care provider, drink an ORS: ? Make an ORS by following instructions on the  package. ? Start by drinking small amounts, about  cup (120 mL) every 5-10 minutes. ? Slowly increase how much you drink until you have taken the amount recommended by your health care provider.  Drink enough clear fluid to keep your urine clear or pale yellow. If you were told to drink an ORS, finish the ORS first, then start slowly drinking other clear fluids. Drink fluids such as: ? Water. Do not drink only water. Doing that can lead to having too little salt (sodium) in the body (hyponatremia). ? Ice chips. ? Fruit juice that you have added water to (diluted fruit juice). ? Low-calorie sports drinks.  Avoid: ? Alcohol. ? Drinks that contain a lot of sugar. These include high-calorie sports drinks, fruit juice that is not diluted, and soda. ? Caffeine. ? Foods that are greasy or contain a lot of fat or sugar.  Take over-the-counter and prescription medicines only as told by your health care provider.  Do not take sodium tablets. This can lead to having too much sodium in the body (hypernatremia).  Eat foods that contain a healthy balance of electrolytes, such as bananas, oranges, potatoes, tomatoes, and spinach.  Keep all follow-up visits as told by your health care provider. This is important. Contact a health care provider if:  You have abdominal pain that: ? Gets worse. ? Stays in one area (localizes).  You have a rash.  You have a stiff neck.  You are more irritable than usual.  You are sleepier or more difficult to wake up than usual.  You feel weak or dizzy.  You feel very thirsty.  You have urinated only a small amount of very dark urine over 6-8 hours. Get help right away if:  You have symptoms of severe dehydration.  You cannot drink fluids without vomiting.  Your symptoms get worse with treatment.  You have a fever.  You have a severe headache.  You have vomiting or diarrhea that: ? Gets worse. ? Does not go away.  You have blood or green matter  (bile) in your vomit.  You have blood in your stool. This may cause stool to look black and tarry.  You have not urinated in 6-8 hours.  You faint.  Your heart rate while sitting still is over 100 beats a minute.  You have trouble breathing. This information is not intended to replace advice given to you by your health care provider. Make sure you discuss any questions you have with your health care provider. Document Released: 04/07/2005 Document Revised: 11/02/2015 Document Reviewed: 06/01/2015 Elsevier Interactive Patient Education  2018 [employer]  

## 2017-01-23 NOTE — Progress Notes (Signed)
Patient Care Team: Marletta Lor, MD as PCP - General Marcy Panning, MD as Consulting Physician (Internal Medicine) Thea Silversmith, MD (Inactive) as Consulting Physician (Radiation Oncology)  DIAGNOSIS:  Encounter Diagnoses  Name Primary?  . Malignant neoplasm of upper-outer quadrant of right breast in female, estrogen receptor positive ([place])   . 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 Radiation recall related to tamoxifen      11/25/2010 -  Anti-estrogen oral therapy    Aromasin 25 mg daily.myalgias and arthralgias stop November 2012 went back to tamoxifen 20 mg daily      01/06/2017 Pathology Results    Bone marrow biopsy: Slightly hypercellular bone marrow for age with granulocytic hyperplasia, numerous small lymphoid aggregates present. This may represent minimal involvement by low-grade B-cell lymphoma or liver process like SLL/CLL because they are CD5 positive       Diffuse large B cell lymphoma ([place])   12/26/2016 Initial Diagnosis    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 positive for LCA, CD20, CD79a PAX-5, BCL-6 and weak cytoplasmic kappa associated with patchy weak positivity for BCL-2 and CD10, Ki-67 40%      01/16/2017 -  Chemotherapy    R CHOP 6 cycles       CHIEF COMPLIANT: Cycle 1 day 8R CHOP chemotherapy  INTERVAL HISTORY: Kristen Escobar is a 71 year old with above-mentioned history of paraesophageal  lymphoma who received her first cycle of chemotherapy with the R CHOP last week. She is here today for toxicity evaluation. She tolerated the chemotherapy fairly well. She did not have any nausea vomiting. She had severe hypokalemia and required potassium replacement therapy by IV. Patient had profound coughing spells are several days after chemotherapy and was coughing up yellowish and whitish sputum. Recently the cough has subsided. Her biggest issue is difficulty with swallowing liquids. As soon as liquids hit the back of the throat she starts gagging. Because of this she has not been able to eat or drink much. Even solid foods are getting difficult to swallow. Her food intake has been quite limited.  REVIEW OF SYSTEMS:   Constitutional: Complains of severe fatigue and generalized weakness Eyes: Denies blurriness of vision Ears, nose, mouth, throat, and face: Denies mucositis or sore throat Respiratory: Denies cough, dyspnea or wheezes Cardiovascular: Denies palpitation, chest discomfort Gastrointestinal:  Difficulty with swallowing, gag reflex, difficulty with swallowing liquids especially is worse than solids. Skin: Denies abnormal skin rashes Lymphatics: Denies new lymphadenopathy or easy bruising Neurological:Denies numbness, tingling or new weaknesses Behavioral/Psych: Mood is stable, no new changes  Extremities: No lower extremity edema All other systems were reviewed with the patient and are negative.  I have reviewed the past medical history, past surgical history, social history and family history with the patient and they are unchanged from previous note.  ALLERGIES:  has No Known Allergies.  MEDICATIONS:  Current Outpatient Prescriptions  Medication Sig Dispense Refill  . allopurinol (ZYLOPRIM) 300 MG  tablet Take 0.5 tablets (150 mg total) by mouth daily. 30 tablet 3  . allopurinol (ZYLOPRIM) 300 MG tablet Take 1 tablet (300 mg total) by mouth daily. 30 tablet 3  . atorvastatin  (LIPITOR) 10 MG tablet TAKE 1 TABLET EVERY DAY 90 tablet 3  . chlorpheniramine-HYDROcodone (TUSSIONEX) 10-8 MG/5ML SUER Take 5 mLs by mouth every 12 (twelve) hours as needed for cough. 240 mL 0  . HYDROcodone-acetaminophen (HYCET) 7.5-325 mg/15 ml solution Take 15 mLs by mouth 4 (four) times daily as needed for moderate pain. 480 mL 0  . HYDROcodone-acetaminophen (NORCO) 7.5-325 MG tablet Take 1 tablet by mouth every 6 (six) hours as needed for moderate pain. 20 tablet 0  . ibuprofen (ADVIL,MOTRIN) 200 MG tablet Take 400 mg by mouth every 8 (eight) hours as needed for mild pain or moderate pain (as needed).     Marland Kitchen levothyroxine (SYNTHROID, LEVOTHROID) 50 MCG tablet TAKE 1 TABLET BY MOUTH DAILY 90 tablet 1  . lidocaine (XYLOCAINE) 2 % solution Use as directed 20 mLs in the mouth or throat every 3 (three) hours as needed for mouth pain. 100 mL 3  . lidocaine-prilocaine (EMLA) cream Apply to affected area once 30 g 3  . LORazepam (ATIVAN) 0.5 MG tablet Take 1 tablet (0.5 mg total) by mouth every 6 (six) hours as needed (Nausea or vomiting). 30 tablet 0  . losartan-hydrochlorothiazide (HYZAAR) 100-25 MG tablet TAKE 1 TABLET BY MOUTH EVERY DAY 90 tablet 1  . magic mouthwash w/lidocaine SOLN Take 5 mLs by mouth 3 (three) times daily as needed for mouth pain. 100 mL 1  . omeprazole (PRILOSEC) 20 MG capsule TAKE 1 CAPSULE BY MOUTH EVERY DAY 30 capsule 3  . ondansetron (ZOFRAN) 8 MG tablet Take 1 tablet (8 mg total) by mouth 2 (two) times daily as needed for refractory nausea / vomiting. Start on day 3 after cyclophosphamide chemotherapy. 30 tablet 1  . predniSONE (DELTASONE) 20 MG tablet Take 3 tablets (60 mg total) by mouth daily. Take on days 1-5 of chemotherapy. 90 tablet 0  . prochlorperazine (COMPAZINE) 10 MG tablet Take 1 tablet (10 mg total) by mouth every 6 (six) hours as needed (Nausea or vomiting). (Patient not taking: Reported on 01/20/2017) 30 tablet 6  . promethazine (PHENERGAN) 25 MG suppository  Place 1 suppository (25 mg total) rectally every 6 (six) hours as needed for nausea or vomiting. (Patient not taking: Reported on 01/20/2017) 12 suppository 1  . sucralfate (CARAFATE) 1 GM/10ML suspension Take 10 mLs (1 g total) by mouth 4 (four) times daily -  with meals and at bedtime. 420 mL 3   No current facility-administered medications for this visit.    Facility-Administered Medications Ordered in Other Visits  Medication Dose Route Frequency Provider Last Rate Last Dose  . palonosetron (ALOXI) injection 0.25 mg  0.25 mg Intravenous Once Nicholas Lose, MD      . sodium chloride 0.9 % 1,000 mL with potassium chloride 10 mEq infusion   Intravenous Once Nicholas Lose, MD        PHYSICAL EXAMINATION: ECOG PERFORMANCE STATUS: 1 - Symptomatic but completely ambulatory  Vitals:   01/23/17 1010  BP: (!) 147/55  Pulse: 89  Resp: 18  Temp: 98.2 F (36.8 C)  SpO2: 97%   Filed Weights   01/23/17 1010  Weight: 216 lb (98 kg)    GENERAL:alert, no distress and comfortable SKIN: skin color, texture, turgor are normal, no rashes or significant lesions EYES: normal, Conjunctiva are pink and  non-injected, sclera clear OROPHARYNX:Dry mouth  NECK: Severe dysphagia, dry mouth, inability to eat or drink LYMPH:  no palpable lymphadenopathy in the cervical, axillary or inguinal LUNGS: clear to auscultation and percussion with normal breathing effort HEART: regular rate & rhythm and no murmurs and no lower extremity edema ABDOMEN:abdomen soft, non-tender and normal bowel sounds MUSCULOSKELETAL:no cyanosis of digits and no clubbing  NEURO: alert & oriented x 3 with fluent speech, no focal motor/sensory deficits EXTREMITIES: No lower extremity edema  LABORATORY DATA:  I have reviewed the data as listed   Chemistry      Component Value Date/Time   NA 140 01/23/2017 0957   K 3.3 (L) 01/23/2017 0957   CL 101 12/24/2016 1133   CL 105 03/10/2012 0859   CO2 24 01/23/2017 0957   BUN 19.7  01/23/2017 0957   CREATININE 1.2 (H) 01/23/2017 0957      Component Value Date/Time   CALCIUM 8.9 01/23/2017 0957   ALKPHOS 48 01/23/2017 0957   AST 91 (H) 01/23/2017 0957   ALT 282 (HH) 01/23/2017 0957   BILITOT 1.22 (H) 01/23/2017 0957       Lab Results  Component Value Date   WBC 0.3 (LL) 01/23/2017   HGB 11.4 (L) 01/23/2017   HCT 34.6 (L) 01/23/2017   MCV 84.6 01/23/2017   PLT 52 (L) 01/23/2017   NEUTROABS 0.0 (LL) 01/23/2017    ASSESSMENT & PLAN:  Breast cancer of upper-outer quadrant of right female breast In remission  Diffuse large B cell lymphoma ([place]) 12/26/2016 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 positive for LCA, CD20, CD79a PAX-5, BCL-6 and weak cytoplasmic kappa associated with patchy weak positivity for BCL-2 and CD10, Ki-67 40%  Bone marrow biopsy negative for diffuse large B cell lymphoma but suggestive of small low-grade B cell population possibly SLL/CLL Revised IPI score: 1, good prognosis, redictated for years PFS 80%, predicted overall survival 79% ------------------------------------------------------------------------ Current treatment: R CHOP 6 cycles today is cycle 1 day 8 Chemotherapy toxicities: 1. Hypokalemia: Giving potassium replacement therapy by IV 2. pharyngoesophageal dysphagia 3. Difficulty with swallowing liquids: Patient's profound gag reflex. We will give her a dose of Aloxi along with IV fluids today. 4. Clinical dehydration: We will give her IV fluids today as well as Saturday and next week Tuesday and Thursday.  If her dysphagia symptoms continue beyond the next 2 weeks and we will have to refer her to gastroenterology. I plan to give her IV fluids on Saturday as well as next week Tuesday and Thursday.  Return to clinic in 2 weeks for cycle 2  I spent 25 minutes talking to the patient of which more than half was spent in counseling and coordination of care.  No orders of the defined types  were placed in this encounter.  The patient has a good understanding of the overall plan. she agrees with it. she will call with any problems that may develop before the next visit here.   Rulon Eisenmenger, MD 01/23/17

## 2017-01-23 NOTE — Assessment & Plan Note (Signed)
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 positive for LCA, CD20, CD79a PAX-5, BCL-6 and weak cytoplasmic kappa associated with patchy weak positivity for BCL-2 and CD10, Ki-67 40%  Bone marrow biopsy negative for diffuse large B cell lymphoma but suggestive of small low-grade B cell population possibly SLL/CLL  Revised IPI score: 1, good prognosis, redictated for years PFS 80%, predicted overall survival 79% ------------------------------------------------------------------------ Current treatment: R CHOP 6 cycles today is cycle 1 day 8 Chemotherapy toxicities: 1. Hypokalemia: Given potassium replacement therapy by IV 2. pharyngoesophageal dysphagia  Return to clinic in 2 weeks for cycle 2

## 2017-01-23 NOTE — Progress Notes (Signed)
Per Dr.Gudena, pt to receive 1L IVF's today with 65meq of potassium over 2hrs. Pt also having some nausea and will give aloxi with today's infusion. Spoke with symptom management RN and notified of pt IVF orders. Sent high priority schedule msg to add pt appt to [facility] infusion today after MD appt.

## 2017-01-24 ENCOUNTER — Ambulatory Visit (HOSPITAL_BASED_OUTPATIENT_CLINIC_OR_DEPARTMENT_OTHER): Payer: Medicare Other

## 2017-01-24 VITALS — BP 155/46 | HR 70 | Temp 98.6°F | Resp 18

## 2017-01-24 DIAGNOSIS — Z95828 Presence of other vascular implants and grafts: Secondary | ICD-10-CM

## 2017-01-24 DIAGNOSIS — C50411 Malignant neoplasm of upper-outer quadrant of right female breast: Secondary | ICD-10-CM

## 2017-01-24 DIAGNOSIS — E860 Dehydration: Secondary | ICD-10-CM

## 2017-01-24 DIAGNOSIS — T80212S Local infection due to central venous catheter, sequela: Secondary | ICD-10-CM

## 2017-01-24 DIAGNOSIS — Z170 Estrogen receptor positive status [ER+]: Secondary | ICD-10-CM

## 2017-01-24 MED ORDER — SODIUM CHLORIDE 0.9 % IV SOLN
Freq: Once | INTRAVENOUS | Status: AC
  Administered 2017-01-24: 09:00:00 via INTRAVENOUS

## 2017-01-24 MED ORDER — HEPARIN SOD (PORK) LOCK FLUSH 100 UNIT/ML IV SOLN
500.0000 [IU] | Freq: Once | INTRAVENOUS | Status: AC
  Administered 2017-01-24: 500 [IU]
  Filled 2017-01-24: qty 5

## 2017-01-24 MED ORDER — SODIUM CHLORIDE 0.9% FLUSH
10.0000 mL | Freq: Once | INTRAVENOUS | Status: AC
  Administered 2017-01-24: 10 mL
  Filled 2017-01-24: qty 10

## 2017-01-24 NOTE — Patient Instructions (Signed)

## 2017-01-27 ENCOUNTER — Ambulatory Visit (HOSPITAL_BASED_OUTPATIENT_CLINIC_OR_DEPARTMENT_OTHER): Payer: Medicare Other

## 2017-01-27 ENCOUNTER — Other Ambulatory Visit: Payer: Self-pay

## 2017-01-27 VITALS — BP 163/45 | HR 88 | Temp 98.8°F | Resp 20 | Ht 63.0 in | Wt 215.8 lb

## 2017-01-27 DIAGNOSIS — C8330 Diffuse large B-cell lymphoma, unspecified site: Secondary | ICD-10-CM

## 2017-01-27 DIAGNOSIS — E860 Dehydration: Secondary | ICD-10-CM

## 2017-01-27 DIAGNOSIS — C8331 Diffuse large B-cell lymphoma, lymph nodes of head, face, and neck: Secondary | ICD-10-CM

## 2017-01-27 DIAGNOSIS — Z170 Estrogen receptor positive status [ER+]: Principal | ICD-10-CM

## 2017-01-27 DIAGNOSIS — C50411 Malignant neoplasm of upper-outer quadrant of right female breast: Secondary | ICD-10-CM

## 2017-01-27 MED ORDER — SODIUM CHLORIDE 0.9 % IV SOLN
Freq: Once | INTRAVENOUS | Status: AC
  Administered 2017-01-27: 10:00:00 via INTRAVENOUS

## 2017-01-27 MED ORDER — SODIUM CHLORIDE 0.9% FLUSH
10.0000 mL | Freq: Once | INTRAVENOUS | Status: AC
  Administered 2017-01-27: 10 mL via INTRAVENOUS
  Filled 2017-01-27: qty 10

## 2017-01-27 MED ORDER — HEPARIN SOD (PORK) LOCK FLUSH 100 UNIT/ML IV SOLN
500.0000 [IU] | Freq: Once | INTRAVENOUS | Status: AC
  Administered 2017-01-27: 500 [IU] via INTRAVENOUS
  Filled 2017-01-27: qty 5

## 2017-01-27 NOTE — Progress Notes (Signed)
RN visit only for IV fluids.   Pt reports that she is starting to swallow better. Able to continue ice chips all day, pepsi, ensure and even had cereal with milk today.  Denies fever chills

## 2017-01-27 NOTE — Patient Instructions (Signed)
Dehydration, Adult Dehydration is a condition in which there is not enough fluid or water in the body. This happens when you lose more fluids than you take in. Important organs, such as the kidneys, brain, and heart, cannot function without a proper amount of fluids. Any loss of fluids from the body can lead to dehydration. Dehydration can range from mild to severe. This condition should be treated right away to prevent it from becoming severe. What are the causes? This condition may be caused by:  Vomiting.  Diarrhea.  Excessive sweating, such as from heat exposure or exercise.  Not drinking enough fluid, especially: ? When ill. ? While doing activity that requires a lot of energy.  Excessive urination.  Fever.  Infection.  Certain medicines, such as medicines that cause the body to lose excess fluid (diuretics).  Inability to access safe drinking water.  Reduced physical ability to get adequate water and food.  What increases the risk? This condition is more likely to develop in people:  Who have a poorly controlled long-term (chronic) illness, such as diabetes, heart disease, or kidney disease.  Who are age 65 or older.  Who are disabled.  Who live in a place with high altitude.  Who play endurance sports.  What are the signs or symptoms? Symptoms of mild dehydration may include:  Thirst.  Dry lips.  Slightly dry mouth.  Dry, warm skin.  Dizziness. Symptoms of moderate dehydration may include:  Very dry mouth.  Muscle cramps.  Dark urine. Urine may be the color of tea.  Decreased urine production.  Decreased tear production.  Heartbeat that is irregular or faster than normal (palpitations).  Headache.  Light-headedness, especially when you stand up from a sitting position.  Fainting (syncope). Symptoms of severe dehydration may include:  Changes in skin, such as: ? Cold and clammy skin. ? Blotchy (mottled) or pale skin. ? Skin that does  not quickly return to normal after being lightly pinched and released (poor skin turgor).  Changes in body fluids, such as: ? Extreme thirst. ? No tear production. ? Inability to sweat when body temperature is high, such as in hot weather. ? Very little urine production.  Changes in vital signs, such as: ? Weak pulse. ? Pulse that is more than 100 beats a minute when sitting still. ? Rapid breathing. ? Low blood pressure.  Other changes, such as: ? Sunken eyes. ? Cold hands and feet. ? Confusion. ? Lack of energy (lethargy). ? Difficulty waking up from sleep. ? Short-term weight loss. ? Unconsciousness. How is this diagnosed? This condition is diagnosed based on your symptoms and a physical exam. Blood and urine tests may be done to help confirm the diagnosis. How is this treated? Treatment for this condition depends on the severity. Mild or moderate dehydration can often be treated at home. Treatment should be started right away. Do not wait until dehydration becomes severe. Severe dehydration is an emergency and it needs to be treated in a hospital. Treatment for mild dehydration may include:  Drinking more fluids.  Replacing salts and minerals in your blood (electrolytes) that you may have lost. Treatment for moderate dehydration may include:  Drinking an oral rehydration solution (ORS). This is a drink that helps you replace fluids and electrolytes (rehydrate). It can be found at pharmacies and retail stores. Treatment for severe dehydration may include:  Receiving fluids through an IV tube.  Receiving an electrolyte solution through a feeding tube that is passed through your nose   and into your stomach (nasogastric tube, or NG tube).  Correcting any abnormalities in electrolytes.  Treating the underlying cause of dehydration. Follow these instructions at home:  If directed by your health care provider, drink an ORS: ? Make an ORS by following instructions on the  package. ? Start by drinking small amounts, about  cup (120 mL) every 5-10 minutes. ? Slowly increase how much you drink until you have taken the amount recommended by your health care provider.  Drink enough clear fluid to keep your urine clear or pale yellow. If you were told to drink an ORS, finish the ORS first, then start slowly drinking other clear fluids. Drink fluids such as: ? Water. Do not drink only water. Doing that can lead to having too little salt (sodium) in the body (hyponatremia). ? Ice chips. ? Fruit juice that you have added water to (diluted fruit juice). ? Low-calorie sports drinks.  Avoid: ? Alcohol. ? Drinks that contain a lot of sugar. These include high-calorie sports drinks, fruit juice that is not diluted, and soda. ? Caffeine. ? Foods that are greasy or contain a lot of fat or sugar.  Take over-the-counter and prescription medicines only as told by your health care provider.  Do not take sodium tablets. This can lead to having too much sodium in the body (hypernatremia).  Eat foods that contain a healthy balance of electrolytes, such as bananas, oranges, potatoes, tomatoes, and spinach.  Keep all follow-up visits as told by your health care provider. This is important. Contact a health care provider if:  You have abdominal pain that: ? Gets worse. ? Stays in one area (localizes).  You have a rash.  You have a stiff neck.  You are more irritable than usual.  You are sleepier or more difficult to wake up than usual.  You feel weak or dizzy.  You feel very thirsty.  You have urinated only a small amount of very dark urine over 6-8 hours. Get help right away if:  You have symptoms of severe dehydration.  You cannot drink fluids without vomiting.  Your symptoms get worse with treatment.  You have a fever.  You have a severe headache.  You have vomiting or diarrhea that: ? Gets worse. ? Does not go away.  You have blood or green matter  (bile) in your vomit.  You have blood in your stool. This may cause stool to look black and tarry.  You have not urinated in 6-8 hours.  You faint.  Your heart rate while sitting still is over 100 beats a minute.  You have trouble breathing. This information is not intended to replace advice given to you by your health care provider. Make sure you discuss any questions you have with your health care provider. Document Released: 04/07/2005 Document Revised: 11/02/2015 Document Reviewed: 06/01/2015 Elsevier Interactive Patient Education  2018 [employer]  

## 2017-01-29 ENCOUNTER — Ambulatory Visit: Payer: Medicare Other

## 2017-01-30 ENCOUNTER — Other Ambulatory Visit: Payer: Medicare Other

## 2017-01-30 ENCOUNTER — Other Ambulatory Visit: Payer: Self-pay | Admitting: Medical

## 2017-01-30 ENCOUNTER — Ambulatory Visit: Payer: Medicare Other

## 2017-01-30 ENCOUNTER — Ambulatory Visit (HOSPITAL_BASED_OUTPATIENT_CLINIC_OR_DEPARTMENT_OTHER): Payer: Medicare Other

## 2017-01-30 ENCOUNTER — Other Ambulatory Visit (HOSPITAL_BASED_OUTPATIENT_CLINIC_OR_DEPARTMENT_OTHER): Payer: Medicare Other

## 2017-01-30 VITALS — BP 158/64 | HR 98 | Temp 98.6°F | Resp 18 | Ht 63.0 in | Wt 214.6 lb

## 2017-01-30 DIAGNOSIS — E876 Hypokalemia: Secondary | ICD-10-CM | POA: Diagnosis not present

## 2017-01-30 DIAGNOSIS — Z170 Estrogen receptor positive status [ER+]: Secondary | ICD-10-CM

## 2017-01-30 DIAGNOSIS — T80212S Local infection due to central venous catheter, sequela: Secondary | ICD-10-CM

## 2017-01-30 DIAGNOSIS — C50411 Malignant neoplasm of upper-outer quadrant of right female breast: Secondary | ICD-10-CM

## 2017-01-30 DIAGNOSIS — C8331 Diffuse large B-cell lymphoma, lymph nodes of head, face, and neck: Secondary | ICD-10-CM

## 2017-01-30 DIAGNOSIS — Z95828 Presence of other vascular implants and grafts: Secondary | ICD-10-CM

## 2017-01-30 LAB — CBC WITH DIFFERENTIAL/PLATELET
BASO%: 0.3 % (ref 0.0–2.0)
BASOS ABS: 0 10*3/uL (ref 0.0–0.1)
EOS ABS: 0 10*3/uL (ref 0.0–0.5)
EOS%: 0.1 % (ref 0.0–7.0)
HCT: 32.1 % — ABNORMAL LOW (ref 34.8–46.6)
HGB: 10.8 g/dL — ABNORMAL LOW (ref 11.6–15.9)
LYMPH%: 11.1 % — AB (ref 14.0–49.7)
MCH: 28.6 pg (ref 25.1–34.0)
MCHC: 33.6 g/dL (ref 31.5–36.0)
MCV: 85 fL (ref 79.5–101.0)
MONO#: 0.6 10*3/uL (ref 0.1–0.9)
MONO%: 7.5 % (ref 0.0–14.0)
NEUT#: 6.5 10*3/uL (ref 1.5–6.5)
NEUT%: 81 % — AB (ref 38.4–76.8)
PLATELETS: 358 10*3/uL (ref 145–400)
RBC: 3.78 10*6/uL (ref 3.70–5.45)
RDW: 14.6 % — ABNORMAL HIGH (ref 11.2–14.5)
WBC: 8 10*3/uL (ref 3.9–10.3)
lymph#: 0.9 10*3/uL (ref 0.9–3.3)

## 2017-01-30 LAB — COMPREHENSIVE METABOLIC PANEL
ALT: 100 U/L — ABNORMAL HIGH (ref 0–55)
ANION GAP: 13 meq/L — AB (ref 3–11)
AST: 63 U/L — ABNORMAL HIGH (ref 5–34)
Albumin: 2.8 g/dL — ABNORMAL LOW (ref 3.5–5.0)
Alkaline Phosphatase: 45 U/L (ref 40–150)
BILIRUBIN TOTAL: 0.59 mg/dL (ref 0.20–1.20)
BUN: 17.2 mg/dL (ref 7.0–26.0)
CO2: 26 meq/L (ref 22–29)
CREATININE: 1.6 mg/dL — AB (ref 0.6–1.1)
Calcium: 8.8 mg/dL (ref 8.4–10.4)
Chloride: 100 mEq/L (ref 98–109)
EGFR: 32 mL/min/{1.73_m2} — AB (ref >60)
GLUCOSE: 156 mg/dL — AB (ref 70–140)
Potassium: 2.6 mEq/L — CL (ref 3.5–5.1)
SODIUM: 140 meq/L (ref 136–145)
Total Protein: 6.6 g/dL (ref 6.4–8.3)

## 2017-01-30 MED ORDER — SODIUM CHLORIDE 0.9 % IV SOLN
Freq: Once | INTRAVENOUS | Status: DC
  Filled 2017-01-30: qty 500

## 2017-01-30 MED ORDER — HEPARIN SOD (PORK) LOCK FLUSH 100 UNIT/ML IV SOLN
500.0000 [IU] | Freq: Once | INTRAVENOUS | Status: AC
  Administered 2017-01-30: 500 [IU]
  Filled 2017-01-30: qty 5

## 2017-01-30 MED ORDER — SODIUM CHLORIDE 0.9 % IV SOLN
30.0000 meq | Freq: Once | INTRAVENOUS | Status: DC
  Administered 2017-01-30: 30 meq via INTRAVENOUS

## 2017-01-30 MED ORDER — SODIUM CHLORIDE 0.9 % IV SOLN
Freq: Once | INTRAVENOUS | Status: AC
  Administered 2017-01-30: 10:00:00 via INTRAVENOUS

## 2017-01-30 MED ORDER — POTASSIUM PHOSPHATES 15 MMOLE/5ML IV SOLN: 30.0000 meq | Freq: Once | INTRAVENOUS | Status: DC

## 2017-01-30 MED ORDER — SODIUM CHLORIDE 0.9% FLUSH
10.0000 mL | Freq: Once | INTRAVENOUS | Status: AC
  Administered 2017-01-30: 10 mL
  Filled 2017-01-30: qty 10

## 2017-01-30 MED ORDER — SODIUM CHLORIDE 0.9 % IV SOLN: 30.0000 meq | Freq: Once | INTRAVENOUS | Status: DC

## 2017-01-30 MED ORDER — HYDROCOD POLST-CPM POLST ER 10-8 MG/5ML PO SUER: 5.0000 mL | Freq: Two times a day (BID) | ORAL | 0 refills | Status: DC | PRN

## 2017-01-30 NOTE — Patient Instructions (Signed)

## 2017-01-30 NOTE — Addendum Note (Signed)
Addended by: Aura Fey A on: 01/30/2017 06:02 PM   Modules accepted: Orders

## 2017-01-30 NOTE — Progress Notes (Signed)
Pt is drinking fluids a little bit better though only up to 20-24 oz/day.  Able to eat cerealwith milk, some yogurt with meds. All swallowing takes time.  Denies pain. Cough medicine (Tussionex) is very helpful @ night. Requesting refill. Made Sandi Mealy, PA aware of refill need.  Labs revealed K+ of 2.6  Dr. Lindi Adie and Sandi Mealy, PA made aware. Received order from  Sandi Mealy, PA for IV K+  Educated pt on foods high in K+, and encouraged more po fluids.  Tolerated IV fluids well.  Pt to come for additional IVF on Monday and Wednesday of next week.  Labs and appt with Dr. Lindi Adie next Friday.

## 2017-02-02 ENCOUNTER — Ambulatory Visit (HOSPITAL_BASED_OUTPATIENT_CLINIC_OR_DEPARTMENT_OTHER): Payer: Medicare Other

## 2017-02-02 VITALS — BP 127/54 | HR 95 | Temp 98.7°F | Resp 18 | Ht 63.0 in | Wt 215.4 lb

## 2017-02-02 DIAGNOSIS — C8331 Diffuse large B-cell lymphoma, lymph nodes of head, face, and neck: Secondary | ICD-10-CM

## 2017-02-02 DIAGNOSIS — T80212S Local infection due to central venous catheter, sequela: Secondary | ICD-10-CM

## 2017-02-02 DIAGNOSIS — Z170 Estrogen receptor positive status [ER+]: Secondary | ICD-10-CM

## 2017-02-02 DIAGNOSIS — Z5189 Encounter for other specified aftercare: Secondary | ICD-10-CM | POA: Diagnosis not present

## 2017-02-02 DIAGNOSIS — Z95828 Presence of other vascular implants and grafts: Secondary | ICD-10-CM

## 2017-02-02 DIAGNOSIS — C50411 Malignant neoplasm of upper-outer quadrant of right female breast: Secondary | ICD-10-CM

## 2017-02-02 MED ORDER — SODIUM CHLORIDE 0.9% FLUSH
10.0000 mL | Freq: Once | INTRAVENOUS | Status: AC
  Administered 2017-02-02: 10 mL
  Filled 2017-02-02: qty 10

## 2017-02-02 MED ORDER — SODIUM CHLORIDE 0.9 % IV SOLN
Freq: Once | INTRAVENOUS | Status: AC
  Administered 2017-02-02: 11:00:00 via INTRAVENOUS

## 2017-02-02 MED ORDER — HEPARIN SOD (PORK) LOCK FLUSH 100 UNIT/ML IV SOLN
500.0000 [IU] | Freq: Once | INTRAVENOUS | Status: AC
  Administered 2017-02-02: 500 [IU]
  Filled 2017-02-02: qty 5

## 2017-02-02 NOTE — Patient Instructions (Signed)
Dehydration, Adult Dehydration is a condition in which there is not enough fluid or water in the body. This happens when you lose more fluids than you take in. Important organs, such as the kidneys, brain, and heart, cannot function without a proper amount of fluids. Any loss of fluids from the body can lead to dehydration. Dehydration can range from mild to severe. This condition should be treated right away to prevent it from becoming severe. What are the causes? This condition may be caused by:  Vomiting.  Diarrhea.  Excessive sweating, such as from heat exposure or exercise.  Not drinking enough fluid, especially: ? When ill. ? While doing activity that requires a lot of energy.  Excessive urination.  Fever.  Infection.  Certain medicines, such as medicines that cause the body to lose excess fluid (diuretics).  Inability to access safe drinking water.  Reduced physical ability to get adequate water and food.  What increases the risk? This condition is more likely to develop in people:  Who have a poorly controlled long-term (chronic) illness, such as diabetes, heart disease, or kidney disease.  Who are age 65 or older.  Who are disabled.  Who live in a place with high altitude.  Who play endurance sports.  What are the signs or symptoms? Symptoms of mild dehydration may include:  Thirst.  Dry lips.  Slightly dry mouth.  Dry, warm skin.  Dizziness. Symptoms of moderate dehydration may include:  Very dry mouth.  Muscle cramps.  Dark urine. Urine may be the color of tea.  Decreased urine production.  Decreased tear production.  Heartbeat that is irregular or faster than normal (palpitations).  Headache.  Light-headedness, especially when you stand up from a sitting position.  Fainting (syncope). Symptoms of severe dehydration may include:  Changes in skin, such as: ? Cold and clammy skin. ? Blotchy (mottled) or pale skin. ? Skin that does  not quickly return to normal after being lightly pinched and released (poor skin turgor).  Changes in body fluids, such as: ? Extreme thirst. ? No tear production. ? Inability to sweat when body temperature is high, such as in hot weather. ? Very little urine production.  Changes in vital signs, such as: ? Weak pulse. ? Pulse that is more than 100 beats a minute when sitting still. ? Rapid breathing. ? Low blood pressure.  Other changes, such as: ? Sunken eyes. ? Cold hands and feet. ? Confusion. ? Lack of energy (lethargy). ? Difficulty waking up from sleep. ? Short-term weight loss. ? Unconsciousness. How is this diagnosed? This condition is diagnosed based on your symptoms and a physical exam. Blood and urine tests may be done to help confirm the diagnosis. How is this treated? Treatment for this condition depends on the severity. Mild or moderate dehydration can often be treated at home. Treatment should be started right away. Do not wait until dehydration becomes severe. Severe dehydration is an emergency and it needs to be treated in a hospital. Treatment for mild dehydration may include:  Drinking more fluids.  Replacing salts and minerals in your blood (electrolytes) that you may have lost. Treatment for moderate dehydration may include:  Drinking an oral rehydration solution (ORS). This is a drink that helps you replace fluids and electrolytes (rehydrate). It can be found at pharmacies and retail stores. Treatment for severe dehydration may include:  Receiving fluids through an IV tube.  Receiving an electrolyte solution through a feeding tube that is passed through your nose   and into your stomach (nasogastric tube, or NG tube).  Correcting any abnormalities in electrolytes.  Treating the underlying cause of dehydration. Follow these instructions at home:  If directed by your health care provider, drink an ORS: ? Make an ORS by following instructions on the  package. ? Start by drinking small amounts, about  cup (120 mL) every 5-10 minutes. ? Slowly increase how much you drink until you have taken the amount recommended by your health care provider.  Drink enough clear fluid to keep your urine clear or pale yellow. If you were told to drink an ORS, finish the ORS first, then start slowly drinking other clear fluids. Drink fluids such as: ? Water. Do not drink only water. Doing that can lead to having too little salt (sodium) in the body (hyponatremia). ? Ice chips. ? Fruit juice that you have added water to (diluted fruit juice). ? Low-calorie sports drinks.  Avoid: ? Alcohol. ? Drinks that contain a lot of sugar. These include high-calorie sports drinks, fruit juice that is not diluted, and soda. ? Caffeine. ? Foods that are greasy or contain a lot of fat or sugar.  Take over-the-counter and prescription medicines only as told by your health care provider.  Do not take sodium tablets. This can lead to having too much sodium in the body (hypernatremia).  Eat foods that contain a healthy balance of electrolytes, such as bananas, oranges, potatoes, tomatoes, and spinach.  Keep all follow-up visits as told by your health care provider. This is important. Contact a health care provider if:  You have abdominal pain that: ? Gets worse. ? Stays in one area (localizes).  You have a rash.  You have a stiff neck.  You are more irritable than usual.  You are sleepier or more difficult to wake up than usual.  You feel weak or dizzy.  You feel very thirsty.  You have urinated only a small amount of very dark urine over 6-8 hours. Get help right away if:  You have symptoms of severe dehydration.  You cannot drink fluids without vomiting.  Your symptoms get worse with treatment.  You have a fever.  You have a severe headache.  You have vomiting or diarrhea that: ? Gets worse. ? Does not go away.  You have blood or green matter  (bile) in your vomit.  You have blood in your stool. This may cause stool to look black and tarry.  You have not urinated in 6-8 hours.  You faint.  Your heart rate while sitting still is over 100 beats a minute.  You have trouble breathing. This information is not intended to replace advice given to you by your health care provider. Make sure you discuss any questions you have with your health care provider. Document Released: 04/07/2005 Document Revised: 11/02/2015 Document Reviewed: 06/01/2015 Elsevier Interactive Patient Education  2018 [employer]  

## 2017-02-03 MED FILL — Sodium Chloride IV Soln 0.9%: INTRAVENOUS | Qty: 250 | Status: AC

## 2017-02-03 NOTE — Assessment & Plan Note (Signed)
Diffuse large B cell lymphoma ([place]) 12/26/2016 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 positive for LCA, CD20, CD79a PAX-5, BCL-6 and weak cytoplasmic kappa associated with patchy weak positivity for BCL-2 and CD10, Ki-67 40%  Bone marrow biopsy negative for diffuse large B cell lymphoma but suggestive of small low-grade B cell population possibly SLL/CLL Revised IPI score: 1, good prognosis, redictated for years PFS 80%, predicted overall survival 79% ------------------------------------------------------------------------ Current treatment: R CHOP 6 cycles today is cycle 2 day 1 Chemotherapy toxicities: 1. Hypokalemia: Giving potassium replacement therapy by IV 2. pharyngoesophageal dysphagia 3. Difficulty with swallowing liquids: Patient's profound gag reflex.  4. Clinical dehydration:given IV fluids  Return to clinic in 3 weeks for cycle 3

## 2017-02-04 ENCOUNTER — Ambulatory Visit (HOSPITAL_BASED_OUTPATIENT_CLINIC_OR_DEPARTMENT_OTHER): Payer: Medicare Other

## 2017-02-04 ENCOUNTER — Encounter (HOSPITAL_COMMUNITY): Payer: Self-pay

## 2017-02-04 VITALS — BP 164/58 | HR 111 | Temp 98.9°F | Resp 18 | Ht 63.0 in | Wt 214.4 lb

## 2017-02-04 DIAGNOSIS — T80212S Local infection due to central venous catheter, sequela: Secondary | ICD-10-CM

## 2017-02-04 DIAGNOSIS — Z170 Estrogen receptor positive status [ER+]: Secondary | ICD-10-CM

## 2017-02-04 DIAGNOSIS — C8331 Diffuse large B-cell lymphoma, lymph nodes of head, face, and neck: Secondary | ICD-10-CM | POA: Diagnosis not present

## 2017-02-04 DIAGNOSIS — C50411 Malignant neoplasm of upper-outer quadrant of right female breast: Secondary | ICD-10-CM

## 2017-02-04 DIAGNOSIS — Z95828 Presence of other vascular implants and grafts: Secondary | ICD-10-CM

## 2017-02-04 LAB — CHROMOSOME ANALYSIS, BONE MARROW

## 2017-02-04 MED ORDER — SODIUM CHLORIDE 0.9% FLUSH
10.0000 mL | Freq: Once | INTRAVENOUS | Status: AC
  Administered 2017-02-04: 10 mL
  Filled 2017-02-04: qty 10

## 2017-02-04 MED ORDER — HEPARIN SOD (PORK) LOCK FLUSH 100 UNIT/ML IV SOLN
500.0000 [IU] | Freq: Once | INTRAVENOUS | Status: AC
  Administered 2017-02-04: 500 [IU]
  Filled 2017-02-04: qty 5

## 2017-02-04 MED ORDER — SODIUM CHLORIDE 0.9 % IV SOLN
Freq: Once | INTRAVENOUS | Status: AC
  Administered 2017-02-04: 10:00:00 via INTRAVENOUS

## 2017-02-04 NOTE — Patient Instructions (Signed)
Dehydration, Adult Dehydration is a condition in which there is not enough fluid or water in the body. This happens when you lose more fluids than you take in. Important organs, such as the kidneys, brain, and heart, cannot function without a proper amount of fluids. Any loss of fluids from the body can lead to dehydration. Dehydration can range from mild to severe. This condition should be treated right away to prevent it from becoming severe. What are the causes? This condition may be caused by:  Vomiting.  Diarrhea.  Excessive sweating, such as from heat exposure or exercise.  Not drinking enough fluid, especially: ? When ill. ? While doing activity that requires a lot of energy.  Excessive urination.  Fever.  Infection.  Certain medicines, such as medicines that cause the body to lose excess fluid (diuretics).  Inability to access safe drinking water.  Reduced physical ability to get adequate water and food.  What increases the risk? This condition is more likely to develop in people:  Who have a poorly controlled long-term (chronic) illness, such as diabetes, heart disease, or kidney disease.  Who are age 65 or older.  Who are disabled.  Who live in a place with high altitude.  Who play endurance sports.  What are the signs or symptoms? Symptoms of mild dehydration may include:  Thirst.  Dry lips.  Slightly dry mouth.  Dry, warm skin.  Dizziness. Symptoms of moderate dehydration may include:  Very dry mouth.  Muscle cramps.  Dark urine. Urine may be the color of tea.  Decreased urine production.  Decreased tear production.  Heartbeat that is irregular or faster than normal (palpitations).  Headache.  Light-headedness, especially when you stand up from a sitting position.  Fainting (syncope). Symptoms of severe dehydration may include:  Changes in skin, such as: ? Cold and clammy skin. ? Blotchy (mottled) or pale skin. ? Skin that does  not quickly return to normal after being lightly pinched and released (poor skin turgor).  Changes in body fluids, such as: ? Extreme thirst. ? No tear production. ? Inability to sweat when body temperature is high, such as in hot weather. ? Very little urine production.  Changes in vital signs, such as: ? Weak pulse. ? Pulse that is more than 100 beats a minute when sitting still. ? Rapid breathing. ? Low blood pressure.  Other changes, such as: ? Sunken eyes. ? Cold hands and feet. ? Confusion. ? Lack of energy (lethargy). ? Difficulty waking up from sleep. ? Short-term weight loss. ? Unconsciousness. How is this diagnosed? This condition is diagnosed based on your symptoms and a physical exam. Blood and urine tests may be done to help confirm the diagnosis. How is this treated? Treatment for this condition depends on the severity. Mild or moderate dehydration can often be treated at home. Treatment should be started right away. Do not wait until dehydration becomes severe. Severe dehydration is an emergency and it needs to be treated in a hospital. Treatment for mild dehydration may include:  Drinking more fluids.  Replacing salts and minerals in your blood (electrolytes) that you may have lost. Treatment for moderate dehydration may include:  Drinking an oral rehydration solution (ORS). This is a drink that helps you replace fluids and electrolytes (rehydrate). It can be found at pharmacies and retail stores. Treatment for severe dehydration may include:  Receiving fluids through an IV tube.  Receiving an electrolyte solution through a feeding tube that is passed through your nose   and into your stomach (nasogastric tube, or NG tube).  Correcting any abnormalities in electrolytes.  Treating the underlying cause of dehydration. Follow these instructions at home:  If directed by your health care provider, drink an ORS: ? Make an ORS by following instructions on the  package. ? Start by drinking small amounts, about  cup (120 mL) every 5-10 minutes. ? Slowly increase how much you drink until you have taken the amount recommended by your health care provider.  Drink enough clear fluid to keep your urine clear or pale yellow. If you were told to drink an ORS, finish the ORS first, then start slowly drinking other clear fluids. Drink fluids such as: ? Water. Do not drink only water. Doing that can lead to having too little salt (sodium) in the body (hyponatremia). ? Ice chips. ? Fruit juice that you have added water to (diluted fruit juice). ? Low-calorie sports drinks.  Avoid: ? Alcohol. ? Drinks that contain a lot of sugar. These include high-calorie sports drinks, fruit juice that is not diluted, and soda. ? Caffeine. ? Foods that are greasy or contain a lot of fat or sugar.  Take over-the-counter and prescription medicines only as told by your health care provider.  Do not take sodium tablets. This can lead to having too much sodium in the body (hypernatremia).  Eat foods that contain a healthy balance of electrolytes, such as bananas, oranges, potatoes, tomatoes, and spinach.  Keep all follow-up visits as told by your health care provider. This is important. Contact a health care provider if:  You have abdominal pain that: ? Gets worse. ? Stays in one area (localizes).  You have a rash.  You have a stiff neck.  You are more irritable than usual.  You are sleepier or more difficult to wake up than usual.  You feel weak or dizzy.  You feel very thirsty.  You have urinated only a small amount of very dark urine over 6-8 hours. Get help right away if:  You have symptoms of severe dehydration.  You cannot drink fluids without vomiting.  Your symptoms get worse with treatment.  You have a fever.  You have a severe headache.  You have vomiting or diarrhea that: ? Gets worse. ? Does not go away.  You have blood or green matter  (bile) in your vomit.  You have blood in your stool. This may cause stool to look black and tarry.  You have not urinated in 6-8 hours.  You faint.  Your heart rate while sitting still is over 100 beats a minute.  You have trouble breathing. This information is not intended to replace advice given to you by your health care provider. Make sure you discuss any questions you have with your health care provider. Document Released: 04/07/2005 Document Revised: 11/02/2015 Document Reviewed: 06/01/2015 Elsevier Interactive Patient Education  2018 [employer]  

## 2017-02-06 ENCOUNTER — Telehealth: Payer: Self-pay | Admitting: Hematology and Oncology

## 2017-02-06 ENCOUNTER — Ambulatory Visit (HOSPITAL_BASED_OUTPATIENT_CLINIC_OR_DEPARTMENT_OTHER): Payer: Medicare Other | Admitting: Hematology and Oncology

## 2017-02-06 ENCOUNTER — Ambulatory Visit: Payer: Medicare Other

## 2017-02-06 ENCOUNTER — Other Ambulatory Visit: Payer: Self-pay | Admitting: Hematology and Oncology

## 2017-02-06 ENCOUNTER — Other Ambulatory Visit (HOSPITAL_BASED_OUTPATIENT_CLINIC_OR_DEPARTMENT_OTHER): Payer: Medicare Other

## 2017-02-06 ENCOUNTER — Ambulatory Visit: Payer: Medicare Other | Admitting: Nutrition

## 2017-02-06 ENCOUNTER — Ambulatory Visit (HOSPITAL_BASED_OUTPATIENT_CLINIC_OR_DEPARTMENT_OTHER): Payer: Medicare Other

## 2017-02-06 VITALS — BP 104/52 | HR 86 | Temp 99.5°F | Resp 20

## 2017-02-06 DIAGNOSIS — E876 Hypokalemia: Secondary | ICD-10-CM

## 2017-02-06 DIAGNOSIS — C8331 Diffuse large B-cell lymphoma, lymph nodes of head, face, and neck: Secondary | ICD-10-CM

## 2017-02-06 DIAGNOSIS — C50411 Malignant neoplasm of upper-outer quadrant of right female breast: Secondary | ICD-10-CM

## 2017-02-06 DIAGNOSIS — Z5112 Encounter for antineoplastic immunotherapy: Secondary | ICD-10-CM

## 2017-02-06 DIAGNOSIS — Z170 Estrogen receptor positive status [ER+]: Secondary | ICD-10-CM

## 2017-02-06 DIAGNOSIS — Z5111 Encounter for antineoplastic chemotherapy: Secondary | ICD-10-CM

## 2017-02-06 DIAGNOSIS — T80212S Local infection due to central venous catheter, sequela: Secondary | ICD-10-CM

## 2017-02-06 DIAGNOSIS — Z95828 Presence of other vascular implants and grafts: Secondary | ICD-10-CM

## 2017-02-06 LAB — CBC WITH DIFFERENTIAL/PLATELET
BASO%: 0.9 % (ref 0.0–2.0)
Basophils Absolute: 0.1 10*3/uL (ref 0.0–0.1)
EOS ABS: 0 10*3/uL (ref 0.0–0.5)
EOS%: 0.2 % (ref 0.0–7.0)
HCT: 33.6 % — ABNORMAL LOW (ref 34.8–46.6)
HGB: 11.1 g/dL — ABNORMAL LOW (ref 11.6–15.9)
LYMPH%: 8.6 % — AB (ref 14.0–49.7)
MCH: 28.4 pg (ref 25.1–34.0)
MCHC: 33 g/dL (ref 31.5–36.0)
MCV: 85.9 fL (ref 79.5–101.0)
MONO#: 1.1 10*3/uL — ABNORMAL HIGH (ref 0.1–0.9)
MONO%: 9.3 % (ref 0.0–14.0)
NEUT#: 9.8 10*3/uL — ABNORMAL HIGH (ref 1.5–6.5)
NEUT%: 81 % — AB (ref 38.4–76.8)
PLATELETS: 468 10*3/uL — AB (ref 145–400)
RBC: 3.91 10*6/uL (ref 3.70–5.45)
RDW: 14.9 % — ABNORMAL HIGH (ref 11.2–14.5)
WBC: 12.1 10*3/uL — ABNORMAL HIGH (ref 3.9–10.3)
lymph#: 1 10*3/uL (ref 0.9–3.3)

## 2017-02-06 LAB — COMPREHENSIVE METABOLIC PANEL
ALT: 263 U/L — ABNORMAL HIGH (ref 0–55)
ANION GAP: 12 meq/L — AB (ref 3–11)
AST: 180 U/L (ref 5–34)
Albumin: 2.8 g/dL — ABNORMAL LOW (ref 3.5–5.0)
Alkaline Phosphatase: 47 U/L (ref 40–150)
BILIRUBIN TOTAL: 0.55 mg/dL (ref 0.20–1.20)
BUN: 16.8 mg/dL (ref 7.0–26.0)
CHLORIDE: 101 meq/L (ref 98–109)
CO2: 24 meq/L (ref 22–29)
Calcium: 9.3 mg/dL (ref 8.4–10.4)
Creatinine: 1.4 mg/dL — ABNORMAL HIGH (ref 0.6–1.1)
EGFR: 37 mL/min/{1.73_m2} — AB (ref >60)
Glucose: 144 mg/dl — ABNORMAL HIGH (ref 70–140)
POTASSIUM: 3.5 meq/L (ref 3.5–5.1)
Sodium: 137 mEq/L (ref 136–145)
Total Protein: 7 g/dL (ref 6.4–8.3)

## 2017-02-06 MED ORDER — VINCRISTINE SULFATE CHEMO INJECTION 1 MG/ML
2.0000 mg | Freq: Once | INTRAVENOUS | Status: AC
  Administered 2017-02-06: 2 mg via INTRAVENOUS
  Filled 2017-02-06: qty 2

## 2017-02-06 MED ORDER — SODIUM CHLORIDE 0.9 % IV SOLN
Freq: Once | INTRAVENOUS | Status: AC
  Administered 2017-02-06: 10:00:00 via INTRAVENOUS

## 2017-02-06 MED ORDER — DIPHENHYDRAMINE HCL 12.5 MG/5ML PO ELIX
50.0000 mg | ORAL_SOLUTION | Freq: Once | ORAL | Status: AC
  Administered 2017-02-06: 50 mg via ORAL
  Filled 2017-02-06: qty 20

## 2017-02-06 MED ORDER — SODIUM CHLORIDE 0.9 % IV SOLN
375.0000 mg/m2 | Freq: Once | INTRAVENOUS | Status: AC
  Administered 2017-02-06: 800 mg via INTRAVENOUS
  Filled 2017-02-06: qty 50

## 2017-02-06 MED ORDER — PALONOSETRON HCL INJECTION 0.25 MG/5ML
0.2500 mg | Freq: Once | INTRAVENOUS | Status: AC
  Administered 2017-02-06: 0.25 mg via INTRAVENOUS

## 2017-02-06 MED ORDER — DEXAMETHASONE SODIUM PHOSPHATE 10 MG/ML IJ SOLN
10.0000 mg | Freq: Once | INTRAMUSCULAR | Status: AC
Start: 2017-02-06 — End: 2017-02-06
  Administered 2017-02-06: 10 mg via INTRAVENOUS

## 2017-02-06 MED ORDER — SODIUM CHLORIDE 0.9% FLUSH
10.0000 mL | INTRAVENOUS | Status: DC | PRN
  Administered 2017-02-06: 10 mL
  Filled 2017-02-06: qty 10

## 2017-02-06 MED ORDER — SODIUM CHLORIDE 0.9 % IV SOLN: 375.0000 mg/m2 | Freq: Once | INTRAVENOUS | Status: DC

## 2017-02-06 MED ORDER — HEPARIN SOD (PORK) LOCK FLUSH 100 UNIT/ML IV SOLN
500.0000 [IU] | Freq: Once | INTRAVENOUS | Status: AC | PRN
  Administered 2017-02-06: 500 [IU]
  Filled 2017-02-06: qty 5

## 2017-02-06 MED ORDER — DOXORUBICIN HCL CHEMO IV INJECTION 2 MG/ML
30.0000 mg/m2 | Freq: Once | INTRAVENOUS | Status: AC
  Administered 2017-02-06: 66 mg via INTRAVENOUS
  Filled 2017-02-06: qty 33

## 2017-02-06 MED ORDER — SODIUM CHLORIDE 0.9% FLUSH
10.0000 mL | Freq: Once | INTRAVENOUS | Status: AC
  Administered 2017-02-06: 10 mL
  Filled 2017-02-06: qty 10

## 2017-02-06 MED ORDER — ACETAMINOPHEN 160 MG/5ML PO SOLN
650.0000 mg | Freq: Once | ORAL | Status: AC
  Administered 2017-02-06: 650 mg via ORAL
  Filled 2017-02-06: qty 20.3

## 2017-02-06 MED ORDER — ACETAMINOPHEN 325 MG PO TABS: 650.0000 mg | ORAL_TABLET | Freq: Once | ORAL | Status: DC

## 2017-02-06 MED ORDER — SODIUM CHLORIDE 0.9 % IV SOLN
600.0000 mg/m2 | Freq: Once | INTRAVENOUS | Status: AC
  Administered 2017-02-06: 1300 mg via INTRAVENOUS
  Filled 2017-02-06: qty 65

## 2017-02-06 MED ORDER — DIPHENHYDRAMINE HCL 25 MG PO CAPS: 50.0000 mg | ORAL_CAPSULE | Freq: Once | ORAL | Status: DC

## 2017-02-06 MED ORDER — ACETAMINOPHEN 325 MG PO TABS
ORAL_TABLET | ORAL | Status: AC
  Filled 2017-02-06: qty 2

## 2017-02-06 MED ORDER — DEXAMETHASONE SODIUM PHOSPHATE 10 MG/ML IJ SOLN
INTRAMUSCULAR | Status: AC
  Filled 2017-02-06: qty 1

## 2017-02-06 MED ORDER — PALONOSETRON HCL INJECTION 0.25 MG/5ML
INTRAVENOUS | Status: AC
  Filled 2017-02-06: qty 5

## 2017-02-06 MED ORDER — PEGFILGRASTIM 6 MG/0.6ML ~~LOC~~ PSKT
6.0000 mg | PREFILLED_SYRINGE | Freq: Once | SUBCUTANEOUS | Status: AC
  Administered 2017-02-06: 6 mg via SUBCUTANEOUS
  Filled 2017-02-06: qty 0.6

## 2017-02-06 MED ORDER — DIPHENHYDRAMINE HCL 25 MG PO CAPS
ORAL_CAPSULE | ORAL | Status: AC
  Filled 2017-02-06: qty 2

## 2017-02-06 NOTE — Telephone Encounter (Signed)
Gave patient avs and calendar with appts per 10/19 los °

## 2017-02-06 NOTE — Progress Notes (Signed)
Patient Care Team: Marletta Lor, MD as PCP - General Marcy Panning, MD as Consulting Physician (Internal Medicine) Thea Silversmith, MD (Inactive) as Consulting Physician (Radiation Oncology)  DIAGNOSIS:  Encounter Diagnosis  Name Primary?  . 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 Radiation recall related to tamoxifen      11/25/2010 -  Anti-estrogen oral therapy    Aromasin 25 mg daily.myalgias and arthralgias stop November 2012 went back to tamoxifen 20 mg daily      01/06/2017 Pathology Results    Bone marrow biopsy: Slightly hypercellular bone marrow for age with granulocytic hyperplasia, numerous small lymphoid aggregates present. This may represent minimal involvement by low-grade B-cell lymphoma or liver process like SLL/CLL because they are CD5 positive       Diffuse large B cell lymphoma ([place])   12/26/2016 Initial Diagnosis    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 positive for LCA, CD20, CD79a PAX-5, BCL-6 and weak cytoplasmic kappa associated with patchy weak positivity for BCL-2 and CD10, Ki-67 40%      01/16/2017 -  Chemotherapy    R CHOP 6 cycles       CHIEF COMPLIANT: cycle 2R CHOP  INTERVAL HISTORY: Kristen Escobar is a 71 year old with above-mentioned history of paraesophageal diffuse large B-cell lymphoma who is here for cycle 2 of chemotherapy with R CHOP. Over  the past 2 weeks she has felt much better. Previous to this she had episodes of coughing spells and also had hypokalemia and required IV potassium replacement therapy. She still continues to have some difficulty swallowing liquids. Her food intake has improved.  REVIEW OF SYSTEMS:   Constitutional: Denies fevers, chills or abnormal weight loss Eyes: Denies blurriness of vision Ears, nose, mouth, throat, and face: swallowing difficulties, hoarseness of voice Respiratory: Denies cough, dyspnea or wheezes Cardiovascular: Denies palpitation, chest discomfort Gastrointestinal:  Denies nausea, heartburn or change in bowel habits Skin: Denies abnormal skin rashes Lymphatics: Denies new lymphadenopathy or easy bruising Neurological:Denies numbness, tingling or new weaknesses Behavioral/Psych: Mood is stable, no new changes  Extremities: No lower extremity edema  All other systems were reviewed with the patient and are negative.  I have reviewed the past medical history, past surgical history, social history and family history with the patient and they are unchanged from previous note.  ALLERGIES:  has No Known Allergies.  MEDICATIONS:  Current Outpatient Prescriptions  Medication Sig Dispense Refill  . allopurinol (ZYLOPRIM) 300 MG tablet Take 1 tablet (300 mg total) by mouth daily. 30 tablet 3  . atorvastatin (LIPITOR) 10 MG tablet TAKE 1 TABLET EVERY DAY 90 tablet 3  . chlorpheniramine-HYDROcodone (TUSSIONEX) 10-8 MG/5ML SUER Take 5 mLs by mouth every 12 (twelve) hours as needed for cough. 120 mL 0  . HYDROcodone-acetaminophen (NORCO) 7.5-325 MG tablet Take 1 tablet by mouth every 6 (six) hours as needed for moderate pain. 20 tablet 0  . levothyroxine (  SYNTHROID, LEVOTHROID) 50 MCG tablet TAKE 1 TABLET BY MOUTH DAILY 90 tablet 1  . lidocaine (XYLOCAINE) 2 % solution Use as directed 20 mLs in the mouth or throat every 3 (three) hours as needed for mouth pain. 100 mL 3  . lidocaine-prilocaine  (EMLA) cream Apply to affected area once 30 g 3  . LORazepam (ATIVAN) 0.5 MG tablet Take 1 tablet (0.5 mg total) by mouth every 6 (six) hours as needed (Nausea or vomiting). 30 tablet 0  . losartan-hydrochlorothiazide (HYZAAR) 100-25 MG tablet TAKE 1 TABLET BY MOUTH EVERY DAY 90 tablet 1  . magic mouthwash w/lidocaine SOLN Take 5 mLs by mouth 3 (three) times daily as needed for mouth pain. 100 mL 1  . omeprazole (PRILOSEC) 20 MG capsule TAKE 1 CAPSULE BY MOUTH EVERY DAY 30 capsule 3  . ondansetron (ZOFRAN) 8 MG tablet Take 1 tablet (8 mg total) by mouth 2 (two) times daily as needed for refractory nausea / vomiting. Start on day 3 after cyclophosphamide chemotherapy. 30 tablet 1  . predniSONE (DELTASONE) 20 MG tablet Take 3 tablets (60 mg total) by mouth daily. Take on days 1-5 of chemotherapy. 90 tablet 0  . prochlorperazine (COMPAZINE) 10 MG tablet Take 1 tablet (10 mg total) by mouth every 6 (six) hours as needed (Nausea or vomiting). (Patient not taking: Reported on 01/20/2017) 30 tablet 6  . promethazine (PHENERGAN) 25 MG suppository Place 1 suppository (25 mg total) rectally every 6 (six) hours as needed for nausea or vomiting. (Patient not taking: Reported on 01/20/2017) 12 suppository 1  . sucralfate (CARAFATE) 1 GM/10ML suspension Take 10 mLs (1 g total) by mouth 4 (four) times daily -  with meals and at bedtime. 420 mL 3   No current facility-administered medications for this visit.    Facility-Administered Medications Ordered in Other Visits  Medication Dose Route Frequency Provider Last Rate Last Dose  . sodium chloride 0.9 % 500 mL with potassium chloride 30 mEq infusion   Intravenous Once Harle Stanford., PA-C        PHYSICAL EXAMINATION: ECOG PERFORMANCE STATUS: 1 - Symptomatic but completely ambulatory  Vitals:   02/06/17 0835  BP: (!) 147/61  Pulse: 90  Resp: 20  Temp: 98.9 F (37.2 C)  SpO2: 96%   Filed Weights   02/06/17 0835  Weight: 211 lb 6.4 oz (95.9 kg)     GENERAL:alert, no distress and comfortable SKIN: skin color, texture, turgor are normal, no rashes or significant lesions EYES: normal, Conjunctiva are pink and non-injected, sclera clear OROPHARYNX:no exudate, no erythema and lips, buccal mucosa, and tongue normal  NECK: supple, thyroid normal size, non-tender, without nodularity LYMPH:  no palpable lymphadenopathy in the cervical, axillary or inguinal LUNGS: clear to auscultation and percussion with normal breathing effort HEART: regular rate & rhythm and no murmurs and no lower extremity edema ABDOMEN:abdomen soft, non-tender and normal bowel sounds MUSCULOSKELETAL:no cyanosis of digits and no clubbing  NEURO: alert & oriented x 3 with fluent speech, no focal motor/sensory deficits EXTREMITIES: No lower extremity edema  LABORATORY DATA:  I have reviewed the data as listed   Chemistry      Component Value Date/Time   NA 140 01/30/2017 0914   K 2.6 (LL) 01/30/2017 0914   CL 101 12/24/2016 1133   CL 105 03/10/2012 0859   CO2 26 01/30/2017 0914   BUN 17.2 01/30/2017 0914   CREATININE 1.6 (H) 01/30/2017 0914      Component Value Date/Time   CALCIUM  8.8 01/30/2017 0914   ALKPHOS 45 01/30/2017 0914   AST 63 (H) 01/30/2017 0914   ALT 100 (H) 01/30/2017 0914   BILITOT 0.59 01/30/2017 0914       Lab Results  Component Value Date   WBC 12.1 (H) 02/06/2017   HGB 11.1 (L) 02/06/2017   HCT 33.6 (L) 02/06/2017   MCV 85.9 02/06/2017   PLT 468 (H) 02/06/2017   NEUTROABS 9.8 (H) 02/06/2017    ASSESSMENT & PLAN:  Breast cancer of upper-outer quadrant of right female breast Diffuse large B cell lymphoma ([facility]) 12/26/2016 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 positive for LCA, CD20, CD79a PAX-5, BCL-6 and weak cytoplasmic kappa associated with patchy weak positivity for BCL-2 and CD10, Ki-67 40%  Bone marrow biopsy negative for diffuse large B cell lymphoma but suggestive of small low-grade B  cell population possibly SLL/CLL Revised IPI score: 1, good prognosis, redictated for years PFS 80%, predicted overall survival 79% ------------------------------------------------------------------------ Current treatment: R CHOP 6 cycles today is cycle 2 day 1 Chemotherapy toxicities: 1. Hypokalemia: Giving potassium replacement therapy by IV 2. pharyngoesophageal dysphagia 3. Difficulty with swallowing liquids: Patient's profound gag reflex.  4. Clinical dehydration:given IV fluids  Return to clinic in 3 weeks for cycle 3 patient will come weekly for IV fluid hydration  I spent 25 minutes talking to the patient of which more than half was spent in counseling and coordination of care.  No orders of the defined types were placed in this encounter.  The patient has a good understanding of the overall plan. she agrees with it. she will call with any problems that may develop before the next visit here.   Rulon Eisenmenger, MD 02/06/17

## 2017-02-06 NOTE — Progress Notes (Signed)
With elevation of AST and ALT, will reduce the dosage of today's chemotherapy.

## 2017-02-06 NOTE — Patient Instructions (Signed)
[facility] Discharge Instructions for Patients Receiving Chemotherapy  Today you received the following chemotherapy agents: Adriamycin, Vincristine, Cytoxan and Rituxan.  To help prevent nausea and vomiting after your treatment, we encourage you to take your nausea medication: Compazine. Take one every 6 hours as needed. For nausea on or after 02/09/17, you may take Zofran every 8 hours.  If you develop nausea and vomiting that is not controlled by your nausea medication, call the clinic.   BELOW ARE SYMPTOMS THAT SHOULD BE REPORTED IMMEDIATELY:  *FEVER GREATER THAN 100.5 F  *CHILLS WITH OR WITHOUT FEVER  NAUSEA AND VOMITING THAT IS NOT CONTROLLED WITH YOUR NAUSEA MEDICATION  *UNUSUAL SHORTNESS OF BREATH  *UNUSUAL BRUISING OR BLEEDING  TENDERNESS IN MOUTH AND THROAT WITH OR WITHOUT PRESENCE OF ULCERS  *URINARY PROBLEMS  *BOWEL PROBLEMS  UNUSUAL RASH Items with * indicate a potential emergency and should be followed up as soon as possible.  Feel free to call the clinic should you have any questions or concerns. The clinic phone number is [phone].  Please show the [facility] at check-in to the Emergency Department and triage nurse.

## 2017-02-06 NOTE — Progress Notes (Signed)
Patient was identified to be at risk for malnutrition on the MST secondary to poor appetite and weight loss.  71 year old female diagnosed with B-cell lymphoma.  She is a patient of Dr. Lindi Adie.  Past medical history includes thyroid mass, PVD, obesity, hypertension, and GERD.  Medications include Ativan, Magic mouthwash, Prilosec, Zofran, prednisone, Compazine, Phenergan, and Carafate.  Labs include glucose 144, creatinine 1.4, albumin 2.8.  Height: 63 inches. Weight: 211.4 pounds. Usual body weight: 237 pounds in April. BMI: 37.45.  Patient reports she has had difficulty swallowing over the past month. She denies nausea, vomiting and diarrhea. She has just recently been able to drink ensure and tolerate some soft foods and liquids like yogurt, soup, cold cereal and fruit. She reports it takes her all day to drink Ensure Plus. Patient has difficulty swallowing water. Noted patient coughing after sipping on fluids. Nutrition focused exam deferred.  Estimated nutrition needs: 2000-2200 cal, 110-120 grams protein, 2.2 L fluid.  Nutrition diagnosis: Unintended weight loss related to difficulty swallowing as evidenced by patient's self-report of decreased oral intake.  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  Intervention: Educated patient to try to increase Ensure Plus twice a day Reviewed soft protein foods with patient and encouraged her to continue to try different textures. Consider swallowing evaluation by speech therapist. Questions were answered.  Teach back method used.  Monitoring, evaluation, goals: Patient will tolerate increased calories and protein to minimize further weight loss.  Next visit: Friday, November 30 during infusion.  **Disclaimer: This note was dictated with voice recognition software. Similar sounding words can inadvertently  be transcribed and this note may contain transcription errors which may not have been corrected upon publication of note.**

## 2017-02-06 NOTE — Progress Notes (Unsigned)
CMET reviewed with Dr. Lindi Adie: AST 180, ALT 253. OK to treat with chemo dose reduction. Pt and husband informed, agree with plan.  Pt stated she is unable to swallow pills. No pill crusher was available. She has significant coughing with swallowing liquids. Chin tuck with swallowing was ineffective. Pt had considerably less coughing L head turn before swallowing.

## 2017-02-11 ENCOUNTER — Other Ambulatory Visit: Payer: Self-pay | Admitting: Medical

## 2017-02-11 ENCOUNTER — Ambulatory Visit (HOSPITAL_BASED_OUTPATIENT_CLINIC_OR_DEPARTMENT_OTHER): Payer: Medicare Other

## 2017-02-11 ENCOUNTER — Telehealth: Payer: Self-pay | Admitting: Hematology and Oncology

## 2017-02-11 ENCOUNTER — Other Ambulatory Visit: Payer: Self-pay

## 2017-02-11 VITALS — BP 155/53 | HR 87 | Temp 98.1°F | Resp 20 | Ht 63.0 in | Wt 209.9 lb

## 2017-02-11 DIAGNOSIS — Z170 Estrogen receptor positive status [ER+]: Secondary | ICD-10-CM

## 2017-02-11 DIAGNOSIS — E876 Hypokalemia: Secondary | ICD-10-CM | POA: Diagnosis not present

## 2017-02-11 DIAGNOSIS — C50411 Malignant neoplasm of upper-outer quadrant of right female breast: Secondary | ICD-10-CM

## 2017-02-11 DIAGNOSIS — C8331 Diffuse large B-cell lymphoma, lymph nodes of head, face, and neck: Secondary | ICD-10-CM

## 2017-02-11 DIAGNOSIS — T80212S Local infection due to central venous catheter, sequela: Secondary | ICD-10-CM

## 2017-02-11 DIAGNOSIS — Z95828 Presence of other vascular implants and grafts: Secondary | ICD-10-CM

## 2017-02-11 LAB — CBC WITH DIFFERENTIAL/PLATELET
BASO%: 0.8 % (ref 0.0–2.0)
Basophils Absolute: 0.1 10*3/uL (ref 0.0–0.1)
EOS%: 1 % (ref 0.0–7.0)
Eosinophils Absolute: 0.1 10*3/uL (ref 0.0–0.5)
HCT: 30.9 % — ABNORMAL LOW (ref 34.8–46.6)
HGB: 10.1 g/dL — ABNORMAL LOW (ref 11.6–15.9)
LYMPH%: 7.4 % — AB (ref 14.0–49.7)
MCH: 28.3 pg (ref 25.1–34.0)
MCHC: 32.7 g/dL (ref 31.5–36.0)
MCV: 86.4 fL (ref 79.5–101.0)
MONO#: 0.1 10*3/uL (ref 0.1–0.9)
MONO%: 1 % (ref 0.0–14.0)
NEUT#: 6 10*3/uL (ref 1.5–6.5)
NEUT%: 89.8 % — AB (ref 38.4–76.8)
PLATELETS: 104 10*3/uL — AB (ref 145–400)
RBC: 3.58 10*6/uL — AB (ref 3.70–5.45)
RDW: 15.5 % — ABNORMAL HIGH (ref 11.2–14.5)
WBC: 6.7 10*3/uL (ref 3.9–10.3)
lymph#: 0.5 10*3/uL — ABNORMAL LOW (ref 0.9–3.3)

## 2017-02-11 LAB — COMPREHENSIVE METABOLIC PANEL
ALBUMIN: 2.6 g/dL — AB (ref 3.5–5.0)
ALK PHOS: 70 U/L (ref 40–150)
ALT: 449 U/L (ref 0–55)
AST: 84 U/L — AB (ref 5–34)
Anion Gap: 10 mEq/L (ref 3–11)
BILIRUBIN TOTAL: 0.65 mg/dL (ref 0.20–1.20)
BUN: 34.1 mg/dL — AB (ref 7.0–26.0)
CALCIUM: 8.5 mg/dL (ref 8.4–10.4)
CO2: 23 mEq/L (ref 22–29)
CREATININE: 1.1 mg/dL (ref 0.6–1.1)
Chloride: 106 mEq/L (ref 98–109)
EGFR: 50 mL/min/{1.73_m2} — ABNORMAL LOW (ref >60)
GLUCOSE: 162 mg/dL — AB (ref 70–140)
Potassium: 3.8 mEq/L (ref 3.5–5.1)
SODIUM: 138 meq/L (ref 136–145)
TOTAL PROTEIN: 5.9 g/dL — AB (ref 6.4–8.3)

## 2017-02-11 MED ORDER — HYDROCOD POLST-CPM POLST ER 10-8 MG/5ML PO SUER: 5.0000 mL | Freq: Two times a day (BID) | ORAL | 0 refills | Status: DC | PRN

## 2017-02-11 MED ORDER — SODIUM CHLORIDE 0.9 % IV SOLN
Freq: Once | INTRAVENOUS | Status: AC
  Administered 2017-02-11: 10:00:00 via INTRAVENOUS

## 2017-02-11 MED ORDER — HEPARIN SOD (PORK) LOCK FLUSH 100 UNIT/ML IV SOLN
500.0000 [IU] | Freq: Once | INTRAVENOUS | Status: AC
  Administered 2017-02-11: 500 [IU]
  Filled 2017-02-11: qty 5

## 2017-02-11 MED ORDER — SODIUM CHLORIDE 0.9% FLUSH
10.0000 mL | Freq: Once | INTRAVENOUS | Status: AC
  Administered 2017-02-11: 10 mL
  Filled 2017-02-11: qty 10

## 2017-02-11 NOTE — Patient Instructions (Signed)
Dehydration, Adult Dehydration is a condition in which there is not enough fluid or water in the body. This happens when you lose more fluids than you take in. Important organs, such as the kidneys, brain, and heart, cannot function without a proper amount of fluids. Any loss of fluids from the body can lead to dehydration. Dehydration can range from mild to severe. This condition should be treated right away to prevent it from becoming severe. What are the causes? This condition may be caused by:  Vomiting.  Diarrhea.  Excessive sweating, such as from heat exposure or exercise.  Not drinking enough fluid, especially: ? When ill. ? While doing activity that requires a lot of energy.  Excessive urination.  Fever.  Infection.  Certain medicines, such as medicines that cause the body to lose excess fluid (diuretics).  Inability to access safe drinking water.  Reduced physical ability to get adequate water and food.  What increases the risk? This condition is more likely to develop in people:  Who have a poorly controlled long-term (chronic) illness, such as diabetes, heart disease, or kidney disease.  Who are age 65 or older.  Who are disabled.  Who live in a place with high altitude.  Who play endurance sports.  What are the signs or symptoms? Symptoms of mild dehydration may include:  Thirst.  Dry lips.  Slightly dry mouth.  Dry, warm skin.  Dizziness. Symptoms of moderate dehydration may include:  Very dry mouth.  Muscle cramps.  Dark urine. Urine may be the color of tea.  Decreased urine production.  Decreased tear production.  Heartbeat that is irregular or faster than normal (palpitations).  Headache.  Light-headedness, especially when you stand up from a sitting position.  Fainting (syncope). Symptoms of severe dehydration may include:  Changes in skin, such as: ? Cold and clammy skin. ? Blotchy (mottled) or pale skin. ? Skin that does  not quickly return to normal after being lightly pinched and released (poor skin turgor).  Changes in body fluids, such as: ? Extreme thirst. ? No tear production. ? Inability to sweat when body temperature is high, such as in hot weather. ? Very little urine production.  Changes in vital signs, such as: ? Weak pulse. ? Pulse that is more than 100 beats a minute when sitting still. ? Rapid breathing. ? Low blood pressure.  Other changes, such as: ? Sunken eyes. ? Cold hands and feet. ? Confusion. ? Lack of energy (lethargy). ? Difficulty waking up from sleep. ? Short-term weight loss. ? Unconsciousness. How is this diagnosed? This condition is diagnosed based on your symptoms and a physical exam. Blood and urine tests may be done to help confirm the diagnosis. How is this treated? Treatment for this condition depends on the severity. Mild or moderate dehydration can often be treated at home. Treatment should be started right away. Do not wait until dehydration becomes severe. Severe dehydration is an emergency and it needs to be treated in a hospital. Treatment for mild dehydration may include:  Drinking more fluids.  Replacing salts and minerals in your blood (electrolytes) that you may have lost. Treatment for moderate dehydration may include:  Drinking an oral rehydration solution (ORS). This is a drink that helps you replace fluids and electrolytes (rehydrate). It can be found at pharmacies and retail stores. Treatment for severe dehydration may include:  Receiving fluids through an IV tube.  Receiving an electrolyte solution through a feeding tube that is passed through your nose   and into your stomach (nasogastric tube, or NG tube).  Correcting any abnormalities in electrolytes.  Treating the underlying cause of dehydration. Follow these instructions at home:  If directed by your health care provider, drink an ORS: ? Make an ORS by following instructions on the  package. ? Start by drinking small amounts, about  cup (120 mL) every 5-10 minutes. ? Slowly increase how much you drink until you have taken the amount recommended by your health care provider.  Drink enough clear fluid to keep your urine clear or pale yellow. If you were told to drink an ORS, finish the ORS first, then start slowly drinking other clear fluids. Drink fluids such as: ? Water. Do not drink only water. Doing that can lead to having too little salt (sodium) in the body (hyponatremia). ? Ice chips. ? Fruit juice that you have added water to (diluted fruit juice). ? Low-calorie sports drinks.  Avoid: ? Alcohol. ? Drinks that contain a lot of sugar. These include high-calorie sports drinks, fruit juice that is not diluted, and soda. ? Caffeine. ? Foods that are greasy or contain a lot of fat or sugar.  Take over-the-counter and prescription medicines only as told by your health care provider.  Do not take sodium tablets. This can lead to having too much sodium in the body (hypernatremia).  Eat foods that contain a healthy balance of electrolytes, such as bananas, oranges, potatoes, tomatoes, and spinach.  Keep all follow-up visits as told by your health care provider. This is important. Contact a health care provider if:  You have abdominal pain that: ? Gets worse. ? Stays in one area (localizes).  You have a rash.  You have a stiff neck.  You are more irritable than usual.  You are sleepier or more difficult to wake up than usual.  You feel weak or dizzy.  You feel very thirsty.  You have urinated only a small amount of very dark urine over 6-8 hours. Get help right away if:  You have symptoms of severe dehydration.  You cannot drink fluids without vomiting.  Your symptoms get worse with treatment.  You have a fever.  You have a severe headache.  You have vomiting or diarrhea that: ? Gets worse. ? Does not go away.  You have blood or green matter  (bile) in your vomit.  You have blood in your stool. This may cause stool to look black and tarry.  You have not urinated in 6-8 hours.  You faint.  Your heart rate while sitting still is over 100 beats a minute.  You have trouble breathing. This information is not intended to replace advice given to you by your health care provider. Make sure you discuss any questions you have with your health care provider. Document Released: 04/07/2005 Document Revised: 11/02/2015 Document Reviewed: 06/01/2015 Elsevier Interactive Patient Education  2018 [employer]  

## 2017-02-11 NOTE — Progress Notes (Signed)
Received orders from Dr. Lindi Adie to obtain labs today. CBC and CMET obtained from port Dr, Lindi Adie made aware of elevated LFT's

## 2017-02-11 NOTE — Telephone Encounter (Signed)
Scheduled appt per 10/24 sch  Message - patient is aware of appt time and date.

## 2017-02-16 ENCOUNTER — Other Ambulatory Visit: Payer: Self-pay | Admitting: Medical

## 2017-02-16 ENCOUNTER — Other Ambulatory Visit: Payer: Self-pay | Admitting: *Deleted

## 2017-02-16 ENCOUNTER — Inpatient Hospital Stay (HOSPITAL_COMMUNITY)
Admission: RE | Admit: 2017-02-16 | Discharge: 2017-02-16 | Disposition: A | Discharge status: Home / Self Care | Payer: Medicare Other | Source: Ambulatory Visit | Attending: Medical | Admitting: Medical

## 2017-02-16 ENCOUNTER — Other Ambulatory Visit (HOSPITAL_COMMUNITY)
Admission: RE | Admit: 2017-02-16 | Discharge: 2017-02-16 | Disposition: A | Discharge status: Home / Self Care | Payer: Medicare Other | Source: Ambulatory Visit | Attending: Medical | Admitting: Medical

## 2017-02-16 ENCOUNTER — Inpatient Hospital Stay (HOSPITAL_COMMUNITY)
Admission: AD | Admit: 2017-02-16 | Discharge: 2017-02-23 | DRG: 177 | Disposition: A | Discharge status: Home Health | Payer: Medicare Other | Source: Ambulatory Visit | Attending: Family Medicine | Admitting: Family Medicine

## 2017-02-16 ENCOUNTER — Other Ambulatory Visit (HOSPITAL_BASED_OUTPATIENT_CLINIC_OR_DEPARTMENT_OTHER): Payer: Medicare Other

## 2017-02-16 ENCOUNTER — Encounter (HOSPITAL_COMMUNITY): Payer: Self-pay | Admitting: *Deleted

## 2017-02-16 ENCOUNTER — Ambulatory Visit (HOSPITAL_COMMUNITY)
Admission: RE | Admit: 2017-02-16 | Discharge: 2017-02-16 | Disposition: A | Discharge status: Home / Self Care | Payer: Medicare Other | Source: Ambulatory Visit | Attending: Medical | Admitting: Medical

## 2017-02-16 ENCOUNTER — Ambulatory Visit (HOSPITAL_BASED_OUTPATIENT_CLINIC_OR_DEPARTMENT_OTHER): Payer: Medicare Other | Admitting: Medical

## 2017-02-16 ENCOUNTER — Ambulatory Visit: Payer: Medicare Other

## 2017-02-16 ENCOUNTER — Telehealth: Payer: Self-pay | Admitting: *Deleted

## 2017-02-16 VITALS — BP 128/45 | HR 95 | Temp 98.2°F | Resp 16 | Ht 63.0 in | Wt 206.4 lb

## 2017-02-16 DIAGNOSIS — R1310 Dysphagia, unspecified: Secondary | ICD-10-CM | POA: Diagnosis present

## 2017-02-16 DIAGNOSIS — N183 Chronic kidney disease, stage 3 (moderate): Secondary | ICD-10-CM | POA: Diagnosis present

## 2017-02-16 DIAGNOSIS — R0902 Hypoxemia: Secondary | ICD-10-CM

## 2017-02-16 DIAGNOSIS — J9504 Tracheo-esophageal fistula following tracheostomy: Secondary | ICD-10-CM | POA: Diagnosis not present

## 2017-02-16 DIAGNOSIS — I10 Essential (primary) hypertension: Secondary | ICD-10-CM | POA: Diagnosis not present

## 2017-02-16 DIAGNOSIS — N182 Chronic kidney disease, stage 2 (mild): Secondary | ICD-10-CM

## 2017-02-16 DIAGNOSIS — I2699 Other pulmonary embolism without acute cor pulmonale: Secondary | ICD-10-CM

## 2017-02-16 DIAGNOSIS — Z87891 Personal history of nicotine dependence: Secondary | ICD-10-CM

## 2017-02-16 DIAGNOSIS — C8330 Diffuse large B-cell lymphoma, unspecified site: Secondary | ICD-10-CM | POA: Diagnosis present

## 2017-02-16 DIAGNOSIS — R0609 Other forms of dyspnea: Principal | ICD-10-CM

## 2017-02-16 DIAGNOSIS — R0600 Dyspnea, unspecified: Secondary | ICD-10-CM

## 2017-02-16 DIAGNOSIS — Z96643 Presence of artificial hip joint, bilateral: Secondary | ICD-10-CM | POA: Diagnosis present

## 2017-02-16 DIAGNOSIS — R1313 Dysphagia, pharyngeal phase: Secondary | ICD-10-CM | POA: Diagnosis not present

## 2017-02-16 DIAGNOSIS — Z8249 Family history of ischemic heart disease and other diseases of the circulatory system: Secondary | ICD-10-CM | POA: Diagnosis not present

## 2017-02-16 DIAGNOSIS — J9621 Acute and chronic respiratory failure with hypoxia: Secondary | ICD-10-CM | POA: Diagnosis not present

## 2017-02-16 DIAGNOSIS — Z170 Estrogen receptor positive status [ER+]: Principal | ICD-10-CM

## 2017-02-16 DIAGNOSIS — Z89022 Acquired absence of left finger(s): Secondary | ICD-10-CM | POA: Diagnosis not present

## 2017-02-16 DIAGNOSIS — J9601 Acute respiratory failure with hypoxia: Secondary | ICD-10-CM

## 2017-02-16 DIAGNOSIS — Z79899 Other long term (current) drug therapy: Secondary | ICD-10-CM

## 2017-02-16 DIAGNOSIS — R197 Diarrhea, unspecified: Secondary | ICD-10-CM | POA: Diagnosis present

## 2017-02-16 DIAGNOSIS — N179 Acute kidney failure, unspecified: Secondary | ICD-10-CM | POA: Diagnosis not present

## 2017-02-16 DIAGNOSIS — R0602 Shortness of breath: Secondary | ICD-10-CM | POA: Diagnosis not present

## 2017-02-16 DIAGNOSIS — D696 Thrombocytopenia, unspecified: Secondary | ICD-10-CM | POA: Diagnosis present

## 2017-02-16 DIAGNOSIS — Z9221 Personal history of antineoplastic chemotherapy: Secondary | ICD-10-CM

## 2017-02-16 DIAGNOSIS — J860 Pyothorax with fistula: Principal | ICD-10-CM | POA: Diagnosis present

## 2017-02-16 DIAGNOSIS — I129 Hypertensive chronic kidney disease with stage 1 through stage 4 chronic kidney disease, or unspecified chronic kidney disease: Secondary | ICD-10-CM | POA: Diagnosis present

## 2017-02-16 DIAGNOSIS — D638 Anemia in other chronic diseases classified elsewhere: Secondary | ICD-10-CM | POA: Diagnosis present

## 2017-02-16 DIAGNOSIS — C50411 Malignant neoplasm of upper-outer quadrant of right female breast: Secondary | ICD-10-CM | POA: Diagnosis not present

## 2017-02-16 DIAGNOSIS — R05 Cough: Secondary | ICD-10-CM | POA: Diagnosis not present

## 2017-02-16 DIAGNOSIS — J3801 Paralysis of vocal cords and larynx, unilateral: Secondary | ICD-10-CM | POA: Diagnosis not present

## 2017-02-16 DIAGNOSIS — C8331 Diffuse large B-cell lymphoma, lymph nodes of head, face, and neck: Secondary | ICD-10-CM | POA: Diagnosis not present

## 2017-02-16 DIAGNOSIS — Z89021 Acquired absence of right finger(s): Secondary | ICD-10-CM | POA: Diagnosis not present

## 2017-02-16 DIAGNOSIS — Z923 Personal history of irradiation: Secondary | ICD-10-CM

## 2017-02-16 DIAGNOSIS — R7989 Other specified abnormal findings of blood chemistry: Secondary | ICD-10-CM | POA: Diagnosis not present

## 2017-02-16 DIAGNOSIS — Z5111 Encounter for antineoplastic chemotherapy: Secondary | ICD-10-CM | POA: Diagnosis not present

## 2017-02-16 DIAGNOSIS — N189 Chronic kidney disease, unspecified: Secondary | ICD-10-CM | POA: Diagnosis present

## 2017-02-16 DIAGNOSIS — Z95828 Presence of other vascular implants and grafts: Secondary | ICD-10-CM

## 2017-02-16 DIAGNOSIS — J690 Pneumonitis due to inhalation of food and vomit: Secondary | ICD-10-CM | POA: Diagnosis present

## 2017-02-16 DIAGNOSIS — E860 Dehydration: Secondary | ICD-10-CM

## 2017-02-16 DIAGNOSIS — K219 Gastro-esophageal reflux disease without esophagitis: Secondary | ICD-10-CM | POA: Diagnosis present

## 2017-02-16 HISTORY — DX: Acute respiratory failure with hypoxia: J96.01

## 2017-02-16 LAB — CBC WITH DIFFERENTIAL/PLATELET
BASO%: 0.6 % (ref 0.0–2.0)
Basophils Absolute: 0.1 10*3/uL (ref 0.0–0.1)
EOS%: 0.2 % (ref 0.0–7.0)
Eosinophils Absolute: 0 10*3/uL (ref 0.0–0.5)
HCT: 33.6 % — ABNORMAL LOW (ref 34.8–46.6)
HEMOGLOBIN: 11 g/dL — AB (ref 11.6–15.9)
LYMPH%: 6.1 % — AB (ref 14.0–49.7)
MCH: 28.3 pg (ref 25.1–34.0)
MCHC: 32.8 g/dL (ref 31.5–36.0)
MCV: 86.1 fL (ref 79.5–101.0)
MONO#: 0.6 10*3/uL (ref 0.1–0.9)
MONO%: 4.3 % (ref 0.0–14.0)
NEUT%: 88.8 % — ABNORMAL HIGH (ref 38.4–76.8)
NEUTROS ABS: 11.8 10*3/uL — AB (ref 1.5–6.5)
PLATELETS: 126 10*3/uL — AB (ref 145–400)
RBC: 3.91 10*6/uL (ref 3.70–5.45)
RDW: 16.2 % — AB (ref 11.2–14.5)
WBC: 13.2 10*3/uL — AB (ref 3.9–10.3)
lymph#: 0.8 10*3/uL — ABNORMAL LOW (ref 0.9–3.3)

## 2017-02-16 LAB — COMPREHENSIVE METABOLIC PANEL
ALT: 218 U/L — AB (ref 0–55)
AST: 91 U/L — ABNORMAL HIGH (ref 5–34)
Albumin: 2.6 g/dL — ABNORMAL LOW (ref 3.5–5.0)
Alkaline Phosphatase: 69 U/L (ref 40–150)
Anion Gap: 14 mEq/L — ABNORMAL HIGH (ref 3–11)
BUN: 41.3 mg/dL — ABNORMAL HIGH (ref 7.0–26.0)
CO2: 22 mEq/L (ref 22–29)
CREATININE: 2.6 mg/dL — AB (ref 0.6–1.1)
Calcium: 9.2 mg/dL (ref 8.4–10.4)
Chloride: 98 mEq/L (ref 98–109)
EGFR: 18 mL/min/{1.73_m2} — ABNORMAL LOW (ref >60)
GLUCOSE: 245 mg/dL — AB (ref 70–140)
Potassium: 4 mEq/L (ref 3.5–5.1)
SODIUM: 134 meq/L — AB (ref 136–145)
Total Bilirubin: 0.67 mg/dL (ref 0.20–1.20)
Total Protein: 6.8 g/dL (ref 6.4–8.3)

## 2017-02-16 LAB — D-DIMER, QUANTITATIVE: D-Dimer, Quant: 10.3 ug/mL-FEU — ABNORMAL HIGH (ref 0.00–0.50)

## 2017-02-16 MED ORDER — INDIUM-111 PENTETATE: 28.8000 | Freq: Once | INTRAVENOUS | Status: DC | PRN

## 2017-02-16 MED ORDER — LORAZEPAM 0.5 MG PO TABS: 0.5000 mg | ORAL_TABLET | Freq: Four times a day (QID) | ORAL | Status: DC | PRN

## 2017-02-16 MED ORDER — LEVOTHYROXINE SODIUM 50 MCG PO TABS
50.0000 ug | ORAL_TABLET | Freq: Every day | ORAL | Status: DC
  Administered 2017-02-17: 50 ug via ORAL
  Filled 2017-02-16: qty 1

## 2017-02-16 MED ORDER — ENOXAPARIN SODIUM 100 MG/ML ~~LOC~~ SOLN: 90.0000 mg | Freq: Once | SUBCUTANEOUS | Status: DC

## 2017-02-16 MED ORDER — SODIUM CHLORIDE 0.9 % IV SOLN
1.5000 g | Freq: Three times a day (TID) | INTRAVENOUS | Status: DC
  Administered 2017-02-16 – 2017-02-18 (×6): 1.5 g via INTRAVENOUS
  Filled 2017-02-16 (×7): qty 1.5

## 2017-02-16 MED ORDER — SUCRALFATE 1 GM/10ML PO SUSP
1.0000 g | Freq: Three times a day (TID) | ORAL | Status: DC
  Administered 2017-02-16 – 2017-02-17 (×2): 1 g via ORAL
  Filled 2017-02-16 (×2): qty 10

## 2017-02-16 MED ORDER — HYDROCODONE-ACETAMINOPHEN 7.5-325 MG PO TABS: 1.0000 | ORAL_TABLET | Freq: Four times a day (QID) | ORAL | Status: DC | PRN

## 2017-02-16 MED ORDER — HYDRALAZINE HCL 20 MG/ML IJ SOLN: 5.0000 mg | Freq: Four times a day (QID) | INTRAMUSCULAR | Status: DC | PRN

## 2017-02-16 MED ORDER — ATORVASTATIN CALCIUM 10 MG PO TABS
10.0000 mg | ORAL_TABLET | Freq: Every day | ORAL | Status: DC
  Administered 2017-02-17: 10 mg via ORAL
  Filled 2017-02-16: qty 1

## 2017-02-16 MED ORDER — ENOXAPARIN SODIUM 100 MG/ML ~~LOC~~ SOLN
90.0000 mg | Freq: Once | SUBCUTANEOUS | Status: AC
  Administered 2017-02-16: 90 mg via SUBCUTANEOUS
  Filled 2017-02-16: qty 1

## 2017-02-16 MED ORDER — PROMETHAZINE HCL 25 MG RE SUPP: 25.0000 mg | Freq: Four times a day (QID) | RECTAL | Status: DC | PRN

## 2017-02-16 MED ORDER — SODIUM CHLORIDE 0.9 % IV SOLN
INTRAVENOUS | Status: AC
  Administered 2017-02-16 – 2017-02-17 (×2): via INTRAVENOUS

## 2017-02-16 MED ORDER — LIDOCAINE VISCOUS 2 % MT SOLN
20.0000 mL | OROMUCOSAL | Status: DC | PRN
  Filled 2017-02-16: qty 30

## 2017-02-16 MED ORDER — SODIUM CHLORIDE 0.9 % IV SOLN
Freq: Once | INTRAVENOUS | Status: AC
  Administered 2017-02-16: 12:00:00 via INTRAVENOUS

## 2017-02-16 MED ORDER — SODIUM CHLORIDE 0.9% FLUSH
10.0000 mL | Freq: Once | INTRAVENOUS | Status: AC
Start: 2017-02-16 — End: 2017-02-16
  Administered 2017-02-16: 10 mL
  Filled 2017-02-16: qty 10

## 2017-02-16 MED ORDER — PANTOPRAZOLE SODIUM 40 MG PO TBEC
40.0000 mg | DELAYED_RELEASE_TABLET | Freq: Every day | ORAL | Status: DC
  Administered 2017-02-16 – 2017-02-17 (×2): 40 mg via ORAL
  Filled 2017-02-16 (×2): qty 1

## 2017-02-16 MED ORDER — ALLOPURINOL 300 MG PO TABS
300.0000 mg | ORAL_TABLET | Freq: Every day | ORAL | Status: DC
  Administered 2017-02-17: 300 mg via ORAL
  Filled 2017-02-16: qty 1

## 2017-02-16 MED ORDER — TECHNETIUM TC 99M DIETHYLENETRIAME-PENTAACETIC ACID
28.8000 | Freq: Once | INTRAVENOUS | Status: AC | PRN
  Administered 2017-02-16: 28.8 via INTRAVENOUS

## 2017-02-16 MED ORDER — SODIUM CHLORIDE 0.9 % IV BOLUS (SEPSIS)
1000.0000 mL | Freq: Once | INTRAVENOUS | Status: AC
  Administered 2017-02-16: 1000 mL via INTRAVENOUS

## 2017-02-16 MED ORDER — ENOXAPARIN SODIUM 100 MG/ML ~~LOC~~ SOLN: 90.0000 mg | SUBCUTANEOUS | Status: DC

## 2017-02-16 MED ORDER — HEPARIN SOD (PORK) LOCK FLUSH 100 UNIT/ML IV SOLN
500.0000 [IU] | Freq: Once | INTRAVENOUS | Status: DC
  Filled 2017-02-16: qty 5

## 2017-02-16 MED ORDER — TECHNETIUM TO 99M ALBUMIN AGGREGATED
4.1000 | Freq: Once | INTRAVENOUS | Status: AC | PRN
  Administered 2017-02-16: 4.1 via INTRAVENOUS

## 2017-02-16 MED ORDER — POLYETHYLENE GLYCOL 3350 17 G PO PACK: 17.0000 g | PACK | Freq: Every day | ORAL | Status: DC | PRN

## 2017-02-16 MED ORDER — ONDANSETRON HCL 4 MG PO TABS: 8.0000 mg | ORAL_TABLET | Freq: Two times a day (BID) | ORAL | Status: DC | PRN

## 2017-02-16 NOTE — H&P (Signed)
History and Physical    Kristen Escobar WLN:[id number] DOB: 1945-06-27 DOA: 02/16/2017  PCP: Marletta Lor, MD  Patient coming from: home  I have personally briefly reviewed patient's old medical records in [place]  Chief Complaint: SOB  HPI: Kristen Escobar is a 71 y.o. 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 She relates that for the last 3 weeks she's been coughing every time she drinks liquids, any oncologist office she was found to have a saturation of 87% tachycardic at 114 and a blood pressure 118/48 she was placed into a nasal cannula and her saturations improved to 95%.  Oncologist office Course:  She was found to have on albuterol 14 blood pressure 140/78 she was satting 95% on 3 L of nasal cannula, her OB/GYN was 41 her creatinine was 2.6. D-dimer was obtained that was 10, CBC was done that showed a white count of 13 with an [place] of 11.8.emoglobin was 11.0 with an MCV of 86  Review of Systems: As per HPI otherwise 10 point review of systems negative.  Severe consulted for further evaluation.   Past Medical History:  Diagnosis Date  . Arthritis   . Cancer ([place]) 03/2009   breast- rt  . GERD (gastroesophageal reflux disease)   . History of radiation therapy 07/12/10,completed   right breast 60 Gy x30 fx  . Hypertension   . Hypothyroidism   . Obesity   . Peripheral vascular disease ([place]) 1995   PT DEVELOPED CIRCULATION PROBLEMS IN BOTH HANDS AND GANGRENE OF BOTH INDEX FINGERS--REQUIRING AMPUTATION OF THE INDEX FINGERS AND VASCULAR SURGERY.  PT TOLD HER PROBLEMS RELATED TO SMOKING.   NO OTHER PROBLEMS SINCE.    Marland Kitchen Pneumonia   . Thyroid mass     Past Surgical History:  Procedure Laterality Date  . AMPUTATION     partial amputation of both index fingers  . BREAST SURGERY  2011   lumpectomy with node sampling- RIGHT  . COLONOSCOPY    . ESOPHAGOGASTRODUODENOSCOPY    . EXCISION MASS NECK Left 12/26/2016   Procedure: EXCISION MASS NECK;  Surgeon: Izora Gala, MD;  Location: [facility];  Service: ENT;  Laterality: Left;  open excision of thyroid mass left side with frozen section  . HAND SURGERY Bilateral 1995   Amputaed pointer fingers bilaterally  . IR FLUORO GUIDE PORT INSERTION RIGHT  01/14/2017  . IR US GUIDE VASC ACCESS RIGHT  01/14/2017  . TOTAL HIP ARTHROPLASTY  12/16/2011   right hip  . TOTAL HIP ARTHROPLASTY  01/20/2012   Procedure: TOTAL HIP ARTHROPLASTY ANTERIOR APPROACH;  Surgeon: Mauri Pole, MD;  Location: [facility];  Service: Orthopedics;  Laterality: Left;  . TUBAL LIGATION    . VASCULAR SURGERY     both hands     reports that she quit smoking about 23 years ago. She has a 20.00 pack-year smoking history. She has never used smokeless tobacco. She reports that she drinks alcohol. She reports that she does not use drugs.  No Known Allergies  Family History  Problem Relation Age of Onset  . Cancer Mother        pt unaware of what kind  .  Hearing loss Mother   . Coronary artery disease Father   . Diabetes Father   . Coronary artery disease Brother   . Coronary artery disease Brother   . Diabetes Sister   . Cancer Sister        breast  . Colon cancer Neg Hx   . Stomach cancer Neg Hx   . Rectal cancer Neg Hx   . Esophageal cancer Neg Hx     Prior to Admission medications   Medication Sig Start Date End Date Taking? Authorizing Provider  allopurinol (ZYLOPRIM) 300 MG tablet Take 1 tablet (300 mg total) by mouth daily. 01/01/17   Nicholas Lose, MD  atorvastatin (LIPITOR) 10 MG tablet TAKE 1 TABLET EVERY DAY 10/06/16   Marletta Lor, MD  chlorpheniramine-HYDROcodone  (TUSSIONEX) 10-8 MG/5ML SUER Take 5 mLs by mouth every 12 (twelve) hours as needed for cough. 02/11/17   Tanner, Lyndon Code., PA-C  HYDROcodone-acetaminophen (NORCO) 7.5-325 MG tablet Take 1 tablet by mouth every 6 (six) hours as needed for moderate pain. 12/26/16   Izora Gala, MD  levothyroxine (SYNTHROID, LEVOTHROID) 50 MCG tablet TAKE 1 TABLET BY MOUTH DAILY 09/08/16   Marletta Lor, MD  lidocaine (XYLOCAINE) 2 % solution Use as directed 20 mLs in the mouth or throat every 3 (three) hours as needed for mouth pain. 12/26/16   Izora Gala, MD  lidocaine-prilocaine (EMLA) cream Apply to affected area once 01/01/17   Nicholas Lose, MD  LORazepam (ATIVAN) 0.5 MG tablet Take 1 tablet (0.5 mg total) by mouth every 6 (six) hours as needed (Nausea or vomiting). 01/01/17   Nicholas Lose, MD  losartan-hydrochlorothiazide (HYZAAR) 100-25 MG tablet TAKE 1 TABLET BY MOUTH EVERY DAY 12/25/16   Marletta Lor, MD  magic mouthwash w/lidocaine SOLN Take 5 mLs by mouth 3 (three) times daily as needed for mouth pain. 01/16/17   Nicholas Lose, MD  omeprazole (PRILOSEC) 20 MG capsule TAKE 1 CAPSULE BY MOUTH EVERY DAY 12/24/16   Marletta Lor, MD  ondansetron (ZOFRAN) 8 MG tablet Take 1 tablet (8 mg total) by mouth 2 (two) times daily as needed for refractory nausea / vomiting. Start on day 3 after cyclophosphamide chemotherapy. 01/01/17   Nicholas Lose, MD  predniSONE (DELTASONE) 20 MG tablet Take 3 tablets (60 mg total) by mouth daily. Take on days 1-5 of chemotherapy. 01/01/17   Nicholas Lose, MD  prochlorperazine (COMPAZINE) 10 MG tablet Take 1 tablet (10 mg total) by mouth every 6 (six) hours as needed (Nausea or vomiting). Patient not taking: Reported on 01/20/2017 01/01/17   Nicholas Lose, MD  promethazine (PHENERGAN) 25 MG suppository Place 1 suppository (25 mg total) rectally every 6 (six) hours as needed for nausea or vomiting. Patient not taking: Reported on 01/20/2017 12/26/16   Izora Gala, MD  sucralfate  (CARAFATE) 1 GM/10ML suspension Take 10 mLs (1 g total) by mouth 4 (four) times daily -  with meals and at bedtime. 12/12/16   Yetta Flock, MD    Physical Exam: Vitals:   02/16/17 1623  BP: (!) 114/58  Pulse: 97  Resp: (!) 22  Temp: (!) 97.5 F (36.4 C)  TempSrc: Oral  SpO2: 97%    Constitutional: NAD, calm, comfortable Vitals:   02/16/17 1623  BP: (!) 114/58  Pulse: 97  Resp: (!) 22  Temp: (!) 97.5 F (36.4 C)  TempSrc: Oral  SpO2: 97%   Eyes: PERRL, lids and conjunctivae normal ENMT: Mucous membranes are moist. Posterior pharynx clear of  any exudate or lesions.Normal dentition.  Neck: normal, supple, no masses, no thyromegaly Respiratory: clear to auscultation bilaterally, no wheezing, no crackles. Normal respiratory effort. No accessory muscle use.  Cardiovascular: Regular rate and rhythm, no murmurs / rubs / gallops. No extremity edema. 2+ pedal pulses. No carotid bruits.  Abdomen: no tenderness, no masses palpated. No hepatosplenomegaly. Bowel sounds positive.  Musculoskeletal: no clubbing / cyanosis. No joint deformity upper and lower extremities. Good ROM, no contractures. Normal muscle tone.  Skin: no rashes, lesions, ulcers. No induration Neurologic: CN 2-12 grossly intact. Sensation intact, DTR normal. Strength 5/5 in all 4.  Psychiatric: Normal judgment and insight. Alert and oriented x 3. Normal mood.   Labs on Admission: I have personally reviewed following labs and imaging studies  CBC:  Recent Labs Lab 02/11/17 1048 02/16/17 1056  WBC 6.7 13.2*  NEUTROABS 6.0 11.8*  HGB 10.1* 11.0*  HCT 30.9* 33.6*  MCV 86.4 86.1  PLT 104* 299*   Basic Metabolic Panel:  Recent Labs Lab 02/11/17 1048 02/16/17 1056  NA 138 134*  K 3.8 4.0  CO2 23 22  GLUCOSE 162* 245*  BUN 34.1* 41.3*  CREATININE 1.1 2.6*  CALCIUM 8.5 9.2   GFR: Estimated Creatinine Clearance: 21.9 mL/min (A) (by C-G formula based on SCr of 2.6 mg/dL (H)). Liver Function  Tests:  Recent Labs Lab 02/11/17 1048 02/16/17 1056  AST 84* 91*  ALT 449* 218*  ALKPHOS 70 69  BILITOT 0.65 0.67  PROT 5.9* 6.8  ALBUMIN 2.6* 2.6*   No results for input(s): LIPASE, AMYLASE in the last 168 hours. No results for input(s): AMMONIA in the last 168 hours. Coagulation Profile: No results for input(s): INR, PROTIME in the last 168 hours. Cardiac Enzymes: No results for input(s): CKTOTAL, CKMB, CKMBINDEX, TROPONINI in the last 168 hours. BNP (last 3 results) No results for input(s): PROBNP in the last 8760 hours. HbA1C: No results for input(s): HGBA1C in the last 72 hours. CBG: No results for input(s): GLUCAP in the last 168 hours. Lipid Profile: No results for input(s): CHOL, HDL, LDLCALC, TRIG, CHOLHDL, LDLDIRECT in the last 72 hours. Thyroid Function Tests: No results for input(s): TSH, T4TOTAL, FREET4, T3FREE, THYROIDAB in the last 72 hours. Anemia Panel: No results for input(s): VITAMINB12, FOLATE, FERRITIN, TIBC, IRON, RETICCTPCT in the last 72 hours. Urine analysis:    Component Value Date/Time   COLORURINE YELLOW 01/14/2012 1030   APPEARANCEUR CLOUDY (A) 01/14/2012 1030   LABSPEC 1.018 01/14/2012 1030   PHURINE 6.5 01/14/2012 1030   GLUCOSEU NEGATIVE 01/14/2012 1030   GLUCOSEU NEGATIVE 07/24/2009 0750   HGBUR TRACE (A) 01/14/2012 1030   BILIRUBINUR NEGATIVE 01/14/2012 1030   KETONESUR NEGATIVE 01/14/2012 1030   PROTEINUR NEGATIVE 01/14/2012 1030   UROBILINOGEN 0.2 01/14/2012 1030   NITRITE NEGATIVE 01/14/2012 1030   LEUKOCYTESUR TRACE (A) 01/14/2012 1030    Radiological Exams on Admission: Dg Chest 2 View  Result Date: 02/16/2017 CLINICAL DATA:  Dyspnea on exertion. Hypoxia. Progressive shortness breath for 2 days. Esophageal cancer. EXAM: CHEST  2 VIEW COMPARISON:  Chest film associated with Port-A-Cath insertion 01/14/2017. PET scan 12/08/2016. FINDINGS: Heart size is normal. Lung volumes are low. There is no edema or effusion. No focal  airspace disease present. Aortic atherosclerosis is noted. A right IJ Port-A-Cath is stable. Surgical clips are noted in the right breast and axilla. The upper abdomen is unremarkable. IMPRESSION: 1. No acute cardiopulmonary disease. 2. Low lung volumes. Electronically Signed   By: Wynetta Fines.D.  On: 02/16/2017 13:05    EKG: Independently reviewed. None  Assessment/Plan Acute on chronic respiratory failure with hypoxia [facility]): Of unclear etiology, she has been aspirating for the last 3 weeks, every time she drinks fluids, with a history of a neck mass pressing on the laryngeal recurrent nerve and hoarseness on her voice, I am concerned about an aspiration episode. Her chest x-ray does not show any infiltrates but she did spike a temperature with a white count of 13.6 and hypoxic. I will start her empirically on IV Unasyn. Her d-dimer was elevated which can be explained by an infection, will also get a VQ scan due to her renal function which is 2.6,  She has been started empirically on Lovenox therapeutic for possible PE. Check a speech evaluation PTOT consult.  Essential hypertension: Continue hold antihypertensive medication, will use hydralazine IV when necessary  AKI (acute kidney injury) ([place]) on Chronic kidney disease stage III: Renal dysfunction with a baseline creatinine 1.4 Likely prerenal in etiology, will start on aggressive fluid hydration and recheck a basic metabolic panel in the morning. Mardene Celeste cousin also placed nothing by mouth for now.  Diffuse large B cell lymphoma ([place]) To follow-up with oncology as an outpatient.  DVT prophylaxis: Lovenox Code Status: full Family Communication: husband Disposition Plan: unable to determine Consults called: None Admission status: inpatient   Charlynne Cousins MD [facility] Pager [phone]  If 7PM-7AM, please contact night-coverage [email or web address] Password [email or web address]  02/16/2017, 5:46 PM

## 2017-02-16 NOTE — Progress Notes (Signed)
Symptoms Management Clinic Progress Note   Kristen Escobar [id number] 1946-04-03 71 y.o.  [name] is managed by Dr. Nicholas Lose  Actively treated with chemotherapy: yes  Current Therapy: R CHOP  Last Treated: 02/06/2017  Assessment: Plan:    Hypoxia - Plan: NM Pulmonary Perf and Vent  Other pulmonary embolism without acute cor pulmonale, unspecified chronicity ([place]) - Plan: enoxaparin (LOVENOX) injection 90 mg, DISCONTINUED: enoxaparin (LOVENOX) injection 90 mg  Diffuse large B-cell lymphoma, unspecified body region ([place])   Hypoxia, rule out acute PE: A V/Q scan is pending. The patient is been dosed with Lovenox 90 mg subcutaneous 1. The patient is being admitted to [facility] for management given her positive d-dimer.  Diffuse large B-cell lymphoma: Patient is status post cycle 2 of R CHOP dosed on 02/06/2017.  Please see After Visit Summary for patient specific instructions.  Future Appointments Date Time Provider [place]  02/16/2017 4:00 PM [facility]-NM 1 [facility]NM [place]  02/18/2017 9:00 AM [facility] LAB 2 [facility] None  02/18/2017 10:00 AM [facility]NAGEMENT CLIN[facility] 2 [facility] None  02/25/2017 10:00 AM SYMPTOM MANAGEMENT CLINIC 2 [facility] None  02/27/2017 7:45 AM [facility] LAB 6 [facility] None  02/27/2017 8:00 AM [facility] INJ NURSE [facility] None  02/27/2017 8:30 AM Causey, Charlestine Massed, NP [facility] None  02/27/2017 9:30 AM [facility] C8 [facility] None  03/20/2017 8:30 AM [facility] LAB 5 [facility] None  03/20/2017 8:45 AM [facility] J32 DNS [facility] None  03/20/2017 9:15 AM Nicholas Lose, MD [facility] None  03/20/2017 10:15 AM [facility] C10 [facility] None  03/20/2017 11:15 AM Karie Mainland, RD [facility] None  03/24/2017 8:45 AM Marletta Lor, MD [facility]  08/04/2017 8:30 AM Nicholas Lose, MD [facility] None    Orders Placed This Encounter  Procedures  . NM Pulmonary Perf and Vent         Subjective:   Patient ID:  Kristen Escobar is a 71 y.o. (DOB Jul 24, 1945) female.  Chief Complaint: No chief complaint on file.   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 This was positive for LCA, CD20, CD79a, PAX-5, BCL-6 and weak cytoplasmic kappa associated with patchy weak positivity for the BCL-2 and CD10, KI-67 was at 40%. The patient had severe lymphadenopathy in the neck and invasion into the esophagus. She is status post cycle 2 of R CHOP dosed on 02/06/2017. [name] husband called this morning stating the patient had become weaker over the past week and was having shortness of breath. Her shortness of breath was increased when walking from room to room. This was new for her.  She is taking in fluids but at a very small volume. She reported having one fever Friday of 100.4. She defervesced on her own without any medications and has not had an additional fever since that time. She reports having a cough with yellowish sputum. She was noted to have an oxygen saturation of 87% on room air when she arrived today and was tachycardic at 114 with a blood pressure of 118/48. She was placed on 2 L of oxygen via nasal cannula at which time her oxygen saturation raised to 92-93 percent. At 3 L by nasal cannula her oxygen saturation increased to 95%. She has had a history of choking on solids and liquids. Based on this she was referred for a chest x-ray which returned negative with no evidence of aspiration pneumonia or other abnormal findings.  Unfortunately her creatinine was at 2.6 and BUN was 41.3 today. These are likely due to dehydration.  Consideration been given for a CT angiogram of the chest. Unfortunately this was not possible since the patient could not be dosed with IV contrast today. Instead a d-dimer was drawn which returned elevated at 10.3. Nuclear medicine has been contacted with a V/Q  scan ordered. Dr. Candiss Norse has graciously agreed to accept this patient for direct admission to [facility].  Medications: I have reviewed the patient's current medications.  Allergies: No Known Allergies  Past Medical History:  Diagnosis Date  . Arthritis   . Cancer ([place]) 03/2009   breast- rt  . GERD (gastroesophageal reflux disease)   . History of radiation therapy 07/12/10,completed   right breast 60 Gy x30 fx  . Hypertension   . Hypothyroidism   . Obesity   . Peripheral vascular disease ([place]) 1995   PT DEVELOPED CIRCULATION PROBLEMS IN BOTH HANDS AND GANGRENE OF BOTH INDEX FINGERS--REQUIRING AMPUTATION OF THE INDEX FINGERS AND VASCULAR SURGERY.  PT TOLD HER PROBLEMS RELATED TO SMOKING.   NO OTHER PROBLEMS SINCE.  Marland Kitchen Pneumonia   . Thyroid mass     Past Surgical History:  Procedure Laterality Date  . AMPUTATION     partial amputation of both index fingers  . BREAST SURGERY  2011   lumpectomy with node sampling- RIGHT  . COLONOSCOPY    . ESOPHAGOGASTRODUODENOSCOPY    . EXCISION MASS NECK Left 12/26/2016   Procedure: EXCISION MASS NECK;  Surgeon: Izora Gala, MD;  Location: [place];  Service: ENT;  Laterality: Left;  open excision of thyroid mass left side with frozen section  . HAND SURGERY Bilateral 1995   Amputaed pointer fingers bilaterally  . IR FLUORO GUIDE PORT INSERTION RIGHT  01/14/2017  . IR US GUIDE VASC ACCESS RIGHT  01/14/2017  . TOTAL HIP ARTHROPLASTY  12/16/2011   right hip  . TOTAL HIP ARTHROPLASTY  01/20/2012   Procedure: TOTAL HIP ARTHROPLASTY ANTERIOR APPROACH;  Surgeon: Mauri Pole, MD;  [facility];  Service: Orthopedics;  Laterality: Left;  . TUBAL LIGATION    . VASCULAR SURGERY     both hands    Family History  Problem Relation Age of Onset  . Cancer Mother        pt unaware of what kind  . Hearing loss Mother   . Coronary artery disease Father   . Diabetes Father   . Coronary artery disease Brother   . Coronary artery disease Brother   .  Diabetes Sister   . Cancer Sister        breast  . Colon cancer Neg Hx   . Stomach cancer Neg Hx   . Rectal cancer Neg Hx   . Esophageal cancer Neg Hx     Social History   Social History  . Marital status: Married    Spouse name: N/A  . Number of children: N/A  . Years of education: N/A   Occupational History  . Not on file.   Social History Main Topics  . Smoking status: Former Smoker    Packs/day: 1.00    Years: 20.00    Quit date: 04/21/1993  . Smokeless tobacco: Never Used  . Alcohol use Yes     Comment: OCCAS - MAYBE ONCE A MONTH  . Drug use: No  . Sexual activity: Yes   Other Topics Concern  . Not on file   Social History Narrative  . No  narrative on file    Past Medical History, Surgical history, Social history, and Family history were reviewed and updated as appropriate.   Please see review of systems for further details on the patient's review from today.   Review of Systems:  Review of Systems  Constitutional: Positive for appetite change and fever. Negative for activity change, chills and diaphoresis.  HENT: Positive for trouble swallowing.   Respiratory: Positive for cough, choking and shortness of breath.   Cardiovascular: Positive for palpitations. Negative for chest pain and leg swelling.    Objective:   Physical Exam:  There were no vitals taken for this visit. ECOG: 2   Physical Exam  Constitutional: No distress.  HENT:  Head: Normocephalic and atraumatic.  Right Ear: External ear normal.  Left Ear: External ear normal.  Mouth/Throat: Oropharynx is clear and moist. No oropharyngeal exudate.  Eyes: Right eye exhibits no discharge. Left eye exhibits no discharge. No scleral icterus.  Neck: Normal range of motion.  Cardiovascular: S1 normal and S2 normal.  Tachycardia present.   Pulmonary/Chest: No respiratory distress. She has wheezes (Inspiratory wheezes in the left upper lung.).      Musculoskeletal: She exhibits no edema.  Calves  are bilaterally equal at 54 cm at 7 cm distal to the inferior pole of the patella.  Lymphadenopathy:    She has no cervical adenopathy.  Neurological: She is alert.  Skin: Skin is warm and dry. She is not diaphoretic.    Lab Review:     Component Value Date/Time   NA 134 (L) 02/16/2017 1056   K 4.0 02/16/2017 1056   CL 101 12/24/2016 1133   CL 105 03/10/2012 0859   CO2 22 02/16/2017 1056   GLUCOSE 245 (H) 02/16/2017 1056   GLUCOSE 149 (H) 03/10/2012 0859   BUN 41.3 (H) 02/16/2017 1056   CREATININE 2.6 (H) 02/16/2017 1056   CALCIUM 9.2 02/16/2017 1056   PROT 6.8 02/16/2017 1056   ALBUMIN 2.6 (L) 02/16/2017 1056   AST 91 (H) 02/16/2017 1056   ALT 218 (H) 02/16/2017 1056   ALKPHOS 69 02/16/2017 1056   BILITOT 0.67 02/16/2017 1056   GFRNONAA 33 (L) 12/24/2016 1133   GFRAA 38 (L) 12/24/2016 1133       Component Value Date/Time   WBC 13.2 (H) 02/16/2017 1056   WBC 11.9 (H) 01/14/2017 0745   RBC 3.91 02/16/2017 1056   RBC 4.50 01/14/2017 0745   HGB 11.0 (L) 02/16/2017 1056   HCT 33.6 (L) 02/16/2017 1056   PLT 126 (L) 02/16/2017 1056   MCV 86.1 02/16/2017 1056   MCH 28.3 02/16/2017 1056   MCH 28.4 01/14/2017 0745   MCHC 32.8 02/16/2017 1056   MCHC 34.1 01/14/2017 0745   RDW 16.2 (H) 02/16/2017 1056   LYMPHSABS 0.8 (L) 02/16/2017 1056   MONOABS 0.6 02/16/2017 1056   EOSABS 0.0 02/16/2017 1056   EOSABS 0.4 05/02/2009 1517   BASOSABS 0.1 02/16/2017 1056   -------------------------------  Imaging from last 24 hours (if applicable):  Radiology interpretation: Dg Chest 2 View  Result Date: 02/16/2017 CLINICAL DATA:  Dyspnea on exertion. Hypoxia. Progressive shortness breath for 2 days. Esophageal cancer. EXAM: CHEST  2 VIEW COMPARISON:  Chest film associated with Port-A-Cath insertion 01/14/2017. PET scan 12/08/2016. FINDINGS: Heart size is normal. Lung volumes are low. There is no edema or effusion. No focal airspace disease present. Aortic atherosclerosis is noted. A  right IJ Port-A-Cath is stable. Surgical clips are noted in the right breast and axilla.  The upper abdomen is unremarkable. IMPRESSION: 1. No acute cardiopulmonary disease. 2. Low lung volumes. Electronically Signed   By: San Morelle M.D.   On: 02/16/2017 13:05        This case was discussed with Dr. Lindi Adie. He expresses agreement with my management of this patient.

## 2017-02-16 NOTE — Progress Notes (Signed)
ANTICOAGULATION CONSULT NOTE - Initial Consult  Pharmacy Consult for Lovenox Indication: possible PE  No Known Allergies  Patient Measurements:   Documented height 63 inches, weight 93.6kg  Vital Signs: Temp: 97.5 F (36.4 C) (10/29 1623) Temp Source: Oral (10/29 1623) BP: 114/58 (10/29 1623) Pulse Rate: 97 (10/29 1623)  Labs:  Recent Labs  02/16/17 1056 02/16/17 1056  HGB 11.0*  --   HCT 33.6*  --   PLT 126*  --   CREATININE  --  2.6*    Estimated Creatinine Clearance: 21.9 mL/min (A) (by C-G formula based on SCr of 2.6 mg/dL (H)).   Medical History: Past Medical History:  Diagnosis Date  . Arthritis   . Cancer ([place]) 03/2009   breast- rt  . GERD (gastroesophageal reflux disease)   . History of radiation therapy 07/12/10,completed   right breast 60 Gy x30 fx  . Hypertension   . Hypothyroidism   . Obesity   . Peripheral vascular disease ([place]) 1995   PT DEVELOPED CIRCULATION PROBLEMS IN BOTH HANDS AND GANGRENE OF BOTH INDEX FINGERS--REQUIRING AMPUTATION OF THE INDEX FINGERS AND VASCULAR SURGERY.  PT TOLD HER PROBLEMS RELATED TO SMOKING.   NO OTHER PROBLEMS SINCE.  Marland Kitchen Pneumonia   . Thyroid mass     Medications:  Scheduled:  . allopurinol  300 mg Oral Daily  . atorvastatin  10 mg Oral Daily  . levothyroxine  50 mcg Oral Daily  . pantoprazole  40 mg Oral Daily  . sucralfate  1 g Oral TID WC & HS   Infusions:  . sodium chloride    . ampicillin-sulbactam (UNASYN) IV    . sodium chloride      Assessment: 3 yoF admitted on 10/29 with acute on chronic respiratory failure.  Possible PE with elevated D-dimer 10.3.  VQ Scan pending.  Pharmacy is consulted to dose Lovenox for possible PE.  Most recent Lovenox 90 mg given at [facility] on 10/29 at 1514.  AKI on CKD:  SCr 2.6 with CrCl ~ 22 ml/min CBC: Hgb 11, Plt 126   Goal of Therapy:  Anti-Xa level 0.6-1 units/ml 4hrs after LMWH dose given Monitor platelets by anticoagulation protocol: Yes   Plan:   Lovenox  90 mg IV q24h, next dose due on 10/30 at 1500  Monitor SCr, CBC, bleeding  Gretta Arab PharmD, BCPS Pager [phone] 02/16/2017 6:10 PM

## 2017-02-16 NOTE — Progress Notes (Signed)
Symptoms Management Clinic Progress Note   Kristen Escobar [id number] Aug 27, 1945 71 y.o.  Kristen Escobar is managed by Dr. Nicholas Lose  Actively treated with chemotherapy: yes  Current Therapy: R CHOP  Last Treated: 02/06/2017  Assessment: Plan:    Port-A-Cath in place - Plan: heparin lock flush 100 unit/mL, sodium chloride flush (NS) 0.9 % injection 10 mL, 0.9 %  sodium chloride infusion  Dyspnea on exertion - Plan: DG Chest 2 View, D-dimer, quantitative, CANCELED: CT ANGIO CHEST PE W OR WO CONTRAST  Hypoxia - Plan: DG Chest 2 View, D-dimer, quantitative, CANCELED: CT ANGIO CHEST PE W OR WO CONTRAST  Diffuse large B-cell lymphoma, unspecified body region ([place]) - Plan: heparin lock flush 100 unit/mL, sodium chloride flush (NS) 0.9 % injection 10 mL, 0.9 %  sodium chloride infusion   Dyspnea on exertion and hypoxia, rule out pulmonary emboli: D-dimer came back positive today at 10.3. Unfortunately a CT angiogram of the chest could not be completed due to the patient's creatinine of 2.6. She was given Lovenox 90 mg subcutaneous 1 and is pending a V/Q scan. She is being admitted to [facility] for management.  Diffuse large B-cell lymphoma: The patient is status post cycle 2 of R CHOP dosed on 02/06/2017.   Please see After Visit Summary for patient specific instructions.  Future Appointments Date Time Provider [place]  02/16/2017 4:00 PM WL-NM 1 [facility]-NM [place]  02/18/2017 9:00 AM [facility] LAB 2 [facility] None  02/18/2017 10:00 AM SYMPTOM MANAGEMENT CLINIC 2 [facility] None  02/25/2017 10:00 AM SYMPTOM MANAGEMENT CLINIC 2 [facility] None  02/27/2017 7:45 AM [facility] LAB 6 [facility] None  02/27/2017 8:00 AM [facility] INJ NURSE [facility] None  02/27/2017 8:30 AM Causey, Charlestine Massed, NP [facility] None  02/27/2017 9:30 AM [facility] C8 [facility] None  03/20/2017 8:30 AM [facility] LAB 5 [facility] None  03/20/2017 8:45 AM [facility] J32 DNS  [facility] None  03/20/2017 9:15 AM Nicholas Lose, MD [facility] None  03/20/2017 10:15 AM [facility] C10 [facility] None  03/20/2017 11:15 AM Karie Mainland, RD [facility] None  03/24/2017 8:45 AM Marletta Lor, MD [facility]  08/04/2017 8:30 AM Nicholas Lose, MD [facility] None    Orders Placed This Encounter  Procedures  . DG Chest 2 View  . D-dimer, quantitative       Subjective:   Patient ID:  Shauntae Reitman is a 71 y.o. (DOB 02/28/1946) female.  Chief Complaint:  Chief Complaint  Patient presents with  . 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 This was positive for LCA, CD20, CD79a, PAX-5, BCL-6 and weak cytoplasmic kappa associated with patchy weak positivity for the BCL-2 and CD10, KI-67 was at 40%. The patient had severe lymphadenopathy in the neck and invasion into the esophagus. She is status post cycle 2 of R CHOP dosed on 02/06/2017. [name] husband called this morning stating the patient had become weaker over the past week and was having shortness of breath. Her shortness of breath was increased when walking from room to room. This was new for her.  She is taking in fluids but at a very small volume. She reported having one fever Friday of 100.4. She defervesced on her own without any medications and has not had an additional fever since that time. She has had a cough with some yellowish sputum production. She was noted to have an  oxygen saturation of 87% on room air when she arrived today and was tachycardic at 114 with a blood pressure of 118/48. She was placed on 2 L of oxygen via nasal cannula at which time her oxygen saturation raised to 92-93 percent. At 3 L by nasal cannula her oxygen saturation increased to 95%. She has had a history of choking on solids and liquids. Based on this she was referred for a chest x-ray which  returned negative with no evidence of aspiration pneumonia or other abnormal findings.  Unfortunately her creatinine was at 2.6 and BUN was 41.3 today. These are likely due to dehydration.  Consideration been given for a CT angiogram of the chest. Unfortunately this was not possible since the patient could not be dosed with IV contrast today. Instead a d-dimer was drawn which returned elevated at 10.3. Nuclear medicine has been contacted with a V/Q scan ordered. Dr. Thedore Mins has graciously agreed to accept this patient for direct admission to [facility].  Medications: I have reviewed the patient's current medications.  Allergies: No Known Allergies  Past Medical History:  Diagnosis Date  . Arthritis   . Cancer ([facility]) 03/2009   breast- rt  . GERD (gastroesophageal reflux disease)   . History of radiation therapy 07/12/10,completed   right breast 60 Gy x30 fx  . Hypertension   . Hypothyroidism   . Obesity   . Peripheral vascular disease ([facility]) 1995   PT DEVELOPED CIRCULATION PROBLEMS IN BOTH HANDS AND GANGRENE OF BOTH INDEX FINGERS--REQUIRING AMPUTATION OF THE INDEX FINGERS AND VASCULAR SURGERY.  PT TOLD HER PROBLEMS RELATED TO SMOKING.   NO OTHER PROBLEMS SINCE.  Marland Kitchen Pneumonia   . Thyroid mass     Past Surgical History:  Procedure Laterality Date  . AMPUTATION     partial amputation of both index fingers  . BREAST SURGERY  2011   lumpectomy with node sampling- RIGHT  . COLONOSCOPY    . ESOPHAGOGASTRODUODENOSCOPY    . EXCISION MASS NECK Left 12/26/2016   Procedure: EXCISION MASS NECK;  Surgeon: Serena Colonel, MD;  [facility];  Service: ENT;  Laterality: Left;  open excision of thyroid mass left side with frozen section  . HAND SURGERY Bilateral 1995   Amputaed pointer fingers bilaterally  . IR FLUORO GUIDE PORT INSERTION RIGHT  01/14/2017  . IR US GUIDE VASC ACCESS RIGHT  01/14/2017  . TOTAL HIP ARTHROPLASTY  12/16/2011   right hip  . TOTAL HIP ARTHROPLASTY  01/20/2012   Procedure: TOTAL  HIP ARTHROPLASTY ANTERIOR APPROACH;  Surgeon: Shelda Pal, MD;  [facility];  Service: Orthopedics;  Laterality: Left;  . TUBAL LIGATION    . VASCULAR SURGERY     both hands    Family History  Problem Relation Age of Onset  . Cancer Mother        pt unaware of what kind  . Hearing loss Mother   . Coronary artery disease Father   . Diabetes Father   . Coronary artery disease Brother   . Coronary artery disease Brother   . Diabetes Sister   . Cancer Sister        breast  . Colon cancer Neg Hx   . Stomach cancer Neg Hx   . Rectal cancer Neg Hx   . Esophageal cancer Neg Hx     Social History   Social History  . Marital status: Married    Spouse name: N/A  . Number of children: N/A  . Years  of education: N/A   Occupational History  . Not on file.   Social History Main Topics  . Smoking status: Former Smoker    Packs/day: 1.00    Years: 20.00    Quit date: 04/21/1993  . Smokeless tobacco: Never Used  . Alcohol use Yes     Comment: OCCAS - MAYBE ONCE A MONTH  . Drug use: No  . Sexual activity: Yes   Other Topics Concern  . Not on file   Social History Narrative  . No narrative on file    Past Medical History, Surgical history, Social history, and Family history were reviewed and updated as appropriate.   Please see review of systems for further details on the patient's review from today.   Review of Systems:  Review of Systems  Constitutional: Positive for appetite change, fatigue and fever. Negative for activity change, chills and diaphoresis.  HENT: Positive for trouble swallowing.   Respiratory: Positive for cough, choking and shortness of breath. Negative for chest tightness and wheezing.   Cardiovascular: Positive for palpitations. Negative for chest pain and leg swelling.    Objective:   Physical Exam:  BP (!) 128/45 (BP Location: Left Arm, Patient Position: Sitting)   Pulse 95   Temp 98.2 F (36.8 C) (Oral)   Resp 16   Ht '5\' 3"'$  (1.6 m)    Wt 206 lb 6.4 oz (93.6 kg)   SpO2 96%   BMI 36.56 kg/m  ECOG: 2   Physical Exam  Constitutional: No distress.  HENT:  Head: Normocephalic and atraumatic.  Right Ear: External ear normal.  Left Ear: External ear normal.  Mouth/Throat: Oropharynx is clear and moist. No oropharyngeal exudate.  Eyes: Right eye exhibits no discharge. Left eye exhibits no discharge.  Neck: Normal range of motion. Neck supple.  Cardiovascular: S1 normal and S2 normal.  Tachycardia present.   Pulmonary/Chest: She has wheezes in the left upper field.      Musculoskeletal: She exhibits no edema.  [name] calves are bilaterally equal at 54 cm at 7 cm distal to the inferior pole of the patella.  Lymphadenopathy:    She has no cervical adenopathy.  Neurological: She is alert.  Skin: Skin is warm and dry. She is not diaphoretic.    Lab Review:     Component Value Date/Time   NA 134 (L) 02/16/2017 1056   K 4.0 02/16/2017 1056   CL 101 12/24/2016 1133   CL 105 03/10/2012 0859   CO2 22 02/16/2017 1056   GLUCOSE 245 (H) 02/16/2017 1056   GLUCOSE 149 (H) 03/10/2012 0859   BUN 41.3 (H) 02/16/2017 1056   CREATININE 2.6 (H) 02/16/2017 1056   CALCIUM 9.2 02/16/2017 1056   PROT 6.8 02/16/2017 1056   ALBUMIN 2.6 (L) 02/16/2017 1056   AST 91 (H) 02/16/2017 1056   ALT 218 (H) 02/16/2017 1056   ALKPHOS 69 02/16/2017 1056   BILITOT 0.67 02/16/2017 1056   GFRNONAA 33 (L) 12/24/2016 1133   GFRAA 38 (L) 12/24/2016 1133       Component Value Date/Time   WBC 13.2 (H) 02/16/2017 1056   WBC 11.9 (H) 01/14/2017 0745   RBC 3.91 02/16/2017 1056   RBC 4.50 01/14/2017 0745   HGB 11.0 (L) 02/16/2017 1056   HCT 33.6 (L) 02/16/2017 1056   PLT 126 (L) 02/16/2017 1056   MCV 86.1 02/16/2017 1056   MCH 28.3 02/16/2017 1056   MCH 28.4 01/14/2017 0745   MCHC 32.8 02/16/2017 1056  MCHC 34.1 01/14/2017 0745   RDW 16.2 (H) 02/16/2017 1056   LYMPHSABS 0.8 (L) 02/16/2017 1056   MONOABS 0.6 02/16/2017 1056    EOSABS 0.0 02/16/2017 1056   EOSABS 0.4 05/02/2009 1517   BASOSABS 0.1 02/16/2017 1056   -------------------------------  Imaging from last 24 hours (if applicable):  Radiology interpretation: Dg Chest 2 View  Result Date: 02/16/2017 CLINICAL DATA:  Dyspnea on exertion. Hypoxia. Progressive shortness breath for 2 days. Esophageal cancer. EXAM: CHEST  2 VIEW COMPARISON:  Chest film associated with Port-A-Cath insertion 01/14/2017. PET scan 12/08/2016. FINDINGS: Heart size is normal. Lung volumes are low. There is no edema or effusion. No focal airspace disease present. Aortic atherosclerosis is noted. A right IJ Port-A-Cath is stable. Surgical clips are noted in the right breast and axilla. The upper abdomen is unremarkable. IMPRESSION: 1. No acute cardiopulmonary disease. 2. Low lung volumes. Electronically Signed   By: San Morelle M.D.   On: 02/16/2017 13:05        This case was discussed with Dr. Lindi Adie. He expresses agreement with my management of this patient.

## 2017-02-16 NOTE — Progress Notes (Signed)
Pt's 02 sat's were initially 87% on room air. Pt states she feels very SOB.  Placed pt on 2l/min [place]  With 02 sat's increasing to 92%. Increased to 3l/min [place] and 02 sats staying around 95%. Pt tachycardic as well. Sandi Mealy, PA made aware.   12:30 pm  Pt transported to Radiology for STAT CXR via w/c and 02 per 2NT's.  Pt to return to [facility] after CXR.  VSS. 02 sat staying in the mid 90's on 3l/min. Pt to be admitted to telemetry unit with suspected PE.  + d-dimer results.  Port dressing changed - has biopatch and sorbaview dressing in prepararion for admission.  Report called to 4 Eliberto Ivory RN  Pt given 1st dose of Lovenox prior to transfer.  1545  Transferred to room #1419 via w/c and 02 @ 3l/min. Husband and daughter with pt

## 2017-02-16 NOTE — Telephone Encounter (Signed)
TC from pt's husband this am.  He states that pt has become much weaker over the weekend and SOB. Becomes SOB when walking room to room. This is new for her. She is taking fluids in but it is still very slow.  He is requesting a visit with Sandi Mealy, PA in [facility]  This am.  STAT Lab and visit request sent. Lab @ 10:30 and [facility] appt @ 11am

## 2017-02-17 ENCOUNTER — Inpatient Hospital Stay (HOSPITAL_COMMUNITY): Payer: Medicare Other

## 2017-02-17 DIAGNOSIS — R1313 Dysphagia, pharyngeal phase: Secondary | ICD-10-CM

## 2017-02-17 DIAGNOSIS — C8330 Diffuse large B-cell lymphoma, unspecified site: Secondary | ICD-10-CM

## 2017-02-17 DIAGNOSIS — J9621 Acute and chronic respiratory failure with hypoxia: Secondary | ICD-10-CM

## 2017-02-17 DIAGNOSIS — J860 Pyothorax with fistula: Principal | ICD-10-CM

## 2017-02-17 DIAGNOSIS — R0602 Shortness of breath: Secondary | ICD-10-CM

## 2017-02-17 LAB — COMPREHENSIVE METABOLIC PANEL
ALT: 160 U/L — AB (ref 14–54)
AST: 90 U/L — AB (ref 15–41)
Albumin: 2.2 g/dL — ABNORMAL LOW (ref 3.5–5.0)
Alkaline Phosphatase: 50 U/L (ref 38–126)
Anion gap: 9 (ref 5–15)
BUN: 31 mg/dL — AB (ref 6–20)
CHLORIDE: 105 mmol/L (ref 101–111)
CO2: 24 mmol/L (ref 22–32)
CREATININE: 1.71 mg/dL — AB (ref 0.44–1.00)
Calcium: 7.7 mg/dL — ABNORMAL LOW (ref 8.9–10.3)
GFR calc Af Amer: 34 mL/min — ABNORMAL LOW (ref >60)
GFR calc non Af Amer: 29 mL/min — ABNORMAL LOW (ref >60)
GLUCOSE: 95 mg/dL (ref 65–99)
Potassium: 3.5 mmol/L (ref 3.5–5.1)
SODIUM: 138 mmol/L (ref 135–145)
Total Bilirubin: 0.5 mg/dL (ref 0.3–1.2)
Total Protein: 5.2 g/dL — ABNORMAL LOW (ref 6.5–8.1)

## 2017-02-17 LAB — MAGNESIUM: MAGNESIUM: 1.8 mg/dL (ref 1.7–2.4)

## 2017-02-17 LAB — CBC
HCT: 25.6 % — ABNORMAL LOW (ref 36.0–46.0)
HEMOGLOBIN: 8.5 g/dL — AB (ref 12.0–15.0)
MCH: 28.7 pg (ref 26.0–34.0)
MCHC: 33.2 g/dL (ref 30.0–36.0)
MCV: 86.5 fL (ref 78.0–100.0)
PLATELETS: 109 10*3/uL — AB (ref 150–400)
RBC: 2.96 MIL/uL — AB (ref 3.87–5.11)
RDW: 17.1 % — ABNORMAL HIGH (ref 11.5–15.5)
WBC: 12.8 10*3/uL — AB (ref 4.0–10.5)

## 2017-02-17 LAB — PHOSPHORUS: Phosphorus: 3.5 mg/dL (ref 2.5–4.6)

## 2017-02-17 MED ORDER — TRACE MINERALS CR-CU-MN-SE-ZN 10-1000-500-60 MCG/ML IV SOLN
INTRAVENOUS | Status: AC
  Administered 2017-02-17: 21:00:00 via INTRAVENOUS
  Filled 2017-02-17: qty 840

## 2017-02-17 MED ORDER — PANTOPRAZOLE SODIUM 40 MG IV SOLR
40.0000 mg | INTRAVENOUS | Status: DC
  Administered 2017-02-17 – 2017-02-23 (×5): 40 mg via INTRAVENOUS
  Filled 2017-02-17 (×6): qty 40

## 2017-02-17 MED ORDER — FAT EMULSION 20 % IV EMUL
240.0000 mL | INTRAVENOUS | Status: DC
  Administered 2017-02-17: 240 mL via INTRAVENOUS
  Filled 2017-02-17: qty 250

## 2017-02-17 MED ORDER — SODIUM CHLORIDE 0.9% FLUSH
10.0000 mL | INTRAVENOUS | Status: DC | PRN
  Administered 2017-02-17: 20 mL
  Filled 2017-02-17: qty 40

## 2017-02-17 MED ORDER — LEVOTHYROXINE SODIUM 100 MCG IV SOLR
25.0000 ug | Freq: Every day | INTRAVENOUS | Status: DC
  Administered 2017-02-19 – 2017-02-23 (×5): 25 ug via INTRAVENOUS
  Filled 2017-02-17 (×6): qty 5

## 2017-02-17 MED ORDER — INSULIN ASPART 100 UNIT/ML ~~LOC~~ SOLN
0.0000 [IU] | Freq: Four times a day (QID) | SUBCUTANEOUS | Status: DC
  Administered 2017-02-18 (×3): 1 [IU] via SUBCUTANEOUS
  Administered 2017-02-19 – 2017-02-20 (×5): 2 [IU] via SUBCUTANEOUS

## 2017-02-17 MED ORDER — SODIUM CHLORIDE 0.9 % IV SOLN
INTRAVENOUS | Status: DC
  Administered 2017-02-17 – 2017-02-18 (×2): via INTRAVENOUS

## 2017-02-17 MED ORDER — ENOXAPARIN SODIUM 100 MG/ML ~~LOC~~ SOLN
90.0000 mg | Freq: Two times a day (BID) | SUBCUTANEOUS | Status: DC
  Administered 2017-02-17 – 2017-02-18 (×3): 90 mg via SUBCUTANEOUS
  Filled 2017-02-17 (×3): qty 1

## 2017-02-17 NOTE — Progress Notes (Signed)
Hem-Onc I discussed the case with Dr. Karleen Hampshire and Dr. Constance Holster (ENT) Patient has two options for the Tracheo-esophageal fistula 1.  Patient could be transferred to a tertiary center like [facility] or [place] or [facility] for immediate surgical options 2. second option could be where she could be treated with anticoagulation for the blood clot for the next 2-3 months.  Nutrition can be improved with the help of the PEG tube.  Subsequently referred for surgical correction to a tertiary hospital  Dr. Constance Holster will evaluate the patient tomorrow and advise [place] on the best course of action. I discussed these options with the patient and her husband in detail. They are leaning towards option #2. We will await Dr. Janeice Robinson recommendations.

## 2017-02-17 NOTE — Progress Notes (Signed)
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 V/Q scan showed findings consistent with acute PE. LE doppler neg for DVT. 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 Patient is likely not a candidate for endoscopic stent placement.  Plan is to consult CT surgery for recommendations. Per Dr. Karleen Hampshire, Dr. Lindi Adie recommends to start TPN for patient while she's NPO.   Significant events:   Today:   Glucose: 95   Electrolytes: K 3.5,  CorrCa 9.14  Renal: scr down 1.71 (crcl~33);  LFTs: AST/ALT trending down 90/160, Tbili wnl  TGs: pending  Prealbumin: pending  NUTRITIONAL GOALS                                                                                             RD recs: pending   Est protein requirement = 82 - 101 gm; kcal= 1575 -1890  Clinimix E 5/15 at a goal rate of 70 ml/hr + 20% fat emulsion at 20 ml/hr (for 12 hrs) to provide: 84 g/day protein, 1672 Kcal/day.  PLAN                                                                                                                         At 1800 today:  Start Clinimix E 5/15 at 40 ml/hr.  20% fat emulsion at 20 ml/hr for 12 hrs.  Plan to advance as tolerated to the goal rate.  TPN to contain standard multivitamins and trace elements.  Add sensitive SSI  q6h   TPN lab panels on  Mondays & Thursdays.  F/u daily.  Joslyne Marshburn P 02/17/2017,6:07 PM

## 2017-02-17 NOTE — Progress Notes (Signed)
Bilateral lower extremity venous duplex has been completed. Preliminary results can be found in chart review -> CV Proc  02/17/17 1:19 PM Carlos Levering RVT

## 2017-02-17 NOTE — Consult Note (Signed)
Referring Provider: Dr. Karleen Hampshire Primary Care Physician:  Kristen Lor, MD Primary Gastroenterologist:  Dr. Havery Moros  Reason for Consultation:  Tracheoesophageal fistula  HPI: Kristen Escobar is a 71 y.o. female with medical history significant for high-grade large cell lymphoma diagnosed by biopsy with severe lymphadenopathy in the neck with involvement of the esophagus status post two treatments with R CHOP last 02/06/2017.  She relates she has been experiencing coughing/gagging/choking when eating or drinking, which has affected her ability to take adequate PO intake. Friday she started having fever of 104 and she has been having a productive cough of yellow sputum for the last several days.  She was seen at the oncologist office on 10/29 and she was found to have a saturation of 87%.  Was tachycardic at 114 and a blood pressure 118/48 she was placed into a nasal cannula and her saturations improved to 95%.  D-dimer was obtained that was 10. CBC was done that showed a white count of 13 with an ANC of 11.8.  Hemoglobin was 11.0 grams with an MCV of 86.  Patient was directly admitted to the hospitalist service.  VQ scan was positive for PE.  She has been started on lovenox 90 mg SQ every 12 hours.  Patient was evaluated by speech pathology and MBSS was performed.  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  PO barium liquid freely passed through the fistula and into the airway with each swallow.   Past Medical History:  Diagnosis Date  . Arthritis   . Cancer ([place]) 03/2009   breast- rt  . GERD (gastroesophageal reflux disease)   . History of radiation therapy 07/12/10,completed   right breast 60 Gy x30 fx  . Hypertension   . Hypothyroidism   . Obesity   . Peripheral vascular disease ([place]) 1995   PT DEVELOPED CIRCULATION PROBLEMS IN BOTH HANDS AND  GANGRENE OF BOTH INDEX FINGERS--REQUIRING AMPUTATION OF THE INDEX FINGERS AND VASCULAR SURGERY.  PT TOLD HER PROBLEMS RELATED TO SMOKING.   NO OTHER PROBLEMS SINCE.  Marland Kitchen Pneumonia   . Thyroid mass     Past Surgical History:  Procedure Laterality Date  . AMPUTATION     partial amputation of both index fingers  . BREAST SURGERY  2011   lumpectomy with node sampling- RIGHT  . COLONOSCOPY    . ESOPHAGOGASTRODUODENOSCOPY    . EXCISION MASS NECK Left 12/26/2016   Procedure: EXCISION MASS NECK;  Surgeon: Izora Gala, MD;  Location: [facility];  Service: ENT;  Laterality: Left;  open excision of thyroid mass left side with frozen section  . HAND SURGERY Bilateral 1995   Amputaed pointer fingers bilaterally  . IR FLUORO GUIDE PORT INSERTION RIGHT  01/14/2017  . IR US GUIDE VASC ACCESS RIGHT  01/14/2017  . TOTAL HIP ARTHROPLASTY  12/16/2011   right hip  . TOTAL HIP ARTHROPLASTY  01/20/2012   Procedure: TOTAL HIP ARTHROPLASTY ANTERIOR APPROACH;  Surgeon: Mauri Pole, MD;  Location: [facility];  Service: Orthopedics;  Laterality: Left;  . TUBAL LIGATION    . VASCULAR SURGERY     both hands    Prior to Admission medications   Medication Sig Start Date End Date Taking? Authorizing Provider  allopurinol (ZYLOPRIM) 300 MG tablet Take 1 tablet (300 mg total) by mouth daily. 01/01/17  Yes Nicholas Lose, MD  atorvastatin (LIPITOR) 10 MG tablet TAKE 1 TABLET  EVERY DAY 10/06/16  Yes Kristen Lor, MD  chlorpheniramine-HYDROcodone (TUSSIONEX) 10-8 MG/5ML SUER Take 5 mLs by mouth every 12 (twelve) hours as needed for cough. 02/11/17  Yes Tanner, Lyndon Code., PA-C  HYDROcodone-acetaminophen (NORCO) 7.5-325 MG tablet Take 1 tablet by mouth every 6 (six) hours as needed for moderate pain. 12/26/16  Yes Izora Gala, MD  levothyroxine (SYNTHROID, LEVOTHROID) 50 MCG tablet TAKE 1 TABLET BY MOUTH DAILY 09/08/16  Yes Kristen Lor, MD  losartan-hydrochlorothiazide [facility]) 100-25 MG tablet TAKE 1 TABLET BY MOUTH EVERY  DAY 12/25/16  Yes Kristen Lor, MD  omeprazole (PRILOSEC) 20 MG capsule TAKE 1 CAPSULE BY MOUTH EVERY DAY 12/24/16  Yes Kristen Lor, MD  ondansetron (ZOFRAN) 8 MG tablet Take 1 tablet (8 mg total) by mouth 2 (two) times daily as needed for refractory nausea / vomiting. Start on day 3 after cyclophosphamide chemotherapy. 01/01/17  Yes Nicholas Lose, MD  predniSONE (DELTASONE) 20 MG tablet Take 3 tablets (60 mg total) by mouth daily. Take on days 1-5 of chemotherapy. 01/01/17  Yes Nicholas Lose, MD  sucralfate (CARAFATE) 1 GM/10ML suspension Take 10 mLs (1 g total) by mouth 4 (four) times daily -  with meals and at bedtime. 12/12/16  Yes Armbruster, Carlota Raspberry, MD  lidocaine (XYLOCAINE) 2 % solution Use as directed 20 mLs in the mouth or throat every 3 (three) hours as needed for mouth pain. Patient not taking: Reported on 02/16/2017 12/26/16   Izora Gala, MD  lidocaine-prilocaine (EMLA) cream Apply to affected area once Patient not taking: Reported on 02/16/2017 01/01/17   Nicholas Lose, MD  LORazepam (ATIVAN) 0.5 MG tablet Take 1 tablet (0.5 mg total) by mouth every 6 (six) hours as needed (Nausea or vomiting). 01/01/17   Nicholas Lose, MD  magic mouthwash w/lidocaine SOLN Take 5 mLs by mouth 3 (three) times daily as needed for mouth pain. Patient not taking: Reported on 02/16/2017 01/16/17   Nicholas Lose, MD  prochlorperazine (COMPAZINE) 10 MG tablet Take 1 tablet (10 mg total) by mouth every 6 (six) hours as needed (Nausea or vomiting). 01/01/17   Nicholas Lose, MD  promethazine (PHENERGAN) 25 MG suppository Place 1 suppository (25 mg total) rectally every 6 (six) hours as needed for nausea or vomiting. Patient not taking: Reported on 01/20/2017 12/26/16   Izora Gala, MD    Current Facility-Administered Medications  Medication Dose Route Frequency Provider Last Rate Last Dose  . 0.9 %  sodium chloride infusion   Intravenous Continuous Charlynne Cousins, MD 100 mL/hr at 02/17/17 [phone]    .  allopurinol (ZYLOPRIM) tablet 300 mg  300 mg Oral Daily Charlynne Cousins, MD   300 mg at 02/17/17 5188  . ampicillin-sulbactam (UNASYN) 1.5 g in sodium chloride 0.9 % 50 mL IVPB  1.5 g Intravenous Q8H Charlynne Cousins, MD   Stopped at 02/17/17 1213  . atorvastatin (LIPITOR) tablet 10 mg  10 mg Oral Daily Charlynne Cousins, MD   10 mg at 02/17/17 0811  . enoxaparin (LOVENOX) injection 90 mg  90 mg Subcutaneous Q12H Dara Hoyer, RPH   90 mg at 02/17/17 1143  . hydrALAZINE (APRESOLINE) injection 5 mg  5 mg Intravenous Q6H PRN Charlynne Cousins, MD      . HYDROcodone-acetaminophen [facility]) 7.5-325 MG per tablet 1 tablet  1 tablet Oral Q6H PRN Charlynne Cousins, MD      . levothyroxine (SYNTHROID, LEVOTHROID) tablet 50 mcg  50 mcg Oral QAC breakfast Aileen Fass,  Tammi Klippel, MD   50 mcg at 02/17/17 [phone]  . lidocaine (XYLOCAINE) 2 % viscous mouth solution 20 mL  20 mL Mouth/Throat Q3H PRN Charlynne Cousins, MD      . LORazepam (ATIVAN) tablet 0.5 mg  0.5 mg Oral Q6H PRN Charlynne Cousins, MD      . ondansetron [facility]) tablet 8 mg  8 mg Oral BID PRN Charlynne Cousins, MD      . pantoprazole (PROTONIX) EC tablet 40 mg  40 mg Oral Daily Charlynne Cousins, MD   40 mg at 02/17/17 0810  . polyethylene glycol (MIRALAX / GLYCOLAX) packet 17 g  17 g Oral Daily PRN Charlynne Cousins, MD      . promethazine (PHENERGAN) suppository 25 mg  25 mg Rectal Q6H PRN Charlynne Cousins, MD      . sodium chloride flush (NS) 0.9 % injection 10-40 mL  10-40 mL Intracatheter PRN Hosie Poisson, MD   20 mL at 02/17/17 0706  . sucralfate (CARAFATE) 1 GM/10ML suspension 1 g  1 g Oral TID WC & HS Charlynne Cousins, MD   1 g at 02/17/17 0810    Allergies as of 02/16/2017  . (No Known Allergies)    Family History  Problem Relation Age of Onset  . Cancer Mother        pt unaware of what kind  . Hearing loss Mother   . Coronary artery disease Father   . Diabetes Father   . Coronary artery  disease Brother   . Coronary artery disease Brother   . Diabetes Sister   . Cancer Sister        breast  . Colon cancer Neg Hx   . Stomach cancer Neg Hx   . Rectal cancer Neg Hx   . Esophageal cancer Neg Hx     Social History   Social History  . Marital status: Married    Spouse name: N/A  . Number of children: N/A  . Years of education: N/A   Occupational History  . Not on file.   Social History Main Topics  . Smoking status: Former Smoker    Packs/day: 1.00    Years: 20.00    Quit date: 04/21/1993  . Smokeless tobacco: Never Used  . Alcohol use Yes     Comment: OCCAS - MAYBE ONCE A MONTH  . Drug use: No  . Sexual activity: Yes   Other Topics Concern  . Not on file   Social History Narrative  . No narrative on file    Review of Systems: ROS is negative except as mentioned in HPI.  Physical Exam: Vital signs in last 24 hours: Temp:  [97.5 F (36.4 C)-98.6 F (37 C)] 98.6 F (37 C) (10/30 1343) Pulse Rate:  [91-97] 91 (10/30 1343) Resp:  [20-22] 20 (10/30 1343) BP: (114-168)/(44-58) 168/56 (10/30 1343) SpO2:  [93 %-97 %] 96 % (10/30 1343) Last BM Date: 02/14/17 General:  Alert, Well-developed, well-nourished, pleasant and cooperative in NAD Head:  Normocephalic and atraumatic. Eyes:  Sclera clear, no icterus.  Conjunctiva pink. Ears:  Normal auditory acuity. Mouth:  No deformity or lesions.   Lungs:  Clear throughout to auscultation.   No wheezes, crackles, or rhonchi.  Heart:  Regular rate and rhythm; no murmurs, clicks, rubs,  or gallops. Abdomen:  Soft, non-distended.  BS present.  Non-tender. Rectal:  Deferred.  Msk:  Symmetrical without gross deformities. Pulses:  Normal pulses noted. Extremities:  Without clubbing or  edema. Neurologic:  Alert and oriented x 4;  grossly normal neurologically. Skin:  Intact without significant lesions or rashes. Psych:  Alert and cooperative. Normal mood and affect.  Intake/Output from previous day: 10/29 0701 -  10/30 0700 In: 685 [I.V.:635; IV Piggyback:50] Out: -  Intake/Output this shift: Total I/O In: 350 [I.V.:300; IV Piggyback:50] Out: -   Lab Results:  Recent Labs  02/16/17 1056 02/17/17 0700  WBC 13.2* 12.8*  HGB 11.0* 8.5*  HCT 33.6* 25.6*  PLT 126* 109*   BMET  Recent Labs  02/16/17 1056 02/17/17 0700  NA 134* 138  K 4.0 3.5  CL  --  105  CO2 22 24  GLUCOSE 245* 95  BUN 41.3* 31*  CREATININE 2.6* 1.71*  CALCIUM 9.2 7.7*   LFT  Recent Labs  02/17/17 0700  PROT 5.2*  ALBUMIN 2.2*  AST 90*  ALT 160*  ALKPHOS 50  BILITOT 0.5   Studies/Results: Dg Chest 2 View  Result Date: 02/16/2017 CLINICAL DATA:  Dyspnea on exertion. Hypoxia. Progressive shortness breath for 2 days. Esophageal cancer. EXAM: CHEST  2 VIEW COMPARISON:  Chest film associated with Port-A-Cath insertion 01/14/2017. PET scan 12/08/2016. FINDINGS: Heart size is normal. Lung volumes are low. There is no edema or effusion. No focal airspace disease present. Aortic atherosclerosis is noted. A right IJ Port-A-Cath is stable. Surgical clips are noted in the right breast and axilla. The upper abdomen is unremarkable. IMPRESSION: 1. No acute cardiopulmonary disease. 2. Low lung volumes. Electronically Signed   By: San Morelle M.D.   On: 02/16/2017 13:05   Nm Pulmonary Perf And Vent  Addendum Date: 02/16/2017   ADDENDUM REPORT: 02/16/2017 20:22 ADDENDUM: Initial attempts to contact Hospitalists by pager at [place]. Ultimately, Critical Value/emergent results were called by telephone at the time of interpretation on 02/16/2017 at 8:20 pm to Nurse [facility], who verbally acknowledged these results. Electronically Signed   By: Suzy Bouchard M.D.   On: 02/16/2017 20:22   Result Date: 02/16/2017 CLINICAL DATA:  Elevated D-dimer. Esophageal carcinoma. Elevated white count. Short of breath. EXAM: NUCLEAR MEDICINE VENTILATION - PERFUSION LUNG SCAN TECHNIQUE: Ventilation images were obtained in multiple  projections using inhaled aerosol Tc-22m DTPA. Perfusion images were obtained in multiple projections after intravenous injection of Tc-4m MAA. RADIOPHARMACEUTICALS:  28.8 mCi Technetium-37m DTPA aerosol inhalation and 4.1 mCi Technetium-57m MAA IV COMPARISON:  Chest radiograph 02/16/2017 FINDINGS: Ventilation: Informed ventilation per Perfusion: There are subtle wedge-shaped perfusion defects in the LEFT RIGHT upper lobe. Smaller defects at the LEFT and RIGHT lung base posteriorly. These peripheral perfusion defects are unmatched to ventilation. IMPRESSION: Bilateral un matched wedge-shaped peripheral perfusion defects most consistent with acute pulmonary embolism. Electronically Signed: By: Suzy Bouchard M.D. On: 02/16/2017 19:50   Dg Swallowing Func-speech Pathology  Result Date: 02/17/2017 CLINICAL DATA:  71 year old female diagnosed with Lymphoma including a mass at the left tracheoesophageal groove near the thyroid. Multiple imaging guided biopsies were performed, but ultimately a surgical biopsy of the mass was necessary on 12/26/2016 for diagnosis. The patient is now status post chemotherapy, with an unexplained cough, coughing with p.o. intake. EXAM: MODIFIED BARIUM SWALLOW TECHNIQUE: Different consistencies of barium were administered orally to the patient by the Speech Pathologist. Imaging of the pharynx was performed in the lateral projection. FLUOROSCOPY TIME:  Fluoroscopy Time:  0 minutes 54 seconds Radiation Exposure Index (if provided by the fluoroscopic device): 1.8mG y Number of Acquired Spot Images: 0 COMPARISON:  Neck CT 11/21/2016. FINDINGS: Pensions consultant  thick liquid- the patient is able to swallow liquid into the cervical esophagus, but with each swallow there is barium extension from the cervical esophagus (C5/C6 level) through the posterior wall of the trachea into the tracheal lumen. This occurs about 18 mm below the level of the glottis (series 5, image 4). 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 PO barium liquid freely passed through the fistula and into the airway with each swallow. Study discussed by telephone with Dr. Hosie Poisson on 02/17/2017 at 1245 hours. Please refer to the Speech Pathologists report for complete details and recommendations. Electronically Signed   By: Genevie Ann M.D.   On: 02/17/2017 13:28   IMPRESSION:  *Dysphagia with aspiration: 71 year old female with diffuse large B-cell lymphoma who is undergoing chemo, finished two of 6 treatments.  Having significant amount of coughing/choking/gagging with eating/drinking that has limited her PO intake.  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  Also found to have a PE. *PE:  On lovenox 90 mg SQ every 12 hours.  PLAN: *Further recommendations per Dr. Loletha Carrow.  Usually in the case of a tracheoesophageal fistula these can be stented.  Hers seems to be quite high, ? If stenting is an option in this case.     ZEHR, JESSICA D.  02/17/2017, 2:18 PM  Pager number [phone]   I have reviewed the entire case in detail with the above APP and discussed the plan in detail.  Therefore, I agree with the diagnoses recorded above. In addition,  I have personally interviewed and examined the patient and have personally reviewed any abdominal/pelvic CT scan images.  My additional thoughts are as follows:  This is a complex and challenging situation.  I had a long discussion with the patient and her husband and son.  The husband is disappointed and upset that a stent is not feasible, and that we do not yet have a clear plan.   I am afraid this fistula is not amenable to endoscopic stent placement due to its location. It therefore requires surgical intervention, either by CT surgery or ENT or both , depending upon the availability and expertise of our surgeons.  They may want to send her out to an academic  center.   It has been suggested to the patient and family (?by radiology or SLP?) that a feeding tube should be considered.  She does appear to need it, but the timing and technique  depend upon the surgical plan, especially considering + V/Q scan and anticoagulation.   So I recommend starting with CT surgical consultation and discuss options with patient and husband.  I conveyed this to Dr. Karleen Hampshire, and she agrees to place the consult.   Nelida Meuse III Pager [phone]  Mon-Fri 8a-5p [phone] after 5p, weekends, holidays

## 2017-02-17 NOTE — Procedures (Signed)
Objective Swallowing Evaluation: Type of Study: MBS-Modified Barium Swallow Study  Patient Details  Name: Kristen Escobar MRN: [id number] Date of Birth: [birth year]  Today's Date: 02/17/2017 Time: SLP Start Time (ACUTE ONLY): 1206-SLP Stop Time (ACUTE ONLY): 1230 SLP Time Calculation (min) (ACUTE ONLY): 24 min  Past Medical History:  Past Medical History:  Diagnosis Date  . Arthritis   . Cancer ([place]) 03/2009   breast- rt  . GERD (gastroesophageal reflux disease)   . History of radiation therapy 07/12/10,completed   right breast 60 Gy x30 fx  . Hypertension   . Hypothyroidism   . Obesity   . Peripheral vascular disease ([place]) 1995   PT DEVELOPED CIRCULATION PROBLEMS IN BOTH HANDS AND GANGRENE OF BOTH INDEX FINGERS--REQUIRING AMPUTATION OF THE INDEX FINGERS AND VASCULAR SURGERY.  PT TOLD HER PROBLEMS RELATED TO SMOKING.   NO OTHER PROBLEMS SINCE.  Marland Kitchen Pneumonia   . Thyroid mass    Past Surgical History:  Past Surgical History:  Procedure Laterality Date  . AMPUTATION     partial amputation of both index fingers  . BREAST SURGERY  2011   lumpectomy with node sampling- RIGHT  . COLONOSCOPY    . ESOPHAGOGASTRODUODENOSCOPY    . EXCISION MASS NECK Left 12/26/2016   Procedure: EXCISION MASS NECK;  Surgeon: Izora Gala, MD;  Location: [facility];  Service: ENT;  Laterality: Left;  open excision of thyroid mass left side with frozen section  . HAND SURGERY Bilateral 1995   Amputaed pointer fingers bilaterally  . IR FLUORO GUIDE PORT INSERTION RIGHT  01/14/2017  . IR US GUIDE VASC ACCESS RIGHT  01/14/2017  . TOTAL HIP ARTHROPLASTY  12/16/2011   right hip  . TOTAL HIP ARTHROPLASTY  01/20/2012   Procedure: TOTAL HIP ARTHROPLASTY ANTERIOR APPROACH;  Surgeon: Mauri Pole, MD;  Location: [facility];  Service: Orthopedics;  Laterality: Left;  . TUBAL LIGATION    . VASCULAR SURGERY     both hands   HPI: 71 yo female adm to [facility] from cancer center due to respiratory difficulties= found to have a pulmonary  embolism.  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  She is s/p neck mass excision on left by Dr Constance Holster 12/26/2016.  Hoarsesness noted by pt in May 2018 and odynophagia since July 2018.  Pt reports chemo started Sept has eliminated odynophagia but has caused her to cough/choke with all intake - weight loss reported by patient.  Per spouse, pt's esophagus ulcerated signficantly per endoscopy.  Pt is consuming liquids primarily as she reports she can't swallow solids.  Very supportive family present (daughter Maudie Mercury and pt's spouse).  Pt also with h/o breast cancer s/p XRT done 07/12/2010.  Pt with excessive secretions after chemo per family *coughing up white to yellowish secretions in the middle of the night.  Per family, MD hopes for improved swallowing with tumor shrinkage half way through treatment a (six rounds planned) and pt has been encouraged to consume all po possible).  First treatment was approximately one month ago.  Per family, it pt's swallowing does not improve, oncology plans to refer to Dr Constance Holster again.  Swallow evaluation ordered due to aspiration concerns.    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   Approximately 1/3 of boluses were aspirated once entered into  esophagus.   SLP only provided 3 tsps of nectar thick barium due to overt aspiration.  With aspiration, pt does cough and expelled boluses into oral cavity/pharynx and re-swallowed but pt did not fully clear aspirates.  SLP alerted radiologist and he confirmed fistula.  Educated family extensively to results of test, importance of oral care due to secretion aspiration. Advised pt at least swish and expectorate with water to facilitate oral care.  Question if MD will allow  pt to have single ice chips after oral care to decrease disuse muscle atrophy with lack of ability to consume po.    SLP Visit Diagnosis Dysphagia, pharyngeal-esophageal phase (R13.13)  Attention and concentration deficit following --  Frontal lobe and executive function deficit following --  Impact on safety and function Severe aspiration risk;Risk for inadequate nutrition/hydration      CHL IP TREATMENT RECOMMENDATION 02/17/2017  Treatment Recommendations Defer until completion of intrumental exam     Prognosis 02/17/2017  Prognosis for Safe Diet Advancement Guarded  Barriers to Reach Goals --  Barriers/Prognosis Comment --    CHL IP DIET RECOMMENDATION 02/17/2017  SLP Diet Recommendations NPO;Ice chips PRN after oral care  Liquid Administration via Spoon  Medication Administration Via alternative means  Compensations --  Postural Changes --      CHL IP OTHER RECOMMENDATIONS 02/17/2017  Recommended Consults Consider ENT evaluation  Oral Care Recommendations --  Other Recommendations --      CHL IP FOLLOW UP RECOMMENDATIONS 02/17/2017  Follow up Recommendations None      No flowsheet data found.         CHL IP ORAL PHASE 02/17/2017  Oral Phase WFL  Oral - Pudding Teaspoon --  Oral - Pudding Cup --  Oral - Honey Teaspoon --  Oral - Honey Cup --  Oral - Nectar Teaspoon WFL  Oral - Nectar Cup --  Oral - Nectar Straw --  Oral - Thin Teaspoon --  Oral - Thin Cup --  Oral - Thin Straw --  Oral - Puree --  Oral - Mech Soft --  Oral - Regular --  Oral - Multi-Consistency --  Oral - Pill --  Oral Phase - Comment --    CHL IP PHARYNGEAL PHASE 02/17/2017  Pharyngeal Phase WFL  Pharyngeal- Pudding Teaspoon --  Pharyngeal --  Pharyngeal- Pudding Cup --  Pharyngeal --  Pharyngeal- Honey Teaspoon --  Pharyngeal --  Pharyngeal- Honey Cup --  Pharyngeal --  Pharyngeal- Nectar Teaspoon --  Pharyngeal --  Pharyngeal- Nectar Cup --  Pharyngeal --  Pharyngeal-  Nectar Straw --  Pharyngeal --  Pharyngeal- Thin Teaspoon --  Pharyngeal --  Pharyngeal- Thin Cup --  Pharyngeal --  Pharyngeal- Thin Straw --  Pharyngeal --  Pharyngeal- Puree --  Pharyngeal --  Pharyngeal- Mechanical Soft --  Pharyngeal --  Pharyngeal- Regular --  Pharyngeal --  Pharyngeal- Multi-consistency --  Pharyngeal --  Pharyngeal- Pill --  Pharyngeal --  Pharyngeal Comment --     CHL IP CERVICAL ESOPHAGEAL PHASE 02/17/2017  Cervical Esophageal Phase Impaired  Pudding Teaspoon --  Pudding Cup --  Honey Teaspoon --  Honey Cup --  Nectar Teaspoon Prominent cricopharyngeal segment;Esophageal backflow into cervical esophagus;Other (Comment)  Nectar Cup --  Nectar Straw --  Thin Teaspoon --  Thin Cup --  Thin Straw --  Puree --  Mechanical Soft --  Regular --  Multi-consistency --  Pill --  Cervical Esophageal Comment appearance of fistula of esophagus with barium and  secretions spilling into trachea with pt coughing and re-swallowing    No flowsheet data found.  Macario Golds 02/17/2017, 5:29 PM    Luanna Salk, [facility] SLP [phone]

## 2017-02-17 NOTE — Evaluation (Addendum)
Physical Therapy Evaluation Patient Details Name: Kristen Escobar MRN: [id number] DOB: May 05, 1945 Today's Date: 02/17/2017     SATURATION QUALIFICATIONS: (This note is used to comply with regulatory documentation for home oxygen)  Patient Saturations on Room Air at Rest = 90%  Patient Saturations on Room Air while Ambulating = N/A  Patient Saturations on 2 Liters of oxygen while Ambulating = 87-88%      History of Present Illness  71 yo female admitted with acute PE. Hx of lymphoma, PVD, breast cancer, obesity, hypothyroidism  Clinical Impression  On eval, pt was Min guard assist for mobility. She walked ~115 feet while holding on to the IV pole for support. Dyspnea 3/4 with activity. Remained on [place] O2 for ambulation. Pt fatigues fairly easily. Discussed d/c plan-pt plans to return home with family assisting as needed. Pt politely declines HHPT f/u at this time. Will follow and progress activity as tolerated. Encouraged pt to use her RW as needed for ambulation safety.     Follow Up Recommendations No PT follow up (pt declined HHPT f/u at time of PT eval)    Equipment Recommendations  None recommended by PT    Recommendations for Other Services       Precautions / Restrictions Precautions Precautions: Fall Precaution Comments: monitor O2 sat Restrictions Weight Bearing Restrictions: No      Mobility  Bed Mobility Overal bed mobility: Modified Independent                Transfers Overall transfer level: Needs assistance   Transfers: Sit to/from Stand Sit to Stand: Supervision         General transfer comment: for safety  Ambulation/Gait Ambulation/Gait assistance: Min guard Ambulation Distance (Feet): 115 Feet Assistive device:  (IV pole) Gait Pattern/deviations: Step-through pattern;Decreased stride length     General Gait Details: mildly unsteady. Pt did rely on IV pole for some support. Dyspnea 3/4. O2 sat 87-88% on 2L [place] O2.   Stairs             Wheelchair Mobility    Modified Rankin (Stroke Patients Only)       Balance                                             Pertinent Vitals/Pain Pain Assessment: No/denies pain Pain Score: 0-No pain    Home Living Family/patient expects to be discharged to:: Private residence Living Arrangements: Spouse/significant other Available Help at Discharge: Family;Available 24 hours/day Type of Home: House Home Access: Stairs to enter Entrance Stairs-Rails: Psychiatric nurse of Steps: 4 Home Layout: One level Home Equipment: Walker - 2 wheels;Bedside commode      Prior Function Level of Independence: Independent               Hand Dominance        Extremity/Trunk Assessment   Upper Extremity Assessment Upper Extremity Assessment: Generalized weakness    Lower Extremity Assessment Lower Extremity Assessment: Generalized weakness    Cervical / Trunk Assessment Cervical / Trunk Assessment: Normal  Communication   Communication: No difficulties  Cognition Arousal/Alertness: Awake/alert Behavior During Therapy: WFL for tasks assessed/performed Overall Cognitive Status: Within Functional Limits for tasks assessed  General Comments      Exercises     Assessment/Plan    PT Assessment Patient needs continued PT services  PT Problem List Decreased strength;Decreased mobility;Decreased activity tolerance;Decreased balance;Decreased knowledge of use of DME       PT Treatment Interventions DME instruction;Therapeutic activities;Gait training;Therapeutic exercise;Patient/family education;Functional mobility training;Balance training    PT Goals (Current goals can be found in the Care Plan section)  Acute Rehab PT Goals Patient Stated Goal: to breathe better PT Goal Formulation: With patient Time For Goal Achievement: 03/03/17 Potential to Achieve Goals: Good     Frequency 7X/week   Barriers to discharge        Co-evaluation               AM-PAC PT "6 Clicks" Daily Activity  Outcome Measure Difficulty turning over in bed (including adjusting bedclothes, sheets and blankets)?: None Difficulty moving from lying on back to sitting on the side of the bed? : None Difficulty sitting down on and standing up from a chair with arms (e.g., wheelchair, bedside commode, etc,.)?: A Little Help needed moving to and from a bed to chair (including a wheelchair)?: A Little Help needed walking in hospital room?: A Little Help needed climbing 3-5 steps with a railing? : A Little 6 Click Score: 20    End of Session Equipment Utilized During Treatment: Oxygen Activity Tolerance: Patient limited by fatigue Patient left: in bed;with call bell/phone within reach;with family/visitor present   PT Visit Diagnosis: Muscle weakness (generalized) (M62.81);Difficulty in walking, not elsewhere classified (R26.2)    Time: 6962-9528 PT Time Calculation (min) (ACUTE ONLY): 18 min   Charges:   PT Evaluation $PT Eval Moderate Complexity: 1 Mod     PT G Codes:          Weston Anna, MPT Pager: [phone]

## 2017-02-17 NOTE — Progress Notes (Signed)
CRITICAL VALUE ALERT  Critical Value:  Pulmonary emboli  Date & Time Notied:  02/16/17@2017   Provider Notified: Yes  Orders Received/Actions taken: Yes

## 2017-02-17 NOTE — Evaluation (Signed)
Clinical/Bedside Swallow Evaluation Patient Details  Name: Kristen Escobar MRN: [id number] Date of Birth: 1945/11/12  Today's Date: 02/17/2017 Time: SLP Start Time (ACUTE ONLY): 0900 SLP Stop Time (ACUTE ONLY): 0932 SLP Time Calculation (min) (ACUTE ONLY): 32 min  Past Medical History:  Past Medical History:  Diagnosis Date  . Arthritis   . Cancer ([place]) 03/2009   breast- rt  . GERD (gastroesophageal reflux disease)   . History of radiation therapy 07/12/10,completed   right breast 60 Gy x30 fx  . Hypertension   . Hypothyroidism   . Obesity   . Peripheral vascular disease ([place]) 1995   PT DEVELOPED CIRCULATION PROBLEMS IN BOTH HANDS AND GANGRENE OF BOTH INDEX FINGERS--REQUIRING AMPUTATION OF THE INDEX FINGERS AND VASCULAR SURGERY.  PT TOLD HER PROBLEMS RELATED TO SMOKING.   NO OTHER PROBLEMS SINCE.  Marland Kitchen Pneumonia   . Thyroid mass    Past Surgical History:  Past Surgical History:  Procedure Laterality Date  . AMPUTATION     partial amputation of both index fingers  . BREAST SURGERY  2011   lumpectomy with node sampling- RIGHT  . COLONOSCOPY    . ESOPHAGOGASTRODUODENOSCOPY    . EXCISION MASS NECK Left 12/26/2016   Procedure: EXCISION MASS NECK;  Surgeon: Izora Gala, MD;  Location: [facility];  Service: ENT;  Laterality: Left;  open excision of thyroid mass left side with frozen section  . HAND SURGERY Bilateral 1995   Amputaed pointer fingers bilaterally  . IR FLUORO GUIDE PORT INSERTION RIGHT  01/14/2017  . IR US GUIDE VASC ACCESS RIGHT  01/14/2017  . TOTAL HIP ARTHROPLASTY  12/16/2011   right hip  . TOTAL HIP ARTHROPLASTY  01/20/2012   Procedure: TOTAL HIP ARTHROPLASTY ANTERIOR APPROACH;  Surgeon: Mauri Pole, MD;  Location[facility];  Service: Orthopedics;  Laterality: Left;  . TUBAL LIGATION    . VASCULAR SURGERY     both hands   HPI:  71 yo female adm to [facility] from cancer center due to respiratory difficulties= found to have a pulmonary embolism.  Pt has diagnosis of large cell  lymphoma with severe neck lymphadenopathy impacting thyroid and esophagus.  She is s/p neck mass excision on left by Dr Constance Holster 12/26/2016.  Hoarsesness noted by pt in May 2018 and odynophagia since July 2018.  Pt reports chemo started Sept has eliminated odynophagia but has caused her to cough/choke with all intake - weight loss reported by patient.  Per spouse, pt's esophagus ulcerated signficantly per endoscopy.  Pt is consuming liquids primarily as she reports she can't swallow solids.  Very supportive family present (daughter Maudie Mercury and pt's spouse).  Pt also with h/o breast cancer s/p XRT done 07/12/2010.  Pt with excessive secretions after chemo per family *coughing up white to yellowish secretions in the middle of the night.  Per family, MD hopes for improved swallowing with tumor shrinkage half way through treatment a (six rounds planned) and pt has been encouraged to consume all po possible).  First treatment was approximately one month ago.  Per family, it pt's swallowing does not improve, oncology plans to refer to Dr Constance Holster again.  Swallow evaluation ordered due to aspiration concerns.  Pt CXR negative for pna.    Assessment / Plan / Recommendation Clinical Impression  Multifactorial dysphagia suspected (pharyngeal and cervical esophageal) due to recurrent laryngeal nerve involvement.  Note per MD documentation - left recurrent nerve involved.  Dysphonia and overt indication of airway compromise with all po intake reported.  SlP  observed pt consume medication with thin water - overt significant coughing and multiple swallows apparent.   Pt did not appear concerned with occurence but admits it is uncomfortable.  CN exam negative except vocal hoarseness.   As a result of pt's dysphagia, she reports substantial weight loss.  MBS indicated to allow instrumental assessment to determine if postures, mitigation strategies may be helpful.  Pt, spouse and daughter Maudie Mercury agreeable and RN informed.   SLP Visit  Diagnosis: Dysphagia, pharyngeal phase (R13.13)    Aspiration Risk  Severe aspiration risk;Risk for inadequate nutrition/hydration    Diet Recommendation NPO except meds   Medication Administration:  (as tolerated, pt takes with yogurt at times)    Other  Recommendations     Follow up Recommendations   tbd     Frequency and Duration   tbd         Prognosis   guarded     Swallow Study   General Date of Onset: 02/17/17 HPI: 71 yo female adm to [facility] from cancer center due to respiratory difficulties= found to have a pulmonary embolism.  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  She is s/p neck mass excision on left by Dr Constance Holster 12/26/2016.  Hoarsesness noted by pt in May 2018 and odynophagia since July 2018.  Pt reports chemo started Sept has eliminated odynophagia but has caused her to cough/choke with all intake - weight loss reported by patient.  Per spouse, pt's esophagus ulcerated signficantly per endoscopy.  Pt is consuming liquids primarily as she reports she can't swallow solids.  Very supportive family present (daughter Maudie Mercury and pt's spouse).  Pt also with h/o breast cancer s/p XRT done 07/12/2010.  Pt with excessive secretions after chemo per family *coughing up white to yellowish secretions in the middle of the night.  Per family, MD hopes for improved swallowing with tumor shrinkage half way through treatment a (six rounds planned) and pt has been encouraged to consume all po possible).  First treatment was approximately one month ago.  Per family, it pt's swallowing does not improve, oncology plans to refer to Dr Constance Holster again.  Swallow evaluation ordered due to aspiration concerns.   Type of Study: MBS-Modified Barium Swallow Study Diet Prior to this Study: NPO Temperature Spikes Noted: No Respiratory Status: Room air History of Recent Intubation: No Behavior/Cognition: Alert;Cooperative;Pleasant mood Oral Cavity  Assessment: Other (comment);Erythema;Lesions (two lesion on soft palate) Oral Care Completed by SLP: No Oral Cavity - Dentition: Dentures, top;Dentures, bottom (pt reports loose due to weight loss) Vision: Functional for self-feeding Self-Feeding Abilities: Able to feed self Patient Positioning: Upright in bed Baseline Vocal Quality: Hoarse;Breathy;Low vocal intensity;Suspected CN X (Vagus) involvement Volitional Cough: Congested;Strong (mostly productive with intake per pt's spouse) Volitional Swallow: Able to elicit    Oral/Motor/Sensory Function Overall Oral Motor/Sensory Function: Generalized oral weakness   Ice Chips Ice chips: Not tested   Thin Liquid Thin Liquid: Impaired Presentation: Self Fed;Cup Pharyngeal  Phase Impairments: Cough - Immediate;Multiple swallows Other Comments: with pill    Nectar Thick Nectar Thick Liquid: Not tested Other Comments: pt reports Ensure is worse to swallow than water, thickness   Honey Thick Honey Thick Liquid: Not tested   Puree Puree: Not tested   Solid   GO   Solid: Not tested        Macario Golds 02/17/2017,9:54 AM  Luanna Salk, [place] SLP [phone]

## 2017-02-17 NOTE — Care Management Note (Signed)
Case Management Note  Patient Details  Name: Kristen Escobar MRN: [id number] Date of Birth: [birth year]  Subjective/Objective:71 y/o f admitted w/Respiratory failure, Hx: lymphoma. From home. Noted 02 sats 90%ra @ rest.                    Action/Plan:d/c plan home.   Expected Discharge Date:   (unknown)               Expected Discharge Plan:  Home/Self Care  In-House Referral:     Discharge planning Services  CM Consult  Post Acute Care Choice:    Choice offered to:     DME Arranged:    DME Agency:     HH Arranged:    HH Agency:     Status of Service:  In process, will continue to follow  If discussed at Long Length of Stay Meetings, dates discussed:    Additional Comments:  Dessa Phi, RN 02/17/2017, 2:22 PM

## 2017-02-17 NOTE — Progress Notes (Addendum)
Modified Barium Swallow Progress Note  Patient Details  Name: Kristen Escobar MRN: [id number] Date of Birth: 08-20-1945  Today's Date: 02/17/2017  Modified Barium Swallow completed.  Full report located under Chart Review in the Imaging Section.  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   Approximately 1/3 of boluses were aspirated once entered into esophagus.   SLP only provided 3 tsps of nectar thick barium due to overt aspiration.  With aspiration, pt does cough and expelled boluses into oral cavity/pharynx and re-swallowed but pt did not fully clear aspirates.    SLP alerted radiologist and he confirmed fistula (please see Radiologist note).  Educated family extensively to results of test, importance of oral care due to secretion aspiration.  Dr Nevada Crane advised family he would alert pt's hospitalist regarding results.  Advised pt at least swish and expectorate with water to facilitate oral care.  Suspect pt will benefit from alternative means of nutrition due to overt dysphagia with malnutrition and aspiration.  ENT referral recommended.    Question if MD will allow pt to have single ice chips after oral to decrease disuse muscle atrophy.     Swallow Evaluation Recommendations   Recommended Consults: Consider ENT evaluation   SLP Diet Recommendations: NPO;Ice chips PRN after oral care (ice chips if MD agrees after oral care)   Liquid Administration via: Spoon   Medication Administration: Via alternative means                      Kristen Escobar, [facility] SLP [phone]   Kristen Escobar 02/17/2017,1:57 PM

## 2017-02-17 NOTE — Progress Notes (Signed)
ANTICOAGULATION CONSULT NOTE - Follow Up Consult  Pharmacy Consult for Lovenox Indication: possible PE  No Known Allergies  Patient Measurements:   Documented height 63 inches, weight 93.6kg  Vital Signs: Temp: 98.4 F (36.9 C) (10/30 0432) Temp Source: Oral (10/30 0432) BP: 119/48 (10/30 0432) Pulse Rate: 91 (10/30 0432)  Labs:  Recent Labs  02/16/17 1056 02/16/17 1056 02/17/17 0700  HGB 11.0*  --  8.5*  HCT 33.6*  --  25.6*  PLT 126*  --  109*  CREATININE  --  2.6* 1.71*    Estimated Creatinine Clearance: 33.3 mL/min (A) (by C-G formula based on SCr of 1.71 mg/dL (H)).   Medical History: Past Medical History:  Diagnosis Date  . Arthritis   . Cancer ([place]) 03/2009   breast- rt  . GERD (gastroesophageal reflux disease)   . History of radiation therapy 07/12/10,completed   right breast 60 Gy x30 fx  . Hypertension   . Hypothyroidism   . Obesity   . Peripheral vascular disease ([place]) 1995   PT DEVELOPED CIRCULATION PROBLEMS IN BOTH HANDS AND GANGRENE OF BOTH INDEX FINGERS--REQUIRING AMPUTATION OF THE INDEX FINGERS AND VASCULAR SURGERY.  PT TOLD HER PROBLEMS RELATED TO SMOKING.   NO OTHER PROBLEMS SINCE.  Marland Kitchen Pneumonia   . Thyroid mass     Assessment: 51 yoF admitted on 10/29 with acute on chronic respiratory failure.  Possible PE with elevated D-dimer 10.3.   Pharmacy is consulted to dose Lovenox for possible PE.  VQ Scan impression most consistent with acute pulmonary embolism.   Most recent Lovenox 90 mg given at [facility] on 10/29 at 1514.  AKI on CKD on presentation with SCr 2.6 and CrCl ~ 22 ml/min so scheduled for Lovenox q24h to be started 10/30 at 1500.  This morning's lab work show improving SCr with CrCl>30 ml/min so will adjust to Lovenox 1 mg/kg q12h dosing. CBC: Hgb decreased to 8.5 following IVF replacement - will f/u if trends further off fluids. No bleeding reported. Plt low on admission likely due to chemotherapy administered on 10/19.  Goal of  Therapy:  Anti-Xa level 0.6-1 units/ml 4hrs after LMWH dose given Monitor platelets by anticoagulation protocol: Yes   Plan:   Lovenox 90 mg IV q12h, next dose due now.  Monitor SCr, CBC, bleeding  Hershal Coria, PharmD, BCPS Pager: [phone] 02/17/2017 8:59 AM

## 2017-02-17 NOTE — Progress Notes (Addendum)
MBS completed, full report to follow.  Please note Dr Nevada Crane, Radiologist note is pending.  Pt with appearance of esophageal-tracheal fistula resulting in copious aspiration of secretions and barium.  Pt only provided with 3 tsps of nectar liquids - no further trials due to aspiration. Paged Dr Karleen Hampshire and informed RN [name] Bellow of results.    Luanna Salk, [place] [facility] [facility] SLP [phone]

## 2017-02-17 NOTE — Progress Notes (Signed)
PROGRESS NOTE    Kristen Escobar  GDJ:[id number] DOB: 11/13/1945 DOA: 02/16/2017 PCP: Marletta Lor, MD    Brief Narrative: Kristen Escobar is a 71 y.o. 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  Since she was coughing with po intake, SLP eval was done and she underwent MBS showing a esophageal tracheal fistula.  GI consulted , recommended CT surgery evaluation. Unfortunately CT surgery said, she would benefit from transfer to a tertiary care surgery for surgical repair of the fistula. Updated Dr Lindi Adie.    Assessment & Plan:   Active Problems:   Essential hypertension   Chronic kidney disease   Diffuse large B cell lymphoma (HCC)   AKI (acute kidney injury) ([place])   Acute on chronic respiratory failure with hypoxia (HCC)   Acute respiratory failure with hypoxia (HCC)  Acute respiratory failure with hypoxia:  Secondary to pulmonary embolism and possibly aspiration pneumonitis.  lovenox for PE and IV unasyn for aspiration pneumonitis.  [place] oxygen to keep sats greater than 90%.  NPO for now.  Start TPN for nutrition as per Dr Geralyn Flash recommendations.     Diffuse large B CELL lymphoma:  S/p 2 cycles of chemo.  Pending 4 more cycles.  Outpatient follow up with Dr Lindi Adie.    AKI on stage 3 CKD:  IMPROVING, hydrate and repeat renal parameters in am.    Hypertension; better controlled.  Prn hydralazine.    Elevated liver function test;  Stop the lipitor.    Leukocytosis:  Infection  Vs neulasta inj   Anemia of chronic disease : Monitor hemoglobin.    Thrombocytopenia:  No signs of bleeding. Possibly from chemo. Monitor.        DVT prophylaxis: Lovenox.  Code Status: full  code.  Family Communication: family at bedside.  Disposition Plan: pending transfer to tertiary care.    Consultants:   Gastroenterology.   Discussed with CT surgery over the phone.   Updated Dr Lindi Adie over the phone.    Procedures: venous duplex.   MBS.   ECHO   Antimicrobials: unasyn.    Subjective: No new complaints.   Objective: Vitals:   02/16/17 2213 02/17/17 0432 02/17/17 1200 02/17/17 1343  BP: (!) 131/44 (!) 119/48  (!) 168/56  Pulse: 95 91  91  Resp: 20 20  20   Temp: 97.9 F (36.6 C) 98.4 F (36.9 C)  98.6 F (37 C)  TempSrc: Oral Oral  Oral  SpO2: 96% 94% 93% 96%    Intake/Output Summary (Last 24 hours) at 02/17/17 1851 Last data filed at 02/17/17 1800  Gross per 24 hour  Intake             1835 ml  Output                0 ml  Net             1835 ml   There were no vitals filed for this visit.  Examination:  General exam: Appears calm and comfortable  Respiratory system: Clear to auscultation. Respiratory effort normal. Cardiovascular system: S1 & S2 heard, RRR. No JVD, murmurs, rubs, gallops or clicks. No pedal edema. Gastrointestinal system: Abdomen is nondistended, soft and nontender. No organomegaly or masses felt. Normal bowel sounds  heard. Central nervous system: Alert and oriented. No focal neurological deficits. Extremities: Symmetric 5 x 5 power. Skin: No rashes, lesions or ulcers Psychiatry: Judgement and insight appear normal. Mood & affect appropriate.     Data Reviewed: I have personally reviewed following labs and imaging studies  CBC:  Recent Labs Lab 02/11/17 1048 02/16/17 1056 02/17/17 0700  WBC 6.7 13.2* 12.8*  NEUTROABS 6.0 11.8*  --   HGB 10.1* 11.0* 8.5*  HCT 30.9* 33.6* 25.6*  MCV 86.4 86.1 86.5  PLT 104* 126* 476*   Basic Metabolic Panel:  Recent Labs Lab 02/11/17 1048 02/16/17 1056 02/17/17 0700  NA 138 134* 138  K 3.8 4.0 3.5  CL  --   --  105  CO2 23 22 24   GLUCOSE 162* 245* 95  BUN 34.1*  41.3* 31*  CREATININE 1.1 2.6* 1.71*  CALCIUM 8.5 9.2 7.7*   GFR: Estimated Creatinine Clearance: 33.3 mL/min (A) (by C-G formula based on SCr of 1.71 mg/dL (H)). Liver Function Tests:  Recent Labs Lab 02/11/17 1048 02/16/17 1056 02/17/17 0700  AST 84* 91* 90*  ALT 449* 218* 160*  ALKPHOS 70 69 50  BILITOT 0.65 0.67 0.5  PROT 5.9* 6.8 5.2*  ALBUMIN 2.6* 2.6* 2.2*   No results for input(s): LIPASE, AMYLASE in the last 168 hours. No results for input(s): AMMONIA in the last 168 hours. Coagulation Profile: No results for input(s): INR, PROTIME in the last 168 hours. Cardiac Enzymes: No results for input(s): CKTOTAL, CKMB, CKMBINDEX, TROPONINI in the last 168 hours. BNP (last 3 results) No results for input(s): PROBNP in the last 8760 hours. HbA1C: No results for input(s): HGBA1C in the last 72 hours. CBG: No results for input(s): GLUCAP in the last 168 hours. Lipid Profile: No results for input(s): CHOL, HDL, LDLCALC, TRIG, CHOLHDL, LDLDIRECT in the last 72 hours. Thyroid Function Tests: No results for input(s): TSH, T4TOTAL, FREET4, T3FREE, THYROIDAB in the last 72 hours. Anemia Panel: No results for input(s): VITAMINB12, FOLATE, FERRITIN, TIBC, IRON, RETICCTPCT in the last 72 hours. Sepsis Labs: No results for input(s): PROCALCITON, LATICACIDVEN in the last 168 hours.  No results found for this or any previous visit (from the past 240 hour(s)).       Radiology Studies: Dg Chest 2 View  Result Date: 02/16/2017 CLINICAL DATA:  Dyspnea on exertion. Hypoxia. Progressive shortness breath for 2 days. Esophageal cancer. EXAM: CHEST  2 VIEW COMPARISON:  Chest film associated with Port-A-Cath insertion 01/14/2017. PET scan 12/08/2016. FINDINGS: Heart size is normal. Lung volumes are low. There is no edema or effusion. No focal airspace disease present. Aortic atherosclerosis is noted. A right IJ Port-A-Cath is stable. Surgical clips are noted in the right breast and axilla.  The upper abdomen is unremarkable. IMPRESSION: 1. No acute cardiopulmonary disease. 2. Low lung volumes. Electronically Signed   By: San Morelle M.D.   On: 02/16/2017 13:05   Nm Pulmonary Perf And Vent  Addendum Date: 02/16/2017   ADDENDUM REPORT: 02/16/2017 20:22 ADDENDUM: Initial attempts to contact Hospitalists by pager at [facility]. Ultimately, Critical Value/emergent results were called by telephone at the time of interpretation on 02/16/2017 at 8:20 pm to Nurse [facility], who verbally acknowledged these results. Electronically Signed   By: Suzy Bouchard M.D.   On: 02/16/2017 20:22   Result Date: 02/16/2017 CLINICAL DATA:  Elevated D-dimer. Esophageal carcinoma. Elevated white count. Short of breath. EXAM: NUCLEAR MEDICINE VENTILATION - PERFUSION LUNG SCAN TECHNIQUE: Ventilation images were obtained in multiple projections  using inhaled aerosol Tc-1m DTPA. Perfusion images were obtained in multiple projections after intravenous injection of Tc-32m MAA. RADIOPHARMACEUTICALS:  28.8 mCi Technetium-32m DTPA aerosol inhalation and 4.1 mCi Technetium-45m MAA IV COMPARISON:  Chest radiograph 02/16/2017 FINDINGS: Ventilation: Informed ventilation per Perfusion: There are subtle wedge-shaped perfusion defects in the LEFT RIGHT upper lobe. Smaller defects at the LEFT and RIGHT lung base posteriorly. These peripheral perfusion defects are unmatched to ventilation. IMPRESSION: Bilateral un matched wedge-shaped peripheral perfusion defects most consistent with acute pulmonary embolism. Electronically Signed: By: Suzy Bouchard M.D. On: 02/16/2017 19:50   Dg Swallowing Func-speech Pathology  Result Date: 02/17/2017 CLINICAL DATA:  71 year old female diagnosed with Lymphoma including a mass at the left tracheoesophageal groove near the thyroid. Multiple imaging guided biopsies were performed, but ultimately a surgical biopsy of the mass was necessary on 12/26/2016 for diagnosis. The patient is now  status post chemotherapy, with an unexplained cough, coughing with p.o. intake. EXAM: MODIFIED BARIUM SWALLOW TECHNIQUE: Different consistencies of barium were administered orally to the patient by the Speech Pathologist. Imaging of the pharynx was performed in the lateral projection. FLUOROSCOPY TIME:  Fluoroscopy Time:  0 minutes 54 seconds Radiation Exposure Index (if provided by the fluoroscopic device): 1.8mG y Number of Acquired Spot Images: 0 COMPARISON:  Neck CT 11/21/2016. FINDINGS: Nectar thick liquid- the patient is able to swallow liquid into the cervical esophagus, but with each swallow there is barium extension from the cervical esophagus (C5/C6 level) through the posterior wall of the trachea into the tracheal lumen. This occurs about 18 mm below the level of the glottis (series 5, image 4). 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 PO barium liquid freely passed through the fistula and into the airway with each swallow. Study discussed by telephone with Dr. Hosie Poisson on 02/17/2017 at 1245 hours. Please refer to the Speech Pathologists report for complete details and recommendations. Electronically Signed   By: Genevie Ann M.D.   On: 02/17/2017 13:28        Scheduled Meds: . allopurinol  300 mg Oral Daily  . atorvastatin  10 mg Oral Daily  . enoxaparin (LOVENOX) injection  90 mg Subcutaneous Q12H  . levothyroxine  50 mcg Oral QAC breakfast  . pantoprazole  40 mg Oral Daily  . sucralfate  1 g Oral TID WC & HS   Continuous Infusions: . ampicillin-sulbactam (UNASYN) IV Stopped (02/17/17 1213)     LOS: 1 day    Time spent: 35 minutes.     Hosie Poisson, MD [facility] Pager [phone]  If 7PM-7AM, please contact night-coverage [email or web address] Password [email or web address] 02/17/2017, 6:51 PM

## 2017-02-18 ENCOUNTER — Other Ambulatory Visit: Payer: Medicare Other

## 2017-02-18 ENCOUNTER — Inpatient Hospital Stay (HOSPITAL_COMMUNITY): Payer: Medicare Other

## 2017-02-18 ENCOUNTER — Ambulatory Visit: Payer: Medicare Other

## 2017-02-18 DIAGNOSIS — J9621 Acute and chronic respiratory failure with hypoxia: Secondary | ICD-10-CM

## 2017-02-18 LAB — CBC
HEMATOCRIT: 24.9 % — AB (ref 36.0–46.0)
HEMOGLOBIN: 8.3 g/dL — AB (ref 12.0–15.0)
MCH: 29.1 pg (ref 26.0–34.0)
MCHC: 33.3 g/dL (ref 30.0–36.0)
MCV: 87.4 fL (ref 78.0–100.0)
Platelets: 131 10*3/uL — ABNORMAL LOW (ref 150–400)
RBC: 2.85 MIL/uL — ABNORMAL LOW (ref 3.87–5.11)
RDW: 17.4 % — AB (ref 11.5–15.5)
WBC: 11.1 10*3/uL — AB (ref 4.0–10.5)

## 2017-02-18 LAB — COMPREHENSIVE METABOLIC PANEL
ALBUMIN: 2.3 g/dL — AB (ref 3.5–5.0)
ALT: 134 U/L — ABNORMAL HIGH (ref 14–54)
ANION GAP: 7 (ref 5–15)
AST: 77 U/L — ABNORMAL HIGH (ref 15–41)
Alkaline Phosphatase: 50 U/L (ref 38–126)
BUN: 19 mg/dL (ref 6–20)
CHLORIDE: 107 mmol/L (ref 101–111)
CO2: 25 mmol/L (ref 22–32)
Calcium: 8 mg/dL — ABNORMAL LOW (ref 8.9–10.3)
Creatinine, Ser: 1.49 mg/dL — ABNORMAL HIGH (ref 0.44–1.00)
GFR calc non Af Amer: 34 mL/min — ABNORMAL LOW (ref >60)
GFR, EST AFRICAN AMERICAN: 40 mL/min — AB (ref >60)
GLUCOSE: 147 mg/dL — AB (ref 65–99)
POTASSIUM: 3.7 mmol/L (ref 3.5–5.1)
SODIUM: 139 mmol/L (ref 135–145)
Total Bilirubin: 0.5 mg/dL (ref 0.3–1.2)
Total Protein: 5.2 g/dL — ABNORMAL LOW (ref 6.5–8.1)

## 2017-02-18 LAB — TRIGLYCERIDES: TRIGLYCERIDES: 108 mg/dL (ref <150)

## 2017-02-18 LAB — PHOSPHORUS: PHOSPHORUS: 4 mg/dL (ref 2.5–4.6)

## 2017-02-18 LAB — GLUCOSE, CAPILLARY
GLUCOSE-CAPILLARY: 132 mg/dL — AB (ref 65–99)
GLUCOSE-CAPILLARY: 144 mg/dL — AB (ref 65–99)
Glucose-Capillary: 110 mg/dL — ABNORMAL HIGH (ref 65–99)

## 2017-02-18 LAB — PREALBUMIN: Prealbumin: 6 mg/dL — ABNORMAL LOW (ref 18–38)

## 2017-02-18 LAB — DIFFERENTIAL
BASOS ABS: 0 10*3/uL (ref 0.0–0.1)
Basophils Relative: 0 %
EOS ABS: 0 10*3/uL (ref 0.0–0.7)
EOS PCT: 0 %
LYMPHS ABS: 1 10*3/uL (ref 0.7–4.0)
Lymphocytes Relative: 9 %
MONO ABS: 0.8 10*3/uL (ref 0.1–1.0)
Monocytes Relative: 7 %
NEUTROS PCT: 84 %
Neutro Abs: 9.3 10*3/uL — ABNORMAL HIGH (ref 1.7–7.7)

## 2017-02-18 LAB — ECHOCARDIOGRAM COMPLETE
HEIGHTINCHES: 63 in
Weight: 3333.36 oz

## 2017-02-18 LAB — MAGNESIUM: MAGNESIUM: 1.7 mg/dL (ref 1.7–2.4)

## 2017-02-18 MED ORDER — FAT EMULSION 20 % IV EMUL
240.0000 mL | INTRAVENOUS | Status: AC
  Administered 2017-02-18: 240 mL via INTRAVENOUS
  Filled 2017-02-18: qty 250

## 2017-02-18 MED ORDER — SODIUM CHLORIDE 0.9 % IV SOLN
INTRAVENOUS | Status: DC
  Administered 2017-02-20: via INTRAVENOUS

## 2017-02-18 MED ORDER — CEFAZOLIN SODIUM-DEXTROSE 2-4 GM/100ML-% IV SOLN
2.0000 g | INTRAVENOUS | Status: AC
Start: 2017-02-19 — End: 2017-02-19
  Administered 2017-02-19: 2 g via INTRAVENOUS
  Filled 2017-02-18: qty 100

## 2017-02-18 MED ORDER — FAT EMULSION 20 % IV EMUL
240.0000 mL | INTRAVENOUS | Status: DC
  Filled 2017-02-18: qty 250

## 2017-02-18 MED ORDER — TRACE MINERALS CR-CU-MN-SE-ZN 10-1000-500-60 MCG/ML IV SOLN
INTRAVENOUS | Status: AC
  Administered 2017-02-18: 18:00:00 via INTRAVENOUS
  Filled 2017-02-18: qty 1200

## 2017-02-18 NOTE — Progress Notes (Signed)
TC attending to inform that [facility] transfer liason have left a vm, & will contact him again. Faxed v/s, & labs to [phone].

## 2017-02-18 NOTE — Progress Notes (Signed)
  Echocardiogram 2D Echocardiogram has been performed.  Merrie Roof F 02/18/2017, 11:39 AM

## 2017-02-18 NOTE — Care Management Note (Signed)
Case Management Note  Patient Details  Name: Linetta Regner MRN: [id number] Date of Birth: 1946-03-31  Subjective/Objective:  Attending asked CM about possible transfer to [facility] for tertiary service-ENT/CT-spoke to [facility] transfer center liason Augustin Coupe @ [phone] w/confirmation demographic sheet [phone],[email or web address] attending contact info.                 Action/Plan:may transfer   Expected Discharge Date:   (unknown)               Expected Discharge Plan:  Home/Self Care  In-House Referral:     Discharge planning Services  CM Consult  Post Acute Care Choice:    Choice offered to:     DME Arranged:    DME Agency:     HH Arranged:    HH Agency:     Status of Service:  In process, will continue to follow  If discussed at Long Length of Stay Meetings, dates discussed:    Additional Comments:  Dessa Phi, RN 02/18/2017, 12:16 PM

## 2017-02-18 NOTE — Progress Notes (Signed)
PHARMACY - ADULT TOTAL PARENTERAL NUTRITION CONSULT NOTE   Pharmacy Consult for TPN Indication: NPO  Patient Measurements: IBW: 52 kg AdjBW: 63 kg Height: 63 inches Weight: 208 lb 5.4 oz (94.5 kg)  Usual Weight:   Insulin Requirements: 1 unit SSI  Current Nutrition: NPO  IVF: NS at 75 ml/hr  Central access: PAC TPN start date: 02/17/17  ASSESSMENT                                                                                                          HPI: Patient's a 71 y.o F with high grade large cell lymphoma and with severe lymphadenopathy in the neck currently undergoing chemotherapy, presented to [facility] on 10/28 from the [facility] for evaluation of SOB. V/Q scan showed findings consistent with acute PE. LE doppler neg for DVT. 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 Patient is likely not a candidate for endoscopic stent placement.  Plan is to consult CT surgery for recommendations. Per Dr. Karleen Hampshire, Dr. Lindi Adie recommends to start TPN for patient while she's NPO.   Significant events:   Today:   Glucose: CBG 132 (goal < 150)  Electrolytes: all WNL including CorrCa 9.36  Renal: SCrr down 1.49 (CrCl~38);  LFTs: AST/ALT trending down 77/134, Tbili wnl  TGs: 108  Prealbumin: 6.0  NUTRITIONAL GOALS                                                                                             RD recs: pending   Est protein requirement = 82 - 101 gm; kcal= 1575 -1890  Clinimix E 5/15 at a goal rate of 70 ml/hr + 20% fat emulsion at 20 ml/hr (for 12 hrs) to provide: 84 g/day protein, 1672 Kcal/day.  PLAN                                                                                                                         At 1800 today:  Increase Clinimix E 5/15 at 50 ml/hr.  20% fat emulsion at 20 ml/hr for 12 hrs.  Plan to advance as tolerated to the goal rate.  TPN to contain  standard multivitamins and trace  elements.  Continue sensitive SSI q6h   TPN lab panels on Mondays & Thursdays.  F/u daily. Plan noted for IR consult feeding tube placement for enteral nutrition.  Peggyann Juba, PharmD, BCPS Pager: [phone] 02/18/2017,12:07 PM

## 2017-02-18 NOTE — Progress Notes (Signed)
Initial Nutrition Assessment  DOCUMENTATION CODES:   Obesity unspecified  INTERVENTION:  - TPN/TPN advancement per Pharmacy. - Will order TF following G-tube placement.  NUTRITION DIAGNOSIS:   Inadequate oral intake related to  (tumor/fistula in throat/neck) as evidenced by per patient/family report, NPO status (coughing and choking with all PO intakes).  GOAL:   Patient will meet greater than or equal to 90% of their needs  MONITOR:   Weight trends, Labs, Other (Comment) (TPN regimen, placement of G-tube)  REASON FOR ASSESSMENT:   Consult New TPN/TNA, Enteral/tube feeding initiation and management  ASSESSMENT:   71 y.o. 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 She relates that for the last 3 weeks she's been coughing every time she drinks liquids, any oncologist office she was found to have a saturation of 87% tachycardic at 114 and a blood pressure 118/48 she was placed into a nasal cannula and her saturations improved to 95%.  Consults for TPN and TF received. Spoke with pt and husband, who is at bedside, who report that they were informed that TPN is short-term solution and plan is for placement of G-tube; no G-tube in place at this time so will hold off on ordering TF. Pt's husband reports nearly all information as pt's voice is hoarse and it is sometimes difficult for her to talk for long periods of time. Pt was encouraged by Oncologist to consume nutrition as well as possible at home. She was having coughing/choking with all PO intakes but husband continued to provide encouragement. Up until Sunday (10/28) pt was consuming  20-30 ounces of liquids per day (items such as water and Ensure) and eating ~10 bites of solid foods (such as Lean Cuisine meals). Husband reports that pt was receiving IV hydration at the [facility] at least weekly with weekly labs and that K had previously been low. Pt began eating bananas and K rose to WDL.   She has a port and is receiving TPN via port. Currently receiving Clinimix E 5/15 @ 40 mL/hr with 20% ILE @ 20 mL/hr x12 hours. Plan to increase Clinimix E 5/15 to 50 mL/hr and maintain current ILE order tonight. This regimen will provide 60 grams of protein and 1332 kcal.   Medications reviewed; sliding scale Novolog, 25 mcg IV Synthroid/day, 40 mg IV Protonix/day. Labs reviewed; CBGs: 110 and 132 mg/dL today, creatinine: 1.49 mg/dL, Ca: 8 mg/dL, LFTs elevated, GFR: 34 mL/min.  IVF: NS @ 75 mL/hr.     NUTRITION - FOCUSED PHYSICAL EXAM: No muscle or fat depletion noted   Diet Order:  Diet NPO time specified TPN (CLINIMIX-E) Adult .TPN (CLINIMIX-E) Adult  EDUCATION NEEDS:   Education needs have been addressed  Skin:  Skin Assessment: Reviewed RN Assessment  Last BM:  10/30  Height:   Ht Readings from Last 1 Encounters:  02/16/17 5\' 3"  (1.6 m)    Weight:   Wt Readings from Last 1 Encounters:  02/17/17 208 lb 5.4 oz (94.5 kg)    Ideal Body Weight:  52.27 kg  BMI:  Body mass index is 36.9 kg/m.  Estimated Nutritional Needs:   Kcal:  9562-1308 (23-25 kcal/kg)  Protein:  95-115 grams  Fluid:  >/= 2.1 L/day     Jarome Matin, MS, RD, LDN, [facility] Inpatient Clinical Dietitian Pager # [phone] After hours/weekend pager # [phone]

## 2017-02-18 NOTE — Consult Note (Signed)
Patient is well known to me from her diagnostic workup. She was admitted with pulmonary embolus. She has done pretty well with her chemotherapy. She states that after the first treatment most of her symptoms actually went away immediately. She had no more pain. 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 Imaging yesterday revealed a tracheoesophageal fistula about 2 cm below the glottis. I suspect that the tumor in this area is responding to the chemotherapy and has left behind a small defect.  On the swallowing study, she demonstrates effective clearing of secretions from the airway. She has not developed any signs of infiltrates or aspiration pneumonia. I would recommend that we keep her nothing by mouth for this foreseeable future. Gastrostomy tube would be worthwhile. After she has completed treatment for the pulmonary embolus, and the chemotherapy regimen, she may require surgical repair of the fistula. She will need to be referred to one of the local [facility] for this. I will speak with the ENT department at [facility] and see if this is something they think they could take care of.  Please call me if there are any other concerns or issues.

## 2017-02-18 NOTE — Progress Notes (Signed)
PROGRESS NOTE    Myrical Andujo  HUD:[id number] DOB: [birth year] DOA: 02/16/2017 PCP: Marletta Lor, MD   Brief Narrative: Romanita Fager is a 71 y.o. 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  Since she was coughing with po intake, SLP eval was done and she underwent MBS showing a esophageal-tracheal fistula.  GI consulted, recommended CT surgery evaluation. Unfortunately CT surgery said, she would benefit from transfer to a tertiary care surgery for surgical repair of the fistula. Updated Dr Lindi Adie. ENT, Dr. Constance Holster has recommended definitive care of pulmonary emboli and chemotherapy prior to surgical management of TE fistula. Plan is to continue TPN until PEG tube is placed.  Assessment & Plan:  Active Problems:   Essential hypertension   Chronic kidney disease   Diffuse large B cell lymphoma (HCC)   AKI (acute kidney injury) ([place])   Acute on chronic respiratory failure with hypoxia (HCC)   Acute respiratory failure with hypoxia (HCC)  Acute respiratory failure with hypoxia: Secondary to pulmonary embolism.  - DC Unasyn. Initial concern for aspiration pneumonitis/pneumonia, though she effectively clears airway secretions on MBS and has no CXR infiltrates or fever.  - Continue lovenox for PE, transition to [place] pending renal function.   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  - Discussed with oncology, ENT, CT surgery who recommend referral to subspecialty surgeon at tertiary care center. However, this is not emergent.  - Continue NPO - TPN per pharmacy - Order to PEG tube placed, appreciate nutrition assistance with education and tube feeding once placed.  DLBCL: s/p 2 cycles of chemo per  Dr. Lindi Adie, planning 4 more cycles.  - Per oncology.  AKI on stage III CKD: Improving.  - Monitor BMP daily with rehydration.  Hypertension; better controlled.  Prn hydralazine.   Elevated liver function test;  Stop the lipitor.    Leukocytosis: Infection vs. VTE  vs neulasta.  - Monitor fever curve off abx  Anemia of chronic disease : - Monitor hemoglobin.   Thrombocytopenia: No signs of bleeding. Possibly from chemo.  - Monitor daily, continue anticoagulation  DVT prophylaxis: Therapeutic lovenox.  Code Status: Full Family Communication: Husband at bedside Disposition Plan: Home pending gastrostomy tube placement and education/titration of tube feeding. Will eventually need subspecialty referral for TE fistula.   Consultants:   Gastroenterology, Dr. Loletha Carrow  ENT, Dr. Constance Holster  Discussed with CT surgery, Dr. Prescott Gum, over the phone.   Dr. Lindi Adie   Procedures: venous duplex.   MBS.   Echocardiogram 10/31: - Left ventricle: The cavity size was normal. Wall thickness was   increased in a pattern of mild LVH. Systolic function was normal.   The estimated ejection fraction was in the range of 60% to 65%.   Wall motion was normal; there were no regional wall motion   abnormalities. Doppler parameters are consistent with abnormal   left ventricular relaxation (grade 1 diastolic dysfunction). The   E/e&' ratio is <8, suggesting normal LV filling pressure. - Left atrium: The atrium was mildly dilated. - Right ventricle: The cavity size was normal. Wall thickness was   normal. The moderator band was prominent. Systolic function was   normal. - Right atrium: The atrium was at the upper limits of normal in   size. -  Tricuspid valve: There was trivial regurgitation. - Pulmonary arteries: PA peak pressure: 27 mm Hg (S). - Inferior vena cava: The vessel was normal in size. The   respirophasic diameter changes were in the normal range (>= 50%),   consistent with normal  central venous pressure.  Impressions: - Compared to a prior study in 12/2016, there are no significant   changes.  Antimicrobials: unasyn thru 10/31   Subjective: Wants to have G-tube placed. Has ongoing sputum from cough and some shortness of breath. No chest pain.   Objective: BP (!) 146/63 (BP Location: Right Arm)   Pulse 80   Temp 98.4 F (36.9 C) (Oral)   Resp 20   Wt 94.5 kg (208 lb 5.4 oz)   SpO2 96%   BMI 36.90 kg/m  Gen: Alert, pleasant chronically ill-appearing 71 y.o. female in NAD HEENT: MMM, posterior oropharynx clear. Hoarse voice.  Pulm: Non-labored; decreased at bases on supplemental oxygen. CV: Regular rate, no murmur appreciated; distal pulses intact/symmetric GI: + BS; soft, non-tender, non-distended Skin: No rashes, wounds, ulcers Neuro: A&Ox3, CN II-XII without deficits  CBC:  Recent Labs Lab 02/16/17 1056 02/17/17 0700 02/18/17 0514  WBC 13.2* 12.8* 11.1*  NEUTROABS 11.8*  --  9.3*  HGB 11.0* 8.5* 8.3*  HCT 33.6* 25.6* 24.9*  MCV 86.1 86.5 87.4  PLT 126* 109* 222*   Basic Metabolic Panel:  Recent Labs Lab 02/16/17 1056 02/17/17 0700 02/17/17 1838 02/18/17 0514  NA 134* 138  --  139  K 4.0 3.5  --  3.7  CL  --  105  --  107  CO2 22 24  --  25  GLUCOSE 245* 95  --  147*  BUN 41.3* 31*  --  19  CREATININE 2.6* 1.71*  --  1.49*  CALCIUM 9.2 7.7*  --  8.0*  MG  --   --  1.8 1.7  PHOS  --   --  3.5 4.0   GFR: Estimated Creatinine Clearance: 38.4 mL/min (A) (by C-G formula based on SCr of 1.49 mg/dL (H)). Liver Function Tests:  Recent Labs Lab 02/16/17 1056 02/17/17 0700 02/18/17 0514  AST 91* 90* 77*  ALT 218* 160* 134*  ALKPHOS 69 50 50  BILITOT 0.67 0.5 0.5  PROT 6.8 5.2* 5.2*  ALBUMIN 2.6* 2.2* 2.3*   CBG:  Recent Labs Lab 02/18/17 0022 02/18/17 0535 02/18/17 1253  GLUCAP 110* 132* 144*   Lipid Profile:  Recent Labs  02/18/17 0514  TRIG 108   Radiology Studies: Nm Pulmonary Perf And Vent  Addendum  Date: 02/16/2017   ADDENDUM REPORT: 02/16/2017 20:22 ADDENDUM: Initial attempts to contact Hospitalists by pager at [facility]. Ultimately, Critical Value/emergent results were called by telephone at the time of interpretation on 02/16/2017 at 8:20 pm to Nurse [facility], who verbally acknowledged these results. Electronically Signed   By: Suzy Bouchard M.D.   On: 02/16/2017 20:22   Result Date: 02/16/2017 CLINICAL DATA:  Elevated D-dimer. Esophageal carcinoma. Elevated white count. Short of breath. EXAM: NUCLEAR MEDICINE VENTILATION - PERFUSION LUNG SCAN TECHNIQUE: Ventilation images were obtained in multiple projections using inhaled aerosol Tc-60m DTPA. Perfusion images were obtained in multiple projections after intravenous injection of Tc-5m MAA. RADIOPHARMACEUTICALS:  28.8 mCi Technetium-60m DTPA aerosol inhalation and 4.1 mCi Technetium-26m MAA IV COMPARISON:  Chest radiograph 02/16/2017 FINDINGS: Ventilation: Informed ventilation per Perfusion: There are subtle wedge-shaped perfusion defects in the LEFT RIGHT upper lobe. Smaller defects at the LEFT and RIGHT lung base posteriorly. These peripheral  perfusion defects are unmatched to ventilation. IMPRESSION: Bilateral un matched wedge-shaped peripheral perfusion defects most consistent with acute pulmonary embolism. Electronically Signed: By: Suzy Bouchard M.D. On: 02/16/2017 19:50   Dg Swallowing Func-speech Pathology  Result Date: 02/17/2017 CLINICAL DATA:  71 year old female diagnosed with Lymphoma including a mass at the left tracheoesophageal groove near the thyroid. Multiple imaging guided biopsies were performed, but ultimately a surgical biopsy of the mass was necessary on 12/26/2016 for diagnosis. The patient is now status post chemotherapy, with an unexplained cough, coughing with p.o. intake. EXAM: MODIFIED BARIUM SWALLOW TECHNIQUE: Different consistencies of barium were administered orally to the patient by the Speech Pathologist.  Imaging of the pharynx was performed in the lateral projection. FLUOROSCOPY TIME:  Fluoroscopy Time:  0 minutes 54 seconds Radiation Exposure Index (if provided by the fluoroscopic device): 1.8mG y Number of Acquired Spot Images: 0 COMPARISON:  Neck CT 11/21/2016. FINDINGS: Nectar thick liquid- the patient is able to swallow liquid into the cervical esophagus, but with each swallow there is barium extension from the cervical esophagus (C5/C6 level) through the posterior wall of the trachea into the tracheal lumen. This occurs about 18 mm below the level of the glottis (series 5, image 4). 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 PO barium liquid freely passed through the fistula and into the airway with each swallow. Study discussed by telephone with Dr. Hosie Poisson on 02/17/2017 at 1245 hours. Please refer to the Speech Pathologists report for complete details and recommendations. Electronically Signed   By: Genevie Ann M.D.   On: 02/17/2017 13:28   Scheduled Meds: . enoxaparin (LOVENOX) injection  90 mg Subcutaneous Q12H  . insulin aspart  0-9 Units Subcutaneous Q6H  . levothyroxine  25 mcg Intravenous QAC breakfast  . pantoprazole (PROTONIX) IV  40 mg Intravenous Q24H   Continuous Infusions: . Marland KitchenTPN (CLINIMIX-E) Adult     And  . fat emulsion    . sodium chloride 75 mL/hr at 02/18/17 1132  . sodium chloride    . ampicillin-sulbactam (UNASYN) IV 1.5 g (02/18/17 1345)  . Marland KitchenTPN (CLINIMIX-E) Adult 40 mL/hr at 02/17/17 2047   And  . fat emulsion Stopped (02/18/17 0848)     LOS: 2 days   Time spent: 25 minutes.   Vance Gather, MD [facility] Pager [phone]  If 7PM-7AM, please contact night-coverage [email or web address] Password [email or web address] 02/18/2017, 2:23 PM

## 2017-02-18 NOTE — Progress Notes (Signed)
Referring Physician(s): Grunz,R  Supervising Physician: Markus Daft  Patient Status:  [facility] - In-pt  Chief Complaint:  tracheo-esophageal fistula, need for enteral nutrition  Subjective: Patient familiar to IR service from prior left cervical lymph node biopsy on 12/01/16, left thyroid bx on 12/17/16 and port a cath placement on 01/14/17. She has a history of large cell lymphoma status post chemotherapy. Recently admitted with shortness of breath, cough, hypoxia, tachycardia, acute on CKD and found to have bilateral PE on VQ scan. Patient also underwent modified barium swallow secondary to coughing with oral intake and found to have tracheoesophageal fistula at level of cervical esophagus. She is currently receiving TPN. Request now received for percutaneous gastrostomy tube placement for nutrition.Additional history as below. Past Medical History:  Diagnosis Date  . Arthritis   . Cancer ([place]) 03/2009   breast- rt  . GERD (gastroesophageal reflux disease)   . History of radiation therapy 07/12/10,completed   right breast 60 Gy x30 fx  . Hypertension   . Hypothyroidism   . Obesity   . Peripheral vascular disease ([place]) 1995   PT DEVELOPED CIRCULATION PROBLEMS IN BOTH HANDS AND GANGRENE OF BOTH INDEX FINGERS--REQUIRING AMPUTATION OF THE INDEX FINGERS AND VASCULAR SURGERY.  PT TOLD HER PROBLEMS RELATED TO SMOKING.   NO OTHER PROBLEMS SINCE.  Marland Kitchen Pneumonia   . Thyroid mass    Past Surgical History:  Procedure Laterality Date  . AMPUTATION     partial amputation of both index fingers  . BREAST SURGERY  2011   lumpectomy with node sampling- RIGHT  . COLONOSCOPY    . ESOPHAGOGASTRODUODENOSCOPY    . EXCISION MASS NECK Left 12/26/2016   Procedure: EXCISION MASS NECK;  Surgeon: Izora Gala, MD;  Location: [facility];  Service: ENT;  Laterality: Left;  open excision of thyroid mass left side with frozen section  . HAND SURGERY Bilateral 1995   Amputaed pointer fingers bilaterally  . IR FLUORO  GUIDE PORT INSERTION RIGHT  01/14/2017  . IR US GUIDE VASC ACCESS RIGHT  01/14/2017  . TOTAL HIP ARTHROPLASTY  12/16/2011   right hip  . TOTAL HIP ARTHROPLASTY  01/20/2012   Procedure: TOTAL HIP ARTHROPLASTY ANTERIOR APPROACH;  Surgeon: Mauri Pole, MD;  Location: [facility];  Service: Orthopedics;  Laterality: Left;  . TUBAL LIGATION    . VASCULAR SURGERY     both hands     Allergies: Patient has no known allergies.  Medications: Prior to Admission medications   Medication Sig Start Date End Date Taking? Authorizing Provider  allopurinol (ZYLOPRIM) 300 MG tablet Take 1 tablet (300 mg total) by mouth daily. 01/01/17  Yes Nicholas Lose, MD  atorvastatin (LIPITOR) 10 MG tablet TAKE 1 TABLET EVERY DAY 10/06/16  Yes Marletta Lor, MD  chlorpheniramine-HYDROcodone (TUSSIONEX) 10-8 MG/5ML SUER Take 5 mLs by mouth every 12 (twelve) hours as needed for cough. 02/11/17  Yes Tanner, Lyndon Code., PA-C  HYDROcodone-acetaminophen (NORCO) 7.5-325 MG tablet Take 1 tablet by mouth every 6 (six) hours as needed for moderate pain. 12/26/16  Yes Izora Gala, MD  levothyroxine (SYNTHROID, LEVOTHROID) 50 MCG tablet TAKE 1 TABLET BY MOUTH DAILY 09/08/16  Yes Marletta Lor, MD  losartan-hydrochlorothiazide [facility]) 100-25 MG tablet TAKE 1 TABLET BY MOUTH EVERY DAY 12/25/16  Yes Marletta Lor, MD  omeprazole (PRILOSEC) 20 MG capsule TAKE 1 CAPSULE BY MOUTH EVERY DAY 12/24/16  Yes Marletta Lor, MD  ondansetron (ZOFRAN) 8 MG tablet Take 1 tablet (8 mg total) by  mouth 2 (two) times daily as needed for refractory nausea / vomiting. Start on day 3 after cyclophosphamide chemotherapy. 01/01/17  Yes Nicholas Lose, MD  predniSONE (DELTASONE) 20 MG tablet Take 3 tablets (60 mg total) by mouth daily. Take on days 1-5 of chemotherapy. 01/01/17  Yes Nicholas Lose, MD  sucralfate (CARAFATE) 1 GM/10ML suspension Take 10 mLs (1 g total) by mouth 4 (four) times daily -  with meals and at bedtime. 12/12/16  Yes  Armbruster, Carlota Raspberry, MD  lidocaine (XYLOCAINE) 2 % solution Use as directed 20 mLs in the mouth or throat every 3 (three) hours as needed for mouth pain. Patient not taking: Reported on 02/16/2017 12/26/16   Izora Gala, MD  lidocaine-prilocaine (EMLA) cream Apply to affected area once Patient not taking: Reported on 02/16/2017 01/01/17   Nicholas Lose, MD  LORazepam (ATIVAN) 0.5 MG tablet Take 1 tablet (0.5 mg total) by mouth every 6 (six) hours as needed (Nausea or vomiting). 01/01/17   Nicholas Lose, MD  magic mouthwash w/lidocaine SOLN Take 5 mLs by mouth 3 (three) times daily as needed for mouth pain. Patient not taking: Reported on 02/16/2017 01/16/17   Nicholas Lose, MD  prochlorperazine (COMPAZINE) 10 MG tablet Take 1 tablet (10 mg total) by mouth every 6 (six) hours as needed (Nausea or vomiting). 01/01/17   Nicholas Lose, MD  promethazine (PHENERGAN) 25 MG suppository Place 1 suppository (25 mg total) rectally every 6 (six) hours as needed for nausea or vomiting. Patient not taking: Reported on 01/20/2017 12/26/16   Izora Gala, MD     Vital Signs: BP 129/70 (BP Location: Right Arm)   Pulse 86   Temp 98.1 F (36.7 C) (Oral)   Resp 18   Wt 208 lb 5.4 oz (94.5 kg)   SpO2 96%   BMI 36.90 kg/m   Physical Exam :awake, alert. Chest with slightly diminished breath sounds bases; clean, intact right chest wall Port-A-Cath; heart with regular rate and rhythm; abdomen obese, soft, positive bowel sounds, nontender; lower extremities with some trace edema. Imaging: Dg Chest 2 View  Result Date: 02/16/2017 CLINICAL DATA:  Dyspnea on exertion. Hypoxia. Progressive shortness breath for 2 days. Esophageal cancer. EXAM: CHEST  2 VIEW COMPARISON:  Chest film associated with Port-A-Cath insertion 01/14/2017. PET scan 12/08/2016. FINDINGS: Heart size is normal. Lung volumes are low. There is no edema or effusion. No focal airspace disease present. Aortic atherosclerosis is noted. A right IJ Port-A-Cath  is stable. Surgical clips are noted in the right breast and axilla. The upper abdomen is unremarkable. IMPRESSION: 1. No acute cardiopulmonary disease. 2. Low lung volumes. Electronically Signed   By: San Morelle M.D.   On: 02/16/2017 13:05   Nm Pulmonary Perf And Vent  Addendum Date: 02/16/2017   ADDENDUM REPORT: 02/16/2017 20:22 ADDENDUM: Initial attempts to contact Hospitalists by pager at [place]. Ultimately, Critical Value/emergent results were called by telephone at the time of interpretation on 02/16/2017 at 8:20 pm to Nurse [facility], who verbally acknowledged these results. Electronically Signed   By: Suzy Bouchard M.D.   On: 02/16/2017 20:22   Result Date: 02/16/2017 CLINICAL DATA:  Elevated D-dimer. Esophageal carcinoma. Elevated white count. Short of breath. EXAM: NUCLEAR MEDICINE VENTILATION - PERFUSION LUNG SCAN TECHNIQUE: Ventilation images were obtained in multiple projections using inhaled aerosol Tc-38m DTPA. Perfusion images were obtained in multiple projections after intravenous injection of Tc-57m MAA. RADIOPHARMACEUTICALS:  28.8 mCi Technetium-54m DTPA aerosol inhalation and 4.1 mCi Technetium-31m MAA IV COMPARISON:  Chest radiograph  02/16/2017 FINDINGS: Ventilation: Informed ventilation per Perfusion: There are subtle wedge-shaped perfusion defects in the LEFT RIGHT upper lobe. Smaller defects at the LEFT and RIGHT lung base posteriorly. These peripheral perfusion defects are unmatched to ventilation. IMPRESSION: Bilateral un matched wedge-shaped peripheral perfusion defects most consistent with acute pulmonary embolism. Electronically Signed: By: Suzy Bouchard M.D. On: 02/16/2017 19:50   Dg Swallowing Func-speech Pathology  Result Date: 02/17/2017 CLINICAL DATA:  71 year old female diagnosed with Lymphoma including a mass at the left tracheoesophageal groove near the thyroid. Multiple imaging guided biopsies were performed, but ultimately a surgical biopsy of the  mass was necessary on 12/26/2016 for diagnosis. The patient is now status post chemotherapy, with an unexplained cough, coughing with p.o. intake. EXAM: MODIFIED BARIUM SWALLOW TECHNIQUE: Different consistencies of barium were administered orally to the patient by the Speech Pathologist. Imaging of the pharynx was performed in the lateral projection. FLUOROSCOPY TIME:  Fluoroscopy Time:  0 minutes 54 seconds Radiation Exposure Index (if provided by the fluoroscopic device): 1.8mG y Number of Acquired Spot Images: 0 COMPARISON:  Neck CT 11/21/2016. FINDINGS: Nectar thick liquid- the patient is able to swallow liquid into the cervical esophagus, but with each swallow there is barium extension from the cervical esophagus (C5/C6 level) through the posterior wall of the trachea into the tracheal lumen. This occurs about 18 mm below the level of the glottis (series 5, image 4). 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 PO barium liquid freely passed through the fistula and into the airway with each swallow. Study discussed by telephone with Dr. Hosie Poisson on 02/17/2017 at 1245 hours. Please refer to the Speech Pathologists report for complete details and recommendations. Electronically Signed   By: Genevie Ann M.D.   On: 02/17/2017 13:28    Labs:  CBC:  Recent Labs  02/11/17 1048 02/16/17 1056 02/17/17 0700 02/18/17 0514  WBC 6.7 13.2* 12.8* 11.1*  HGB 10.1* 11.0* 8.5* 8.3*  HCT 30.9* 33.6* 25.6* 24.9*  PLT 104* 126* 109* 131*    COAGS:  Recent Labs  12/01/16 0823 01/14/17 0745  INR 0.94 1.02  APTT 25 24    BMP:  Recent Labs  03/25/16 1019 12/24/16 1133  02/11/17 1048 02/16/17 1056 02/17/17 0700 02/18/17 0514  NA 141 139  < > 138 134* 138 139  K 4.1 3.3*  < > 3.8 4.0 3.5 3.7  CL 106 101  --   --   --  105 107  CO2 25 26  < > 23 22 24 25   GLUCOSE 99 112*  < > 162* 245* 95 147*  BUN 19 25*   < > 34.1* 41.3* 31* 19  CALCIUM 9.0 9.5  < > 8.5 9.2 7.7* 8.0*  CREATININE 1.22* 1.54*  < > 1.1 2.6* 1.71* 1.49*  GFRNONAA  --  33*  --   --   --  29* 34*  GFRAA  --  38*  --   --   --  34* 40*  < > = values in this interval not displayed.  LIVER FUNCTION TESTS:  Recent Labs  02/11/17 1048 02/16/17 1056 02/17/17 0700 02/18/17 0514  BILITOT 0.65 0.67 0.5 0.5  AST 84* 91* 90* 77*  ALT 449* 218* 160* 134*  ALKPHOS 70 69 50 50  PROT 5.9* 6.8 5.2* 5.2*  ALBUMIN 2.6* 2.6* 2.2* 2.3*    Assessment and Plan: Pt with history of large  cell lymphoma, status post chemotherapy. Recently admitted with shortness of breath, cough, hypoxia, tachycardia, acute on CKD and found to have bilateral PE on VQ scan. Patient also underwent modified barium swallow secondary to coughing with oral intake and found to have tracheoesophageal fistula at level of cervical esophagus. She is currently receiving TPN. Request now received for percutaneous gastrostomy tube placement for nutrition.Prior imaging studies were reviewed by Dr. Vernard Gambles and patient appears to be candidate for gastrostomy tube placement.Risks and benefits discussed with the patient/son including, but not limited to the need for a barium enema during the procedure, bleeding, infection, peritonitis, or damage to adjacent structures.All of the patient's questions were answered, patient is agreeable to proceed.Consent signed and in chart.Procedure tentatively planned for 11/1.     Electronically Signed: D. Rowe [name], PA-C 02/18/2017, 5:09 PM   I spent a total of 25 minutes at the the patient's bedside AND on the patient's hospital floor or unit, greater than 50% of which was counseling/coordinating care for percutaneous gastrostomy tube placement    Patient ID: Kristen Escobar, female   DOB: 02/26/1946, 71 y.o.   MRN: [id number]

## 2017-02-18 NOTE — Progress Notes (Signed)
Physical Therapy Treatment Patient Details Name: Samreet Edenfield MRN: [id number] DOB: October 24, 1945 Today's Date: 02/18/2017       History of Present Illness 71 yo female admitted with acute PE. Hx of lymphoma, PVD, breast cancer, obesity, hypothyroidism    PT Comments    Progressing with mobility.  O2 sat: 95% 2L [place] O2 at rest, 90% 2L [place] O2 during ambulation.   Follow Up Recommendations  No PT follow up     Equipment Recommendations  None recommended by PT    Recommendations for Other Services       Precautions / Restrictions Precautions Precautions: Fall Precaution Comments: monitor O2 sat Restrictions Weight Bearing Restrictions: No    Mobility  Bed Mobility Overal bed mobility: Modified Independent                Transfers Overall transfer level: Needs assistance   Transfers: Sit to/from Stand Sit to Stand: Supervision         General transfer comment: for safety, lines.   Ambulation/Gait Ambulation/Gait assistance: Min guard Ambulation Distance (Feet): 125 Feet Assistive device:  (IV pole) Gait Pattern/deviations: Step-through pattern;Decreased stride length     General Gait Details: mildly unsteady. Dyspnea 3/4. O2 sat 90% on 2L [place] O2.    Stairs            Wheelchair Mobility    Modified Rankin (Stroke Patients Only)       Balance                                            Cognition Arousal/Alertness: Awake/alert Behavior During Therapy: WFL for tasks assessed/performed Overall Cognitive Status: Within Functional Limits for tasks assessed                                        Exercises      General Comments        Pertinent Vitals/Pain Pain Assessment: No/denies pain    Home Living                      Prior Function            PT Goals (current goals can now be found in the care plan section) Progress towards PT goals: Progressing toward goals    Frequency    7X/week      PT Plan Current plan remains appropriate    Co-evaluation              AM-PAC PT "6 Clicks" Daily Activity  Outcome Measure  Difficulty turning over in bed (including adjusting bedclothes, sheets and blankets)?: None Difficulty moving from lying on back to sitting on the side of the bed? : None Difficulty sitting down on and standing up from a chair with arms (e.g., wheelchair, bedside commode, etc,.)?: None Help needed moving to and from a bed to chair (including a wheelchair)?: A Little Help needed walking in hospital room?: A Little Help needed climbing 3-5 steps with a railing? : A Little 6 Click Score: 21    End of Session Equipment Utilized During Treatment: Oxygen Activity Tolerance: Patient tolerated treatment well Patient left: in bed;with call bell/phone within reach   PT Visit Diagnosis: Muscle weakness (generalized) (M62.81);Difficulty in walking, not elsewhere classified (R26.2)  Time: 0981-1914 PT Time Calculation (min) (ACUTE ONLY): 15 min  Charges:  $Gait Training: 8-22 mins                    G Codes:          Weston Anna, MPT Pager: [phone]

## 2017-02-19 ENCOUNTER — Encounter (HOSPITAL_COMMUNITY): Payer: Self-pay | Admitting: Diagnostic Radiology

## 2017-02-19 ENCOUNTER — Inpatient Hospital Stay (HOSPITAL_COMMUNITY): Payer: Medicare Other

## 2017-02-19 HISTORY — PX: IR GASTROSTOMY TUBE MOD SED: IMG625

## 2017-02-19 LAB — CBC WITH DIFFERENTIAL/PLATELET
BASOS PCT: 0 %
Basophils Absolute: 0 10*3/uL (ref 0.0–0.1)
EOS ABS: 0 10*3/uL (ref 0.0–0.7)
EOS PCT: 0 %
HEMATOCRIT: 25.8 % — AB (ref 36.0–46.0)
HEMOGLOBIN: 8.4 g/dL — AB (ref 12.0–15.0)
LYMPHS PCT: 11 %
Lymphs Abs: 1 10*3/uL (ref 0.7–4.0)
MCH: 28.5 pg (ref 26.0–34.0)
MCHC: 32.6 g/dL (ref 30.0–36.0)
MCV: 87.5 fL (ref 78.0–100.0)
MONOS PCT: 8 %
Monocytes Absolute: 0.7 10*3/uL (ref 0.1–1.0)
NEUTROS ABS: 7.6 10*3/uL (ref 1.7–7.7)
Neutrophils Relative %: 81 %
Platelets: 166 10*3/uL (ref 150–400)
RBC: 2.95 MIL/uL — ABNORMAL LOW (ref 3.87–5.11)
RDW: 17.5 % — ABNORMAL HIGH (ref 11.5–15.5)
WBC: 9.3 10*3/uL (ref 4.0–10.5)

## 2017-02-19 LAB — PHOSPHORUS: Phosphorus: 4.1 mg/dL (ref 2.5–4.6)

## 2017-02-19 LAB — PROTIME-INR
INR: 1.1
Prothrombin Time: 14.1 seconds (ref 11.4–15.2)

## 2017-02-19 LAB — COMPREHENSIVE METABOLIC PANEL
ALBUMIN: 2.1 g/dL — AB (ref 3.5–5.0)
ALT: 105 U/L — AB (ref 14–54)
AST: 58 U/L — ABNORMAL HIGH (ref 15–41)
Alkaline Phosphatase: 47 U/L (ref 38–126)
Anion gap: 10 (ref 5–15)
BILIRUBIN TOTAL: 0.6 mg/dL (ref 0.3–1.2)
BUN: 14 mg/dL (ref 6–20)
CHLORIDE: 104 mmol/L (ref 101–111)
CO2: 24 mmol/L (ref 22–32)
CREATININE: 1.18 mg/dL — AB (ref 0.44–1.00)
Calcium: 7.7 mg/dL — ABNORMAL LOW (ref 8.9–10.3)
GFR calc Af Amer: 53 mL/min — ABNORMAL LOW (ref >60)
GFR, EST NON AFRICAN AMERICAN: 46 mL/min — AB (ref >60)
GLUCOSE: 145 mg/dL — AB (ref 65–99)
Potassium: 3.1 mmol/L — ABNORMAL LOW (ref 3.5–5.1)
Sodium: 138 mmol/L (ref 135–145)
Total Protein: 5.2 g/dL — ABNORMAL LOW (ref 6.5–8.1)

## 2017-02-19 LAB — MAGNESIUM: MAGNESIUM: 1.6 mg/dL — AB (ref 1.7–2.4)

## 2017-02-19 LAB — GLUCOSE, CAPILLARY
GLUCOSE-CAPILLARY: 139 mg/dL — AB (ref 65–99)
GLUCOSE-CAPILLARY: 167 mg/dL — AB (ref 65–99)
Glucose-Capillary: 156 mg/dL — ABNORMAL HIGH (ref 65–99)
Glucose-Capillary: 159 mg/dL — ABNORMAL HIGH (ref 65–99)
Glucose-Capillary: 168 mg/dL — ABNORMAL HIGH (ref 65–99)

## 2017-02-19 MED ORDER — POTASSIUM CHLORIDE 10 MEQ/50ML IV SOLN
10.0000 meq | INTRAVENOUS | Status: DC
  Filled 2017-02-19 (×4): qty 50

## 2017-02-19 MED ORDER — FAT EMULSION 20 % IV EMUL
240.0000 mL | INTRAVENOUS | Status: AC
  Administered 2017-02-19: 240 mL via INTRAVENOUS
  Filled 2017-02-19: qty 250

## 2017-02-19 MED ORDER — FENTANYL CITRATE (PF) 100 MCG/2ML IJ SOLN
INTRAMUSCULAR | Status: AC
  Filled 2017-02-19: qty 4

## 2017-02-19 MED ORDER — CEFAZOLIN SODIUM-DEXTROSE 2-4 GM/100ML-% IV SOLN
INTRAVENOUS | Status: AC
Start: 2017-02-19 — End: 2017-02-20
  Filled 2017-02-19: qty 100

## 2017-02-19 MED ORDER — LIDOCAINE HCL 1 % IJ SOLN
INTRAMUSCULAR | Status: AC | PRN
  Administered 2017-02-19: 10 mL

## 2017-02-19 MED ORDER — IOPAMIDOL (ISOVUE-300) INJECTION 61%
50.0000 mL | Freq: Once | INTRAVENOUS | Status: AC | PRN
  Administered 2017-02-19: 25 mL

## 2017-02-19 MED ORDER — MAGNESIUM SULFATE 2 GM/50ML IV SOLN
2.0000 g | Freq: Once | INTRAVENOUS | Status: AC
  Administered 2017-02-19: 2 g via INTRAVENOUS
  Filled 2017-02-19: qty 50

## 2017-02-19 MED ORDER — HEPARIN (PORCINE) IN NACL 100-0.45 UNIT/ML-% IJ SOLN
1200.0000 [IU]/h | INTRAMUSCULAR | Status: DC
  Administered 2017-02-19 – 2017-02-21 (×3): 1200 [IU]/h via INTRAVENOUS
  Filled 2017-02-19 (×3): qty 250

## 2017-02-19 MED ORDER — MIDAZOLAM HCL 2 MG/2ML IJ SOLN
INTRAMUSCULAR | Status: AC | PRN
  Administered 2017-02-19 (×2): 1 mg via INTRAVENOUS

## 2017-02-19 MED ORDER — LIDOCAINE HCL 1 % IJ SOLN
INTRAMUSCULAR | Status: AC
  Filled 2017-02-19: qty 20

## 2017-02-19 MED ORDER — IOPAMIDOL (ISOVUE-300) INJECTION 61%
INTRAVENOUS | Status: AC
  Filled 2017-02-19: qty 50

## 2017-02-19 MED ORDER — GLUCAGON HCL RDNA (DIAGNOSTIC) 1 MG IJ SOLR
INTRAMUSCULAR | Status: AC | PRN
  Administered 2017-02-19: .5 mg via INTRAVENOUS

## 2017-02-19 MED ORDER — POTASSIUM CHLORIDE 10 MEQ/100ML IV SOLN
10.0000 meq | INTRAVENOUS | Status: DC
  Filled 2017-02-19 (×4): qty 100

## 2017-02-19 MED ORDER — TRACE MINERALS CR-CU-MN-SE-ZN 10-1000-500-60 MCG/ML IV SOLN
INTRAVENOUS | Status: AC
  Administered 2017-02-19: 19:00:00 via INTRAVENOUS
  Filled 2017-02-19: qty 1560

## 2017-02-19 MED ORDER — MIDAZOLAM HCL 2 MG/2ML IJ SOLN
INTRAMUSCULAR | Status: AC
  Filled 2017-02-19: qty 6

## 2017-02-19 MED ORDER — FENTANYL CITRATE (PF) 100 MCG/2ML IJ SOLN
INTRAMUSCULAR | Status: AC | PRN
  Administered 2017-02-19: 25 ug via INTRAVENOUS
  Administered 2017-02-19: 50 ug via INTRAVENOUS
  Administered 2017-02-19: 25 ug via INTRAVENOUS

## 2017-02-19 MED ORDER — POTASSIUM CHLORIDE 10 MEQ/100ML IV SOLN
10.0000 meq | INTRAVENOUS | Status: AC
  Administered 2017-02-19 (×4): 10 meq via INTRAVENOUS
  Filled 2017-02-19 (×4): qty 100

## 2017-02-19 MED ORDER — MORPHINE SULFATE (PF) 4 MG/ML IV SOLN
2.0000 mg | INTRAVENOUS | Status: DC | PRN
  Administered 2017-02-19 – 2017-02-20 (×2): 2 mg via INTRAVENOUS
  Filled 2017-02-19 (×2): qty 1

## 2017-02-19 MED ORDER — GLUCAGON HCL RDNA (DIAGNOSTIC) 1 MG IJ SOLR
INTRAMUSCULAR | Status: AC
  Filled 2017-02-19: qty 1

## 2017-02-19 NOTE — Sedation Documentation (Signed)
Pt is tolerating procedure very well.

## 2017-02-19 NOTE — Progress Notes (Signed)
[place] for Heparin post G tube placement Indication: PE  No Known Allergies  Patient Measurements: Height: 5\' 2"  (157.5 cm) Weight: 211 lb 9.6 oz (96 kg) IBW/kg (Calculated) : 50.1 Documented height 63 inches, weight 93.6kg  Vital Signs: Temp: 98.7 F (37.1 C) (11/01 1521) Temp Source: Oral (11/01 1521) BP: 155/88 (11/01 1848) Pulse Rate: 78 (11/01 1848)  Labs:  Recent Labs  02/17/17 0700 02/18/17 0514 02/19/17 0441  HGB 8.5* 8.3* 8.4*  HCT 25.6* 24.9* 25.8*  PLT 109* 131* 166  LABPROT  --   --  14.1  INR  --   --  1.10  CREATININE 1.71* 1.49* 1.18*   Estimated Creatinine Clearance: 48 mL/min (A) (by C-G formula based on SCr of 1.18 mg/dL (H)).  Medical History: Past Medical History:  Diagnosis Date  . Arthritis   . Cancer ([place]) 03/2009   breast- rt  . GERD (gastroesophageal reflux disease)   . History of radiation therapy 07/12/10,completed   right breast 60 Gy x30 fx  . Hypertension   . Hypothyroidism   . Obesity   . Peripheral vascular disease ([place]) 1995   PT DEVELOPED CIRCULATION PROBLEMS IN BOTH HANDS AND GANGRENE OF BOTH INDEX FINGERS--REQUIRING AMPUTATION OF THE INDEX FINGERS AND VASCULAR SURGERY.  PT TOLD HER PROBLEMS RELATED TO SMOKING.   NO OTHER PROBLEMS SINCE.  Marland Kitchen Pneumonia   . Thyroid mass    Assessment: 51 yoF admitted on 10/29 with acute on chronic respiratory failure.  Possible PE with elevated D-dimer 10.3.   Pharmacy is consulted to dose Lovenox for possible PE.  VQ Scan impression most consistent with acute pulmonary embolism.   Most recent Lovenox 90 mg given at [facility] on 10/29 at 1514.  AKI on CKD on presentation with SCr 2.6 and CrCl ~ 22 ml/min so scheduled for Lovenox q24h to be started 10/30 at 1500.  This morning's lab work show improving SCr with CrCl>30 ml/min so will adjust to Lovenox 1 mg/kg q12h dosing. CBC: Hgb decreased to 8.5 following IVF replacement - will f/u if trends further off  fluids. No bleeding reported. Plt low on admission likely due to chemotherapy administered on 10/19.  ++ IR placed order on 10/31 to hold LMWH until after gastrostomy tube placement on 11/1 --> d/ced order on 10/31 PM;  f/u with IR timing for resuming AC after procedure at 3p today. Inform Dr. Bonner Puna and he will place order for Xarelto ++ ** Dr. Anselm Pancoast contacted, no instruction on resuming anti-coag in Progress note. Do not resume Lovenox yet, cover with IV Heparin, Radiology will round in am, assess any bleed, guide resumption of Lovenox**  11/1 Begin Heparin infusion, no load  Goal of Therapy:  Anti-Xa level 0.6-1 units/ml 4hrs after LMWH dose given Monitor platelets by anticoagulation protocol: Yes  Heparin level 0.3-0.7 units/ml   Plan:   Heparin infusion at 1200 units/hr, no bolus  Heparin level in am at 0500  Radiology to assess in am if able to resume Lovenox   Monitor SCr, CBC, bleeding  Minda Ditto PharmD Pager [phone] 02/19/2017, 7:52 PM

## 2017-02-19 NOTE — Sedation Documentation (Signed)
Patient is resting comfortably. Vitals stable. 

## 2017-02-19 NOTE — Sedation Documentation (Signed)
Vital signs stable. Pt resting at this time 

## 2017-02-19 NOTE — Sedation Documentation (Signed)
Pt tolerate procedure very well.

## 2017-02-19 NOTE — Procedures (Addendum)
  Pre-operative Diagnosis:  Lymphoma and tracheo-esophageal fistula       Post-operative Diagnosis:  Same   Indications: Needs G-tube due to fistula  Procedure: Placement of percutaneous gastrostomy   Findings: 5 French catheter was advanced down esophagus without complication.  3 SAF-T-PEXY fasteners were placed and 16 Fr balloon-retention gastrostomy tube placed between T-fasteners.  Balloon inflated with 6 ml of saline and contrast injection confirmed placement in stomach.    Complications: None     EBL: Minimal  Plan: Low intermittent wall suction for 2 hours tonight.  Will check tube tomorrow prior to starting feeds.

## 2017-02-19 NOTE — Sedation Documentation (Signed)
Patient is resting comfortably. 

## 2017-02-19 NOTE — Progress Notes (Signed)
PROGRESS NOTE    Kristen Escobar  DDU:[id number] DOB: [birth year] DOA: 02/16/2017 PCP: Marletta Lor, MD   Brief Narrative: Kristen Escobar is a 71 y.o. 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  Since she was coughing with po intake, SLP eval was done and she underwent MBS showing a esophageal-tracheal fistula.  GI consulted, recommended CT surgery evaluation. Unfortunately CT surgery said, she would benefit from transfer to a tertiary care surgery for surgical repair of the fistula. Updated Dr Lindi Adie. ENT, Dr. Constance Holster has recommended definitive care of pulmonary emboli and chemotherapy prior to surgical management of TE fistula. Plan is to continue TPN until PEG tube is placed.  Assessment & Plan:  Active Problems:   Essential hypertension   Chronic kidney disease   Diffuse large B cell lymphoma (HCC)   AKI (acute kidney injury) ([place])   Acute on chronic respiratory failure with hypoxia (HCC)   Acute respiratory failure with hypoxia (HCC)  Acute respiratory failure with hypoxia: Secondary to pulmonary embolism. Initial concern for aspiration pneumonitis/pneumonia, though she effectively clears airway secretions on MBS and has no CXR infiltrates or fever so unasyn was stopped 10/31.  - Continue lovenox for PE, transition to [place] pending renal function once able to administer per tube.  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  - Discussed with oncology, ENT, CT surgery who recommend referral to subspecialty surgeon at tertiary care center. However, this is not emergent.  - Continue NPO - TPN per pharmacy, can stop TPN this evening once tube feeds are started.  - Order to PEG tube placed, appreciate  nutrition assistance with education and tube feeding once placed.  DLBCL: s/p 2 cycles of chemo per Dr. Lindi Adie, planning 4 more cycles.  - Per oncology.  AKI on stage III CKD: Improving, seems to have returned to baseline with CrCl ~83ml/min.  - Monitor BMP daily with rehydration.  Hypertension: At goal - Prn hydralazine.   Elevated LFT: - Holding statin  Leukocytosis: Infection vs. VTE  vs neulasta.  - Monitor fever curve off abx  Anemia of chronic disease : - Monitor hemoglobin.   Thrombocytopenia: No signs of bleeding. Possibly from chemo.  - Monitor daily, continue anticoagulation  DVT prophylaxis: Therapeutic lovenox > DOAC, timing pending IR recommendation.  Code Status: Full Family Communication: Husband at bedside Disposition Plan: Home pending gastrostomy tube placement and education/titration of tube feeding. Will eventually need subspecialty referral for TE fistula.   Consultants:   Gastroenterology, Dr. Loletha Carrow  ENT, Dr. Constance Holster  Discussed with CT surgery, Dr. Prescott Gum, over the phone.   Oncology, Dr. Lindi Adie   Procedures: venous duplex.   MBS.   Echocardiogram 10/31: - Left ventricle: The cavity size was normal. Wall thickness was   increased in a pattern of mild LVH. Systolic function was normal.   The estimated ejection fraction was in the range of 60% to 65%.   Wall motion was normal; there were no regional wall motion   abnormalities. Doppler parameters are consistent with abnormal   left ventricular relaxation (grade 1 diastolic dysfunction). The   E/e&' ratio is <8, suggesting normal LV filling pressure. - Left atrium: The atrium was mildly dilated. - Right ventricle: The cavity size was normal. Wall thickness was  normal. The moderator band was prominent. Systolic function was   normal. - Right atrium: The atrium was at the upper limits of normal in   size. - Tricuspid valve: There was trivial regurgitation. - Pulmonary arteries: PA peak  pressure: 27 mm Hg (S). - Inferior vena cava: The vessel was normal in size. The   respirophasic diameter changes were in the normal range (>= 50%),   consistent with normal central venous pressure.  Impressions: - Compared to a prior study in 12/2016, there are no significant   changes.  Antimicrobials: unasyn thru 10/31  Subjective: Continues to have clear sputum from cough, no fever or chills. No chest pain. + dyspnea significant with exertion.  Objective: BP (!) 122/51 (BP Location: Right Arm)   Pulse 78   Temp 98.7 F (37.1 C) (Oral)   Resp 20   Ht 5\' 2"  (1.575 m)   Wt 96 kg (211 lb 9.6 oz)   SpO2 99%   BMI 38.70 kg/m  Gen: Alert, pleasant chronically ill-appearing 71 y.o. female in NAD HEENT: MMM, posterior oropharynx clear. Hoarse voice.  Pulm: Non-labored; decreased at bases on supplemental oxygen. CV: Regular rate, no murmur appreciated; distal pulses intact/symmetric GI: + BS; soft, non-tender, non-distended Skin: No rashes, wounds, ulcers Neuro: A&Ox3, CN II-XII without deficits. No aphasia/dysphasia.   CBC:  Recent Labs Lab 02/16/17 1056 02/17/17 0700 02/18/17 0514 02/19/17 0441  WBC 13.2* 12.8* 11.1* 9.3  NEUTROABS 11.8*  --  9.3* 7.6  HGB 11.0* 8.5* 8.3* 8.4*  HCT 33.6* 25.6* 24.9* 25.8*  MCV 86.1 86.5 87.4 87.5  PLT 126* 109* 131* 528   Basic Metabolic Panel:  Recent Labs Lab 02/16/17 1056 02/17/17 0700 02/17/17 1838 02/18/17 0514 02/19/17 0441  NA 134* 138  --  139 138  K 4.0 3.5  --  3.7 3.1*  CL  --  105  --  107 104  CO2 22 24  --  25 24  GLUCOSE 245* 95  --  147* 145*  BUN 41.3* 31*  --  19 14  CREATININE 2.6* 1.71*  --  1.49* 1.18*  CALCIUM 9.2 7.7*  --  8.0* 7.7*  MG  --   --  1.8 1.7 1.6*  PHOS  --   --  3.5 4.0 4.1   GFR: Estimated Creatinine Clearance: 48 mL/min (A) (by C-G formula based on SCr of 1.18 mg/dL (H)). Liver Function Tests:  Recent Labs Lab 02/16/17 1056 02/17/17 0700 02/18/17 0514 02/19/17 0441  AST  91* 90* 77* 58*  ALT 218* 160* 134* 105*  ALKPHOS 69 50 50 47  BILITOT 0.67 0.5 0.5 0.6  PROT 6.8 5.2* 5.2* 5.2*  ALBUMIN 2.6* 2.2* 2.3* 2.1*   CBG:  Recent Labs Lab 02/18/17 1253 02/18/17 1810 02/19/17 0004 02/19/17 0539 02/19/17 1156  GLUCAP 144* 139* 156* 159* 168*   Lipid Profile:  Recent Labs  02/18/17 0514  TRIG 108   Radiology Studies: No results found. Scheduled Meds: . insulin aspart  0-9 Units Subcutaneous Q6H  . iopamidol      . levothyroxine  25 mcg Intravenous QAC breakfast  . lidocaine      . pantoprazole (PROTONIX) IV  40 mg Intravenous Q24H   Continuous Infusions: . Marland KitchenTPN (CLINIMIX-E) Adult 50 mL/hr at 02/18/17 1735  . Marland KitchenTPN (CLINIMIX-E) Adult     And  . fat emulsion    . sodium chloride 65 mL/hr at 02/18/17 1800  .  ceFAZolin (ANCEF) IV  LOS: 3 days   Time spent: 25 minutes.   Vance Gather, MD [facility] Pager [phone]  If 7PM-7AM, please contact night-coverage [email or web address] Password [email or web address] 02/19/2017, 4:53 PM

## 2017-02-19 NOTE — Sedation Documentation (Signed)
Vital signs stable. 

## 2017-02-19 NOTE — Progress Notes (Signed)
IV team was notified to start another PIV because the patient is going to start getting a Heparin Infusion.

## 2017-02-19 NOTE — Progress Notes (Signed)
PHARMACY - ADULT TOTAL PARENTERAL NUTRITION CONSULT NOTE   Pharmacy Consult for TPN Indication: NPO  Patient Measurements: IBW: 52 kg AdjBW: 63 kg Height: 63 inches Height: 5\' 2"  (157.5 cm) Weight: 211 lb 9.6 oz (96 kg) IBW/kg (Calculated) : 50.1  Usual Weight:   Insulin Requirements: 7 unit SSI  Current Nutrition: NPO  IVF: NS at 65 ml/hr  Central access: PAC TPN start date: 02/17/17  ASSESSMENT                                                                                                          HPI: Patient's a 71 y.o F with high grade large cell lymphoma and with severe lymphadenopathy in the neck currently undergoing chemotherapy, presented to [facility] on 10/28 from the [facility] for evaluation of SOB. V/Q scan showed findings consistent with acute PE. LE doppler neg for DVT. 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 Patient is likely not a candidate for endoscopic stent placement.  Plan is to consult CT surgery for recommendations. Per Dr. Karleen Hampshire, Dr. Lindi Adie recommends to start TPN for patient while she's NPO.   Significant events:  11/1: G tube placement by IR planned for today  Today:   Glucose: CBG at goal < 150  Electrolytes: K+ low at 3.1, Mg low at 1.6, CorrCa WNL at 9.22  Renal: SCr down 1.18 (CrCl~48);  LFTs: AST/ALT trending down 58/105, Tbili wnl  TGs: 108 (10/31)  Prealbumin: 6.0 (10/31)  NUTRITIONAL GOALS                                                                                             RD recs: 6073-7106 kcal (23-25 kcal/kg), 95-115 grams protein per day  Clinimix E 5/20 at a goal rate of 80 ml/hr + 20% fat emulsion at 20 ml/hr (for 12 hrs) to provide: 96 g/day protein, 2169 Kcal/day.  PLAN                                                                                                                          Now:  Magnesium sulfate 2g IV x 1  Potassium chloride 46meq  x 4   At 1800  today:  Increase Clinimix E 5/15 at 65 ml/hr.  20% fat emulsion at 20 ml/hr for 12 hrs.  Plan to advance as tolerated to the goal rate.  Decrease IVF to 50 ml/hr  TPN to contain standard multivitamins and trace elements.  Continue sensitive SSI q6h   TPN lab panels on Mondays & Thursdays.  F/u daily. Plan noted for G tube placement and enteral nutrition initiation.  Peggyann Juba, PharmD, BCPS Pager: [phone] 02/19/2017,7:39 AM

## 2017-02-19 NOTE — Progress Notes (Signed)
Nutrition Follow-up  DOCUMENTATION CODES:   Obesity unspecified  INTERVENTION:  - Continue TPN/TPN advancement per Pharmacy. - RD will order TF following PEG placement.   Monitor magnesium, potassium, and phosphorus daily for at least 3 days, MD to replete as needed, as pt is at risk for refeeding syndrome given poor PO intakes PTA, current hypokalemia and hypomagnesemia.   NUTRITION DIAGNOSIS:   Inadequate oral intake related to  (tumor/fistula in throat/neck) as evidenced by per patient/family report, NPO status (coughing and choking with all PO intakes). -ongoing  GOAL:   Patient will meet greater than or equal to 90% of their needs -unmet with current TPN rate, no TF  MONITOR:   Weight trends, Labs, Other (Comment) (TPN regimen, placement of PEG)  ASSESSMENT:   71 y.o. 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 She relates that for the last 3 weeks she's been coughing every time she drinks liquids, any oncologist office she was found to have a saturation of 87% tachycardic at 114 and a blood pressure 118/48 she was placed into a nasal cannula and her saturations improved to 95%.  11/1 Per IR note this AM, tentative plan for PEG placement today. Will order TF tomorrow if PEG placed today. Pt with port and is receiving Clinimix E 5/15 @ 50 mL/hr with 20% ILE @ 20 mL/hr x12 hours with plan to continue ILE regimen and increase Clinimix E 5/15 to 65 mL/hr today at 1800. This regimen will provide 78 grams of protein (82% minimum estimated protein need) and 1588 kcal (73% minimum estimated kcal need). Pharmacy note this AM states goal  for TPN: Clinimix  5/20 @ 80 mL/hr with 20% ILE @ 20 mL/hr x12 hours. This regimen will provide 96 grams of protein (100% minimum estimated protein need) and 2170 kcal (100% minimum estimated kcal need). Will communicate with Pharmacist tomorrow about any adjustment that may need to be made to TPN if TF is able to be started.   Medications reviewed; sliding scale Novolog, 25 mcg IV Synthroid/day, 2 g IV Mg sulfate x1 run today, 40 mg IV Protonix/day, 10 mEq IV KCl x4 runs today.  Labs reviewed; CBGs: 156 and 159 mg/dL today, creatinine: 1.18 mg/dL, Ca: 7.7 mg/dL, LFTs elevated, GFR: 46 mL/min, K: 3.1 mmol/L, Mg: 1.6 mg/dL.  IVF: NS @ 65 mL/hr.     10/31 - Spoke with pt and husband, who is at bedside. - They were informed that TPN is short-term solution and plan is for placement of G-tube. - No G-tube in place at this time so will hold off on ordering TF.  - Pt's husband reports nearly all information as pt's voice is hoarse and it is sometimes difficult for her to talk.  - Pt was encouraged by Oncologist to consume nutrition as well as possible at home.  - She was having coughing/choking with all PO intakes but husband continued to provide encouragement.  - Up until Sunday (10/28) pt was consuming 20-30 ounces of liquids per day (water and Ensure) and eating ~10 bites of solid foods (Lean Cuisine meals).  - Pt was receiving IV hydration at the [facility] at least weekly with  weekly labs and that K had previously been low.  - Pt began eating bananas and K rose to WDL.  - She has a port and is receiving TPN via port.  - Currently receiving Clinimix E 5/15 @ 40 mL/hr with 20% ILE @ 20 mL/hr x12 hours. - Plan to increase Clinimix E 5/15 to 50 mL/hr and maintain current ILE order tonight.  - This regimen will provide 60 grams of protein and 1332 kcal.   IVF: NS @ 75 mL/hr.     Diet Order:  .TPN (CLINIMIX-E) Adult Diet NPO time specified .TPN (CLINIMIX-E) Adult  EDUCATION NEEDS:    Education needs have been addressed  Skin:  Skin Assessment: Reviewed RN Assessment  Last BM:  10/30  Height:   Ht Readings from Last 1 Encounters:  02/19/17 5\' 2"  (1.575 m)    Weight:   Wt Readings from Last 1 Encounters:  02/19/17 211 lb 9.6 oz (96 kg)    Ideal Body Weight:  52.27 kg  BMI:  Body mass index is 38.7 kg/m.  Estimated Nutritional Needs:   Kcal:  3419-3790 (23-25 kcal/kg)  Protein:  95-115 grams  Fluid:  >/= 2.1 L/day     Jarome Matin, MS, RD, LDN, [facility] Clinical Dietitian Pager # [phone] After hours/weekend pager # [phone]

## 2017-02-19 NOTE — Sedation Documentation (Signed)
Pt is resting, procedure started

## 2017-02-19 NOTE — Care Management Note (Signed)
Case Management Note  Patient Details  Name: Kristen Escobar MRN: [id number] Date of Birth: Aug 12, 1945  Subjective/Objective: Per attending no acute to acute transfer needed. For PEG tube today.                    Action/Plan:d/c plan home.   Expected Discharge Date:   (unknown)               Expected Discharge Plan:  Home/Self Care  In-House Referral:     Discharge planning Services  CM Consult  Post Acute Care Choice:    Choice offered to:     DME Arranged:    DME Agency:     HH Arranged:    HH Agency:     Status of Service:  In process, will continue to follow  If discussed at Long Length of Stay Meetings, dates discussed:    Additional Comments:  Dessa Phi, RN 02/19/2017, 11:16 AM

## 2017-02-20 ENCOUNTER — Other Ambulatory Visit: Payer: Medicare Other

## 2017-02-20 ENCOUNTER — Ambulatory Visit: Payer: Medicare Other

## 2017-02-20 LAB — COMPREHENSIVE METABOLIC PANEL
ALBUMIN: 2.1 g/dL — AB (ref 3.5–5.0)
ALK PHOS: 47 U/L (ref 38–126)
ALT: 91 U/L — ABNORMAL HIGH (ref 14–54)
AST: 56 U/L — AB (ref 15–41)
Anion gap: 7 (ref 5–15)
BILIRUBIN TOTAL: 0.3 mg/dL (ref 0.3–1.2)
BUN: 16 mg/dL (ref 6–20)
CALCIUM: 7.7 mg/dL — AB (ref 8.9–10.3)
CO2: 22 mmol/L (ref 22–32)
Chloride: 106 mmol/L (ref 101–111)
Creatinine, Ser: 0.95 mg/dL (ref 0.44–1.00)
GFR calc Af Amer: >60 mL/min (ref >60)
GFR calc non Af Amer: 59 mL/min — ABNORMAL LOW (ref >60)
GLUCOSE: 169 mg/dL — AB (ref 65–99)
Potassium: 3.6 mmol/L (ref 3.5–5.1)
Sodium: 135 mmol/L (ref 135–145)
TOTAL PROTEIN: 5.4 g/dL — AB (ref 6.5–8.1)

## 2017-02-20 LAB — CBC
HEMATOCRIT: 26.3 % — AB (ref 36.0–46.0)
HEMOGLOBIN: 8.8 g/dL — AB (ref 12.0–15.0)
MCH: 29 pg (ref 26.0–34.0)
MCHC: 33.5 g/dL (ref 30.0–36.0)
MCV: 86.8 fL (ref 78.0–100.0)
Platelets: 190 10*3/uL (ref 150–400)
RBC: 3.03 MIL/uL — AB (ref 3.87–5.11)
RDW: 18 % — ABNORMAL HIGH (ref 11.5–15.5)
WBC: 11.7 10*3/uL — AB (ref 4.0–10.5)

## 2017-02-20 LAB — GLUCOSE, CAPILLARY
GLUCOSE-CAPILLARY: 149 mg/dL — AB (ref 65–99)
Glucose-Capillary: 151 mg/dL — ABNORMAL HIGH (ref 65–99)
Glucose-Capillary: 172 mg/dL — ABNORMAL HIGH (ref 65–99)
Glucose-Capillary: 182 mg/dL — ABNORMAL HIGH (ref 65–99)

## 2017-02-20 LAB — HEPARIN LEVEL (UNFRACTIONATED)
HEPARIN UNFRACTIONATED: 0.32 [IU]/mL (ref 0.30–0.70)
HEPARIN UNFRACTIONATED: 0.42 [IU]/mL (ref 0.30–0.70)

## 2017-02-20 LAB — PHOSPHORUS: Phosphorus: 3.5 mg/dL (ref 2.5–4.6)

## 2017-02-20 LAB — MAGNESIUM: Magnesium: 1.9 mg/dL (ref 1.7–2.4)

## 2017-02-20 MED ORDER — INSULIN ASPART 100 UNIT/ML ~~LOC~~ SOLN
0.0000 [IU] | Freq: Four times a day (QID) | SUBCUTANEOUS | Status: DC
  Administered 2017-02-20 – 2017-02-21 (×4): 3 [IU] via SUBCUTANEOUS
  Administered 2017-02-22 – 2017-02-23 (×2): 2 [IU] via SUBCUTANEOUS

## 2017-02-20 MED ORDER — OSMOLITE 1.5 CAL PO LIQD
1000.0000 mL | ORAL | Status: DC
  Administered 2017-02-20 – 2017-02-22 (×2): 1000 mL
  Filled 2017-02-20 (×4): qty 1000

## 2017-02-20 MED ORDER — TRACE MINERALS CR-CU-MN-SE-ZN 10-1000-500-60 MCG/ML IV SOLN
INTRAVENOUS | Status: AC
  Administered 2017-02-20: 17:00:00 via INTRAVENOUS
  Filled 2017-02-20: qty 1560

## 2017-02-20 MED ORDER — JEVITY 1.2 CAL PO LIQD: 1000.0000 mL | ORAL | Status: DC

## 2017-02-20 MED ORDER — FAT EMULSION 20 % IV EMUL
240.0000 mL | INTRAVENOUS | Status: AC
  Administered 2017-02-20: 240 mL via INTRAVENOUS
  Filled 2017-02-20: qty 250

## 2017-02-20 NOTE — Progress Notes (Signed)
Speech Language Pathology Treatment: Dysphagia  Patient Details Name: Kristen Escobar MRN: [id number] DOB: 15-Nov-1945 Today's Date: 02/20/2017 Time: 1400-1446 SLP Time Calculation (min) (ACUTE ONLY): 46 min  Assessment / Plan / Recommendation Clinical Impression  SLP received call from RD regarding pt's dysphagia, family desiring input re: swallowing concerns.  SLP spoke to Dr Bonner Puna regarding recommendation for pt to be able to swish and expectorate with water freely to aid oral hygiene and be allowed water from single small ice chips as tolerated to decrease disuse muscle atrophy.    Pt found to have significant colored secretions *black, red, whitish* on soft palate - to which she reports sensation after SLP questioned her.  With use of water to swish and expectorate, adequate clearance.  Pt did have excessive coughing with rinsing, suspect loosened secretions from oropharynx with possible minimal aspiration.  However pt is aspirating bacteria from oropharynx.        Reviewed that melted ice consumption goal is to help mitigate secretions and maximize hygiene and need to dc consumption if pt not tolerating.   Provided written s/s of aspiration pneumonia.  Using teach back with mod I, pt and family educated thoroughly.    Whitish coating on tongue, ? Oral candidiasis or just coating due to NPO.  Advised family to monitor closely.   Will follow up for swallowing exercises to mitigate disuse muscle atrophy.  Thanks.   HPI HPI: 71 yo female adm to [facility] from cancer center due to respiratory difficulties= found to have a pulmonary embolism.  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  She is s/p neck mass excision on left by Dr Constance Holster 12/26/2016.  Hoarsesness noted by pt in May 2018 and odynophagia since July 2018.  Pt reports chemo started Sept has eliminated odynophagia but has caused her to cough/choke with all intake - weight loss reported  by patient.  Per spouse, pt's esophagus ulcerated signficantly per endoscopy.  Pt is consuming liquids primarily as she reports she can't swallow solids.  Very supportive family present (daughter Kristen Escobar and pt's spouse).  Pt also with h/o breast cancer s/p XRT done 07/12/2010.  Pt with excessive secretions after chemo per family *coughing up white to yellowish secretions in the middle of the night.  Per family, MD hopes for improved swallowing with tumor shrinkage half way through treatment a (six rounds planned) and pt has been encouraged to consume all po possible).  First treatment was approximately one month ago.  Per family, it pt's swallowing does not improve, oncology plans to refer to Dr Constance Holster again.  Swallow evaluation ordered due to aspiration concerns.        SLP Plan  Continue with current plan of care;Other (Comment) (will follow p to provide oropharyngeal exercises to mitigate disuse muscle atrophy)       Recommendations  Diet recommendations: NPO;Other(comment) (ice chips) Medication Administration: Via alternative means Supervision: Patient able to self feed Postural Changes and/or Swallow Maneuvers: Seated upright 90 degrees                SLP Visit Diagnosis: Dysphagia, pharyngoesophageal phase (R13.14) (pt with cervical esophageal fistula) Plan: Continue with current plan of care;Other (Comment) (will follow p to provide oropharyngeal exercises to mitigate disuse muscle atrophy)       GO              Luanna Salk, MS [facility] SLP [phone]   Macario Golds 02/20/2017, 2:58 PM

## 2017-02-20 NOTE — Care Management Note (Signed)
Case Management Note  Patient Details  Name: Marilin Kofman MRN: [id number] Date of Birth: [birth year]  Subjective/Objective:PT recc HHPT. Patient also has PEG, to start TF-await tolerance. Provided patient w/HHC agency list for HHC-chose [facility]-rep Santiago Glad aware & following for HHRN-enteral feeding instruction, formula, HHPT-await HHC orders.                   Action/Plan:d/c plan home w/HHC.   Expected Discharge Date:   (unknown)               Expected Discharge Plan:  [place]  In-House Referral:     Discharge planning Services  CM Consult  Post Acute Care Choice:    Choice offered to:  Patient  DME Arranged:    DME Agency:     HH Arranged:    [place] Agency:  [facility]  Status of Service:  In process, will continue to follow  If discussed at Long Length of Stay Meetings, dates discussed:    Additional Comments:  Dessa Phi, RN 02/20/2017, 2:46 PM

## 2017-02-20 NOTE — Progress Notes (Signed)
ANTICOAGULATION CONSULT NOTE  Pharmacy Consult for Heparin post G tube placement Indication: PE  No Known Allergies  Patient Measurements: Height: 5\' 2"  (157.5 cm) Weight: 211 lb 9.6 oz (96 kg) IBW/kg (Calculated) : 50.1 Documented height 63 inches, weight 93.6kg  Vital Signs: Temp: 98.4 F (36.9 C) (11/01 2100) Temp Source: Oral (11/01 2100) BP: 149/54 (11/01 2100) Pulse Rate: 86 (11/01 2100)  Labs:  Recent Labs  02/17/17 0700 02/18/17 0514 02/19/17 0441 02/20/17 0515  HGB 8.5* 8.3* 8.4* 8.8*  HCT 25.6* 24.9* 25.8* 26.3*  PLT 109* 131* 166 190  LABPROT  --   --  14.1  --   INR  --   --  1.10  --   HEPARINUNFRC  --   --   --  0.32  CREATININE 1.71* 1.49* 1.18*  --    Estimated Creatinine Clearance: 48 mL/min (A) (by C-G formula based on SCr of 1.18 mg/dL (H)).  Medical History: Past Medical History:  Diagnosis Date  . Arthritis   . Cancer ([place]) 03/2009   breast- rt  . GERD (gastroesophageal reflux disease)   . History of radiation therapy 07/12/10,completed   right breast 60 Gy x30 fx  . Hypertension   . Hypothyroidism   . Obesity   . Peripheral vascular disease ([place]) 1995   PT DEVELOPED CIRCULATION PROBLEMS IN BOTH HANDS AND GANGRENE OF BOTH INDEX FINGERS--REQUIRING AMPUTATION OF THE INDEX FINGERS AND VASCULAR SURGERY.  PT TOLD HER PROBLEMS RELATED TO SMOKING.   NO OTHER PROBLEMS SINCE.  Marland Kitchen Pneumonia   . Thyroid mass    Assessment: 7 yoF admitted on 10/29 with acute on chronic respiratory failure.  Possible PE with elevated D-dimer 10.3.   Pharmacy is consulted to dose Lovenox for possible PE.  VQ Scan impression most consistent with acute pulmonary embolism.   Most recent Lovenox 90 mg [facility] at [facility] on 10/29 at 1514.  AKI on CKD on presentation with SCr 2.6 and CrCl ~ 22 ml/min so scheduled for Lovenox q24h to be started 10/30 at 1500.  This morning's lab work show improving SCr with CrCl>30 ml/min so will adjust to Lovenox 1 mg/kg q12h dosing. CBC: Hgb  decreased to 8.5 following IVF replacement - will f/u if trends further off fluids. No bleeding reported. Plt low on admission likely due to chemotherapy administered on 10/19.  ++ IR placed order on 10/31 to hold LMWH until after gastrostomy tube placement on 11/1 --> d/ced order on 10/31 PM;  f/u with IR timing for resuming AC after procedure at 3p today. Inform Dr. Bonner Puna and he will place order for Xarelto ++ ** Dr. Anselm Pancoast contacted, no instruction on resuming anti-coag in Progress note. Do not resume Lovenox yet, cover with IV Heparin, Radiology will round in am, assess any bleed, guide resumption of Lovenox**  11/1 Begin Heparin infusion, no load  Today, 11/1 0515 HL=0.32 at goal, no bleeding or infusion issues per RN.   Goal of Therapy:  Anti-Xa level 0.6-1 units/ml 4hrs after LMWH dose given Monitor platelets by anticoagulation protocol: Yes  Heparin level 0.3-0.7 units/ml   Plan:   Continue Heparin infusion at 1200 units/hr.  Recheck HL in 8 hours  Radiology to assess today if able to resume Lovenox   Monitor SCr, CBC, bleeding   Dorrene German 02/20/2017, 5:56 AM

## 2017-02-20 NOTE — Progress Notes (Signed)
Physical Therapy Treatment Patient Details Name: Kristen Escobar MRN: [id number] DOB: [birth year] Today's Date: 02/20/2017    History of Present Illness 71 yo female admitted with acute PE. Hx of lymphoma, PVD, breast cancer, obesity, hypothyroidism. s/p PEG placement 02/19/17    PT Comments    PEG placed 02/19/17. Pt required Min assist for bed mobility on today. Noted to be a little more dyspneic with ambulation on today. O2 sat 96% 2L [place] O2 at rest, 87-88% on 2L [place] during ambulation. Encouraged pt to walk more with family/nursing (after allowing nursing to assist with setting up O2 if she walks with family). Recommend HHPT f/u if pt will agree. She has previously declined HHPT when suggested by therapist. Recommended RW use for safe ambulation at home until she returns to baseline.   Follow Up Recommendations  Home health PT;Supervision/Assistance - 24 hour (if pt will agree.)     Equipment Recommendations  None recommended by PT    Recommendations for Other Services       Precautions / Restrictions Precautions Precautions: Fall Precaution Comments: monitor O2 sat Restrictions Weight Bearing Restrictions: No    Mobility  Bed Mobility Overal bed mobility: Needs Assistance Bed Mobility: Supine to Sit;Sit to Supine     Supine to sit: Supervision;HOB elevated Sit to supine: Min assist;HOB elevated   General bed mobility comments: Assist for LEs back onto bed. Monitoring of lines.   Transfers Overall transfer level: Needs assistance   Transfers: Sit to/from Stand Sit to Stand: Supervision         General transfer comment: for safety, lines.   Ambulation/Gait Ambulation/Gait assistance: Min guard;Min assist Ambulation Distance (Feet): 125 Feet Assistive device: None (IV pole) Gait Pattern/deviations: Step-through pattern;Decreased stride length;Staggering left;Staggering right;Drifts right/left     General Gait Details: close guard when pt uses IV pole for support.  Intermittent assist given when pt walked ~25 feet without any support. Dyspnea 3/4. 1 brief standing rest break midway. O2 sat 87-88% 2L [place] O2.    Stairs            Wheelchair Mobility    Modified Rankin (Stroke Patients Only)       Balance Overall balance assessment: Needs assistance           Standing balance-Leahy Scale: Fair                              Cognition Arousal/Alertness: Awake/alert Behavior During Therapy: WFL for tasks assessed/performed Overall Cognitive Status: Within Functional Limits for tasks assessed                                        Exercises      General Comments        Pertinent Vitals/Pain Pain Assessment: 0-10 Pain Score: 5  Pain Location: G tube site Pain Descriptors / Indicators: Sore Pain Intervention(s): Monitored during session;Repositioned    Home Living                      Prior Function            PT Goals (current goals can now be found in the care plan section) Progress towards PT goals: Progressing toward goals    Frequency    7X/week      PT Plan Current plan remains appropriate  Co-evaluation              AM-PAC PT "6 Clicks" Daily Activity  Outcome Measure  Difficulty turning over in bed (including adjusting bedclothes, sheets and blankets)?: A Little Difficulty moving from lying on back to sitting on the side of the bed? : A Little Difficulty sitting down on and standing up from a chair with arms (e.g., wheelchair, bedside commode, etc,.)?: A Little Help needed moving to and from a bed to chair (including a wheelchair)?: A Little Help needed walking in hospital room?: A Little Help needed climbing 3-5 steps with a railing? : A Little 6 Click Score: 18    End of Session Equipment Utilized During Treatment: Oxygen Activity Tolerance: Patient tolerated treatment well Patient left: in bed;with call bell/phone within reach;with family/visitor  present   PT Visit Diagnosis: Muscle weakness (generalized) (M62.81);Difficulty in walking, not elsewhere classified (R26.2)     Time: 2876-8115 PT Time Calculation (min) (ACUTE ONLY): 13 min  Charges:  $Gait Training: 8-22 mins                    G Codes:          Weston Anna, MPT Pager: [phone]

## 2017-02-20 NOTE — Progress Notes (Signed)
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 V/Q scan showed findings consistent with acute PE. LE doppler neg for DVT. 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 Patient is likely not a candidate for endoscopic stent placement.  Plan is to consult CT surgery for recommendations. Per Dr. Karleen Hampshire, Dr. Lindi Adie recommends to start TPN for patient while she's NPO.   Significant events:  11/1: G tube placement by IR  11/2: To begin TF today  Today:   Glucose: CBG trending up with increased TPN rate (goal < 150)  Electrolytes: K+ 3.6 improved after replacement yesterday, Mg 1.9 improved after replacement yesterday, CorrCa WNL at 9.22  Renal: SCr down 0.95 (CrCl~60);  LFTs: AST/ALT trending down 56/91, Tbili wnl  TGs: 108 (10/31)  Prealbumin: 6.0 (10/31)  NUTRITIONAL GOALS                                                                                             RD recs: 8185-6314 kcal (23-25 kcal/kg), 95-115 grams protein per day  Clinimix E 5/20 at a goal rate of 80 ml/hr + 20% fat emulsion at 20 ml/hr (for 12 hrs) to provide: 96 g/day protein, 2169 Kcal/day.  PLAN  At 1800 today:  Continue Clinimix E 5/15 at 65 ml/hr since TF initiating today.  20% fat emulsion at 20 ml/hr for 12 hrs.  Continue IVF to 50 ml/hr  TPN to contain standard multivitamins and trace elements.  Adjust SSI to moderate scale q6h   TPN lab panels on Mondays & Thursdays.  F/u daily. Monitor ability to advance TF to goal rate.  Peggyann Juba, PharmD, BCPS Pager: [phone] 02/20/2017,12:44 PM

## 2017-02-20 NOTE — Progress Notes (Signed)
[place] for Heparin post G tube placement Indication: PE  No Known Allergies  Patient Measurements: Height: 5\' 2"  (157.5 cm) Weight: 214 lb 8.1 oz (97.3 kg) IBW/kg (Calculated) : 50.1 Documented height 63 inches, weight 93.6kg  Vital Signs: Temp: 98.8 F (37.1 C) (11/02 0557) Temp Source: Oral (11/02 0557) BP: 152/47 (11/02 0557) Pulse Rate: 80 (11/02 0557)  Labs:  Recent Labs  02/18/17 0514 02/19/17 0441 02/20/17 0515 02/20/17 1330  HGB 8.3* 8.4* 8.8*  --   HCT 24.9* 25.8* 26.3*  --   PLT 131* 166 190  --   LABPROT  --  14.1  --   --   INR  --  1.10  --   --   HEPARINUNFRC  --   --  0.32 0.42  CREATININE 1.49* 1.18* 0.95  --    Estimated Creatinine Clearance: 60 mL/min (by C-G formula based on SCr of 0.95 mg/dL).  Medical History: Past Medical History:  Diagnosis Date  . Arthritis   . Cancer ([place]) 03/2009   breast- rt  . GERD (gastroesophageal reflux disease)   . History of radiation therapy 07/12/10,completed   right breast 60 Gy x30 fx  . Hypertension   . Hypothyroidism   . Obesity   . Peripheral vascular disease ([place]) 1995   PT DEVELOPED CIRCULATION PROBLEMS IN BOTH HANDS AND GANGRENE OF BOTH INDEX FINGERS--REQUIRING AMPUTATION OF THE INDEX FINGERS AND VASCULAR SURGERY.  PT TOLD HER PROBLEMS RELATED TO SMOKING.   NO OTHER PROBLEMS SINCE.  Marland Kitchen Pneumonia   . Thyroid mass    Assessment: 53 yoF admitted on 10/29 with acute on chronic respiratory failure.  Possible PE with elevated D-dimer 10.3. VQ Scan impression most consistent with acute pulmonary embolism. Pharmacy consulted for anticoagulation management.  Initially started on Lovenox 10/29, transitioned to Heparin s/p G tube placement 11/1.   Heparin level therapeutic x 2 on 1200 units/hr  Hgb low but stable, Plts wnl  SCr improved 0.95  No bleeding reported   Goal of Therapy:  Anti-Xa level 0.6-1 units/ml 4hrs after LMWH dose given Monitor platelets by  anticoagulation protocol: Yes  Heparin level 0.3-0.7 units/ml   Plan:   Continue Heparin infusion at 1200 units/hr.  Daily heparin level and CBC  Monitor for signs and symptoms of bleeding or thrombosis  F/u plans for transition to oral/enteral anticoagulation likely 11/3  Peggyann Juba, PharmD, BCPS Pager: [phone] 02/20/2017, 2:03 PM

## 2017-02-20 NOTE — Progress Notes (Signed)
Nutrition Follow-up  DOCUMENTATION CODES:   Obesity unspecified  INTERVENTION:  - Continue TPN at current rate. - Will order Osmolite 1.5 @ 15 mL/hr to increase by 10 mL every 12 hours to reach goal rate of Osmolite 1.5 @ 65 mL/hr. At goal rate, this regimen will provide 2340 kcal, 98 grams of protein, and 1189 mL free water. - Will monitor for need for free water flush. - Communicated with SLP who plans to see pt and family.  - Will adjust TF regimen as needed/warranted.   NUTRITION DIAGNOSIS:   Inadequate oral intake related to inability to eat as evidenced by NPO status.  GOAL:   Patient will meet greater than or equal to 90% of their needs  MONITOR:   TF tolerance, Weight trends, Labs, I & O's, Other (Comment) (TPN regimen)  REASON FOR ASSESSMENT:   Consult New TPN/TNA, Enteral/tube feeding initiation and management  ASSESSMENT:   71 y.o. 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 She relates that for the last 3 weeks she's been coughing every time she drinks liquids, any oncologist office she was found to have a saturation of 87% tachycardic at 114 and a blood pressure 118/48 she was placed into a nasal cannula and her saturations improved to 95%.  11/2 Pt with port and receiving Clinimix E 5/15 to 65 mL/hr with 20% ILE @ 20 mL/hr x12 hours. This regimen is providing 78 grams of protein (82% minimum estimated protein need) and 1588 kcal (73% minimum estimated kcal need). She had PEG placed in IR yesterday evening and reports some soreness when moving and some slight bloating. Husband and daughter at bedside. Provided  information about TF formula, rationale for slow TF rate advancement, and plan for RD to follow-up throughout hospitalization for monitoring of tolerance and to make adjustments as needed. Family very concerned about TF at home; answered all questions and concerns about this and again reminded family that RD will continue to work with pt and family during hospitalization. Weight continues to trend up (likely d/t fluid) and is now +6 lbs/2.8 kg compared to admission.  Medications reviewed; sliding scale Novolog, 25 mcg IV Synthroid/day. Labs reviewed; CBGs:182 and 172 mg/dL, Ca: 7.7 mg/dL, LFTs elevated.  IVF: NS @ 50 mL/hr.     11/1 - Per IR note this AM, tentative plan for PEG placement today.  - Pt is receiving Clinimix E 5/15 @ 50 mL/hr with 20% ILE @ 20 mL/hr x12 hours with plan to continue ILE regimen and increase Clinimix E 5/15 to 65 mL/hr today at 1800.  - Pharmacy note this AM states goal for TPN: Clinimix  5/20 @ 80 mL/hr with 20% ILE @ 20 mL/hr x12 hours.  - This regimen will provide 96 grams of protein and 2170 kcal.  Medications; 2 g IV Mg sulfate x1 run today, 10 mEq IV KCl x4 runs today  Labs; K: 3.1 mmol/L, Mg: 1.6 mg/dL  IVF: NS @ 65 mL/hr.     10/31 - Spoke with pt and husband, who is at bedside. - They were informed that TPN is short-term solution and plan is for placement of G-tube. - No G-tube in  place at this time so will hold off on ordering TF.  - Pt's husband reports nearly all information as pt's voice is hoarse and it is sometimes difficult for her to talk.  - Pt was encouraged by Oncologist to consume nutrition as well as possible at home.  - She was having coughing/choking with all PO intakes but husband continued to provide encouragement.  - Up until Sunday (10/28) pt was consuming 20-30 ounces of liquids per day (water and Ensure) and eating ~10 bites of solid foods (Lean Cuisine meals).  - Pt was receiving IV hydration at the [place] at least weekly  with weekly labs and that K had previously been low.  - Pt began eating bananas and K rose to WDL.  - She has a port and is receiving TPN via port.  - Currently receiving Clinimix E 5/15 @ 40 mL/hr with 20% ILE @ 20 mL/hr x12 hours. - Plan to increase Clinimix E 5/15 to 50 mL/hr and maintain current ILE order tonight.  - This regimen will provide 60 grams of protein and 1332 kcal.   IVF: NS @ 75 mL/hr.    Diet Order:  Diet NPO time specified .TPN (CLINIMIX-E) Adult  EDUCATION NEEDS:   Education needs have been addressed  Skin:  Skin Assessment: Reviewed RN Assessment  Last BM:  10/30  Height:   Ht Readings from Last 1 Encounters:  02/19/17 5\' 2"  (1.575 m)    Weight:   Wt Readings from Last 1 Encounters:  02/19/17 211 lb 9.6 oz (96 kg)    Ideal Body Weight:  52.27 kg  BMI:  Body mass index is 38.7 kg/m.  Estimated Nutritional Needs:   Kcal:  4970-2637 (23-25 kcal/kg)  Protein:  95-115 grams  Fluid:  >/= 2.1 L/day      Jarome Matin, MS, RD, LDN, [facility] Inpatient Clinical Dietitian Pager # [phone] After hours/weekend pager # [phone]

## 2017-02-20 NOTE — Progress Notes (Signed)
PROGRESS NOTE    Kristen Escobar  KVQ:[id number] DOB: 1945-07-18 DOA: 02/16/2017 PCP: Marletta Lor, MD   Brief Narrative: Kristen Escobar is a 71 y.o. 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  Since she was coughing with po intake, SLP eval was done and she underwent MBS showing a esophageal-tracheal fistula.  GI consulted, recommended CT surgery evaluation. Unfortunately CT surgery said, she would benefit from transfer to a tertiary care surgery for surgical repair of the fistula. Updated Dr Lindi Adie. ENT, Dr. Constance Holster has recommended definitive care of pulmonary emboli and chemotherapy prior to surgical management of TE fistula. PEG tube placed 11/1, tube feeding started 11/2.   Assessment & Plan:  Active Problems:   Essential hypertension   Chronic kidney disease   Diffuse large B cell lymphoma (HCC)   AKI (acute kidney injury) ([place])   Acute on chronic respiratory failure with hypoxia (HCC)   Acute respiratory failure with hypoxia (HCC)  Acute respiratory failure with hypoxia: Secondary to pulmonary embolism. Initial concern for aspiration pneumonitis/pneumonia, though she effectively clears airway secretions on MBS and has no CXR infiltrates or fever so unasyn was stopped 10/31.  - Continue heparin for PE, transition to [place] pending renal function once able to administer per tube. Likely 11/3.   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 s/p PEG placement 11/1.  - Discussed with oncology, ENT, CT surgery who recommend referral to subspecialty surgeon at tertiary care center. However, this is not emergent.  - Continue NPO, though would benefit from ice chips sparsely if tolerated to prevent disuse  atrophy. SLP closely involved.  - TPN per pharmacy, can stop TPN once tube feeds are nearer goal rate.  - Tube feed initiation and education per nutrition   DLBCL: s/p 2 cycles of chemo per Dr. Lindi Adie, planning 4 more cycles.  - Per oncology.  AKI on stage II CKD: Resolved - Monitor metabolic panel daily.  Hypertension: At goal - Prn hydralazine.   Elevated LFT: Improving. - Holding statin  Leukocytosis: Infection vs. VTE  vs neulasta. Mild. - Monitor fever curve off abx  Anemia of chronic disease : - Monitor hemoglobin.   Thrombocytopenia: No signs of bleeding. Possibly from chemo.  - Monitor daily, continue anticoagulation  DVT prophylaxis: Therapeutic anticoagulation heparin > DOAC 11/3.  Code Status: Full Family Communication: Husband and daughter at bedside Disposition Plan: Home pending education/titration of tube feeding. Will eventually need subspecialty referral for TE fistula.   Consultants:   Gastroenterology, Dr. Loletha Carrow  ENT, Dr. Constance Holster  Discussed with CT surgery, Dr. Prescott Gum, over the phone.   Oncology, Dr. Lindi Adie  Nutrition, Jarome Matin, RD.    Procedures: venous duplex.   MBS.   Echocardiogram 10/31: - Left ventricle: The cavity size was normal. Wall thickness was   increased in a pattern of mild LVH. Systolic function was normal.   The estimated ejection fraction was in the range of 60% to 65%.   Wall motion was normal; there were no regional wall motion   abnormalities. Doppler parameters are consistent with abnormal   left ventricular relaxation (grade 1 diastolic dysfunction). The   E/e&' ratio is <8, suggesting normal LV filling pressure. - Left atrium: The atrium was mildly dilated. - Right ventricle: The cavity  size was normal. Wall thickness was   normal. The moderator band was prominent. Systolic function was   normal. - Right atrium: The atrium was at the upper limits of normal in   size. - Tricuspid valve: There was trivial  regurgitation. - Pulmonary arteries: PA peak pressure: 27 mm Hg (S). - Inferior vena cava: The vessel was normal in size. The   respirophasic diameter changes were in the normal range (>= 50%),   consistent with normal central venous pressure.  Impressions: - Compared to a prior study in 12/2016, there are no significant   changes.  Antimicrobials: Unasyn 10/29 - 10/31  Subjective: Pain around tube site without bleeding or discharge. No fever, chills. Still with dyspnea on exertion, unchanged.  Objective: BP (!) 140/51 (BP Location: Right Arm)   Pulse 82   Temp 98.4 F (36.9 C) (Oral)   Resp 18   Ht 5\' 2"  (1.575 m)   Wt 97.3 kg (214 lb 8.1 oz)   SpO2 98%   BMI 39.23 kg/m  Gen: Alert, pleasant chronically ill-appearing 71 y.o. female in NAD HEENT: Tacky mucous membranes, fair dentition. Hoarse voice.  Pulm: Non-labored; decreased at bases on supplemental oxygen. CV: Regular rate, no murmur appreciated; distal pulses intact/symmetric GI: + BS; soft, non-tender, non-distended Skin: Left abdomen with mildly tender PEG insertion site without erythema or discharge. Neuro: A&Ox3, CN II-XII without deficits. No aphasia/dysphasia.   CBC:  Recent Labs Lab 02/16/17 1056 02/17/17 0700 02/18/17 0514 02/19/17 0441 02/20/17 0515  WBC 13.2* 12.8* 11.1* 9.3 11.7*  NEUTROABS 11.8*  --  9.3* 7.6  --   HGB 11.0* 8.5* 8.3* 8.4* 8.8*  HCT 33.6* 25.6* 24.9* 25.8* 26.3*  MCV 86.1 86.5 87.4 87.5 86.8  PLT 126* 109* 131* 166 062   Basic Metabolic Panel:  Recent Labs Lab 02/16/17 1056 02/17/17 0700 02/17/17 1838 02/18/17 0514 02/19/17 0441 02/20/17 0515  NA 134* 138  --  139 138 135  K 4.0 3.5  --  3.7 3.1* 3.6  CL  --  105  --  107 104 106  CO2 22 24  --  25 24 22   GLUCOSE 245* 95  --  147* 145* 169*  BUN 41.3* 31*  --  19 14 16   CREATININE 2.6* 1.71*  --  1.49* 1.18* 0.95  CALCIUM 9.2 7.7*  --  8.0* 7.7* 7.7*  MG  --   --  1.8 1.7 1.6* 1.9  PHOS  --   --  3.5 4.0 4.1 3.5     GFR: Estimated Creatinine Clearance: 60 mL/min (by C-G formula based on SCr of 0.95 mg/dL). Liver Function Tests:  Recent Labs Lab 02/16/17 1056 02/17/17 0700 02/18/17 0514 02/19/17 0441 02/20/17 0515  AST 91* 90* 77* 58* 56*  ALT 218* 160* 134* 105* 91*  ALKPHOS 69 50 50 47 47  BILITOT 0.67 0.5 0.5 0.6 0.3  PROT 6.8 5.2* 5.2* 5.2* 5.4*  ALBUMIN 2.6* 2.2* 2.3* 2.1* 2.1*   CBG:  Recent Labs Lab 02/19/17 1908 02/20/17 0013 02/20/17 0554 02/20/17 1159 02/20/17 1728  GLUCAP 167* 182* 172* 149* 151*   Lipid Profile:  Recent Labs  02/18/17 0514  TRIG 108   Radiology Studies: Ir Gastrostomy Tube Mod Sed  Result Date: 02/19/2017 INDICATION: 71 year old with lymphoma and tracheoesophageal fistula. Patient needs a gastrostomy tube in order to avoid the fistula. EXAM: PERCUTANEOUS GASTROSTOMY TUBE WITH FLUOROSCOPIC GUIDANCE Physician: Stephan Minister. Anselm Pancoast, MD MEDICATIONS: Ancef 2 g; Antibiotics were administered within 1 hour of  the procedure. Glucagon 0.5 mg IV ANESTHESIA/SEDATION: Versed 2.0 mg IV; Fentanyl 100 mcg IV Moderate Sedation Time:  37 minutes The patient was continuously monitored during the procedure by the interventional radiology nurse under my direct supervision. FLUOROSCOPY TIME:  Fluoroscopy Time: 5 minutes 6 seconds (98 mGy). COMPLICATIONS: None immediate. PROCEDURE: Informed consent was obtained for a percutaneous gastrostomy tube. The patient was placed on the interventional table. Fluoroscopy demonstrated gas in the transverse colon. An orogastric tube was placed with fluoroscopic guidance without complication. The anterior abdomen was prepped and draped in sterile fashion. Maximal barrier sterile technique was utilized including caps, mask, sterile gowns, sterile gloves, sterile drape, hand hygiene and skin antiseptic. Stomach was inflated with air through the orogastric tube. The skin and subcutaneous tissues were anesthetized with 1% lidocaine. A total of three  SAF-T-PEXY T-fasteners were placed in the stomach using fluoroscopic guidance. Small incision was made between the T-fasteners. Needle was directed into the stomach with fluoroscopic guidance between the T-fasteners. A small amount of contrast was injected to confirm placement in the stomach. Wire was advanced into the stomach. The tract was dilated to accommodate an 18 French peel-away sheath. A 16 French Entuit gastrostomy tube was easily advanced over the wire and a peel-away sheath was removed. The balloon was inflated with 6 mL of saline. Contrast injection confirmed placement in the stomach. The gastrostomy tube was flushed with normal saline. Fluoroscopic images were taken and saved for this procedure. IMPRESSION: Successful fluoroscopic guided percutaneous gastrostomy tube placement. Electronically Signed   By: Markus Daft M.D.   On: 02/19/2017 18:49   Scheduled Meds: . insulin aspart  0-15 Units Subcutaneous Q6H  . levothyroxine  25 mcg Intravenous QAC breakfast  . pantoprazole (PROTONIX) IV  40 mg Intravenous Q24H   Continuous Infusions: . Marland KitchenTPN (CLINIMIX-E) Adult 65 mL/hr at 02/20/17 1724   And  . fat emulsion 240 mL (02/20/17 1724)  . sodium chloride 50 mL/hr at 02/20/17 1201  . feeding supplement (OSMOLITE 1.5 CAL) 1,000 mL (02/20/17 1447)  . heparin 1,200 Units/hr (02/20/17 1305)     LOS: 4 days   Time spent: 25 minutes.   Vance Gather, MD [facility] Pager [phone]  If 7PM-7AM, please contact night-coverage [email or web address] Password [facility] 02/20/2017, 6:44 PM

## 2017-02-20 NOTE — Progress Notes (Signed)
Referring Physician(s): Tanner,Van E.  Supervising Physician: Markus Daft  Patient Status:  [facility] - In-pt  Chief Complaint: Tracheo-esphageal fistula  Subjective: Diarrhea overnight.  Sore at insertion site.   Allergies: Patient has no known allergies.  Medications: Prior to Admission medications   Medication Sig Start Date End Date Taking? Authorizing Provider  allopurinol (ZYLOPRIM) 300 MG tablet Take 1 tablet (300 mg total) by mouth daily. 01/01/17  Yes Nicholas Lose, MD  atorvastatin (LIPITOR) 10 MG tablet TAKE 1 TABLET EVERY DAY 10/06/16  Yes Marletta Lor, MD  chlorpheniramine-HYDROcodone (TUSSIONEX) 10-8 MG/5ML SUER Take 5 mLs by mouth every 12 (twelve) hours as needed for cough. 02/11/17  Yes Tanner, Lyndon Code., PA-C  HYDROcodone-acetaminophen (NORCO) 7.5-325 MG tablet Take 1 tablet by mouth every 6 (six) hours as needed for moderate pain. 12/26/16  Yes Izora Gala, MD  levothyroxine (SYNTHROID, LEVOTHROID) 50 MCG tablet TAKE 1 TABLET BY MOUTH DAILY 09/08/16  Yes Marletta Lor, MD  losartan-hydrochlorothiazide [facility]) 100-25 MG tablet TAKE 1 TABLET BY MOUTH EVERY DAY 12/25/16  Yes Marletta Lor, MD  omeprazole (PRILOSEC) 20 MG capsule TAKE 1 CAPSULE BY MOUTH EVERY DAY 12/24/16  Yes Marletta Lor, MD  ondansetron (ZOFRAN) 8 MG tablet Take 1 tablet (8 mg total) by mouth 2 (two) times daily as needed for refractory nausea / vomiting. Start on day 3 after cyclophosphamide chemotherapy. 01/01/17  Yes Nicholas Lose, MD  predniSONE (DELTASONE) 20 MG tablet Take 3 tablets (60 mg total) by mouth daily. Take on days 1-5 of chemotherapy. 01/01/17  Yes Nicholas Lose, MD  sucralfate (CARAFATE) 1 GM/10ML suspension Take 10 mLs (1 g total) by mouth 4 (four) times daily -  with meals and at bedtime. 12/12/16  Yes Armbruster, Carlota Raspberry, MD  lidocaine (XYLOCAINE) 2 % solution Use as directed 20 mLs in the mouth or throat every 3 (three) hours as needed for mouth pain. Patient not  taking: Reported on 02/16/2017 12/26/16   Izora Gala, MD  lidocaine-prilocaine (EMLA) cream Apply to affected area once Patient not taking: Reported on 02/16/2017 01/01/17   Nicholas Lose, MD  LORazepam (ATIVAN) 0.5 MG tablet Take 1 tablet (0.5 mg total) by mouth every 6 (six) hours as needed (Nausea or vomiting). 01/01/17   Nicholas Lose, MD  magic mouthwash w/lidocaine SOLN Take 5 mLs by mouth 3 (three) times daily as needed for mouth pain. Patient not taking: Reported on 02/16/2017 01/16/17   Nicholas Lose, MD  prochlorperazine (COMPAZINE) 10 MG tablet Take 1 tablet (10 mg total) by mouth every 6 (six) hours as needed (Nausea or vomiting). 01/01/17   Nicholas Lose, MD  promethazine (PHENERGAN) 25 MG suppository Place 1 suppository (25 mg total) rectally every 6 (six) hours as needed for nausea or vomiting. Patient not taking: Reported on 01/20/2017 12/26/16   Izora Gala, MD     Vital Signs: BP (!) 152/47 (BP Location: Right Arm)   Pulse 80   Temp 98.8 F (37.1 C) (Oral)   Resp 20   Ht 5\' 2"  (1.575 m)   Wt 211 lb 9.6 oz (96 kg)   SpO2 99%   BMI 38.70 kg/m   Physical Exam  NAD, alert Abd:  Gastrostomy in place.  Insertion site c/d/i.  Saf-T-Pexy fasterners in place. Bumper appropriately placed at skin.  Dressing replaced.   Imaging: Dg Chest 2 View  Result Date: 02/16/2017 CLINICAL DATA:  Dyspnea on exertion. Hypoxia. Progressive shortness breath for 2 days. Esophageal cancer. EXAM: CHEST  2 VIEW COMPARISON:  Chest film associated with Port-A-Cath insertion 01/14/2017. PET scan 12/08/2016. FINDINGS: Heart size is normal. Lung volumes are low. There is no edema or effusion. No focal airspace disease present. Aortic atherosclerosis is noted. A right IJ Port-A-Cath is stable. Surgical clips are noted in the right breast and axilla. The upper abdomen is unremarkable. IMPRESSION: 1. No acute cardiopulmonary disease. 2. Low lung volumes. Electronically Signed   By: San Morelle M.D.    On: 02/16/2017 13:05   Ir Gastrostomy Tube Mod Sed  Result Date: 02/19/2017 INDICATION: 71 year old with lymphoma and tracheoesophageal fistula. Patient needs a gastrostomy tube in order to avoid the fistula. EXAM: PERCUTANEOUS GASTROSTOMY TUBE WITH FLUOROSCOPIC GUIDANCE Physician: Stephan Minister. Anselm Pancoast, MD MEDICATIONS: Ancef 2 g; Antibiotics were administered within 1 hour of the procedure. Glucagon 0.5 mg IV ANESTHESIA/SEDATION: Versed 2.0 mg IV; Fentanyl 100 mcg IV Moderate Sedation Time:  37 minutes The patient was continuously monitored during the procedure by the interventional radiology nurse under my direct supervision. FLUOROSCOPY TIME:  Fluoroscopy Time: 5 minutes 6 seconds (98 mGy). COMPLICATIONS: None immediate. PROCEDURE: Informed consent was obtained for a percutaneous gastrostomy tube. The patient was placed on the interventional table. Fluoroscopy demonstrated gas in the transverse colon. An orogastric tube was placed with fluoroscopic guidance without complication. The anterior abdomen was prepped and draped in sterile fashion. Maximal barrier sterile technique was utilized including caps, mask, sterile gowns, sterile gloves, sterile drape, hand hygiene and skin antiseptic. Stomach was inflated with air through the orogastric tube. The skin and subcutaneous tissues were anesthetized with 1% lidocaine. A total of three SAF-T-PEXY T-fasteners were placed in the stomach using fluoroscopic guidance. Small incision was made between the T-fasteners. Needle was directed into the stomach with fluoroscopic guidance between the T-fasteners. A small amount of contrast was injected to confirm placement in the stomach. Wire was advanced into the stomach. The tract was dilated to accommodate an 18 French peel-away sheath. A 16 French Entuit gastrostomy tube was easily advanced over the wire and a peel-away sheath was removed. The balloon was inflated with 6 mL of saline. Contrast injection confirmed placement in the  stomach. The gastrostomy tube was flushed with normal saline. Fluoroscopic images were taken and saved for this procedure. IMPRESSION: Successful fluoroscopic guided percutaneous gastrostomy tube placement. Electronically Signed   By: Markus Daft M.D.   On: 02/19/2017 18:49   Nm Pulmonary Perf And Vent  Addendum Date: 02/16/2017   ADDENDUM REPORT: 02/16/2017 20:22 ADDENDUM: Initial attempts to contact Hospitalists by pager at [place]. Ultimately, Critical Value/emergent results were called by telephone at the time of interpretation on 02/16/2017 at 8:20 pm to Nurse [facility], who verbally acknowledged these results. Electronically Signed   By: Suzy Bouchard M.D.   On: 02/16/2017 20:22   Result Date: 02/16/2017 CLINICAL DATA:  Elevated D-dimer. Esophageal carcinoma. Elevated white count. Short of breath. EXAM: NUCLEAR MEDICINE VENTILATION - PERFUSION LUNG SCAN TECHNIQUE: Ventilation images were obtained in multiple projections using inhaled aerosol Tc-24m DTPA. Perfusion images were obtained in multiple projections after intravenous injection of Tc-42m MAA. RADIOPHARMACEUTICALS:  28.8 mCi Technetium-65m DTPA aerosol inhalation and 4.1 mCi Technetium-18m MAA IV COMPARISON:  Chest radiograph 02/16/2017 FINDINGS: Ventilation: Informed ventilation per Perfusion: There are subtle wedge-shaped perfusion defects in the LEFT RIGHT upper lobe. Smaller defects at the LEFT and RIGHT lung base posteriorly. These peripheral perfusion defects are unmatched to ventilation. IMPRESSION: Bilateral un matched wedge-shaped peripheral perfusion defects most consistent with acute pulmonary embolism. Electronically Signed:  By: Suzy Bouchard M.D. On: 02/16/2017 19:50   Dg Swallowing Func-speech Pathology  Result Date: 02/17/2017 CLINICAL DATA:  71 year old female diagnosed with Lymphoma including a mass at the left tracheoesophageal groove near the thyroid. Multiple imaging guided biopsies were performed, but ultimately a  surgical biopsy of the mass was necessary on 12/26/2016 for diagnosis. The patient is now status post chemotherapy, with an unexplained cough, coughing with p.o. intake. EXAM: MODIFIED BARIUM SWALLOW TECHNIQUE: Different consistencies of barium were administered orally to the patient by the Speech Pathologist. Imaging of the pharynx was performed in the lateral projection. FLUOROSCOPY TIME:  Fluoroscopy Time:  0 minutes 54 seconds Radiation Exposure Index (if provided by the fluoroscopic device): 1.8mG y Number of Acquired Spot Images: 0 COMPARISON:  Neck CT 11/21/2016. FINDINGS: Nectar thick liquid- the patient is able to swallow liquid into the cervical esophagus, but with each swallow there is barium extension from the cervical esophagus (C5/C6 level) through the posterior wall of the trachea into the tracheal lumen. This occurs about 18 mm below the level of the glottis (series 5, image 4). 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 PO barium liquid freely passed through the fistula and into the airway with each swallow. Study discussed by telephone with Dr. Hosie Poisson on 02/17/2017 at 1245 hours. Please refer to the Speech Pathologists report for complete details and recommendations. Electronically Signed   By: Genevie Ann M.D.   On: 02/17/2017 13:28    Labs:  CBC:  Recent Labs  02/17/17 0700 02/18/17 0514 02/19/17 0441 02/20/17 0515  WBC 12.8* 11.1* 9.3 11.7*  HGB 8.5* 8.3* 8.4* 8.8*  HCT 25.6* 24.9* 25.8* 26.3*  PLT 109* 131* 166 190    COAGS:  Recent Labs  12/01/16 0823 01/14/17 0745 02/19/17 0441  INR 0.94 1.02 1.10  APTT 25 24  --     BMP:  Recent Labs  02/17/17 0700 02/18/17 0514 02/19/17 0441 02/20/17 0515  NA 138 139 138 135  K 3.5 3.7 3.1* 3.6  CL 105 107 104 106  CO2 24 25 24 22   GLUCOSE 95 147* 145* 169*  BUN 31* 19 14 16   CALCIUM 7.7* 8.0* 7.7* 7.7*  CREATININE  1.71* 1.49* 1.18* 0.95  GFRNONAA 29* 34* 46* 59*  GFRAA 34* 40* 53* >60    LIVER FUNCTION TESTS:  Recent Labs  02/17/17 0700 02/18/17 0514 02/19/17 0441 02/20/17 0515  BILITOT 0.5 0.5 0.6 0.3  AST 90* 77* 58* 56*  ALT 160* 134* 105* 91*  ALKPHOS 50 50 47 47  PROT 5.2* 5.2* 5.2* 5.4*  ALBUMIN 2.2* 2.3* 2.1* 2.1*    Assessment and Plan: Tracheo-esophageal fistual Gastrostomy successfully placed.  Fasterners remain intact.  Bumper appropriately placed at skin.  G-tube ready for use.  IR available if needed.   Electronically Signed: Docia Barrier, PA 02/20/2017, 10:22 AM   I spent a total of 15 Minutes at the the patient's bedside AND on the patient's hospital floor or unit, greater than 50% of which was counseling/coordinating care for tracheo-esophageal fistula

## 2017-02-21 LAB — GLUCOSE, CAPILLARY
GLUCOSE-CAPILLARY: 118 mg/dL — AB (ref 65–99)
GLUCOSE-CAPILLARY: 159 mg/dL — AB (ref 65–99)
Glucose-Capillary: 159 mg/dL — ABNORMAL HIGH (ref 65–99)
Glucose-Capillary: 166 mg/dL — ABNORMAL HIGH (ref 65–99)

## 2017-02-21 LAB — CBC
HCT: 26.1 % — ABNORMAL LOW (ref 36.0–46.0)
HEMOGLOBIN: 8.7 g/dL — AB (ref 12.0–15.0)
MCH: 29.2 pg (ref 26.0–34.0)
MCHC: 33.3 g/dL (ref 30.0–36.0)
MCV: 87.6 fL (ref 78.0–100.0)
Platelets: 237 10*3/uL (ref 150–400)
RBC: 2.98 MIL/uL — AB (ref 3.87–5.11)
RDW: 18.3 % — ABNORMAL HIGH (ref 11.5–15.5)
WBC: 11.6 10*3/uL — AB (ref 4.0–10.5)

## 2017-02-21 LAB — HEPARIN LEVEL (UNFRACTIONATED): Heparin Unfractionated: 0.34 IU/mL (ref 0.30–0.70)

## 2017-02-21 MED ORDER — RIVAROXABAN 15 MG PO TABS
15.0000 mg | ORAL_TABLET | Freq: Two times a day (BID) | ORAL | Status: DC
  Administered 2017-02-21 – 2017-02-23 (×4): 15 mg
  Filled 2017-02-21 (×4): qty 1

## 2017-02-21 MED ORDER — ELDERTONIC PO ELIX
15.0000 mL | ORAL_SOLUTION | Freq: Every day | ORAL | Status: DC
  Administered 2017-02-22 – 2017-02-23 (×2): 15 mL
  Filled 2017-02-21 (×2): qty 15

## 2017-02-21 NOTE — Progress Notes (Addendum)
ANTICOAGULATION CONSULT NOTE  Pharmacy Consult for Heparin --> Xarelto Indication: PE  No Known Allergies  Patient Measurements: Height: 5\' 2"  (157.5 cm) Weight: 217 lb 6 oz (98.6 kg) IBW/kg (Calculated) : 50.1 Documented height 63 inches, weight 93.6kg  Vital Signs: Temp: 98.8 F (37.1 C) (11/03 0627) Temp Source: Oral (11/03 0627) BP: 106/46 (11/03 0627) Pulse Rate: 87 (11/03 0627)  Labs:  Recent Labs  02/19/17 0441 02/20/17 0515 02/20/17 1330 02/21/17 0610  HGB 8.4* 8.8*  --  8.7*  HCT 25.8* 26.3*  --  26.1*  PLT 166 190  --  237  LABPROT 14.1  --   --   --   INR 1.10  --   --   --   HEPARINUNFRC  --  0.32 0.42 0.34  CREATININE 1.18* 0.95  --   --    Estimated Creatinine Clearance: 60.5 mL/min (by C-G formula based on SCr of 0.95 mg/dL).  Medical History: Past Medical History:  Diagnosis Date  . Arthritis   . Cancer ([place]) 03/2009   breast- rt  . GERD (gastroesophageal reflux disease)   . History of radiation therapy 07/12/10,completed   right breast 60 Gy x30 fx  . Hypertension   . Hypothyroidism   . Obesity   . Peripheral vascular disease ([place]) 1995   PT DEVELOPED CIRCULATION PROBLEMS IN BOTH HANDS AND GANGRENE OF BOTH INDEX FINGERS--REQUIRING AMPUTATION OF THE INDEX FINGERS AND VASCULAR SURGERY.  PT TOLD HER PROBLEMS RELATED TO SMOKING.   NO OTHER PROBLEMS SINCE.  Marland Kitchen Pneumonia   . Thyroid mass    Assessment: 74 yoF admitted on 10/29 with acute on chronic respiratory failure.  Possible PE with elevated D-dimer 10.3. VQ Scan impression most consistent with acute pulmonary embolism. Pharmacy consulted for anticoagulation management.  Initially started on Lovenox 10/29, transitioned to Heparin s/p G tube placement 11/1.   Heparin level remains therapeutic on 1200 units/hr  Hgb low but stable, Plts wnl  SCr improved 0.95  No bleeding reported  Goal of Therapy:  Heparin level 0.3-0.7 units/ml Monitor platelets by anticoagulation protocol: Yes     Plan:   Continue Heparin infusion at 1200 units/hr.  Daily heparin level and CBC  Monitor for signs and symptoms of bleeding or thrombosis  F/u plans for transition to oral/enteral anticoagulation. Eliquis or Xarelto both can be administered via G-tube.  Hershal Coria, PharmD, BCPS Pager: [phone] 02/21/2017 8:57 AM   ADDENDUM: 02/21/2017 5:26 PM Consulted to transition patient to Xarelto.  Plan:  Stop heparin infusion. Start Xarelto 15 mg BID with meal per tube x 21 days then switch to 20 mg per tube daily with supper on 11/24.  For gastric feeding tube administration, the tablets may be crushed and mixed in 50 mL of water; administer the suspension within 4 hours of preparation and follow administration of the 15 mg and 20 mg tablets immediately with enteral feeding.  Will provide education prior to discharge.  Hershal Coria, PharmD Pager: [phone] 02/21/2017

## 2017-02-21 NOTE — Progress Notes (Signed)
PROGRESS NOTE    Linday Rhodes  QMV:[id number] DOB: July 27, 1945 DOA: 02/16/2017 PCP: Marletta Lor, MD   Brief Narrative: Alexcia Schools is a 71 y.o. 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  Since she was coughing with po intake, SLP eval was done and she underwent MBS showing a esophageal-tracheal fistula.  GI consulted, recommended CT surgery evaluation. Unfortunately CT surgery said, she would benefit from transfer to a tertiary care surgery for surgical repair of the fistula. Updated Dr Lindi Adie. ENT, Dr. Constance Holster has recommended definitive care of pulmonary emboli and chemotherapy prior to surgical management of TE fistula. PEG tube placed 11/1, tube feeding started 11/2.   Assessment & Plan:  Active Problems:   Essential hypertension   Chronic kidney disease   Diffuse large B cell lymphoma (HCC)   AKI (acute kidney injury) ([place])   Acute on chronic respiratory failure with hypoxia (HCC)   Acute respiratory failure with hypoxia (HCC)  Acute respiratory failure with hypoxia: Secondary to pulmonary embolism. Initial concern for aspiration pneumonitis/pneumonia, though she effectively clears airway secretions on MBS and has no CXR infiltrates or fever so unasyn was stopped 10/31.  - Heparin gtt transitioned to xarelto 11/3. Appreciate pharmacy assistance with education. - Will need to ambulate with pulse oximetry prior to discharge.   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 s/p PEG placement 11/1.  - Discussed with oncology, ENT, CT surgery who recommend referral to subspecialty surgeon at tertiary care center. However, this is not emergent.  - Continue NPO, though would benefit from ice chips  sparsely if tolerated to prevent disuse atrophy. SLP closely involved. Further oral care per SLP. - TPN per pharmacy, likely DC 11/4 once tube feeds are nearer goal rate.  - Tube feed initiation and education per nutrition: Slow titration hopefully to goal 11/4 late in the day, so far tolerating well. Interested in bolus feeds at home.   DLBCL: s/p 2 cycles of chemo per Dr. Lindi Adie, planning 4 more cycles.  - Per oncology.  AKI on stage II CKD: Resolved - Monitor metabolic panel daily.  Hypertension: At goal - Prn hydralazine.   Elevated LFT: Improving. - Holding statin  Leukocytosis: Infection vs. VTE  vs neulasta. Mild. - Monitor fever curve off abx  Anemia of chronic disease : - Monitor hemoglobin.   Thrombocytopenia: No signs of bleeding. Possibly from chemo.  - Monitor daily, continue anticoagulation  DVT prophylaxis: Therapeutic anticoagulation heparin > xarelto 11/3. Code Status: Full Family Communication: Husband at bedside Disposition Plan: Home pending education/titration of tube feeding, with slow titration expect DC 11/5 if remains stable. Will eventually need subspecialty referral for TE fistula.   Consultants:   Gastroenterology, Dr. Loletha Carrow  ENT, Dr. Constance Holster  Discussed with CT surgery, Dr. Prescott Gum, over the phone.   Oncology, Dr. Lindi Adie  Nutrition, Jarome Matin, RD.    Procedures: venous duplex.   MBS.   Echocardiogram 10/31: - Left ventricle: The cavity size was normal. Wall thickness was   increased in a pattern of mild LVH. Systolic function was normal.   The estimated ejection fraction was in the range of 60% to 65%.   Wall motion was normal; there were no regional wall motion   abnormalities. Doppler parameters are consistent with abnormal  left ventricular relaxation (grade 1 diastolic dysfunction). The   E/e&' ratio is <8, suggesting normal LV filling pressure. - Left atrium: The atrium was mildly dilated. - Right ventricle: The cavity  size was normal. Wall thickness was   normal. The moderator band was prominent. Systolic function was   normal. - Right atrium: The atrium was at the upper limits of normal in   size. - Tricuspid valve: There was trivial regurgitation. - Pulmonary arteries: PA peak pressure: 27 mm Hg (S). - Inferior vena cava: The vessel was normal in size. The   respirophasic diameter changes were in the normal range (>= 50%),   consistent with normal central venous pressure.  Impressions: - Compared to a prior study in 12/2016, there are no significant   changes.  Antimicrobials: Unasyn 10/29 - 10/31  Subjective: Pain around tube site without bleeding or discharge is mild and somewhat improving. No changes since starting tube feeds and increasing, currently at 35cc/hr. Coughing up white secretions still. No fever or feeling of choking. Work with SLP and has been swishing/spitting with improved feelings of oral care. Wants to take DOAC once daily in long term, so opts for xarelto. Husband thinks she's gaining strength today, speaking louder.   Objective: BP (!) 131/36 (BP Location: Right Arm)   Pulse 87   Temp 98 F (36.7 C) (Oral)   Resp 18   Ht 5\' 2"  (1.575 m)   Wt 98.6 kg (217 lb 6 oz)   SpO2 99%   BMI 39.76 kg/m  Gen: Alert, pleasant chronically ill-appearing 71 y.o. female in NAD HEENT: MMM without ulceration, fair dentition. No leukoplakia noted. Hoarse voice.  Pulm: Non-labored; decreased at bases on supplemental oxygen. CV: Regular rate, no murmur appreciated; distal pulses intact/symmetric GI: + BS; soft, non-tender, non-distended Skin: Left abdomen with mildly tender PEG insertion site without erythema or discharge. No other tenderness.  Neuro: A&Ox3, CN II-XII without deficits. No aphasia/dysphasia.   CBC:  Recent Labs Lab 02/16/17 1056 02/17/17 0700 02/18/17 0514 02/19/17 0441 02/20/17 0515 02/21/17 0610  WBC 13.2* 12.8* 11.1* 9.3 11.7* 11.6*  NEUTROABS 11.8*  --  9.3*  7.6  --   --   HGB 11.0* 8.5* 8.3* 8.4* 8.8* 8.7*  HCT 33.6* 25.6* 24.9* 25.8* 26.3* 26.1*  MCV 86.1 86.5 87.4 87.5 86.8 87.6  PLT 126* 109* 131* 166 190 989   Basic Metabolic Panel:  Recent Labs Lab 02/16/17 1056 02/17/17 0700 02/17/17 1838 02/18/17 0514 02/19/17 0441 02/20/17 0515  NA 134* 138  --  139 138 135  K 4.0 3.5  --  3.7 3.1* 3.6  CL  --  105  --  107 104 106  CO2 22 24  --  25 24 22   GLUCOSE 245* 95  --  147* 145* 169*  BUN 41.3* 31*  --  19 14 16   CREATININE 2.6* 1.71*  --  1.49* 1.18* 0.95  CALCIUM 9.2 7.7*  --  8.0* 7.7* 7.7*  MG  --   --  1.8 1.7 1.6* 1.9  PHOS  --   --  3.5 4.0 4.1 3.5   GFR: Estimated Creatinine Clearance: 60.5 mL/min (by C-G formula based on SCr of 0.95 mg/dL). Liver Function Tests:  Recent Labs Lab 02/16/17 1056 02/17/17 0700 02/18/17 0514 02/19/17 0441 02/20/17 0515  AST 91* 90* 77* 58* 56*  ALT 218* 160* 134* 105* 91*  ALKPHOS 69 50 50 47 47  BILITOT 0.67 0.5 0.5 0.6 0.3  PROT 6.8 5.2*  5.2* 5.2* 5.4*  ALBUMIN 2.6* 2.2* 2.3* 2.1* 2.1*   CBG:  Recent Labs Lab 02/20/17 1159 02/20/17 1728 02/21/17 0017 02/21/17 0627 02/21/17 1213  GLUCAP 149* 151* 159* 159* 166*   Radiology Studies: Ir Gastrostomy Tube Mod Sed  Result Date: 02/19/2017 INDICATION: 71 year old with lymphoma and tracheoesophageal fistula. Patient needs a gastrostomy tube in order to avoid the fistula. EXAM: PERCUTANEOUS GASTROSTOMY TUBE WITH FLUOROSCOPIC GUIDANCE Physician: Stephan Minister. Anselm Pancoast, MD MEDICATIONS: Ancef 2 g; Antibiotics were administered within 1 hour of the procedure. Glucagon 0.5 mg IV ANESTHESIA/SEDATION: Versed 2.0 mg IV; Fentanyl 100 mcg IV Moderate Sedation Time:  37 minutes The patient was continuously monitored during the procedure by the interventional radiology nurse under my direct supervision. FLUOROSCOPY TIME:  Fluoroscopy Time: 5 minutes 6 seconds (98 mGy). COMPLICATIONS: None immediate. PROCEDURE: Informed consent was obtained for a  percutaneous gastrostomy tube. The patient was placed on the interventional table. Fluoroscopy demonstrated gas in the transverse colon. An orogastric tube was placed with fluoroscopic guidance without complication. The anterior abdomen was prepped and draped in sterile fashion. Maximal barrier sterile technique was utilized including caps, mask, sterile gowns, sterile gloves, sterile drape, hand hygiene and skin antiseptic. Stomach was inflated with air through the orogastric tube. The skin and subcutaneous tissues were anesthetized with 1% lidocaine. A total of three [id number] T-fasteners were placed in the stomach using fluoroscopic guidance. Small incision was made between the T-fasteners. Needle was directed into the stomach with fluoroscopic guidance between the T-fasteners. A small amount of contrast was injected to confirm placement in the stomach. Wire was advanced into the stomach. The tract was dilated to accommodate an 18 French peel-away sheath. A 16 French Entuit gastrostomy tube was easily advanced over the wire and a peel-away sheath was removed. The balloon was inflated with 6 mL of saline. Contrast injection confirmed placement in the stomach. The gastrostomy tube was flushed with normal saline. Fluoroscopic images were taken and saved for this procedure. IMPRESSION: Successful fluoroscopic guided percutaneous gastrostomy tube placement. Electronically Signed   By: Markus Daft M.D.   On: 02/19/2017 18:49   Scheduled Meds: . [START ON 02/22/2017] geriatric multivitamins-minerals  15 mL Per Tube Daily  . insulin aspart  0-15 Units Subcutaneous Q6H  . levothyroxine  25 mcg Intravenous QAC breakfast  . pantoprazole (PROTONIX) IV  40 mg Intravenous Q24H   Continuous Infusions: . Marland KitchenTPN (CLINIMIX-E) Adult 65 mL/hr at 02/20/17 1724  . sodium chloride 50 mL/hr at 02/20/17 1201  . feeding supplement (OSMOLITE 1.5 CAL) 1,000 mL (02/21/17 0930)  . heparin 1,200 Units/hr (02/21/17 1134)     LOS: 5  days   Time spent: 25 minutes.   Vance Gather, MD [facility] Pager [phone]  If 7PM-7AM, please contact night-coverage [email or web address] Password [email or web address] 02/21/2017, 5:18 PM

## 2017-02-21 NOTE — Progress Notes (Signed)
PHARMACY - ADULT TOTAL PARENTERAL NUTRITION CONSULT NOTE   Pharmacy Consult for TPN Indication: NPO  Patient Measurements: IBW: 52 kg AdjBW: 63 kg Height: 63 inches Height: 5\' 2"  (157.5 cm) Weight: 217 lb 6 oz (98.6 kg) IBW/kg (Calculated) : 50.1  Usual Weight:   Insulin Requirements: 9 unit SSI  Current Nutrition: TPN at goal + TF (advancing q12h to goal of 65 ml/hr, currently at 35 ml/hr)  IVF: NS at 50 ml/hr  Central access: PAC TPN start date: 02/17/17  ASSESSMENT                                                                                                          HPI: Patient's a 71 y.o F with high grade large cell lymphoma and with severe lymphadenopathy in the neck currently undergoing chemotherapy, presented to [facility] on 10/28 from the [facility] for evaluation of SOB. V/Q scan showed findings consistent with acute PE. LE doppler neg for DVT. 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 Patient is likely not a candidate for endoscopic stent placement.  Plan is to consult CT surgery for recommendations. Per Dr. Karleen Hampshire, Dr. Lindi Adie recommends to start TPN for patient while she's NPO.  Significant events:  11/1: G tube placement by IR  11/2: Started TF, advancing by 10 ml/hr every 12 hours 11/3 patient tolerating TF advancements  Today:   Glucose: CBG slightly above goal (goal < 150), SSI correcting  Electrolytes: no labs this morning, lytes improved yesterday after replacement orders  Renal: SCr down 0.95 (CrCl~60);  LFTs: AST/ALT trending down yesterday, Tbili was wnl  TGs: 108 (10/31)  Prealbumin: 6.0 (10/31)  NUTRITIONAL GOALS                                                                                             RD recs: 5035-4656 kcal (23-25 kcal/kg), 95-115 grams protein per day  Clinimix E 5/20 at a goal rate of 80 ml/hr + 20% fat emulsion at 20 ml/hr (for 12 hrs) to provide: 96 g/day protein, 2169 Kcal/day.  PLAN  For today, continue Clinimix E 5/15 at 65 ml/hr while TF at 35 ml/hr.  At 9pm tonight, tube feeds will be advanced to 45 ml/hr which will meet 71% of protein goal and 74% of kcal goal and patient has been tolerating rate advancements thus far.  Per protocol, when the patient gets to ? 60% goal calories from enteral sources, TPN will be discontinued.  With completion of current TPN bag at 6pm tonight, will not order further TPN to avoid overfeeding.  Start MVI with mineral solution per tube daily.  Discontinue TPN lab panel.  Will defer further management of SSI to MD.  Thank you for the consult.  Hershal Coria, PharmD, BCPS Pager: [phone] 02/21/2017 11:20 AM

## 2017-02-21 NOTE — Progress Notes (Signed)
Increased TF 10 ml. Running 35 ml/h. Denies pain or discomfort. abd soft non tender. Tolerating well

## 2017-02-21 NOTE — Discharge Instructions (Signed)

## 2017-02-22 DIAGNOSIS — J860 Pyothorax with fistula: Secondary | ICD-10-CM | POA: Diagnosis present

## 2017-02-22 DIAGNOSIS — I2699 Other pulmonary embolism without acute cor pulmonale: Secondary | ICD-10-CM | POA: Diagnosis present

## 2017-02-22 MED ORDER — OSMOLITE 1.5 CAL PO LIQD
120.0000 mL | Freq: Four times a day (QID) | ORAL | Status: DC
  Administered 2017-02-22 – 2017-02-23 (×4): 120 mL
  Filled 2017-02-22 (×6): qty 237

## 2017-02-22 NOTE — Plan of Care (Signed)
Pt verbalizes that she is comfortable doing tube feeds herself. Pt observed performing tube feeds and had no questions.

## 2017-02-22 NOTE — Progress Notes (Signed)
Assumed care from previous RN. Agree with previous assessment. Condition stable and unchanged. Eulas Post, RN

## 2017-02-22 NOTE — Progress Notes (Signed)
Performed gravity TF teaching with pt and husband. Pt and husband able to administer TF themselves. Will continue to educate

## 2017-02-22 NOTE — Plan of Care (Signed)
Cont to mon 

## 2017-02-22 NOTE — Progress Notes (Signed)
Husband and patient participated in gravity bolus tube feeding. Administered themselves. Education continues. Eulas Post, RN

## 2017-02-22 NOTE — Progress Notes (Signed)
Nutrition Follow-up  DOCUMENTATION CODES:   Obesity unspecified  INTERVENTION:   Transition to bolus feeds: -Initiate 1/2 carton (120 ml) Osmolite 1.5 QID (2 cartons total). -Recommend free water flush with 60 ml before and after each bolus (480 ml total). -This will provide 710 kcal, 29g protein and 842 ml H2O.  Goal rate:  -6 cartons of Osmolite 1.5 daily (480 ml TID). -Free water flushes 60 ml before and after each bolus (360 ml total) -Recommend additional free water administration of 200 ml TID -This provides 2130 kcal (98% of needs), 89g protein (94% of needs) and 2047 ml H2O.  Will continue to monitor for tolerance and advancement.  NUTRITION DIAGNOSIS:   Inadequate oral intake related to inability to eat as evidenced by NPO status.  Ongoing.  GOAL:   Patient will meet greater than or equal to 90% of their needs  Progressing.  MONITOR:   TF tolerance, Weight trends, Labs, I & O's, Other (Comment)(TPN regimen)  ASSESSMENT:   71 y.o. 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 She relates that for the last 3 weeks she's been coughing every time she drinks liquids, any oncologist office she was found to have a saturation of 87% tachycardic at 114 and a blood pressure 118/48 she was placed into a nasal cannula and her saturations improved to 95%.  TPN discontinued 11/3 per pharmacy note.  Patient in room with husband. Both pt and pt's husband express that they would like TF to be transitioned to bolus feeds for ease at home. Pt is currently tolerating Osmolite 1.5 @ 55 ml/hr. Pt still with coughing and secretions. Pt  states she is tolerating the tube feeds with no issues. No signs of refeeding syndrome at this time.  Explained transition process to patient and pt's husband. Will stop continuous infusion and initiate bolus feeds this afternoon with 120 ml of Osmolite 1.5. This will allow time for nursing instruction prior to discharge.   Goal will eventually be 6 cartons total. Explained that the advancement may take a few days d/t higher volumes at feeding times.   Medications: Geriatric liquid MVI daily, IV Protonix daily  Labs reviewed: CBGs: 118-166 Mg/Phos WNL  11/2: - Continue TPN at current rate. - Will order Osmolite 1.5 @ 15 mL/hr to increase by 10 mL every 12 hours to reach goal rate of Osmolite 1.5 @ 65 mL/hr. At goal rate, this regimen will provide 2340 kcal, 98 grams of protein, and 1189 mL free water. - Will monitor for need for free water flush. - Communicated with SLP who plans to see pt and family.  - Will adjust TF regimen as needed/warranted.   11/1: - Per IR note this AM, tentative plan for PEG placement today.  - Pt is receiving Clinimix E 5/15 @ 50 mL/hr with 20% ILE @ 20 mL/hr x12 hours with plan to continue ILE regimen and increase Clinimix E 5/15 to 65 mL/hr today at 1800.   Diet Order:  Diet NPO time specified  EDUCATION NEEDS:   Education needs have been addressed  Skin:  Skin Assessment: Reviewed RN Assessment  Last BM:  11/3  Height:   Ht  Readings from Last 1 Encounters:  02/19/17 5\' 2"  (1.575 m)    Weight:   Wt Readings from Last 1 Encounters:  02/22/17 217 lb 2.5 oz (98.5 kg)    Ideal Body Weight:  52.27 kg  BMI:  Body mass index is 39.72 kg/m.  Estimated Nutritional Needs:   Kcal:  4235-3614 (23-25 kcal/kg)  Protein:  95-115 grams  Fluid:  >/= 2.1 L/day  Clayton Bibles, MS, RD, LDN [place] Dietitian Pager: [phone] After Hours Pager: [phone]

## 2017-02-22 NOTE — Progress Notes (Signed)
Removed O2 at 1230. Pt remained above 96% from 1200-1400. No SOB. Able to ambulate to toilet with SPO2 above 95%.

## 2017-02-22 NOTE — Progress Notes (Addendum)
PROGRESS NOTE    Kristen Escobar  UVO:[id number] DOB: Nov 28, 1945 DOA: 02/16/2017 PCP: Marletta Lor, MD   Brief Narrative: Kristen Escobar is a 71 y.o. 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  Since she was coughing with po intake, SLP eval was done and she underwent MBS showing a esophageal-tracheal fistula.  GI consulted, recommended CT surgery evaluation. Unfortunately CT surgery said, she would benefit from transfer to a tertiary care surgery for surgical repair of the fistula. Updated Dr Lindi Adie. ENT, Dr. Constance Holster has recommended definitive care of pulmonary emboli and chemotherapy prior to surgical management of TE fistula. PEG tube placed 11/1, tube feeding started 11/2.   Assessment & Plan:  Principal Problem:   Tracheoesophageal fistula, acquired ([place]) Active Problems:   Essential hypertension   Chronic kidney disease   Diffuse large B cell lymphoma ([place])   AKI (acute kidney injury) ([place])   Acute on chronic respiratory failure with hypoxia (HCC)   Acute respiratory failure with hypoxia (HCC)   Pulmonary embolism (HCC)  Acute respiratory failure with hypoxia: Secondary to pulmonary embolism. Initial concern for aspiration pneumonitis/pneumonia, though she effectively clears airway secretions on MBS and has no CXR infiltrates or fever so unasyn was stopped 10/31.  - Transitioned to xarelto 11/3, education provided by pharmacy. No bleeding.  - Hypoxia has resolved.   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 s/p PEG placement 11/1.  - Discussed with oncology, ENT, CT surgery who recommend referral to subspecialty surgeon at tertiary care center. However, this is not emergent.  - Continue NPO,  though would benefit from ice chips sparsely if tolerated to prevent disuse atrophy. SLP closely involved. Further oral care per SLP. - Tube feed initiation and education per nutrition: Slow titration, now converting to bolus feeds: low volume QID >> planning goal volume TID prior to discharge.  DLBCL: s/p 2 cycles of chemo per Dr. Lindi Adie, planning 4 more cycles.  - Per oncology.  AKI on stage II CKD: Resolved - Monitor metabolic panel in AM  Hypertension: At goal while holding losartan/HCTZ. - Prn hydralazine only. Would recommend not continuing home meds at discharge if BP remains at this level.  Elevated LFT: Improving, possibly due to chemotherapy. - Holding statin - Monitor in AM  Leukocytosis: Infection vs. VTE  vs neulasta. Mild. - Has remained afebrile off abx  Anemia of chronic disease : - Monitor hemoglobin.   Thrombocytopenia: No signs of bleeding. Resolved.  DVT prophylaxis: Therapeutic anticoagulation heparin > xarelto 11/3. Code Status: Full Family Communication: Husband at bedside Disposition Plan: Home pending education/titration of tube feeding probably 11/5    Consultants:   Gastroenterology, Dr. Loletha Carrow  ENT, Dr. Constance Holster  Discussed with CT surgery, Dr. Prescott Gum, over the phone.   Oncology, Dr. Lindi Adie  Nutrition, Jarome Matin, RD.   Subjective: Tolerating tube feeds. Patient and husband have been instructed in bolus feedings which are being started today. Still coughing white/yellow periodically. No choking or fever or chest pain.  Objective: BP (!) 125/40 (BP Location: Right Arm)   Pulse 88   Temp 99.1 F (37.3 C) (Oral)   Resp 18   Ht 5\' 2"  (1.575 m)   Wt 98.5 kg (217 lb 2.5 oz)   SpO2 96%   BMI 39.72  kg/m  Gen: Alert, pleasant female in no distress.  HEENT: Hoarse voice.  Pulm: Non-labored on room air at rest; decreased at bases. CV: Regular rate, no murmur appreciated; distal pulses intact/symmetric GI: + BS; soft, non-tender,  non-distended Skin: Left abdomen with mildly tender PEG insertion site without erythema or discharge. No other tenderness.   Time spent: 15 minutes.   Vance Gather, MD [facility] Pager [phone]  If 7PM-7AM, please contact night-coverage [email or web address] Password [email or web address] 02/22/2017, 2:31 PM

## 2017-02-23 DIAGNOSIS — I2699 Other pulmonary embolism without acute cor pulmonale: Secondary | ICD-10-CM

## 2017-02-23 LAB — COMPREHENSIVE METABOLIC PANEL
ALK PHOS: 38 U/L (ref 38–126)
ALT: 96 U/L — ABNORMAL HIGH (ref 14–54)
ANION GAP: 7 (ref 5–15)
AST: 89 U/L — ABNORMAL HIGH (ref 15–41)
Albumin: 2.3 g/dL — ABNORMAL LOW (ref 3.5–5.0)
BILIRUBIN TOTAL: 0.6 mg/dL (ref 0.3–1.2)
BUN: 13 mg/dL (ref 6–20)
CALCIUM: 8.2 mg/dL — AB (ref 8.9–10.3)
CO2: 23 mmol/L (ref 22–32)
Chloride: 108 mmol/L (ref 101–111)
Creatinine, Ser: 0.9 mg/dL (ref 0.44–1.00)
Glucose, Bld: 98 mg/dL (ref 65–99)
Potassium: 4.1 mmol/L (ref 3.5–5.1)
SODIUM: 138 mmol/L (ref 135–145)
TOTAL PROTEIN: 5.4 g/dL — AB (ref 6.5–8.1)

## 2017-02-23 LAB — GLUCOSE, CAPILLARY
GLUCOSE-CAPILLARY: 140 mg/dL — AB (ref 65–99)
GLUCOSE-CAPILLARY: 168 mg/dL — AB (ref 65–99)
GLUCOSE-CAPILLARY: 86 mg/dL (ref 65–99)
Glucose-Capillary: 107 mg/dL — ABNORMAL HIGH (ref 65–99)
Glucose-Capillary: 136 mg/dL — ABNORMAL HIGH (ref 65–99)
Glucose-Capillary: 136 mg/dL — ABNORMAL HIGH (ref 65–99)

## 2017-02-23 LAB — CBC
HCT: 25.5 % — ABNORMAL LOW (ref 36.0–46.0)
HEMOGLOBIN: 8.4 g/dL — AB (ref 12.0–15.0)
MCH: 29.1 pg (ref 26.0–34.0)
MCHC: 32.9 g/dL (ref 30.0–36.0)
MCV: 88.2 fL (ref 78.0–100.0)
Platelets: 307 10*3/uL (ref 150–400)
RBC: 2.89 MIL/uL — AB (ref 3.87–5.11)
RDW: 19.5 % — ABNORMAL HIGH (ref 11.5–15.5)
WBC: 15.5 10*3/uL — ABNORMAL HIGH (ref 4.0–10.5)

## 2017-02-23 MED ORDER — HEPARIN SOD (PORK) LOCK FLUSH 100 UNIT/ML IV SOLN
500.0000 [IU] | INTRAVENOUS | Status: DC
  Filled 2017-02-23: qty 5

## 2017-02-23 MED ORDER — SACCHAROMYCES BOULARDII 250 MG PO CAPS: 250.0000 mg | ORAL_CAPSULE | Freq: Two times a day (BID) | ORAL | 0 refills | Status: DC

## 2017-02-23 MED ORDER — RIVAROXABAN (XARELTO) VTE STARTER PACK (15 & 20 MG): ORAL_TABLET | ORAL | 0 refills | Status: DC

## 2017-02-23 MED ORDER — AMOXICILLIN-POT CLAVULANATE 875-125 MG PO TABS: 1.0000 | ORAL_TABLET | Freq: Two times a day (BID) | ORAL | 0 refills | Status: DC

## 2017-02-23 MED ORDER — OMEPRAZOLE 2 MG/ML ORAL SUSPENSION: 20.0000 mg | Freq: Every day | ORAL | 0 refills | Status: DC

## 2017-02-23 MED ORDER — HEPARIN SOD (PORK) LOCK FLUSH 100 UNIT/ML IV SOLN
500.0000 [IU] | INTRAVENOUS | Status: DC | PRN
  Administered 2017-02-23: 500 [IU]
  Filled 2017-02-23: qty 5

## 2017-02-23 NOTE — Discharge Summary (Signed)
Physician Discharge Summary  Merranda Bolls KZS:[id number] DOB: [birth year] DOA: 02/16/2017  PCP: Marletta Lor, MD  Admit date: 02/16/2017 Discharge date: 02/23/2017  Admitted From: Home Disposition: Home   Recommendations for Outpatient Follow-up:  1. Follow up with oncology. Will need referral to tertiary care center for surgical correction of tracheoesophageal fistula. 2. Continue xarelto for PE, no oxygen required at discharge 3. Monitor BP, holding antihypertensives at discharge due to low/normal BP despite holding medications here.    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  Since she was coughing with po intake, SLP eval was done and she underwent MBS showing a esophageal-tracheal fistula.  GI consulted, recommended CT surgery evaluation. Unfortunately CT surgery said, she would benefit from transfer to a tertiary care surgery for surgical repair of the fistula. Updated Dr Lindi Adie. ENT, Dr. Constance Holster has recommended definitive care of pulmonary emboli and chemotherapy prior to surgical management of TE fistula. PEG tube placed 11/1, tube feeding started 11/2.   Discharge Diagnoses:  Principal Problem:   Tracheoesophageal fistula, acquired ([place]) Active Problems:   Essential hypertension   Chronic kidney disease   Diffuse large B cell lymphoma ([place])   AKI (acute kidney injury) ([place])   Acute on chronic respiratory failure with hypoxia (HCC)   Acute respiratory failure with hypoxia (HCC)   Pulmonary  embolism (HCC)  Acute respiratory failure with hypoxia: Secondary to pulmonary embolism. Initial concern for aspiration pneumonitis/pneumonia, though she effectively clears airway secretions on MBS and has no CXR infiltrates or fever so unasyn was stopped 10/31.  - Transitioned to xarelto 11/3, education provided by pharmacy. No bleeding.  - Hypoxia has resolved.   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 s/p PEG placement 11/1.  - Discussed with oncology, ENT, CT surgery who recommend referral to subspecialty surgeon at tertiary care center. However, this is not emergent.  - Continue NPO, though would benefit from ice chips sparsely if tolerated to prevent disuse atrophy. SLP closely involved. Further oral care per SLP. - Tube feed initiation and education per nutrition: Slow titration, now converting to bolus feeds: low volume QID >> planning goal volume TID prior to discharge.  DLBCL: s/p 2 cycles of chemo per Dr. Lindi Adie, planning 4 more cycles.  - Per oncology.  AKI on stage II CKD: Resolved - Monitor metabolic panel in AM  Hypertension: At goal while holding losartan/HCTZ. - Prn hydralazine only. Would recommend not continuing home meds at discharge if BP remains at this level.  Elevated LFT: Improving, possibly due to chemotherapy. - Holding statin - Monitor at follow up  Leukocytosis: Infection vs. VTE  vs neulasta. Worsening - Due to risk of aspiration, will restart abx x7 days.   Anemia of chronic disease : - Monitor hemoglobin.   Thrombocytopenia: No signs of bleeding. Resolved.  Discharge Instructions Discharge Instructions    Diet - low sodium heart healthy   Complete by:  As directed    Discharge instructions   Complete by:  As directed    - Continue taking xarelto as directed. A starter pack was prescribed.  - Hold lipitor for now -  I am recommending that you stop the blood pressure medications until you follow up because your blood pressures  have been on the low side despite no taking these while you were here.  - Due to the risk of aspiration pneumonia and the white blood cell count creeping upward, I believe the risk of antibiotics is work the risk of a 7 day course. You should take probiotics, but may develop diarrhea. This is ok as long as you are tolerating the tube feedings.  - Follow up with Dr. Lindi Adie, or seek medical attention sooner if you develop worsening symptoms or fever.   Increase activity slowly   Complete by:  As directed      Allergies as of 02/23/2017   No Known Allergies     Medication List    STOP taking these medications   atorvastatin 10 MG tablet Commonly known as:  LIPITOR   losartan-hydrochlorothiazide 100-25 MG tablet Commonly known as:  HYZAAR   omeprazole 20 MG capsule Commonly known as:  PRILOSEC Replaced by:  omeprazole 2 mg/mL Susp     TAKE these medications   allopurinol 300 MG tablet Commonly known as:  ZYLOPRIM Take 1 tablet (300 mg total) by mouth daily.   amoxicillin-clavulanate 875-125 MG tablet Commonly known as:  AUGMENTIN Place 1 tablet 2 (two) times daily into feeding tube.   chlorpheniramine-HYDROcodone 10-8 MG/5ML Suer Commonly known as:  TUSSIONEX Take 5 mLs by mouth every 12 (twelve) hours as needed for cough.   HYDROcodone-acetaminophen 7.5-325 MG tablet Commonly known as:  NORCO Take 1 tablet by mouth every 6 (six) hours as needed for moderate pain.   levothyroxine 50 MCG tablet Commonly known as:  SYNTHROID, LEVOTHROID TAKE 1 TABLET BY MOUTH DAILY   lidocaine 2 % solution Commonly known as:  XYLOCAINE Use as directed 20 mLs in the mouth or throat every 3 (three) hours as needed for mouth pain.   lidocaine-prilocaine cream Commonly known as:  EMLA Apply to affected area once   LORazepam 0.5 MG tablet Commonly known as:  ATIVAN Take 1 tablet (0.5 mg total) by mouth every 6 (six) hours as needed (Nausea or vomiting).   magic mouthwash w/lidocaine  Soln Take 5 mLs by mouth 3 (three) times daily as needed for mouth pain.   omeprazole 2 mg/mL Susp Commonly known as:  PRILOSEC Place 10 mLs (20 mg total) daily into feeding tube. Replaces:  omeprazole 20 MG capsule   ondansetron 8 MG tablet Commonly known as:  ZOFRAN Take 1 tablet (8 mg total) by mouth 2 (two) times daily as needed for refractory nausea / vomiting. Start on day 3 after cyclophosphamide chemotherapy.   predniSONE 20 MG tablet Commonly known as:  DELTASONE Take 3 tablets (60 mg total) by mouth daily. Take on days 1-5 of chemotherapy.   prochlorperazine 10 MG tablet Commonly known as:  COMPAZINE Take 1 tablet (10 mg total) by mouth every 6 (six) hours as needed (Nausea or vomiting).   promethazine 25 MG suppository Commonly known as:  PHENERGAN Place 1 suppository (25 mg total) rectally every 6 (six) hours as needed for nausea or vomiting.   Rivaroxaban 15 & 20 MG Tbpk Take as directed on package: Start with one 15mg  tablet by mouth twice a day with food. On Day 22, switch to one 20mg  tablet once a day with food.   saccharomyces boulardii 250 MG capsule Commonly known as:  FLORASTOR Take 1 capsule (250 mg total) 2 (two) times daily by mouth.  sucralfate 1 GM/10ML suspension Commonly known as:  CARAFATE Take 10 mLs (1 g total) by mouth 4 (four) times daily -  with meals and at bedtime.      Follow-up Information    [facility] Follow up.   Why:  [place] nursing-Tube Feeds instruction, & supplies/physical therapy Contact information: [address] [place] [address] [phone]        Marletta Lor, MD Follow up.   Specialty:  Internal Medicine Contact information: [place] [address] [phone]        Nicholas Lose, MD. Schedule an appointment as soon as possible for a visit.   Specialty:  Hematology and Oncology Contact information: [place]  [address] [phone]          No Known Allergies  Consultations:  Gastroenterology, Dr. Loletha Carrow  ENT, Dr. Constance Holster  Discussed with CT surgery, Dr. Prescott Gum, over the phone.   Oncology, Dr. Lindi Adie  Nutrition, Jarome Matin, RD.   Procedures/Studies: Dg Chest 2 View  Result Date: 02/16/2017 CLINICAL DATA:  Dyspnea on exertion. Hypoxia. Progressive shortness breath for 2 days. Esophageal cancer. EXAM: CHEST  2 VIEW COMPARISON:  Chest film associated with Port-A-Cath insertion 01/14/2017. PET scan 12/08/2016. FINDINGS: Heart size is normal. Lung volumes are low. There is no edema or effusion. No focal airspace disease present. Aortic atherosclerosis is noted. A right IJ Port-A-Cath is stable. Surgical clips are noted in the right breast and axilla. The upper abdomen is unremarkable. IMPRESSION: 1. No acute cardiopulmonary disease. 2. Low lung volumes. Electronically Signed   By: San Morelle M.D.   On: 02/16/2017 13:05   Ir Gastrostomy Tube Mod Sed  Result Date: 02/19/2017 INDICATION: 71 year old with lymphoma and tracheoesophageal fistula. Patient needs a gastrostomy tube in order to avoid the fistula. EXAM: PERCUTANEOUS GASTROSTOMY TUBE WITH FLUOROSCOPIC GUIDANCE Physician: Stephan Minister. Anselm Pancoast, MD MEDICATIONS: Ancef 2 g; Antibiotics were administered within 1 hour of the procedure. Glucagon 0.5 mg IV ANESTHESIA/SEDATION: Versed 2.0 mg IV; Fentanyl 100 mcg IV Moderate Sedation Time:  37 minutes The patient was continuously monitored during the procedure by the interventional radiology nurse under my direct supervision. FLUOROSCOPY TIME:  Fluoroscopy Time: 5 minutes 6 seconds (98 mGy). COMPLICATIONS: None immediate. PROCEDURE: Informed consent was obtained for a percutaneous gastrostomy tube. The patient was placed on the interventional table. Fluoroscopy demonstrated gas in the transverse colon. An orogastric tube was placed with fluoroscopic guidance without complication. The anterior  abdomen was prepped and draped in sterile fashion. Maximal barrier sterile technique was utilized including caps, mask, sterile gowns, sterile gloves, sterile drape, hand hygiene and skin antiseptic. Stomach was inflated with air through the orogastric tube. The skin and subcutaneous tissues were anesthetized with 1% lidocaine. A total of three SAF-T-[id number] T-fasteners were placed in the stomach using fluoroscopic guidance. Small incision was made between the T-fasteners. Needle was directed into the stomach with fluoroscopic guidance between the T-fasteners. A small amount of contrast was injected to confirm placement in the stomach. Wire was advanced into the stomach. The tract was dilated to accommodate an 18 French peel-away sheath. A 16 French Entuit gastrostomy tube was easily advanced over the wire and a peel-away sheath was removed. The balloon was inflated with 6 mL of saline. Contrast injection confirmed placement in the stomach. The gastrostomy tube was flushed with normal saline. Fluoroscopic images were taken and saved for this procedure. IMPRESSION: Successful fluoroscopic guided percutaneous gastrostomy tube placement. Electronically Signed  By: Markus Daft M.D.   On: 02/19/2017 18:49   Nm Pulmonary Perf And Vent  Addendum Date: 02/16/2017   ADDENDUM REPORT: 02/16/2017 20:22 ADDENDUM: Initial attempts to contact Hospitalists by pager at [facility]. Ultimately, Critical Value/emergent results were called by telephone at the time of interpretation on 02/16/2017 at 8:20 pm to Nurse [facility], who verbally acknowledged these results. Electronically Signed   By: Suzy Bouchard M.D.   On: 02/16/2017 20:22   Result Date: 02/16/2017 CLINICAL DATA:  Elevated D-dimer. Esophageal carcinoma. Elevated white count. Short of breath. EXAM: NUCLEAR MEDICINE VENTILATION - PERFUSION LUNG SCAN TECHNIQUE: Ventilation images were obtained in multiple projections using inhaled aerosol Tc-47m DTPA. Perfusion images were  obtained in multiple projections after intravenous injection of Tc-30m MAA. RADIOPHARMACEUTICALS:  28.8 mCi Technetium-42m DTPA aerosol inhalation and 4.1 mCi Technetium-68m MAA IV COMPARISON:  Chest radiograph 02/16/2017 FINDINGS: Ventilation: Informed ventilation per Perfusion: There are subtle wedge-shaped perfusion defects in the LEFT RIGHT upper lobe. Smaller defects at the LEFT and RIGHT lung base posteriorly. These peripheral perfusion defects are unmatched to ventilation. IMPRESSION: Bilateral un matched wedge-shaped peripheral perfusion defects most consistent with acute pulmonary embolism. Electronically Signed: By: Suzy Bouchard M.D. On: 02/16/2017 19:50   Dg Swallowing Func-speech Pathology  Result Date: 02/17/2017 CLINICAL DATA:  71 year old female diagnosed with Lymphoma including a mass at the left tracheoesophageal groove near the thyroid. Multiple imaging guided biopsies were performed, but ultimately a surgical biopsy of the mass was necessary on 12/26/2016 for diagnosis. The patient is now status post chemotherapy, with an unexplained cough, coughing with p.o. intake. EXAM: MODIFIED BARIUM SWALLOW TECHNIQUE: Different consistencies of barium were administered orally to the patient by the Speech Pathologist. Imaging of the pharynx was performed in the lateral projection. FLUOROSCOPY TIME:  Fluoroscopy Time:  0 minutes 54 seconds Radiation Exposure Index (if provided by the fluoroscopic device): 1.8mG y Number of Acquired Spot Images: 0 COMPARISON:  Neck CT 11/21/2016. FINDINGS: Nectar thick liquid- the patient is able to swallow liquid into the cervical esophagus, but with each swallow there is barium extension from the cervical esophagus (C5/C6 level) through the posterior wall of the trachea into the tracheal lumen. This occurs about 18 mm below the level of the glottis (series 5, image 4). IMPRESSION: Positive for acquired tracheoesophageal fistula at the level of the cervical esophagus  (C5-C6) about 18 mm below the glottis. PO barium liquid freely passed through the fistula and into the airway with each swallow. Study discussed by telephone with Dr. Hosie Poisson on 02/17/2017 at 1245 hours. Please refer to the Speech Pathologists report for complete details and recommendations. Electronically Signed   By: Genevie Ann M.D.   On: 02/17/2017 13:28    Subjective: Feels well. No fevers. Cough is stable, though did have some tan-colored material in sputum which is new since starting feedings. Denies feeling like choking.   Discharge Exam: Vitals:   02/22/17 2043 02/23/17 0421  BP: (!) 144/113 (!) 123/45  Pulse: 98 87  Resp: 20 20  Temp: 99.8 F (37.7 C) 99.9 F (37.7 C)  SpO2: 97% 95%   Gen: Alert, pleasant female in no distress.  HEENT: Hoarse voice.  Pulm: Non-labored on room air at rest; decreased at bases. CV: Regular rate, no murmur appreciated; distal pulses intact/symmetric GI: + BS; soft, non-tender, non-distended Skin: Left abdomen with mildly tender PEG insertion site without erythema or discharge. No other tenderness.   Labs: Basic Metabolic Panel: Recent Labs  Lab 02/17/17  0700 02/17/17 1838 02/18/17 0514 02/19/17 0441 02/20/17 0515 02/23/17 0642  NA 138  --  139 138 135 138  K 3.5  --  3.7 3.1* 3.6 4.1  CL 105  --  107 104 106 108  CO2 24  --  25 24 22 23   GLUCOSE 95  --  147* 145* 169* 98  BUN 31*  --  19 14 16 13   CREATININE 1.71*  --  1.49* 1.18* 0.95 0.90  CALCIUM 7.7*  --  8.0* 7.7* 7.7* 8.2*  MG  --  1.8 1.7 1.6* 1.9  --   PHOS  --  3.5 4.0 4.1 3.5  --    Liver Function Tests: Recent Labs  Lab 02/17/17 0700 02/18/17 0514 02/19/17 0441 02/20/17 0515 02/23/17 0642  AST 90* 77* 58* 56* 89*  ALT 160* 134* 105* 91* 96*  ALKPHOS 50 50 47 47 38  BILITOT 0.5 0.5 0.6 0.3 0.6  PROT 5.2* 5.2* 5.2* 5.4* 5.4*  ALBUMIN 2.2* 2.3* 2.1* 2.1* 2.3*   CBC: Recent Labs  Lab 02/18/17 0514 02/19/17 0441 02/20/17 0515 02/21/17 0610 02/23/17 0642   WBC 11.1* 9.3 11.7* 11.6* 15.5*  NEUTROABS 9.3* 7.6  --   --   --   HGB 8.3* 8.4* 8.8* 8.7* 8.4*  HCT 24.9* 25.8* 26.3* 26.1* 25.5*  MCV 87.4 87.5 86.8 87.6 88.2  PLT 131* 166 190 237 307   CBG: Recent Labs  Lab 02/22/17 0632 02/22/17 1203 02/22/17 1802 02/22/17 2339 02/23/17 0556  GLUCAP 140* 168* 136* 136* 86    Time coordinating discharge: Approximately 40 minutes  Vance Gather, MD  [facility] 02/23/2017, 4:58 PM Pager [phone]

## 2017-02-23 NOTE — Progress Notes (Signed)
Nutrition Follow-up  DOCUMENTATION CODES:   Obesity unspecified  INTERVENTION:   -Continue 1/2 carton (120 ml) Osmolite 1.5 QID (2 cartons total). -Recommend free water flush with 60 ml before and after each bolus (480 ml total). -This will provide 710 kcal, 29g protein and 842 ml H2O.  Goal rate:  -6 cartons of Osmolite 1.5 daily (480 ml TID). -Free water flushes 60 ml before and after each bolus (360 ml total) -Recommend additional free water administration of 200 ml TID -This provides 2130 kcal (98% of needs), 89g protein (94% of needs) and 2047 ml H2O.  Provided TF management handout with written instructions.  NUTRITION DIAGNOSIS:   Inadequate oral intake related to inability to eat as evidenced by NPO status.  Ongoing.  GOAL:   Patient will meet greater than or equal to 90% of their needs  Progressing.  MONITOR:   TF tolerance, Weight trends, Labs, I & O's, Other (Comment)(TPN regimen)  ASSESSMENT:   71 y.o. 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 She relates that for the last 3 weeks she's been coughing every time she drinks liquids, any oncologist office she was found to have a saturation of 87% tachycardic at 114 and a blood pressure 118/48 she was placed into a nasal cannula and her saturations improved to 95%.  Reviewed TF regimen with patient and patient's husband. Explained how to advance bolus feeds to goal rate of 6 cartons daily.  Pt tolerating 1/2 carton (155ml) QID at this time. Encouraged pt to follow-up with [place] [facility].   Labs reviewed. Medications: Geriatric MVI daily, IV Protonix daily    Diet Order:  Diet NPO time specified  EDUCATION NEEDS:   Education needs have been addressed  Skin:  Skin Assessment: Reviewed RN Assessment  Last BM:  11/3  Height:   Ht Readings from Last 1 Encounters:  02/19/17 5\' 2"  (1.575 m)    Weight:   Wt Readings from Last 1 Encounters:  02/23/17 217 lb 6 oz (98.6 kg)    Ideal Body Weight:  52.27 kg  BMI:  Body mass index is 39.76 kg/m.  Estimated Nutritional Needs:   Kcal:  6761-9509 (23-25 kcal/kg)  Protein:  95-115 grams  Fluid:  >/= 2.1 L/day  Clayton Bibles, MS, RD, LDN [place] Dietitian Pager: [phone] After Hours Pager: [phone]

## 2017-02-23 NOTE — Care Management Note (Signed)
Case Management Note  Patient Details  Name: Kristen Escobar MRN: [id number] Date of Birth: December 19, 1945  Subjective/Objective:  [facility] rep Santiago Glad aware of d/c today, HHC-RN-TF,supplies;PT. No further CM needs.                 Action/Plan:d/c home w/HHC/dme   Expected Discharge Date:  (unknown)               Expected Discharge Plan:  [place]  In-House Referral:     Discharge planning Services  CM Consult  Post Acute Care Choice:    Choice offered to:  Patient  DME Arranged:  Tube feeding DME Agency:  [place] Arranged:  RN, PT(Tube feeds, & supplies) [place] Agency:  [facility]  Status of Service:  Completed, signed off  If discussed at [place] of Stay Meetings, dates discussed:    Additional Comments:  Dessa Phi, RN 02/23/2017, 10:17 AM

## 2017-02-23 NOTE — Progress Notes (Signed)
Pt and husband given discharge teaching including medications and schedules. Home Care arranged and Pt and Husband verbalized understanding of tube feedings administration and Husband gave 10 am bolus feeding without difficulty. All medications to be given via tube and MD informed that Prilosec capsule can not be opened and given via tube and medication changed to liquid form and Husband given new prescription prior to discharge. Pt and Husband verbalized understanding of all discharge home care  instructions and medications. Discharged to home via wheelchair with discharge packet and prescriptions.

## 2017-02-24 ENCOUNTER — Other Ambulatory Visit: Payer: Self-pay

## 2017-02-24 ENCOUNTER — Telehealth: Payer: Self-pay | Admitting: *Deleted

## 2017-02-24 ENCOUNTER — Encounter (HOSPITAL_COMMUNITY): Payer: Self-pay | Admitting: Emergency Medicine

## 2017-02-24 ENCOUNTER — Emergency Department (HOSPITAL_COMMUNITY): Payer: Medicare Other

## 2017-02-24 ENCOUNTER — Emergency Department (HOSPITAL_COMMUNITY)
Admission: EM | Admit: 2017-02-24 | Discharge: 2017-02-24 | Disposition: A | Discharge status: Home / Self Care | Payer: Medicare Other | Attending: Emergency Medicine | Admitting: Emergency Medicine

## 2017-02-24 DIAGNOSIS — Z96641 Presence of right artificial hip joint: Secondary | ICD-10-CM | POA: Insufficient documentation

## 2017-02-24 DIAGNOSIS — Z79899 Other long term (current) drug therapy: Secondary | ICD-10-CM | POA: Diagnosis not present

## 2017-02-24 DIAGNOSIS — E039 Hypothyroidism, unspecified: Secondary | ICD-10-CM | POA: Diagnosis not present

## 2017-02-24 DIAGNOSIS — R509 Fever, unspecified: Secondary | ICD-10-CM | POA: Diagnosis not present

## 2017-02-24 DIAGNOSIS — Z87891 Personal history of nicotine dependence: Secondary | ICD-10-CM | POA: Insufficient documentation

## 2017-02-24 DIAGNOSIS — R05 Cough: Secondary | ICD-10-CM | POA: Diagnosis not present

## 2017-02-24 DIAGNOSIS — Z853 Personal history of malignant neoplasm of breast: Secondary | ICD-10-CM | POA: Insufficient documentation

## 2017-02-24 DIAGNOSIS — R7989 Other specified abnormal findings of blood chemistry: Secondary | ICD-10-CM | POA: Diagnosis not present

## 2017-02-24 DIAGNOSIS — N189 Chronic kidney disease, unspecified: Secondary | ICD-10-CM | POA: Insufficient documentation

## 2017-02-24 DIAGNOSIS — R748 Abnormal levels of other serum enzymes: Secondary | ICD-10-CM | POA: Diagnosis not present

## 2017-02-24 DIAGNOSIS — R945 Abnormal results of liver function studies: Secondary | ICD-10-CM

## 2017-02-24 DIAGNOSIS — I129 Hypertensive chronic kidney disease with stage 1 through stage 4 chronic kidney disease, or unspecified chronic kidney disease: Secondary | ICD-10-CM | POA: Insufficient documentation

## 2017-02-24 DIAGNOSIS — R059 Cough, unspecified: Secondary | ICD-10-CM

## 2017-02-24 LAB — COMPREHENSIVE METABOLIC PANEL
ALBUMIN: 2.8 g/dL — AB (ref 3.5–5.0)
ALK PHOS: 52 U/L (ref 38–126)
ALT: 197 U/L — ABNORMAL HIGH (ref 14–54)
ANION GAP: 12 (ref 5–15)
AST: 179 U/L — AB (ref 15–41)
BILIRUBIN TOTAL: 0.6 mg/dL (ref 0.3–1.2)
BUN: 16 mg/dL (ref 6–20)
CO2: 21 mmol/L — AB (ref 22–32)
Calcium: 8.9 mg/dL (ref 8.9–10.3)
Chloride: 101 mmol/L (ref 101–111)
Creatinine, Ser: 1.07 mg/dL — ABNORMAL HIGH (ref 0.44–1.00)
GFR calc Af Amer: 60 mL/min — ABNORMAL LOW (ref >60)
GFR calc non Af Amer: 51 mL/min — ABNORMAL LOW (ref >60)
GLUCOSE: 150 mg/dL — AB (ref 65–99)
POTASSIUM: 4.1 mmol/L (ref 3.5–5.1)
Sodium: 134 mmol/L — ABNORMAL LOW (ref 135–145)
TOTAL PROTEIN: 7 g/dL (ref 6.5–8.1)

## 2017-02-24 LAB — I-STAT CG4 LACTIC ACID, ED: Lactic Acid, Venous: 1.46 mmol/L (ref 0.5–1.9)

## 2017-02-24 LAB — CBC WITH DIFFERENTIAL/PLATELET
BASOS PCT: 1 %
Basophils Absolute: 0.2 10*3/uL — ABNORMAL HIGH (ref 0.0–0.1)
Eosinophils Absolute: 0 10*3/uL (ref 0.0–0.7)
Eosinophils Relative: 0 %
HEMATOCRIT: 31.8 % — AB (ref 36.0–46.0)
HEMOGLOBIN: 10.3 g/dL — AB (ref 12.0–15.0)
LYMPHS ABS: 0.9 10*3/uL (ref 0.7–4.0)
LYMPHS PCT: 6 %
MCH: 28.5 pg (ref 26.0–34.0)
MCHC: 32.4 g/dL (ref 30.0–36.0)
MCV: 88.1 fL (ref 78.0–100.0)
MONOS PCT: 12 %
Monocytes Absolute: 1.8 10*3/uL — ABNORMAL HIGH (ref 0.1–1.0)
Neutro Abs: 12.5 10*3/uL — ABNORMAL HIGH (ref 1.7–7.7)
Neutrophils Relative %: 81 %
Platelets: 468 10*3/uL — ABNORMAL HIGH (ref 150–400)
RBC: 3.61 MIL/uL — ABNORMAL LOW (ref 3.87–5.11)
RDW: 19.2 % — AB (ref 11.5–15.5)
WBC: 15.4 10*3/uL — ABNORMAL HIGH (ref 4.0–10.5)

## 2017-02-24 LAB — URINALYSIS, ROUTINE W REFLEX MICROSCOPIC
BILIRUBIN URINE: NEGATIVE
Glucose, UA: NEGATIVE mg/dL
KETONES UR: NEGATIVE mg/dL
NITRITE: NEGATIVE
PROTEIN: NEGATIVE mg/dL
Specific Gravity, Urine: 1.02 (ref 1.005–1.030)
pH: 6 (ref 5.0–8.0)

## 2017-02-24 MED ORDER — ONDANSETRON HCL 4 MG/2ML IJ SOLN
4.0000 mg | Freq: Once | INTRAMUSCULAR | Status: AC
  Administered 2017-02-24: 4 mg via INTRAVENOUS
  Filled 2017-02-24: qty 2

## 2017-02-24 MED ORDER — SODIUM CHLORIDE 0.9 % IV BOLUS (SEPSIS)
500.0000 mL | Freq: Once | INTRAVENOUS | Status: AC
  Administered 2017-02-24: 500 mL via INTRAVENOUS

## 2017-02-24 NOTE — Telephone Encounter (Signed)
Transition Care Management Follow-up Telephone Call  Per Discharge Summary: Admit date: 02/16/2017 Discharge date: 02/23/2017  Admitted From: Home Disposition: Home   Recommendations for Outpatient Follow-up:  1. Follow up with oncology. Will need referral to tertiary care center for surgical correction of tracheoesophageal fistula. 2. Continue xarelto for PE, no oxygen required at discharge 3. Monitor BP, holding antihypertensives at discharge due to low/normal BP despite holding medications here.    Home Health: RN, PT Equipment/Devices: PEG tube placed 11/1 Discharge Condition: Stable CODE STATUS: Full Diet recommendation: Per tube as below  --   How have you been since you were released from the hospital? "She's been okay. We just finished doing the feeding. This whole situation is overwhelming at this point." Husband reports feedings last night were fine, this morning they got 4 meds in and it wasn't going in by gravity. He was able to administer the rest of the medication and fluid via syringe. There is no resistance against the syringe and patient is not having pain or distention. Advised him to continue to make sure that the tube is well flushed to prevent clogging and have home health RN help him to troubleshoot any issues with the tubing/gravity feeds when they come to the house.   Do you understand why you were in the hospital? yes   Do you understand the discharge instructions? yes   Where were you discharged to? Home   Items Reviewed:  Medications reviewed: yes  Allergies reviewed: yes  Dietary changes reviewed: yes  Referrals reviewed: yes   Functional Questionnaire:   Activities of Daily Living (ADLs):   She states they are independent in the following: ambulation, bathing and hygiene, continence, grooming, toileting and dressing States they require assistance with the following: feeding and Patient in NPO, on tube feedings. Husband assists w/  feedings.   Any transportation issues/concerns?: no   Any patient concerns? yes, patient was discharged with liquid omeprazole because hospital pharmacist felt that was preferable to emptying contents of the capsule to give via tube. However, the liquid is not covered by insurance so patient and spouse would like to switch back to the capsule and give via tube. Spouse has spoken with two other pharmacists who feels this is okay. Please advise if okay to switch back to omeprazole capsules and I will call spouse back. They do not need another rx as they have a full bottle at home.   Confirmed importance and date/time of follow-up visits scheduled yes  Provider Appointment booked with Dr. Bluford Kaufmann 03/02/17 @11 :15am  Confirmed with patient if condition begins to worsen call PCP or go to the ER.  Patient was given the office number and encouraged to call back with question or concerns.  : yes

## 2017-02-24 NOTE — Discharge Instructions (Signed)
Your workup tonight did not show evidence of pneumonia.  We did not find other sources of infection.  Please call your oncology team tomorrow to discuss follow-up.  If any symptoms change or worsen, please return to the nearest emergency department.

## 2017-02-24 NOTE — ED Provider Notes (Signed)
[facility] DEPT Provider Note   CSN: [id number] Arrival date & time: 02/24/17  0998     History   Chief Complaint Chief Complaint  Patient presents with  . Fever    HPI Kristen Escobar is a 71 y.o. female.  The history is provided by the patient and medical records. No language interpreter was used.  Fever   This is a new problem. The current episode started 12 to 24 hours ago. The problem occurs constantly. The problem has been gradually improving. The maximum temperature noted was 100 to 100.9 F. The temperature was taken using an oral thermometer. Associated symptoms include vomiting and cough. Pertinent negatives include no chest pain, no diarrhea, no congestion and no headaches. She has tried nothing for the symptoms. The treatment provided no relief.    Past Medical History:  Diagnosis Date  . Arthritis   . Cancer ([place]) 03/2009   breast- rt  . GERD (gastroesophageal reflux disease)   . History of radiation therapy 07/12/10,completed   right breast 60 Gy x30 fx  . Hypertension   . Hypothyroidism   . Obesity   . Peripheral vascular disease ([place]) 1995   PT DEVELOPED CIRCULATION PROBLEMS IN BOTH HANDS AND GANGRENE OF BOTH INDEX FINGERS--REQUIRING AMPUTATION OF THE INDEX FINGERS AND VASCULAR SURGERY.  PT TOLD HER PROBLEMS RELATED TO SMOKING.   NO OTHER PROBLEMS SINCE.  Marland Kitchen Pneumonia   . Thyroid mass     Patient Active Problem List   Diagnosis Date Noted  . 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 Pulmonary embolism ([place]) 02/22/2017  . AKI (acute kidney injury) ([place]) 02/16/2017  . Acute on chronic respiratory failure with hypoxia ([place]) 02/16/2017  . Acute respiratory failure with hypoxia ([place]) 02/16/2017  . Port or reservoir infection 01/20/2017  . Port-A-Cath in place 01/20/2017  . Diffuse large B cell lymphoma ([place]) 01/01/2017  . Neck mass 12/26/2016  . Esophageal mass 11/27/2016  . Hypothyroidism 03/25/2016  .  Bilateral carotid bruits 08/22/2015  . Impaired glucose tolerance 08/22/2015  . Chronic kidney disease 08/22/2015  . Benign paroxysmal positional vertigo 02/05/2013  . S/P left THA, AA 01/20/2012  . Osteoarthritis 09/24/2011  . Breast cancer of upper-outer quadrant of right female breast ([place]) 03/21/2011  . Morbid obesity ([place]) 08/02/2009  . Essential hypertension 08/02/2009    Past Surgical History:  Procedure Laterality Date  . AMPUTATION     partial amputation of both index fingers  . BREAST SURGERY  2011   lumpectomy with node sampling- RIGHT  . COLONOSCOPY    . ESOPHAGOGASTRODUODENOSCOPY    . HAND SURGERY Bilateral 1995   Amputaed pointer fingers bilaterally  . IR FLUORO GUIDE PORT INSERTION RIGHT  01/14/2017  . IR GASTROSTOMY TUBE MOD SED  02/19/2017  . IR US GUIDE VASC ACCESS RIGHT  01/14/2017  . TOTAL HIP ARTHROPLASTY  12/16/2011   right hip  . TUBAL LIGATION    . VASCULAR SURGERY     both hands    OB History    No data available       Home Medications    Prior to Admission medications   Medication Sig Start Date End Date Taking? Authorizing Provider  allopurinol (ZYLOPRIM) 300 MG tablet Take 1 tablet (300 mg total) by mouth daily. 01/01/17   Nicholas Lose, MD  amoxicillin-clavulanate (AUGMENTIN) 875-125 MG tablet Place 1 tablet 2 (two) times daily into feeding tube. 02/23/17   Patrecia Pour, MD  chlorpheniramine-HYDROcodone ([place]) 10-8 MG/5ML SUER Take  5 mLs by mouth every 12 (twelve) hours as needed for cough. Patient not taking: Reported on 02/24/2017 02/11/17   Harle Stanford., PA-C  HYDROcodone-acetaminophen (NORCO) 7.5-325 MG tablet Take 1 tablet by mouth every 6 (six) hours as needed for moderate pain. Patient not taking: Reported on 02/24/2017 12/26/16   Izora Gala, MD  levothyroxine (SYNTHROID, LEVOTHROID) 50 MCG tablet TAKE 1 TABLET BY MOUTH DAILY 09/08/16   Marletta Lor, MD  lidocaine (XYLOCAINE) 2 % solution Use as directed 20 mLs in the mouth or  throat every 3 (three) hours as needed for mouth pain. Patient not taking: Reported on 02/24/2017 12/26/16   Izora Gala, MD  lidocaine-prilocaine (EMLA) cream Apply to affected area once Patient not taking: Reported on 02/16/2017 01/01/17   Nicholas Lose, MD  LORazepam (ATIVAN) 0.5 MG tablet Take 1 tablet (0.5 mg total) by mouth every 6 (six) hours as needed (Nausea or vomiting). Patient not taking: Reported on 02/24/2017 01/01/17   Nicholas Lose, MD  magic mouthwash w/lidocaine SOLN Take 5 mLs by mouth 3 (three) times daily as needed for mouth pain. Patient not taking: Reported on 02/16/2017 01/16/17   Nicholas Lose, MD  omeprazole (PRILOSEC) 2 mg/mL SUSP Place 10 mLs (20 mg total) daily into feeding tube. Patient not taking: Reported on 02/24/2017 02/23/17 03/25/17  Patrecia Pour, MD  ondansetron (ZOFRAN) 8 MG tablet Take 1 tablet (8 mg total) by mouth 2 (two) times daily as needed for refractory nausea / vomiting. Start on day 3 after cyclophosphamide chemotherapy. Patient not taking: Reported on 02/24/2017 01/01/17   Nicholas Lose, MD  predniSONE (DELTASONE) 20 MG tablet Take 3 tablets (60 mg total) by mouth daily. Take on days 1-5 of chemotherapy. 01/01/17   Nicholas Lose, MD  prochlorperazine (COMPAZINE) 10 MG tablet Take 1 tablet (10 mg total) by mouth every 6 (six) hours as needed (Nausea or vomiting). Patient not taking: Reported on 02/24/2017 01/01/17   Nicholas Lose, MD  promethazine (PHENERGAN) 25 MG suppository Place 1 suppository (25 mg total) rectally every 6 (six) hours as needed for nausea or vomiting. Patient not taking: Reported on 01/20/2017 12/26/16   Izora Gala, MD  Rivaroxaban 15 & 20 MG TBPK Take as directed on package: Start with one 15mg  tablet by mouth twice a day with food. On Day 22, switch to one 20mg  tablet once a day with food. 02/23/17   Patrecia Pour, MD  saccharomyces boulardii (FLORASTOR) 250 MG capsule Take 1 capsule (250 mg total) 2 (two) times daily by mouth. 02/23/17    Patrecia Pour, MD  sucralfate (CARAFATE) 1 GM/10ML suspension Take 10 mLs (1 g total) by mouth 4 (four) times daily -  with meals and at bedtime. Patient not taking: Reported on 02/24/2017 12/12/16   Armbruster, Carlota Raspberry, MD    Family History Family History  Problem Relation Age of Onset  . Cancer Mother        pt unaware of what kind  . Hearing loss Mother   . Coronary artery disease Father   . Diabetes Father   . Coronary artery disease Brother   . Coronary artery disease Brother   . Diabetes Sister   . Cancer Sister        breast  . Colon cancer Neg Hx   . Stomach cancer Neg Hx   . Rectal cancer Neg Hx   . Esophageal cancer Neg Hx     Social History Social History   Tobacco Use  .  Smoking status: Former Smoker    Packs/day: 1.00    Years: 20.00    Pack years: 20.00    Last attempt to quit: 04/21/1993    Years since quitting: 23.8  . Smokeless tobacco: Never Used  Substance Use Topics  . Alcohol use: Yes    Comment: OCCAS - MAYBE ONCE A MONTH  . Drug use: No     Allergies   Patient has no known allergies.   Review of Systems Review of Systems  Constitutional: Positive for fever. Negative for chills, diaphoresis and fatigue.  HENT: Negative for congestion.   Respiratory: Positive for cough. Negative for chest tightness, shortness of breath and wheezing.   Cardiovascular: Negative for chest pain and palpitations.  Gastrointestinal: Positive for nausea and vomiting. Negative for abdominal pain, constipation and diarrhea.  Genitourinary: Negative for dysuria.  Musculoskeletal: Negative for back pain, neck pain and neck stiffness.  Skin: Negative for rash and wound.  Neurological: Negative for light-headedness and headaches.  Psychiatric/Behavioral: Negative for agitation and confusion.  All other systems reviewed and are negative.    Physical Exam Updated Vital Signs BP (!) 158/75 (BP Location: Right Arm)   Pulse (!) 117   Temp 98.5 F (36.9 C) (Oral)    Resp 20   Ht 5\' 2"  (1.575 m)   Wt 98.4 kg (217 lb)   SpO2 96%   BMI 39.69 kg/m   Physical Exam  Constitutional: She is oriented to person, place, and time. She appears well-developed and well-nourished. No distress.  HENT:  Head: Normocephalic.  Mouth/Throat: Oropharynx is clear and moist. No oropharyngeal exudate.  Eyes: Conjunctivae and EOM are normal. Pupils are equal, round, and reactive to light.  Neck: Normal range of motion. No JVD present.  Cardiovascular: Normal rate and intact distal pulses.  No murmur heard. Pulmonary/Chest: Effort normal. She has rales. She exhibits no tenderness.  Abdominal: There is no tenderness.  Musculoskeletal: She exhibits no edema or tenderness.  Neurological: She is alert and oriented to person, place, and time. No sensory deficit. She exhibits normal muscle tone.  Skin: No rash noted. She is not diaphoretic. No erythema.  Psychiatric: She has a normal mood and affect.  Nursing note and vitals reviewed.    ED Treatments / Results  Labs (all labs ordered are listed, but only abnormal results are displayed) Labs Reviewed  COMPREHENSIVE METABOLIC PANEL - Abnormal; Notable for the following components:      Result Value   Sodium 134 (*)    CO2 21 (*)    Glucose, Bld 150 (*)    Creatinine, Ser 1.07 (*)    Albumin 2.8 (*)    AST 179 (*)    ALT 197 (*)    GFR calc non Af Amer 51 (*)    GFR calc Af Amer 60 (*)    All other components within normal limits  CBC WITH DIFFERENTIAL/PLATELET - Abnormal; Notable for the following components:   WBC 15.4 (*)    RBC 3.61 (*)    Hemoglobin 10.3 (*)    HCT 31.8 (*)    RDW 19.2 (*)    Platelets 468 (*)    Neutro Abs 12.5 (*)    Monocytes Absolute 1.8 (*)    Basophils Absolute 0.2 (*)    All other components within normal limits  URINALYSIS, ROUTINE W REFLEX MICROSCOPIC - Abnormal; Notable for the following components:   APPearance HAZY (*)    Hgb urine dipstick SMALL (*)    Leukocytes, UA  SMALL  (*)    Bacteria, UA RARE (*)    Squamous Epithelial / LPF 6-30 (*)    All other components within normal limits  URINE CULTURE  CULTURE, BLOOD (ROUTINE X 2)  CULTURE, BLOOD (ROUTINE X 2)  I-STAT CG4 LACTIC ACID, ED    EKG  EKG Interpretation None       Radiology Dg Chest 2 View  Result Date: 02/24/2017 CLINICAL DATA:  Fever and cough. Patient just released from hospitalization for esophageal fistula. EXAM: CHEST  2 VIEW COMPARISON:  02/16/2017 FINDINGS: Stable position and injectable right-sided port. Stable postsurgical changes in the right chest wall. Cardiomediastinal silhouette is normal. Mediastinal contours appear intact. Calcific atherosclerotic disease of the aorta and tortuosity. There is no evidence of focal airspace consolidation, pleural effusion or pneumothorax. Low lung volumes. Osseous structures are without acute abnormality. Soft tissues are grossly normal. IMPRESSION: No active cardiopulmonary disease. Electronically Signed   By: Fidela Salisbury M.D.   On: 02/24/2017 19:41    Procedures Procedures (including critical care time)  Medications Ordered in ED Medications  ondansetron [facility]) injection 4 mg (4 mg Intravenous Given 02/24/17 2017)  sodium chloride 0.9 % bolus 500 mL (0 mLs Intravenous Stopped 02/24/17 2106)     Initial Impression / Assessment and Plan / ED Course  I have reviewed the triage vital signs and the nursing notes.  Pertinent labs & imaging results that were available during my care of the patient were reviewed by me and considered in my medical decision making (see chart for details).     Kristen Escobar is a 71 y.o. female with a past medical history significant for hypertension, hypothyroidism, pulmonary embolism on Xarelto, chronic kidney disease, and lymphoma currently on chemotherapy with tracheoesophageal fistula who presents with fever.  Patient says that she was discharged yesterday.  She reports that return precautions were  given for fevers.  She reports that she is continued to have a cough that is essentially unchanged.  She denies any nausea, vomiting, constipation, diarrhea, or dysuria.  She had a feeding tube placed during her admission and has had minimal drainage or discomfort around the site.  She denies any headache, vision changes, or other neurologic deficits.  Patient had a temperature of 100.7 at home.  On exam, lungs had mild crackles in the left lower lobe.  Lungs otherwise clear.  Chest was nontender.  Abdomen was nontender.  Patient's feeding tube has minimal drainage around it with no purulence.  No significant bleeding.  No surrounding erythema.  No CVA tenderness or back tenderness.  Legs had minimal edema.  No focal neurologic deficits.  Based on patient's recent discharge and fever today, patient will workup for infection.  Anticipate speaking with oncology team to determine disposition after workup is completed.  Diagnostic laboratory testing seen above.  Leukocytosis remains similar to prior.  Hemoglobin is improving but still anemic.  Creatinine slightly elevated at 1.07.  Lactic acid not elevated.  LFTs were elevated compared to previous values.  Family suspects this is due to the chemotherapy.  urinalysis showed leukocytes and bacteria but no evidence of nitrites.  In the absence of urinary symptoms, do not feel she has UTI.  Importantly, x-ray showed no evidence of pneumonia.  Given the patient's fever, the oncology team was called.  Based on their review of the workup results and his story, they do not feel she needs admission as there was not a clear bacterial infection present.  Patient will be instructed to  remain on her Augmentin for infection prevention.  Patient did not have evidence of abscess or problem with her abdominal tube.  Patient instructed to avoid Tylenol given her elevation in liver function.  Oncology team feels this also may be due to the chemotherapy.  They recommend having  patient call her oncologist in the morning for further management.  Both patient and family agree with plan for discharge given her demonstrated stability for several hours and her workup.  They understand return precautions.  Patient had no other questions or concerns and was discharged in good condition.   Final Clinical Impressions(s) / ED Diagnoses   Final diagnoses:  Fever, unspecified fever cause  Cough  Elevated LFTs     Clinical Impression: 1. Fever, unspecified fever cause   2. Cough   3. Elevated LFTs     Disposition: Discharge  Condition: Good  I have discussed the results, Dx and Tx plan with the pt(& family if present). He/she/they expressed understanding and agree(s) with the plan. Discharge instructions discussed at great length. Strict return precautions discussed and pt &/or family have verbalized understanding of the instructions. No further questions at time of discharge.    This SmartLink is deprecated. Use AVSMEDLIST instead to display the medication list for a patient.  Follow Up: [facility] [place] [id number] [place] [address] [phone]  If symptoms worsen     Kellie Murrill, Gwenyth Allegra, MD 02/24/17 [phone]

## 2017-02-24 NOTE — ED Triage Notes (Signed)
Pt discharged from hospital yesterday; recent feeding tube placed; concern for aspiration pneumonia; awoke with fever.

## 2017-02-25 ENCOUNTER — Other Ambulatory Visit: Payer: Self-pay | Admitting: *Deleted

## 2017-02-25 ENCOUNTER — Ambulatory Visit (HOSPITAL_BASED_OUTPATIENT_CLINIC_OR_DEPARTMENT_OTHER): Payer: Medicare Other | Admitting: Medical

## 2017-02-25 ENCOUNTER — Encounter (HOSPITAL_COMMUNITY): Payer: Self-pay | Admitting: Interventional Radiology

## 2017-02-25 ENCOUNTER — Ambulatory Visit (HOSPITAL_COMMUNITY)
Admission: RE | Admit: 2017-02-25 | Discharge: 2017-02-25 | Disposition: A | Discharge status: Home / Self Care | Payer: Medicare Other | Source: Ambulatory Visit | Attending: Medical | Admitting: Medical

## 2017-02-25 ENCOUNTER — Ambulatory Visit (HOSPITAL_COMMUNITY)
Admission: RE | Admit: 2017-02-25 | Discharge: 2017-02-25 | Disposition: A | Discharge status: Home / Self Care | Payer: Medicare Other | Source: Ambulatory Visit | Attending: Interventional Radiology | Admitting: Interventional Radiology

## 2017-02-25 VITALS — BP 168/62 | HR 106 | Temp 98.3°F | Resp 19 | Ht 62.0 in | Wt 210.0 lb

## 2017-02-25 DIAGNOSIS — K219 Gastro-esophageal reflux disease without esophagitis: Secondary | ICD-10-CM | POA: Insufficient documentation

## 2017-02-25 DIAGNOSIS — Z7952 Long term (current) use of systemic steroids: Secondary | ICD-10-CM | POA: Insufficient documentation

## 2017-02-25 DIAGNOSIS — M1990 Unspecified osteoarthritis, unspecified site: Secondary | ICD-10-CM | POA: Diagnosis not present

## 2017-02-25 DIAGNOSIS — K9423 Gastrostomy malfunction: Secondary | ICD-10-CM | POA: Insufficient documentation

## 2017-02-25 DIAGNOSIS — Z8572 Personal history of non-Hodgkin lymphomas: Secondary | ICD-10-CM | POA: Diagnosis not present

## 2017-02-25 DIAGNOSIS — Y733 Surgical instruments, materials and gastroenterology and urology devices (including sutures) associated with adverse incidents: Secondary | ICD-10-CM | POA: Insufficient documentation

## 2017-02-25 DIAGNOSIS — E669 Obesity, unspecified: Secondary | ICD-10-CM | POA: Diagnosis not present

## 2017-02-25 DIAGNOSIS — I700 Atherosclerosis of aorta: Secondary | ICD-10-CM | POA: Insufficient documentation

## 2017-02-25 DIAGNOSIS — J860 Pyothorax with fistula: Secondary | ICD-10-CM | POA: Insufficient documentation

## 2017-02-25 DIAGNOSIS — Z95828 Presence of other vascular implants and grafts: Secondary | ICD-10-CM

## 2017-02-25 DIAGNOSIS — Z8501 Personal history of malignant neoplasm of esophagus: Secondary | ICD-10-CM | POA: Diagnosis not present

## 2017-02-25 DIAGNOSIS — Z170 Estrogen receptor positive status [ER+]: Secondary | ICD-10-CM

## 2017-02-25 DIAGNOSIS — I10 Essential (primary) hypertension: Secondary | ICD-10-CM | POA: Diagnosis not present

## 2017-02-25 DIAGNOSIS — I739 Peripheral vascular disease, unspecified: Secondary | ICD-10-CM | POA: Diagnosis not present

## 2017-02-25 DIAGNOSIS — T80212S Local infection due to central venous catheter, sequela: Secondary | ICD-10-CM

## 2017-02-25 DIAGNOSIS — D72823 Leukemoid reaction: Secondary | ICD-10-CM | POA: Diagnosis not present

## 2017-02-25 DIAGNOSIS — E039 Hypothyroidism, unspecified: Secondary | ICD-10-CM | POA: Diagnosis not present

## 2017-02-25 DIAGNOSIS — R509 Fever, unspecified: Secondary | ICD-10-CM | POA: Diagnosis not present

## 2017-02-25 DIAGNOSIS — Z931 Gastrostomy status: Secondary | ICD-10-CM

## 2017-02-25 DIAGNOSIS — D729 Disorder of white blood cells, unspecified: Secondary | ICD-10-CM

## 2017-02-25 DIAGNOSIS — C50411 Malignant neoplasm of upper-outer quadrant of right female breast: Secondary | ICD-10-CM

## 2017-02-25 HISTORY — PX: IR REPLC GASTRO/COLONIC TUBE PERCUT W/FLUORO: IMG2333

## 2017-02-25 MED ORDER — BACITRACIN-NEOMYCIN-POLYMYXIN 400-5-5000 EX OINT
1.0000 "application " | TOPICAL_OINTMENT | Freq: Every day | CUTANEOUS | Status: DC
  Filled 2017-02-25 (×2): qty 1

## 2017-02-25 MED ORDER — FENTANYL CITRATE (PF) 100 MCG/2ML IJ SOLN
INTRAMUSCULAR | Status: AC
  Filled 2017-02-25: qty 4

## 2017-02-25 MED ORDER — MIDAZOLAM HCL 2 MG/2ML IJ SOLN
INTRAMUSCULAR | Status: AC
  Filled 2017-02-25: qty 6

## 2017-02-25 MED ORDER — IOPAMIDOL (ISOVUE-300) INJECTION 61%
INTRAVENOUS | Status: AC
  Filled 2017-02-25: qty 50

## 2017-02-25 MED ORDER — SODIUM CHLORIDE 0.9% FLUSH
10.0000 mL | Freq: Once | INTRAVENOUS | Status: AC
  Administered 2017-02-25: 10 mL
  Filled 2017-02-25: qty 10

## 2017-02-25 MED ORDER — LEVOFLOXACIN IN D5W 500 MG/100ML IV SOLN
500.0000 mg | Freq: Once | INTRAVENOUS | Status: AC
  Administered 2017-02-25: 500 mg via INTRAVENOUS
  Filled 2017-02-25: qty 100

## 2017-02-25 MED ORDER — MIDAZOLAM HCL 2 MG/2ML IJ SOLN
INTRAMUSCULAR | Status: AC | PRN
  Administered 2017-02-25 (×2): 1 mg via INTRAVENOUS

## 2017-02-25 MED ORDER — FENTANYL CITRATE (PF) 100 MCG/2ML IJ SOLN
INTRAMUSCULAR | Status: AC | PRN
  Administered 2017-02-25 (×2): 50 ug via INTRAVENOUS

## 2017-02-25 MED ORDER — IOPAMIDOL (ISOVUE-300) INJECTION 61%
50.0000 mL | Freq: Once | INTRAVENOUS | Status: AC | PRN
  Administered 2017-02-25: 25 mL

## 2017-02-25 MED ORDER — HEPARIN SOD (PORK) LOCK FLUSH 100 UNIT/ML IV SOLN
500.0000 [IU] | Freq: Once | INTRAVENOUS | Status: AC
  Administered 2017-02-25: 500 [IU]
  Filled 2017-02-25: qty 5

## 2017-02-25 MED ORDER — LIDOCAINE VISCOUS 2 % MT SOLN
OROMUCOSAL | Status: AC
  Filled 2017-02-25: qty 15

## 2017-02-25 MED ORDER — SODIUM CHLORIDE 0.9 % IV SOLN
Freq: Once | INTRAVENOUS | Status: AC
  Administered 2017-02-25: 13:00:00 via INTRAVENOUS

## 2017-02-25 MED ORDER — HEPARIN SOD (PORK) LOCK FLUSH 100 UNIT/ML IV SOLN
500.0000 [IU] | INTRAVENOUS | Status: AC | PRN
  Administered 2017-02-25: 500 [IU]
  Filled 2017-02-25: qty 5

## 2017-02-25 MED ORDER — LIDOCAINE VISCOUS 2 % MT SOLN
OROMUCOSAL | Status: AC | PRN
  Administered 2017-02-25: 15 mL via OROMUCOSAL

## 2017-02-25 NOTE — Progress Notes (Signed)
Referring Physician(s): Tanner,Van E.  Supervising Physician: Corrie Mckusick  Patient Status:  [facility] OP  Chief Complaint:  Malfunctioning gastrostomy tube  Subjective: Pt with history of lymphoma and tracheoesophageal fistula, status post percutaneous gastrostomy tube placement on 02/19/17.  She now presents with pain during gastrostomy tube flushes and evidence of displacement on G-tube injection today.  Scheduled now for gastrostomy tube exchange/possible new placement.  Denies fever, chest pain, respiratory difficulties, nausea vomiting or bleeding.  Does have occasional coughing. Past Medical History:  Diagnosis Date  . Arthritis   . Cancer ([place]) 03/2009   breast- rt  . GERD (gastroesophageal reflux disease)   . History of radiation therapy 07/12/10,completed   right breast 60 Gy x30 fx  . Hypertension   . Hypothyroidism   . Obesity   . Peripheral vascular disease ([place]) 1995   PT DEVELOPED CIRCULATION PROBLEMS IN BOTH HANDS AND GANGRENE OF BOTH INDEX FINGERS--REQUIRING AMPUTATION OF THE INDEX FINGERS AND VASCULAR SURGERY.  PT TOLD HER PROBLEMS RELATED TO SMOKING.   NO OTHER PROBLEMS SINCE.  Marland Kitchen Pneumonia   . Thyroid mass    Past Surgical History:  Procedure Laterality Date  . AMPUTATION     partial amputation of both index fingers  . BREAST SURGERY  2011   lumpectomy with node sampling- RIGHT  . COLONOSCOPY    . ESOPHAGOGASTRODUODENOSCOPY    . HAND SURGERY Bilateral 1995   Amputaed pointer fingers bilaterally  . IR FLUORO GUIDE PORT INSERTION RIGHT  01/14/2017  . IR GASTROSTOMY TUBE MOD SED  02/19/2017  . IR US GUIDE VASC ACCESS RIGHT  01/14/2017  . TOTAL HIP ARTHROPLASTY  12/16/2011   right hip  . TUBAL LIGATION    . VASCULAR SURGERY     both hands     Allergies: Patient has no known allergies.  Medications: Prior to Admission medications   Medication Sig Start Date End Date Taking? Authorizing Provider  allopurinol (ZYLOPRIM) 300 MG tablet Take 1 tablet (300 mg  total) by mouth daily. 01/01/17   Nicholas Lose, MD  amoxicillin-clavulanate (AUGMENTIN) 875-125 MG tablet Place 1 tablet 2 (two) times daily into feeding tube. 02/23/17   Patrecia Pour, MD  chlorpheniramine-HYDROcodone ([place]) 10-8 MG/5ML SUER Take 5 mLs by mouth every 12 (twelve) hours as needed for cough. Patient not taking: Reported on 02/24/2017 02/11/17   Harle Stanford., PA-C  HYDROcodone-acetaminophen (NORCO) 7.5-325 MG tablet Take 1 tablet by mouth every 6 (six) hours as needed for moderate pain. Patient not taking: Reported on 02/24/2017 12/26/16   Izora Gala, MD  levothyroxine (SYNTHROID, LEVOTHROID) 50 MCG tablet TAKE 1 TABLET BY MOUTH DAILY 09/08/16   Marletta Lor, MD  lidocaine-prilocaine (EMLA) cream Apply to affected area once Patient not taking: Reported on 02/16/2017 01/01/17   Nicholas Lose, MD  LORazepam (ATIVAN) 0.5 MG tablet Take 1 tablet (0.5 mg total) by mouth every 6 (six) hours as needed (Nausea or vomiting). Patient not taking: Reported on 02/24/2017 01/01/17   Nicholas Lose, MD  magic mouthwash w/lidocaine SOLN Take 5 mLs by mouth 3 (three) times daily as needed for mouth pain. Patient not taking: Reported on 02/16/2017 01/16/17   Nicholas Lose, MD  omeprazole (PRILOSEC) 20 MG capsule Take 20 mg daily by mouth.    [provider]  ondansetron (ZOFRAN) 8 MG tablet Take 1 tablet (8 mg total) by mouth 2 (two) times daily as needed for refractory nausea / vomiting. Start on day 3 after cyclophosphamide chemotherapy. Patient not taking:  Reported on 02/24/2017 01/01/17   Nicholas Lose, MD  predniSONE (DELTASONE) 20 MG tablet Take 3 tablets (60 mg total) by mouth daily. Take on days 1-5 of chemotherapy. 01/01/17   Nicholas Lose, MD  prochlorperazine (COMPAZINE) 10 MG tablet Take 1 tablet (10 mg total) by mouth every 6 (six) hours as needed (Nausea or vomiting). Patient not taking: Reported on 02/24/2017 01/01/17   Nicholas Lose, MD  promethazine (PHENERGAN) 25 MG  suppository Place 1 suppository (25 mg total) rectally every 6 (six) hours as needed for nausea or vomiting. Patient not taking: Reported on 02/24/2017 12/26/16   Izora Gala, MD  Rivaroxaban 15 & 20 MG TBPK Take as directed on package: Start with one 15mg  tablet by mouth twice a day with food. On Day 22, switch to one 20mg  tablet once a day with food. 02/23/17   Patrecia Pour, MD  saccharomyces boulardii (FLORASTOR) 250 MG capsule Take 1 capsule (250 mg total) 2 (two) times daily by mouth. Patient not taking: Reported on 02/25/2017 02/23/17   Patrecia Pour, MD  sucralfate (CARAFATE) 1 GM/10ML suspension Take 10 mLs (1 g total) by mouth 4 (four) times daily -  with meals and at bedtime. Patient not taking: Reported on 02/24/2017 12/12/16   Armbruster, Carlota Raspberry, MD     Vital Signs:pend   Physical Exam patient awake, alert.  Chest clear to auscultation bilaterally.  Heart with regular rate and rhythm.  Abdomen obese, soft, positive bowel sounds, G-tube insertion site with some mild erythema and tenderness, currently displaced.  Imaging: Dg Chest 2 View  Result Date: 02/24/2017 CLINICAL DATA:  Fever and cough. Patient just released from hospitalization for esophageal fistula. EXAM: CHEST  2 VIEW COMPARISON:  02/16/2017 FINDINGS: Stable position and injectable right-sided port. Stable postsurgical changes in the right chest wall. Cardiomediastinal silhouette is normal. Mediastinal contours appear intact. Calcific atherosclerotic disease of the aorta and tortuosity. There is no evidence of focal airspace consolidation, pleural effusion or pneumothorax. Low lung volumes. Osseous structures are without acute abnormality. Soft tissues are grossly normal. IMPRESSION: No active cardiopulmonary disease. Electronically Signed   By: Fidela Salisbury M.D.   On: 02/24/2017 19:41    Labs:  CBC: Recent Labs    02/20/17 0515 02/21/17 0610 02/23/17 0642 02/24/17 1940  WBC 11.7* 11.6* 15.5* 15.4*  HGB 8.8*  8.7* 8.4* 10.3*  HCT 26.3* 26.1* 25.5* 31.8*  PLT 190 237 307 468*    COAGS: Recent Labs    12/01/16 0823 01/14/17 0745 02/19/17 0441  INR 0.94 1.02 1.10  APTT 25 24  --     BMP: Recent Labs    02/19/17 0441 02/20/17 0515 02/23/17 0642 02/24/17 1940  NA 138 135 138 134*  K 3.1* 3.6 4.1 4.1  CL 104 106 108 101  CO2 24 22 23  21*  GLUCOSE 145* 169* 98 150*  BUN 14 16 13 16   CALCIUM 7.7* 7.7* 8.2* 8.9  CREATININE 1.18* 0.95 0.90 1.07*  GFRNONAA 46* 59* >60 51*  GFRAA 53* >60 >60 60*    LIVER FUNCTION TESTS: Recent Labs    02/19/17 0441 02/20/17 0515 02/23/17 0642 02/24/17 1940  BILITOT 0.6 0.3 0.6 0.6  AST 58* 56* 89* 179*  ALT 105* 91* 96* 197*  ALKPHOS 47 47 38 52  PROT 5.2* 5.4* 5.4* 7.0  ALBUMIN 2.1* 2.1* 2.3* 2.8*    Assessment and Plan: Pt with history of lymphoma and tracheoesophageal fistula, status post percutaneous gastrostomy tube placement on 02/19/17.  She  now presents with pain during gastrostomy tube flushes and evidence of displacement on G-tube injection today.  Scheduled now for gastrostomy tube exchange/possible new placement.  Latest imaging findings and plans discussed with patient and husband with their understanding and consent.   Electronically Signed: D. Rowe [name], PA-C 02/25/2017, 3:22 PM   I spent a total of 15 minutes at the the patient's bedside AND on the patient's hospital floor or unit, greater than 50% of which was counseling/coordinating care for gastrostomy tube exchange/possible new placement    Patient ID: Kristen Escobar, female   DOB: 06-01-1945, 71 y.o.   MRN: [id number]

## 2017-02-25 NOTE — Progress Notes (Signed)
Pt c/o inability to use gravity for feeding via PEG tube last night. When doing bolus feeding with syringe and plunger, pt experienced significant amount of pain. This occurred after pt returned from ED visit last night for fever 100.7 PEG tube bolster quite tight against pt skin. Skin erythematous and has small amount of bleeding around the tube.  Call made to Marinell Blight., Head and Neck Navigator. He is very familiar with PEG tubes/functioning anf trouble shooting. He came and evaluated PEG tube. Again pt experienced pain with bolus feeding with syringe and plunger. He recommended evaluation per IR. Sandi Mealy, Pa made aware. Pt has been unable to get am meds such as antibiotic and Xarelto.  Pt's port accessed and is being given IV levaquin while awaiting response form IR.

## 2017-02-25 NOTE — Discharge Instructions (Signed)
Moderate Conscious Sedation, Adult, Care After  These instructions provide you with information about caring for yourself after your procedure. Your health care provider may also give you more specific instructions. Your treatment has been planned according to current medical practices, but problems sometimes occur. Call your health care provider if you have any problems or questions after your procedure.  What can I expect after the procedure?  After your procedure, it is common:   To feel sleepy for several hours.   To feel clumsy and have poor balance for several hours.   To have poor judgment for several hours.   To vomit if you eat too soon.    Follow these instructions at home:  For at least 24 hours after the procedure:     Do not:  ? Participate in activities where you could fall or become injured.  ? Drive.  ? Use heavy machinery.  ? Drink alcohol.  ? Take sleeping pills or medicines that cause drowsiness.  ? Make important decisions or sign legal documents.  ? Take care of children on your own.   Rest.  Eating and drinking   Follow the diet recommended by your health care provider.   If you vomit:  ? Drink water, juice, or soup when you can drink without vomiting.  ? Make sure you have little or no nausea before eating solid foods.  General instructions   Have a responsible adult stay with you until you are awake and alert.   Take over-the-counter and prescription medicines only as told by your health care provider.   If you smoke, do not smoke without supervision.   Keep all follow-up visits as told by your health care provider. This is important.  Contact a health care provider if:   You keep feeling nauseous or you keep vomiting.   You feel light-headed.   You develop a rash.   You have a fever.  Get help right away if:   You have trouble breathing.  This information is not intended to replace advice given to you by your health care provider. Make sure you discuss any questions you have  with your health care provider.  Document Released: 01/26/2013 Document Revised: 09/10/2015 Document Reviewed: 07/28/2015  Elsevier Interactive Patient Education  2018 Elsevier Inc[employer]

## 2017-02-25 NOTE — Procedures (Signed)
Interventional Radiology Procedure Note  Procedure: Image guided rescue of misplaced g-tube.  The 47F balloon retention tube was found to be withdrawn into the body wall.   New 47F balloon retention G-tube was placed.  OK to use   Complications: None  Recommendations:  - OK to use new 47F tube - Ok to shower tomorrow - Do not submerge for 7 days - Routine care - DC home in 1 hour   Signed,  Dulcy Fanny. Earleen Newport, DO

## 2017-02-26 ENCOUNTER — Telehealth: Payer: Self-pay

## 2017-02-26 DIAGNOSIS — I129 Hypertensive chronic kidney disease with stage 1 through stage 4 chronic kidney disease, or unspecified chronic kidney disease: Secondary | ICD-10-CM | POA: Diagnosis not present

## 2017-02-26 DIAGNOSIS — Z89021 Acquired absence of right finger(s): Secondary | ICD-10-CM | POA: Diagnosis not present

## 2017-02-26 DIAGNOSIS — M1990 Unspecified osteoarthritis, unspecified site: Secondary | ICD-10-CM | POA: Diagnosis not present

## 2017-02-26 DIAGNOSIS — Z431 Encounter for attention to gastrostomy: Secondary | ICD-10-CM | POA: Diagnosis not present

## 2017-02-26 DIAGNOSIS — E039 Hypothyroidism, unspecified: Secondary | ICD-10-CM | POA: Diagnosis not present

## 2017-02-26 DIAGNOSIS — N183 Chronic kidney disease, stage 3 (moderate): Secondary | ICD-10-CM | POA: Diagnosis not present

## 2017-02-26 DIAGNOSIS — E669 Obesity, unspecified: Secondary | ICD-10-CM | POA: Diagnosis not present

## 2017-02-26 DIAGNOSIS — J860 Pyothorax with fistula: Secondary | ICD-10-CM | POA: Diagnosis not present

## 2017-02-26 DIAGNOSIS — D696 Thrombocytopenia, unspecified: Secondary | ICD-10-CM | POA: Diagnosis not present

## 2017-02-26 DIAGNOSIS — Z853 Personal history of malignant neoplasm of breast: Secondary | ICD-10-CM | POA: Diagnosis not present

## 2017-02-26 DIAGNOSIS — C8330 Diffuse large B-cell lymphoma, unspecified site: Secondary | ICD-10-CM | POA: Diagnosis not present

## 2017-02-26 DIAGNOSIS — D631 Anemia in chronic kidney disease: Secondary | ICD-10-CM | POA: Diagnosis not present

## 2017-02-26 DIAGNOSIS — Z87891 Personal history of nicotine dependence: Secondary | ICD-10-CM | POA: Diagnosis not present

## 2017-02-26 DIAGNOSIS — Z7901 Long term (current) use of anticoagulants: Secondary | ICD-10-CM | POA: Diagnosis not present

## 2017-02-26 DIAGNOSIS — Z89022 Acquired absence of left finger(s): Secondary | ICD-10-CM | POA: Diagnosis not present

## 2017-02-26 DIAGNOSIS — K219 Gastro-esophageal reflux disease without esophagitis: Secondary | ICD-10-CM | POA: Diagnosis not present

## 2017-02-26 DIAGNOSIS — I739 Peripheral vascular disease, unspecified: Secondary | ICD-10-CM | POA: Diagnosis not present

## 2017-02-26 NOTE — Telephone Encounter (Signed)
Hattie RN with [facility] called . 1) She is requesting orders for RN for PEG tube care 1x this week, 2x/week next week and then 1x every other week. 2) She is also requesting speech therapy for swallowing issues.  She is wanting to let Dr Lindi Adie know pt has stopped carafate since it was for swallowing foods and she is now on PEG feeding.  Should she resume it? 3) pt's hydralazine was stopped while she was in the hospital b/c of low blood pressure. She has not resumed it. Her BP today was 166/70.  Hattie's number [phone]

## 2017-02-26 NOTE — Telephone Encounter (Addendum)
Please advise if okay for patient to empty omeprazole capsules and take via tube instead of liquid omeprazole (not covered by insurance). See below for details.

## 2017-02-26 NOTE — Progress Notes (Signed)
[facility]pt[facility] Management [facility] Progress Note   Kristen Escobar [id number] Sep 18, 1945 71 y.o.  Kristen Escobar is managed by Dr. Nicholas Lose  Actively treated with chemotherapy: yes  Current Therapy: R CHOP  Last Treated: 02/06/2017  Assessment: Plan:    PEG tube malfunction ([place])  Fever, unspecified fever cause  Leukemoid reaction   PEG tube malfunction: The patient was evaluated by the nurse coordinator from the head and neck clinic and by the nurse from interventional radiology.  The patient was referred for a dye study and ultimately had to have her PEG tube replaced.  Fever and leukemoid reaction: The patient was given Levaquin 500 mg IV x1 since she could not take Augmentin via her PEG tube as prescribed by the emergency room yesterday.  Please see After Visit Summary for patient specific instructions.  Future Appointments  Date Time Provider [place]  02/27/2017  7:45 AM [facility] LAB 1 [facility] None  02/27/2017  8:00 AM [facility] INJ NURSE [facility] None  02/27/2017  8:30 AM Causey, Charlestine Massed, NP [facility] None  02/27/2017  9:30 AM [facility] C8 [facility] None  03/02/2017 11:15 AM Marletta Lor, MD [facility]  03/20/2017  8:30 AM [facility] LAB 5 [facility] None  03/20/2017  8:45 AM [facility] J32 DNS [facility] None  03/20/2017  9:15 AM Nicholas Lose, MD [facility] None  03/20/2017 10:15 AM [facility] C10 [facility] None  03/20/2017 11:15 AM Karie Mainland, RD [facility] None  03/24/2017  8:45 AM Marletta Lor, MD [facility]  08/04/2017  8:30 AM Nicholas Lose, MD [facility] None    No orders of the defined types were placed in this encounter.      Subjective:   Patient ID:  Kristen Escobar is a 71 y.o. (DOB [birth year]) female.  Chief Complaint: No chief complaint on file.   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 She is status post cycle 2 of R CHOP dosed on 02/06/2017.  She was last seen in our office on 02/16/2017 for increasing shortness of breath and a low-grade fever.  At that time the patient's O2 sat was noted to be at 87% on room air with tachycardia at 114.  She was given oxygen at 2 L via nasal cannula with improvement in her oxygen saturation.  Consideration was given for a CT angiogram.  Unfortunately her creatinine was elevated at 2 .6 and BUN was 41.3 today. A d-dimer was drawn which returned elevated at 10.3.  A VQ scan was completed which showed bilateral pulmonary emboli.  The patient was given Lovenox subcu and in the office and was admitted to [facility] for management.  She was started on Xarelto.  During her hospitalization a swallowing study was completed for evaluation of the patient's coughing and choking with p.o. intake.  Her swallowing study showed a TE fistula. She was made n.p.o. and had a PEG tube placed by interventional radiology.  Her PEG tube had been working until last evening.  The patient presented to the emergency room for fevers.  She was started on Augmentin via her PEG tube.Marland Kitchen  Her husband reports that he attempted to give her her medications and feeding through her PEG tube last evening and noted resistance with the patient having significant pain at the site of her PEG tube.  Medications: I have reviewed the patient's current medications.  Allergies: No Known Allergies  Past Medical History:  Diagnosis Date  .  Arthritis   . Cancer ([place]) 03/2009   breast- rt  . GERD (gastroesophageal reflux disease)   . History of radiation therapy 07/12/10,completed   right breast 60 Gy x30 fx  . Hypertension   . Hypothyroidism   . Obesity   . Peripheral vascular disease ([place]) 1995   PT DEVELOPED CIRCULATION PROBLEMS IN BOTH HANDS AND GANGRENE OF BOTH INDEX FINGERS--REQUIRING AMPUTATION OF THE INDEX FINGERS AND VASCULAR SURGERY.  PT TOLD HER PROBLEMS RELATED TO  SMOKING.   NO OTHER PROBLEMS SINCE.  Marland Kitchen Pneumonia   . Thyroid mass     Past Surgical History:  Procedure Laterality Date  . AMPUTATION     partial amputation of both index fingers  . BREAST SURGERY  2011   lumpectomy with node sampling- RIGHT  . COLONOSCOPY    . ESOPHAGOGASTRODUODENOSCOPY    . HAND SURGERY Bilateral 1995   Amputaed pointer fingers bilaterally  . IR FLUORO GUIDE PORT INSERTION RIGHT  01/14/2017  . IR GASTROSTOMY TUBE MOD SED  02/19/2017  . IR REPLC GASTRO/COLONIC TUBE PERCUT W/FLUORO  02/25/2017  . IR US GUIDE VASC ACCESS RIGHT  01/14/2017  . TOTAL HIP ARTHROPLASTY  12/16/2011   right hip  . TUBAL LIGATION    . VASCULAR SURGERY     both hands    Family History  Problem Relation Age of Onset  . Cancer Mother        pt unaware of what kind  . Hearing loss Mother   . Coronary artery disease Father   . Diabetes Father   . Coronary artery disease Brother   . Coronary artery disease Brother   . Diabetes Sister   . Cancer Sister        breast  . Colon cancer Neg Hx   . Stomach cancer Neg Hx   . Rectal cancer Neg Hx   . Esophageal cancer Neg Hx     Social History   Socioeconomic History  . Marital status: Married    Spouse name: Not on file  . Number of children: Not on file  . Years of education: Not on file  . Highest education level: Not on file  Social Needs  . Financial resource strain: Not on file  . Food insecurity - worry: Not on file  . Food insecurity - inability: Not on file  . Transportation needs - medical: Not on file  . Transportation needs - non-medical: Not on file  Occupational History  . Not on file  Tobacco Use  . Smoking status: Former Smoker    Packs/day: 1.00    Years: 20.00    Pack years: 20.00    Last attempt to quit: 04/21/1993    Years since quitting: 23.8  . Smokeless tobacco: Never Used  Substance and Sexual Activity  . Alcohol use: Yes    Comment: OCCAS - MAYBE ONCE A MONTH  . Drug use: No  . Sexual activity: Yes    Other Topics Concern  . Not on file  Social History Narrative  . Not on file    Past Medical History, Surgical history, Social history, and Family history were reviewed and updated as appropriate.   Please see review of systems for further details on the patient's review from today.   Review of Systems:  Review of Systems  Constitutional: Positive for fever. Negative for chills and diaphoresis.  HENT: Positive for trouble swallowing.   Gastrointestinal: Positive for abdominal pain. Negative for abdominal distention, constipation, diarrhea, nausea and vomiting.  Objective:   Physical Exam:  There were no vitals taken for this visit. ECOG: 1  Physical Exam  Constitutional: No distress.  HENT:  Head: Normocephalic and atraumatic.  Mouth/Throat: Oropharynx is clear and moist. No oropharyngeal exudate.  Eyes: Right eye exhibits no discharge. Left eye exhibits no discharge. No scleral icterus.  Cardiovascular: S1 normal and S2 normal. Tachycardia present.  Pulmonary/Chest: Effort normal and breath sounds normal. No respiratory distress. She has no wheezes. She has no rales.  Abdominal: Soft. Bowel sounds are normal. She exhibits no distension. There is no tenderness. There is no rebound and no guarding.    Musculoskeletal: She exhibits no edema.  Neurological: She is alert. Coordination normal.  Skin: Skin is warm and dry. She is not diaphoretic.    Lab Review:     Component Value Date/Time   NA 134 (L) 02/24/2017 1940   NA 134 (L) 02/16/2017 1056   K 4.1 02/24/2017 1940   K 4.0 02/16/2017 1056   CL 101 02/24/2017 1940   CL 105 03/10/2012 0859   CO2 21 (L) 02/24/2017 1940   CO2 22 02/16/2017 1056   GLUCOSE 150 (H) 02/24/2017 1940   GLUCOSE 245 (H) 02/16/2017 1056   GLUCOSE 149 (H) 03/10/2012 0859   BUN 16 02/24/2017 1940   BUN 41.3 (H) 02/16/2017 1056   CREATININE 1.07 (H) 02/24/2017 1940   CREATININE 2.6 (H) 02/16/2017 1056   CALCIUM 8.9 02/24/2017 1940    CALCIUM 9.2 02/16/2017 1056   PROT 7.0 02/24/2017 1940   PROT 6.8 02/16/2017 1056   ALBUMIN 2.8 (L) 02/24/2017 1940   ALBUMIN 2.6 (L) 02/16/2017 1056   AST 179 (H) 02/24/2017 1940   AST 91 (H) 02/16/2017 1056   ALT 197 (H) 02/24/2017 1940   ALT 218 (H) 02/16/2017 1056   ALKPHOS 52 02/24/2017 1940   ALKPHOS 69 02/16/2017 1056   BILITOT 0.6 02/24/2017 1940   BILITOT 0.67 02/16/2017 1056   GFRNONAA 51 (L) 02/24/2017 1940   GFRAA 60 (L) 02/24/2017 1940       Component Value Date/Time   WBC 15.4 (H) 02/24/2017 1940   RBC 3.61 (L) 02/24/2017 1940   HGB 10.3 (L) 02/24/2017 1940   HGB 11.0 (L) 02/16/2017 1056   HCT 31.8 (L) 02/24/2017 1940   HCT 33.6 (L) 02/16/2017 1056   PLT 468 (H) 02/24/2017 1940   PLT 126 (L) 02/16/2017 1056   MCV 88.1 02/24/2017 1940   MCV 86.1 02/16/2017 1056   MCH 28.5 02/24/2017 1940   MCHC 32.4 02/24/2017 1940   RDW 19.2 (H) 02/24/2017 1940   RDW 16.2 (H) 02/16/2017 1056   LYMPHSABS 0.9 02/24/2017 1940   LYMPHSABS 0.8 (L) 02/16/2017 1056   MONOABS 1.8 (H) 02/24/2017 1940   MONOABS 0.6 02/16/2017 1056   EOSABS 0.0 02/24/2017 1940   EOSABS 0.0 02/16/2017 1056   EOSABS 0.4 05/02/2009 1517   BASOSABS 0.2 (H) 02/24/2017 1940   BASOSABS 0.1 02/16/2017 1056   -------------------------------  Imaging from last 24 hours (if applicable):  Radiology interpretation: Dg Chest 2 View  Result Date: 02/24/2017 CLINICAL DATA:  Fever and cough. Patient just released from hospitalization for esophageal fistula. EXAM: CHEST  2 VIEW COMPARISON:  02/16/2017 FINDINGS: Stable position and injectable right-sided port. Stable postsurgical changes in the right chest wall. Cardiomediastinal silhouette is normal. Mediastinal contours appear intact. Calcific atherosclerotic disease of the aorta and tortuosity. There is no evidence of focal airspace consolidation, pleural effusion or pneumothorax. Low lung volumes. Osseous structures are  without acute abnormality. Soft tissues  are grossly normal. IMPRESSION: No active cardiopulmonary disease. Electronically Signed   By: Fidela Salisbury M.D.   On: 02/24/2017 19:41   Dg Chest 2 View  Result Date: 02/16/2017 CLINICAL DATA:  Dyspnea on exertion. Hypoxia. Progressive shortness breath for 2 days. Esophageal cancer. EXAM: CHEST  2 VIEW COMPARISON:  Chest film associated with Port-A-Cath insertion 01/14/2017. PET scan 12/08/2016. FINDINGS: Heart size is normal. Lung volumes are low. There is no edema or effusion. No focal airspace disease present. Aortic atherosclerosis is noted. A right IJ Port-A-Cath is stable. Surgical clips are noted in the right breast and axilla. The upper abdomen is unremarkable. IMPRESSION: 1. No acute cardiopulmonary disease. 2. Low lung volumes. Electronically Signed   By: San Morelle M.D.   On: 02/16/2017 13:05   Ir Gastrostomy Tube Mod Sed  Result Date: 02/19/2017 INDICATION: 71 year old with lymphoma and tracheoesophageal fistula. Patient needs a gastrostomy tube in order to avoid the fistula. EXAM: PERCUTANEOUS GASTROSTOMY TUBE WITH FLUOROSCOPIC GUIDANCE Physician: Stephan Minister. Anselm Pancoast, MD MEDICATIONS: Ancef 2 g; Antibiotics were administered within 1 hour of the procedure. Glucagon 0.5 mg IV ANESTHESIA/SEDATION: Versed 2.0 mg IV; Fentanyl 100 mcg IV Moderate Sedation Time:  37 minutes The patient was continuously monitored during the procedure by the interventional radiology nurse under my direct supervision. FLUOROSCOPY TIME:  Fluoroscopy Time: 5 minutes 6 seconds (98 mGy). COMPLICATIONS: None immediate. PROCEDURE: Informed consent was obtained for a percutaneous gastrostomy tube. The patient was placed on the interventional table. Fluoroscopy demonstrated gas in the transverse colon. An orogastric tube was placed with fluoroscopic guidance without complication. The anterior abdomen was prepped and draped in sterile fashion. Maximal barrier sterile technique was utilized including caps, mask, sterile  gowns, sterile gloves, sterile drape, hand hygiene and skin antiseptic. Stomach was inflated with air through the orogastric tube. The skin and subcutaneous tissues were anesthetized with 1% lidocaine. A total of three SAF-T-[id number] T-fasteners were placed in the stomach using fluoroscopic guidance. Small incision was made between the T-fasteners. Needle was directed into the stomach with fluoroscopic guidance between the T-fasteners. A small amount of contrast was injected to confirm placement in the stomach. Wire was advanced into the stomach. The tract was dilated to accommodate an 18 French peel-away sheath. A 16 French Entuit gastrostomy tube was easily advanced over the wire and a peel-away sheath was removed. The balloon was inflated with 6 mL of saline. Contrast injection confirmed placement in the stomach. The gastrostomy tube was flushed with normal saline. Fluoroscopic images were taken and saved for this procedure. IMPRESSION: Successful fluoroscopic guided percutaneous gastrostomy tube placement. Electronically Signed   By: Markus Daft M.D.   On: 02/19/2017 18:49   Nm Pulmonary Perf And Vent  Addendum Date: 02/16/2017   ADDENDUM REPORT: 02/16/2017 20:22 ADDENDUM: Initial attempts to contact Hospitalists by pager at [place]. Ultimately, Critical Value/emergent results were called by telephone at the time of interpretation on 02/16/2017 at 8:20 pm to Nurse [facility], who verbally acknowledged these results. Electronically Signed   By: Suzy Bouchard M.D.   On: 02/16/2017 20:22   Result Date: 02/16/2017 CLINICAL DATA:  Elevated D-dimer. Esophageal carcinoma. Elevated white count. Short of breath. EXAM: NUCLEAR MEDICINE VENTILATION - PERFUSION LUNG SCAN TECHNIQUE: Ventilation images were obtained in multiple projections using inhaled aerosol Tc-28m DTPA. Perfusion images were obtained in multiple projections after intravenous injection of Tc-46m MAA. RADIOPHARMACEUTICALS:  28.8 mCi Technetium-64m DTPA  aerosol inhalation and 4.1 mCi Technetium-85m MAA IV COMPARISON:  Chest radiograph 02/16/2017 FINDINGS: Ventilation: Informed ventilation per Perfusion: There are subtle wedge-shaped perfusion defects in the LEFT RIGHT upper lobe. Smaller defects at the LEFT and RIGHT lung base posteriorly. These peripheral perfusion defects are unmatched to ventilation. IMPRESSION: Bilateral un matched wedge-shaped peripheral perfusion defects most consistent with acute pulmonary embolism. Electronically Signed: By: Suzy Bouchard M.D. On: 02/16/2017 19:50   Ir Replc Gastro/colonic Tube Percut W/fluoro  Result Date: 02/25/2017 INDICATION: 72 year old female with a history of tracheoesophageal fistula, esophageal carcinoma, and recently placed percutaneous gastrostomy tube. Gastrostomy was placed 02/19/2017, and has been symptomatic, potentially withdrawn. The patient presents for evaluating the tube and possible replacement. EXAM: IMAGE GUIDED REPLACEMENT OF DISPLACED PERCUTANEOUS GASTROSTOMY MEDICATIONS: None ANESTHESIA/SEDATION: Versed 2.0 mg IV; Fentanyl 100 mcg IV Moderate Sedation Time:  20 minutes The patient was continuously monitored during the procedure by the interventional radiology nurse under my direct supervision. CONTRAST:  25 cc - administered into the gastric lumen. FLUOROSCOPY TIME:  Fluoroscopy Time:  minutes  seconds ( mGy). COMPLICATIONS: None PROCEDURE: Informed written consent was obtained from the patient after a thorough discussion of the procedural risks, benefits and alternatives. All questions were addressed. Maximal Sterile Barrier Technique was utilized including caps, mask, sterile gowns, sterile gloves, sterile drape, hand hygiene and skin antiseptic. A timeout was performed prior to the initiation of the procedure. Patient was positioned supine position on the fluoroscopy table. The indwelling percutaneous gastrostomy tube was injected in AP and lateral, confirming that the tube had been  withdrawn from the stomach lumen, into the abdominal wall. The upper abdomen and the G-tube were then prepped and draped in the usual sterile fashion. Using moderate sedation, the tube was replaced. The balloon remained inflated, and a 5 [place] Kumpe the catheter with contrast was used to navigate through the existing tract into the gastric lumen. Once the catheter was confirmed within the gastric lumen, a Rosen wire was placed. The catheter and the existing gastrostomy to were removed from the tract. Dilation of the soft tissue was performed with 16 [place] dilator. Catheter was advanced over the [facility] wire, and the wire was exchanged for Amplatz wire. 39 French peel-away was then placed over the Amplatz wire into the stomach lumen. With the coaxial wire in place, the peel-away dilator was removed, the Kumpe catheter was inserted through the lumen of the new 52 French balloon retention Intuit gastrostomy tube, and then the coaxial Kumpe the catheter and gastrostomy were inserted through the peel-away sheath into the stomach. Peel-away sheath was removed. Contrast injected through the med port of the gastrostomy tube confirmed location. The wire and coaxial catheter were removed after inflation of the balloon retention. Final image was stored. Patient tolerated the procedure well and remained hemodynamically stable throughout. No complications were encountered and no significant blood loss. IMPRESSION: Status post exchange/rescue of a displaced percutaneous gastrostomy, with placement of a new 72 French balloon retention tube. Signed, Dulcy Fanny. Earleen Newport, DO [facility] Radiology Electronically Signed   By: Corrie Mckusick D.O.   On: 02/25/2017 16:32   Dg Swallowing Func-speech Pathology  Result Date: 02/17/2017 CLINICAL DATA:  71 year old female diagnosed with Lymphoma including a mass at the left tracheoesophageal groove near the thyroid. Multiple imaging guided  biopsies were performed, but ultimately a surgical biopsy of the mass was necessary on 12/26/2016 for diagnosis. The patient is now status post chemotherapy, with an unexplained cough, coughing with p.o. intake. EXAM: MODIFIED BARIUM SWALLOW TECHNIQUE: Different consistencies  of barium were administered orally to the patient by the Speech Pathologist. Imaging of the pharynx was performed in the lateral projection. FLUOROSCOPY TIME:  Fluoroscopy Time:  0 minutes 54 seconds Radiation Exposure Index (if provided by the fluoroscopic device): 1.8mG y Number of Acquired Spot Images: 0 COMPARISON:  Neck CT 11/21/2016. FINDINGS: Nectar thick liquid- the patient is able to swallow liquid into the cervical esophagus, but with each swallow there is barium extension from the cervical esophagus (C5/C6 level) through the posterior wall of the trachea into the tracheal lumen. This occurs about 18 mm below the level of the glottis (series 5, image 4). 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 PO barium liquid freely passed through the fistula and into the airway with each swallow. Study discussed by telephone with Dr. Hosie Poisson on 02/17/2017 at 1245 hours. Please refer to the Speech Pathologists report for complete details and recommendations. Electronically Signed   By: Genevie Ann M.D.   On: 02/17/2017 13:28

## 2017-02-27 ENCOUNTER — Ambulatory Visit (HOSPITAL_BASED_OUTPATIENT_CLINIC_OR_DEPARTMENT_OTHER): Payer: Medicare Other

## 2017-02-27 ENCOUNTER — Ambulatory Visit (HOSPITAL_COMMUNITY)
Admission: RE | Admit: 2017-02-27 | Discharge: 2017-02-27 | Disposition: A | Discharge status: Home / Self Care | Payer: Medicare Other | Source: Ambulatory Visit | Attending: Adult Health | Admitting: Adult Health

## 2017-02-27 ENCOUNTER — Other Ambulatory Visit (HOSPITAL_BASED_OUTPATIENT_CLINIC_OR_DEPARTMENT_OTHER): Payer: Medicare Other

## 2017-02-27 ENCOUNTER — Ambulatory Visit: Payer: Medicare Other

## 2017-02-27 ENCOUNTER — Telehealth: Payer: Self-pay

## 2017-02-27 ENCOUNTER — Encounter: Payer: Self-pay | Admitting: Adult Health

## 2017-02-27 ENCOUNTER — Ambulatory Visit (HOSPITAL_BASED_OUTPATIENT_CLINIC_OR_DEPARTMENT_OTHER): Payer: Medicare Other | Admitting: Adult Health

## 2017-02-27 VITALS — BP 104/61 | HR 77 | Temp 98.7°F | Resp 18

## 2017-02-27 VITALS — BP 148/65 | HR 91 | Temp 98.0°F | Resp 17 | Ht 62.0 in | Wt 210.7 lb

## 2017-02-27 DIAGNOSIS — Z5112 Encounter for antineoplastic immunotherapy: Secondary | ICD-10-CM

## 2017-02-27 DIAGNOSIS — Z5189 Encounter for other specified aftercare: Secondary | ICD-10-CM | POA: Diagnosis not present

## 2017-02-27 DIAGNOSIS — C8330 Diffuse large B-cell lymphoma, unspecified site: Secondary | ICD-10-CM | POA: Diagnosis not present

## 2017-02-27 DIAGNOSIS — C50411 Malignant neoplasm of upper-outer quadrant of right female breast: Secondary | ICD-10-CM

## 2017-02-27 DIAGNOSIS — T80212S Local infection due to central venous catheter, sequela: Secondary | ICD-10-CM

## 2017-02-27 DIAGNOSIS — C8331 Diffuse large B-cell lymphoma, lymph nodes of head, face, and neck: Secondary | ICD-10-CM

## 2017-02-27 DIAGNOSIS — Z9889 Other specified postprocedural states: Secondary | ICD-10-CM | POA: Insufficient documentation

## 2017-02-27 DIAGNOSIS — Z95828 Presence of other vascular implants and grafts: Secondary | ICD-10-CM

## 2017-02-27 DIAGNOSIS — Z5111 Encounter for antineoplastic chemotherapy: Secondary | ICD-10-CM | POA: Diagnosis not present

## 2017-02-27 DIAGNOSIS — Z170 Estrogen receptor positive status [ER+]: Secondary | ICD-10-CM

## 2017-02-27 DIAGNOSIS — R109 Unspecified abdominal pain: Secondary | ICD-10-CM | POA: Diagnosis not present

## 2017-02-27 LAB — URINE CULTURE: Culture: 30000 — AB

## 2017-02-27 LAB — CBC WITH DIFFERENTIAL/PLATELET
BASO%: 0.5 % (ref 0.0–2.0)
BASOS ABS: 0.1 10*3/uL (ref 0.0–0.1)
EOS%: 1 % (ref 0.0–7.0)
Eosinophils Absolute: 0.1 10*3/uL (ref 0.0–0.5)
HCT: 28.4 % — ABNORMAL LOW (ref 34.8–46.6)
HEMOGLOBIN: 9 g/dL — AB (ref 11.6–15.9)
LYMPH#: 0.7 10*3/uL — AB (ref 0.9–3.3)
LYMPH%: 5.3 % — AB (ref 14.0–49.7)
MCH: 28 pg (ref 25.1–34.0)
MCHC: 31.7 g/dL (ref 31.5–36.0)
MCV: 88.5 fL (ref 79.5–101.0)
MONO#: 1.4 10*3/uL — AB (ref 0.1–0.9)
MONO%: 11.1 % (ref 0.0–14.0)
NEUT#: 10.3 10*3/uL — ABNORMAL HIGH (ref 1.5–6.5)
NEUT%: 82.1 % — ABNORMAL HIGH (ref 38.4–76.8)
Platelets: 462 10*3/uL — ABNORMAL HIGH (ref 145–400)
RBC: 3.21 10*6/uL — AB (ref 3.70–5.45)
RDW: 18.7 % — AB (ref 11.2–14.5)
WBC: 12.5 10*3/uL — ABNORMAL HIGH (ref 3.9–10.3)
nRBC: 0 % (ref 0–0)

## 2017-02-27 LAB — COMPREHENSIVE METABOLIC PANEL
ALBUMIN: 2.3 g/dL — AB (ref 3.5–5.0)
ALK PHOS: 48 U/L (ref 40–150)
ALT: 210 U/L — ABNORMAL HIGH (ref 0–55)
ANION GAP: 11 meq/L (ref 3–11)
AST: 130 U/L — ABNORMAL HIGH (ref 5–34)
BILIRUBIN TOTAL: 0.49 mg/dL (ref 0.20–1.20)
BUN: 13 mg/dL (ref 7.0–26.0)
CO2: 22 mEq/L (ref 22–29)
Calcium: 9.1 mg/dL (ref 8.4–10.4)
Chloride: 104 mEq/L (ref 98–109)
Creatinine: 0.9 mg/dL (ref 0.6–1.1)
GLUCOSE: 136 mg/dL (ref 70–140)
POTASSIUM: 3.8 meq/L (ref 3.5–5.1)
SODIUM: 136 meq/L (ref 136–145)
Total Protein: 6.3 g/dL — ABNORMAL LOW (ref 6.4–8.3)

## 2017-02-27 MED ORDER — VINCRISTINE SULFATE CHEMO INJECTION 1 MG/ML
2.0000 mg | Freq: Once | INTRAVENOUS | Status: AC
  Administered 2017-02-27: 2 mg via INTRAVENOUS
  Filled 2017-02-27: qty 2

## 2017-02-27 MED ORDER — PEGFILGRASTIM 6 MG/0.6ML ~~LOC~~ PSKT
6.0000 mg | PREFILLED_SYRINGE | Freq: Once | SUBCUTANEOUS | Status: AC
  Administered 2017-02-27: 6 mg via SUBCUTANEOUS
  Filled 2017-02-27: qty 0.6

## 2017-02-27 MED ORDER — DOXORUBICIN HCL CHEMO IV INJECTION 2 MG/ML
50.0000 mg/m2 | Freq: Once | INTRAVENOUS | Status: AC
Start: 2017-02-27 — End: 2017-02-27
  Administered 2017-02-27: 108 mg via INTRAVENOUS
  Filled 2017-02-27: qty 54

## 2017-02-27 MED ORDER — HEPARIN SOD (PORK) LOCK FLUSH 100 UNIT/ML IV SOLN
500.0000 [IU] | Freq: Once | INTRAVENOUS | Status: AC | PRN
  Administered 2017-02-27: 500 [IU]
  Filled 2017-02-27: qty 5

## 2017-02-27 MED ORDER — PALONOSETRON HCL INJECTION 0.25 MG/5ML
0.2500 mg | Freq: Once | INTRAVENOUS | Status: AC
  Administered 2017-02-27: 0.25 mg via INTRAVENOUS

## 2017-02-27 MED ORDER — SODIUM CHLORIDE 0.9 % IV SOLN
750.0000 mg/m2 | Freq: Once | INTRAVENOUS | Status: AC
  Administered 2017-02-27: 1620 mg via INTRAVENOUS
  Filled 2017-02-27: qty 81

## 2017-02-27 MED ORDER — SODIUM CHLORIDE 0.9% FLUSH
10.0000 mL | Freq: Once | INTRAVENOUS | Status: AC
  Administered 2017-02-27: 10 mL
  Filled 2017-02-27: qty 10

## 2017-02-27 MED ORDER — PALONOSETRON HCL INJECTION 0.25 MG/5ML
INTRAVENOUS | Status: AC
  Filled 2017-02-27: qty 5

## 2017-02-27 MED ORDER — DIPHENHYDRAMINE HCL 12.5 MG/5ML PO ELIX
50.0000 mg | ORAL_SOLUTION | Freq: Once | ORAL | Status: DC
  Filled 2017-02-27: qty 20

## 2017-02-27 MED ORDER — SODIUM CHLORIDE 0.9% FLUSH
10.0000 mL | INTRAVENOUS | Status: DC | PRN
  Administered 2017-02-27: 10 mL
  Filled 2017-02-27: qty 10

## 2017-02-27 MED ORDER — ACETAMINOPHEN 325 MG PO TABS
ORAL_TABLET | ORAL | Status: AC
  Filled 2017-02-27: qty 2

## 2017-02-27 MED ORDER — DIPHENHYDRAMINE HCL 25 MG PO CAPS
50.0000 mg | ORAL_CAPSULE | Freq: Once | ORAL | Status: AC
  Administered 2017-02-27: 50 mg via ORAL

## 2017-02-27 MED ORDER — DIPHENHYDRAMINE HCL 25 MG PO CAPS
ORAL_CAPSULE | ORAL | Status: AC
  Filled 2017-02-27: qty 2

## 2017-02-27 MED ORDER — SODIUM CHLORIDE 0.9 % IV SOLN
Freq: Once | INTRAVENOUS | Status: AC
  Administered 2017-02-27: 10:00:00 via INTRAVENOUS

## 2017-02-27 MED ORDER — DEXAMETHASONE SODIUM PHOSPHATE 10 MG/ML IJ SOLN
INTRAMUSCULAR | Status: AC
  Filled 2017-02-27: qty 1

## 2017-02-27 MED ORDER — RITUXIMAB CHEMO INJECTION 500 MG/50ML
375.0000 mg/m2 | Freq: Once | INTRAVENOUS | Status: AC
  Administered 2017-02-27: 800 mg via INTRAVENOUS
  Filled 2017-02-27: qty 50

## 2017-02-27 MED ORDER — DEXAMETHASONE SODIUM PHOSPHATE 10 MG/ML IJ SOLN
10.0000 mg | Freq: Once | INTRAMUSCULAR | Status: AC
  Administered 2017-02-27: 10 mg via INTRAVENOUS

## 2017-02-27 MED ORDER — ACETAMINOPHEN 325 MG PO TABS
650.0000 mg | ORAL_TABLET | Freq: Once | ORAL | Status: AC
  Administered 2017-02-27: 650 mg via ORAL

## 2017-02-27 NOTE — Telephone Encounter (Signed)
Will follow up. Thank you.

## 2017-02-27 NOTE — Assessment & Plan Note (Signed)
Diffuse large B cell lymphoma ([place]) 12/26/2016 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  She seems to be recovering well, and looks great today.  We reviewed her mild discomfort lateral to her g tube.  I will get an abdominal film just to ensure everything is ok.  She will proceed with chemotherapy.  Her LFTs are elevated today.  Her T bili is normal.  I reviewed this with Dr. Burr Medico, who is making the appropriate adjustments with her dosing and she will proceed with treatment.

## 2017-02-27 NOTE — Progress Notes (Signed)
Ok to treat with elevated LFT's per [facility] office note, dose to be adjusted by Dr. Burr Medico.

## 2017-02-27 NOTE — Patient Instructions (Signed)
Implanted Port Home Guide An implanted port is a type of central line that is placed under the skin. Central lines are used to provide IV access when treatment or nutrition needs to be given through a person's veins. Implanted ports are used for long-term IV access. An implanted port may be placed because:  You need IV medicine that would be irritating to the small veins in your hands or arms.  You need long-term IV medicines, such as antibiotics.  You need IV nutrition for a long period.  You need frequent blood draws for lab tests.  You need dialysis.  Implanted ports are usually placed in the chest area, but they can also be placed in the upper arm, the abdomen, or the leg. An implanted port has two main parts:  Reservoir. The reservoir is round and will appear as a small, raised area under your skin. The reservoir is the part where a needle is inserted to give medicines or draw blood.  Catheter. The catheter is a thin, flexible tube that extends from the reservoir. The catheter is placed into a large vein. Medicine that is inserted into the reservoir goes into the catheter and then into the vein.  How will I care for my incision site? Do not get the incision site wet. Bathe or shower as directed by your health care provider. How is my port accessed? Special steps must be taken to access the port:  Before the port is accessed, a numbing cream can be placed on the skin. This helps numb the skin over the port site.  Your health care provider uses a sterile technique to access the port. ? Your health care provider must put on a mask and sterile gloves. ? The skin over your port is cleaned carefully with an antiseptic and allowed to dry. ? The port is gently pinched between sterile gloves, and a needle is inserted into the port.  Only "non-coring" port needles should be used to access the port. Once the port is accessed, a blood return should be checked. This helps ensure that the port  is in the vein and is not clogged.  If your port needs to remain accessed for a constant infusion, a clear (transparent) bandage will be placed over the needle site. The bandage and needle will need to be changed every week, or as directed by your health care provider.  Keep the bandage covering the needle clean and dry. Do not get it wet. Follow your health care provider's instructions on how to take a shower or bath while the port is accessed.  If your port does not need to stay accessed, no bandage is needed over the port.  What is flushing? Flushing helps keep the port from getting clogged. Follow your health care provider's instructions on how and when to flush the port. Ports are usually flushed with saline solution or a medicine called heparin. The need for flushing will depend on how the port is used.  If the port is used for intermittent medicines or blood draws, the port will need to be flushed: ? After medicines have been given. ? After blood has been drawn. ? As part of routine maintenance.  If a constant infusion is running, the port may not need to be flushed.  How long will my port stay implanted? The port can stay in for as long as your health care provider thinks it is needed. When it is time for the port to come out, surgery will be   done to remove it. The procedure is similar to the one performed when the port was put in. When should I seek immediate medical care? When you have an implanted port, you should seek immediate medical care if:  You notice a bad smell coming from the incision site.  You have swelling, redness, or drainage at the incision site.  You have more swelling or pain at the port site or the surrounding area.  You have a fever that is not controlled with medicine.  This information is not intended to replace advice given to you by your health care provider. Make sure you discuss any questions you have with your health care provider. Document  Released: 04/07/2005 Document Revised: 09/13/2015 Document Reviewed: 12/13/2012 Elsevier Interactive Patient Education  2017 Elsevier Inc.  

## 2017-02-27 NOTE — Progress Notes (Signed)
[facility] Follow up:    Kristen Lor, MD 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 Radiation recall related to tamoxifen      11/25/2010 -  Anti-estrogen oral therapy    Aromasin 25 mg daily.myalgias and arthralgias stop November 2012 went back to tamoxifen 20 mg daily      01/06/2017 Pathology Results    Bone marrow biopsy: Slightly hypercellular bone marrow for age with granulocytic hyperplasia, numerous small lymphoid aggregates present. 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 positive for LCA, CD20, CD79a PAX-5, BCL-6 and weak cytoplasmic kappa associated with patchy weak positivity for BCL-2 and CD10, Ki-67 40%      01/16/2017 -  Chemotherapy    R CHOP 6 cycles       CURRENT THERAPY:R CHOP  INTERVAL HISTORY: Kristen Escobar 71 y.o. female returns for evaluation prior to receiving chemotherapy with R CHOP.  She has had a hectic past few weeks.  She has started  treatment for PEs with Xarelto, she had a low grade fever on Tuesday and underwent full work up in the ER that was negative, then on Wednesday, she was noted to have a malfunctioning G tube.  She had the G tube replaced, and since then has been feeling better.  Home health is coming out to see her and she is working with physical therapy and getting around well.  She is still working on increasing her walking distance and endurance, and can't take too long of walks.  She Is using the G tube, and her weight is up 4 pounds since 10/29.  She and her husband are still working on getting into a routine of how to administer her medications, but otherwise, she is well and without concerns.  She has remained afebrile.  She has some mild soreness at her abdomen, but no tenderness, warmth noted.     Patient Active Problem List   Diagnosis Date Noted  . Breast cancer of upper-outer quadrant of right female breast ([place]) 03/21/2011    Priority: High  . 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 Pulmonary embolism ([place]) 02/22/2017  . AKI (acute kidney injury) ([place]) 02/16/2017  . Acute on chronic respiratory failure with hypoxia ([place]) 02/16/2017  . Acute respiratory failure with hypoxia ([place]) 02/16/2017  . Port or reservoir infection 01/20/2017  . Port-A-Cath in place 01/20/2017  . Diffuse large B cell lymphoma ([place]) 01/01/2017  . Neck mass 12/26/2016  . Esophageal mass 11/27/2016  .  Hypothyroidism 03/25/2016  . Bilateral carotid bruits 08/22/2015  . Impaired glucose tolerance 08/22/2015  . Chronic kidney disease 08/22/2015  . Benign paroxysmal positional vertigo 02/05/2013  . S/P left THA, AA 01/20/2012  . Osteoarthritis 09/24/2011  . Morbid obesity ([place]) 08/02/2009  . Essential hypertension 08/02/2009    has No Known Allergies.  MEDICAL HISTORY: Past Medical History:  Diagnosis Date  . Arthritis   . Cancer ([place]) 03/2009   breast- rt  . GERD (gastroesophageal reflux disease)   .  History of radiation therapy 07/12/10,completed   right breast 60 Gy x30 fx  . Hypertension   . Hypothyroidism   . Obesity   . Peripheral vascular disease ([place]) 1995   PT DEVELOPED CIRCULATION PROBLEMS IN BOTH HANDS AND GANGRENE OF BOTH INDEX FINGERS--REQUIRING AMPUTATION OF THE INDEX FINGERS AND VASCULAR SURGERY.  PT TOLD HER PROBLEMS RELATED TO SMOKING.   NO OTHER PROBLEMS SINCE.  Marland Kitchen Pneumonia   . Thyroid mass     SURGICAL HISTORY: Past Surgical History:  Procedure Laterality Date  . AMPUTATION     partial amputation of both index fingers  . BREAST SURGERY  2011   lumpectomy with node sampling- RIGHT  . COLONOSCOPY    . ESOPHAGOGASTRODUODENOSCOPY    . HAND SURGERY Bilateral 1995   Amputaed pointer fingers bilaterally  . IR FLUORO GUIDE PORT INSERTION RIGHT  01/14/2017  . IR GASTROSTOMY TUBE MOD SED  02/19/2017  . IR REPLC GASTRO/COLONIC TUBE PERCUT W/FLUORO  02/25/2017  . IR US GUIDE VASC ACCESS RIGHT  01/14/2017  . TOTAL HIP ARTHROPLASTY  12/16/2011   right hip  . TUBAL LIGATION    . VASCULAR SURGERY     both hands    SOCIAL HISTORY: Social History   Socioeconomic History  . Marital status: Married    Spouse name: Not on file  . Number of children: Not on file  . Years of education: Not on file  . Highest education level: Not on file  Social Needs  . Financial resource strain: Not on file  . Food insecurity - worry: Not on file  . Food insecurity - inability: Not on file  . Transportation needs - medical: Not on file  . Transportation needs - non-medical: Not on file  Occupational History  . Not on file  Tobacco Use  . Smoking status: Former Smoker    Packs/day: 1.00    Years: 20.00    Pack years: 20.00    Last attempt to quit: 04/21/1993    Years since quitting: 23.8  . Smokeless tobacco: Never Used  Substance and Sexual Activity  . Alcohol use: Yes    Comment: OCCAS - MAYBE ONCE A MONTH  . Drug use: No  . Sexual activity: Yes  Other Topics Concern  . Not on  file  Social History Narrative  . Not on file    FAMILY HISTORY: Family History  Problem Relation Age of Onset  . Cancer Mother        pt unaware of what kind  . Hearing loss Mother   . Coronary artery disease Father   . Diabetes Father   . Coronary artery disease Brother   . Coronary artery disease Brother   . Diabetes Sister   . Cancer Sister        breast  . Colon cancer Neg Hx   . Stomach cancer Neg Hx   . Rectal cancer Neg Hx   . Esophageal cancer Neg Hx     Review  of Systems  Constitutional: Negative for appetite change, chills, fatigue and fever.  HENT:   Negative for hearing loss and lump/mass.   Eyes: Negative for eye problems and icterus.  Respiratory: Positive for cough (since diagnosis). Negative for chest tightness and shortness of breath.   Cardiovascular: Negative for chest pain, leg swelling and palpitations.  Gastrointestinal: Positive for vomiting (only when has to much via her G tube). Negative for abdominal distention, abdominal pain, constipation, diarrhea, nausea and rectal pain.  Endocrine: Negative for hot flashes.  Musculoskeletal: Negative for gait problem.  Neurological: Negative for dizziness, extremity weakness, gait problem, headaches, light-headedness and numbness.  Hematological: Negative for adenopathy. Does not bruise/bleed easily.  Psychiatric/Behavioral: Negative for depression. The patient is not nervous/anxious.       PHYSICAL EXAMINATION  ECOG PERFORMANCE STATUS: 2 - Symptomatic, <50% confined to bed  Vitals:   02/27/17 0824  BP: (!) 148/65  Pulse: 91  Resp: 17  Temp: 98 F (36.7 C)  SpO2: 98%    Physical Exam  Constitutional: She is oriented to person, place, and time and well-developed, well-nourished, and in no distress.  HENT:  Head: Normocephalic and atraumatic.  Mouth/Throat: Oropharynx is clear and moist. No oropharyngeal exudate.  Eyes: Pupils are equal, round, and reactive to light. No scleral icterus.  Neck:  Neck supple.  Cardiovascular: Normal rate, regular rhythm and normal heart sounds.  Pulmonary/Chest: Effort normal and breath sounds normal. No respiratory distress. She has no wheezes.  Abdominal: Soft. Bowel sounds are normal. She exhibits no distension. There is no tenderness. There is no rebound and no guarding.  G tube noted in abdomen, some mild SS drainage on the dressing noted with erythema surrounding the insertion site, this has been present per the patient and her husband since insertion on Wednesday.  There is no warmth, tenderness, or swelling noted.    Lymphadenopathy:    She has no cervical adenopathy.  Neurological: She is alert and oriented to person, place, and time.  Skin: Skin is warm and dry. No rash noted.  Psychiatric: Mood and affect normal.    LABORATORY DATA:  CBC    Component Value Date/Time   WBC 12.5 (H) 02/27/2017 0802   WBC 15.4 (H) 02/24/2017 1940   RBC 3.21 (L) 02/27/2017 0802   RBC 3.61 (L) 02/24/2017 1940   HGB 9.0 (L) 02/27/2017 0802   HCT 28.4 (L) 02/27/2017 0802   PLT 462 (H) 02/27/2017 0802   MCV 88.5 02/27/2017 0802   MCH 28.0 02/27/2017 0802   MCH 28.5 02/24/2017 1940   MCHC 31.7 02/27/2017 0802   MCHC 32.4 02/24/2017 1940   RDW 18.7 (H) 02/27/2017 0802   LYMPHSABS 0.7 (L) 02/27/2017 0802   MONOABS 1.4 (H) 02/27/2017 0802   EOSABS 0.1 02/27/2017 0802   EOSABS 0.4 05/02/2009 1517   BASOSABS 0.1 02/27/2017 0802    CMP     Component Value Date/Time   NA 136 02/27/2017 0802   K 3.8 02/27/2017 0802   CL 101 02/24/2017 1940   CL 105 03/10/2012 0859   CO2 22 02/27/2017 0802   GLUCOSE 136 02/27/2017 0802   GLUCOSE 149 (H) 03/10/2012 0859   BUN 13.0 02/27/2017 0802   CREATININE 0.9 02/27/2017 0802   CALCIUM 9.1 02/27/2017 0802   PROT 6.3 (L) 02/27/2017 0802   ALBUMIN 2.3 (L) 02/27/2017 0802   AST 130 (H) 02/27/2017 0802   ALT 210 (H) 02/27/2017 0802   ALKPHOS 48 02/27/2017 0802   BILITOT 0.49 02/27/2017  0802   GFRNONAA 51 (L)  02/24/2017 1940   GFRAA 60 (L) 02/24/2017 1940     RADIOGRAPHIC STUDIES:  Ir Replc Gastro/colonic Tube Percut W/fluoro  Result Date: 02/25/2017 INDICATION: 71 year old female with a history of tracheoesophageal fistula, esophageal carcinoma, and recently placed percutaneous gastrostomy tube. Gastrostomy was placed 02/19/2017, and has been symptomatic, potentially withdrawn. The patient presents for evaluating the tube and possible replacement. EXAM: IMAGE GUIDED REPLACEMENT OF DISPLACED PERCUTANEOUS GASTROSTOMY MEDICATIONS: None ANESTHESIA/SEDATION: Versed 2.0 mg IV; Fentanyl 100 mcg IV Moderate Sedation Time:  20 minutes The patient was continuously monitored during the procedure by the interventional radiology nurse under my direct supervision. CONTRAST:  25 cc - administered into the gastric lumen. FLUOROSCOPY TIME:  Fluoroscopy Time:  minutes  seconds ( mGy). COMPLICATIONS: None PROCEDURE: Informed written consent was obtained from the patient after a thorough discussion of the procedural risks, benefits and alternatives. All questions were addressed. Maximal Sterile Barrier Technique was utilized including caps, mask, sterile gowns, sterile gloves, sterile drape, hand hygiene and skin antiseptic. A timeout was performed prior to the initiation of the procedure. Patient was positioned supine position on the fluoroscopy table. The indwelling percutaneous gastrostomy tube was injected in AP and lateral, confirming that the tube had been withdrawn from the stomach lumen, into the abdominal wall. The upper abdomen and the G-tube were then prepped and draped in the usual sterile fashion. Using moderate sedation, the tube was replaced. The balloon remained inflated, and a 5 [place] Kumpe the catheter with contrast was used to navigate through the existing tract into the gastric lumen. Once the catheter was confirmed within the gastric lumen, a Rosen wire was placed. The catheter and the existing gastrostomy to  were removed from the tract. Dilation of the soft tissue was performed with 16 [place] dilator. Catheter was advanced over the [facility] wire, and the wire was exchanged for Amplatz wire. 52 French peel-away was then placed over the Amplatz wire into the stomach lumen. With the coaxial wire in place, the peel-away dilator was removed, the Kumpe catheter was inserted through the lumen of the new 58 French balloon retention Intuit gastrostomy tube, and then the coaxial Kumpe the catheter and gastrostomy were inserted through the peel-away sheath into the stomach. Peel-away sheath was removed. Contrast injected through the med port of the gastrostomy tube confirmed location. The wire and coaxial catheter were removed after inflation of the balloon retention. Final image was stored. Patient tolerated the procedure well and remained hemodynamically stable throughout. No complications were encountered and no significant blood loss. IMPRESSION: Status post exchange/rescue of a displaced percutaneous gastrostomy, with placement of a new 29 French balloon retention tube. Signed, Dulcy Fanny. Earleen Newport, DO [facility] Radiology Electronically Signed   By: Corrie Mckusick D.O.   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 positive for LCA, CD20, CD79a PAX-5, BCL-6 and weak cytoplasmic kappa associated with patchy weak positivity for BCL-2 and CD10, Ki-67 40%  Bone marrow biopsy negative for diffuse large B cell lymphoma but suggestive of small low-grade B cell population possibly SLL/CLL Revised IPI score: 1, good prognosis, redictated for years PFS 80%, predicted overall survival 79% ------------------------------------------------------------------------ Current treatment: R CHOP 6 cycles today is cycle 3  day 1  Kristen Escobar is doing remarkably well today considering  what she has been through over the past few weeks.  She seems to be recovering well, and looks great today.  We reviewed her mild discomfort lateral to her g tube.  I will get an abdominal film just to ensure everything is ok.  She will proceed with chemotherapy.  Her LFTs are elevated today.  Her T bili is normal.  I reviewed this with Dr. Burr Medico, who is making the appropriate adjustments with her dosing and she will proceed with treatment.     Orders Placed This Encounter  Procedures  . DG Abd 2 Views    Standing Status:   Future    Number of Occurrences:   1    Standing Expiration Date:   02/27/2018    Order Specific Question:   Reason for Exam (SYMPTOM  OR DIAGNOSIS REQUIRED)    Answer:   abd discomfort, recent G tube placement    Order Specific Question:   Preferred imaging location?    Answer:   [facility]    Order Specific Question:   Radiology Contrast Protocol - do NOT remove file path    Answer:   [email or web address]    All questions were answered. The patient knows to call the clinic with any problems, questions or concerns. We can certainly see the patient much sooner if necessary.  A total of (30) minutes of face-to-face time was spent with this patient with greater than 50% of that time in counseling and care-coordination.  This note was electronically signed. Scot Dock, NP 02/27/2017

## 2017-02-27 NOTE — Patient Instructions (Signed)
[facility] Discharge Instructions for Patients Receiving Chemotherapy  Today you received the following chemotherapy agents rituxan/adriamycin/cytoxan/vincristine  To help prevent nausea and vomiting after your treatment, we encourage you to take your nausea medication as directed  If you develop nausea and vomiting that is not controlled by your nausea medication, call the clinic.   BELOW ARE SYMPTOMS THAT SHOULD BE REPORTED IMMEDIATELY:  *FEVER GREATER THAN 100.5 F  *CHILLS WITH OR WITHOUT FEVER  NAUSEA AND VOMITING THAT IS NOT CONTROLLED WITH YOUR NAUSEA MEDICATION  *UNUSUAL SHORTNESS OF BREATH  *UNUSUAL BRUISING OR BLEEDING  TENDERNESS IN MOUTH AND THROAT WITH OR WITHOUT PRESENCE OF ULCERS  *URINARY PROBLEMS  *BOWEL PROBLEMS  UNUSUAL RASH Items with * indicate a potential emergency and should be followed up as soon as possible.  Feel free to call the clinic should you have any questions or concerns. The clinic phone number is [phone].  Please show the [place] at check-in to the Emergency Department and triage nurse.

## 2017-02-27 NOTE — Telephone Encounter (Signed)
Left VM message for [facility] RN for verbal orders on RN PEG tube care and speech therapy. Direct line given if she has any other questions.  Cyndia Bent RN

## 2017-02-28 ENCOUNTER — Telehealth: Payer: Self-pay

## 2017-02-28 NOTE — Telephone Encounter (Signed)
Post ED Visit - Positive Culture Follow-up  Culture report reviewed by antimicrobial stewardship pharmacist:  []  Elenor Quinones, Pharm.D. []  Heide Guile, Pharm.D., BCPS AQ-ID [x]  Parks Neptune, Pharm.D., BCPS []  Alycia Rossetti, Pharm.D., BCPS []  [place], Pharm.D., BCPS, AAHIVP []  Legrand Como, Pharm.D., BCPS, AAHIVP []  Salome Arnt, PharmD, BCPS []  Dimitri Ped, PharmD, BCPS []  Vincenza Hews, PharmD, BCPS  Positive urine culture Treated with Augmentin, organism sensitive to the same and no further patient follow-up is required at this time.  Genia Del 02/28/2017, 9:57 AM

## 2017-03-01 LAB — CULTURE, BLOOD (ROUTINE X 2)
CULTURE: NO GROWTH
Culture: NO GROWTH
Special Requests: ADEQUATE

## 2017-03-02 ENCOUNTER — Ambulatory Visit (INDEPENDENT_AMBULATORY_CARE_PROVIDER_SITE_OTHER): Payer: Medicare Other | Admitting: Internal Medicine

## 2017-03-02 ENCOUNTER — Encounter: Payer: Self-pay | Admitting: Internal Medicine

## 2017-03-02 VITALS — BP 142/68 | HR 120 | Temp 98.2°F | Ht 62.0 in | Wt 209.4 lb

## 2017-03-02 DIAGNOSIS — J9621 Acute and chronic respiratory failure with hypoxia: Secondary | ICD-10-CM

## 2017-03-02 DIAGNOSIS — J860 Pyothorax with fistula: Secondary | ICD-10-CM

## 2017-03-02 DIAGNOSIS — I2782 Chronic pulmonary embolism: Secondary | ICD-10-CM | POA: Diagnosis not present

## 2017-03-02 DIAGNOSIS — C8330 Diffuse large B-cell lymphoma, unspecified site: Secondary | ICD-10-CM

## 2017-03-02 DIAGNOSIS — N183 Chronic kidney disease, stage 3 unspecified: Secondary | ICD-10-CM

## 2017-03-02 DIAGNOSIS — I10 Essential (primary) hypertension: Secondary | ICD-10-CM

## 2017-03-02 MED ORDER — LEVOTHYROXINE SODIUM 50 MCG PO TABS: 50.0000 ug | ORAL_TABLET | Freq: Every day | ORAL | 1 refills | Status: DC

## 2017-03-02 NOTE — Progress Notes (Signed)
Subjective:    Patient ID: Kristen Escobar, female    DOB: [birth year], 71 y.o.   MRN: [id number]  HPI 71 year old patient who is seen today following a recent hospital discharge. She is followed closely by oncology with a recent diagnosis of diffuse large B-cell lymphoma.  She received a cycle of chemotherapy 3 days ago. 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  She has been started on anticoagulation. The patient was seen by ENT due to hoarseness and a neck mass. She has a history of essential hypertension but antihypertensives are on hold at the present time.  She has treated hypothyroidism.  She continues to have cough and pulmonary secretions. She is completing Augmentin antibiotic therapy and for the past 24 hours has had some loose diarrheal stool.  She had fever last week but no recurrent temperature elevation over the weekend.  She is status post PEG  Past Medical History:  Diagnosis Date  . Arthritis   . Cancer ([place]) 03/2009   breast- rt  . GERD (gastroesophageal reflux disease)   . History of radiation therapy 07/12/10,completed   right breast 60 Gy x30 fx  . Hypertension   . Hypothyroidism   . Obesity   . Peripheral vascular disease ([place]) 1995   PT DEVELOPED CIRCULATION PROBLEMS IN BOTH HANDS AND GANGRENE OF BOTH INDEX FINGERS--REQUIRING AMPUTATION OF THE INDEX FINGERS AND VASCULAR SURGERY.  PT TOLD HER PROBLEMS RELATED TO SMOKING.   NO OTHER PROBLEMS SINCE.  Marland Kitchen Pneumonia   . Thyroid mass      Social History   Socioeconomic History  . Marital status: Married    Spouse name: Not on file  . Number of children: Not on file  . Years of education: Not on file  . Highest education level: Not on file  Social Needs  . Financial resource strain: Not on file  . Food insecurity - worry: Not on file  . Food insecurity - inability: Not on file  . Transportation needs - medical: Not on file  . Transportation needs - non-medical: Not on file    Occupational History  . Not on file  Tobacco Use  . Smoking status: Former Smoker    Packs/day: 1.00    Years: 20.00    Pack years: 20.00    Last attempt to quit: 04/21/1993    Years since quitting: 23.8  . Smokeless tobacco: Never Used  Substance and Sexual Activity  . Alcohol use: Yes    Comment: OCCAS - MAYBE ONCE A MONTH  . Drug use: No  . Sexual activity: Yes  Other Topics Concern  . Not on file  Social History Narrative  . Not on file    Past Surgical History:  Procedure Laterality Date  . AMPUTATION     partial amputation of both index fingers  . BREAST SURGERY  2011   lumpectomy with node sampling- RIGHT  . COLONOSCOPY    . ESOPHAGOGASTRODUODENOSCOPY    . HAND SURGERY Bilateral 1995   Amputaed pointer fingers bilaterally  . IR FLUORO GUIDE PORT INSERTION RIGHT  01/14/2017  . IR GASTROSTOMY TUBE MOD SED  02/19/2017  . IR REPLC GASTRO/COLONIC TUBE PERCUT W/FLUORO  02/25/2017  . IR US GUIDE VASC ACCESS RIGHT  01/14/2017  . TOTAL HIP ARTHROPLASTY  12/16/2011   right hip  . TUBAL LIGATION    . VASCULAR SURGERY     both hands    Family History  Problem Relation Age of  Onset  . Cancer Mother        pt unaware of what kind  . Hearing loss Mother   . Coronary artery disease Father   . Diabetes Father   . Coronary artery disease Brother   . Coronary artery disease Brother   . Diabetes Sister   . Cancer Sister        breast  . Colon cancer Neg Hx   . Stomach cancer Neg Hx   . Rectal cancer Neg Hx   . Esophageal cancer Neg Hx     No Known Allergies  Current Outpatient Medications on File Prior to Visit  Medication Sig Dispense Refill  . allopurinol (ZYLOPRIM) 300 MG tablet Take 1 tablet (300 mg total) by mouth daily. 30 tablet 3  . lidocaine-prilocaine (EMLA) cream Apply to affected area once 30 g 3  . LORazepam (ATIVAN) 0.5 MG tablet Take 1 tablet (0.5 mg total) by mouth every 6 (six) hours as needed (Nausea or vomiting). 30 tablet 0  . magic mouthwash  w/lidocaine SOLN Take 5 mLs by mouth 3 (three) times daily as needed for mouth pain. 100 mL 1  . omeprazole (PRILOSEC) 20 MG capsule Take 20 mg daily by mouth.    . ondansetron (ZOFRAN) 8 MG tablet Take 1 tablet (8 mg total) by mouth 2 (two) times daily as needed for refractory nausea / vomiting. Start on day 3 after cyclophosphamide chemotherapy. 30 tablet 1  . predniSONE (DELTASONE) 20 MG tablet Take 3 tablets (60 mg total) by mouth daily. Take on days 1-5 of chemotherapy. 90 tablet 0  . prochlorperazine (COMPAZINE) 10 MG tablet Take 1 tablet (10 mg total) by mouth every 6 (six) hours as needed (Nausea or vomiting). 30 tablet 6  . promethazine (PHENERGAN) 25 MG suppository Place 1 suppository (25 mg total) rectally every 6 (six) hours as needed for nausea or vomiting. 12 suppository 1  . Rivaroxaban 15 & 20 MG TBPK Take as directed on package: Start with one 15mg  tablet by mouth twice a day with food. On Day 22, switch to one 20mg  tablet once a day with food. 51 each 0  . saccharomyces boulardii (FLORASTOR) 250 MG capsule Take 1 capsule (250 mg total) 2 (two) times daily by mouth. 28 capsule 0  . sucralfate (CARAFATE) 1 GM/10ML suspension Take 10 mLs (1 g total) by mouth 4 (four) times daily -  with meals and at bedtime. 420 mL 3   No current facility-administered medications on file prior to visit.     BP (!) 142/68 (BP Location: Right Arm, Patient Position: Sitting, Cuff Size: Large)   Pulse (!) 120   Temp 98.2 F (36.8 C) (Oral)   Ht 5\' 2"  (1.575 m)   Wt 209 lb 6.4 oz (95 kg)   SpO2 94%   BMI 38.30 kg/m       Review of Systems  Constitutional: Positive for fatigue. Negative for chills and fever.  HENT: Negative for congestion, dental problem, hearing loss, rhinorrhea, sinus pressure, sore throat and tinnitus.   Eyes: Negative for pain, discharge and visual disturbance.  Respiratory: Positive for cough. Negative for shortness of breath.   Cardiovascular: Negative for chest pain,  palpitations and leg swelling.  Gastrointestinal: Positive for diarrhea and vomiting. Negative for abdominal distention, abdominal pain, blood in stool, constipation and nausea.  Genitourinary: Negative for difficulty urinating, dysuria, flank pain, frequency, hematuria, pelvic pain, urgency, vaginal bleeding, vaginal discharge and vaginal pain.  Musculoskeletal: Negative for arthralgias, gait problem  and joint swelling.  Skin: Negative for rash.  Neurological: Negative for dizziness, syncope, speech difficulty, weakness, numbness and headaches.  Hematological: Negative for adenopathy.  Psychiatric/Behavioral: Negative for agitation, behavioral problems and dysphoric mood. The patient is not nervous/anxious.        Objective:   Physical Exam  Constitutional: She is oriented to person, place, and time. She appears well-developed and well-nourished.  Weight 209 Frequent paroxysms of cough Afebrile O2 saturation 94% Blood pressure 130/70  HENT:  Head: Normocephalic.  Right Ear: External ear normal.  Left Ear: External ear normal.  Mouth/Throat: Oropharynx is clear and moist.  Eyes: Conjunctivae and EOM are normal. Pupils are equal, round, and reactive to light.  Neck: Normal range of motion. Neck supple. No thyromegaly present.  Cardiovascular: Normal rate, regular rhythm, normal heart sounds and intact distal pulses.  Pulmonary/Chest: Breath sounds normal. No respiratory distress. She has no wheezes. She has no rales.  Abdominal: Soft. Bowel sounds are normal. She exhibits no mass. There is no tenderness.  Musculoskeletal: Normal range of motion.  Lymphadenopathy:    She has no cervical adenopathy.  Neurological: She is alert and oriented to person, place, and time.  Skin: Skin is warm and dry. No rash noted.  Psychiatric: She has a normal mood and affect. Her behavior is normal.          Assessment & Plan:   Diffuse large B-cell lymphoma presenting as a neck mass  T-E  fistula.  Plan is to postpone surgical repair until patient more stable and has completed chemotherapy.  Follow-up ENT and oncology Essential hypertension.  Presently stable off meds we will continue to observe Onset diarrhea.  Patient has a single dose remaining of Augmentin.  If diarrhea worsens or she develops fever, we will check for C. difficile colitis.    ENT follow-up Oncology follow-up Return here in 3 months or as needed  Nyoka Cowden

## 2017-03-02 NOTE — Patient Instructions (Signed)
Limit your sodium (Salt) intake  Please check your blood pressure on a regular basis.  If it is consistently greater than 150/90, please make an office appointment.  Return in 3 months for follow-up  Oncology and ENT follow-up as scheduled  Please check your blood pressure on a regular basis.  If it is consistently greater than 150/90, please make an office appointment   Or call for recommendations.

## 2017-03-03 ENCOUNTER — Telehealth: Payer: Self-pay

## 2017-03-03 ENCOUNTER — Telehealth: Payer: Self-pay | Admitting: Medical

## 2017-03-03 DIAGNOSIS — C8330 Diffuse large B-cell lymphoma, unspecified site: Secondary | ICD-10-CM | POA: Diagnosis not present

## 2017-03-03 DIAGNOSIS — N183 Chronic kidney disease, stage 3 (moderate): Secondary | ICD-10-CM | POA: Diagnosis not present

## 2017-03-03 DIAGNOSIS — J860 Pyothorax with fistula: Secondary | ICD-10-CM | POA: Diagnosis not present

## 2017-03-03 DIAGNOSIS — Z431 Encounter for attention to gastrostomy: Secondary | ICD-10-CM | POA: Diagnosis not present

## 2017-03-03 DIAGNOSIS — D631 Anemia in chronic kidney disease: Secondary | ICD-10-CM | POA: Diagnosis not present

## 2017-03-03 DIAGNOSIS — I129 Hypertensive chronic kidney disease with stage 1 through stage 4 chronic kidney disease, or unspecified chronic kidney disease: Secondary | ICD-10-CM | POA: Diagnosis not present

## 2017-03-03 NOTE — Telephone Encounter (Signed)
Scheduled appt per 11/13 sch msg - confirmed appt with patients husband

## 2017-03-03 NOTE — Telephone Encounter (Signed)
inbasket sent for visit tomorrow with [facility], not just fluids.

## 2017-03-03 NOTE — Telephone Encounter (Signed)
Patty with [facility] called with update on PEG tube. The PEG is 9 week old and was dislodged and already replaced x1.  It is red under the flange. The pt is changing the gauze about 4x/day. The drainage is purulent and blood tinged. She finished a round of antibiotic yesterday. She has an appt with [facility] for fluids tomorrow 11/14.

## 2017-03-03 NOTE — Telephone Encounter (Signed)
Called pt to follow up on her carafate and blood pressure medication question. Pt husband states that pt saw pcp yesterday and feels that she does not need to resume hydralazine at this time. Her bp had been in the 120's sys over 60's. Dr.Gudena would like for pt to resume carafate at this time. Pt husband verbalized understanding.  Pt husband reports that pt was doing well with her tube feeds until today. Pt started to vomit shortly after her tube feedings.Husband believes it was coming from the chemo. She has not been taking any nausea medication since she had been doing so well through the weekend. PCP advised that she takes zofran, over compazine through the peg tube. Told pt husband that it is best to give her zofran on schedule every 6hrs throughout this week, since this is 1 week post chemo. Pt was also reported to have episodes of diarrhea x2, which was resolved with imodium.  [place] nurse reports that since pt had episode of diarrhea and vomiting in the last day or so, that pt had decreased fluid intake. Suggesting the pt need for IV hydration with symptom management. Will arrange this for tomorrow and on Friday. Dr.Gudena and SM (Beth,RN) was notified and is aware of plan.   Message sent to scheduling for SM (IV[facility]) tomorrow 11/14 and 11/16 (lab/SM). Told pt husband that he will be receiving a call to confirm time and dates. Verbalized understanding and has no further needs at this time.

## 2017-03-04 ENCOUNTER — Ambulatory Visit (HOSPITAL_BASED_OUTPATIENT_CLINIC_OR_DEPARTMENT_OTHER): Payer: Medicare Other | Admitting: Medical

## 2017-03-04 VITALS — BP 133/57 | HR 82 | Temp 99.1°F | Resp 21 | Ht 62.0 in | Wt 209.1 lb

## 2017-03-04 DIAGNOSIS — C8330 Diffuse large B-cell lymphoma, unspecified site: Secondary | ICD-10-CM | POA: Diagnosis not present

## 2017-03-04 DIAGNOSIS — Z5189 Encounter for other specified aftercare: Secondary | ICD-10-CM | POA: Diagnosis not present

## 2017-03-04 DIAGNOSIS — I129 Hypertensive chronic kidney disease with stage 1 through stage 4 chronic kidney disease, or unspecified chronic kidney disease: Secondary | ICD-10-CM | POA: Diagnosis not present

## 2017-03-04 DIAGNOSIS — J860 Pyothorax with fistula: Secondary | ICD-10-CM | POA: Diagnosis not present

## 2017-03-04 DIAGNOSIS — Z95828 Presence of other vascular implants and grafts: Secondary | ICD-10-CM

## 2017-03-04 DIAGNOSIS — C8331 Diffuse large B-cell lymphoma, lymph nodes of head, face, and neck: Secondary | ICD-10-CM | POA: Diagnosis not present

## 2017-03-04 DIAGNOSIS — T80212S Local infection due to central venous catheter, sequela: Secondary | ICD-10-CM

## 2017-03-04 DIAGNOSIS — R1112 Projectile vomiting: Secondary | ICD-10-CM

## 2017-03-04 DIAGNOSIS — K209 Esophagitis, unspecified without bleeding: Secondary | ICD-10-CM

## 2017-03-04 DIAGNOSIS — Z431 Encounter for attention to gastrostomy: Secondary | ICD-10-CM | POA: Diagnosis not present

## 2017-03-04 DIAGNOSIS — D631 Anemia in chronic kidney disease: Secondary | ICD-10-CM | POA: Diagnosis not present

## 2017-03-04 DIAGNOSIS — R112 Nausea with vomiting, unspecified: Secondary | ICD-10-CM

## 2017-03-04 DIAGNOSIS — T451X5A Adverse effect of antineoplastic and immunosuppressive drugs, initial encounter: Secondary | ICD-10-CM

## 2017-03-04 DIAGNOSIS — C50411 Malignant neoplasm of upper-outer quadrant of right female breast: Secondary | ICD-10-CM

## 2017-03-04 DIAGNOSIS — Z170 Estrogen receptor positive status [ER+]: Secondary | ICD-10-CM

## 2017-03-04 DIAGNOSIS — N183 Chronic kidney disease, stage 3 (moderate): Secondary | ICD-10-CM | POA: Diagnosis not present

## 2017-03-04 MED ORDER — SUCRALFATE 1 G PO TABS: 1.0000 g | ORAL_TABLET | Freq: Three times a day (TID) | ORAL | 1 refills | Status: DC

## 2017-03-04 MED ORDER — SODIUM CHLORIDE 0.9 % IV SOLN
Freq: Once | INTRAVENOUS | Status: AC
  Administered 2017-03-04: 11:00:00 via INTRAVENOUS

## 2017-03-04 MED ORDER — SODIUM CHLORIDE 0.9% FLUSH
10.0000 mL | Freq: Once | INTRAVENOUS | Status: AC
  Administered 2017-03-04: 10 mL
  Filled 2017-03-04: qty 10

## 2017-03-04 MED ORDER — ONDANSETRON HCL 8 MG PO TABS: 8.0000 mg | ORAL_TABLET | Freq: Three times a day (TID) | ORAL | 1 refills | Status: DC | PRN

## 2017-03-04 MED ORDER — METOCLOPRAMIDE HCL 5 MG PO TABS: 5.0000 mg | ORAL_TABLET | Freq: Four times a day (QID) | ORAL | 1 refills | Status: DC

## 2017-03-04 MED ORDER — HEPARIN SOD (PORK) LOCK FLUSH 100 UNIT/ML IV SOLN
500.0000 [IU] | Freq: Once | INTRAVENOUS | Status: AC
  Administered 2017-03-04: 500 [IU]
  Filled 2017-03-04: qty 5

## 2017-03-04 NOTE — Patient Instructions (Signed)
Gastrostomy Tube Home Guide, Adult  A gastrostomy tube is a tube that is surgically placed into the stomach. It is also called a “G-tube.” G-tubes are used when a person is unable to eat and drink enough on their own to stay healthy. The tube is inserted into the stomach through a small cut (incision) in the skin. This tube is used for:  · Feeding.  · Giving medication.    Gastrostomy tube care  · Wash your hands with soap and water.  · Remove the old dressing (if any). Some styles of G-tubes may need a dressing inserted between the skin and the G-tube. Other types of G-tubes do not require a dressing. Ask your health care provider if a dressing is needed.  · Check the area where the tube enters the skin (insertion site) for redness, swelling, or pus-like (purulent) drainage. A small amount of clear or tan liquid drainage is normal. Check to make sure scar tissue (skin) is not growing around the insertion site. This could have a raised, bumpy appearance.  · A cotton swab can be used to clean the skin around the tube:  ? When the G-tube is first put in, a normal saline solution or water can be used to clean the skin.  ? Mild soap and warm water can be used when the skin around the G-tube site has healed.  ? Roll the cotton swab around the G-tube insertion site to remove any drainage or crusting at the insertion site.  Stomach residuals  Feeding tube residuals are the amount of liquids that are in the stomach at any given time. Residuals may be checked before giving feedings, medications, or as instructed by your health care provider.  · Ask your health care provider if there are instances when you would not start tube feedings depending on the amount or type of contents withdrawn from the stomach.  · Check residuals by attaching a syringe to the G-tube and pulling back on the syringe plunger. Note the amount, and return the residual back into the stomach.    Flushing the G-tube  · The G-tube should be periodically  flushed with clean warm water to keep it from clogging.  ? Flush the G-tube after feedings or medications. Draw up 30 mL of warm water in a syringe. Connect the syringe to the G-tube and slowly push the water into the tube.  ? Do not push feedings, medications, or flushes rapidly. Flush the G-tube gently and slowly.  ? Only use syringes made for G-tubes to flush medications or feedings.  ? Your health care provider may want the G-tube flushed more often or with more water. If this is the case, follow your health care provider's instructions.  Feedings  Your health care provider will determine whether feedings are given as a bolus (a certain amount given at one time and at scheduled times) or whether feedings will be given continuously on a feeding pump.  · Formulas should be given at room temperature.  · If feedings are continuous, no more than 4 hours worth of feedings should be placed in the feeding bag. This helps prevent spoilage or accidental excess infusion.  · Cover and place unused formula in the refrigerator.  · If feedings are continuous, stop the feedings when medications or flushes are given. Be sure to restart the feedings.  · Feeding bags and syringes should be replaced as instructed by your health care provider.    Giving medication  · In general, it   is best if all medications are in a liquid form for G-tube administration. Liquid medications are less likely to clog the G-tube.  ? Mix the liquid medication with 30 mL (or amount recommended by your health care provider) of warm water.  ? Draw up the medication into the syringe.  ? Attach the syringe to the G-tube and slowly push the mixture into the G-tube.  ? After giving the medication, draw up 30 mL of warm water in the syringe and slowly flush the G-tube.  · For pills or capsules, check with your health care provider first before crushing medications. Some pills are not effective if they are crushed. Some capsules are sustained-release  medications.  ? If appropriate, crush the pill or capsule and mix with 30 mL of warm water. Using the syringe, slowly push the medication through the tube, then flush the tube with another 30 mL of tap water.  G-tube problems  G-tube was pulled out.  · Cause: May have been pulled out accidentally.  · Solutions: Cover the opening with clean dressing and tape. Call your health care provider right away. The G-tube should be put in as soon as possible (within 4 hours) so the G-tube opening (tract) does not close. The G-tube needs to be put in at a health care setting. An X-ray needs to be done to confirm placement before the G-tube can be used again.    Redness, irritation, soreness, or foul odor around the gastrostomy site.  · Cause: May be caused by leakage or infection.  · Solutions: Call your health care provider right away.    Large amount of leakage of fluid or mucus-like liquid present (a large amount means it soaks clothing).  · Cause: Many reasons could cause the G-tube to leak.  · Solutions: Call your health care provider to discuss the amount of leakage.    Skin or scar tissue appears to be growing where tube enters skin.  · Cause: Tissue growth may develop around the insertion site if the G-tube is moved or pulled on excessively.  · Solutions: Secure tube with tape so that excess movement does not occur. Call your health care provider.    G-tube is clogged.  · Cause: Thick formula or medication.  · Solutions: Try to slowly push warm water into the tube with a large syringe. Never try to push any object into the tube to unclog it. Do not force fluid into the G-tube. If you are unable to unclog the tube, call your health care provider right away.    Tips  · Head of bed (HOB) position refers to the upright position of a person's upper body.  ? When giving medications or a feeding bolus, keep the HOB up as told by your health care provider. Do this during the feeding and for 1 hour after the feeding or  medication administration.  ? If continuous feedings are being given, it is best to keep the HOB up as told by your health care provider. When ADLs (activities of daily living) are performed and the HOB needs to be flat, be sure to turn the feeding pump off. Restart the feeding pump when the HOB is returned to the recommended height.  · Do not pull or put tension on the tube.  · To prevent fluid backflow, kink the G-tube before removing the cap or disconnecting a syringe.  · Check the G-tube length every day. Measure from the insertion site to the end of the G-tube. If   the length is longer than previous measurements, the tube may be coming out. Call your health care provider if you notice increasing G-tube length.  · Oral care, such as brushing teeth, must be continued.  · You may need to remove excess air (vent) from the G-tube. Your health care provider will tell you if this is needed.  · Always call your health care provider if you have questions or problems with the G-tube.  Get help right away if:  · You have severe abdominal pain, tenderness, or abdominal bloating (distension).  · You have nausea or vomiting.  · You are constipated or have problems moving your bowels.  · The G-tube insertion site is red, swollen, has a foul smell, or has yellow or brown drainage.  · You have difficulty breathing or shortness of breath.  · You have a fever.  · You have a large amount of feeding tube residuals.  · The G-tube is clogged and cannot be flushed.  This information is not intended to replace advice given to you by your health care provider. Make sure you discuss any questions you have with your health care provider.  Document Released: 06/16/2001 Document Revised: 09/13/2015 Document Reviewed: 12/13/2012  [employer] Interactive Patient Education © 2017 [employer]

## 2017-03-04 NOTE — Progress Notes (Signed)
[facility]pt[facility] Progress Note   Kristen Escobar [id number] 1945-07-21 71 y.o.  Kristen Escobar is managed by Dr. Nicholas Lose  Actively treated with chemotherapy: yes  Current Therapy: Rituxan, Adriamycin, Cytoxan, and vincristine  Last Treated: 02/27/2017  Assessment: Plan:    Projectile vomiting with nausea - Plan: metoCLOPramide (REGLAN) 5 MG tablet  Esophagitis - Plan: sucralfate (CARAFATE) 1 g tablet  Diffuse large B-cell lymphoma of lymph nodes of neck (HCC)  Chemotherapy induced nausea and vomiting - Plan: ondansetron (ZOFRAN) 8 MG tablet  Port or reservoir infection, sequela - Plan: heparin lock flush 100 unit/mL, sodium chloride flush (NS) 0.9 % injection 10 mL, 0.9 %  sodium chloride infusion, DISCONTINUED: heparin lock flush 100 unit/mL, DISCONTINUED: sodium chloride flush (NS) 0.9 % injection 10 mL, DISCONTINUED: 0.9 %  sodium chloride infusion  Port-A-Cath in place - Plan: heparin lock flush 100 unit/mL, sodium chloride flush (NS) 0.9 % injection 10 mL, 0.9 %  sodium chloride infusion, DISCONTINUED: heparin lock flush 100 unit/mL, DISCONTINUED: sodium chloride flush (NS) 0.9 % injection 10 mL, DISCONTINUED: 0.9 %  sodium chloride infusion  Malignant neoplasm of upper-outer quadrant of right breast in female, estrogen receptor positive ([place]) - Plan: heparin lock flush 100 unit/mL, sodium chloride flush (NS) 0.9 % injection 10 mL, 0.9 %  sodium chloride infusion, DISCONTINUED: heparin lock flush 100 unit/mL, DISCONTINUED: sodium chloride flush (NS) 0.9 % injection 10 mL, DISCONTINUED: 0.9 %  sodium chloride infusion   Esophagitis: The patient has been given a prescription for Carafate 1 g tablets via tube 4 times daily.  Diffuse B-cell lymphoma: The patient is status post cycle 3 of R CHOP dosed on 02/27/2017.  Chemotherapy-induced nausea and vomiting: Patient was given a prescription for Reglan 5 mg via tube 4 times daily.  She was given Zofran 8 mg IV today  and was given 1 L of IV normal saline.  Please see After Visit Summary for patient specific instructions.  Future Appointments  Date Time Provider Assumption  03/06/2017 10:00 AM [facility] 4 [facility] None  03/06/2017 10:30 AM Federico Maiorino, Lucianne Lei E., PA-C [facility] None  03/06/2017 11:15 AM Karie Mainland, RD [facility] None  03/20/2017  8:30 AM [facility] LAB 5 [facility] None  03/20/2017  8:45 AM [facility] J32 DNS [facility] None  03/20/2017  9:15 AM Nicholas Lose, MD [facility] None  03/20/2017 10:15 AM [facility] C10 [facility] None  03/20/2017 11:15 AM Karie Mainland, RD [facility] None  03/24/2017  8:45 AM Marletta Lor, MD [facility]  08/04/2017  8:30 AM Nicholas Lose, MD [facility] None    No orders of the defined types were placed in this encounter.      Subjective:   Patient ID:  Kristen Escobar is a 71 y.o. (DOB 28-Jun-1945) female.  Chief Complaint:  Chief Complaint  Patient presents with  . Emesis    plus nausea, fever    HPI Kristen Escobar is a 71 year old female with a history of a breast cancer in the upper outer quadrant of her right breast and most recently a high-grade diffuse large B-cell lymphoma diagnosed on a thyroid biopsy dating to September 2018.  She has a history of a recently identified tracheoesophageal fistula during a hospital admission of 02/16/2017.  Most recently she was found to have a malfunctioning PEG tube on 02/26/2008.  This was replaced by interventional radiology.  She was last seen in our office on 02/27/2017 at which time she was treated with Rituxan, Adriamycin, Cytoxan, and vincristine.  Kristen Escobar presents with her husband today.  She had one episode of vomiting on Monday and 2 episodes on Tuesday.  Each of these occurred after her tube feedings and were without nausea prior to her projectile vomiting.  She had diarrhea on Monday.  She took 2 Imodium and has had resolution of her diarrhea.  She has been  off of her antibiotics since Monday and has had no prednisone since Tuesday.  She is having mucopurulent drainage around her PEG tube. She was seen by her PCP yesterday. Her PCP told her that she did not need to resume taking her hydralazine given that her systolic blood pressure had been in the 710G and diastolics have been in the 60s.  According to the patient's husband, Dr. Nicholas Lose would like her to resume Carafate.  The husband would like to clarify if the Carafate tablets are fine given the fact that the Carafate slurry is very expensive.  The patient has an appointment scheduled with nutrition for Friday.  Medications: I have reviewed the patient's current medications.  Allergies: No Known Allergies  Past Medical History:  Diagnosis Date  . Arthritis   . Cancer ([place]) 03/2009   breast- rt  . GERD (gastroesophageal reflux disease)   . History of radiation therapy 07/12/10,completed   right breast 60 Gy x30 fx  . Hypertension   . Hypothyroidism   . Obesity   . Peripheral vascular disease ([place]) 1995   PT DEVELOPED CIRCULATION PROBLEMS IN BOTH HANDS AND GANGRENE OF BOTH INDEX FINGERS--REQUIRING AMPUTATION OF THE INDEX FINGERS AND VASCULAR SURGERY.  PT TOLD HER PROBLEMS RELATED TO SMOKING.   NO OTHER PROBLEMS SINCE.  Marland Kitchen Pneumonia   . Thyroid mass     Past Surgical History:  Procedure Laterality Date  . AMPUTATION     partial amputation of both index fingers  . BREAST SURGERY  2011   lumpectomy with node sampling- RIGHT  . COLONOSCOPY    . ESOPHAGOGASTRODUODENOSCOPY    . EXCISION MASS NECK Left 12/26/2016   Procedure: EXCISION MASS NECK;  Surgeon: Izora Gala, MD;  Location: [facility];  Service: ENT;  Laterality: Left;  open excision of thyroid mass left side with frozen section  . HAND SURGERY Bilateral 1995   Amputaed pointer fingers bilaterally  . IR FLUORO GUIDE PORT INSERTION RIGHT  01/14/2017  . IR GASTROSTOMY TUBE MOD SED  02/19/2017  . IR REPLC GASTRO/COLONIC TUBE PERCUT  W/FLUORO  02/25/2017  . IR US GUIDE VASC ACCESS RIGHT  01/14/2017  . TOTAL HIP ARTHROPLASTY  12/16/2011   right hip  . TOTAL HIP ARTHROPLASTY  01/20/2012   Procedure: TOTAL HIP ARTHROPLASTY ANTERIOR APPROACH;  Surgeon: Mauri Pole, MD;  Location: [facility];  Service: Orthopedics;  Laterality: Left;  . TUBAL LIGATION    . VASCULAR SURGERY     both hands    Family History  Problem Relation Age of Onset  . Cancer Mother        pt unaware of what kind  . Hearing loss Mother   . Coronary artery disease Father   . Diabetes Father   . Coronary artery disease Brother   . Coronary artery disease Brother   . Diabetes Sister   . Cancer Sister        breast  . Colon cancer Neg Hx   . Stomach cancer Neg Hx   . Rectal cancer Neg Hx   . Esophageal cancer Neg Hx     Social History  Socioeconomic History  . Marital status: Married    Spouse name: Not on file  . Number of children: Not on file  . Years of education: Not on file  . Highest education level: Not on file  Social Needs  . Financial resource strain: Not on file  . Food insecurity - worry: Not on file  . Food insecurity - inability: Not on file  . Transportation needs - medical: Not on file  . Transportation needs - non-medical: Not on file  Occupational History  . Not on file  Tobacco Use  . Smoking status: Former Smoker    Packs/day: 1.00    Years: 20.00    Pack years: 20.00    Last attempt to quit: 04/21/1993    Years since quitting: 23.8  . Smokeless tobacco: Never Used  Substance and Sexual Activity  . Alcohol use: Yes    Comment: OCCAS - MAYBE ONCE A MONTH  . Drug use: No  . Sexual activity: Yes  Other Topics Concern  . Not on file  Social History Narrative  . Not on file    Past Medical History, Surgical history, Social history, and Family history were reviewed and updated as appropriate.   Please see review of systems for further details on the patient's review from today.   Review of Systems:  Review  of Systems  Constitutional: Negative for chills, diaphoresis and fever.  HENT: Positive for trouble swallowing.   Respiratory: Positive for cough. Negative for choking, shortness of breath and wheezing.   Gastrointestinal: Positive for diarrhea and vomiting. Negative for abdominal pain, constipation and nausea.  Skin:       mucopurulent drainage around her PEG tube    Objective:   Physical Exam:  BP (!) 133/57 (BP Location: Left Arm, Patient Position: Sitting)   Pulse 82   Temp 99.1 F (37.3 C) (Oral)   Resp (!) 21   Ht 5\' 2"  (1.575 m)   Wt 209 lb 1.6 oz (94.8 kg)   SpO2 97%   BMI 38.24 kg/m  ECOG: 1  Physical Exam  Constitutional: No distress.  HENT:  Head: Normocephalic and atraumatic.  Mouth/Throat:    Cardiovascular: Normal rate, regular rhythm and normal heart sounds. Exam reveals no gallop and no friction rub.  No murmur heard. Pulmonary/Chest: Effort normal and breath sounds normal. No respiratory distress. She has no wheezes. She has no rales.  Abdominal: Soft. Bowel sounds are normal. She exhibits no distension. There is no tenderness. There is no rebound and no guarding.    Neurological: She is alert. Coordination normal.  Skin: Skin is warm and dry. She is not diaphoretic.  Psychiatric: She has a normal mood and affect. Her behavior is normal. Judgment and thought content normal.    Lab Review:     Component Value Date/Time   NA 136 03/05/2017 1102   K 4.0 03/05/2017 1102   CL 101 02/24/2017 1940   CL 105 03/10/2012 0859   CO2 22 03/05/2017 1102   GLUCOSE 142 (H) 03/05/2017 1102   GLUCOSE 149 (H) 03/10/2012 0859   BUN 15.4 03/05/2017 1102   CREATININE 1.0 03/05/2017 1102   CALCIUM 8.3 (L) 03/05/2017 1102   PROT 6.0 (L) 03/05/2017 1102   ALBUMIN 2.4 (L) 03/05/2017 1102   AST 36 (H) 03/05/2017 1102   ALT 309 (HH) 03/05/2017 1102   ALKPHOS 51 03/05/2017 1102   BILITOT 1.36 (H) 03/05/2017 1102   GFRNONAA 51 (L) 02/24/2017 1940   GFRAA 60 (L)  02/24/2017 1940       Component Value Date/Time   WBC < 0.2 (LL) 03/05/2017 1102   WBC 15.4 (H) 02/24/2017 1940   RBC 2.97 (L) 03/05/2017 1102   RBC 3.61 (L) 02/24/2017 1940   HGB 8.3 (L) 03/05/2017 1102   HCT 26.1 (L) 03/05/2017 1102   PLT 64 (L) 03/05/2017 1102   MCV 87.9 03/05/2017 1102   MCH 27.9 03/05/2017 1102   MCH 28.5 02/24/2017 1940   MCHC 31.8 03/05/2017 1102   MCHC 32.4 02/24/2017 1940   RDW 18.4 (H) 03/05/2017 1102   LYMPHSABS 0.7 (L) 02/27/2017 0802   MONOABS 1.4 (H) 02/27/2017 0802   EOSABS 0.1 02/27/2017 0802   EOSABS 0.4 05/02/2009 1517   BASOSABS 0.1 02/27/2017 0802   -------------------------------  Imaging from last 24 hours (if applicable):  Radiology interpretation: Dg Chest 2 View  Result Date: 03/05/2017 CLINICAL DATA:  Productive cough, fever. EXAM: CHEST  2 VIEW COMPARISON:  Radiographs of February 24, 2017. FINDINGS: The heart size and mediastinal contours are within normal limits. Atherosclerosis of thoracic aorta is noted. No pneumothorax or pleural effusion is noted. Right internal jugular Port-A-Cath is unchanged in position. Mild central pulmonary vascular congestion is noted. Mild right basilar opacity is noted concerning for possible atelectasis or infiltrate. The visualized skeletal structures are unremarkable. IMPRESSION: Aortic atherosclerosis. Mild right basilar subsegmental atelectasis or infiltrate is noted. Electronically Signed   By: Marijo Conception, M.D.   On: 03/05/2017 10:34   Dg Chest 2 View  Result Date: 02/24/2017 CLINICAL DATA:  Fever and cough. Patient just released from hospitalization for esophageal fistula. EXAM: CHEST  2 VIEW COMPARISON:  02/16/2017 FINDINGS: Stable position and injectable right-sided port. Stable postsurgical changes in the right chest wall. Cardiomediastinal silhouette is normal. Mediastinal contours appear intact. Calcific atherosclerotic disease of the aorta and tortuosity. There is no evidence of focal  airspace consolidation, pleural effusion or pneumothorax. Low lung volumes. Osseous structures are without acute abnormality. Soft tissues are grossly normal. IMPRESSION: No active cardiopulmonary disease. Electronically Signed   By: Fidela Salisbury M.D.   On: 02/24/2017 19:41   Dg Chest 2 View  Result Date: 02/16/2017 CLINICAL DATA:  Dyspnea on exertion. Hypoxia. Progressive shortness breath for 2 days. Esophageal cancer. EXAM: CHEST  2 VIEW COMPARISON:  Chest film associated with Port-A-Cath insertion 01/14/2017. PET scan 12/08/2016. FINDINGS: Heart size is normal. Lung volumes are low. There is no edema or effusion. No focal airspace disease present. Aortic atherosclerosis is noted. A right IJ Port-A-Cath is stable. Surgical clips are noted in the right breast and axilla. The upper abdomen is unremarkable. IMPRESSION: 1. No acute cardiopulmonary disease. 2. Low lung volumes. Electronically Signed   By: San Morelle M.D.   On: 02/16/2017 13:05   Ir Gastrostomy Tube Mod Sed  Result Date: 02/19/2017 INDICATION: 71 year old with lymphoma and tracheoesophageal fistula. Patient needs a gastrostomy tube in order to avoid the fistula. EXAM: PERCUTANEOUS GASTROSTOMY TUBE WITH FLUOROSCOPIC GUIDANCE Physician: Stephan Minister. Anselm Pancoast, MD MEDICATIONS: Ancef 2 g; Antibiotics were administered within 1 hour of the procedure. Glucagon 0.5 mg IV ANESTHESIA/SEDATION: Versed 2.0 mg IV; Fentanyl 100 mcg IV Moderate Sedation Time:  37 minutes The patient was continuously monitored during the procedure by the interventional radiology nurse under my direct supervision. FLUOROSCOPY TIME:  Fluoroscopy Time: 5 minutes 6 seconds (98 mGy). COMPLICATIONS: None immediate. PROCEDURE: Informed consent was obtained for a percutaneous gastrostomy tube. The patient was placed on the interventional table. Fluoroscopy demonstrated gas in  the transverse colon. An orogastric tube was placed with fluoroscopic guidance without complication.  The anterior abdomen was prepped and draped in sterile fashion. Maximal barrier sterile technique was utilized including caps, mask, sterile gowns, sterile gloves, sterile drape, hand hygiene and skin antiseptic. Stomach was inflated with air through the orogastric tube. The skin and subcutaneous tissues were anesthetized with 1% lidocaine. A total of three SAF-T-[id number] T-fasteners were placed in the stomach using fluoroscopic guidance. Small incision was made between the T-fasteners. Needle was directed into the stomach with fluoroscopic guidance between the T-fasteners. A small amount of contrast was injected to confirm placement in the stomach. Wire was advanced into the stomach. The tract was dilated to accommodate an 18 French peel-away sheath. A 16 French Entuit gastrostomy tube was easily advanced over the wire and a peel-away sheath was removed. The balloon was inflated with 6 mL of saline. Contrast injection confirmed placement in the stomach. The gastrostomy tube was flushed with normal saline. Fluoroscopic images were taken and saved for this procedure. IMPRESSION: Successful fluoroscopic guided percutaneous gastrostomy tube placement. Electronically Signed   By: Markus Daft M.D.   On: 02/19/2017 18:49   Nm Pulmonary Perf And Vent  Addendum Date: 02/16/2017   ADDENDUM REPORT: 02/16/2017 20:22 ADDENDUM: Initial attempts to contact Hospitalists by pager at [place]. Ultimately, Critical Value/emergent results were called by telephone at the time of interpretation on 02/16/2017 at 8:20 pm to Nurse [facility], who verbally acknowledged these results. Electronically Signed   By: Suzy Bouchard M.D.   On: 02/16/2017 20:22   Result Date: 02/16/2017 CLINICAL DATA:  Elevated D-dimer. Esophageal carcinoma. Elevated white count. Short of breath. EXAM: NUCLEAR MEDICINE VENTILATION - PERFUSION LUNG SCAN TECHNIQUE: Ventilation images were obtained in multiple projections using inhaled aerosol Tc-24m DTPA. Perfusion  images were obtained in multiple projections after intravenous injection of Tc-50m MAA. RADIOPHARMACEUTICALS:  28.8 mCi Technetium-48m DTPA aerosol inhalation and 4.1 mCi Technetium-29m MAA IV COMPARISON:  Chest radiograph 02/16/2017 FINDINGS: Ventilation: Informed ventilation per Perfusion: There are subtle wedge-shaped perfusion defects in the LEFT RIGHT upper lobe. Smaller defects at the LEFT and RIGHT lung base posteriorly. These peripheral perfusion defects are unmatched to ventilation. IMPRESSION: Bilateral un matched wedge-shaped peripheral perfusion defects most consistent with acute pulmonary embolism. Electronically Signed: By: Suzy Bouchard M.D. On: 02/16/2017 19:50   Ir Replc Gastro/colonic Tube Percut W/fluoro  Result Date: 02/25/2017 INDICATION: 71 year old female with a history of tracheoesophageal fistula, esophageal carcinoma, and recently placed percutaneous gastrostomy tube. Gastrostomy was placed 02/19/2017, and has been symptomatic, potentially withdrawn. The patient presents for evaluating the tube and possible replacement. EXAM: IMAGE GUIDED REPLACEMENT OF DISPLACED PERCUTANEOUS GASTROSTOMY MEDICATIONS: None ANESTHESIA/SEDATION: Versed 2.0 mg IV; Fentanyl 100 mcg IV Moderate Sedation Time:  20 minutes The patient was continuously monitored during the procedure by the interventional radiology nurse under my direct supervision. CONTRAST:  25 cc - administered into the gastric lumen. FLUOROSCOPY TIME:  Fluoroscopy Time:  minutes  seconds ( mGy). COMPLICATIONS: None PROCEDURE: Informed written consent was obtained from the patient after a thorough discussion of the procedural risks, benefits and alternatives. All questions were addressed. Maximal Sterile Barrier Technique was utilized including caps, mask, sterile gowns, sterile gloves, sterile drape, hand hygiene and skin antiseptic. A timeout was performed prior to the initiation of the procedure. Patient was positioned supine position on  the fluoroscopy table. The indwelling percutaneous gastrostomy tube was injected in AP and lateral, confirming that the tube had been withdrawn from the stomach lumen,  into the abdominal wall. The upper abdomen and the G-tube were then prepped and draped in the usual sterile fashion. Using moderate sedation, the tube was replaced. The balloon remained inflated, and a 5 [place] Kumpe the catheter with contrast was used to navigate through the existing tract into the gastric lumen. Once the catheter was confirmed within the gastric lumen, a Rosen wire was placed. The catheter and the existing gastrostomy to were removed from the tract. Dilation of the soft tissue was performed with 16 [place] dilator. Catheter was advanced over the [facility] wire, and the wire was exchanged for Amplatz wire. 7 French peel-away was then placed over the Amplatz wire into the stomach lumen. With the coaxial wire in place, the peel-away dilator was removed, the Kumpe catheter was inserted through the lumen of the new 97 French balloon retention Intuit gastrostomy tube, and then the coaxial Kumpe the catheter and gastrostomy were inserted through the peel-away sheath into the stomach. Peel-away sheath was removed. Contrast injected through the med port of the gastrostomy tube confirmed location. The wire and coaxial catheter were removed after inflation of the balloon retention. Final image was stored. Patient tolerated the procedure well and remained hemodynamically stable throughout. No complications were encountered and no significant blood loss. IMPRESSION: Status post exchange/rescue of a displaced percutaneous gastrostomy, with placement of a new 59 French balloon retention tube. Signed, Dulcy Fanny. Earleen Newport, DO [facility] Radiology Electronically Signed   By: Corrie Mckusick D.O.   On: 02/25/2017 16:32   Dg Abd 2 Views  Result Date: 02/27/2017 CLINICAL DATA:  Abdomen discomfort by  newly placed G tube - Wednesday , 02/25/17; Hx diffuse large B-cell lymphoma EXAM: ABDOMEN - 2 VIEW COMPARISON:  02/25/2017 and previous FINDINGS: Visualized lung bases clear. No free air. Normal bowel gas pattern. Gastrostomy catheter projects in expected location. Scattered residual oral contrast in the colon. Gastropexy fasteners have advanced to the region of the cecum. No abnormal abdominal calcifications. Bilateral hip arthroplasty hardware incompletely visualized. IMPRESSION: Stable postop changes.  No acute disease. Electronically Signed   By: Lucrezia Europe M.D.   On: 02/27/2017 11:07   Dg Swallowing Func-speech Pathology  Result Date: 02/17/2017 CLINICAL DATA:  71 year old female diagnosed with Lymphoma including a mass at the left tracheoesophageal groove near the thyroid. Multiple imaging guided biopsies were performed, but ultimately a surgical biopsy of the mass was necessary on 12/26/2016 for diagnosis. The patient is now status post chemotherapy, with an unexplained cough, coughing with p.o. intake. EXAM: MODIFIED BARIUM SWALLOW TECHNIQUE: Different consistencies of barium were administered orally to the patient by the Speech Pathologist. Imaging of the pharynx was performed in the lateral projection. FLUOROSCOPY TIME:  Fluoroscopy Time:  0 minutes 54 seconds Radiation Exposure Index (if provided by the fluoroscopic device): 1.8mG y Number of Acquired Spot Images: 0 COMPARISON:  Neck CT 11/21/2016. FINDINGS: Nectar thick liquid- the patient is able to swallow liquid into the cervical esophagus, but with each swallow there is barium extension from the cervical esophagus (C5/C6 level) through the posterior wall of the trachea into the tracheal lumen. This occurs about 18 mm below the level of the glottis (series 5, image 4). 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 PO barium liquid freely passed through the  fistula and into the airway with each swallow. Study discussed by telephone with Dr. Hosie Poisson on 02/17/2017 at 1245 hours. Please  refer to the Speech Pathologists report for complete details and recommendations. Electronically Signed   By: Genevie Ann M.D.   On: 02/17/2017 13:28        This case was discussed with Dr. Lindi Adie. He expresses agreement with my management of this patient.

## 2017-03-05 ENCOUNTER — Other Ambulatory Visit: Payer: Self-pay

## 2017-03-05 ENCOUNTER — Ambulatory Visit (HOSPITAL_BASED_OUTPATIENT_CLINIC_OR_DEPARTMENT_OTHER): Payer: Medicare Other

## 2017-03-05 ENCOUNTER — Ambulatory Visit (HOSPITAL_COMMUNITY)
Admission: RE | Admit: 2017-03-05 | Discharge: 2017-03-05 | Disposition: A | Discharge status: Home / Self Care | Payer: Medicare Other | Source: Ambulatory Visit | Attending: Hematology and Oncology | Admitting: Hematology and Oncology

## 2017-03-05 ENCOUNTER — Other Ambulatory Visit: Payer: Self-pay | Admitting: *Deleted

## 2017-03-05 ENCOUNTER — Encounter (HOSPITAL_COMMUNITY): Payer: Self-pay | Admitting: Internal Medicine

## 2017-03-05 ENCOUNTER — Ambulatory Visit (HOSPITAL_BASED_OUTPATIENT_CLINIC_OR_DEPARTMENT_OTHER): Payer: Medicare Other | Admitting: Medical

## 2017-03-05 ENCOUNTER — Inpatient Hospital Stay (HOSPITAL_COMMUNITY)
Admission: AD | Admit: 2017-03-05 | Discharge: 2017-03-09 | DRG: 808 | Disposition: A | Discharge status: Home / Self Care | Payer: Medicare Other | Source: Ambulatory Visit | Attending: Internal Medicine | Admitting: Internal Medicine

## 2017-03-05 VITALS — BP 157/73 | HR 100 | Temp 99.4°F | Resp 18

## 2017-03-05 DIAGNOSIS — Z7901 Long term (current) use of anticoagulants: Secondary | ICD-10-CM

## 2017-03-05 DIAGNOSIS — K219 Gastro-esophageal reflux disease without esophagitis: Secondary | ICD-10-CM | POA: Diagnosis present

## 2017-03-05 DIAGNOSIS — Z170 Estrogen receptor positive status [ER+]: Secondary | ICD-10-CM

## 2017-03-05 DIAGNOSIS — C8330 Diffuse large B-cell lymphoma, unspecified site: Secondary | ICD-10-CM | POA: Diagnosis present

## 2017-03-05 DIAGNOSIS — D709 Neutropenia, unspecified: Secondary | ICD-10-CM

## 2017-03-05 DIAGNOSIS — Z6838 Body mass index (BMI) 38.0-38.9, adult: Secondary | ICD-10-CM | POA: Diagnosis not present

## 2017-03-05 DIAGNOSIS — D61818 Other pancytopenia: Secondary | ICD-10-CM | POA: Diagnosis present

## 2017-03-05 DIAGNOSIS — M1990 Unspecified osteoarthritis, unspecified site: Secondary | ICD-10-CM | POA: Diagnosis present

## 2017-03-05 DIAGNOSIS — Z95828 Presence of other vascular implants and grafts: Secondary | ICD-10-CM

## 2017-03-05 DIAGNOSIS — R7989 Other specified abnormal findings of blood chemistry: Secondary | ICD-10-CM | POA: Diagnosis present

## 2017-03-05 DIAGNOSIS — E039 Hypothyroidism, unspecified: Secondary | ICD-10-CM | POA: Diagnosis not present

## 2017-03-05 DIAGNOSIS — K9423 Gastrostomy malfunction: Secondary | ICD-10-CM | POA: Diagnosis not present

## 2017-03-05 DIAGNOSIS — I739 Peripheral vascular disease, unspecified: Secondary | ICD-10-CM | POA: Diagnosis present

## 2017-03-05 DIAGNOSIS — Z931 Gastrostomy status: Secondary | ICD-10-CM | POA: Diagnosis not present

## 2017-03-05 DIAGNOSIS — T80212S Local infection due to central venous catheter, sequela: Secondary | ICD-10-CM

## 2017-03-05 DIAGNOSIS — E669 Obesity, unspecified: Secondary | ICD-10-CM | POA: Diagnosis present

## 2017-03-05 DIAGNOSIS — R509 Fever, unspecified: Secondary | ICD-10-CM | POA: Diagnosis not present

## 2017-03-05 DIAGNOSIS — R5081 Fever presenting with conditions classified elsewhere: Secondary | ICD-10-CM | POA: Diagnosis present

## 2017-03-05 DIAGNOSIS — Z89022 Acquired absence of left finger(s): Secondary | ICD-10-CM

## 2017-03-05 DIAGNOSIS — R740 Nonspecific elevation of levels of transaminase and lactic acid dehydrogenase [LDH]: Secondary | ICD-10-CM | POA: Diagnosis present

## 2017-03-05 DIAGNOSIS — T451X5A Adverse effect of antineoplastic and immunosuppressive drugs, initial encounter: Secondary | ICD-10-CM | POA: Diagnosis present

## 2017-03-05 DIAGNOSIS — I10 Essential (primary) hypertension: Secondary | ICD-10-CM | POA: Diagnosis present

## 2017-03-05 DIAGNOSIS — R197 Diarrhea, unspecified: Secondary | ICD-10-CM | POA: Diagnosis present

## 2017-03-05 DIAGNOSIS — C50411 Malignant neoplasm of upper-outer quadrant of right female breast: Secondary | ICD-10-CM | POA: Diagnosis not present

## 2017-03-05 DIAGNOSIS — J9811 Atelectasis: Secondary | ICD-10-CM | POA: Diagnosis present

## 2017-03-05 DIAGNOSIS — C8331 Diffuse large B-cell lymphoma, lymph nodes of head, face, and neck: Secondary | ICD-10-CM | POA: Diagnosis not present

## 2017-03-05 DIAGNOSIS — Z923 Personal history of irradiation: Secondary | ICD-10-CM | POA: Diagnosis not present

## 2017-03-05 DIAGNOSIS — J860 Pyothorax with fistula: Secondary | ICD-10-CM | POA: Diagnosis present

## 2017-03-05 DIAGNOSIS — D708 Other neutropenia: Secondary | ICD-10-CM | POA: Diagnosis not present

## 2017-03-05 DIAGNOSIS — Z86711 Personal history of pulmonary embolism: Secondary | ICD-10-CM | POA: Diagnosis not present

## 2017-03-05 DIAGNOSIS — R159 Full incontinence of feces: Secondary | ICD-10-CM | POA: Diagnosis not present

## 2017-03-05 DIAGNOSIS — Z87891 Personal history of nicotine dependence: Secondary | ICD-10-CM

## 2017-03-05 DIAGNOSIS — I2699 Other pulmonary embolism without acute cor pulmonale: Secondary | ICD-10-CM | POA: Diagnosis present

## 2017-03-05 DIAGNOSIS — Z96643 Presence of artificial hip joint, bilateral: Secondary | ICD-10-CM | POA: Diagnosis present

## 2017-03-05 DIAGNOSIS — K8020 Calculus of gallbladder without cholecystitis without obstruction: Secondary | ICD-10-CM | POA: Diagnosis not present

## 2017-03-05 DIAGNOSIS — R05 Cough: Secondary | ICD-10-CM | POA: Diagnosis not present

## 2017-03-05 DIAGNOSIS — Z803 Family history of malignant neoplasm of breast: Secondary | ICD-10-CM

## 2017-03-05 DIAGNOSIS — I2782 Chronic pulmonary embolism: Secondary | ICD-10-CM | POA: Diagnosis not present

## 2017-03-05 DIAGNOSIS — R112 Nausea with vomiting, unspecified: Secondary | ICD-10-CM

## 2017-03-05 DIAGNOSIS — Z89021 Acquired absence of right finger(s): Secondary | ICD-10-CM

## 2017-03-05 DIAGNOSIS — Z809 Family history of malignant neoplasm, unspecified: Secondary | ICD-10-CM

## 2017-03-05 DIAGNOSIS — Z79890 Hormone replacement therapy (postmenopausal): Secondary | ICD-10-CM

## 2017-03-05 LAB — COMPREHENSIVE METABOLIC PANEL
ALT: 309 U/L — AB (ref 0–55)
AST: 36 U/L — AB (ref 5–34)
Albumin: 2.4 g/dL — ABNORMAL LOW (ref 3.5–5.0)
Alkaline Phosphatase: 51 U/L (ref 40–150)
Anion Gap: 7 mEq/L (ref 3–11)
BUN: 15.4 mg/dL (ref 7.0–26.0)
CHLORIDE: 106 meq/L (ref 98–109)
CO2: 22 meq/L (ref 22–29)
CREATININE: 1 mg/dL (ref 0.6–1.1)
Calcium: 8.3 mg/dL — ABNORMAL LOW (ref 8.4–10.4)
EGFR: 59 mL/min/{1.73_m2} — ABNORMAL LOW (ref >60)
GLUCOSE: 142 mg/dL — AB (ref 70–140)
POTASSIUM: 4 meq/L (ref 3.5–5.1)
SODIUM: 136 meq/L (ref 136–145)
Total Bilirubin: 1.36 mg/dL — ABNORMAL HIGH (ref 0.20–1.20)
Total Protein: 6 g/dL — ABNORMAL LOW (ref 6.4–8.3)

## 2017-03-05 LAB — CBC WITH DIFFERENTIAL/PLATELET
HCT: 26.1 % — ABNORMAL LOW (ref 34.8–46.6)
HGB: 8.3 g/dL — ABNORMAL LOW (ref 11.6–15.9)
MCH: 27.9 pg (ref 25.1–34.0)
MCHC: 31.8 g/dL (ref 31.5–36.0)
MCV: 87.9 fL (ref 79.5–101.0)
PLATELETS: 64 10*3/uL — AB (ref 145–400)
RBC: 2.97 10*6/uL — AB (ref 3.70–5.45)
RDW: 18.4 % — ABNORMAL HIGH (ref 11.2–14.5)
WBC: <0.2 10*3/uL — CL (ref 3.9–10.3)

## 2017-03-05 LAB — GLUCOSE, CAPILLARY: Glucose-Capillary: 128 mg/dL — ABNORMAL HIGH (ref 65–99)

## 2017-03-05 LAB — TECHNOLOGIST REVIEW

## 2017-03-05 MED ORDER — LEVOTHYROXINE SODIUM 50 MCG PO TABS
50.0000 ug | ORAL_TABLET | Freq: Every day | ORAL | Status: DC
  Administered 2017-03-06 – 2017-03-09 (×4): 50 ug via ORAL
  Filled 2017-03-05 (×4): qty 1

## 2017-03-05 MED ORDER — ONDANSETRON HCL 4 MG PO TABS: 4.0000 mg | ORAL_TABLET | Freq: Four times a day (QID) | ORAL | Status: DC | PRN

## 2017-03-05 MED ORDER — SODIUM CHLORIDE 0.9% FLUSH
10.0000 mL | Freq: Once | INTRAVENOUS | Status: DC
  Filled 2017-03-05: qty 10

## 2017-03-05 MED ORDER — ACETAMINOPHEN 325 MG PO TABS
650.0000 mg | ORAL_TABLET | Freq: Four times a day (QID) | ORAL | Status: DC | PRN
  Administered 2017-03-06 – 2017-03-08 (×3): 650 mg via ORAL
  Filled 2017-03-05 (×3): qty 2

## 2017-03-05 MED ORDER — ONDANSETRON HCL 4 MG/2ML IJ SOLN: 4.0000 mg | Freq: Four times a day (QID) | INTRAMUSCULAR | Status: DC | PRN

## 2017-03-05 MED ORDER — PIPERACILLIN-TAZOBACTAM 3.375 G IVPB
3.3750 g | Freq: Three times a day (TID) | INTRAVENOUS | Status: DC
  Administered 2017-03-05: 3.375 g via INTRAVENOUS
  Filled 2017-03-05 (×3): qty 50

## 2017-03-05 MED ORDER — HEPARIN SOD (PORK) LOCK FLUSH 100 UNIT/ML IV SOLN
500.0000 [IU] | Freq: Once | INTRAVENOUS | Status: DC
  Filled 2017-03-05: qty 5

## 2017-03-05 MED ORDER — LORAZEPAM 0.5 MG PO TABS: 0.5000 mg | ORAL_TABLET | Freq: Four times a day (QID) | ORAL | Status: DC | PRN

## 2017-03-05 MED ORDER — LEVOFLOXACIN IN D5W 500 MG/100ML IV SOLN: 500.0000 mg | INTRAVENOUS | Status: DC

## 2017-03-05 MED ORDER — PIPERACILLIN-TAZOBACTAM 3.375 G IVPB 30 MIN: 3.3750 g | Freq: Once | INTRAVENOUS | Status: DC

## 2017-03-05 MED ORDER — SODIUM CHLORIDE 0.9% FLUSH
3.0000 mL | Freq: Two times a day (BID) | INTRAVENOUS | Status: DC
  Administered 2017-03-07 – 2017-03-08 (×4): 3 mL via INTRAVENOUS

## 2017-03-05 MED ORDER — PANTOPRAZOLE SODIUM 40 MG PO TBEC
40.0000 mg | DELAYED_RELEASE_TABLET | Freq: Every day | ORAL | Status: DC
  Administered 2017-03-06 – 2017-03-08 (×3): 40 mg via ORAL
  Filled 2017-03-05 (×3): qty 1

## 2017-03-05 MED ORDER — VANCOMYCIN HCL 10 G IV SOLR
2000.0000 mg | Freq: Once | INTRAVENOUS | Status: AC
  Administered 2017-03-05: 2000 mg via INTRAVENOUS
  Filled 2017-03-05: qty 2000

## 2017-03-05 MED ORDER — RIVAROXABAN 20 MG PO TABS: 20.0000 mg | ORAL_TABLET | Freq: Every day | ORAL | Status: DC

## 2017-03-05 MED ORDER — ACETAMINOPHEN 650 MG RE SUPP: 650.0000 mg | Freq: Four times a day (QID) | RECTAL | Status: DC | PRN

## 2017-03-05 MED ORDER — JEVITY 1.2 CAL PO LIQD: 1000.0000 mL | ORAL | Status: DC

## 2017-03-05 MED ORDER — LEVOFLOXACIN IN D5W 750 MG/150ML IV SOLN
750.0000 mg | Freq: Once | INTRAVENOUS | Status: AC
  Administered 2017-03-05: 750 mg via INTRAVENOUS
  Filled 2017-03-05: qty 150

## 2017-03-05 MED ORDER — VANCOMYCIN HCL IN DEXTROSE 750-5 MG/150ML-% IV SOLN: 750.0000 mg | INTRAVENOUS | Status: DC

## 2017-03-05 MED ORDER — SODIUM CHLORIDE 0.9% FLUSH: 3.0000 mL | INTRAVENOUS | Status: DC | PRN

## 2017-03-05 MED ORDER — SODIUM CHLORIDE 0.9 % IV SOLN: 250.0000 mL | INTRAVENOUS | Status: DC | PRN

## 2017-03-05 MED ORDER — SODIUM CHLORIDE 0.9 % IV SOLN
Freq: Once | INTRAVENOUS | Status: AC
  Administered 2017-03-05: 12:00:00 via INTRAVENOUS

## 2017-03-05 MED ORDER — SACCHAROMYCES BOULARDII 250 MG PO CAPS
250.0000 mg | ORAL_CAPSULE | Freq: Two times a day (BID) | ORAL | Status: DC
  Administered 2017-03-05 – 2017-03-08 (×7): 250 mg via ORAL
  Filled 2017-03-05 (×7): qty 1

## 2017-03-05 MED ORDER — DOXYCYCLINE HYCLATE 100 MG PO TABS: 100.0000 mg | ORAL_TABLET | Freq: Two times a day (BID) | ORAL | 0 refills | Status: DC

## 2017-03-05 MED ORDER — ALLOPURINOL 300 MG PO TABS
300.0000 mg | ORAL_TABLET | Freq: Every day | ORAL | Status: DC
  Administered 2017-03-06 – 2017-03-08 (×3): 300 mg via ORAL
  Filled 2017-03-05 (×3): qty 1

## 2017-03-05 MED ORDER — RIVAROXABAN 15 MG PO TABS
15.0000 mg | ORAL_TABLET | Freq: Two times a day (BID) | ORAL | Status: DC
  Administered 2017-03-05: 15 mg via ORAL
  Filled 2017-03-05 (×2): qty 1

## 2017-03-05 NOTE — Progress Notes (Signed)
Pt husband called this morning to report that Kristen Escobar has had a 101.3 fever temp this morning. She is coughing up productive sputum and still has some mild purulent discharge from peg tube. Pt was instructed by MD to avoid taking OTC ibuprofen or tylenol due to recent chemo and increased LFT's.   Notified Dr.Gudena and would like to have pt come in today for chest xray, labs, and to see Van,Pa (SM). Pt agreeable to plan and will be on their way now.  Message sent to scheduling for labs/SM at 1030am. Spoke with Beth,RN of stat labs,UA, and blood cultures. Pt will be going to lab first to have peripheral blood culture drawn and the rest will be collected via port in symptom management. Notified Kim in lab and is aware.   Told husband to bring the pt in to [facility] radiology first to obtain cxr prior to coming in the cancer center. Pt husband verbalized understanding and will be on their way.

## 2017-03-05 NOTE — Progress Notes (Signed)
[facility] Progress Note   Kristen Escobar [id number] [birth year] 71 y.o.  Kristen Escobar is managed by Dr. Lindi Adie  Actively treated with chemotherapy: yes  Current Therapy: R CHOP  Last Treated: 02/27/2017  Assessment: Plan:    Port or reservoir infection, sequela - Plan: heparin lock flush 100 unit/mL, sodium chloride flush (NS) 0.9 % injection 10 mL, 0.9 %  sodium chloride infusion, levofloxacin (LEVAQUIN) IVPB 750 mg  Port-A-Cath in place - Plan: heparin lock flush 100 unit/mL, sodium chloride flush (NS) 0.9 % injection 10 mL, 0.9 %  sodium chloride infusion  Febrile neutropenia (HCC) - Plan: doxycycline (VIBRA-TABS) 100 MG tablet  Non-intractable vomiting with nausea, unspecified vomiting type   Febrile neutropenia: The patient was given Levaquin 750 mg IV x1.  The patient was admitted to the care of Dr. Bonnielee Haff  Non-intractable vomiting: [name] vomiting has resolved with her use of Reglan.  Please see After Visit Summary for patient specific instructions.  Future Appointments  Date Time Provider [place]  03/20/2017  8:30 AM [facility] LAB 5 [facility] None  03/20/2017  8:45 AM [facility] J32 DNS [facility] None  03/20/2017  9:15 AM Nicholas Lose, MD [facility] None  03/20/2017 10:15 AM [facility] C10 [facility] None  03/20/2017 11:15 AM Karie Mainland, RD [facility] None  03/24/2017  8:45 AM Marletta Lor, MD [facility]  08/04/2017  8:30 AM Nicholas Lose, MD [facility] None    No orders of the defined types were placed in this encounter.      Subjective:   Patient ID:  Kristen Escobar is a 71 y.o. (DOB May 26, 1945) female.  Chief Complaint:  Chief Complaint  Patient presents with  . 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  She has had her PEG tube replaced on 02/25/2017 as it was malfunctioning.  She was seen in our office yesterday for diarrhea and vomiting without nausea after her tube feeds.  She is also having some mucopurulent drainage around her PEG tube.  She was not having fevers chills or sweats at that time.  She was prescribed Reglan which was started yesterday.  According to her husband she was able to have 3 tube feedings with out any vomiting after beginning Reglan.  She presents to the office this morning after having one fever of 101.3.  She continues to have a cough with occasional darker sputum.  This is unchanged.  She is having no shortness of breath.  She denies chills, diaphoresis, and has noticed little to no mucopurulent discharge around her PEG tube since her visit yesterday.  She has had no additional episodes of vomiting.  She is not having nausea.  A chest x-ray completed today showed a mild right basilar subsegmental atelectasis or infiltrate.  Patient's CBC returned today at less than 0.2.  Medications: I have reviewed the patient's current medications.  Allergies: No Known Allergies  Past Medical History:  Diagnosis Date  . Arthritis   . Cancer ([place]) 03/2009   breast- rt  . GERD (gastroesophageal reflux disease)   . History of radiation therapy 07/12/10,completed   right breast 60 Gy x30 fx  . Hypertension   . Hypothyroidism   .  Obesity   . Peripheral vascular disease ([place]) 1995   PT DEVELOPED CIRCULATION PROBLEMS IN BOTH HANDS AND GANGRENE OF BOTH INDEX FINGERS--REQUIRING AMPUTATION OF THE INDEX FINGERS AND VASCULAR SURGERY.  PT TOLD HER PROBLEMS RELATED TO SMOKING.   NO OTHER PROBLEMS SINCE.  Marland Kitchen Pneumonia   . Thyroid mass     Past Surgical History:  Procedure Laterality Date  . AMPUTATION     partial amputation of both index fingers  . BREAST SURGERY   2011   lumpectomy with node sampling- RIGHT  . COLONOSCOPY    . ESOPHAGOGASTRODUODENOSCOPY    . EXCISION MASS NECK Left 12/26/2016   Performed by Izora Gala, MD at [place]  . HAND SURGERY Bilateral 1995   Amputaed pointer fingers bilaterally  . IR FLUORO GUIDE PORT INSERTION RIGHT  01/14/2017  . IR GASTROSTOMY TUBE MOD SED  02/19/2017  . IR REPLC GASTRO/COLONIC TUBE PERCUT W/FLUORO  02/25/2017  . IR US GUIDE VASC ACCESS RIGHT  01/14/2017  . TOTAL HIP ARTHROPLASTY  12/16/2011   right hip  . TOTAL HIP ARTHROPLASTY ANTERIOR APPROACH Left 01/20/2012   Performed by Mauri Pole, MD at [facility] ORS  . TOTAL HIP ARTHROPLASTY ANTERIOR APPROACH Right 12/16/2011   Performed by Mauri Pole, MD at [facility] ORS  . TUBAL LIGATION    . VASCULAR SURGERY     both hands    Family History  Problem Relation Age of Onset  . Cancer Mother        pt unaware of what kind  . Hearing loss Mother   . Coronary artery disease Father   . Diabetes Father   . Coronary artery disease Brother   . Coronary artery disease Brother   . Diabetes Sister   . Cancer Sister        breast  . Colon cancer Neg Hx   . Stomach cancer Neg Hx   . Rectal cancer Neg Hx   . Esophageal cancer Neg Hx     Social History   Socioeconomic History  . Marital status: Married    Spouse name: Not on file  . Number of children: Not on file  . Years of education: Not on file  . Highest education level: Not on file  Social Needs  . Financial resource strain: Not on file  . Food insecurity - worry: Not on file  . Food insecurity - inability: Not on file  . Transportation needs - medical: Not on file  . Transportation needs - non-medical: Not on file  Occupational History  . Not on file  Tobacco Use  . Smoking status: Former Smoker    Packs/day: 1.00    Years: 20.00    Pack years: 20.00    Last attempt to quit: 04/21/1993    Years since quitting: 23.8  . Smokeless tobacco: Never Used  Substance and Sexual Activity  . Alcohol use:  Yes    Comment: OCCAS - MAYBE ONCE A MONTH  . Drug use: No  . Sexual activity: Yes  Other Topics Concern  . Not on file  Social History Narrative  . Not on file    Past Medical History, Surgical history, Social history, and Family history were reviewed and updated as appropriate.   Please see review of systems for further details on the patient's review from today.   Review of Systems:  Review of Systems  Constitutional: Positive for fever. Negative for chills and diaphoresis.  HENT: Negative for congestion, postnasal drip, sinus pressure  and sinus pain.   Respiratory: Positive for cough. Negative for choking and shortness of breath.   Gastrointestinal: Positive for diarrhea (Loose stools with tube feedings). Negative for abdominal pain, constipation, nausea and vomiting.    Objective:   Physical Exam:  BP (!) 157/73 (BP Location: Right Arm, Patient Position: Sitting)   Pulse 100   Temp 99.4 F (37.4 C) (Oral)   Resp 18   SpO2 95%  ECOG: 1  Physical Exam  Constitutional: No distress.  HENT:  Head: Normocephalic and atraumatic.  Mouth/Throat:    Cardiovascular: S1 normal and S2 normal. Tachycardia present.  Pulmonary/Chest: Effort normal and breath sounds normal. No respiratory distress. She has no wheezes. She has no rales.  Abdominal: Soft. Bowel sounds are normal. She exhibits no distension. There is no tenderness. There is no rebound and no guarding.    Neurological: She is alert.  Skin: Skin is warm and dry. She is not diaphoretic.    Lab Review:     Component Value Date/Time   NA 136 03/06/2017 0411   NA 136 03/05/2017 1102   K 3.8 03/06/2017 0411   K 4.0 03/05/2017 1102   CL 110 03/06/2017 0411   CL 105 03/10/2012 0859   CO2 22 03/06/2017 0411   CO2 22 03/05/2017 1102   GLUCOSE 212 (H) 03/06/2017 0411   GLUCOSE 142 (H) 03/05/2017 1102   GLUCOSE 149 (H) 03/10/2012 0859   BUN 13 03/06/2017 0411   BUN 15.4 03/05/2017 1102   CREATININE 0.97  03/06/2017 0411   CREATININE 1.0 03/05/2017 1102   CALCIUM 7.4 (L) 03/06/2017 0411   CALCIUM 8.3 (L) 03/05/2017 1102   PROT 6.0 (L) 03/05/2017 1102   ALBUMIN 2.4 (L) 03/05/2017 1102   AST 36 (H) 03/05/2017 1102   ALT 309 (HH) 03/05/2017 1102   ALKPHOS 51 03/05/2017 1102   BILITOT 1.36 (H) 03/05/2017 1102   GFRNONAA 58 (L) 03/06/2017 0411   GFRAA >60 03/06/2017 0411       Component Value Date/Time   WBC 0.1 (LL) 03/06/2017 0411   RBC 2.42 (L) 03/06/2017 0411   HGB 7.1 (L) 03/06/2017 0411   HGB 8.3 (L) 03/05/2017 1102   HCT 21.0 (L) 03/06/2017 0411   HCT 26.1 (L) 03/05/2017 1102   PLT 32 (L) 03/06/2017 0411   PLT 64 (L) 03/05/2017 1102   MCV 86.8 03/06/2017 0411   MCV 87.9 03/05/2017 1102   MCH 29.3 03/06/2017 0411   MCHC 33.8 03/06/2017 0411   RDW 18.5 (H) 03/06/2017 0411   RDW 18.4 (H) 03/05/2017 1102   LYMPHSABS 0.7 (L) 02/27/2017 0802   MONOABS 1.4 (H) 02/27/2017 0802   EOSABS 0.1 02/27/2017 0802   EOSABS 0.4 05/02/2009 1517   BASOSABS 0.1 02/27/2017 0802   -------------------------------  Imaging from last 24 hours (if applicable):  Radiology interpretation: Dg Chest 2 View  Result Date: 03/05/2017 CLINICAL DATA:  Productive cough, fever. EXAM: CHEST  2 VIEW COMPARISON:  Radiographs of February 24, 2017. FINDINGS: The heart size and mediastinal contours are within normal limits. Atherosclerosis of thoracic aorta is noted. No pneumothorax or pleural effusion is noted. Right internal jugular Port-A-Cath is unchanged in position. Mild central pulmonary vascular congestion is noted. Mild right basilar opacity is noted concerning for possible atelectasis or infiltrate. The visualized skeletal structures are unremarkable. IMPRESSION: Aortic atherosclerosis. Mild right basilar subsegmental atelectasis or infiltrate is noted. Electronically Signed   By: Marijo Conception, M.D.   On: 03/05/2017 10:34   Dg Chest  2 View  Result Date: 02/24/2017 CLINICAL DATA:  Fever and cough.  Patient just released from hospitalization for esophageal fistula. EXAM: CHEST  2 VIEW COMPARISON:  02/16/2017 FINDINGS: Stable position and injectable right-sided port. Stable postsurgical changes in the right chest wall. Cardiomediastinal silhouette is normal. Mediastinal contours appear intact. Calcific atherosclerotic disease of the aorta and tortuosity. There is no evidence of focal airspace consolidation, pleural effusion or pneumothorax. Low lung volumes. Osseous structures are without acute abnormality. Soft tissues are grossly normal. IMPRESSION: No active cardiopulmonary disease. Electronically Signed   By: Fidela Salisbury M.D.   On: 02/24/2017 19:41   Dg Chest 2 View  Result Date: 02/16/2017 CLINICAL DATA:  Dyspnea on exertion. Hypoxia. Progressive shortness breath for 2 days. Esophageal cancer. EXAM: CHEST  2 VIEW COMPARISON:  Chest film associated with Port-A-Cath insertion 01/14/2017. PET scan 12/08/2016. FINDINGS: Heart size is normal. Lung volumes are low. There is no edema or effusion. No focal airspace disease present. Aortic atherosclerosis is noted. A right IJ Port-A-Cath is stable. Surgical clips are noted in the right breast and axilla. The upper abdomen is unremarkable. IMPRESSION: 1. No acute cardiopulmonary disease. 2. Low lung volumes. Electronically Signed   By: San Morelle M.D.   On: 02/16/2017 13:05   Ir Gastrostomy Tube Mod Sed  Result Date: 02/19/2017 INDICATION: 71 year old with lymphoma and tracheoesophageal fistula. Patient needs a gastrostomy tube in order to avoid the fistula. EXAM: PERCUTANEOUS GASTROSTOMY TUBE WITH FLUOROSCOPIC GUIDANCE Physician: Stephan Minister. Anselm Pancoast, MD MEDICATIONS: Ancef 2 g; Antibiotics were administered within 1 hour of the procedure. Glucagon 0.5 mg IV ANESTHESIA/SEDATION: Versed 2.0 mg IV; Fentanyl 100 mcg IV Moderate Sedation Time:  37 minutes The patient was continuously monitored during the procedure by the interventional radiology nurse  under my direct supervision. FLUOROSCOPY TIME:  Fluoroscopy Time: 5 minutes 6 seconds (98 mGy). COMPLICATIONS: None immediate. PROCEDURE: Informed consent was obtained for a percutaneous gastrostomy tube. The patient was placed on the interventional table. Fluoroscopy demonstrated gas in the transverse colon. An orogastric tube was placed with fluoroscopic guidance without complication. The anterior abdomen was prepped and draped in sterile fashion. Maximal barrier sterile technique was utilized including caps, mask, sterile gowns, sterile gloves, sterile drape, hand hygiene and skin antiseptic. Stomach was inflated with air through the orogastric tube. The skin and subcutaneous tissues were anesthetized with 1% lidocaine. A total of three [id number]-[id number] T-fasteners were placed in the stomach using fluoroscopic guidance. Small incision was made between the T-fasteners. Needle was directed into the stomach with fluoroscopic guidance between the T-fasteners. A small amount of contrast was injected to confirm placement in the stomach. Wire was advanced into the stomach. The tract was dilated to accommodate an 18 French peel-away sheath. A 16 French Entuit gastrostomy tube was easily advanced over the wire and a peel-away sheath was removed. The balloon was inflated with 6 mL of saline. Contrast injection confirmed placement in the stomach. The gastrostomy tube was flushed with normal saline. Fluoroscopic images were taken and saved for this procedure. IMPRESSION: Successful fluoroscopic guided percutaneous gastrostomy tube placement. Electronically Signed   By: Markus Daft M.D.   On: 02/19/2017 18:49   Nm Pulmonary Perf And Vent  Addendum Date: 02/16/2017   ADDENDUM REPORT: 02/16/2017 20:22 ADDENDUM: Initial attempts to contact Hospitalists by pager at [place]. Ultimately, Critical Value/emergent results were called by telephone at the time of interpretation on 02/16/2017 at 8:20 pm to Nurse [facility], who verbally  acknowledged these results. Electronically Signed  By: Suzy Bouchard M.D.   On: 02/16/2017 20:22   Result Date: 02/16/2017 CLINICAL DATA:  Elevated D-dimer. Esophageal carcinoma. Elevated white count. Short of breath. EXAM: NUCLEAR MEDICINE VENTILATION - PERFUSION LUNG SCAN TECHNIQUE: Ventilation images were obtained in multiple projections using inhaled aerosol Tc-5m DTPA. Perfusion images were obtained in multiple projections after intravenous injection of Tc-74m MAA. RADIOPHARMACEUTICALS:  28.8 mCi Technetium-24m DTPA aerosol inhalation and 4.1 mCi Technetium-12m MAA IV COMPARISON:  Chest radiograph 02/16/2017 FINDINGS: Ventilation: Informed ventilation per Perfusion: There are subtle wedge-shaped perfusion defects in the LEFT RIGHT upper lobe. Smaller defects at the LEFT and RIGHT lung base posteriorly. These peripheral perfusion defects are unmatched to ventilation. IMPRESSION: Bilateral un matched wedge-shaped peripheral perfusion defects most consistent with acute pulmonary embolism. Electronically Signed: By: Suzy Bouchard M.D. On: 02/16/2017 19:50   Ir Replc Gastro/colonic Tube Percut W/fluoro  Result Date: 02/25/2017 INDICATION: 71 year old female with a history of tracheoesophageal fistula, esophageal carcinoma, and recently placed percutaneous gastrostomy tube. Gastrostomy was placed 02/19/2017, and has been symptomatic, potentially withdrawn. The patient presents for evaluating the tube and possible replacement. EXAM: IMAGE GUIDED REPLACEMENT OF DISPLACED PERCUTANEOUS GASTROSTOMY MEDICATIONS: None ANESTHESIA/SEDATION: Versed 2.0 mg IV; Fentanyl 100 mcg IV Moderate Sedation Time:  20 minutes The patient was continuously monitored during the procedure by the interventional radiology nurse under my direct supervision. CONTRAST:  25 cc - administered into the gastric lumen. FLUOROSCOPY TIME:  Fluoroscopy Time:  minutes  seconds ( mGy). COMPLICATIONS: None PROCEDURE: Informed written consent  was obtained from the patient after a thorough discussion of the procedural risks, benefits and alternatives. All questions were addressed. Maximal Sterile Barrier Technique was utilized including caps, mask, sterile gowns, sterile gloves, sterile drape, hand hygiene and skin antiseptic. A timeout was performed prior to the initiation of the procedure. Patient was positioned supine position on the fluoroscopy table. The indwelling percutaneous gastrostomy tube was injected in AP and lateral, confirming that the tube had been withdrawn from the stomach lumen, into the abdominal wall. The upper abdomen and the G-tube were then prepped and draped in the usual sterile fashion. Using moderate sedation, the tube was replaced. The balloon remained inflated, and a 5 [place] Kumpe the catheter with contrast was used to navigate through the existing tract into the gastric lumen. Once the catheter was confirmed within the gastric lumen, a Rosen wire was placed. The catheter and the existing gastrostomy to were removed from the tract. Dilation of the soft tissue was performed with 16 [place] dilator. Catheter was advanced over the [facility] wire, and the wire was exchanged for Amplatz wire. 45 French peel-away was then placed over the Amplatz wire into the stomach lumen. With the coaxial wire in place, the peel-away dilator was removed, the Kumpe catheter was inserted through the lumen of the new 48 French balloon retention Intuit gastrostomy tube, and then the coaxial Kumpe the catheter and gastrostomy were inserted through the peel-away sheath into the stomach. Peel-away sheath was removed. Contrast injected through the med port of the gastrostomy tube confirmed location. The wire and coaxial catheter were removed after inflation of the balloon retention. Final image was stored. Patient tolerated the procedure well and remained hemodynamically stable throughout. No complications were encountered and no significant blood loss.  IMPRESSION: Status post exchange/rescue of a displaced percutaneous gastrostomy, with placement of a new 49 French balloon retention tube. Signed, Dulcy Fanny. Earleen Newport, DO [facility] Electronically Signed   By: Corrie Mckusick D.O.  On: 02/25/2017 16:32   Dg Abd 2 Views  Result Date: 02/27/2017 CLINICAL DATA:  Abdomen discomfort by newly placed G tube - Wednesday , 02/25/17; Hx diffuse large B-cell lymphoma EXAM: ABDOMEN - 2 VIEW COMPARISON:  02/25/2017 and previous FINDINGS: Visualized lung bases clear. No free air. Normal bowel gas pattern. Gastrostomy catheter projects in expected location. Scattered residual oral contrast in the colon. Gastropexy fasteners have advanced to the region of the cecum. No abnormal abdominal calcifications. Bilateral hip arthroplasty hardware incompletely visualized. IMPRESSION: Stable postop changes.  No acute disease. Electronically Signed   By: Lucrezia Europe M.D.   On: 02/27/2017 11:07   Dg Swallowing Func-speech Pathology  Result Date: 02/17/2017 CLINICAL DATA:  71 year old female diagnosed with Lymphoma including a mass at the left tracheoesophageal groove near the thyroid. Multiple imaging guided biopsies were performed, but ultimately a surgical biopsy of the mass was necessary on 12/26/2016 for diagnosis. The patient is now status post chemotherapy, with an unexplained cough, coughing with p.o. intake. EXAM: MODIFIED BARIUM SWALLOW TECHNIQUE: Different consistencies of barium were administered orally to the patient by the Speech Pathologist. Imaging of the pharynx was performed in the lateral projection. FLUOROSCOPY TIME:  Fluoroscopy Time:  0 minutes 54 seconds Radiation Exposure Index (if provided by the fluoroscopic device): 1.8mG y Number of Acquired Spot Images: 0 COMPARISON:  Neck CT 11/21/2016. FINDINGS: Nectar thick liquid- the patient is able to swallow liquid into the cervical esophagus, but with each swallow  there is barium extension from the cervical esophagus (C5/C6 level) through the posterior wall of the trachea into the tracheal lumen. This occurs about 18 mm below the level of the glottis (series 5, image 4). 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 PO barium liquid freely passed through the fistula and into the airway with each swallow. Study discussed by telephone with Dr. Hosie Poisson on 02/17/2017 at 1245 hours. Please refer to the Speech Pathologists report for complete details and recommendations. Electronically Signed   By: Genevie Ann M.D.   On: 02/17/2017 13:28        This case was discussed with Dr. Lindi Adie. He expresses agreement with my management of this patient.  We are very appreciative of Dr. Dortha Schwalbe willingness to take this patient and manage them as an inpatient.

## 2017-03-05 NOTE — Progress Notes (Signed)
Pharmacy Antibiotic Note  Kristen Escobar is a 71 y.o. female admitted on 03/05/2017 with febrile neutropenia. Pt is currently receiving chemo for diffuse large B-cell lymphoma.  Pharmacy has been consulted for vancomycin and zosyn dosing. Pharmacy also consulted to resume her recently started xarelto for new PE.   Plan: -Using AUC dosing give vancomycin 2gm IV x 1 then 750mg  q24h for a calculated AUC of 437, Scr 1.0 - Zosyn 3.375g IV Q8H infused over 4hrs. - daily Scr - follow cultures, clinical course and obtain vanc trough and peak as indicated - pt is currently on her 11th day of taking xarelto 15mg  twice daily for 21 days, continue and start  xarelto 20mg  daily on 03/16/17 - follow for s/s of bleeding  Height: 5\' 2"  (157.5 cm) Weight: 209 lb (94.8 kg) IBW/kg (Calculated) : 50.1  Temp (24hrs), Avg:98.7 F (37.1 C), Min:98 F (36.7 C), Max:99.4 F (37.4 C)  Recent Labs  Lab 02/27/17 0802 02/27/17 0802 03/05/17 1102 03/05/17 1102  WBC  --  12.5*  --  < 0.2*  CREATININE 0.9  --  1.0  --     Estimated Creatinine Clearance: 56.2 mL/min (by C-G formula based on SCr of 1 mg/dL).    No Known Allergies  Antimicrobials this admission: 11/15 vanc >> 11/15 zosyn >>  Dose adjustments this admission:   Microbiology results:  Thank you for allowing pharmacy to be a part of this patient's care.  Dolly Rias RPh 03/05/2017, 5:14 PM Pager [phone]

## 2017-03-05 NOTE — H&P (Signed)
[facility] History and Physical  Jariyah Hackley XVQ:[id number] DOB: [birth year] DOA: 03/05/2017   PCP: Marletta Lor, MD  Specialists: Dr. Lindi Adie is her oncologist  Chief Complaint: Fever  HPI: Athziry Millican is a 71 y.o. 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  This morning she woke up with a fever of 101 F.  She called her cancer center providers and was asked to come in for checkup. She was found to have neutropenia. She was subsequently transferred to the hospital for admission.  She has a cough which is chronic for the most part with no changes recently.  Denies any significant nausea or vomiting.  Denies any diarrhea.  No joint swellings.  No skin rashes.  No burning sensation with urinating.  Due to neutropenic fever she will be hospitalized for further management.  Home Medications: Prior to Admission medications   Medication Sig Start Date End Date Taking? Authorizing Provider  allopurinol (ZYLOPRIM) 300 MG tablet Take 1 tablet (300 mg total) by mouth daily. 01/01/17  Yes Nicholas Lose, MD  levothyroxine (SYNTHROID, LEVOTHROID) 50 MCG tablet Take 1 tablet (50 mcg total) daily by mouth. 03/02/17  Yes Marletta Lor, MD  metoCLOPramide (REGLAN) 5 MG tablet Take 1 tablet (5 mg total) 4 (four) times daily by mouth. 03/04/17  Yes Tanner, Lyndon Code., PA-C  omeprazole (PRILOSEC) 20 MG capsule Take 20 mg daily by mouth.   Yes [provider]  ondansetron (ZOFRAN) 8 MG tablet Take 1 tablet (8 mg total) every 8 (eight) hours as needed by mouth for refractory nausea / vomiting. Start day 3 after chemo. 03/04/17  Yes Harle Stanford., PA-C  Rivaroxaban 15 & 20 MG TBPK Take as directed on package: Start with one 15mg  tablet by mouth twice a day with food. On Day 22, switch to one 20mg  tablet  once a day with food. 02/23/17  Yes Patrecia Pour, MD  amoxicillin-clavulanate (AUGMENTIN) 875-125 MG tablet PLACE 1 TABLET INTO FEEDING TUBE TWICE A DAY 02/23/17   [provider]  CARAFATE 1 GM/10ML suspension TAKE 10 MLS (1 G TOTAL) BY MOUTH 4 (FOUR) TIMES DAILY - WITH MEALS AND AT BEDTIME. 01/17/17   [provider]  chlorpheniramine-HYDROcodone ([place]) 10-8 MG/5ML SUER TAKE 5MLS BY MOUTH EVERY 12 HOURS AS NEEDED FOR COUGH 02/01/17   [provider]  doxycycline (VIBRA-TABS) 100 MG tablet Take 1 tablet (100 mg total) 2 (two) times daily by mouth. 03/05/17   Tanner, Lyndon Code., PA-C  HYDROcodone-acetaminophen (NORCO) 7.5-325 MG tablet Take 1 tablet every 4 (four) hours as needed by mouth. for pain 12/31/16   [provider]  LORazepam (ATIVAN) 0.5 MG tablet Take 1 tablet (0.5 mg total) by mouth every 6 (six) hours as needed (Nausea or vomiting). 01/01/17   Nicholas Lose, MD  predniSONE (DELTASONE) 20 MG tablet Take 3 tablets (60 mg total) by mouth daily. Take on days 1-5 of chemotherapy. 01/01/17   Nicholas Lose, MD  prochlorperazine (COMPAZINE) 10 MG tablet Take 1 tablet (10 mg total) by mouth every 6 (six) hours as needed (Nausea or vomiting). 01/01/17   Nicholas Lose, MD  saccharomyces boulardii (FLORASTOR) 250 MG capsule Take 1 capsule (250 mg total) 2 (two) times daily by mouth. 02/23/17   Patrecia Pour, MD  sucralfate (CARAFATE) 1 g tablet Take 1 tablet (1 g total) 4 (four)  times daily -  with meals and at bedtime by mouth. 03/04/17   Harle Stanford., PA-C    Allergies: No Known Allergies  Past Medical History: Past Medical History:  Diagnosis Date  . Arthritis   . Cancer ([place]) 03/2009   breast- rt  . GERD (gastroesophageal reflux disease)   . History of radiation therapy 07/12/10,completed   right breast 60 Gy x30 fx  . Hypertension   . Hypothyroidism   . Obesity   . Peripheral vascular disease ([place]) 1995   PT DEVELOPED CIRCULATION PROBLEMS IN BOTH  HANDS AND GANGRENE OF BOTH INDEX FINGERS--REQUIRING AMPUTATION OF THE INDEX FINGERS AND VASCULAR SURGERY.  PT TOLD HER PROBLEMS RELATED TO SMOKING.   NO OTHER PROBLEMS SINCE.  Marland Kitchen Pneumonia   . Thyroid mass     Past Surgical History:  Procedure Laterality Date  . AMPUTATION     partial amputation of both index fingers  . BREAST SURGERY  2011   lumpectomy with node sampling- RIGHT  . COLONOSCOPY    . ESOPHAGOGASTRODUODENOSCOPY    . EXCISION MASS NECK Left 12/26/2016   Procedure: EXCISION MASS NECK;  Surgeon: Izora Gala, MD;  Location: [facility];  Service: ENT;  Laterality: Left;  open excision of thyroid mass left side with frozen section  . HAND SURGERY Bilateral 1995   Amputaed pointer fingers bilaterally  . IR FLUORO GUIDE PORT INSERTION RIGHT  01/14/2017  . IR GASTROSTOMY TUBE MOD SED  02/19/2017  . IR REPLC GASTRO/COLONIC TUBE PERCUT W/FLUORO  02/25/2017  . IR US GUIDE VASC ACCESS RIGHT  01/14/2017  . TOTAL HIP ARTHROPLASTY  12/16/2011   right hip  . TOTAL HIP ARTHROPLASTY  01/20/2012   Procedure: TOTAL HIP ARTHROPLASTY ANTERIOR APPROACH;  Surgeon: Mauri Pole, MD;  Location: [facility];  Service: Orthopedics;  Laterality: Left;  . TUBAL LIGATION    . VASCULAR SURGERY     both hands    Social History:   Social History   Socioeconomic History  . Marital status: Married    Spouse name: Not on file  . Number of children: Not on file  . Years of education: Not on file  . Highest education level: Not on file  Social Needs  . Financial resource strain: Not on file  . Food insecurity - worry: Not on file  . Food insecurity - inability: Not on file  . Transportation needs - medical: Not on file  . Transportation needs - non-medical: Not on file  Occupational History  . Not on file  Tobacco Use  . Smoking status: Former Smoker    Packs/day: 1.00    Years: 20.00    Pack years: 20.00    Last attempt to quit: 04/21/1993    Years since quitting: 23.8  . Smokeless tobacco: Never Used    Substance and Sexual Activity  . Alcohol use: Yes    Comment: OCCAS - MAYBE ONCE A MONTH  . Drug use: No  . Sexual activity: Yes  Other Topics Concern  . Not on file  Social History Narrative  . Not on file    Family History:  Family History  Problem Relation Age of Onset  . Cancer Mother        pt unaware of what kind  . Hearing loss Mother   . Coronary artery disease Father   . Diabetes Father   . Coronary artery disease Brother   . Coronary artery disease Brother   . Diabetes Sister   .  Cancer Sister        breast  . Colon cancer Neg Hx   . Stomach cancer Neg Hx   . Rectal cancer Neg Hx   . Esophageal cancer Neg Hx      Review of Systems - History obtained from the patient General ROS: positive for  - fatigue and fever Psychological ROS: negative Ophthalmic ROS: negative ENT ROS: negative Allergy and Immunology ROS: negative Hematological and Lymphatic ROS: negative Endocrine ROS: negative Respiratory ROS: cough Cardiovascular ROS: no chest pain or dyspnea on exertion Gastrointestinal ROS: no abdominal pain, change in bowel habits, or black or bloody stools Genito-Urinary ROS: no dysuria, trouble voiding, or hematuria Musculoskeletal ROS: negative Neurological ROS: no TIA or stroke symptoms Dermatological ROS: negative  Physical Examination  Vitals:   03/05/17 1554  BP: (!) 129/57  Pulse: 99  Resp: 18  Temp: 98 F (36.7 C)  TempSrc: Oral  SpO2: 95%  Weight: 94.8 kg (209 lb)  Height: 5\' 2"  (1.575 m)    BP (!) 129/57 (BP Location: Right Arm)   Pulse 99   Temp 98 F (36.7 C) (Oral)   Resp 18   Ht 5\' 2"  (1.575 m)   Wt 94.8 kg (209 lb)   SpO2 95%   BMI 38.23 kg/m   General appearance: alert, cooperative, appears stated age and no distress Head: Normocephalic, without obvious abnormality, atraumatic Eyes: conjunctivae/corneas clear. PERRL, EOM's intact. Fundi benign. Throat: lips, mucosa, and tongue normal; teeth and gums normal Neck: no  adenopathy, no carotid bruit, no JVD, supple, symmetrical, trachea midline and thyroid not enlarged, symmetric, no tenderness/mass/nodules Resp: Coarse sounds bilaterally.  No wheezing or rhonchi.  Few crackles at the bases. Cardio: regular rate and rhythm, S1, S2 normal, no murmur, click, rub or gallop GI: soft, non-tender; bowel sounds normal; no masses,  no organomegaly and PEG tube is present Extremities: extremities normal, atraumatic, no cyanosis or edema Pulses: 2+ and symmetric Skin: Skin color, texture, turgor normal. No rashes or lesions Lymph nodes: Cervical, supraclavicular, and axillary nodes normal. Neurologic: No focal deficit   Labs on Admission: I have personally reviewed following labs and imaging studies  CBC: Recent Labs  Lab 02/27/17 0802 03/05/17 1102  WBC 12.5* < 0.2*  NEUTROABS 10.3*  --   HGB 9.0* 8.3*  HCT 28.4* 26.1*  MCV 88.5 87.9  PLT 462* 64*   Basic Metabolic Panel: Recent Labs  Lab 02/27/17 0802 03/05/17 1102  NA 136 136  K 3.8 4.0  CO2 22 22  GLUCOSE 136 142*  BUN 13.0 15.4  CREATININE 0.9 1.0  CALCIUM 9.1 8.3*   GFR: Estimated Creatinine Clearance: 56.2 mL/min (by C-G formula based on SCr of 1 mg/dL). Liver Function Tests: Recent Labs  Lab 02/27/17 0802 03/05/17 1102  AST 130* 36*  ALT 210* 309*  ALKPHOS 48 51  BILITOT 0.49 1.36*  PROT 6.3* 6.0*  ALBUMIN 2.3* 2.4*    Radiological Exams on Admission: Dg Chest 2 View  Result Date: 03/05/2017 CLINICAL DATA:  Productive cough, fever. EXAM: CHEST  2 VIEW COMPARISON:  Radiographs of February 24, 2017. FINDINGS: The heart size and mediastinal contours are within normal limits. Atherosclerosis of thoracic aorta is noted. No pneumothorax or pleural effusion is noted. Right internal jugular Port-A-Cath is unchanged in position. Mild central pulmonary vascular congestion is noted. Mild right basilar opacity is noted concerning for possible atelectasis or infiltrate. The visualized  skeletal structures are unremarkable. IMPRESSION: Aortic atherosclerosis. Mild right basilar subsegmental atelectasis or  infiltrate is noted. Electronically Signed   By: Marijo Conception, M.D.   On: 03/05/2017 10:34    Problem List  Principal Problem:   Neutropenic fever ([place]) Active Problems:   Breast cancer of upper-outer quadrant of right female breast ([place])   Hypothyroidism   Diffuse large B cell lymphoma ([place])   Port-A-Cath in place   Tracheoesophageal fistula, acquired ([place])   Pulmonary embolism ([place])   Assessment: This is a 71 year old Caucasian past medical history as stated earlier who presents with fever.  She is found to be neutropenic.  Neutropenia is most likely due to chemotherapy that she received on 11/9.  She also received pegfilgrastim on 11/9.  Plan: #1 neutropenic fever/pancytopenia: No obvious source of infection is found.  Chest x-ray did raise concern for atelectasis versus infiltrate in the right lung however patient's cough has been stable according to patient and her husband.  There has been no new changes in the last few days.  So this finding is most likely atelectasis.  She will be placed on vancomycin and Zosyn.  She does have a tracheoesophageal fistula.  There could be a component of aspiration.  Await culture data.  Repeat CBC with differential in the morning.  Monitor hemoglobin and platelet counts.  #2  Diffuse large B-cell lymphoma: Being treated with R CHOP by oncology.  Last cycle was on 11/9 which was her third cycle.  #3  Tracheoesophageal fistula: Seen by cardiothoracic surgery during previous hospitalization.  She will need to be referred to a tertiary center for further management.  This will be done once she is medically optimized.  #4  Recently diagnosed pulmonary embolism: This has been treated with anticoagulation.  She is feeling better from that standpoint.  She is still on twice daily dosing of Xarelto.  We requested pharmacy to assist with  dosing this medication.  Twice daily dosing to continue till 11/25 and then 20 mg once daily to start on 11/26.  #5  Hypothyroidism: Continue home medications  #6 Nutrition: As a result of the tracheoesophageal fistula patient is unable to take anything by mouth.  She has a PEG tube in place.  She does bolus feeding at home.  We will consult nutritionist.  We will initiate continuous feeding in the hospital.  #7 transaminitis: Patient has had elevated LFTs in the past as well.  Continue to monitor.  DVT Prophylaxis: On Xarelto Code Status: Full code Family Communication: Discussed with the patient and her husband Consults called: None  Severity of Illness: The appropriate patient status for this patient is INPATIENT. Inpatient status is judged to be reasonable and necessary in order to provide the required intensity of service to ensure the patient's safety. [name] presenting symptoms, physical exam findings, and initial radiographic and laboratory data in the context of their chronic comorbidities is felt to place them at high risk for further clinical deterioration. Furthermore, it is not anticipated that the patient will be medically stable for discharge from the hospital within 2 midnights of admission. The following factors support the patient status of inpatient.   " [name] presenting symptoms include fever. " The worrisome physical exam findings include fever. " The initial radiographic and laboratory data are worrisome because of neutropenia. " The chronic co-morbidities include lymphoma.   * I certify that at the point of admission it is my clinical judgment that the patient will require inpatient hospital care spanning beyond 2 midnights from the point of admission due to high  intensity of service, high risk for further deterioration and high frequency of surveillance required.*  Further management decisions will depend on results of further testing and patient's  response to treatment.   Bonnielee Haff  [facility] Pager [phone]  If 7PM-7AM, please contact night-coverage [email or web address] Password [email or web address]  03/05/2017, 6:06 PM

## 2017-03-05 NOTE — Progress Notes (Signed)
Patient is to be admitted for febrile neutropenia.  Report called to [facility], RN for [employer], Therapist, sports. Transported via w/c and IV pump to room #1342.

## 2017-03-06 ENCOUNTER — Other Ambulatory Visit: Payer: Medicare Other

## 2017-03-06 ENCOUNTER — Encounter: Payer: Medicare Other | Admitting: Nutrition

## 2017-03-06 ENCOUNTER — Encounter: Payer: Medicare Other | Admitting: Medical

## 2017-03-06 DIAGNOSIS — I2782 Chronic pulmonary embolism: Secondary | ICD-10-CM

## 2017-03-06 DIAGNOSIS — R5081 Fever presenting with conditions classified elsewhere: Secondary | ICD-10-CM

## 2017-03-06 DIAGNOSIS — C8331 Diffuse large B-cell lymphoma, lymph nodes of head, face, and neck: Secondary | ICD-10-CM

## 2017-03-06 DIAGNOSIS — J860 Pyothorax with fistula: Secondary | ICD-10-CM

## 2017-03-06 DIAGNOSIS — C8330 Diffuse large B-cell lymphoma, unspecified site: Secondary | ICD-10-CM

## 2017-03-06 DIAGNOSIS — E039 Hypothyroidism, unspecified: Secondary | ICD-10-CM

## 2017-03-06 DIAGNOSIS — D709 Neutropenia, unspecified: Principal | ICD-10-CM

## 2017-03-06 LAB — CBC WITH DIFFERENTIAL/PLATELET
HCT: 21 % — ABNORMAL LOW (ref 36.0–46.0)
HEMOGLOBIN: 7.1 g/dL — AB (ref 12.0–15.0)
MCH: 29.3 pg (ref 26.0–34.0)
MCHC: 33.8 g/dL (ref 30.0–36.0)
MCV: 86.8 fL (ref 78.0–100.0)
Platelets: 32 10*3/uL — ABNORMAL LOW (ref 150–400)
RBC: 2.42 MIL/uL — AB (ref 3.87–5.11)
RDW: 18.5 % — ABNORMAL HIGH (ref 11.5–15.5)
WBC: 0.1 10*3/uL — CL (ref 4.0–10.5)

## 2017-03-06 LAB — BASIC METABOLIC PANEL
Anion gap: 4 — ABNORMAL LOW (ref 5–15)
BUN: 13 mg/dL (ref 6–20)
CHLORIDE: 110 mmol/L (ref 101–111)
CO2: 22 mmol/L (ref 22–32)
Calcium: 7.4 mg/dL — ABNORMAL LOW (ref 8.9–10.3)
Creatinine, Ser: 0.97 mg/dL (ref 0.44–1.00)
GFR calc Af Amer: >60 mL/min (ref >60)
GFR calc non Af Amer: 58 mL/min — ABNORMAL LOW (ref >60)
Glucose, Bld: 212 mg/dL — ABNORMAL HIGH (ref 65–99)
POTASSIUM: 3.8 mmol/L (ref 3.5–5.1)
Sodium: 136 mmol/L (ref 135–145)

## 2017-03-06 LAB — URINALYSIS, ROUTINE W REFLEX MICROSCOPIC
Bacteria, UA: NONE SEEN
Bilirubin Urine: NEGATIVE
Glucose, UA: NEGATIVE mg/dL
Hgb urine dipstick: NEGATIVE
KETONES UR: NEGATIVE mg/dL
Leukocytes, UA: NEGATIVE
Nitrite: NEGATIVE
PROTEIN: NEGATIVE mg/dL
Specific Gravity, Urine: 1.018 (ref 1.005–1.030)
pH: 6 (ref 5.0–8.0)

## 2017-03-06 LAB — GLUCOSE, CAPILLARY
GLUCOSE-CAPILLARY: 140 mg/dL — AB (ref 65–99)
GLUCOSE-CAPILLARY: 156 mg/dL — AB (ref 65–99)
GLUCOSE-CAPILLARY: 179 mg/dL — AB (ref 65–99)
GLUCOSE-CAPILLARY: 191 mg/dL — AB (ref 65–99)
Glucose-Capillary: 166 mg/dL — ABNORMAL HIGH (ref 65–99)
Glucose-Capillary: 146 mg/dL — ABNORMAL HIGH (ref 65–99)
Glucose-Capillary: 156 mg/dL — ABNORMAL HIGH (ref 65–99)

## 2017-03-06 LAB — PREPARE RBC (CROSSMATCH)

## 2017-03-06 MED ORDER — OSMOLITE 1.5 CAL PO LIQD
1000.0000 mL | ORAL | Status: DC
  Administered 2017-03-06 – 2017-03-07 (×2): 1000 mL
  Filled 2017-03-06 (×6): qty 1000

## 2017-03-06 MED ORDER — DEXTROSE 5 % IV SOLN
2.0000 g | Freq: Three times a day (TID) | INTRAVENOUS | Status: DC
  Administered 2017-03-06 – 2017-03-09 (×9): 2 g via INTRAVENOUS
  Filled 2017-03-06 (×11): qty 2

## 2017-03-06 MED ORDER — ACETAMINOPHEN 325 MG PO TABS: 650.0000 mg | ORAL_TABLET | Freq: Four times a day (QID) | ORAL | Status: DC | PRN

## 2017-03-06 MED ORDER — DEXTROSE 5 % IV SOLN
2.0000 g | INTRAVENOUS | Status: AC
  Administered 2017-03-06: 2 g via INTRAVENOUS
  Filled 2017-03-06: qty 2

## 2017-03-06 MED ORDER — METOCLOPRAMIDE HCL 5 MG PO TABS: 5.0000 mg | ORAL_TABLET | Freq: Three times a day (TID) | ORAL | Status: DC

## 2017-03-06 MED ORDER — FREE WATER
50.0000 mL | Status: DC
  Administered 2017-03-06 – 2017-03-09 (×17): 50 mL

## 2017-03-06 MED ORDER — SODIUM CHLORIDE 0.9 % IV SOLN: Freq: Once | INTRAVENOUS | Status: DC

## 2017-03-06 MED ORDER — FUROSEMIDE 10 MG/ML IJ SOLN
20.0000 mg | Freq: Once | INTRAMUSCULAR | Status: AC
  Administered 2017-03-06: 20 mg via INTRAVENOUS
  Filled 2017-03-06: qty 2

## 2017-03-06 MED ORDER — METOCLOPRAMIDE HCL 5 MG/ML IJ SOLN
5.0000 mg | Freq: Three times a day (TID) | INTRAMUSCULAR | Status: DC
  Administered 2017-03-06 – 2017-03-07 (×3): 5 mg via INTRAVENOUS
  Filled 2017-03-06 (×3): qty 2

## 2017-03-06 NOTE — Progress Notes (Signed)
Dr Sheran Fava notified that tylenol 650 mg was given at 1500 .1540 tEMP 101.4. I have spoken with Dr Sheran Fava, she advice to keep a check on temp and hold off on administering PRBC"S until temp has come down

## 2017-03-06 NOTE — Progress Notes (Signed)
Pharmacy Antibiotic Note  Kristen Escobar is a 71 y.o. female with recently diagnosed diffuse large B-cell lymphoma (last chemo 02/27/2017) admitted on 03/05/2017 with febrile neutropenia. Pharmacy initially consulted for Vancomycin and Zosyn dosing. Zosyn now transitioned to Cefepime.    Plan: - Continue Vancomycin 750mg  IV q24h for an estimated AUC 432. Obtain Vancomycin peak and trough at steady state, as indicated.  - Cefepime 2g IV q8h - Monitor renal function, cultures, clinical course.    Height: 5\' 2"  (157.5 cm) Weight: 209 lb 3.5 oz (94.9 kg) IBW/kg (Calculated) : 50.1  Temp (24hrs), Avg:99.2 F (37.3 C), Min:98 F (36.7 C), Max:99.9 F (37.7 C)  Recent Labs  Lab 03/05/17 1102 03/05/17 1102 03/06/17 0411  WBC  --  < 0.2* 0.1*  CREATININE 1.0  --  0.97    Estimated Creatinine Clearance: 57.9 mL/min (by C-G formula based on SCr of 0.97 mg/dL).    No Known Allergies  Antimicrobials this admission: 11/15 vanc >> 11/15 zosyn >> 11/16 11/16 cefepime >>  Dose adjustments this admission: --  Microbiology results: 11/16 BCx: sent  Thank you for allowing pharmacy to be a part of this patient's care.   Lindell Spar, PharmD, BCPS Pager: [phone] 03/06/2017 11:10 AM

## 2017-03-06 NOTE — Progress Notes (Addendum)
Initial Nutrition Assessment  DOCUMENTATION CODES:   Obesity unspecified  INTERVENTION:  - Will adjust TF regimen: Osmolite 1.5 @ 60 mL/hr. This regimen will provide 2160 kcal, 90 grams of protein (95% minimum estimated protein need), and 1097 mL free water.  - Will order 50 mL free water every 4 hours (300 mL/day).  - Recommend Reglan as pt and husband felt it helped the day it was used at home.   Recommend return to bolus TF at home and that pt follow-up with [facility] RD for further outpatient management.    NUTRITION DIAGNOSIS:   Increased nutrient needs related to catabolic illness, cancer and cancer related treatments as evidenced by estimated needs.  GOAL:   Patient will meet greater than or equal to 90% of their needs  MONITOR:   TF tolerance, Weight trends, Labs  REASON FOR ASSESSMENT:   Consult Enteral/tube feeding initiation and management  ASSESSMENT:   71 y.o. 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  This morning she woke up with a fever of 101 F.  She called her cancer center providers and was asked to come in for checkup. She was found to have neutropenia.   BMI indicates obesity. Pt is NPO at baseline d/t tracheoesophageal fistula. She has PEG in place which needed replacement in the ED within the past 1-1.5 weeks d/t dislodgement. Pt was last seen by a [facility] RD on 11/5 at which time discussion was had about slowly increasing TF regimen. She was doing 1/2 carton Osmolite 1.5 QID with goal of 1 carton Osmolite 1.5 x6/day. Pt's voice is hoarse so her husband, who is at bedside, provides nearly all information.   Since d/c on 11/6, pt experienced one episode of projectile vomiting on 11/9 after chemo treatment that day (third cycle). She has also been experiencing increased weakness and  lethargy since that time which is usual for her. Husband states that today (day #7 post-chemo) is always her "low point." While at home they had been aware of need to increase TF volume and frequency throughout the day but report several set backs to this including episode of projectile vomiting, need for PEG replacement, another ED visit which lasted 6+ hours, a day of having diarrhea, and current admission. Husband states that a few days ago Reglan was prescribed and on that day pt was able to tolerate one more feeding than usual which he and pt took as a good sign. They are aware of need to slowly advance TF at home d/t prolonged period with inadequate nutrition and likely some atrophy to stomach d/t this.  Pt is currently ordered Jevity 1.2 @ 50 mL/hr which is providing 1440 kcal, 67 grams of protein, and 968 mL free water. This was started yesterday evening and pt denies any nausea or abdominal pain since it started. Will adjust TF regimen as outlined above. Weight has been stable for the past 1 month.   Medications reviewed; 20 mg IV Lasix x1 dose today, 50 mcg oral Synthroid/day, 250 mg Florastor BID.  Labs reviewed; CBGs: 166, 191, and 179 mg/dL today, Ca: 7.4 mg/dL.     NUTRITION - FOCUSED PHYSICAL EXAM: No muscle or fat wasting noted at this time. Mild generalized edema.    Diet Order:  Diet NPO time specified  EDUCATION NEEDS:   No education needs have been identified at this time  Skin:  Skin Assessment: Reviewed RN Assessment  Last BM:  11/14  Height:   Ht Readings from Last 1 Encounters:  03/06/17 5\' 2"  (1.575 m)    Weight:   Wt Readings from Last 1 Encounters:  03/06/17 209 lb 3.5 oz (94.9 kg)    Ideal Body Weight:  50 kg  BMI:  Body mass index is 38.27 kg/m.  Estimated Nutritional Needs:   Kcal:  3403-7096 (23-25 kcal/kg)  Protein:  95-115 grams (1-1.2 grams/kg)  Fluid:  >/= 2 L/day     Jarome Matin, MS, RD, LDN, [facility] Inpatient Clinical  Dietitian Pager # [phone] After hours/weekend pager # [phone]

## 2017-03-06 NOTE — Progress Notes (Signed)
HEMATOLOGY-ONCOLOGY PROGRESS NOTE  SUBJECTIVE: Hospitalization for neutropenic fever. Patient appears to have a low-grade temperature of 100.3.  This is in spite of IV antibiotics. PEG tube feeds  OBJECTIVE: REVIEW OF SYSTEMS:   Constitutional: Denies fevers, chills or abnormal weight loss Eyes: Denies blurriness of vision Ears, nose, mouth, throat, and face: Denies mucositis or sore throat Respiratory: Dry cough Cardiovascular: Denies palpitation, chest discomfort Gastrointestinal: PEG tube feeds Skin: Denies abnormal skin rashes Lymphatics: Denies new lymphadenopathy or easy bruising Neurological:Denies numbness, tingling or new weaknesses Behavioral/Psych: Mood is stable, no new changes  Extremities: No lower extremity edema  All other systems were reviewed with the patient and are negative.  I have reviewed the past medical history, past surgical history, social history and family history with the patient and they are unchanged from previous note.   PHYSICAL EXAMINATION: ECOG PERFORMANCE STATUS: 1 - Symptomatic but completely ambulatory  Vitals:   03/06/17 1539 03/06/17 1645  BP:  (!) 121/49  Pulse:  94  Resp:  16  Temp: (!) 101.4 F (38.6 C) (!) 100.6 F (38.1 C)  SpO2:  97%   Filed Weights   03/05/17 1554 03/06/17 0452  Weight: 209 lb (94.8 kg) 209 lb 3.5 oz (94.9 kg)    GENERAL:alert, no distress and comfortable SKIN: skin color, texture, turgor are normal, no rashes or significant lesions EYES: normal, Conjunctiva are pink and non-injected, sclera clear OROPHARYNX:no exudate, no erythema and lips, buccal mucosa, and tongue normal  NECK: supple, thyroid normal size, non-tender, without nodularity LYMPH:  no palpable lymphadenopathy in the cervical, axillary or inguinal LUNGS: clear to auscultation and percussion with normal breathing effort HEART: regular rate & rhythm and no murmurs and no lower extremity edema ABDOMEN:PEG tube Musculoskeletal:no cyanosis  of digits and no clubbing  NEURO: alert & oriented x 3 with fluent speech, no focal motor/sensory deficits  LABORATORY DATA:  I have reviewed the data as listed CMP Latest Ref Rng & Units 03/06/2017 03/05/2017 02/27/2017  Glucose 65 - 99 mg/dL 212(H) 142(H) 136  BUN 6 - 20 mg/dL 13 15.4 13.0  Creatinine 0.44 - 1.00 mg/dL 0.97 1.0 0.9  Sodium 135 - 145 mmol/L 136 136 136  Potassium 3.5 - 5.1 mmol/L 3.8 4.0 3.8  Chloride 101 - 111 mmol/L 110 - -  CO2 22 - 32 mmol/L 22 22 22   Calcium 8.9 - 10.3 mg/dL 7.4(L) 8.3(L) 9.1  Total Protein 6.4 - 8.3 g/dL - 6.0(L) 6.3(L)  Total Bilirubin 0.20 - 1.20 mg/dL - 1.36(H) 0.49  Alkaline Phos 40 - 150 U/L - 51 48  AST 5 - 34 U/L - 36(H) 130(H)  ALT 0 - 55 U/L - 309(HH) 210(H)    Lab Results  Component Value Date   WBC 0.1 (LL) 03/06/2017   HGB 7.1 (L) 03/06/2017   HCT 21.0 (L) 03/06/2017   MCV 86.8 03/06/2017   PLT 32 (L) 03/06/2017   NEUTROABS 10.3 (H) 02/27/2017    ASSESSMENT AND PLAN: 1.  Neutropenic fever: Currently on cefepime. Vancomycin has been discontinued because there is no skin evidence of infection. Patient received Neulasta so she does not need Neupogen injections. Our plan is to monitor and continue broad-spectrum antibiotics until Camargito recovers over 500 and fevers go away.  2. pulmonary embolism: Anticoagulation on hold because of thrombocytopenia.  I discussed with them that if the platelet count stabilizes around 30 then they can resume her anticoagulation.  3.  Severe anemia: Blood transfusion being given today. 4.  Elevated LFTs:  These will need to be monitored closely.  Her next chemotherapy dose will definitely be reduced.

## 2017-03-06 NOTE — Progress Notes (Signed)
PROGRESS NOTE  Kristen Escobar  FGH:[id number] DOB: November 25, 1945 DOA: 03/05/2017 PCP: Marletta Lor, MD  Brief Narrative:   Kristen Escobar is a 71 y.o. 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  This morning she woke up with a fever of 101 F.  She called her cancer center providers and was asked to come in for checkup. She was found to have neutropenia. She was subsequently transferred to the hospital for admission.  She has a cough which is chronic for the most part with no changes recently.  Denies any significant nausea or vomiting.  Denies any diarrhea.  No joint swellings.  No skin rashes.  No burning sensation with urinating.  Due to neutropenic fever she will be hospitalized for further management.  Shortness initially started on vancomycin and Zosyn however she did not have any evidence of port infection or cellulitis so her vancomycin has been discontinued.  Her Zosyn was changed to cefepime due to her pancytopenia.  Continues to have intermittent fevers.  We attempted to give her a blood transfusion today but we have held off because it is not urgent and she continued to spike fevers.    Assessment & Plan:   Principal Problem:   Neutropenic fever ([place]) Active Problems:   Breast cancer of upper-outer quadrant of right female breast ([place])   Hypothyroidism   Diffuse large B cell lymphoma ([place])   Port-A-Cath in place   Tracheoesophageal fistula, acquired ([facility])   Pulmonary embolism ([facility])  Neutropenic fever/pancytopenia: No obvious source of infection although she has a tracheoesophageal fistula with chronic cough and her chest x-ray was concerning for either atelectasis or infiltrate in the right lung.  UA not obtained -Discontinue vancomycin and Zosyn -Start cefepime -Patient received  Neulasta -Her degree of anemia and thrombocytopenia are worse after this cycle of chemotherapy -We will transfuse 1 unit PRBC when her fever has resolved or if her hemoglobin trends less than 7 g/dL -Platelet count less than 50,000 so holding her Xarelto for today -Repeat CBC with differential tomorrow -Blood cultures no growth to date -Urinalysis and urine culture added today  #2  Diffuse large B-cell lymphoma: Being treated with R CHOP by oncology.  Last cycle was on 11/9 which was her third cycle.  #3  Tracheoesophageal fistula: Seen by cardiothoracic surgery during previous hospitalization.  She will need to be referred to a tertiary center for further management.  This will be done once she is medically optimized.  #4  Recently diagnosed pulmonary embolism: This has been treated with anticoagulation.  She is feeling better from that standpoint.   -Holding Xarelto due to thrombocytopenia - Once platelet count is close to 50,000, resume twice daily dosing to continue till 11/25 and then 20 mg once daily to start on 11/26.  #5  Hypothyroidism: Continue home medications  #6 Nutrition: As a result of the tracheoesophageal fistula patient is unable to take anything by mouth.  She has a PEG tube in place.  She does bolus feeding at home.  We will consult nutritionist.  We will initiate continuous feeding in the hospital. -Add Reglan  #7 transaminitis: Patient has had elevated LFTs in the past as well.  Continue to monitor.   DVT prophylaxis: SCDs Code Status: Full code Family Communication: Patient and her husband Disposition Plan: Likely to home in a few days  Consultants:   Spoke with Dr. Lindi Adie by phone  Procedures:  None  Antimicrobials:  Anti-infectives (From admission, onward)   Start     Dose/Rate Route Frequency Ordered Stop   03/06/17 1800  vancomycin (VANCOCIN) IVPB 750 mg/150 ml premix  Status:  Discontinued     750 mg 150 mL/hr over 60 Minutes Intravenous  Every 24 hours 03/05/17 1718 03/06/17 1208   03/06/17 1800  ceFEPIme (MAXIPIME) 2 g in dextrose 5 % 50 mL IVPB     2 g 100 mL/hr over 30 Minutes Intravenous Every 8 hours 03/06/17 1124     03/06/17 0830  ceFEPIme (MAXIPIME) 2 g in dextrose 5 % 50 mL IVPB     2 g 100 mL/hr over 30 Minutes Intravenous STAT 03/06/17 0819 03/06/17 1111   03/05/17 1800  vancomycin (VANCOCIN) 2,000 mg in sodium chloride 0.9 % 500 mL IVPB     2,000 mg 250 mL/hr over 120 Minutes Intravenous  Once 03/05/17 1700 03/05/17 1958   03/05/17 1715  piperacillin-tazobactam (ZOSYN) IVPB 3.375 g  Status:  Discontinued     3.375 g 12.5 mL/hr over 240 Minutes Intravenous Every 8 hours 03/05/17 1702 03/06/17 0805   03/05/17 1645  piperacillin-tazobactam (ZOSYN) IVPB 3.375 g  Status:  Discontinued     3.375 g 100 mL/hr over 30 Minutes Intravenous  Once 03/05/17 1634 03/05/17 1635       Subjective:  Patient states that she has chronic cough productive of clear to yellow phlegm which is unchanged in color and quantity.  Her cough is not worsened and she denies shortness of breath.  She denies dysuria.  She denies abdominal pain, chest pain, skin ulcers or redness or irritation.  Objective: Vitals:   03/06/17 1645 03/06/17 1728 03/06/17 1813 03/06/17 1858  BP: (!) 121/49 128/60  (!) 123/59  Pulse: 94 90  92  Resp: 16 (!) 22  20  Temp: (!) 100.6 F (38.1 C) 100 F (37.8 C) 99.9 F (37.7 C) 100.3 F (37.9 C)  TempSrc: Oral Oral  Oral  SpO2: 97% 97%  97%  Weight:      Height:        Intake/Output Summary (Last 24 hours) at 03/06/2017 1910 Last data filed at 03/06/2017 0452 Gross per 24 hour  Intake 450 ml  Output 550 ml  Net -100 ml   Filed Weights   03/05/17 1554 03/06/17 0452  Weight: 94.8 kg (209 lb) 94.9 kg (209 lb 3.5 oz)    Examination:  General exam:  Adult female.  No acute distress.  HEENT:  NCAT, MMM Respiratory system: Clear to auscultation bilaterally Cardiovascular system: Regular rate and  rhythm, normal S1/S2. No murmurs, rubs, gallops or clicks.  Warm extremities.  Port is accessed Gastrointestinal system: Normal active bowel sounds, soft, nondistended, nontender. MSK:  Normal tone and bulk, no lower extremity edema Neuro:  Grossly intact    Data Reviewed: I have personally reviewed following labs and imaging studies  CBC: Recent Labs  Lab 03/05/17 1102 03/06/17 0411  WBC < 0.2* 0.1*  HGB 8.3* 7.1*  HCT 26.1* 21.0*  MCV 87.9 86.8  PLT 64* 32*   Basic Metabolic Panel: Recent Labs  Lab 03/05/17 1102 03/06/17 0411  NA 136 136  K 4.0 3.8  CL  --  110  CO2 22 22  GLUCOSE 142* 212*  BUN 15.4 13  CREATININE 1.0 0.97  CALCIUM 8.3* 7.4*   GFR: Estimated Creatinine Clearance: 57.9 mL/min (by C-G formula based on  SCr of 0.97 mg/dL). Liver Function Tests: Recent Labs  Lab 03/05/17 1102  AST 36*  ALT 309*  ALKPHOS 51  BILITOT 1.36*  PROT 6.0*  ALBUMIN 2.4*   No results for input(s): LIPASE, AMYLASE in the last 168 hours. No results for input(s): AMMONIA in the last 168 hours. Coagulation Profile: No results for input(s): INR, PROTIME in the last 168 hours. Cardiac Enzymes: No results for input(s): CKTOTAL, CKMB, CKMBINDEX, TROPONINI in the last 168 hours. BNP (last 3 results) No results for input(s): PROBNP in the last 8760 hours. HbA1C: No results for input(s): HGBA1C in the last 72 hours. CBG: Recent Labs  Lab 03/06/17 0103 03/06/17 0449 03/06/17 0757 03/06/17 1304 03/06/17 1732  GLUCAP 166* 191* 179* 146* 156*   Lipid Profile: No results for input(s): CHOL, HDL, LDLCALC, TRIG, CHOLHDL, LDLDIRECT in the last 72 hours. Thyroid Function Tests: No results for input(s): TSH, T4TOTAL, FREET4, T3FREE, THYROIDAB in the last 72 hours. Anemia Panel: No results for input(s): VITAMINB12, FOLATE, FERRITIN, TIBC, IRON, RETICCTPCT in the last 72 hours. Urine analysis:    Component Value Date/Time   COLORURINE YELLOW 02/24/2017 1900   APPEARANCEUR  HAZY (A) 02/24/2017 1900   LABSPEC 1.020 02/24/2017 1900   PHURINE 6.0 02/24/2017 1900   GLUCOSEU NEGATIVE 02/24/2017 1900   GLUCOSEU NEGATIVE 07/24/2009 0750   HGBUR SMALL (A) 02/24/2017 1900   BILIRUBINUR NEGATIVE 02/24/2017 1900   KETONESUR NEGATIVE 02/24/2017 1900   PROTEINUR NEGATIVE 02/24/2017 1900   UROBILINOGEN 0.2 01/14/2012 1030   NITRITE NEGATIVE 02/24/2017 1900   LEUKOCYTESUR SMALL (A) 02/24/2017 1900   Sepsis Labs: @LABRCNTIP (procalcitonin:4,lacticidven:4)  ) Recent Results (from the past 240 hour(s))  Urine culture     Status: Abnormal   Collection Time: 02/24/17  8:11 PM  Result Value Ref Range Status   Specimen Description URINE, CLEAN CATCH  Final   Special Requests NONE  Final   Culture 30,000 COLONIES/mL ESCHERICHIA COLI (A)  Final   Report Status 02/27/2017 FINAL  Final   Organism ID, Bacteria ESCHERICHIA COLI (A)  Final      Susceptibility   Escherichia coli - MIC*    AMPICILLIN <=2 SENSITIVE Sensitive     CEFAZOLIN <=4 SENSITIVE Sensitive     CEFTRIAXONE <=1 SENSITIVE Sensitive     CIPROFLOXACIN <=0.25 SENSITIVE Sensitive     GENTAMICIN <=1 SENSITIVE Sensitive     IMIPENEM <=0.25 SENSITIVE Sensitive     NITROFURANTOIN <=16 SENSITIVE Sensitive     TRIMETH/SULFA <=20 SENSITIVE Sensitive     AMPICILLIN/SULBACTAM <=2 SENSITIVE Sensitive     PIP/TAZO <=4 SENSITIVE Sensitive     Extended ESBL NEGATIVE Sensitive     * 30,000 COLONIES/mL ESCHERICHIA COLI  Blood culture (routine x 2)     Status: None   Collection Time: 02/24/17  8:11 PM  Result Value Ref Range Status   Specimen Description BLOOD LEFT FOREARM  Final   Special Requests   Final    BOTTLES DRAWN AEROBIC AND ANAEROBIC Blood Culture adequate volume   Culture   Final    NO GROWTH 5 DAYS Performed at [facility], [address] [place], [place] [address]    Report Status 03/01/2017 FINAL  Final  Blood culture (routine x 2)     Status: None   Collection Time: 02/24/17  8:25 PM  Result  Value Ref Range Status   Specimen Description BLOOD LEFT HAND  Final   Special Requests   Final    BOTTLES DRAWN AEROBIC AND  ANAEROBIC Blood Culture results may not be optimal due to an inadequate volume of blood received in culture bottles   Culture   Final    NO GROWTH 5 DAYS Performed at [facility] [address] [place], [place] [address]    Report Status 03/01/2017 FINAL  Final  Culture, Blood     Status: None (Preliminary result)   Collection Time: 03/05/17 11:01 AM  Result Value Ref Range Status   BLOOD CULTURE, ROUTINE Preliminary report  Preliminary   Organism ID, Bacteria Comment  Preliminary    Comment: No growth detected at this time.  TECHNOLOGIST REVIEW     Status: None   Collection Time: 03/05/17 11:02 AM  Result Value Ref Range Status   Technologist Review Oc lymph, rare seg  Final  Culture, Blood     Status: None (Preliminary result)   Collection Time: 03/05/17 11:12 AM  Result Value Ref Range Status   BLOOD CULTURE, ROUTINE Preliminary report  Preliminary   Organism ID, Bacteria Comment  Preliminary    Comment: No growth detected at this time.      Radiology Studies: Dg Chest 2 View  Result Date: 03/05/2017 CLINICAL DATA:  Productive cough, fever. EXAM: CHEST  2 VIEW COMPARISON:  Radiographs of February 24, 2017. FINDINGS: The heart size and mediastinal contours are within normal limits. Atherosclerosis of thoracic aorta is noted. No pneumothorax or pleural effusion is noted. Right internal jugular Port-A-Cath is unchanged in position. Mild central pulmonary vascular congestion is noted. Mild right basilar opacity is noted concerning for possible atelectasis or infiltrate. The visualized skeletal structures are unremarkable. IMPRESSION: Aortic atherosclerosis. Mild right basilar subsegmental atelectasis or infiltrate is noted. Electronically Signed   By: Marijo Conception, M.D.   On: 03/05/2017 10:34     Scheduled Meds: . allopurinol  300 mg Oral Daily   . free water  50 mL Per Tube Q4H  . levothyroxine  50 mcg Oral QAC breakfast  . metoCLOPramide (REGLAN) injection  5 mg Intravenous TID AC & HS  . pantoprazole  40 mg Oral Daily  . saccharomyces boulardii  250 mg Oral BID  . sodium chloride flush  3 mL Intravenous Q12H   Continuous Infusions: . sodium chloride    . sodium chloride    . ceFEPime (MAXIPIME) IV Stopped (03/06/17 1815)  . feeding supplement (OSMOLITE 1.5 CAL) 1,000 mL (03/06/17 1714)     LOS: 1 day    Time spent: 30 min    Janece Canterbury, MD [facility] Pager [phone]  If 7PM-7AM, please contact night-coverage [email or web address] Password [email or web address] 03/06/2017, 7:10 PM

## 2017-03-07 LAB — CBC WITH DIFFERENTIAL/PLATELET
BASOS ABS: 0 10*3/uL (ref 0.0–0.1)
BASOS PCT: 0 %
Band Neutrophils: 0 %
EOS PCT: 0 %
Eosinophils Absolute: 0 10*3/uL (ref 0.0–0.7)
HEMATOCRIT: 22 % — AB (ref 36.0–46.0)
Hemoglobin: 7.3 g/dL — ABNORMAL LOW (ref 12.0–15.0)
LYMPHS PCT: 0 %
Lymphs Abs: 0 10*3/uL — ABNORMAL LOW (ref 0.7–4.0)
MCH: 28.7 pg (ref 26.0–34.0)
MCHC: 33.2 g/dL (ref 30.0–36.0)
MCV: 86.6 fL (ref 78.0–100.0)
MONO ABS: 0 10*3/uL — AB (ref 0.1–1.0)
Monocytes Relative: 0 %
NEUTROS PCT: 0 %
Platelets: 27 10*3/uL — CL (ref 150–400)
RBC: 2.54 MIL/uL — ABNORMAL LOW (ref 3.87–5.11)
RDW: 18.4 % — AB (ref 11.5–15.5)
WBC: 0.2 10*3/uL — CL (ref 4.0–10.5)
nRBC: 0 /100 WBC

## 2017-03-07 LAB — GLUCOSE, CAPILLARY
Glucose-Capillary: 168 mg/dL — ABNORMAL HIGH (ref 65–99)
Glucose-Capillary: 195 mg/dL — ABNORMAL HIGH (ref 65–99)
Glucose-Capillary: 209 mg/dL — ABNORMAL HIGH (ref 65–99)
Glucose-Capillary: 151 mg/dL — ABNORMAL HIGH (ref 65–99)
Glucose-Capillary: 168 mg/dL — ABNORMAL HIGH (ref 65–99)

## 2017-03-07 LAB — CREATININE, SERUM: Creatinine, Ser: 0.9 mg/dL (ref 0.44–1.00)

## 2017-03-07 LAB — C DIFFICILE QUICK SCREEN W PCR REFLEX
C DIFFICILE (CDIFF) TOXIN: NEGATIVE
C Diff antigen: NEGATIVE
C Diff interpretation: NOT DETECTED

## 2017-03-07 NOTE — Progress Notes (Signed)
CRITICAL VALUE ALERT  Critical Value: platelets 27  Date & Time Notied: 03/07/17 , 0745  Provider Notified: Dr Sheran Fava  Orders Received/Actions taken: yes

## 2017-03-07 NOTE — Progress Notes (Signed)
On call MD notified of nighttime CBG >160 (168). No orders received. Kristen Escobar

## 2017-03-07 NOTE — Progress Notes (Signed)
PROGRESS NOTE  Kristen Escobar  XHB:[id number] DOB: 1945/07/30 DOA: 03/05/2017 PCP: Marletta Lor, MD  Brief Narrative:   Kristen Escobar is a 71 y.o. 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  On the morning of admission she woke up with a fever of 101.3 F.  She called her cancer center providers and was asked to come in for checkup. She was found to have neutropenia. She was subsequently transferred to the hospital for admission.  She had some projective vomiting a few days prior to admission, better with reglan.  Cough has been stable and no other localizing symptoms.  She was initially started on vancomycin and Zosyn however she did not have any evidence of port infection or cellulitis so her vancomycin was discontinued.  Her Zosyn was changed to cefepime due to her pancytopenia.  She continues to have intermittent fevers and has developed watery diarrhea.  C. Diff PCR and GI pathogen panel have been ordered.  Her reglan has been stopped.  Anemia is resolving without blood transfusion and platelets appear to be nadiring in the high 20s.   Assessment & Plan:   Principal Problem:   Neutropenic fever ([place]) Active Problems:   Breast cancer of upper-outer quadrant of right female breast ([place])   Hypothyroidism   Diffuse large B cell lymphoma ([place])   Port-A-Cath in place   Tracheoesophageal fistula, acquired ([place])   Pulmonary embolism (H[facility])  Neutropenic fever/pancytopenia:  Having diarrhea now - Check C. Diff PCR and GI pathogen panel - continue cefepime -Patient received Neulasta -Her degree of anemia and thrombocytopenia are worse after this cycle of chemotherapy -Once platelets are > 30,000, will resume xarelto -Daily CBC with differential  -Blood cultures no growth to date -Urinalysis and urine  culture performed after starting abx and are negative  #2  Diffuse large B-cell lymphoma: Being treated with R CHOP by oncology.  Last cycle was on 11/9 which was her third cycle. -  Dr. Lindi Adie will reduce dose of next cycle of chemo  #3  Tracheoesophageal fistula: Seen by cardiothoracic surgery during previous hospitalization.  She will need to be referred to a tertiary center for further management.  This will be done once she is medically optimized.  #4  Recently diagnosed pulmonary embolism: This has been treated with anticoagulation.  She is feeling better from that standpoint.   -Holding Xarelto due to thrombocytopenia - Once platelet count is above 30,000, resume twice daily dosing to continue till 11/25 and then 20 mg once daily to start on 11/26.  #5  Hypothyroidism: Continue home medications  #6 Nutrition: As a result of the tracheoesophageal fistula patient is unable to take anything by mouth.  She has a PEG tube in place.  She does bolus feeding at home.   - Continuous feeding in the hospital. -  D/c Reglan  #7 transaminitis: Patient has had elevated LFTs in the past as well.  -  Repeat LFTs in AM  DVT prophylaxis: SCDs Code Status: Full code Family Communication: Patient and her husband Disposition Plan: Likely to home in a few days.  Awaiting recovery of neutropenia.  Working up diarrhea    Consultants:  Oncology, Dr. Lindi Adie  Procedures:  None  Antimicrobials:  Anti-infectives (From admission, onward)   Start     Dose/Rate Route Frequency Ordered Stop   03/06/17 1800  vancomycin (  VANCOCIN) IVPB 750 mg/150 ml premix  Status:  Discontinued     750 mg 150 mL/hr over 60 Minutes Intravenous Every 24 hours 03/05/17 1718 03/06/17 1208   03/06/17 1800  ceFEPIme (MAXIPIME) 2 g in dextrose 5 % 50 mL IVPB     2 g 100 mL/hr over 30 Minutes Intravenous Every 8 hours 03/06/17 1124     03/06/17 0830  ceFEPIme (MAXIPIME) 2 g in dextrose 5 % 50 mL IVPB     2 g 100 mL/hr  over 30 Minutes Intravenous STAT 03/06/17 0819 03/06/17 1111   03/05/17 1800  vancomycin (VANCOCIN) 2,000 mg in sodium chloride 0.9 % 500 mL IVPB     2,000 mg 250 mL/hr over 120 Minutes Intravenous  Once 03/05/17 1700 03/05/17 1958   03/05/17 1715  piperacillin-tazobactam (ZOSYN) IVPB 3.375 g  Status:  Discontinued     3.375 g 12.5 mL/hr over 240 Minutes Intravenous Every 8 hours 03/05/17 1702 03/06/17 0805   03/05/17 1645  piperacillin-tazobactam (ZOSYN) IVPB 3.375 g  Status:  Discontinued     3.375 g 100 mL/hr over 30 Minutes Intravenous  Once 03/05/17 1634 03/05/17 1635       Subjective:  Has had two episodes of watery diarrhea this morning.  Denies vomiting.  Having intermittent fevers.  Denies change in cough, chest pains, abdominal pains.    Objective: Vitals:   03/07/17 0130 03/07/17 0400 03/07/17 1000 03/07/17 1320  BP: (!) 125/45 (!) 138/49  (!) 145/79  Pulse: 94 97  (!) 118  Resp: 18 18  18   Temp: (!) 101.1 F (38.4 C) (!) 101.9 F (38.8 C) 98.5 F (36.9 C) 98.3 F (36.8 C)  TempSrc: Oral Oral Oral Oral  SpO2: 98% 97%  100%  Weight:  94.8 kg (208 lb 15.9 oz)    Height:        Intake/Output Summary (Last 24 hours) at 03/07/2017 1659 Last data filed at 03/07/2017 9381 Gross per 24 hour  Intake 939 ml  Output 650 ml  Net 289 ml   Filed Weights   03/05/17 1554 03/06/17 0452 03/07/17 0400  Weight: 94.8 kg (209 lb) 94.9 kg (209 lb 3.5 oz) 94.8 kg (208 lb 15.9 oz)    Examination:  General exam:  Adult female.  No acute distress.  HEENT:  NCAT, MMM Respiratory system: Clear to auscultation bilaterally Cardiovascular system:  Mildly tachycardic, normal S1/S2. No murmurs, rubs, gallops or clicks.  Warm extremities Gastrointestinal system: Normal active bowel sounds, soft, nondistended, nontender.  PEG tube in place with tube feeds running MSK:  Normal tone and bulk, no lower extremity edema Neuro:  Grossly intact   Data Reviewed: I have personally reviewed  following labs and imaging studies  CBC: Recent Labs  Lab 03/05/17 1102 03/06/17 0411 03/07/17 0500  WBC < 0.2* 0.1* 0.2*  HGB 8.3* 7.1* 7.3*  HCT 26.1* 21.0* 22.0*  MCV 87.9 86.8 86.6  PLT 64* 32* 27*   Basic Metabolic Panel: Recent Labs  Lab 03/05/17 1102 03/06/17 0411 03/07/17 0500  NA 136 136  --   K 4.0 3.8  --   CL  --  110  --   CO2 22 22  --   GLUCOSE 142* 212*  --   BUN 15.4 13  --   CREATININE 1.0 0.97 0.90  CALCIUM 8.3* 7.4*  --    GFR: Estimated Creatinine Clearance: 62.4 mL/min (by C-G formula based on SCr of 0.9 mg/dL). Liver Function Tests: Recent Labs  Lab  03/05/17 1102  AST 36*  ALT 309*  ALKPHOS 51  BILITOT 1.36*  PROT 6.0*  ALBUMIN 2.4*   No results for input(s): LIPASE, AMYLASE in the last 168 hours. No results for input(s): AMMONIA in the last 168 hours. Coagulation Profile: No results for input(s): INR, PROTIME in the last 168 hours. Cardiac Enzymes: No results for input(s): CKTOTAL, CKMB, CKMBINDEX, TROPONINI in the last 168 hours. BNP (last 3 results) No results for input(s): PROBNP in the last 8760 hours. HbA1C: No results for input(s): HGBA1C in the last 72 hours. CBG: Recent Labs  Lab 03/06/17 2353 03/07/17 0359 03/07/17 0746 03/07/17 1143 03/07/17 1640  GLUCAP 156* 168* 195* 209* 151*   Lipid Profile: No results for input(s): CHOL, HDL, LDLCALC, TRIG, CHOLHDL, LDLDIRECT in the last 72 hours. Thyroid Function Tests: No results for input(s): TSH, T4TOTAL, FREET4, T3FREE, THYROIDAB in the last 72 hours. Anemia Panel: No results for input(s): VITAMINB12, FOLATE, FERRITIN, TIBC, IRON, RETICCTPCT in the last 72 hours. Urine analysis:    Component Value Date/Time   COLORURINE YELLOW 03/06/2017 2120   APPEARANCEUR CLEAR 03/06/2017 2120   LABSPEC 1.018 03/06/2017 2120   PHURINE 6.0 03/06/2017 2120   GLUCOSEU NEGATIVE 03/06/2017 2120   GLUCOSEU NEGATIVE 07/24/2009 0750   HGBUR NEGATIVE 03/06/2017 2120   BILIRUBINUR  NEGATIVE 03/06/2017 2120   KETONESUR NEGATIVE 03/06/2017 2120   PROTEINUR NEGATIVE 03/06/2017 2120   UROBILINOGEN 0.2 01/14/2012 1030   NITRITE NEGATIVE 03/06/2017 2120   LEUKOCYTESUR NEGATIVE 03/06/2017 2120   Sepsis Labs: @LABRCNTIP (procalcitonin:4,lacticidven:4)  ) Recent Results (from the past 240 hour(s))  Culture, Blood     Status: None (Preliminary result)   Collection Time: 03/05/17 11:01 AM  Result Value Ref Range Status   BLOOD CULTURE, ROUTINE Preliminary report  Preliminary   Organism ID, Bacteria Comment  Preliminary    Comment: No growth detected at this time.  TECHNOLOGIST REVIEW     Status: None   Collection Time: 03/05/17 11:02 AM  Result Value Ref Range Status   Technologist Review Oc lymph, rare seg  Final  Culture, Blood     Status: None (Preliminary result)   Collection Time: 03/05/17 11:12 AM  Result Value Ref Range Status   BLOOD CULTURE, ROUTINE Preliminary report  Preliminary   Organism ID, Bacteria Comment  Preliminary    Comment: No growth detected at this time.      Radiology Studies: No results found.   Scheduled Meds: . allopurinol  300 mg Oral Daily  . free water  50 mL Per Tube Q4H  . levothyroxine  50 mcg Oral QAC breakfast  . pantoprazole  40 mg Oral Daily  . saccharomyces boulardii  250 mg Oral BID  . sodium chloride flush  3 mL Intravenous Q12H   Continuous Infusions: . sodium chloride    . sodium chloride    . ceFEPime (MAXIPIME) IV Stopped (03/07/17 1058)  . feeding supplement (OSMOLITE 1.5 CAL) 1,000 mL (03/06/17 1714)     LOS: 2 days    Time spent: 30 min    Janece Canterbury, MD [facility] Pager [phone]  If 7PM-7AM, please contact night-coverage [email or web address] Password [email or web address] 03/07/2017, 4:59 PM

## 2017-03-08 ENCOUNTER — Inpatient Hospital Stay (HOSPITAL_COMMUNITY): Payer: Medicare Other

## 2017-03-08 ENCOUNTER — Other Ambulatory Visit: Payer: Self-pay

## 2017-03-08 LAB — COMPREHENSIVE METABOLIC PANEL
ALBUMIN: 1.8 g/dL — AB (ref 3.5–5.0)
ALT: 78 U/L — ABNORMAL HIGH (ref 14–54)
AST: 20 U/L (ref 15–41)
Alkaline Phosphatase: 31 U/L — ABNORMAL LOW (ref 38–126)
Anion gap: 6 (ref 5–15)
BUN: 15 mg/dL (ref 6–20)
CHLORIDE: 112 mmol/L — AB (ref 101–111)
CO2: 22 mmol/L (ref 22–32)
Calcium: 7.5 mg/dL — ABNORMAL LOW (ref 8.9–10.3)
Creatinine, Ser: 0.72 mg/dL (ref 0.44–1.00)
GFR calc Af Amer: >60 mL/min (ref >60)
GFR calc non Af Amer: >60 mL/min (ref >60)
GLUCOSE: 121 mg/dL — AB (ref 65–99)
POTASSIUM: 3.1 mmol/L — AB (ref 3.5–5.1)
SODIUM: 140 mmol/L (ref 135–145)
Total Bilirubin: 0.4 mg/dL (ref 0.3–1.2)
Total Protein: 5 g/dL — ABNORMAL LOW (ref 6.5–8.1)

## 2017-03-08 LAB — CBC WITH DIFFERENTIAL/PLATELET
Basophils Absolute: 0 10*3/uL (ref 0.0–0.1)
Basophils Relative: 4 %
EOS ABS: 0 10*3/uL (ref 0.0–0.7)
EOS PCT: 3 %
HCT: 20.1 % — ABNORMAL LOW (ref 36.0–46.0)
Hemoglobin: 6.5 g/dL — CL (ref 12.0–15.0)
Lymphocytes Relative: 44 %
Lymphs Abs: 0.2 10*3/uL — ABNORMAL LOW (ref 0.7–4.0)
MCH: 27.7 pg (ref 26.0–34.0)
MCHC: 32.3 g/dL (ref 30.0–36.0)
MCV: 85.5 fL (ref 78.0–100.0)
MONO ABS: 0.1 10*3/uL (ref 0.1–1.0)
Monocytes Relative: 10 %
NEUTROS PCT: 39 %
Neutro Abs: 0.2 10*3/uL — ABNORMAL LOW (ref 1.7–7.7)
PLATELETS: 26 10*3/uL — AB (ref 150–400)
RBC: 2.35 MIL/uL — AB (ref 3.87–5.11)
RDW: 18.3 % — AB (ref 11.5–15.5)
WBC: 0.5 10*3/uL — AB (ref 4.0–10.5)

## 2017-03-08 LAB — GLUCOSE, CAPILLARY
GLUCOSE-CAPILLARY: 162 mg/dL — AB (ref 65–99)
Glucose-Capillary: 162 mg/dL — ABNORMAL HIGH (ref 65–99)
Glucose-Capillary: 144 mg/dL — ABNORMAL HIGH (ref 65–99)
Glucose-Capillary: 156 mg/dL — ABNORMAL HIGH (ref 65–99)
Glucose-Capillary: 152 mg/dL — ABNORMAL HIGH (ref 65–99)

## 2017-03-08 LAB — URINE CULTURE: Culture: NO GROWTH

## 2017-03-08 LAB — GASTROINTESTINAL PANEL BY PCR, STOOL (REPLACES STOOL CULTURE)

## 2017-03-08 LAB — MAGNESIUM: Magnesium: 1.9 mg/dL (ref 1.7–2.4)

## 2017-03-08 MED ORDER — SODIUM CHLORIDE 0.9 % IV SOLN
Freq: Once | INTRAVENOUS | Status: AC
  Administered 2017-03-08: 09:00:00 via INTRAVENOUS

## 2017-03-08 MED ORDER — ORAL CARE MOUTH RINSE
15.0000 mL | Freq: Two times a day (BID) | OROMUCOSAL | Status: DC
  Administered 2017-03-08 (×2): 15 mL via OROMUCOSAL

## 2017-03-08 MED ORDER — LOPERAMIDE HCL 2 MG PO CAPS
2.0000 mg | ORAL_CAPSULE | ORAL | Status: DC | PRN
  Administered 2017-03-08: 2 mg via ORAL
  Filled 2017-03-08: qty 1

## 2017-03-08 MED ORDER — MAGNESIUM SULFATE 2 GM/50ML IV SOLN
2.0000 g | Freq: Once | INTRAVENOUS | Status: AC
  Administered 2017-03-08: 2 g via INTRAVENOUS
  Filled 2017-03-08: qty 50

## 2017-03-08 MED ORDER — SODIUM CHLORIDE 0.9 % IV SOLN: Freq: Once | INTRAVENOUS | Status: DC

## 2017-03-08 MED ORDER — POTASSIUM CHLORIDE 20 MEQ/15ML (10%) PO SOLN
40.0000 meq | ORAL | Status: AC
  Administered 2017-03-08 (×2): 40 meq
  Filled 2017-03-08 (×2): qty 30

## 2017-03-08 NOTE — Progress Notes (Signed)
Ok to transfuse blood with temp 99.4 per Dr. Sheran Fava

## 2017-03-08 NOTE — Progress Notes (Signed)
CRITICAL VALUE ALERT  Critical Value:  HGB 6.5  Date & Time Notied:  03/08/17 at 0530  Provider Notified: Kennon Holter  Orders Received/Actions taken: MD orders received to transfuse.

## 2017-03-08 NOTE — Progress Notes (Addendum)
PROGRESS NOTE  Kristen Escobar  GGE:[id number] DOB: [birth year] DOA: 03/05/2017 PCP: Marletta Lor, MD  Brief Narrative:   Kristen Escobar is a 71 y.o. 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  On the morning of admission she woke up with a fever of 101.3 F.  She called her cancer center providers and was asked to come in for checkup. She was found to have neutropenia. She was subsequently transferred to the hospital for admission.  She had some projective vomiting a few days prior to admission, better with reglan.  Cough has been stable and no other localizing symptoms.  She was initially started on vancomycin and Zosyn however she did not have any evidence of port infection or cellulitis so her vancomycin was discontinued.  Her Zosyn was changed to cefepime due to her pancytopenia.  She continues to have intermittent fevers and has developed watery diarrhea.  C. Diff PCR and GI pathogen panel are negative.  Start imodium.  Her reglan has been stopped.  Hemoglobin trended down to 6.5g/dl so received blood transfusion on 11/18.  Assessment & Plan:   Principal Problem:   Neutropenic fever ([place]) Active Problems:   Breast cancer of upper-outer quadrant of right female breast ([place])   Hypothyroidism   Diffuse large B cell lymphoma ([place])   Port-A-Cath in place   Tracheoesophageal fistula, acquired ([place])   Pulmonary embolism ([facility])  Neutropenic fever/pancytopenia:  Having diarrhea now, also likely related to her chemotherapy.  Intermittent fevers persist.   - C. Diff PCR negative -  GI pathogen panel negative -  D/c rectal tube:  No rectal procedures while neutropenic -  Start imodium - continue cefepime -Patient received Neulasta - hemoglobin down to 6.5g/dl -  Transfuse 1 unit PRBC -Once platelets are >  30,000, will resume xarelto -Daily CBC with differential  -Blood cultures no growth to date -Urinalysis and urine culture performed after starting abx and are negative  #2  Diffuse large B-cell lymphoma: Being treated with R [facility] by oncology.  Last cycle was on 11/9 which was her third cycle. -  Dr. Lindi Adie will reduce dose of next cycle of chemo  #3  Tracheoesophageal fistula: Seen by cardiothoracic surgery during previous hospitalization.  She will need to be referred to a tertiary center for further management.  This will be done once she is medically optimized.  #4  Recently diagnosed pulmonary embolism: This has been treated with anticoagulation.  She is feeling better from that standpoint.   -Holding Xarelto due to thrombocytopenia - Once platelet count is above 30,000, resume twice daily dosing to continue till 11/25 and then 20 mg once daily to start on 11/26.  #5  Hypothyroidism: Continue home medications  #6 Nutrition: As a result of the tracheoesophageal fistula patient is unable to take anything by mouth.  She has a PEG tube in place.  She does bolus feeding at home.   - Continuous feeding in the hospital.  #7 transaminitis due to chemotherapy: LFTs have improved.   DVT prophylaxis: SCDs Code Status: Full code Family Communication: Patient and her husband Disposition Plan: Likely to home in a few days.  Awaiting recovery of neutropenia.  Working up diarrhea    Consultants:  Oncology, Dr. Lindi Adie  Procedures:  None  Antimicrobials:  Anti-infectives (From admission, onward)   Start     Dose/Rate Route  Frequency Ordered Stop   03/06/17 1800  vancomycin (VANCOCIN) IVPB 750 mg/150 ml premix  Status:  Discontinued     750 mg 150 mL/hr over 60 Minutes Intravenous Every 24 hours 03/05/17 1718 03/06/17 1208   03/06/17 1800  ceFEPIme (MAXIPIME) 2 g in dextrose 5 % 50 mL IVPB     2 g 100 mL/hr over 30 Minutes Intravenous Every 8 hours 03/06/17 1124     03/06/17 0830   ceFEPIme (MAXIPIME) 2 g in dextrose 5 % 50 mL IVPB     2 g 100 mL/hr over 30 Minutes Intravenous STAT 03/06/17 0819 03/06/17 1111   03/05/17 1800  vancomycin (VANCOCIN) 2,000 mg in sodium chloride 0.9 % 500 mL IVPB     2,000 mg 250 mL/hr over 120 Minutes Intravenous  Once 03/05/17 1700 03/05/17 1958   03/05/17 1715  piperacillin-tazobactam (ZOSYN) IVPB 3.375 g  Status:  Discontinued     3.375 g 12.5 mL/hr over 240 Minutes Intravenous Every 8 hours 03/05/17 1702 03/06/17 0805   03/05/17 1645  piperacillin-tazobactam (ZOSYN) IVPB 3.375 g  Status:  Discontinued     3.375 g 100 mL/hr over 30 Minutes Intravenous  Once 03/05/17 1634 03/05/17 1635       Subjective:  Was incontinent of copious watery stool last night.  Had rectal tube placed last night for comfort.  Denies vomiting or nausea.  Denies worsening cough or SOB.    Objective: Vitals:   03/08/17 0835 03/08/17 0920 03/08/17 1211 03/08/17 1236  BP: (!) 130/47 (!) 126/54 (!) 131/58 (!) 148/56  Pulse: 98 98 92 93  Resp: (!) 24 (!) 24 20 20   Temp: 99.4 F (37.4 C) 99.4 F (37.4 C) 99.3 F (37.4 C) 98 F (36.7 C)  TempSrc: Oral Oral Oral Oral  SpO2: 98% 98% 98% 99%  Weight:    100.5 kg (221 lb 9 oz)  Height:        Intake/Output Summary (Last 24 hours) at 03/08/2017 1417 Last data filed at 03/08/2017 1216 Gross per 24 hour  Intake 2073 ml  Output 750 ml  Net 1323 ml   Filed Weights   03/06/17 0452 03/07/17 0400 03/08/17 1236  Weight: 94.9 kg (209 lb 3.5 oz) 94.8 kg (208 lb 15.9 oz) 100.5 kg (221 lb 9 oz)    Examination:  General exam:  Adult female.  No acute distress.  HEENT:  NCAT, MMM Respiratory system: Clear to auscultation bilaterally Cardiovascular system: Regular rate and rhythm, normal S1/S2. No murmurs, rubs, gallops or clicks.  Warm extremities Gastrointestinal system: Hyperactive bowel sounds, soft, nondistended, nontender.  I tightened her bumper slightly today from 10cm to 8.5cm.   MSK:  Normal tone  and bulk, trace lower extremity edema with TED hose in place Neuro:  Grossly intact  Data Reviewed: I have personally reviewed following labs and imaging studies  CBC: Recent Labs  Lab 03/05/17 1102 03/06/17 0411 03/07/17 0500 03/08/17 0451  WBC < 0.2* 0.1* 0.2* 0.5*  NEUTROABS  --   --   --  0.2*  HGB 8.3* 7.1* 7.3* 6.5*  HCT 26.1* 21.0* 22.0* 20.1*  MCV 87.9 86.8 86.6 85.5  PLT 64* 32* 27* 26*   Basic Metabolic Panel: Recent Labs  Lab 03/05/17 1102 03/06/17 0411 03/07/17 0500 03/08/17 0451  NA 136 136  --  140  K 4.0 3.8  --  3.1*  CL  --  110  --  112*  CO2 22 22  --  22  GLUCOSE 142*  212*  --  121*  BUN 15.4 13  --  15  CREATININE 1.0 0.97 0.90 0.72  CALCIUM 8.3* 7.4*  --  7.5*  MG  --   --   --  1.9   GFR: Estimated Creatinine Clearance: 72.6 mL/min (by C-G formula based on SCr of 0.72 mg/dL). Liver Function Tests: Recent Labs  Lab 03/05/17 1102 03/08/17 0451  AST 36* 20  ALT 309* 78*  ALKPHOS 51 31*  BILITOT 1.36* 0.4  PROT 6.0* 5.0*  ALBUMIN 2.4* 1.8*   No results for input(s): LIPASE, AMYLASE in the last 168 hours. No results for input(s): AMMONIA in the last 168 hours. Coagulation Profile: No results for input(s): INR, PROTIME in the last 168 hours. Cardiac Enzymes: No results for input(s): CKTOTAL, CKMB, CKMBINDEX, TROPONINI in the last 168 hours. BNP (last 3 results) No results for input(s): PROBNP in the last 8760 hours. HbA1C: No results for input(s): HGBA1C in the last 72 hours. CBG: Recent Labs  Lab 03/07/17 2023 03/08/17 0137 03/08/17 0552 03/08/17 0719 03/08/17 1130  GLUCAP 168* 162* 144* 162* 156*   Lipid Profile: No results for input(s): CHOL, HDL, LDLCALC, TRIG, CHOLHDL, LDLDIRECT in the last 72 hours. Thyroid Function Tests: No results for input(s): TSH, T4TOTAL, FREET4, T3FREE, THYROIDAB in the last 72 hours. Anemia Panel: No results for input(s): VITAMINB12, FOLATE, FERRITIN, TIBC, IRON, RETICCTPCT in the last 72  hours. Urine analysis:    Component Value Date/Time   COLORURINE YELLOW 03/06/2017 2120   APPEARANCEUR CLEAR 03/06/2017 2120   LABSPEC 1.018 03/06/2017 2120   PHURINE 6.0 03/06/2017 2120   GLUCOSEU NEGATIVE 03/06/2017 2120   GLUCOSEU NEGATIVE 07/24/2009 0750   HGBUR NEGATIVE 03/06/2017 2120   BILIRUBINUR NEGATIVE 03/06/2017 2120   KETONESUR NEGATIVE 03/06/2017 2120   PROTEINUR NEGATIVE 03/06/2017 2120   UROBILINOGEN 0.2 01/14/2012 1030   NITRITE NEGATIVE 03/06/2017 2120   LEUKOCYTESUR NEGATIVE 03/06/2017 2120   Sepsis Labs: @LABRCNTIP (procalcitonin:4,lacticidven:4)  ) Recent Results (from the past 240 hour(s))  Culture, Blood     Status: None (Preliminary result)   Collection Time: 03/05/17 11:01 AM  Result Value Ref Range Status   BLOOD CULTURE, ROUTINE Preliminary report  Preliminary   Organism ID, Bacteria Comment  Preliminary    Comment: No growth in 36 - 48 hours.  TECHNOLOGIST REVIEW     Status: None   Collection Time: 03/05/17 11:02 AM  Result Value Ref Range Status   Technologist Review Oc lymph, rare seg  Final  Culture, Blood     Status: None (Preliminary result)   Collection Time: 03/05/17 11:12 AM  Result Value Ref Range Status   BLOOD CULTURE, ROUTINE Preliminary report  Preliminary   Organism ID, Bacteria Comment  Preliminary    Comment: No growth in 36 - 48 hours.  Culture, blood (Routine X 2) w Reflex to ID Panel     Status: None (Preliminary result)   Collection Time: 03/06/17  4:11 AM  Result Value Ref Range Status   Specimen Description BLOOD LEFT ANTECUBITAL  Final   Special Requests   Final    BOTTLES DRAWN AEROBIC ONLY Blood Culture adequate volume   Culture   Final    NO GROWTH 2 DAYS Performed at [facility], [address], [place], [place] [address]    Report Status PENDING  Incomplete  Culture, blood (Routine X 2) w Reflex to ID Panel     Status: None (Preliminary result)   Collection Time: 03/06/17  4:11 AM  Result Value Ref Range  Status   Specimen Description BLOOD RIGHT ARM  Final   Special Requests   Final    BOTTLES DRAWN AEROBIC ONLY Blood Culture adequate volume   Culture   Final    NO GROWTH 2 DAYS Performed at [facility], [address] [place], [place] [address]    Report Status PENDING  Incomplete  Culture, Urine     Status: None   Collection Time: 03/06/17  9:20 PM  Result Value Ref Range Status   Specimen Description URINE, RANDOM  Final   Special Requests NONE  Final   Culture   Final    NO GROWTH Performed at [facility], [place] [address], [place], [place] [address]    Report Status 03/08/2017 FINAL  Final  C difficile quick scan w PCR reflex     Status: None   Collection Time: 03/07/17  1:44 PM  Result Value Ref Range Status   C Diff antigen NEGATIVE NEGATIVE Final   C Diff toxin NEGATIVE NEGATIVE Final   C Diff interpretation No C. difficile detected.  Final  Gastrointestinal Panel by PCR , Stool     Status: None   Collection Time: 03/07/17  1:44 PM  Result Value Ref Range Status   Campylobacter species NOT DETECTED NOT DETECTED Final   Plesimonas shigelloides NOT DETECTED NOT DETECTED Final   Salmonella species NOT DETECTED NOT DETECTED Final   Yersinia enterocolitica NOT DETECTED NOT DETECTED Final   Vibrio species NOT DETECTED NOT DETECTED Final   Vibrio cholerae NOT DETECTED NOT DETECTED Final   Enteroaggregative E coli (EAEC) NOT DETECTED NOT DETECTED Final   Enteropathogenic E coli (EPEC) NOT DETECTED NOT DETECTED Final   Enterotoxigenic E coli (ETEC) NOT DETECTED NOT DETECTED Final   Shiga like toxin producing E coli (STEC) NOT DETECTED NOT DETECTED Final   Shigella/Enteroinvasive E coli (EIEC) NOT DETECTED NOT DETECTED Final   Cryptosporidium NOT DETECTED NOT DETECTED Final   Cyclospora cayetanensis NOT DETECTED NOT DETECTED Final   Entamoeba histolytica NOT DETECTED NOT DETECTED Final   Giardia lamblia NOT DETECTED NOT DETECTED Final   Adenovirus F40/41 NOT  DETECTED NOT DETECTED Final   Astrovirus NOT DETECTED NOT DETECTED Final   Norovirus GI/GII NOT DETECTED NOT DETECTED Final   Rotavirus A NOT DETECTED NOT DETECTED Final   Sapovirus (I, II, IV, and V) NOT DETECTED NOT DETECTED Final      Radiology Studies: Dg Abd Portable 1v  Result Date: 03/08/2017 CLINICAL DATA:  71 year old female with G-tube placement. EXAM: PORTABLE ABDOMEN - 1 VIEW COMPARISON:  Abdominal radiograph dated 02/27/2017 FINDINGS: A percutaneous gastrostomy is noted with tip in the left upper abdomen. There is no bowel dilatation or evidence of obstruction. Multiple gallstones are noted. There is degenerative changes of the spine. No acute osseous pathology. IMPRESSION: 1. Percutaneous gastrostomy with tip in the left upper abdomen. 2. No evidence of bowel obstruction. 3. Cholelithiasis. Electronically Signed   By: Anner Crete M.D.   On: 03/08/2017 03:38     Scheduled Meds: . allopurinol  300 mg Oral Daily  . free water  50 mL Per Tube Q4H  . levothyroxine  50 mcg Oral QAC breakfast  . mouth rinse  15 mL Mouth Rinse BID  . pantoprazole  40 mg Oral Daily  . saccharomyces boulardii  250 mg Oral BID  . sodium chloride flush  3 mL Intravenous Q12H   Continuous Infusions: . sodium chloride    .  sodium chloride    . sodium chloride Stopped (03/08/17 7953)  . ceFEPime (MAXIPIME) IV Stopped (03/08/17 1251)  . feeding supplement (OSMOLITE 1.5 CAL) 1,000 mL (03/08/17 0635)     LOS: 3 days    Time spent: 30 min    Janece Canterbury, MD [facility] Pager [phone]  If 7PM-7AM, please contact night-coverage [email or web address] Password [email or web address] 03/08/2017, 2:17 PM

## 2017-03-09 ENCOUNTER — Encounter (HOSPITAL_COMMUNITY): Payer: Self-pay | Admitting: Diagnostic Radiology

## 2017-03-09 ENCOUNTER — Inpatient Hospital Stay (HOSPITAL_COMMUNITY): Payer: Medicare Other

## 2017-03-09 DIAGNOSIS — Z931 Gastrostomy status: Secondary | ICD-10-CM

## 2017-03-09 DIAGNOSIS — C50411 Malignant neoplasm of upper-outer quadrant of right female breast: Secondary | ICD-10-CM

## 2017-03-09 DIAGNOSIS — Z170 Estrogen receptor positive status [ER+]: Secondary | ICD-10-CM

## 2017-03-09 DIAGNOSIS — K9423 Gastrostomy malfunction: Secondary | ICD-10-CM

## 2017-03-09 HISTORY — PX: IR REPLC GASTRO/COLONIC TUBE PERCUT W/FLUORO: IMG2333

## 2017-03-09 LAB — COMPREHENSIVE METABOLIC PANEL
ALT: 67 U/L — ABNORMAL HIGH (ref 14–54)
AST: 23 U/L (ref 15–41)
Albumin: 2 g/dL — ABNORMAL LOW (ref 3.5–5.0)
Alkaline Phosphatase: 34 U/L — ABNORMAL LOW (ref 38–126)
Anion gap: 4 — ABNORMAL LOW (ref 5–15)
BILIRUBIN TOTAL: 0.5 mg/dL (ref 0.3–1.2)
BUN: 16 mg/dL (ref 6–20)
CHLORIDE: 113 mmol/L — AB (ref 101–111)
CO2: 23 mmol/L (ref 22–32)
Calcium: 7.9 mg/dL — ABNORMAL LOW (ref 8.9–10.3)
Creatinine, Ser: 0.72 mg/dL (ref 0.44–1.00)
Glucose, Bld: 173 mg/dL — ABNORMAL HIGH (ref 65–99)
POTASSIUM: 4.1 mmol/L (ref 3.5–5.1)
Sodium: 140 mmol/L (ref 135–145)
TOTAL PROTEIN: 5.5 g/dL — AB (ref 6.5–8.1)

## 2017-03-09 LAB — CBC WITH DIFFERENTIAL/PLATELET
BASOS PCT: 0 %
Basophils Absolute: 0 10*3/uL (ref 0.0–0.1)
EOS PCT: 1 %
Eosinophils Absolute: 0 10*3/uL (ref 0.0–0.7)
HEMATOCRIT: 25.3 % — AB (ref 36.0–46.0)
Hemoglobin: 8.4 g/dL — ABNORMAL LOW (ref 12.0–15.0)
LYMPHS PCT: 14 %
Lymphs Abs: 0.3 10*3/uL — ABNORMAL LOW (ref 0.7–4.0)
MCH: 29 pg (ref 26.0–34.0)
MCHC: 33.2 g/dL (ref 30.0–36.0)
MCV: 87.2 fL (ref 78.0–100.0)
MONOS PCT: 11 %
Monocytes Absolute: 0.3 10*3/uL (ref 0.1–1.0)
NEUTROS PCT: 74 %
Neutro Abs: 1.7 10*3/uL (ref 1.7–7.7)
PLATELETS: 58 10*3/uL — AB (ref 150–400)
RBC: 2.9 MIL/uL — AB (ref 3.87–5.11)
RDW: 17.9 % — AB (ref 11.5–15.5)
WBC: 2.3 10*3/uL — AB (ref 4.0–10.5)

## 2017-03-09 LAB — GLUCOSE, CAPILLARY
GLUCOSE-CAPILLARY: 134 mg/dL — AB (ref 65–99)
GLUCOSE-CAPILLARY: 160 mg/dL — AB (ref 65–99)
Glucose-Capillary: 144 mg/dL — ABNORMAL HIGH (ref 65–99)
Glucose-Capillary: 158 mg/dL — ABNORMAL HIGH (ref 65–99)
Glucose-Capillary: 120 mg/dL — ABNORMAL HIGH (ref 65–99)

## 2017-03-09 LAB — TYPE AND SCREEN
ABO/RH(D): A POS
ANTIBODY SCREEN: NEGATIVE
Unit division: 0

## 2017-03-09 LAB — BPAM RBC
BLOOD PRODUCT EXPIRATION DATE: 201812032359
ISSUE DATE / TIME: 201811180840
UNIT TYPE AND RH: 6200

## 2017-03-09 LAB — HEMOGLOBIN AND HEMATOCRIT, BLOOD
HEMATOCRIT: 26 % — AB (ref 36.0–46.0)
HEMOGLOBIN: 8.7 g/dL — AB (ref 12.0–15.0)

## 2017-03-09 LAB — MAGNESIUM: MAGNESIUM: 2.4 mg/dL (ref 1.7–2.4)

## 2017-03-09 MED ORDER — IOPAMIDOL (ISOVUE-300) INJECTION 61%
10.0000 mL | Freq: Once | INTRAVENOUS | Status: AC | PRN
  Administered 2017-03-09: 30 mL

## 2017-03-09 MED ORDER — FENTANYL CITRATE (PF) 100 MCG/2ML IJ SOLN
25.0000 ug | Freq: Once | INTRAMUSCULAR | Status: AC
  Administered 2017-03-09: 25 ug via INTRAVENOUS

## 2017-03-09 MED ORDER — RIVAROXABAN 20 MG PO TABS: 20.0000 mg | ORAL_TABLET | Freq: Every day | ORAL | 0 refills | Status: DC

## 2017-03-09 MED ORDER — FENTANYL CITRATE (PF) 100 MCG/2ML IJ SOLN
INTRAMUSCULAR | Status: AC
  Administered 2017-03-09: 25 ug via INTRAVENOUS
  Filled 2017-03-09: qty 2

## 2017-03-09 MED ORDER — IOPAMIDOL (ISOVUE-300) INJECTION 61%
INTRAVENOUS | Status: AC
  Administered 2017-03-09: 30 mL
  Filled 2017-03-09: qty 50

## 2017-03-09 MED ORDER — HEPARIN SOD (PORK) LOCK FLUSH 100 UNIT/ML IV SOLN
500.0000 [IU] | INTRAVENOUS | Status: AC | PRN
  Administered 2017-03-09: 500 [IU]
  Filled 2017-03-09: qty 5

## 2017-03-09 MED ORDER — LIDOCAINE VISCOUS 2 % MT SOLN
OROMUCOSAL | Status: AC
  Filled 2017-03-09: qty 15

## 2017-03-09 NOTE — Progress Notes (Signed)
PT Cancellation Note  Patient Details Name: Nature Kueker MRN: [id number] DOB: [birth year]   Cancelled Treatment:    Reason Eval/Treat Not Completed: Attempted PT eval. Pt currently waiting on IR to assess feeding tube (leaking). Will check back another time.    Weston Anna, MPT Pager: [phone]

## 2017-03-09 NOTE — Care Management Important Message (Signed)
Important Message  Patient Details  Name: Kristen Escobar MRN: [id number] Date of Birth: March 15, 1946   Medicare Important Message Given:  Yes    Kerin Salen 03/09/2017, 11:26 AMImportant Message  Patient Details  Name: Kristen Escobar MRN: [id number] Date of Birth: [birth year]   Medicare Important Message Given:  Yes    Kerin Salen 03/09/2017, 11:26 AM

## 2017-03-09 NOTE — Progress Notes (Signed)
HEMATOLOGY-ONCOLOGY PROGRESS NOTE  SUBJECTIVE: Fevers have resolved. Diarrhea has improved since she stopped Reglan. Patient and her husband were very concerned about the leakage around the PEG tube site that is wetting her clothes.  OBJECTIVE: REVIEW OF SYSTEMS:   Constitutional: Denies fevers, chills or abnormal weight loss Eyes: Denies blurriness of vision Ears, nose, mouth, throat, and face: Nothing by mouth because of the tracheoesophageal fistula, she is having cough with phlegm. Respiratory: Denies cough, dyspnea or wheezes Cardiovascular: Denies palpitation, chest discomfort Gastrointestinal: Leakage around the PEG tube site Skin: Denies abnormal skin rashes Lymphatics: Denies new lymphadenopathy or easy bruising Neurological:Denies numbness, tingling or new weaknesses Behavioral/Psych: Mood is stable, no new changes  Extremities: No lower extremity edema All other systems were reviewed with the patient and are negative.  I have reviewed the past medical history, past surgical history, social history and family history with the patient and they are unchanged from previous note.   PHYSICAL EXAMINATION: ECOG PERFORMANCE STATUS: 2 - Symptomatic, <50% confined to bed  Vitals:   03/08/17 2033 03/09/17 0506  BP: 128/74 (!) 146/68  Pulse: 96 (!) 101  Resp: 18 18  Temp: 98.4 F (36.9 C) 98.3 F (36.8 C)  SpO2: 98% 99%   Filed Weights   03/08/17 1236 03/09/17 0032 03/09/17 0500  Weight: 221 lb 9 oz (100.5 kg) 224 lb (101.6 kg) 224 lb (101.6 kg)    GENERAL:alert, no distress and comfortable SKIN: skin color, texture, turgor are normal, no rashes or significant lesions EYES: normal, Conjunctiva are pink and non-injected, sclera clear OROPHARYNX:no exudate, no erythema and lips, buccal mucosa, and tongue normal  NECK: supple, thyroid normal size, non-tender, without nodularity LYMPH:  no palpable lymphadenopathy in the cervical, axillary or inguinal LUNGS: clear to  auscultation and percussion with normal breathing effort HEART: regular rate & rhythm and no murmurs and no lower extremity edema ABDOMEN: There is definitely small amount of discharge around the PEG tube site that is causing the wetness of her gown. Musculoskeletal:no cyanosis of digits and no clubbing  NEURO: alert & oriented x 3 with fluent speech, no focal motor/sensory deficits  LABORATORY DATA:  I have reviewed the data as listed CMP Latest Ref Rng & Units 03/09/2017 03/08/2017 03/07/2017  Glucose 65 - 99 mg/dL 173(H) 121(H) -  BUN 6 - 20 mg/dL 16 15 -  Creatinine 0.44 - 1.00 mg/dL 0.72 0.72 0.90  Sodium 135 - 145 mmol/L 140 140 -  Potassium 3.5 - 5.1 mmol/L 4.1 3.1(L) -  Chloride 101 - 111 mmol/L 113(H) 112(H) -  CO2 22 - 32 mmol/L 23 22 -  Calcium 8.9 - 10.3 mg/dL 7.9(L) 7.5(L) -  Total Protein 6.5 - 8.1 g/dL 5.5(L) 5.0(L) -  Total Bilirubin 0.3 - 1.2 mg/dL 0.5 0.4 -  Alkaline Phos 38 - 126 U/L 34(L) 31(L) -  AST 15 - 41 U/L 23 20 -  ALT 14 - 54 U/L 67(H) 78(H) -    Lab Results  Component Value Date   WBC 2.3 (L) 03/09/2017   HGB 8.4 (L) 03/09/2017   HCT 25.3 (L) 03/09/2017   MCV 87.2 03/09/2017   PLT 58 (L) 03/09/2017   NEUTROABS 1.7 03/09/2017    ASSESSMENT AND PLAN: 1.  PEG tube site issues: Because of her previous experience with the PEG tube coming off her stomach, she is extremely concerned.  Recent imaging did not reveal any problems with the PEG tube.  However prior to discharge they would like to speak to  the radiologist and make sure that there is no concern. 2. neutropenic fever: Her ANC has improved to 1.7.  She is no longer neutropenic.  We can stop her antibiotics.  All the cultures have been negative so she does not need to go home on any antibiotics. 3.  Severe thrombocytopenia: Also improved. 4.  Pulmonary embolism: Patient can resume Xarelto.  I recommended that she can go home with once a day Xarelto instead of loading her up again. 5.  Severe anemia:  Status post blood transfusion My plan is to obtain a CT scan prior to her visit to see me on on 03/20/2017 From oncology standpoint patient is fine to go home today.

## 2017-03-09 NOTE — Progress Notes (Signed)
Port flushed with normal saline 10 ml and 500 units of heparin and then discontinued. G-port was changed in IR today, dressing was clean/dry. Discharge instructions explained to pt and her husband, questions answered. Pt denies any pain. Discharged via wheelchair

## 2017-03-09 NOTE — Procedures (Signed)
Gastrostomy was partially dislodged in soft tissues.  G-tube was exchanged and upsized to 20 [place].  New tube is well positioned in stomach and ready to be used.  No blood loss and no immediate complication.

## 2017-03-09 NOTE — Evaluation (Signed)
Physical Therapy Evaluation Patient Details Name: Kristen Escobar MRN: [id number] DOB: 1946-03-19 Today's Date: 03/09/2017   History of Present Illness  71 yo female admitted with neutropenic fever. Hx of hypothyroidism, PE, lymphoma, breast cancer, PEG  Clinical Impression  On eval, pt was Min guard assist for mobility. She walked ~150 feet while pushing her IV pole. She did rely on the pole for some support. Dyspnea 2/4. O2 sat 97% on RA at rest, 90% on RA during ambulation. Pt plans to return home at discharge. Recently had HHPT visit-1 visit-no HHPT needs per pt/husband. Will follow during hospital stay.     Follow Up Recommendations Supervision/Assistance - 24 hour    Equipment Recommendations  None recommended by PT    Recommendations for Other Services       Precautions / Restrictions Precautions Precautions: Fall Precaution Comments: monitor O2 sat Restrictions Weight Bearing Restrictions: No      Mobility  Bed Mobility Overal bed mobility: Modified Independent       Supine to sit: HOB elevated Sit to supine: HOB elevated      Transfers Overall transfer level: Needs assistance   Transfers: Sit to/from Stand Sit to Stand: Supervision         General transfer comment: for safety, lines.   Ambulation/Gait Ambulation/Gait assistance: Min guard Ambulation Distance (Feet): 150 Feet Assistive device: (IV pole) Gait Pattern/deviations: Step-through pattern     General Gait Details: close guard for safety. Dyspnea 2/4. O2 sat 90% on RA.   Stairs            Wheelchair Mobility    Modified Rankin (Stroke Patients Only)       Balance                                             Pertinent Vitals/Pain Pain Assessment: No/denies pain    Home Living Family/patient expects to be discharged to:: Private residence Living Arrangements: Spouse/significant other Available Help at Discharge: Family;Available 24 hours/day Type of  Home: House Home Access: Stairs to enter Entrance Stairs-Rails: Psychiatric nurse of Steps: 4 Home Layout: One level Home Equipment: Walker - 2 wheels;Bedside commode      Prior Function Level of Independence: Independent               Hand Dominance        Extremity/Trunk Assessment   Upper Extremity Assessment Upper Extremity Assessment: Generalized weakness    Lower Extremity Assessment Lower Extremity Assessment: Generalized weakness    Cervical / Trunk Assessment Cervical / Trunk Assessment: Normal  Communication   Communication: No difficulties  Cognition Arousal/Alertness: Awake/alert Behavior During Therapy: WFL for tasks assessed/performed Overall Cognitive Status: Within Functional Limits for tasks assessed                                        General Comments      Exercises     Assessment/Plan    PT Assessment Patient needs continued PT services  PT Problem List Decreased mobility;Decreased activity tolerance       PT Treatment Interventions Gait training;Functional mobility training;Therapeutic exercise;Therapeutic activities;Patient/family education    PT Goals (Current goals can be found in the Care Plan section)  Acute Rehab PT Goals Patient Stated Goal: to get home PT Goal  Formulation: With patient/family Time For Goal Achievement: 03/23/17 Potential to Achieve Goals: Good    Frequency Min 3X/week   Barriers to discharge        Co-evaluation               AM-PAC PT "6 Clicks" Daily Activity  Outcome Measure Difficulty turning over in bed (including adjusting bedclothes, sheets and blankets)?: None Difficulty moving from lying on back to sitting on the side of the bed? : None Difficulty sitting down on and standing up from a chair with arms (e.g., wheelchair, bedside commode, etc,.)?: None Help needed moving to and from a bed to chair (including a wheelchair)?: A Little Help needed  walking in hospital room?: A Little Help needed climbing 3-5 steps with a railing? : A Little 6 Click Score: 21    End of Session   Activity Tolerance: Patient tolerated treatment well Patient left: in bed;with call bell/phone within reach;with family/visitor present   PT Visit Diagnosis: Difficulty in walking, not elsewhere classified (R26.2)    Time: 4742-5956 PT Time Calculation (min) (ACUTE ONLY): 11 min   Charges:   PT Evaluation $PT Eval Moderate Complexity: 1 Mod     PT G Codes:          Weston Anna, MPT Pager: [phone]

## 2017-03-09 NOTE — Discharge Summary (Signed)
Physician Discharge Summary  Kristen Escobar DDU:[id number] DOB: June 29, 1945 DOA: 03/05/2017  PCP: Marletta Lor, MD  Admit date: 03/05/2017 Discharge date: 03/09/2017  Admitted From: home  Disposition:  home  Recommendations for Outpatient Follow-up:  1. Follow up with Dr. Lindi Adie on 11/30.  CT scan prior to visit 2. Continue xarelto at once daily dosing  Home Health:  none  Equipment/Devices:  None  Discharge Condition:  Stable, improved CODE STATUS:  Full code  Diet recommendation:  NPO with tube feeds as before.  Given Osmolite 1.5 at 48mL/h during hospitalization but does bolus feeds at home with 13mL free water flushes q4h   Brief/Interim Summary:  Kristen Escobar a 71 y.o.femalewith a past medical history of hypothyroidism,recently diagnosed with diffuse large B-cell lymphoma, tracheoesophageal fistula, recently diagnosed pulmonary embolism,who was given cycle 3 of her chemotherapy on 11/9. On the morning of admission she woke up with a fever of 101.3 F. She called her cancer center providers and was asked to come in for checkup. She was found to have neutropenia. She was subsequently transferred to the hospital for admission. She had some projectile vomiting a few days prior to admission, better with reglan.  Cough has been stable and no other localizing symptoms.  She was initially started on vancomycin and Zosyn however she did not have any evidence of port infection or cellulitis so her vancomycin was discontinued.  Her Zosyn was changed to cefepime due to her pancytopenia.  She continued to have intermittent fevers and has developed watery diarrhea.  C. Diff PCR and GI pathogen panel were negative.  She started imodium and her diarrhea has resolved.  Her reglan has been stopped.  Hemoglobin trended down to 6.5g/dl so received blood transfusion on 11/18.  As cultures were negative, her antibiotics have been stopped now that her neutropenia has resolved.  Her xarelto was  held while she was thrombocytopenic less than 50 000, but has been resumed at once daily dosing.  Her PEG tube had some leaking and her bumper may have accidentally been loosened, but IR was consulted and her PEG tube was replaced prior to discharge.  Follow up with Dr. Lindi Adie on 11/30 with CT scan prior to visit.    Discharge Diagnoses:  Principal Problem:   Neutropenic fever ([place]) Active Problems:   Breast cancer of upper-outer quadrant of right female breast ([place])   Hypothyroidism   Diffuse large B cell lymphoma ([place])   Port-A-Cath in place   Tracheoesophageal fistula, acquired ([place])   Pulmonary embolism ([facility])  Neutropenic fever/pancytopenia:  Diarrhea has resolved and fevers have improved -  C. Diff PCR negative -  GI pathogen panel negative -  Imodium prn -  Continued cefepime while neutropenic, but stop antibiotics now that neutropenia has resolved -  Patient received Neulasta after last chemo and therefore did not get granix -  Transfused 1 unit PRBC on 11/18 -  Resumed xarelto on 11/19 -  Blood cultures no growth to date -  Urinalysis and urine culture performed after starting abx and are negative  #2 Diffuse large B-cell lymphoma: Being treated with R CHOP by oncology. Last cycle was on 11/9 which was herthird cycle. -  Dr. Lindi Adie will reduce dose of next cycle of chemo -  CT scan prior to next visit  #3 Tracheoesophageal fistula: Seen by cardiothoracic surgery during previous hospitalization.  -  Tertiary center referral for further management as outpatient  #4 Recently diagnosed pulmonary embolism - Held Xarelto due to thrombocytopenia, but  resumed on 11/19  #5 Hypothyroidism: Continued home medications  #6Nutrition: As a result of the tracheoesophageal fistula patient is unable to take anything by mouth. She has a PEG tube in place. She does bolus feeding at home. Her PEG tube malfunctioned and was replaced on 11/19.  #7 transaminitis due to  chemotherapy: LFTs have improved.    Discharge Instructions  Discharge Instructions    Call MD for:  difficulty breathing, headache or visual disturbances   Complete by:  As directed    Call MD for:  extreme fatigue   Complete by:  As directed    Call MD for:  persistant dizziness or light-headedness   Complete by:  As directed    Call MD for:  persistant nausea and vomiting   Complete by:  As directed    Call MD for:  severe uncontrolled pain   Complete by:  As directed    Call MD for:  temperature >100.4   Complete by:  As directed    Increase activity slowly   Complete by:  As directed        Medication List    STOP taking these medications   amoxicillin-clavulanate 875-125 MG tablet Commonly known as:  AUGMENTIN   chlorpheniramine-HYDROcodone 10-8 MG/5ML Suer Commonly known as:  TUSSIONEX   doxycycline 100 MG tablet Commonly known as:  VIBRA-TABS   metoCLOPramide 5 MG tablet Commonly known as:  REGLAN   Rivaroxaban 15 & 20 MG Tbpk Replaced by:  rivaroxaban 20 MG Tabs tablet     TAKE these medications   allopurinol 300 MG tablet Commonly known as:  ZYLOPRIM Take 1 tablet (300 mg total) by mouth daily.   HYDROcodone-acetaminophen 7.5-325 MG tablet Commonly known as:  NORCO Take 1 tablet every 4 (four) hours as needed by mouth. for pain   levothyroxine 50 MCG tablet Commonly known as:  SYNTHROID, LEVOTHROID Take 1 tablet (50 mcg total) daily by mouth.   LORazepam 0.5 MG tablet Commonly known as:  ATIVAN Take 1 tablet (0.5 mg total) by mouth every 6 (six) hours as needed (Nausea or vomiting).   omeprazole 20 MG capsule Commonly known as:  PRILOSEC Take 20 mg daily by mouth.   ondansetron 8 MG tablet Commonly known as:  ZOFRAN Take 1 tablet (8 mg total) every 8 (eight) hours as needed by mouth for refractory nausea / vomiting. Start day 3 after chemo.   predniSONE 20 MG tablet Commonly known as:  DELTASONE Take 3 tablets (60 mg total) by mouth  daily. Take on days 1-5 of chemotherapy.   prochlorperazine 10 MG tablet Commonly known as:  COMPAZINE Take 1 tablet (10 mg total) by mouth every 6 (six) hours as needed (Nausea or vomiting).   rivaroxaban 20 MG Tabs tablet Commonly known as:  XARELTO Take 1 tablet (20 mg total) daily with supper by mouth. Replaces:  Rivaroxaban 15 & 20 MG Tbpk   saccharomyces boulardii 250 MG capsule Commonly known as:  FLORASTOR Take 1 capsule (250 mg total) 2 (two) times daily by mouth.   sucralfate 1 g tablet Commonly known as:  CARAFATE Take 1 tablet (1 g total) 4 (four) times daily -  with meals and at bedtime by mouth. What changed:  Another medication with the same name was removed. Continue taking this medication, and follow the directions you see here.      Follow-up Information    Marletta Lor, MD Follow up.   Specialty:  Internal Medicine Contact information: [place]  [place] [address] [phone]        Nicholas Lose, MD. Go on 03/20/2017.   Specialty:  Hematology and Oncology Contact information: [place] [address] [phone]          No Known Allergies  Consultations: Dr. Lindi Adie, Oncology Dr. Anselm Pancoast, Interventional Radiology   Procedures/Studies: Dg Chest 2 View  Result Date: 03/05/2017 CLINICAL DATA:  Productive cough, fever. EXAM: CHEST  2 VIEW COMPARISON:  Radiographs of February 24, 2017. FINDINGS: The heart size and mediastinal contours are within normal limits. Atherosclerosis of thoracic aorta is noted. No pneumothorax or pleural effusion is noted. Right internal jugular Port-A-Cath is unchanged in position. Mild central pulmonary vascular congestion is noted. Mild right basilar opacity is noted concerning for possible atelectasis or infiltrate. The visualized skeletal structures are unremarkable. IMPRESSION: Aortic atherosclerosis. Mild right basilar subsegmental atelectasis or infiltrate is noted.  Electronically Signed   By: Marijo Conception, M.D.   On: 03/05/2017 10:34   Dg Chest 2 View  Result Date: 02/24/2017 CLINICAL DATA:  Fever and cough. Patient just released from hospitalization for esophageal fistula. EXAM: CHEST  2 VIEW COMPARISON:  02/16/2017 FINDINGS: Stable position and injectable right-sided port. Stable postsurgical changes in the right chest wall. Cardiomediastinal silhouette is normal. Mediastinal contours appear intact. Calcific atherosclerotic disease of the aorta and tortuosity. There is no evidence of focal airspace consolidation, pleural effusion or pneumothorax. Low lung volumes. Osseous structures are without acute abnormality. Soft tissues are grossly normal. IMPRESSION: No active cardiopulmonary disease. Electronically Signed   By: Fidela Salisbury M.D.   On: 02/24/2017 19:41   Dg Chest 2 View  Result Date: 02/16/2017 CLINICAL DATA:  Dyspnea on exertion. Hypoxia. Progressive shortness breath for 2 days. Esophageal cancer. EXAM: CHEST  2 VIEW COMPARISON:  Chest film associated with Port-A-Cath insertion 01/14/2017. PET scan 12/08/2016. FINDINGS: Heart size is normal. Lung volumes are low. There is no edema or effusion. No focal airspace disease present. Aortic atherosclerosis is noted. A right IJ Port-A-Cath is stable. Surgical clips are noted in the right breast and axilla. The upper abdomen is unremarkable. IMPRESSION: 1. No acute cardiopulmonary disease. 2. Low lung volumes. Electronically Signed   By: San Morelle M.D.   On: 02/16/2017 13:05   Ir Gastrostomy Tube Mod Sed  Result Date: 02/19/2017 INDICATION: 71 year old with lymphoma and tracheoesophageal fistula. Patient needs a gastrostomy tube in order to avoid the fistula. EXAM: PERCUTANEOUS GASTROSTOMY TUBE WITH FLUOROSCOPIC GUIDANCE Physician: Stephan Minister. Anselm Pancoast, MD MEDICATIONS: Ancef 2 g; Antibiotics were administered within 1 hour of the procedure. Glucagon 0.5 mg IV ANESTHESIA/SEDATION: Versed 2.0 mg IV;  Fentanyl 100 mcg IV Moderate Sedation Time:  37 minutes The patient was continuously monitored during the procedure by the interventional radiology nurse under my direct supervision. FLUOROSCOPY TIME:  Fluoroscopy Time: 5 minutes 6 seconds (98 mGy). COMPLICATIONS: None immediate. PROCEDURE: Informed consent was obtained for a percutaneous gastrostomy tube. The patient was placed on the interventional table. Fluoroscopy demonstrated gas in the transverse colon. An orogastric tube was placed with fluoroscopic guidance without complication. The anterior abdomen was prepped and draped in sterile fashion. Maximal barrier sterile technique was utilized including caps, mask, sterile gowns, sterile gloves, sterile drape, hand hygiene and skin antiseptic. Stomach was inflated with air through the orogastric tube. The skin and subcutaneous tissues were anesthetized with 1% lidocaine. A total of three SAF-T-PEXY T-fasteners were placed in the stomach using fluoroscopic guidance. Small incision was made between the T-fasteners.  Needle was directed into the stomach with fluoroscopic guidance between the T-fasteners. A small amount of contrast was injected to confirm placement in the stomach. Wire was advanced into the stomach. The tract was dilated to accommodate an 18 French peel-away sheath. A 16 French Entuit gastrostomy tube was easily advanced over the wire and a peel-away sheath was removed. The balloon was inflated with 6 mL of saline. Contrast injection confirmed placement in the stomach. The gastrostomy tube was flushed with normal saline. Fluoroscopic images were taken and saved for this procedure. IMPRESSION: Successful fluoroscopic guided percutaneous gastrostomy tube placement. Electronically Signed   By: Markus Daft M.D.   On: 02/19/2017 18:49   Nm Pulmonary Perf And Vent  Addendum Date: 02/16/2017   ADDENDUM REPORT: 02/16/2017 20:22 ADDENDUM: Initial attempts to contact Hospitalists by pager at [facility].  Ultimately, Critical Value/emergent results were called by telephone at the time of interpretation on 02/16/2017 at 8:20 pm to Nurse [facility], who verbally acknowledged these results. Electronically Signed   By: Suzy Bouchard M.D.   On: 02/16/2017 20:22   Result Date: 02/16/2017 CLINICAL DATA:  Elevated D-dimer. Esophageal carcinoma. Elevated white count. Lynnell Fiumara of breath. EXAM: NUCLEAR MEDICINE VENTILATION - PERFUSION LUNG SCAN TECHNIQUE: Ventilation images were obtained in multiple projections using inhaled aerosol Tc-43m DTPA. Perfusion images were obtained in multiple projections after intravenous injection of Tc-47m MAA. RADIOPHARMACEUTICALS:  28.8 mCi Technetium-41m DTPA aerosol inhalation and 4.1 mCi Technetium-90m MAA IV COMPARISON:  Chest radiograph 02/16/2017 FINDINGS: Ventilation: Informed ventilation per Perfusion: There are subtle wedge-shaped perfusion defects in the LEFT RIGHT upper lobe. Smaller defects at the LEFT and RIGHT lung base posteriorly. These peripheral perfusion defects are unmatched to ventilation. IMPRESSION: Bilateral un matched wedge-shaped peripheral perfusion defects most consistent with acute pulmonary embolism. Electronically Signed: By: Suzy Bouchard M.D. On: 02/16/2017 19:50   Ir Replc Gastro/colonic Tube Percut W/fluoro  Result Date: 02/25/2017 INDICATION: 71 year old female with a history of tracheoesophageal fistula, esophageal carcinoma, and recently placed percutaneous gastrostomy tube. Gastrostomy was placed 02/19/2017, and has been symptomatic, potentially withdrawn. The patient presents for evaluating the tube and possible replacement. EXAM: IMAGE GUIDED REPLACEMENT OF DISPLACED PERCUTANEOUS GASTROSTOMY MEDICATIONS: None ANESTHESIA/SEDATION: Versed 2.0 mg IV; Fentanyl 100 mcg IV Moderate Sedation Time:  20 minutes The patient was continuously monitored during the procedure by the interventional radiology nurse under my direct supervision. CONTRAST:  25 cc  - administered into the gastric lumen. FLUOROSCOPY TIME:  Fluoroscopy Time:  minutes  seconds ( mGy). COMPLICATIONS: None PROCEDURE: Informed written consent was obtained from the patient after a thorough discussion of the procedural risks, benefits and alternatives. All questions were addressed. Maximal Sterile Barrier Technique was utilized including caps, mask, sterile gowns, sterile gloves, sterile drape, hand hygiene and skin antiseptic. A timeout was performed prior to the initiation of the procedure. Patient was positioned supine position on the fluoroscopy table. The indwelling percutaneous gastrostomy tube was injected in AP and lateral, confirming that the tube had been withdrawn from the stomach lumen, into the abdominal wall. The upper abdomen and the G-tube were then prepped and draped in the usual sterile fashion. Using moderate sedation, the tube was replaced. The balloon remained inflated, and a 5 [place] Kumpe the catheter with contrast was used to navigate through the existing tract into the gastric lumen. Once the catheter was confirmed within the gastric lumen, a Rosen wire was placed. The catheter and the existing gastrostomy to were removed from the tract. Dilation of the soft tissue was  performed with 16 [place] dilator. Catheter was advanced over the [facility] wire, and the wire was exchanged for Amplatz wire. 60 French peel-away was then placed over the Amplatz wire into the stomach lumen. With the coaxial wire in place, the peel-away dilator was removed, the Kumpe catheter was inserted through the lumen of the new 59 French balloon retention Intuit gastrostomy tube, and then the coaxial Kumpe the catheter and gastrostomy were inserted through the peel-away sheath into the stomach. Peel-away sheath was removed. Contrast injected through the med port of the gastrostomy tube confirmed location. The wire and coaxial catheter were removed after inflation of the balloon retention. Final image was  stored. Patient tolerated the procedure well and remained hemodynamically stable throughout. No complications were encountered and no significant blood loss. IMPRESSION: Status post exchange/rescue of a displaced percutaneous gastrostomy, with placement of a new 82 French balloon retention tube. Signed, Dulcy Fanny. Earleen Newport, DO [name] Radiology Electronically Signed   By: Corrie Mckusick D.O.   On: 02/25/2017 16:32   Dg Abd 2 Views  Result Date: 02/27/2017 CLINICAL DATA:  Abdomen discomfort by newly placed G tube - Wednesday , 02/25/17; Hx diffuse large B-cell lymphoma EXAM: ABDOMEN - 2 VIEW COMPARISON:  02/25/2017 and previous FINDINGS: Visualized lung bases clear. No free air. Normal bowel gas pattern. Gastrostomy catheter projects in expected location. Scattered residual oral contrast in the colon. Gastropexy fasteners have advanced to the region of the cecum. No abnormal abdominal calcifications. Bilateral hip arthroplasty hardware incompletely visualized. IMPRESSION: Stable postop changes.  No acute disease. Electronically Signed   By: Lucrezia Europe M.D.   On: 02/27/2017 11:07   Dg Abd Portable 1v  Result Date: 03/08/2017 CLINICAL DATA:  71 year old female with G-tube placement. EXAM: PORTABLE ABDOMEN - 1 VIEW COMPARISON:  Abdominal radiograph dated 02/27/2017 FINDINGS: A percutaneous gastrostomy is noted with tip in the left upper abdomen. There is no bowel dilatation or evidence of obstruction. Multiple gallstones are noted. There is degenerative changes of the spine. No acute osseous pathology. IMPRESSION: 1. Percutaneous gastrostomy with tip in the left upper abdomen. 2. No evidence of bowel obstruction. 3. Cholelithiasis. Electronically Signed   By: Anner Crete M.D.   On: 03/08/2017 03:38   Dg Swallowing Func-speech Pathology  Result Date: 02/17/2017 CLINICAL DATA:  71 year old female diagnosed with Lymphoma including a mass at the left  tracheoesophageal groove near the thyroid. Multiple imaging guided biopsies were performed, but ultimately a surgical biopsy of the mass was necessary on 12/26/2016 for diagnosis. The patient is now status post chemotherapy, with an unexplained cough, coughing with p.o. intake. EXAM: MODIFIED BARIUM SWALLOW TECHNIQUE: Different consistencies of barium were administered orally to the patient by the Speech Pathologist. Imaging of the pharynx was performed in the lateral projection. FLUOROSCOPY TIME:  Fluoroscopy Time:  0 minutes 54 seconds Radiation Exposure Index (if provided by the fluoroscopic device): 1.8mG y Number of Acquired Spot Images: 0 COMPARISON:  Neck CT 11/21/2016. FINDINGS: Nectar thick liquid- the patient is able to swallow liquid into the cervical esophagus, but with each swallow there is barium extension from the cervical esophagus (C5/C6 level) through the posterior wall of the trachea into the tracheal lumen. This occurs about 18 mm below the level of the glottis (series 5, image 4). 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 PO barium liquid freely passed through the fistula and into the airway with  each swallow. Study discussed by telephone with Dr. Hosie Poisson on 02/17/2017 at 1245 hours. Please refer to the Speech Pathologists report for complete details and recommendations. Electronically Signed   By: Genevie Ann M.D.   On: 02/17/2017 13:28    Subjective: Feeling much better.  No diarrhea since yesterday.  No vomiting or abdominal pain.  PEG tube has been leaking since yesterday.    Discharge Exam: Vitals:   03/08/17 2033 03/09/17 0506  BP: 128/74 (!) 146/68  Pulse: 96 (!) 101  Resp: 18 18  Temp: 98.4 F (36.9 C) 98.3 F (36.8 C)  SpO2: 98% 99%   Vitals:   03/08/17 2033 03/09/17 0032 03/09/17 0500 03/09/17 0506  BP: 128/74   (!) 146/68  Pulse: 96   (!) 101  Resp: 18   18  Temp: 98.4  F (36.9 C)   98.3 F (36.8 C)  TempSrc: Oral   Oral  SpO2: 98%   99%  Weight:  101.6 kg (224 lb) 101.6 kg (224 lb)   Height:        General: Pt is alert, awake, not in acute distress Cardiovascular: RRR, S1/S2 +, no rubs, no gallops Respiratory: CTA bilaterally, no wheezing, no rhonchi Abdominal: Soft, NT, ND, bowel sounds +.  PEG tube with bumper still at 8cm but clearly leaking around tube.   Extremities: no edema, no cyanosis    The results of significant diagnostics from this hospitalization (including imaging, microbiology, ancillary and laboratory) are listed below for reference.     Microbiology: Recent Results (from the past 240 hour(s))  Culture, Blood     Status: None (Preliminary result)   Collection Time: 03/05/17 11:01 AM  Result Value Ref Range Status   BLOOD CULTURE, ROUTINE Preliminary report  Preliminary   Organism ID, Bacteria Comment  Preliminary    Comment: No growth in 36 - 48 hours.  TECHNOLOGIST REVIEW     Status: None   Collection Time: 03/05/17 11:02 AM  Result Value Ref Range Status   Technologist Review Oc lymph, rare seg  Final  Culture, Blood     Status: None (Preliminary result)   Collection Time: 03/05/17 11:12 AM  Result Value Ref Range Status   BLOOD CULTURE, ROUTINE Preliminary report  Preliminary   Organism ID, Bacteria Comment  Preliminary    Comment: No growth in 36 - 48 hours.  Culture, blood (Routine X 2) w Reflex to ID Panel     Status: None (Preliminary result)   Collection Time: 03/06/17  4:11 AM  Result Value Ref Range Status   Specimen Description BLOOD LEFT ANTECUBITAL  Final   Special Requests   Final    BOTTLES DRAWN AEROBIC ONLY Blood Culture adequate volume   Culture   Final    NO GROWTH 3 DAYS Performed at [facility], [address], [place], [place] [address]    Report Status PENDING  Incomplete  Culture, blood (Routine X 2) w Reflex to ID Panel     Status: None (Preliminary result)   Collection Time:  03/06/17  4:11 AM  Result Value Ref Range Status   Specimen Description BLOOD RIGHT ARM  Final   Special Requests   Final    BOTTLES DRAWN AEROBIC ONLY Blood Culture adequate volume   Culture   Final    NO GROWTH 3 DAYS Performed at [facility], [address] [place],  [address]    Report Status PENDING  Incomplete  Culture, Urine  Status: None   Collection Time: 03/06/17  9:20 PM  Result Value Ref Range Status   Specimen Description URINE, RANDOM  Final   Special Requests NONE  Final   Culture   Final    NO GROWTH Performed at [facility], [address] [place], [place] [address]    Report Status 03/08/2017 FINAL  Final  C difficile quick scan w PCR reflex     Status: None   Collection Time: 03/07/17  1:44 PM  Result Value Ref Range Status   C Diff antigen NEGATIVE NEGATIVE Final   C Diff toxin NEGATIVE NEGATIVE Final   C Diff interpretation No C. difficile detected.  Final  Gastrointestinal Panel by PCR , Stool     Status: None   Collection Time: 03/07/17  1:44 PM  Result Value Ref Range Status   Campylobacter species NOT DETECTED NOT DETECTED Final   Plesimonas shigelloides NOT DETECTED NOT DETECTED Final   Salmonella species NOT DETECTED NOT DETECTED Final   Yersinia enterocolitica NOT DETECTED NOT DETECTED Final   Vibrio species NOT DETECTED NOT DETECTED Final   Vibrio cholerae NOT DETECTED NOT DETECTED Final   Enteroaggregative E coli (EAEC) NOT DETECTED NOT DETECTED Final   Enteropathogenic E coli (EPEC) NOT DETECTED NOT DETECTED Final   Enterotoxigenic E coli (ETEC) NOT DETECTED NOT DETECTED Final   Shiga like toxin producing E coli (STEC) NOT DETECTED NOT DETECTED Final   Shigella/Enteroinvasive E coli (EIEC) NOT DETECTED NOT DETECTED Final   Cryptosporidium NOT DETECTED NOT DETECTED Final   Cyclospora cayetanensis NOT DETECTED NOT DETECTED Final   Entamoeba histolytica NOT DETECTED NOT DETECTED Final   Giardia lamblia NOT DETECTED NOT  DETECTED Final   Adenovirus F40/41 NOT DETECTED NOT DETECTED Final   Astrovirus NOT DETECTED NOT DETECTED Final   Norovirus GI/GII NOT DETECTED NOT DETECTED Final   Rotavirus A NOT DETECTED NOT DETECTED Final   Sapovirus (I, II, IV, and V) NOT DETECTED NOT DETECTED Final     Labs: BNP (last 3 results) No results for input(s): BNP in the last 8760 hours. Basic Metabolic Panel: Recent Labs  Lab 03/05/17 1102 03/06/17 0411 03/07/17 0500 03/08/17 0451 03/09/17 0428  NA 136 136  --  140 140  K 4.0 3.8  --  3.1* 4.1  CL  --  110  --  112* 113*  CO2 22 22  --  22 23  GLUCOSE 142* 212*  --  121* 173*  BUN 15.4 13  --  15 16  CREATININE 1.0 0.97 0.90 0.72 0.72  CALCIUM 8.3* 7.4*  --  7.5* 7.9*  MG  --   --   --  1.9 2.4   Liver Function Tests: Recent Labs  Lab 03/05/17 1102 03/08/17 0451 03/09/17 0428  AST 36* 20 23  ALT 309* 78* 67*  ALKPHOS 51 31* 34*  BILITOT 1.36* 0.4 0.5  PROT 6.0* 5.0* 5.5*  ALBUMIN 2.4* 1.8* 2.0*   No results for input(s): LIPASE, AMYLASE in the last 168 hours. No results for input(s): AMMONIA in the last 168 hours. CBC: Recent Labs  Lab 03/05/17 1102 03/06/17 0411 03/07/17 0500 03/08/17 0451 03/08/17 2011 03/09/17 0428  WBC < 0.2* 0.1* 0.2* 0.5*  --  2.3*  NEUTROABS  --   --   --  0.2*  --  1.7  HGB 8.3* 7.1* 7.3* 6.5* 8.7* 8.4*  HCT 26.1* 21.0* 22.0* 20.1* 26.0* 25.3*  MCV 87.9 86.8 86.6 85.5  --  87.2  PLT 64* 32* 27* 26*  --  58*   Cardiac Enzymes: No results for input(s): CKTOTAL, CKMB, CKMBINDEX, TROPONINI in the last 168 hours. BNP: Invalid input(s): POCBNP CBG: Recent Labs  Lab 03/08/17 2007 03/09/17 0007 03/09/17 0401 03/09/17 0737 03/09/17 1214  GLUCAP 152* 134* 158* 160* 120*   D-Dimer No results for input(s): DDIMER in the last 72 hours. Hgb A1c No results for input(s): HGBA1C in the last 72 hours. Lipid Profile No results for input(s): CHOL, HDL, LDLCALC, TRIG, CHOLHDL, LDLDIRECT in the last 72 hours. Thyroid  function studies No results for input(s): TSH, T4TOTAL, T3FREE, THYROIDAB in the last 72 hours.  Invalid input(s): FREET3 Anemia work up No results for input(s): VITAMINB12, FOLATE, FERRITIN, TIBC, IRON, RETICCTPCT in the last 72 hours. Urinalysis    Component Value Date/Time   COLORURINE YELLOW 03/06/2017 2120   APPEARANCEUR CLEAR 03/06/2017 2120   LABSPEC 1.018 03/06/2017 2120   PHURINE 6.0 03/06/2017 2120   GLUCOSEU NEGATIVE 03/06/2017 2120   GLUCOSEU NEGATIVE 07/24/2009 0750   HGBUR NEGATIVE 03/06/2017 2120   BILIRUBINUR NEGATIVE 03/06/2017 2120   KETONESUR NEGATIVE 03/06/2017 2120   PROTEINUR NEGATIVE 03/06/2017 2120   UROBILINOGEN 0.2 01/14/2012 1030   NITRITE NEGATIVE 03/06/2017 2120   LEUKOCYTESUR NEGATIVE 03/06/2017 2120   Sepsis Labs Invalid input(s): PROCALCITONIN,  WBC,  LACTICIDVEN   Time coordinating discharge: Over 30 minutes  SIGNED:   Janece Canterbury, MD  [facility] 03/09/2017, 1:46 PM Pager   If 7PM-7AM, please contact night-coverage [email or web address] Password [email or web address]

## 2017-03-09 NOTE — Progress Notes (Signed)
Pharmacy Antibiotic Note  Kristen Escobar is a 71 y.o. female with recently diagnosed diffuse large B-cell lymphoma (last chemo 02/27/2017) admitted on 03/05/2017 with febrile neutropenia. Pharmacy initially consulted for Vancomycin and Zosyn dosing. Zosyn now transitioned to Cefepime.    Plan: - continue Cefepime 2g IV q8h - Monitor renal function, cultures, clinical course.    Height: 5\' 2"  (157.5 cm) Weight: 224 lb (101.6 kg) IBW/kg (Calculated) : 50.1  Temp (24hrs), Avg:98.5 F (36.9 C), Min:98 F (36.7 C), Max:99.3 F (37.4 C)  Recent Labs  Lab 03/05/17 1102 03/05/17 1102 03/06/17 0411 03/07/17 0500 03/08/17 0451 03/09/17 0428  WBC  --  < 0.2* 0.1* 0.2* 0.5* 2.3*  CREATININE 1.0  --  0.97 0.90 0.72 0.72    Estimated Creatinine Clearance: 73 mL/min (by C-G formula based on SCr of 0.72 mg/dL).    No Known Allergies  Antimicrobials this admission: 11/15 vanc >> 11/16 11/15 zosyn >> 11/16 11/16 cefepime >>  Dose adjustments this admission: --  Microbiology results: 11/16 BCx: ngtd 11/16 UCx: NGF 11/17 CDiff: neg  Thank you for allowing pharmacy to be a part of this patient's care.   Dolly Rias RPh 03/09/2017, 11:28 AM Pager [phone]

## 2017-03-10 ENCOUNTER — Telehealth: Payer: Self-pay | Admitting: *Deleted

## 2017-03-10 DIAGNOSIS — N183 Chronic kidney disease, stage 3 (moderate): Secondary | ICD-10-CM | POA: Diagnosis not present

## 2017-03-10 DIAGNOSIS — Z431 Encounter for attention to gastrostomy: Secondary | ICD-10-CM | POA: Diagnosis not present

## 2017-03-10 DIAGNOSIS — J860 Pyothorax with fistula: Secondary | ICD-10-CM | POA: Diagnosis not present

## 2017-03-10 DIAGNOSIS — D631 Anemia in chronic kidney disease: Secondary | ICD-10-CM | POA: Diagnosis not present

## 2017-03-10 DIAGNOSIS — C8330 Diffuse large B-cell lymphoma, unspecified site: Secondary | ICD-10-CM | POA: Diagnosis not present

## 2017-03-10 DIAGNOSIS — I129 Hypertensive chronic kidney disease with stage 1 through stage 4 chronic kidney disease, or unspecified chronic kidney disease: Secondary | ICD-10-CM | POA: Diagnosis not present

## 2017-03-10 NOTE — Telephone Encounter (Signed)
Unable to reach patient at time of TCM Call.  Line busy at time of call. Will attempt call back at another time.

## 2017-03-10 NOTE — Telephone Encounter (Signed)
2nd call from pt's husband today. He states that pt had a sudden episode of vomiting, no nausea prior to vomiting. This vomiting caused pt to have excessive coughing that has lasted >15 minutes. Pt's husband is aware of her potential for aspirating d/t TE fistula.  He states he thinks most of her feeding that she received 1 hour prior was vomited out and now she is coughing up copious amounts of somewhat frothy whitish clear secretions.  He states that her coughing is starting to settle down a bit. Pt does not want to go to ED. They both want to have her seen in [facility] clinic tomorrow am.  High priority scheduling message sent for an appt tomorrow @ 9:30 am.  Also instructed husband to take pt to ED if coughing did not stop/if she became SOB or developed a fever. He voiced understanding.

## 2017-03-10 NOTE — Telephone Encounter (Signed)
TCT from pt's husband. He states he is concerned about pt's feeding tube. He noticed that there was 'yellow fluid' in the tube and that it flowed out when capped of tube removed. He tried to give pt her meds by gravity and the yellow fluid continued to back flow intpo syringe. He tried to give some meds but they seemed to have leaked out of the syringe. He called requesting some guidance. The home health nurse is coming this afternoon. Discussed with husband the technique for checking residual in pt's stomach prior to giving meds/feeding. Talked him through the process for checking residual and he did this while on the phone. Minimal residual according to husband. Instructed to check residual each time.  Her voiced understanding.  He also asked if pt needed labs done before her next appt. Which is 03/20/17.  Checked with Dr, Lindi Adie. He advised that she does not need labs before that appt.  Pt's husband made aware of that and he voiced understanding.

## 2017-03-11 ENCOUNTER — Ambulatory Visit (HOSPITAL_BASED_OUTPATIENT_CLINIC_OR_DEPARTMENT_OTHER): Payer: Medicare Other | Admitting: Medical

## 2017-03-11 ENCOUNTER — Encounter (HOSPITAL_COMMUNITY): Payer: Self-pay | Admitting: Emergency Medicine

## 2017-03-11 ENCOUNTER — Other Ambulatory Visit: Payer: Self-pay

## 2017-03-11 ENCOUNTER — Inpatient Hospital Stay (HOSPITAL_COMMUNITY)
Admission: EM | Admit: 2017-03-11 | Discharge: 2017-03-13 | DRG: 085 | Disposition: A | Discharge status: Home Health | Payer: Medicare Other | Attending: Internal Medicine | Admitting: Internal Medicine

## 2017-03-11 ENCOUNTER — Emergency Department (HOSPITAL_COMMUNITY): Payer: Medicare Other

## 2017-03-11 VITALS — BP 136/49 | HR 91 | Temp 99.2°F | Resp 17

## 2017-03-11 DIAGNOSIS — C8331 Diffuse large B-cell lymphoma, lymph nodes of head, face, and neck: Secondary | ICD-10-CM

## 2017-03-11 DIAGNOSIS — I629 Nontraumatic intracranial hemorrhage, unspecified: Secondary | ICD-10-CM | POA: Diagnosis not present

## 2017-03-11 DIAGNOSIS — K219 Gastro-esophageal reflux disease without esophagitis: Secondary | ICD-10-CM | POA: Diagnosis present

## 2017-03-11 DIAGNOSIS — Z96643 Presence of artificial hip joint, bilateral: Secondary | ICD-10-CM | POA: Diagnosis present

## 2017-03-11 DIAGNOSIS — Y92480 Sidewalk as the place of occurrence of the external cause: Secondary | ICD-10-CM

## 2017-03-11 DIAGNOSIS — C8330 Diffuse large B-cell lymphoma, unspecified site: Secondary | ICD-10-CM | POA: Diagnosis present

## 2017-03-11 DIAGNOSIS — J860 Pyothorax with fistula: Secondary | ICD-10-CM | POA: Diagnosis not present

## 2017-03-11 DIAGNOSIS — E039 Hypothyroidism, unspecified: Secondary | ICD-10-CM | POA: Diagnosis not present

## 2017-03-11 DIAGNOSIS — C8510 Unspecified B-cell lymphoma, unspecified site: Secondary | ICD-10-CM | POA: Diagnosis not present

## 2017-03-11 DIAGNOSIS — T451X5A Adverse effect of antineoplastic and immunosuppressive drugs, initial encounter: Secondary | ICD-10-CM

## 2017-03-11 DIAGNOSIS — Z8249 Family history of ischemic heart disease and other diseases of the circulatory system: Secondary | ICD-10-CM

## 2017-03-11 DIAGNOSIS — Z923 Personal history of irradiation: Secondary | ICD-10-CM

## 2017-03-11 DIAGNOSIS — Z79899 Other long term (current) drug therapy: Secondary | ICD-10-CM

## 2017-03-11 DIAGNOSIS — Z87891 Personal history of nicotine dependence: Secondary | ICD-10-CM | POA: Diagnosis not present

## 2017-03-11 DIAGNOSIS — C50411 Malignant neoplasm of upper-outer quadrant of right female breast: Secondary | ICD-10-CM

## 2017-03-11 DIAGNOSIS — W010XXA Fall on same level from slipping, tripping and stumbling without subsequent striking against object, initial encounter: Secondary | ICD-10-CM | POA: Diagnosis present

## 2017-03-11 DIAGNOSIS — I10 Essential (primary) hypertension: Secondary | ICD-10-CM | POA: Diagnosis present

## 2017-03-11 DIAGNOSIS — I2699 Other pulmonary embolism without acute cor pulmonale: Secondary | ICD-10-CM | POA: Diagnosis present

## 2017-03-11 DIAGNOSIS — R112 Nausea with vomiting, unspecified: Secondary | ICD-10-CM

## 2017-03-11 DIAGNOSIS — Z86711 Personal history of pulmonary embolism: Secondary | ICD-10-CM

## 2017-03-11 DIAGNOSIS — S098XXA Other specified injuries of head, initial encounter: Secondary | ICD-10-CM | POA: Diagnosis not present

## 2017-03-11 DIAGNOSIS — S065X0A Traumatic subdural hemorrhage without loss of consciousness, initial encounter: Principal | ICD-10-CM | POA: Diagnosis present

## 2017-03-11 DIAGNOSIS — Z89021 Acquired absence of right finger(s): Secondary | ICD-10-CM

## 2017-03-11 DIAGNOSIS — Z6841 Body Mass Index (BMI) 40.0 and over, adult: Secondary | ICD-10-CM | POA: Diagnosis not present

## 2017-03-11 DIAGNOSIS — Z89022 Acquired absence of left finger(s): Secondary | ICD-10-CM

## 2017-03-11 DIAGNOSIS — Z978 Presence of other specified devices: Secondary | ICD-10-CM | POA: Diagnosis present

## 2017-03-11 DIAGNOSIS — S065X9A Traumatic subdural hemorrhage with loss of consciousness of unspecified duration, initial encounter: Secondary | ICD-10-CM | POA: Diagnosis present

## 2017-03-11 DIAGNOSIS — S0990XA Unspecified injury of head, initial encounter: Secondary | ICD-10-CM | POA: Diagnosis not present

## 2017-03-11 DIAGNOSIS — Z7901 Long term (current) use of anticoagulants: Secondary | ICD-10-CM

## 2017-03-11 DIAGNOSIS — R51 Headache: Secondary | ICD-10-CM | POA: Diagnosis not present

## 2017-03-11 DIAGNOSIS — J320 Chronic maxillary sinusitis: Secondary | ICD-10-CM | POA: Diagnosis present

## 2017-03-11 DIAGNOSIS — Z7952 Long term (current) use of systemic steroids: Secondary | ICD-10-CM

## 2017-03-11 DIAGNOSIS — L8990 Pressure ulcer of unspecified site, unspecified stage: Secondary | ICD-10-CM | POA: Diagnosis present

## 2017-03-11 DIAGNOSIS — R739 Hyperglycemia, unspecified: Secondary | ICD-10-CM | POA: Diagnosis present

## 2017-03-11 DIAGNOSIS — S065XAA Traumatic subdural hemorrhage with loss of consciousness status unknown, initial encounter: Secondary | ICD-10-CM | POA: Diagnosis present

## 2017-03-11 DIAGNOSIS — Z79890 Hormone replacement therapy (postmenopausal): Secondary | ICD-10-CM

## 2017-03-11 HISTORY — DX: Acute respiratory failure with hypoxia: J96.01

## 2017-03-11 LAB — BASIC METABOLIC PANEL
Anion gap: 7 (ref 5–15)
BUN: 16 mg/dL (ref 6–20)
CO2: 24 mmol/L (ref 22–32)
Calcium: 8.1 mg/dL — ABNORMAL LOW (ref 8.9–10.3)
Chloride: 107 mmol/L (ref 101–111)
Creatinine, Ser: 0.91 mg/dL (ref 0.44–1.00)
GFR calc Af Amer: >60 mL/min (ref >60)
GFR calc non Af Amer: >60 mL/min (ref >60)
Glucose, Bld: 110 mg/dL — ABNORMAL HIGH (ref 65–99)
Potassium: 3.7 mmol/L (ref 3.5–5.1)
Sodium: 138 mmol/L (ref 135–145)

## 2017-03-11 LAB — CULTURE, BLOOD (ROUTINE X 2)
CULTURE: NO GROWTH
CULTURE: NO GROWTH
SPECIAL REQUESTS: ADEQUATE
SPECIAL REQUESTS: ADEQUATE

## 2017-03-11 LAB — HEMOGLOBIN A1C
HEMOGLOBIN A1C: 6.8 % — AB (ref 4.8–5.6)
MEAN PLASMA GLUCOSE: 148.46 mg/dL

## 2017-03-11 LAB — TYPE AND SCREEN
ABO/RH(D): A POS
Antibody Screen: NEGATIVE

## 2017-03-11 LAB — CBC WITH DIFFERENTIAL/PLATELET
Basophils Absolute: 0.1 10*3/uL (ref 0.0–0.1)
Basophils Relative: 1 %
Eosinophils Absolute: 0 10*3/uL (ref 0.0–0.7)
Eosinophils Relative: 0 %
HCT: 25.4 % — ABNORMAL LOW (ref 36.0–46.0)
Hemoglobin: 8.4 g/dL — ABNORMAL LOW (ref 12.0–15.0)
Lymphocytes Relative: 10 %
Lymphs Abs: 0.7 10*3/uL (ref 0.7–4.0)
MCH: 29.1 pg (ref 26.0–34.0)
MCHC: 33.1 g/dL (ref 30.0–36.0)
MCV: 87.9 fL (ref 78.0–100.0)
Monocytes Absolute: 0.7 10*3/uL (ref 0.1–1.0)
Monocytes Relative: 10 %
Neutro Abs: 5.4 10*3/uL (ref 1.7–7.7)
Neutrophils Relative %: 79 %
Platelets: 198 10*3/uL (ref 150–400)
RBC: 2.89 MIL/uL — ABNORMAL LOW (ref 3.87–5.11)
RDW: 18.4 % — ABNORMAL HIGH (ref 11.5–15.5)
WBC: 6.9 10*3/uL (ref 4.0–10.5)

## 2017-03-11 LAB — CULTURE, BLOOD (SINGLE)

## 2017-03-11 LAB — APTT: aPTT: 32 seconds (ref 24–36)

## 2017-03-11 LAB — PROTIME-INR
INR: 2.45
Prothrombin Time: 26.4 seconds — ABNORMAL HIGH (ref 11.4–15.2)

## 2017-03-11 LAB — MRSA PCR SCREENING: MRSA by PCR: NEGATIVE

## 2017-03-11 LAB — HEPARIN LEVEL (UNFRACTIONATED): Heparin Unfractionated: >2.2 IU/mL — ABNORMAL HIGH (ref 0.30–0.70)

## 2017-03-11 MED ORDER — MORPHINE SULFATE (PF) 4 MG/ML IV SOLN: 2.0000 mg | INTRAVENOUS | Status: DC | PRN

## 2017-03-11 MED ORDER — SODIUM CHLORIDE 0.9 % IV SOLN
INTRAVENOUS | Status: DC
  Administered 2017-03-11 – 2017-03-12 (×2): via INTRAVENOUS

## 2017-03-11 MED ORDER — PROTHROMBIN COMPLEX CONC HUMAN 500 UNITS IV KIT
5393.0000 [IU] | PACK | Status: AC
  Administered 2017-03-11: 5393 [IU] via INTRAVENOUS
  Filled 2017-03-11: qty 5393

## 2017-03-11 MED ORDER — HYDRALAZINE HCL 20 MG/ML IJ SOLN: 5.0000 mg | Freq: Four times a day (QID) | INTRAMUSCULAR | Status: DC | PRN

## 2017-03-11 MED ORDER — ONDANSETRON HCL 4 MG PO TABS: 4.0000 mg | ORAL_TABLET | Freq: Four times a day (QID) | ORAL | Status: DC | PRN

## 2017-03-11 MED ORDER — ONDANSETRON HCL 4 MG/2ML IJ SOLN
4.0000 mg | Freq: Four times a day (QID) | INTRAMUSCULAR | Status: DC | PRN
Start: 2017-03-11 — End: 2017-03-12

## 2017-03-11 NOTE — Care Management Obs Status (Addendum)
[place] NOTIFICATION   Patient Details  Name: Kristen Escobar MRN: [id number] Date of Birth: 23-Jan-1946   Medicare Observation Status Notification Given:  Yes    Lizza Huffaker, Benjaman Lobe, RN 03/11/2017, 3:24 PM

## 2017-03-11 NOTE — ED Notes (Signed)
[facility] called. 

## 2017-03-11 NOTE — ED Triage Notes (Signed)
Pt fell in the parking lot resulting in hematoma to posterior head just PTA; denies LOC; on blood thinners for recent PE.

## 2017-03-11 NOTE — ED Notes (Signed)
Called to give Report. RN Normand Sloop was unable to get report at this time and stated to call back in 10 minutes.

## 2017-03-11 NOTE — ED Notes (Signed)
Wilson Singer, MD at bedside and verbalizes will place orders.

## 2017-03-11 NOTE — ED Notes (Signed)
Pt needs approval for ICU addmission from Neurology/Intensivist. Per ICU@ [facility] Charge Nurse. Dr. Pricilla Handler has been made aware.

## 2017-03-11 NOTE — Care Management Note (Signed)
Case Management Note  Patient Details  Name: Kristen Escobar MRN: [id number] Date of Birth: [birth year]  CM noted pt was active with [facility].  Contacted Santiago Glad to follow.   Expected Discharge Date:                  Expected Discharge Plan:  [place] Arranged:  RN, PT [facility] Agency:  [facility]  Status of Service:  In process, will continue to follow  Rae Mar, RN 03/11/2017, 10:47 AM

## 2017-03-11 NOTE — Progress Notes (Signed)
Pt scheduled to be seen in [facility] this am. However pt fell outside of cancer center when getting out of the car. Pt hit the back of her head. No open wound but has a large hematoma. No loss of consciousness. Pt alert and oriented x3. No obvious neuro deficits.  Valet personnel got a nurse from infusion area immediately and pt was assisted into w/c and brought [facility]. Pt is on Xarelto.  Pt seen by Sandi Mealy, PA  He made Dr. Lindi Adie aware. Pt taken via w/c to ED for further evaluation. Pt's husband with her. VSS-see flowsheet.

## 2017-03-11 NOTE — H&P (Addendum)
History and Physical  Kristen Escobar RXV:[id number] DOB: Nov 26, 1945 DOA: 03/11/2017  Referring physician: Virgel Manifold, ER physician  PCP: Marletta Lor, MD  Outpatient Specialists: Dr.Gudena, oncology Patient coming from: Home & is able to ambulate without assistance  Chief Complaint: Fall and headache   HPI: Kristen Escobar is a 71 y.o. 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 Despite recent diagnosis, patient has had a complicated course including replacement of her PEG tube twice over and a pulmonary embolus of which she has been on xarelto. Patient just had a feeding tube replaced on Monday, 11/19. Her Osmolite tube feeds were restarted on Tuesday and she started having spontaneous episodes of vomiting. No associated nausea. Because of this, she was to go to the oncology symptom management department today at the regional [place] when on the way there, she tripped on the curb and fell hitting the back of her head. She did not lose consciousness, was not confused or dizzy. Staff from the office and family were able to get her back up to an upright position and it was advised that she go to the emergency room for further evaluation  ED Course: In the emergency room, patient was found to have a small parafalcine subdural hematoma.  No mass shift or midline shift noted. Incidental acute sinusitis noted as well.  Patient was given Wandalee Ferdinand to reverse her anticoagulation. Labs otherwise unremarkable.  (Patient has a hemoglobin of 8.4 which is unchanged from labs earlier this month.)  Case was discussed with Dr. Arnoldo Morale from neurosurgery who recommended no further neurosurgical intervention. Recommended monitoring overnight for observation and repeat head CT in the morning. Hospitalists were called  for further evaluation and admission.  Review of Systems: Patient seen in the emergency room . Pt complains of some overall generalized fatigue and chronic cough. She feels overwhelmed with the latest complication, but overall has no acute complaints. Some mild soreness where she fell, but no headache.   Pt denies any headache, vision changes, chest pain, palpitations, shortness of breath, wheeze, abdominal pain, hematuria, dysuria, constipation, diarrhea, nausea, dizziness, focal extremity numbness weakness or pain, .  Review of systems is otherwise negative   Past Medical History:  Diagnosis Date  . Acute respiratory failure with hypoxia ([place]) 02/16/2017  . Arthritis   . Cancer ([place]) 03/2009   breast- rt  . GERD (gastroesophageal reflux disease)   . History of radiation therapy 07/12/10,completed   right breast 60 Gy x30 fx  . Hypertension   . Hypothyroidism   . Obesity   . Peripheral vascular disease ([place]) 1995   PT DEVELOPED CIRCULATION PROBLEMS IN BOTH HANDS AND GANGRENE OF BOTH INDEX FINGERS--REQUIRING AMPUTATION OF THE INDEX FINGERS AND VASCULAR SURGERY.  PT TOLD HER PROBLEMS RELATED TO SMOKING.   NO OTHER PROBLEMS SINCE.  Marland Kitchen Pneumonia   . Thyroid mass    Past Surgical History:  Procedure Laterality Date  . AMPUTATION     partial amputation of both index fingers  . BREAST SURGERY  2011   lumpectomy with node sampling- RIGHT  . COLONOSCOPY    . ESOPHAGOGASTRODUODENOSCOPY    . EXCISION MASS NECK Left 12/26/2016   Procedure: EXCISION MASS NECK;  Surgeon: Izora Gala, MD;  Location: [facility];  Service: ENT;  Laterality: Left;  open excision of thyroid mass left side with frozen  section  . HAND SURGERY Bilateral 1995   Amputaed pointer fingers bilaterally  . IR FLUORO GUIDE PORT INSERTION RIGHT  01/14/2017  . IR GASTROSTOMY TUBE MOD SED  02/19/2017  . IR REPLC GASTRO/COLONIC TUBE PERCUT W/FLUORO  02/25/2017  . IR [place] TUBE PERCUT W/FLUORO  03/09/2017  . IR US GUIDE  VASC ACCESS RIGHT  01/14/2017  . TOTAL HIP ARTHROPLASTY  12/16/2011   right hip  . TOTAL HIP ARTHROPLASTY  01/20/2012   Procedure: TOTAL HIP ARTHROPLASTY ANTERIOR APPROACH;  Surgeon: Mauri Pole, MD;  Location: [facility];  Service: Orthopedics;  Laterality: Left;  . TUBAL LIGATION    . VASCULAR SURGERY     both hands    Social History:  reports that she quit smoking about 23 years ago. She has a 20.00 pack-year smoking history. she has never used smokeless tobacco. She reports that she drinks alcohol. She reports that she does not use drugs.  Lives at home with her husband, ambulates without assistance   No Known Allergies  Family History  Problem Relation Age of Onset  . Cancer Mother        pt unaware of what kind  . Hearing loss Mother   . Coronary artery disease Father   . Diabetes Father   . Coronary artery disease Brother   . Coronary artery disease Brother   . Diabetes Sister   . Cancer Sister        breast  . Colon cancer Neg Hx   . Stomach cancer Neg Hx   . Rectal cancer Neg Hx   . Esophageal cancer Neg Hx       Prior to Admission medications   Medication Sig Start Date End Date Taking? Authorizing Provider  allopurinol (ZYLOPRIM) 300 MG tablet Take 1 tablet (300 mg total) by mouth daily. Patient taking differently: Place 300 mg into feeding tube daily.  01/01/17  Yes Nicholas Lose, MD  levothyroxine (SYNTHROID, LEVOTHROID) 50 MCG tablet Take 1 tablet (50 mcg total) daily by mouth. Patient taking differently: Place 50 mcg into feeding tube daily.  03/02/17  Yes Marletta Lor, MD  loperamide (IMODIUM A-D) 2 MG tablet Place 2 mg into feeding tube 4 (four) times daily as needed for diarrhea or loose stools.   Yes [provider]  omeprazole (PRILOSEC) 20 MG capsule Take 20 mg daily by mouth.   Yes [provider]  ondansetron (ZOFRAN) 8 MG tablet Take 1 tablet (8 mg total) every 8 (eight) hours as needed by mouth for refractory nausea / vomiting.  Start day 3 after chemo. Patient taking differently: Place 8 mg into feeding tube every 8 (eight) hours as needed for refractory nausea / vomiting. Start day 3 after chemo. 03/04/17  Yes Tanner, Lyndon Code., PA-C  predniSONE (DELTASONE) 20 MG tablet Take 3 tablets (60 mg total) by mouth daily. Take on days 1-5 of chemotherapy. 01/01/17  Yes Nicholas Lose, MD  Rivaroxaban (XARELTO STARTER PACK) 15 & 20 MG TBPK Place 15 mg into feeding tube 2 (two) times daily. Take as directed on package: Start with one 15mg  tablet by mouth twice a day with food. On Day 22, switch to one 20mg  tablet once a day with food.   Yes [provider]  LORazepam (ATIVAN) 0.5 MG tablet Take 1 tablet (0.5 mg total) by mouth every 6 (six) hours as needed (Nausea or vomiting). Patient taking differently: Place 0.5 mg into feeding tube every 6 (six) hours as needed (Nausea or  vomiting).  01/01/17   Nicholas Lose, MD  prochlorperazine (COMPAZINE) 10 MG tablet Take 1 tablet (10 mg total) by mouth every 6 (six) hours as needed (Nausea or vomiting). Patient taking differently: Place 10 mg into feeding tube every 6 (six) hours as needed (Nausea or vomiting).  01/01/17   Nicholas Lose, MD  rivaroxaban (XARELTO) 20 MG TABS tablet Take 1 tablet (20 mg total) daily with supper by mouth. 03/09/17   Janece Canterbury, MD  saccharomyces boulardii (FLORASTOR) 250 MG capsule Take 1 capsule (250 mg total) 2 (two) times daily by mouth. Patient not taking: Reported on 03/11/2017 02/23/17   Patrecia Pour, MD  sucralfate (CARAFATE) 1 g tablet Take 1 tablet (1 g total) 4 (four) times daily -  with meals and at bedtime by mouth. 03/04/17   Harle Stanford., PA-C    Physical Exam: BP (!) 140/56   Pulse 89   Temp 98.9 F (37.2 C) (Oral)   Resp 16   Ht 5\' 2"  (1.575 m)   Wt 101.6 kg (224 lb)   SpO2 94%   BMI 40.97 kg/m   General:  Alert and oriented 3, mild distress secondary to feeling overwhelmed.  Eyes: Sclera nonicteric, extraocular movements  are intact  ENT: Normocephalic, mucous membranes are dry Neck: Supple, no JVD  Cardiovascular: Regular rate and rhythm, S1-S2  Respiratory: Clear to auscultation bilaterally  Abdomen: Soft, distended, nontender, few bowel sounds, feeding tube noted midline  Skin: Other than feeding tube placement, no skin breaks, tears or lesions  Musculoskeletal: No clubbing or cyanosis, trace pitting edema bilaterally Psychiatric: Appropriate, no evidence of psychoses  Neurologic: No focal deficits           Labs on Admission:  Basic Metabolic Panel: Recent Labs  Lab 03/05/17 1102 03/06/17 0411 03/07/17 0500 03/08/17 0451 03/09/17 0428 03/11/17 1227  NA 136 136  --  140 140 138  K 4.0 3.8  --  3.1* 4.1 3.7  CL  --  110  --  112* 113* 107  CO2 22 22  --  22 23 24   GLUCOSE 142* 212*  --  121* 173* 110*  BUN 15.4 13  --  15 16 16   CREATININE 1.0 0.97 0.90 0.72 0.72 0.91  CALCIUM 8.3* 7.4*  --  7.5* 7.9* 8.1*  MG  --   --   --  1.9 2.4  --    Liver Function Tests: Recent Labs  Lab 03/05/17 1102 03/08/17 0451 03/09/17 0428  AST 36* 20 23  ALT 309* 78* 67*  ALKPHOS 51 31* 34*  BILITOT 1.36* 0.4 0.5  PROT 6.0* 5.0* 5.5*  ALBUMIN 2.4* 1.8* 2.0*   No results for input(s): LIPASE, AMYLASE in the last 168 hours. No results for input(s): AMMONIA in the last 168 hours. CBC: Recent Labs  Lab 03/06/17 0411 03/07/17 0500 03/08/17 0451 03/08/17 2011 03/09/17 0428 03/11/17 1227  WBC 0.1* 0.2* 0.5*  --  2.3* 6.9  NEUTROABS  --   --  0.2*  --  1.7 5.4  HGB 7.1* 7.3* 6.5* 8.7* 8.4* 8.4*  HCT 21.0* 22.0* 20.1* 26.0* 25.3* 25.4*  MCV 86.8 86.6 85.5  --  87.2 87.9  PLT 32* 27* 26*  --  58* 198   Cardiac Enzymes: No results for input(s): CKTOTAL, CKMB, CKMBINDEX, TROPONINI in the last 168 hours.  BNP (last 3 results) No results for input(s): BNP in the last 8760 hours.  ProBNP (last 3 results) No results for input(s): PROBNP  in the last 8760 hours.  CBG: Recent Labs  Lab  03/08/17 2007 03/09/17 0007 03/09/17 0401 03/09/17 0737 03/09/17 1214  GLUCAP 152* 134* 158* 160* 120*    Radiological Exams on Admission: Ct Head Wo Contrast  Result Date: 03/11/2017 CLINICAL DATA:  Fall, hit back of head EXAM: CT HEAD WITHOUT CONTRAST TECHNIQUE: Contiguous axial images were obtained from the base of the skull through the vertex without intravenous contrast. COMPARISON:  None. FINDINGS: Brain: There is extra-axial blood noted along the left side of the falx, measuring 9 mm in thickness, likely subdural blood. No significant mass effect. No midline shift. No hydrocephalus or acute infarction. Vascular: No hyperdense vessel or unexpected calcification. Skull: No acute calvarial abnormality. Sinuses/Orbits: Air-fluid levels noted in the left maxillary and left sphenoid sinuses. Mastoid air cells are clear. Orbital soft tissues unremarkable. Other: None IMPRESSION: Extra-axial blood along the left side of the falx measuring 9 mm compatible with small parafalcine subdural hematoma. No significant mass effect or midline shift. Air-fluid levels in the left maxillary and sphenoid sinuses may reflect acute sinusitis. Critical Value/emergent results were called by telephone at the time of interpretation on 03/11/2017 at 11:17 am to Dr. Virgel Manifold , who verbally acknowledged these results. Electronically Signed   By: Rolm Baptise M.D.   On: 03/11/2017 11:19    EKG: Not done  Assessment/Plan Present on Admission: . Subdural hematoma following fall on patient on xarelto status post KCentra to reverse effects. Monitor overnight in ICU. Transfer to [facility] for closer proximity of neurosurgery if needed. At this time, no neurosurgical intervention. Repeat head CT in the morning and discuss with neurosurgery as to when patient would be cleared to resume by mouth, discharge and resume xarelto.  Morbid obesity ([place]): Meets criteria BMI greater than 40.  . Essential hypertension: Blood  pressure stable. Not on blood pressure medications at home. IBC having higher blood pressures here during hospitalization following fall and bleed. When necessary hydralazine  . 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 On chemotherapy. Currently not neutropenic. Receives steroids on days surrounding chemotherapy, has not been on chemotherapy in almost 2 weeks. No need for additional steroids at this time. Normally on Osmolite. Holding tube feeds until follow-up CT clears her.   . History of recently diagnosed pulmonary embolus on xarelto.  Will obviously be stopping xarelto for now, we'll discuss with neurosurgery when it is safe to resume. Reviewed lower extremity Dopplers done 10/30 and those were negative for DVT. Likely in upper extremity, however then there is no indication for placement of IVC filter. Patient will be at risk for additional pulmonary embolus.  . Hypothyroidism: Continue Synthroid  Left maxillary sinusitis: Antonelli noted on head CT. Given patient is on chemotherapy, would go ahead and prophylactically treat this when she is able take by mouth and she can get a prescription for 10 days of Augmentin.  Marland KitchenHyperglycemia: No history of diabetes. Noted elevated blood sugars. Is on intermittent prednisone. We'll check hemoglobin A1c.  DVT prophylaxis: SCDs   Code Status: Full code as confirmed by patient   Family Communication: Daughter and husband at the bedside   Disposition Plan: Potential discharge tomorrow if cleared by neurosurgery if repeat CT normal   Consults called: Arnoldo Morale, neurosurgery   Admission status: Given potential for discharge, placed under observation     Annita Brod MD [facility] Pager [phone]  If 7PM-7AM, please contact night-coverage [email or web address]  Password [email or web address]  03/11/2017, 5:32 PM

## 2017-03-11 NOTE — ED Notes (Signed)
[facility] arrived  

## 2017-03-11 NOTE — ED Notes (Signed)
Bed: YT46 Expected date:  Expected time:  Means of arrival:  Comments: Fall

## 2017-03-11 NOTE — ED Notes (Addendum)
Pt is alert and oriented x 4 and is verbally responsive. Pt has family in the room. Pt has red bump/contusion to back of her head no opened wounds/ bleeding  or lacerations are noted,

## 2017-03-11 NOTE — ED Provider Notes (Signed)
[facility] DEPT Provider Note   CSN: [id number] Arrival date & time: 03/11/17  6629     History   Chief Complaint Chief Complaint  Patient presents with  . Head Injury    HPI Kristen Escobar is a 71 y.o. female.  HPI   71 year old female presenting after fall.  Patient was trying to step up over a curb when she lost her balance and fell backwards.  She struck the back of her head.  There is no loss of consciousness.  She denies any other injuries.  She actually denies any significant headache currently.  No acute neurological complaints. She is on Xarelto for pulmonary embolism which was diagnosed last month.  Past Medical History:  Diagnosis Date  . Arthritis   . Cancer ([place]) 03/2009   breast- rt  . GERD (gastroesophageal reflux disease)   . History of radiation therapy 07/12/10,completed   right breast 60 Gy x30 fx  . Hypertension   . Hypothyroidism   . Obesity   . Peripheral vascular disease ([place]) 1995   PT DEVELOPED CIRCULATION PROBLEMS IN BOTH HANDS AND GANGRENE OF BOTH INDEX FINGERS--REQUIRING AMPUTATION OF THE INDEX FINGERS AND VASCULAR SURGERY.  PT TOLD HER PROBLEMS RELATED TO SMOKING.   NO OTHER PROBLEMS SINCE.  Marland Kitchen Pneumonia   . Thyroid mass     Patient Active Problem List   Diagnosis Date Noted  . Neutropenic fever ([place]) 03/05/2017  . 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 Pulmonary embolism ([place]) 02/22/2017  . AKI (acute kidney injury) ([place]) 02/16/2017  . Acute on chronic respiratory failure with hypoxia ([place]) 02/16/2017  . Acute respiratory failure with hypoxia ([place]) 02/16/2017  . Port or reservoir infection 01/20/2017  . Port-A-Cath in place 01/20/2017  . 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 Neck mass 12/26/2016  . Esophageal mass 11/27/2016  . Hypothyroidism 03/25/2016  . Bilateral carotid bruits 08/22/2015  . Impaired glucose tolerance 08/22/2015  . Chronic kidney disease 08/22/2015  .  Benign paroxysmal positional vertigo 02/05/2013  . S/P left THA, AA 01/20/2012  . Osteoarthritis 09/24/2011  . Breast cancer of upper-outer quadrant of right female breast ([place]) 03/21/2011  . Morbid obesity ([place]) 08/02/2009  . Essential hypertension 08/02/2009    Past Surgical History:  Procedure Laterality Date  . AMPUTATION     partial amputation of both index fingers  . BREAST SURGERY  2011   lumpectomy with node sampling- RIGHT  . COLONOSCOPY    . ESOPHAGOGASTRODUODENOSCOPY    . EXCISION MASS NECK Left 12/26/2016   Procedure: EXCISION MASS NECK;  Surgeon: Izora Gala, MD;  Location: [facility];  Service: ENT;  Laterality: Left;  open excision of thyroid mass left side with frozen section  . HAND SURGERY Bilateral 1995   Amputaed pointer fingers bilaterally  . IR FLUORO GUIDE PORT INSERTION RIGHT  01/14/2017  . IR GASTROSTOMY TUBE MOD SED  02/19/2017  . IR REPLC GASTRO/COLONIC TUBE PERCUT W/FLUORO  02/25/2017  . IR [place] TUBE PERCUT W/FLUORO  03/09/2017  . IR US GUIDE VASC ACCESS RIGHT  01/14/2017  . TOTAL HIP ARTHROPLASTY  12/16/2011   right hip  . TOTAL HIP ARTHROPLASTY  01/20/2012   Procedure: TOTAL HIP ARTHROPLASTY ANTERIOR APPROACH;  Surgeon: Mauri Pole, MD;  Location: [facility];  Service: Orthopedics;  Laterality: Left;  . TUBAL LIGATION    . VASCULAR SURGERY     both hands    OB History    No data available  Home Medications    Prior to Admission medications   Medication Sig Start Date End Date Taking? Authorizing Provider  allopurinol (ZYLOPRIM) 300 MG tablet Take 1 tablet (300 mg total) by mouth daily. Patient taking differently: Place 300 mg into feeding tube daily.  01/01/17  Yes Nicholas Lose, MD  levothyroxine (SYNTHROID, LEVOTHROID) 50 MCG tablet Take 1 tablet (50 mcg total) daily by mouth. Patient taking differently: Place 50 mcg into feeding tube daily.  03/02/17  Yes Marletta Lor, MD  loperamide (IMODIUM A-D) 2 MG tablet Place 2 mg  into feeding tube 4 (four) times daily as needed for diarrhea or loose stools.   Yes [provider]  omeprazole (PRILOSEC) 20 MG capsule Take 20 mg daily by mouth.   Yes [provider]  ondansetron (ZOFRAN) 8 MG tablet Take 1 tablet (8 mg total) every 8 (eight) hours as needed by mouth for refractory nausea / vomiting. Start day 3 after chemo. Patient taking differently: Place 8 mg into feeding tube every 8 (eight) hours as needed for refractory nausea / vomiting. Start day 3 after chemo. 03/04/17  Yes Tanner, Lyndon Code., PA-C  predniSONE (DELTASONE) 20 MG tablet Take 3 tablets (60 mg total) by mouth daily. Take on days 1-5 of chemotherapy. 01/01/17  Yes Nicholas Lose, MD  Rivaroxaban (XARELTO STARTER PACK) 15 & 20 MG TBPK Place 15 mg into feeding tube 2 (two) times daily. Take as directed on package: Start with one 15mg  tablet by mouth twice a day with food. On Day 22, switch to one 20mg  tablet once a day with food.   Yes [provider]  LORazepam (ATIVAN) 0.5 MG tablet Take 1 tablet (0.5 mg total) by mouth every 6 (six) hours as needed (Nausea or vomiting). Patient taking differently: Place 0.5 mg into feeding tube every 6 (six) hours as needed (Nausea or vomiting).  01/01/17   Nicholas Lose, MD  prochlorperazine (COMPAZINE) 10 MG tablet Take 1 tablet (10 mg total) by mouth every 6 (six) hours as needed (Nausea or vomiting). Patient taking differently: Place 10 mg into feeding tube every 6 (six) hours as needed (Nausea or vomiting).  01/01/17   Nicholas Lose, MD  rivaroxaban (XARELTO) 20 MG TABS tablet Take 1 tablet (20 mg total) daily with supper by mouth. 03/09/17   Janece Canterbury, MD  saccharomyces boulardii (FLORASTOR) 250 MG capsule Take 1 capsule (250 mg total) 2 (two) times daily by mouth. Patient not taking: Reported on 03/11/2017 02/23/17   Patrecia Pour, MD  sucralfate (CARAFATE) 1 g tablet Take 1 tablet (1 g total) 4 (four) times daily -  with meals and at bedtime by  mouth. 03/04/17   Harle Stanford., PA-C    Family History Family History  Problem Relation Age of Onset  . Cancer Mother        pt unaware of what kind  . Hearing loss Mother   . Coronary artery disease Father   . Diabetes Father   . Coronary artery disease Brother   . Coronary artery disease Brother   . Diabetes Sister   . Cancer Sister        breast  . Colon cancer Neg Hx   . Stomach cancer Neg Hx   . Rectal cancer Neg Hx   . Esophageal cancer Neg Hx     Social History Social History   Tobacco Use  . Smoking status: Former Smoker    Packs/day: 1.00    Years: 20.00  Pack years: 20.00    Last attempt to quit: 04/21/1993    Years since quitting: 23.9  . Smokeless tobacco: Never Used  Substance Use Topics  . Alcohol use: Yes    Comment: OCCAS - MAYBE ONCE A MONTH  . Drug use: No     Allergies   Patient has no known allergies.   Review of Systems Review of Systems  All systems reviewed and negative, other than as noted in HPI.  Physical Exam Updated Vital Signs BP 115/61 (BP Location: Left Arm)   Pulse 94   Temp 98.9 F (37.2 C) (Oral)   Resp 16   SpO2 95%   Physical Exam  Constitutional: She is oriented to person, place, and time. She appears well-developed and well-nourished. No distress.  HENT:  Head: Normocephalic.  Small hematoma near crown of head  Eyes: Conjunctivae are normal. Pupils are equal, round, and reactive to light. Right eye exhibits no discharge. Left eye exhibits no discharge.  Neck: Neck supple.  No midline spinal tenderness  Cardiovascular: Normal rate, regular rhythm and normal heart sounds. Exam reveals no gallop and no friction rub.  No murmur heard. Pulmonary/Chest: Effort normal and breath sounds normal. No respiratory distress.  Abdominal: Soft. She exhibits no distension. There is no tenderness.  Musculoskeletal: She exhibits no edema or tenderness.  Neurological: She is alert and oriented to person, place, and time. No  cranial nerve deficit. She exhibits normal muscle tone. Coordination normal.  Skin: Skin is warm and dry.  Psychiatric: She has a normal mood and affect. Her behavior is normal. Thought content normal.  Nursing note and vitals reviewed.    ED Treatments / Results  Labs (all labs ordered are listed, but only abnormal results are displayed) Labs Reviewed - No data to display  EKG  EKG Interpretation None       Radiology Ct Head Wo Contrast  Result Date: 03/11/2017 CLINICAL DATA:  Fall, hit back of head EXAM: CT HEAD WITHOUT CONTRAST TECHNIQUE: Contiguous axial images were obtained from the base of the skull through the vertex without intravenous contrast. COMPARISON:  None. FINDINGS: Brain: There is extra-axial blood noted along the left side of the falx, measuring 9 mm in thickness, likely subdural blood. No significant mass effect. No midline shift. No hydrocephalus or acute infarction. Vascular: No hyperdense vessel or unexpected calcification. Skull: No acute calvarial abnormality. Sinuses/Orbits: Air-fluid levels noted in the left maxillary and left sphenoid sinuses. Mastoid air cells are clear. Orbital soft tissues unremarkable. Other: None IMPRESSION: Extra-axial blood along the left side of the falx measuring 9 mm compatible with small parafalcine subdural hematoma. No significant mass effect or midline shift. Air-fluid levels in the left maxillary and sphenoid sinuses may reflect acute sinusitis. Critical Value/emergent results were called by telephone at the time of interpretation on 03/11/2017 at 11:17 am to Dr. Virgel Manifold , who verbally acknowledged these results. Electronically Signed   By: Rolm Baptise M.D.   On: 03/11/2017 11:19   Ir Replc Gastro/colonic Tube Percut W/fluoro  Result Date: 03/09/2017 CLINICAL DATA:  71 year old with history of lymphoma and tracheoesophageal fistula. Percutaneous gastrostomy tube was placed on 02/19/2017. The tube has already been replaced  once due to partial dislodgement. Patient is again having problems with the tube due to excessive leakage around the tube. EXAM: GASTROSTOMY CATHETER REPLACEMENT WITH FLUOROSCOPY Physician: Stephan Minister. Henn, MD FLUOROSCOPY TIME:  1 minute and 9 seconds, 58.6 mGy MEDICATIONS: 25 mcg fentanyl ANESTHESIA/SEDATION: None PROCEDURE: The procedure was  explained to the patient. The risks and benefits of the procedure were discussed and the patient's questions were addressed. Informed consent was obtained from the patient. The abdomen and existing tube were prepped and draped in a sterile fashion. Maximal barrier sterile technique was utilized including caps, mask, sterile gowns, sterile gloves, sterile drape, hand hygiene and skin antiseptic. Contrast was injected through the existing tube. A stiff Amplatz wire was advanced into the stomach and the balloon was deflated. The tube was easily removed over the wire. A new 18 French balloon retention tube was placed and the balloon was inflated with 10 mL of saline. Contrast injection confirmed placement in the stomach. However, the retention disc was loose and I was concerned that the tube would continue leak. Therefore, the balloon was deflated and removed over a stiff Amplatz wire. A new 20 French balloon retention gastrostomy tube was placed and balloon was inflated with 10 mL of saline. Contrast injection confirmed placement in the stomach. No evidence of leakage during injection. Fluoroscopic images were taken and saved for this procedure. FINDINGS: The existing 85 French tube was partially dislodged within the soft tissues. However, there was still a patent tract to the stomach. A new 20 French gastrostomy tube is well positioned in the stomach and the balloon was inflated with 10 mL of saline. No evidence for leakage during follow-up injections. COMPLICATIONS: None IMPRESSION: Successful exchange and up sizing of the percutaneous gastrostomy tube. The patient now has a 71  [place] balloon retention gastrostomy tube. Tube is ready to be used. Electronically Signed   By: Markus Daft M.D.   On: 03/09/2017 13:59    Procedures Procedures (including critical care time)  CRITICAL CARE Performed by: Virgel Manifold Total critical care time: 35 minutes, re: head bleed, emergent need for reversal of anticoagulation Critical care time was exclusive of separately billable procedures and treating other patients. Critical care was necessary to treat or prevent imminent or life-threatening deterioration. Critical care was time spent personally by me on the following activities: development of treatment plan with patient and/or surrogate as well as nursing, discussions with consultants, evaluation of patient's response to treatment, examination of patient, obtaining history from patient or surrogate, ordering and performing treatments and interventions, ordering and review of laboratory studies, ordering and review of radiographic studies, pulse oximetry and re-evaluation of patient's condition.   Medications Ordered in ED Medications - No data to display   Initial Impression / Assessment and Plan / ED Course  I have reviewed the triage vital signs and the nursing notes.  Pertinent labs & imaging results that were available during my care of the patient were reviewed by me and considered in my medical decision making (see chart for details).  Clinical Course as of Mar 11 1609  Wed Mar 11, 2017  1213 Discussed with Dr Arnoldo Morale, neurosurgery. No acute neurosurgical interventions. Would repeat CT tomorrow unless develops new symptoms in the interim.   [SK]    Clinical Course User Index [SK] Virgel Manifold, MD    71yF s/p fall. Head CT with small bleed. On xarelto after diagnosed with PE on 10/29.  May be some confusion with dosing. Was held temporarily because of thrombocytopenia and then resumed when improved. Platelets 58,000 on 11/19.  Is supposed to be taking daily dosing  but husband says taking BID and specifically mentioned 15mg  tablets.   Will reverse. May need platelets as well. CBC pending. Will discuss with neurosurgery. Need for IVC filter?   Final Clinical  Impressions(s) / ED Diagnoses   Final diagnoses:  Intracranial hemorrhage [facility])  On rivaroxaban therapy    ED Discharge Orders    None       Virgel Manifold, MD 03/12/17 [phone]

## 2017-03-11 NOTE — Telephone Encounter (Signed)
Unable to reach patient at time of TCM Call. Left message for patient to return call when available.  

## 2017-03-11 NOTE — Progress Notes (Signed)
[facility] Progress Note   Kristen Escobar [id number] 1945-11-08 71 y.o.  Kristen Escobar is managed by Kristen Escobar  Actively treated with chemotherapy: yes  Current Therapy: R [facility]OP  Last Treated: 02/27/2017  Assessment: Plan:    Blunt head trauma, initial encounter   Blunt head trauma: The patient was transported to the emergency room for management.  Please see After Visit Summary for patient specific instructions.  Future Appointments  Date Time Provider [place]  03/20/2017  8:30 AM [facility] LAB 5 [facility] None  03/20/2017  8:45 AM [facility] J32 DNS [facility] None  03/20/2017  9:15 AM Nicholas Lose, MD [facility] None  03/20/2017 10:15 AM [facility] C10 [facility] None  03/20/2017 11:15 AM Karie Mainland, RD [facility] None  03/24/2017  8:45 AM Marletta Lor, MD [facility]  08/04/2017  8:30 AM Nicholas Lose, MD [facility] None    No orders of the defined types were placed in this encounter.      Subjective:   Patient ID:  Kristen Escobar is a 71 y.o. (DOB Apr 03, 1946) female.  Chief Complaint: No chief complaint on file.   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  She has a PEG tube for nutritional support.  She has been vomiting after her feedings.  She presents to the office today for evaluation of this.  As she was getting out of her vehicle in front of the cancer center this morning.  She went to step up onto a raised area between the 2 driveways.  She fell backwards and hit the upper portion of her occipital skull.  She developed a large area of's swelling consistent with a hematoma.  She has a recent history of a PE and has been taking Xarelto.  Also it was noted on her labs that her albumin was 2.0 recently.  She denies any loss  of consciousness or mental status changes.  She is having no weakness.  She was transported to the emergency room for management.  Medications: I have reviewed the patient's current medications.  Allergies: No Known Allergies  Past Medical History:  Diagnosis Date  . Arthritis   . Cancer ([place]) 03/2009   breast- rt  . GERD (gastroesophageal reflux disease)   . History of radiation therapy 07/12/10,completed   right breast 60 Gy x30 fx  . Hypertension   . Hypothyroidism   . Obesity   . Peripheral vascular disease ([place]) 1995   PT DEVELOPED CIRCULATION PROBLEMS IN BOTH HANDS AND GANGRENE OF BOTH INDEX FINGERS--REQUIRING AMPUTATION OF THE INDEX FINGERS AND VASCULAR SURGERY.  PT TOLD HER PROBLEMS RELATED TO SMOKING.   NO OTHER PROBLEMS SINCE.  Marland Kitchen Pneumonia   . Thyroid mass     Past Surgical History:  Procedure Laterality Date  . AMPUTATION     partial amputation of both index fingers  . BREAST SURGERY  2011   lumpectomy with node sampling- RIGHT  . COLONOSCOPY    . ESOPHAGOGASTRODUODENOSCOPY    . EXCISION MASS NECK Left 12/26/2016   Procedure: EXCISION MASS NECK;  Surgeon: Izora Gala, MD;  Location: [place];  Service: ENT;  Laterality: Left;  open excision of thyroid mass left side with frozen section  . HAND SURGERY Bilateral 1995   Amputaed pointer fingers bilaterally  . IR FLUORO GUIDE PORT INSERTION RIGHT  01/14/2017  .  IR GASTROSTOMY TUBE MOD SED  02/19/2017  . IR REPLC GASTRO/COLONIC TUBE PERCUT W/FLUORO  02/25/2017  . IR [place] TUBE PERCUT W/FLUORO  03/09/2017  . IR US GUIDE VASC ACCESS RIGHT  01/14/2017  . TOTAL HIP ARTHROPLASTY  12/16/2011   right hip  . TOTAL HIP ARTHROPLASTY  01/20/2012   Procedure: TOTAL HIP ARTHROPLASTY ANTERIOR APPROACH;  Surgeon: Mauri Pole, MD;  Location: [facility];  Service: Orthopedics;  Laterality: Left;  . TUBAL LIGATION    . VASCULAR SURGERY     both hands    Family History  Problem Relation Age of Onset  . Cancer Mother         pt unaware of what kind  . Hearing loss Mother   . Coronary artery disease Father   . Diabetes Father   . Coronary artery disease Brother   . Coronary artery disease Brother   . Diabetes Sister   . Cancer Sister        breast  . Colon cancer Neg Hx   . Stomach cancer Neg Hx   . Rectal cancer Neg Hx   . Esophageal cancer Neg Hx     Social History   Socioeconomic History  . Marital status: Married    Spouse name: Not on file  . Number of children: Not on file  . Years of education: Not on file  . Highest education level: Not on file  Social Needs  . Financial resource strain: Not on file  . Food insecurity - worry: Not on file  . Food insecurity - inability: Not on file  . Transportation needs - medical: Not on file  . Transportation needs - non-medical: Not on file  Occupational History  . Not on file  Tobacco Use  . Smoking status: Former Smoker    Packs/day: 1.00    Years: 20.00    Pack years: 20.00    Last attempt to quit: 04/21/1993    Years since quitting: 23.9  . Smokeless tobacco: Never Used  Substance and Sexual Activity  . Alcohol use: Yes    Comment: OCCAS - MAYBE ONCE A MONTH  . Drug use: No  . Sexual activity: Yes  Other Topics Concern  . Not on file  Social History Narrative  . Not on file    Past Medical History, Surgical history, Social history, and Family history were reviewed and updated as appropriate.   Please see review of systems for further details on the patient's review from today.   Review of Systems:  Review of Systems  Skin: Positive for wound.       Large raised area over the superior sagittal skull after a fall.  Neurological: Positive for weakness. Negative for seizures, syncope, facial asymmetry, speech difficulty and light-headedness.  Psychiatric/Behavioral: Negative for agitation, confusion and decreased concentration.    Objective:   Physical Exam:  BP (!) 136/49 (BP Location: Right Arm, Patient Position: Sitting)    Pulse 91   Temp 99.2 F (37.3 C) (Oral)   Resp 17   SpO2 94%  ECOG: 2  Physical Exam  Constitutional: No distress.  The patient is an elderly chronically ill-appearing female who was initially found landing on her back lying on her back on the driveway in front of the cancer center.  HENT:  Head:    Neurological: She is alert. Coordination (The patient is ambulating with a wheelchair.) abnormal.  Skin: She is not diaphoretic.  Psychiatric: She has a normal mood and affect.  Her behavior is normal. Judgment and thought content normal.    Lab Review:     Component Value Date/Time   NA 138 03/11/2017 1227   NA 136 03/05/2017 1102   K 3.7 03/11/2017 1227   K 4.0 03/05/2017 1102   CL 107 03/11/2017 1227   CL 105 03/10/2012 0859   CO2 24 03/11/2017 1227   CO2 22 03/05/2017 1102   GLUCOSE 110 (H) 03/11/2017 1227   GLUCOSE 142 (H) 03/05/2017 1102   GLUCOSE 149 (H) 03/10/2012 0859   BUN 16 03/11/2017 1227   BUN 15.4 03/05/2017 1102   CREATININE 0.91 03/11/2017 1227   CREATININE 1.0 03/05/2017 1102   CALCIUM 8.1 (L) 03/11/2017 1227   CALCIUM 8.3 (L) 03/05/2017 1102   PROT 5.5 (L) 03/09/2017 0428   PROT 6.0 (L) 03/05/2017 1102   ALBUMIN 2.0 (L) 03/09/2017 0428   ALBUMIN 2.4 (L) 03/05/2017 1102   AST 23 03/09/2017 0428   AST 36 (H) 03/05/2017 1102   ALT 67 (H) 03/09/2017 0428   ALT 309 (HH) 03/05/2017 1102   ALKPHOS 34 (L) 03/09/2017 0428   ALKPHOS 51 03/05/2017 1102   BILITOT 0.5 03/09/2017 0428   BILITOT 1.36 (H) 03/05/2017 1102   GFRNONAA >60 03/11/2017 1227   GFRAA >60 03/11/2017 1227       Component Value Date/Time   WBC 6.9 03/11/2017 1227   RBC 2.89 (L) 03/11/2017 1227   HGB 8.4 (L) 03/11/2017 1227   HGB 8.3 (L) 03/05/2017 1102   HCT 25.4 (L) 03/11/2017 1227   HCT 26.1 (L) 03/05/2017 1102   PLT 198 03/11/2017 1227   PLT 64 (L) 03/05/2017 1102   MCV 87.9 03/11/2017 1227   MCV 87.9 03/05/2017 1102   MCH 29.1 03/11/2017 1227   MCHC 33.1 03/11/2017 1227    RDW 18.4 (H) 03/11/2017 1227   RDW 18.4 (H) 03/05/2017 1102   LYMPHSABS PENDING 03/11/2017 1227   LYMPHSABS 0.7 (L) 02/27/2017 0802   MONOABS PENDING 03/11/2017 1227   MONOABS 1.4 (H) 02/27/2017 0802   EOSABS PENDING 03/11/2017 1227   EOSABS 0.1 02/27/2017 0802   EOSABS 0.4 05/02/2009 1517   BASOSABS PENDING 03/11/2017 1227   BASOSABS 0.1 02/27/2017 0802   -------------------------------  Imaging from last 24 hours (if applicable):  Radiology interpretation: Dg Chest 2 View  Result Date: 03/05/2017 CLINICAL DATA:  Productive cough, fever. EXAM: CHEST  2 VIEW COMPARISON:  Radiographs of February 24, 2017. FINDINGS: The heart size and mediastinal contours are within normal limits. Atherosclerosis of thoracic aorta is noted. No pneumothorax or pleural effusion is noted. Right internal jugular Port-A-Cath is unchanged in position. Mild central pulmonary vascular congestion is noted. Mild right basilar opacity is noted concerning for possible atelectasis or infiltrate. The visualized skeletal structures are unremarkable. IMPRESSION: Aortic atherosclerosis. Mild right basilar subsegmental atelectasis or infiltrate is noted. Electronically Signed   By: Marijo Conception, M.D.   On: 03/05/2017 10:34   Dg Chest 2 View  Result Date: 02/24/2017 CLINICAL DATA:  Fever and cough. Patient just released from hospitalization for esophageal fistula. EXAM: CHEST  2 VIEW COMPARISON:  02/16/2017 FINDINGS: Stable position and injectable right-sided port. Stable postsurgical changes in the right chest wall. Cardiomediastinal silhouette is normal. Mediastinal contours appear intact. Calcific atherosclerotic disease of the aorta and tortuosity. There is no evidence of focal airspace consolidation, pleural effusion or pneumothorax. Low lung volumes. Osseous structures are without acute abnormality. Soft tissues are grossly normal. IMPRESSION: No active cardiopulmonary disease. Electronically Signed  By: Fidela Escobar M.D.   On: 02/24/2017 19:41   Dg Chest 2 View  Result Date: 02/16/2017 CLINICAL DATA:  Dyspnea on exertion. Hypoxia. Progressive shortness breath for 2 days. Esophageal cancer. EXAM: CHEST  2 VIEW COMPARISON:  Chest film associated with Port-A-Cath insertion 01/14/2017. PET scan 12/08/2016. FINDINGS: Heart size is normal. Lung volumes are low. There is no edema or effusion. No focal airspace disease present. Aortic atherosclerosis is noted. A right IJ Port-A-Cath is stable. Surgical clips are noted in the right breast and axilla. The upper abdomen is unremarkable. IMPRESSION: 1. No acute cardiopulmonary disease. 2. Low lung volumes. Electronically Signed   By: [place] Escobar M.D.   On: 02/16/2017 13:05   Ct Head Wo Contrast  Result Date: 03/11/2017 CLINICAL DATA:  Fall, hit back of head EXAM: CT HEAD WITHOUT CONTRAST TECHNIQUE: Contiguous axial images were obtained from the base of the skull through the vertex without intravenous contrast. COMPARISON:  None. FINDINGS: Brain: There is extra-axial blood noted along the left side of the falx, measuring 9 mm in thickness, likely subdural blood. No significant mass effect. No midline shift. No hydrocephalus or acute infarction. Vascular: No hyperdense vessel or unexpected calcification. Skull: No acute calvarial abnormality. Sinuses/Orbits: Air-fluid levels noted in the left maxillary and left sphenoid sinuses. Mastoid air cells are clear. Orbital soft tissues unremarkable. Other: None IMPRESSION: Extra-axial blood along the left side of the falx measuring 9 mm compatible with small parafalcine subdural hematoma. No significant mass effect or midline shift. Air-fluid levels in the left maxillary and sphenoid sinuses may reflect acute sinusitis. Critical Value/emergent results were called by telephone at the time of interpretation on 03/11/2017 at 11:17 am to Dr. Virgel Manifold , who verbally acknowledged these results. Electronically Signed   By:  Rolm Baptise M.D.   On: 03/11/2017 11:19   Ir Gastrostomy Tube Mod Sed  Result Date: 02/19/2017 INDICATION: 71 year old with lymphoma and tracheoesophageal fistula. Patient needs a gastrostomy tube in order to avoid the fistula. EXAM: PERCUTANEOUS GASTROSTOMY TUBE WITH FLUOROSCOPIC GUIDANCE Physician: Stephan Escobar. Anselm Pancoast, MD MEDICATIONS: Ancef 2 g; Antibiotics were administered within 1 hour of the procedure. Glucagon 0.5 mg IV ANESTHESIA/SEDATION: Versed 2.0 mg IV; Fentanyl 100 mcg IV Moderate Sedation Time:  37 minutes The patient was continuously monitored during the procedure by the interventional radiology nurse under my direct supervision. FLUOROSCOPY TIME:  Fluoroscopy Time: 5 minutes 6 seconds (98 mGy). COMPLICATIONS: None immediate. PROCEDURE: Informed consent was obtained for a percutaneous gastrostomy tube. The patient was placed on the interventional table. Fluoroscopy demonstrated gas in the transverse colon. An orogastric tube was placed with fluoroscopic guidance without complication. The anterior abdomen was prepped and draped in sterile fashion. Maximal barrier sterile technique was utilized including caps, mask, sterile gowns, sterile gloves, sterile drape, hand hygiene and skin antiseptic. Stomach was inflated with air through the orogastric tube. The skin and subcutaneous tissues were anesthetized with 1% lidocaine. A total of three SAF-T-[id number] T-fasteners were placed in the stomach using fluoroscopic guidance. Small incision was made between the T-fasteners. Needle was directed into the stomach with fluoroscopic guidance between the T-fasteners. A small amount of contrast was injected to confirm placement in the stomach. Wire was advanced into the stomach. The tract was dilated to accommodate an 18 French peel-away sheath. A 16 French Entuit gastrostomy tube was easily advanced over the wire and a peel-away sheath was removed. The balloon was inflated with 6 mL of saline. Contrast injection  confirmed placement in  the stomach. The gastrostomy tube was flushed with normal saline. Fluoroscopic images were taken and saved for this procedure. IMPRESSION: Successful fluoroscopic guided percutaneous gastrostomy tube placement. Electronically Signed   By: Markus Escobar M.D.   On: 02/19/2017 18:49   Nm Pulmonary Perf And Vent  Addendum Date: 02/16/2017   ADDENDUM REPORT: 02/16/2017 20:22 ADDENDUM: Initial attempts to contact Hospitalists by pager at [place]. Ultimately, Critical Value/emergent results were called by telephone at the time of interpretation on 02/16/2017 at 8:20 pm to Nurse [facility], who verbally acknowledged these results. Electronically Signed   By: Suzy Bouchard M.D.   On: 02/16/2017 20:22   Result Date: 02/16/2017 CLINICAL DATA:  Elevated D-dimer. Esophageal carcinoma. Elevated white count. Short of breath. EXAM: NUCLEAR MEDICINE VENTILATION - PERFUSION LUNG SCAN TECHNIQUE: Ventilation images were obtained in multiple projections using inhaled aerosol Tc-30m DTPA. Perfusion images were obtained in multiple projections after intravenous injection of Tc-53m MAA. RADIOPHARMACEUTICALS:  28.8 mCi Technetium-35m DTPA aerosol inhalation and 4.1 mCi Technetium-60m MAA IV COMPARISON:  Chest radiograph 02/16/2017 FINDINGS: Ventilation: Informed ventilation per Perfusion: There are subtle wedge-shaped perfusion defects in the LEFT RIGHT upper lobe. Smaller defects at the LEFT and RIGHT lung base posteriorly. These peripheral perfusion defects are unmatched to ventilation. IMPRESSION: Bilateral un matched wedge-shaped peripheral perfusion defects most consistent with acute pulmonary embolism. Electronically Signed: By: Suzy Bouchard M.D. On: 02/16/2017 19:50   Ir Replc Gastro/colonic Tube Percut W/fluoro  Result Date: 03/09/2017 CLINICAL DATA:  71 year old with history of lymphoma and tracheoesophageal fistula. Percutaneous gastrostomy tube was placed on 02/19/2017. The tube has already  been replaced once due to partial dislodgement. Patient is again having problems with the tube due to excessive leakage around the tube. EXAM: GASTROSTOMY CATHETER REPLACEMENT WITH FLUOROSCOPY Physician: Stephan Escobar. Henn, MD FLUOROSCOPY TIME:  1 minute and 9 seconds, 58.6 mGy MEDICATIONS: 25 mcg fentanyl ANESTHESIA/SEDATION: None PROCEDURE: The procedure was explained to the patient. The risks and benefits of the procedure were discussed and the patient's questions were addressed. Informed consent was obtained from the patient. The abdomen and existing tube were prepped and draped in a sterile fashion. Maximal barrier sterile technique was utilized including caps, mask, sterile gowns, sterile gloves, sterile drape, hand hygiene and skin antiseptic. Contrast was injected through the existing tube. A stiff Amplatz wire was advanced into the stomach and the balloon was deflated. The tube was easily removed over the wire. A new 18 French balloon retention tube was placed and the balloon was inflated with 10 mL of saline. Contrast injection confirmed placement in the stomach. However, the retention disc was loose and I was concerned that the tube would continue leak. Therefore, the balloon was deflated and removed over a stiff Amplatz wire. A new 20 French balloon retention gastrostomy tube was placed and balloon was inflated with 10 mL of saline. Contrast injection confirmed placement in the stomach. No evidence of leakage during injection. Fluoroscopic images were taken and saved for this procedure. FINDINGS: The existing 48 French tube was partially dislodged within the soft tissues. However, there was still a patent tract to the stomach. A new 20 French gastrostomy tube is well positioned in the stomach and the balloon was inflated with 10 mL of saline. No evidence for leakage during follow-up injections. COMPLICATIONS: None IMPRESSION: Successful exchange and up sizing of the percutaneous gastrostomy tube. The patient  now has a 35 [place] balloon retention gastrostomy tube. Tube is ready to be used. Electronically Signed   By: Quita Escobar  Anselm Pancoast M.D.   On: 03/09/2017 13:59   Ir Replc Gastro/colonic Tube Percut W/fluoro  Result Date: 02/25/2017 INDICATION: 71 year old female with a history of tracheoesophageal fistula, esophageal carcinoma, and recently placed percutaneous gastrostomy tube. Gastrostomy was placed 02/19/2017, and has been symptomatic, potentially withdrawn. The patient presents for evaluating the tube and possible replacement. EXAM: IMAGE GUIDED REPLACEMENT OF DISPLACED PERCUTANEOUS GASTROSTOMY MEDICATIONS: None ANESTHESIA/SEDATION: Versed 2.0 mg IV; Fentanyl 100 mcg IV Moderate Sedation Time:  20 minutes The patient was continuously monitored during the procedure by the interventional radiology nurse under my direct supervision. CONTRAST:  25 cc - administered into the gastric lumen. FLUOROSCOPY TIME:  Fluoroscopy Time:  minutes  seconds ( mGy). COMPLICATIONS: None PROCEDURE: Informed written consent was obtained from the patient after a thorough discussion of the procedural risks, benefits and alternatives. All questions were addressed. Maximal Sterile Barrier Technique was utilized including caps, mask, sterile gowns, sterile gloves, sterile drape, hand hygiene and skin antiseptic. A timeout was performed prior to the initiation of the procedure. Patient was positioned supine position on the fluoroscopy table. The indwelling percutaneous gastrostomy tube was injected in AP and lateral, confirming that the tube had been withdrawn from the stomach lumen, into the abdominal wall. The upper abdomen and the G-tube were then prepped and draped in the usual sterile fashion. Using moderate sedation, the tube was replaced. The balloon remained inflated, and a 5 [place] Kumpe the catheter with contrast was used to navigate through the existing tract into the gastric lumen. Once the catheter was confirmed within the gastric  lumen, a Rosen wire was placed. The catheter and the existing gastrostomy to were removed from the tract. Dilation of the soft tissue was performed with 16 [place] dilator. Catheter was advanced over the [facility] wire, and the wire was exchanged for Amplatz wire. 27 French peel-away was then placed over the Amplatz wire into the stomach lumen. With the coaxial wire in place, the peel-away dilator was removed, the Kumpe catheter was inserted through the lumen of the new 41 French balloon retention Intuit gastrostomy tube, and then the coaxial Kumpe the catheter and gastrostomy were inserted through the peel-away sheath into the stomach. Peel-away sheath was removed. Contrast injected through the med port of the gastrostomy tube confirmed location. The wire and coaxial catheter were removed after inflation of the balloon retention. Final image was stored. Patient tolerated the procedure well and remained hemodynamically stable throughout. No complications were encountered and no significant blood loss. IMPRESSION: Status post exchange/rescue of a displaced percutaneous gastrostomy, with placement of a new 51 French balloon retention tube. Signed, Dulcy Fanny. Earleen Newport, DO [facility] Electronically Signed   By: Corrie Escobar D.O.   On: 02/25/2017 16:32   Dg Abd 2 Views  Result Date: 02/27/2017 CLINICAL DATA:  Abdomen discomfort by newly placed G tube - Wednesday , 02/25/17; Hx diffuse large B-cell lymphoma EXAM: ABDOMEN - 2 VIEW COMPARISON:  02/25/2017 and previous FINDINGS: Visualized lung bases clear. No free air. Normal bowel gas pattern. Gastrostomy catheter projects in expected location. Scattered residual oral contrast in the colon. Gastropexy fasteners have advanced to the region of the cecum. No abnormal abdominal calcifications. Bilateral hip arthroplasty hardware incompletely visualized. IMPRESSION: Stable postop changes.  No acute disease.  Electronically Signed   By: Lucrezia Escobar M.D.   On: 02/27/2017 11:07   Dg Abd Portable 1v  Result Date: 03/08/2017 CLINICAL DATA:  71 year old female with G-tube placement. EXAM: PORTABLE ABDOMEN - 1  VIEW COMPARISON:  Abdominal radiograph dated 02/27/2017 FINDINGS: A percutaneous gastrostomy is noted with tip in the left upper abdomen. There is no bowel dilatation or evidence of obstruction. Multiple gallstones are noted. There is degenerative changes of the spine. No acute osseous pathology. IMPRESSION: 1. Percutaneous gastrostomy with tip in the left upper abdomen. 2. No evidence of bowel obstruction. 3. Cholelithiasis. Electronically Signed   By: Anner Crete M.D.   On: 03/08/2017 03:38   Dg Swallowing Func-speech Pathology  Result Date: 02/17/2017 CLINICAL DATA:  71 year old female diagnosed with Lymphoma including a mass at the left tracheoesophageal groove near the thyroid. Multiple imaging guided biopsies were performed, but ultimately a surgical biopsy of the mass was necessary on 12/26/2016 for diagnosis. The patient is now status post chemotherapy, with an unexplained cough, coughing with p.o. intake. EXAM: MODIFIED BARIUM SWALLOW TECHNIQUE: Different consistencies of barium were administered orally to the patient by the Speech Pathologist. Imaging of the pharynx was performed in the lateral projection. FLUOROSCOPY TIME:  Fluoroscopy Time:  0 minutes 54 seconds Radiation Exposure Index (if provided by the fluoroscopic device): 1.8mG y Number of Acquired Spot Images: 0 COMPARISON:  Neck CT 11/21/2016. FINDINGS: Nectar thick liquid- the patient is able to swallow liquid into the cervical esophagus, but with each swallow there is barium extension from the cervical esophagus (C5/C6 level) through the posterior wall of the trachea into the tracheal lumen. This occurs about 18 mm below the level of the glottis (series 5, image 4). IMPRESSION: Positive for acquired tracheoesophageal fistula at the  level of the cervical esophagus (C5-C6) about 18 mm below the glottis. PO barium liquid freely passed through the fistula and into the airway with each swallow. Study discussed by telephone with Dr. Hosie Poisson on 02/17/2017 at 1245 hours. Please refer to the Speech Pathologists report for complete details and recommendations. Electronically Signed   By: Genevie Ann M.D.   On: 02/17/2017 13:28

## 2017-03-11 NOTE — ED Notes (Signed)
Report given to [place], RN 4N @ [facility]

## 2017-03-12 ENCOUNTER — Observation Stay (HOSPITAL_COMMUNITY): Payer: Medicare Other

## 2017-03-12 DIAGNOSIS — R51 Headache: Secondary | ICD-10-CM | POA: Diagnosis not present

## 2017-03-12 DIAGNOSIS — Z89022 Acquired absence of left finger(s): Secondary | ICD-10-CM | POA: Diagnosis not present

## 2017-03-12 DIAGNOSIS — Z87891 Personal history of nicotine dependence: Secondary | ICD-10-CM | POA: Diagnosis not present

## 2017-03-12 DIAGNOSIS — Z79899 Other long term (current) drug therapy: Secondary | ICD-10-CM | POA: Diagnosis not present

## 2017-03-12 DIAGNOSIS — Z7901 Long term (current) use of anticoagulants: Secondary | ICD-10-CM | POA: Diagnosis not present

## 2017-03-12 DIAGNOSIS — K219 Gastro-esophageal reflux disease without esophagitis: Secondary | ICD-10-CM | POA: Diagnosis present

## 2017-03-12 DIAGNOSIS — Z89021 Acquired absence of right finger(s): Secondary | ICD-10-CM | POA: Diagnosis not present

## 2017-03-12 DIAGNOSIS — Z7952 Long term (current) use of systemic steroids: Secondary | ICD-10-CM | POA: Diagnosis not present

## 2017-03-12 DIAGNOSIS — J320 Chronic maxillary sinusitis: Secondary | ICD-10-CM | POA: Diagnosis present

## 2017-03-12 DIAGNOSIS — E039 Hypothyroidism, unspecified: Secondary | ICD-10-CM | POA: Diagnosis present

## 2017-03-12 DIAGNOSIS — I629 Nontraumatic intracranial hemorrhage, unspecified: Secondary | ICD-10-CM | POA: Diagnosis not present

## 2017-03-12 DIAGNOSIS — Y92480 Sidewalk as the place of occurrence of the external cause: Secondary | ICD-10-CM | POA: Diagnosis not present

## 2017-03-12 DIAGNOSIS — Z8249 Family history of ischemic heart disease and other diseases of the circulatory system: Secondary | ICD-10-CM | POA: Diagnosis not present

## 2017-03-12 DIAGNOSIS — S065X9A Traumatic subdural hemorrhage with loss of consciousness of unspecified duration, initial encounter: Secondary | ICD-10-CM | POA: Diagnosis not present

## 2017-03-12 DIAGNOSIS — W010XXA Fall on same level from slipping, tripping and stumbling without subsequent striking against object, initial encounter: Secondary | ICD-10-CM | POA: Diagnosis present

## 2017-03-12 DIAGNOSIS — Z79890 Hormone replacement therapy (postmenopausal): Secondary | ICD-10-CM | POA: Diagnosis not present

## 2017-03-12 DIAGNOSIS — Z86711 Personal history of pulmonary embolism: Secondary | ICD-10-CM | POA: Diagnosis not present

## 2017-03-12 DIAGNOSIS — Z96643 Presence of artificial hip joint, bilateral: Secondary | ICD-10-CM | POA: Diagnosis present

## 2017-03-12 DIAGNOSIS — Z6841 Body Mass Index (BMI) 40.0 and over, adult: Secondary | ICD-10-CM | POA: Diagnosis not present

## 2017-03-12 DIAGNOSIS — I2699 Other pulmonary embolism without acute cor pulmonale: Secondary | ICD-10-CM | POA: Diagnosis present

## 2017-03-12 DIAGNOSIS — I10 Essential (primary) hypertension: Secondary | ICD-10-CM | POA: Diagnosis present

## 2017-03-12 DIAGNOSIS — Z923 Personal history of irradiation: Secondary | ICD-10-CM | POA: Diagnosis not present

## 2017-03-12 DIAGNOSIS — R739 Hyperglycemia, unspecified: Secondary | ICD-10-CM | POA: Diagnosis present

## 2017-03-12 DIAGNOSIS — C8510 Unspecified B-cell lymphoma, unspecified site: Secondary | ICD-10-CM | POA: Diagnosis present

## 2017-03-12 DIAGNOSIS — J860 Pyothorax with fistula: Secondary | ICD-10-CM | POA: Diagnosis present

## 2017-03-12 DIAGNOSIS — S065X0A Traumatic subdural hemorrhage without loss of consciousness, initial encounter: Secondary | ICD-10-CM | POA: Diagnosis not present

## 2017-03-12 MED ORDER — FREE WATER
100.0000 mL | Status: DC | PRN
  Administered 2017-03-12: 100 mL
  Filled 2017-03-12: qty 100

## 2017-03-12 MED ORDER — ALLOPURINOL 100 MG PO TABS
300.0000 mg | ORAL_TABLET | Freq: Every day | ORAL | Status: DC
  Administered 2017-03-12 – 2017-03-13 (×2): 300 mg
  Filled 2017-03-12 (×2): qty 3

## 2017-03-12 MED ORDER — ONDANSETRON HCL 4 MG PO TABS: 4.0000 mg | ORAL_TABLET | Freq: Four times a day (QID) | ORAL | Status: DC | PRN

## 2017-03-12 MED ORDER — ONDANSETRON HCL 4 MG/2ML IJ SOLN: 4.0000 mg | Freq: Four times a day (QID) | INTRAMUSCULAR | Status: DC | PRN

## 2017-03-12 MED ORDER — LEVOTHYROXINE SODIUM 50 MCG PO TABS
50.0000 ug | ORAL_TABLET | Freq: Every day | ORAL | Status: DC
  Administered 2017-03-12 – 2017-03-13 (×2): 50 ug
  Filled 2017-03-12 (×2): qty 1

## 2017-03-12 MED ORDER — LORAZEPAM 0.5 MG PO TABS: 0.5000 mg | ORAL_TABLET | Freq: Four times a day (QID) | ORAL | Status: DC | PRN

## 2017-03-12 NOTE — Plan of Care (Signed)
Care plan updated.

## 2017-03-12 NOTE — Progress Notes (Signed)
Pt arrived to unit, report received from [employer]. VS  stable, patient is AOx4, no complaints. Telemetry box 3w09 on. Patient is calm, denies nausea or pain.. Suction and oxygen setup at bedside.

## 2017-03-12 NOTE — Consult Note (Signed)
Reason for Consult: Subdural hematoma Referring Physician: Emergency department  Kristen Escobar is an 71 y.o. female.  HPI: 71 year old female with a history of B-cell lymphoma who fell the steps outside of [place] and this morning sustaining a blunt head injury. Patient was evaluated taken to the emergency room underwent CT of her head which showed a parafalcine subdural patient is been admitted for observation. Currently patient complains of minimal headache no nausea no vomiting the numbness tingling arms or legs  Past Medical History:  Diagnosis Date  . Acute respiratory failure with hypoxia ([place]) 02/16/2017  . Arthritis   . Cancer ([place]) 03/2009   breast- rt  . GERD (gastroesophageal reflux disease)   . History of radiation therapy 07/12/10,completed   right breast 60 Gy x30 fx  . Hypertension   . Hypothyroidism   . Obesity   . Peripheral vascular disease ([place]) 1995   PT DEVELOPED CIRCULATION PROBLEMS IN BOTH HANDS AND GANGRENE OF BOTH INDEX FINGERS--REQUIRING AMPUTATION OF THE INDEX FINGERS AND VASCULAR SURGERY.  PT TOLD HER PROBLEMS RELATED TO SMOKING.   NO OTHER PROBLEMS SINCE.  Marland Kitchen Pneumonia   . Thyroid mass     Past Surgical History:  Procedure Laterality Date  . AMPUTATION     partial amputation of both index fingers  . BREAST SURGERY  2011   lumpectomy with node sampling- RIGHT  . COLONOSCOPY    . ESOPHAGOGASTRODUODENOSCOPY    . EXCISION MASS NECK Left 12/26/2016   Procedure: EXCISION MASS NECK;  Surgeon: Izora Gala, MD;  Location: [facility];  Service: ENT;  Laterality: Left;  open excision of thyroid mass left side with frozen section  . HAND SURGERY Bilateral 1995   Amputaed pointer fingers bilaterally  . IR FLUORO GUIDE PORT INSERTION RIGHT  01/14/2017  . IR GASTROSTOMY TUBE MOD SED  02/19/2017  . IR REPLC GASTRO/COLONIC TUBE PERCUT W/FLUORO  02/25/2017  . IR [place] TUBE PERCUT W/FLUORO  03/09/2017  . IR US GUIDE VASC ACCESS RIGHT  01/14/2017  . TOTAL HIP  ARTHROPLASTY  12/16/2011   right hip  . TOTAL HIP ARTHROPLASTY  01/20/2012   Procedure: TOTAL HIP ARTHROPLASTY ANTERIOR APPROACH;  Surgeon: Mauri Pole, MD;  Location: [facility];  Service: Orthopedics;  Laterality: Left;  . TUBAL LIGATION    . VASCULAR SURGERY     both hands    Family History  Problem Relation Age of Onset  . Cancer Mother        pt unaware of what kind  . Hearing loss Mother   . Coronary artery disease Father   . Diabetes Father   . Coronary artery disease Brother   . Coronary artery disease Brother   . Diabetes Sister   . Cancer Sister        breast  . Colon cancer Neg Hx   . Stomach cancer Neg Hx   . Rectal cancer Neg Hx   . Esophageal cancer Neg Hx     Social History:  reports that she quit smoking about 23 years ago. She has a 20.00 pack-year smoking history. she has never used smokeless tobacco. She reports that she drinks alcohol. She reports that she does not use drugs.  Allergies: No Known Allergies  Medications: I have reviewed the patient's current medications.  Results for orders placed or performed during the hospital encounter of 03/11/17 (from the past 48 hour(s))  CBC with Differential     Status: Abnormal   Collection Time: 03/11/17 12:27 PM  Result  Value Ref Range   WBC 6.9 4.0 - 10.5 K/uL   RBC 2.89 (L) 3.87 - 5.11 MIL/uL   Hemoglobin 8.4 (L) 12.0 - 15.0 g/dL   HCT 25.4 (L) 36.0 - 46.0 %   MCV 87.9 78.0 - 100.0 fL   MCH 29.1 26.0 - 34.0 pg   MCHC 33.1 30.0 - 36.0 g/dL   RDW 18.4 (H) 11.5 - 15.5 %   Platelets 198 150 - 400 K/uL   Neutrophils Relative % 79 %   Lymphocytes Relative 10 %   Monocytes Relative 10 %   Eosinophils Relative 0 %   Basophils Relative 1 %   Neutro Abs 5.4 1.7 - 7.7 K/uL   Lymphs Abs 0.7 0.7 - 4.0 K/uL   Monocytes Absolute 0.7 0.1 - 1.0 K/uL   Eosinophils Absolute 0.0 0.0 - 0.7 K/uL   Basophils Absolute 0.1 0.0 - 0.1 K/uL   WBC Morphology MILD LEFT SHIFT (1-5% METAS, OCC MYELO, OCC BANDS)   Basic metabolic  panel     Status: Abnormal   Collection Time: 03/11/17 12:27 PM  Result Value Ref Range   Sodium 138 135 - 145 mmol/L   Potassium 3.7 3.5 - 5.1 mmol/L   Chloride 107 101 - 111 mmol/L   CO2 24 22 - 32 mmol/L   Glucose, Bld 110 (H) 65 - 99 mg/dL   BUN 16 6 - 20 mg/dL   Creatinine, Ser 0.91 0.44 - 1.00 mg/dL   Calcium 8.1 (L) 8.9 - 10.3 mg/dL   GFR calc non Af Amer >60 >60 mL/min   GFR calc Af Amer >60 >60 mL/min    Comment: (NOTE) The eGFR has been calculated using the CKD EPI equation. This calculation has not been validated in all clinical situations. eGFR's persistently <60 mL/min signify possible Chronic Kidney Disease.    Anion gap 7 5 - 15  Protime-INR     Status: Abnormal   Collection Time: 03/11/17 12:27 PM  Result Value Ref Range   Prothrombin Time 26.4 (H) 11.4 - 15.2 seconds   INR 2.45   APTT     Status: None   Collection Time: 03/11/17 12:27 PM  Result Value Ref Range   aPTT 32 24 - 36 seconds  Heparin level - if patient on rivaroxaban (XARELTO) or apixaban [place]ne [place])     Status: Abnormal   Collection Time: 03/11/17 12:27 PM  Result Value Ref Range   Heparin Unfractionated >2.20 (H) 0.30 - 0.70 IU/mL    Comment: RESULTS CONFIRMED BY MANUAL DILUTION        IF HEPARIN RESULTS ARE BELOW EXPECTED VALUES, AND PATIENT DOSAGE HAS BEEN CONFIRMED, SUGGEST FOLLOW UP TESTING OF ANTITHROMBIN III LEVELS.   Type and screen [place]     Status: None   Collection Time: 03/11/17 12:37 PM  Result Value Ref Range   ABO/RH(D) A POS    Antibody Screen NEG    Sample Expiration 03/14/2017   MRSA PCR Screening     Status: None   Collection Time: 03/11/17  6:35 PM  Result Value Ref Range   MRSA by PCR NEGATIVE NEGATIVE    Comment:        The GeneXpert MRSA Assay (FDA approved for NASAL specimens only), is one component of a comprehensive MRSA colonization surveillance program. It is not intended to diagnose MRSA infection nor to guide or monitor  treatment for MRSA infections.   Hemoglobin A1c     Status: Abnormal   Collection Time:  03/11/17 10:00 PM  Result Value Ref Range   Hgb A1c MFr Bld 6.8 (H) 4.8 - 5.6 %    Comment: (NOTE) Pre diabetes:          5.7%-6.4% Diabetes:              >6.4% Glycemic control for   <7.0% adults with diabetes    Mean Plasma Glucose 148.46 mg/dL    Ct Head Wo Contrast  Result Date: 03/12/2017 CLINICAL DATA:  Head injury.  Follow-up intracranial hemorrhage EXAM: CT HEAD WITHOUT CONTRAST TECHNIQUE: Contiguous axial images were obtained from the base of the skull through the vertex without intravenous contrast. COMPARISON:  03/11/2017 FINDINGS: Brain: Interhemispheric subdural hematoma shows mild progression in size. Hematoma measures 9 mm in thickness and has increased in AP dimension. No other areas of hemorrhage. Negative for midline shift Atrophy with chronic microvascular ischemia in the white matter. No acute infarct or mass. Vascular: Negative for hyperdense vessel Skull: Negative for skull fracture. Sinuses/Orbits: Air-fluid level left maxillary sinus unchanged. If the patient has acute symptoms in the left base, recommend CT face to rule out facial fracture. Other: Posterior scalp hematoma unchanged. IMPRESSION: 9 mm interhemispheric subdural hematoma shows mild progression in the interval. No shift of the midline structures. Air-fluid level left maxillary sinus. If the patient has acute injury in this area, CT face recommended to rule out facial fracture. These results will be called to the ordering clinician or representative by the Radiologist Assistant, and communication documented in the PACS or zVision Dashboard. Electronically Signed   By: Franchot Gallo M.D.   On: 03/12/2017 06:53   Ct Head Wo Contrast  Result Date: 03/11/2017 CLINICAL DATA:  Fall, hit back of head EXAM: CT HEAD WITHOUT CONTRAST TECHNIQUE: Contiguous axial images were obtained from the base of the skull through the vertex  without intravenous contrast. COMPARISON:  None. FINDINGS: Brain: There is extra-axial blood noted along the left side of the falx, measuring 9 mm in thickness, likely subdural blood. No significant mass effect. No midline shift. No hydrocephalus or acute infarction. Vascular: No hyperdense vessel or unexpected calcification. Skull: No acute calvarial abnormality. Sinuses/Orbits: Air-fluid levels noted in the left maxillary and left sphenoid sinuses. Mastoid air cells are clear. Orbital soft tissues unremarkable. Other: None IMPRESSION: Extra-axial blood along the left side of the falx measuring 9 mm compatible with small parafalcine subdural hematoma. No significant mass effect or midline shift. Air-fluid levels in the left maxillary and sphenoid sinuses may reflect acute sinusitis. Critical Value/emergent results were called by telephone at the time of interpretation on 03/11/2017 at 11:17 am to Dr. Virgel Manifold , who verbally acknowledged these results. Electronically Signed   By: Rolm Baptise M.D.   On: 03/11/2017 11:19    Review of Systems  Gastrointestinal: Positive for nausea.  Neurological: Positive for headaches.   Blood pressure (!) 138/55, pulse 77, temperature 98.2 F (36.8 C), resp. rate (!) 21, height '5\' 2"'$  (1.575 m), weight 101.6 kg (224 lb), SpO2 93 %. Physical Exam  Assessment/Plan: 42-year-old female with parafalcine subdural minimal mass effect and CT scan show slight enlargement. Recommend continued observation hospital for another 24 hours with repeat CT tomorrow if CT stable patient can be discharged home. Would recommend holding all anticoagulation for at least 72 hours after stable CT scan.  Canary Fister P 03/12/2017, 9:09 AM

## 2017-03-12 NOTE — Progress Notes (Signed)
[facility] TEAM 1 - Stepdown/ICU TEAM  Kristen Escobar  POE:[id number] DOB: 1945-12-23 DOA: 03/11/2017 PCP: Marletta Lor, MD    Brief Narrative:  71 y.o. 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 Despite recent diagnosis, patient has had a complicated course including replacement of her PEG tube twice and a pulmonary embolus for which she has been on xarelto. Patient just had a feeding tube replaced on Monday, 11/19. She was going to see her Oncologist due to feeding tube intolerance when she tripped on the curb and fell hitting the back of her head. She did not lose consciousness, was not confused or dizzy. Staff from the office and family were able to get her back up to an upright position and it was advised that she go to the emergency room for further evaluation  In the emergency room, patient was found to have a small parafalcine subdural hematoma.  No mass shift or midline shift noted. Patient was given Wandalee Ferdinand to reverse her anticoagulation. Neurosurgery recommended no surgical intervention w/ monitoring overnight and repeat head CT.  Significant Events: 11/21 admit after fall w/ small SDH  Subjective: Resting comfortably in bed.  No HA whatsoever.  Denies any focal neuro deficits.  Feels "fine." Is anxious to be able to go home.    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  Pulmonary embolus on xarelto Holding Xarelto for now - no know LE DVT  therefore filter not helpful   Hypothyroidism continue Synthroid  Left maxillary sinusitis incidentally noted on head CT - to be tx w/ 10 days of Augmentin   Hyperglycemia No history of diabetes - is on intermittent steroid w/ chemo - A1c c/w DM at 6.8  DVT prophylaxis: SCDs Code Status: FULL CODE Family Communication: spoke w/ husband at bedside at length  Disposition Plan: d/c home when CT head stable   Consultants:  Neurosurgery   Antimicrobials:  none  Objective: Blood pressure (!) 128/58, pulse 78, temperature 98.4 F (36.9 C), temperature source Oral, resp. rate 20, height 5\' 2"  (1.575 m), weight 101.6 kg (224 lb), SpO2 96 %.  Intake/Output Summary (Last 24 hours) at 03/12/2017 1642 Last data filed at 03/12/2017 1510 Gross per 24 hour  Intake 956.66 ml  Output 200 ml  Net 756.66 ml   Filed Weights   03/11/17 1133  Weight: 101.6 kg (224 lb)    Examination: General: No acute respiratory distress Lungs: Clear to auscultation bilaterally without wheezes or crackles Cardiovascular: Regular rate and rhythm without murmur gallop or rub normal S1 and S2 Abdomen: Nontender, nondistended, soft, bowel sounds positive, no rebound, no ascites, no appreciable mass Extremities: No significant cyanosis, clubbing, or edema bilateral lower extremities  CBC: Recent Labs  Lab 03/06/17 0411 03/07/17 0500 03/08/17 0451 03/08/17 2011 03/09/17 0428 03/11/17 1227  WBC 0.1* 0.2* 0.5*  --  2.3* 6.9  NEUTROABS  --   --  0.2*  --  1.7 5.4  HGB 7.1* 7.3* 6.5* 8.7* 8.4* 8.4*  HCT 21.0* 22.0* 20.1* 26.0* 25.3* 25.4*  MCV 86.8 86.6 85.5  --  87.2 87.9  PLT 32* 27* 26*  --  58* 858   Basic Metabolic Panel: Recent Labs  Lab 03/06/17 0411 03/07/17 0500 03/08/17 0451 03/09/17 0428 03/11/17 1227  NA 136  --  140 140 138  K 3.8  --  3.1* 4.1 3.7  CL 110  --  112* 113* 107  CO2 22  --  22 23 24   GLUCOSE 212*  --  121* 173* 110*  BUN 13  --  15 16 16   CREATININE 0.97 0.90  0.72 0.72 0.91  CALCIUM 7.4*  --  7.5* 7.9* 8.1*  MG  --   --  1.9 2.4  --    GFR: Estimated Creatinine Clearance: 64.2 mL/min (by C-G formula based on SCr of 0.91 mg/dL).  Liver Function Tests: Recent Labs  Lab 03/08/17 0451 03/09/17 0428  AST 20 23  ALT 78* 67*  ALKPHOS 31* 34*  BILITOT 0.4 0.5  PROT 5.0* 5.5*  ALBUMIN 1.8* 2.0*    Coagulation Profile: Recent Labs  Lab 03/11/17 1227  INR 2.45    HbA1C: Hgb A1c MFr Bld  Date/Time Value Ref Range Status  03/11/2017 10:00 PM 6.8 (H) 4.8 - 5.6 % Final    Comment:    (NOTE) Pre diabetes:          5.7%-6.4% Diabetes:              >6.4% Glycemic control for   <7.0% adults with diabetes     CBG: Recent Labs  Lab 03/08/17 2007 03/09/17 0007 03/09/17 0401 03/09/17 0737 03/09/17 1214  GLUCAP 152* 134* 158* 160* 120*    Recent Results (from the past 240 hour(s))  Culture, Blood     Status: None   Collection Time: 03/05/17 11:01 AM  Result Value Ref Range Status   BLOOD CULTURE, ROUTINE Final report  Final   Organism ID, Bacteria Comment  Final    Comment: No aerobic or anaerobic growth in five days.  TECHNOLOGIST REVIEW     Status: None   Collection Time: 03/05/17 11:02 AM  Result Value Ref Range Status   Technologist Review Oc lymph, rare seg  Final  Culture, Blood     Status: None   Collection Time: 03/05/17 11:12 AM  Result Value Ref Range Status   BLOOD CULTURE, ROUTINE Final report  Final   Organism ID, Bacteria Comment  Final    Comment: No aerobic or anaerobic growth in five days.  Culture, blood (Routine X 2) w Reflex to ID Panel     Status: None   Collection Time: 03/06/17  4:11 AM  Result Value Ref Range Status   Specimen Description BLOOD LEFT ANTECUBITAL  Final   Special Requests   Final    BOTTLES DRAWN AEROBIC ONLY Blood Culture adequate volume   Culture   Final    NO GROWTH 5 DAYS Performed at [facility], [address] [place], [place] [address]    Report Status 03/11/2017  FINAL  Final  Culture, blood (Routine X 2) w Reflex to ID Panel     Status: None   Collection Time: 03/06/17  4:11 AM  Result Value Ref Range Status   Specimen Description BLOOD RIGHT ARM  Final   Special Requests   Final    BOTTLES DRAWN AEROBIC ONLY Blood Culture adequate volume   Culture   Final    NO GROWTH 5  DAYS Performed at [facility], [place] [address], [place], [place] [address]    Report Status 03/11/2017 FINAL  Final  Culture, Urine     Status: None   Collection Time: 03/06/17  9:20 PM  Result Value Ref Range Status   Specimen Description URINE, RANDOM  Final   Special Requests NONE  Final   Culture   Final    NO GROWTH Performed at [facility], [address], [place],  [address]    Report Status 03/08/2017 FINAL  Final  C difficile quick scan w PCR reflex     Status: None   Collection Time: 03/07/17  1:44 PM  Result Value Ref Range Status   C Diff antigen NEGATIVE NEGATIVE Final   C Diff toxin NEGATIVE NEGATIVE Final   C Diff interpretation No C. difficile detected.  Final  Gastrointestinal Panel by PCR , Stool     Status: None   Collection Time: 03/07/17  1:44 PM  Result Value Ref Range Status   Campylobacter species NOT DETECTED NOT DETECTED Final   Plesimonas shigelloides NOT DETECTED NOT DETECTED Final   Salmonella species NOT DETECTED NOT DETECTED Final   Yersinia enterocolitica NOT DETECTED NOT DETECTED Final   Vibrio species NOT DETECTED NOT DETECTED Final   Vibrio cholerae NOT DETECTED NOT DETECTED Final   Enteroaggregative E coli (EAEC) NOT DETECTED NOT DETECTED Final   Enteropathogenic E coli (EPEC) NOT DETECTED NOT DETECTED Final   Enterotoxigenic E coli (ETEC) NOT DETECTED NOT DETECTED Final   Shiga like toxin producing E coli (STEC) NOT DETECTED NOT DETECTED Final   Shigella/Enteroinvasive E coli (EIEC) NOT DETECTED NOT DETECTED Final   Cryptosporidium NOT DETECTED NOT DETECTED Final   Cyclospora cayetanensis NOT DETECTED NOT  DETECTED Final   Entamoeba histolytica NOT DETECTED NOT DETECTED Final   Giardia lamblia NOT DETECTED NOT DETECTED Final   Adenovirus F40/41 NOT DETECTED NOT DETECTED Final   Astrovirus NOT DETECTED NOT DETECTED Final   Norovirus GI/GII NOT DETECTED NOT DETECTED Final   Rotavirus A NOT DETECTED NOT DETECTED Final   Sapovirus (I, II, IV, and V) NOT DETECTED NOT DETECTED Final  MRSA PCR Screening     Status: None   Collection Time: 03/11/17  6:35 PM  Result Value Ref Range Status   MRSA by PCR NEGATIVE NEGATIVE Final    Comment:        The GeneXpert MRSA Assay (FDA approved for NASAL specimens only), is one component of a comprehensive MRSA colonization surveillance program. It is not intended to diagnose MRSA infection nor to guide or monitor treatment for MRSA infections.      Scheduled Meds: Continuous Infusions: . sodium chloride 50 mL/hr at 03/12/17 1400     LOS: 0 days   Cherene Altes, MD [facility] Office  [phone] Pager - Text Page per Amion as per below:  On-Call/Text Page:      [email or web address]      password [email or web address]  If 7PM-7AM, please contact night-coverage [email or web address] Password [email or web address] 03/12/2017, 4:42 PM

## 2017-03-13 ENCOUNTER — Inpatient Hospital Stay (HOSPITAL_COMMUNITY): Payer: Medicare Other

## 2017-03-13 ENCOUNTER — Other Ambulatory Visit: Payer: Medicare Other

## 2017-03-13 ENCOUNTER — Ambulatory Visit: Payer: Medicare Other

## 2017-03-13 ENCOUNTER — Telehealth: Payer: Self-pay | Admitting: Medical

## 2017-03-13 DIAGNOSIS — L8990 Pressure ulcer of unspecified site, unspecified stage: Secondary | ICD-10-CM | POA: Diagnosis present

## 2017-03-13 LAB — COMPREHENSIVE METABOLIC PANEL
ALT: 54 U/L (ref 14–54)
AST: 34 U/L (ref 15–41)
Albumin: 1.9 g/dL — ABNORMAL LOW (ref 3.5–5.0)
Alkaline Phosphatase: 41 U/L (ref 38–126)
Anion gap: 7 (ref 5–15)
BUN: 13 mg/dL (ref 6–20)
CO2: 21 mmol/L — ABNORMAL LOW (ref 22–32)
Calcium: 8 mg/dL — ABNORMAL LOW (ref 8.9–10.3)
Chloride: 111 mmol/L (ref 101–111)
Creatinine, Ser: 0.81 mg/dL (ref 0.44–1.00)
GFR calc Af Amer: >60 mL/min (ref >60)
GFR calc non Af Amer: >60 mL/min (ref >60)
Glucose, Bld: 55 mg/dL — ABNORMAL LOW (ref 65–99)
Potassium: 3.8 mmol/L (ref 3.5–5.1)
Sodium: 139 mmol/L (ref 135–145)
Total Bilirubin: 0.7 mg/dL (ref 0.3–1.2)
Total Protein: 4.9 g/dL — ABNORMAL LOW (ref 6.5–8.1)

## 2017-03-13 LAB — CBC
HCT: 24.9 % — ABNORMAL LOW (ref 36.0–46.0)
Hemoglobin: 8 g/dL — ABNORMAL LOW (ref 12.0–15.0)
MCH: 28.7 pg (ref 26.0–34.0)
MCHC: 32.1 g/dL (ref 30.0–36.0)
MCV: 89.2 fL (ref 78.0–100.0)
Platelets: 337 10*3/uL (ref 150–400)
RBC: 2.79 MIL/uL — ABNORMAL LOW (ref 3.87–5.11)
RDW: 19.2 % — ABNORMAL HIGH (ref 11.5–15.5)
WBC: 8.9 10*3/uL (ref 4.0–10.5)

## 2017-03-13 MED ORDER — ALLOPURINOL 300 MG PO TABS: 300.0000 mg | ORAL_TABLET | Freq: Every day | ORAL | Status: DC

## 2017-03-13 MED ORDER — RIVAROXABAN 20 MG PO TABS: 20.0000 mg | ORAL_TABLET | Freq: Every day | ORAL | 0 refills | Status: DC

## 2017-03-13 MED ORDER — PROCHLORPERAZINE MALEATE 10 MG PO TABS: 10.0000 mg | ORAL_TABLET | Freq: Four times a day (QID) | ORAL | Status: DC | PRN

## 2017-03-13 MED ORDER — ONDANSETRON HCL 8 MG PO TABS: 8.0000 mg | ORAL_TABLET | Freq: Three times a day (TID) | ORAL | Status: DC | PRN

## 2017-03-13 MED ORDER — LORAZEPAM 0.5 MG PO TABS: 0.5000 mg | ORAL_TABLET | Freq: Four times a day (QID) | ORAL | Status: DC | PRN

## 2017-03-13 MED ORDER — LEVOTHYROXINE SODIUM 50 MCG PO TABS: 50.0000 ug | ORAL_TABLET | Freq: Every day | ORAL | Status: DC

## 2017-03-13 MED ORDER — HEPARIN SOD (PORK) LOCK FLUSH 100 UNIT/ML IV SOLN
500.0000 [IU] | INTRAVENOUS | Status: AC | PRN
  Administered 2017-03-13: 500 [IU]

## 2017-03-13 NOTE — Progress Notes (Addendum)
Initial Nutrition Assessment  DOCUMENTATION CODES:   Morbid obesity  INTERVENTION:   Upon discharge: Recommend continuation of home tube feeding regimen of Osmolite 1.5 formula via PEG with goal of 237 ml (1 carton) 6 times a day to provide 2133 kcal, 89 grams of protein, 1080 ml of water.  Free water flushes of 60 ml at each bolus.  Additionally provide 200 ml free water flushes TID between feedings. Total free water: 2040 ml/day.   Recommend restart of Reglan medication to aid in gastric motility.    NUTRITION DIAGNOSIS:   Inadequate oral intake related to inability to eat as evidenced by NPO status.  GOAL:   Patient will meet greater than or equal to 90% of their needs  MONITOR:   Weight trends, Labs, Skin, I & O's, TF tolerance  REASON FOR ASSESSMENT:   Consult, Malnutrition Screening Tool (TF recommendations)  ASSESSMENT:   71 y.o. 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 Presents after fall, patient was found to have a small parafalcine subdural hematoma.  PEG tube has been replaced twice with last replacement on Monday 11/19. Plans for discharge home today. No feedings were initiated since admission 11/21 due to MD order to hold TF to monitor neuro status. Family at bedside reports restarting tube feeds today at home. Family member reports he has been slowly getting pt up to goal TF with most amount of formula given a day is 2.5 cartons. Family reports it has been difficult to reach goal of 237 ml given 6 times daily due to frequently admission to the hospital over the past couple of weeks. Family member reports concern for TF tolerance as pt with spontaneous vomiting on occassion, unsure if related to formula or chemotherapy side affects. Pt reports not wanting to be on  continuous feeds as she does not want to be connected to the pump all day. Additional plans to restart reglan medication to aid in motility. Plans to slowly increase bolus amounts at a more frequent schedule to quickly reach goal regimen. Discussed if pt unable to tolerate feedings at goal to recommend switching formula to promote tolerance. Pt and family reports understanding. They report plans to follow up with [facility] dietitian next week.   NUTRITION - FOCUSED PHYSICAL EXAM:    Most Recent Value  Orbital Region  No depletion  Upper Arm Region  No depletion  Thoracic and Lumbar Region  No depletion  Buccal Region  No depletion  Temple Region  No depletion  Clavicle Bone Region  No depletion  Clavicle and Acromion Bone Region  No depletion  Scapular Bone Region  No depletion  Dorsal Hand  No depletion  Patellar Region  No depletion  Anterior Thigh Region  No depletion  Posterior Calf Region  No depletion  Edema (RD Assessment)  Mild  Hair  Reviewed  Eyes  Reviewed  Mouth  Reviewed  Skin  Reviewed  Nails  Reviewed       Diet Order:  Diet NPO time specified  EDUCATION NEEDS:   Education needs have been addressed  Skin:  Skin Assessment: Skin Integrity Issues: Skin Integrity Issues:: Stage II Stage II: to anus  Last BM:  11/22  Height:   Ht Readings from Last 1 Encounters:  03/11/17 5\' 2"  (1.575 m)    Weight:   Wt Readings from Last  1 Encounters:  03/11/17 224 lb (101.6 kg)    Ideal Body Weight:  50 kg  BMI:  Body mass index is 40.97 kg/m.  Estimated Nutritional Needs:   Kcal:  2100-2300  Protein:  95-115 grams  Fluid:  >/= 2 L/day    Corrin Parker, MS, RD, LDN Pager # [phone] After hours/ weekend pager # [phone]

## 2017-03-13 NOTE — Progress Notes (Signed)
Patient ID: Kristen Escobar, female   DOB: 09/07/1945, 71 y.o.   MRN: [id number] CT stable without change. Okay for patient be discharged with scheduled follow-up with Dr. Arnoldo Morale. If patient must be maintained on anticoagulation it is okay to restart this in 72 hours.

## 2017-03-13 NOTE — Plan of Care (Signed)
Progressing with goals

## 2017-03-13 NOTE — Discharge Summary (Signed)
DISCHARGE SUMMARY  Kristen Escobar  MR#: [id number]  DOB:January 16, 1946  Date of Admission: 03/11/2017 Date of Discharge: 03/13/2017  Attending Physician:Anwar Crill T  Patient's ZDG:[id number], Doretha Sou, MD  Consults:  Neurosurgery   Disposition: D/C home   Follow-up Appts: Follow-up Information    [facility].   Specialty:  Home Health Services Why:  RN PT Contact information: [address] [place] [address] [phone]        Marletta Lor, MD. Schedule an appointment as soon as possible for a visit in 5 day(s).   Specialty:  Internal Medicine Contact information: [place] [address] [phone]        Newman Pies, MD. Schedule an appointment as soon as possible for a visit in 10 day(s).   Specialty:  Neurosurgery Contact information: 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 Pulmonary embolus on xarelto Hypothyroidism Left maxillary sinusitis Hyperglycemia  Initial presentation: 71 y.o.femalewith history ofhigh-grade diffuse large B-cell lymphoma first found in her thyroid diagnosed in September 3762 complicated by tracheoesophageal fistula(caused by her tumor)leading her to require a PEG tube. Despite recent diagnosis, patient has had a complicated course including replacement of her PEG tube twice and a pulmonary embolus for which she has been on xarelto. Patient just had a feeding tube replaced on Monday, 11/19. She was going to see her Oncologist due to feeding tube intolerance when she tripped on the  curb and fell hitting the back of her head. She did not lose consciousness, was not confused or dizzy. Stafffrom the office and family were able to get her back up to an upright position and it was advised that she go to the emergency room for further evaluation  In the emergency room, patient was found to have a small parafalcine subdural hematoma.No mass shift or midline shift noted. Patient was given Wandalee Ferdinand to reverse her anticoagulation. Neurosurgery recommended no surgical intervention w/ monitoring overnight and repeat head CT.  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  Pulmonary embolus on xarelto Anticoagulation was held throughout the hospital stay - no know LE DVT therefore filter was not felt to be required -no complications clinically - to resume anticoagulation 03/16/17  Hypothyroidism continue Synthroid  Left maxillary sinusitis incidentally noted on head CT but patient entirely asymptomatic - given her history of intolerable severe diarrhea I wish to avoid exposure to antibiotics  at this time -follow clinically  Hyperglycemia No history of diabetes - is on intermittent steroid w/ chemo - A1c c/w DM at 6.8 but  CBGs controlled throughout this hospital stay - before committing this patient to a diagnosis of diabetes I feel that outpatient follow-up at such time that chemotherapy can be discontinued and prednisone stopped would be most appropriate    Allergies as of 03/13/2017   No Known Allergies     Medication List    STOP taking these medications   saccharomyces boulardii 250 MG capsule Commonly known as:  FLORASTOR   sucralfate 1 g tablet Commonly known as:  CARAFATE     TAKE these medications   allopurinol 300 MG tablet Commonly known as:  ZYLOPRIM Place 1 tablet (300 mg total) into feeding tube daily.   levothyroxine 50 MCG tablet Commonly known as:  SYNTHROID, LEVOTHROID Place 1 tablet (50 mcg total) into feeding tube daily.   loperamide 2 MG tablet Commonly known as:  IMODIUM A-D Place 2 mg into feeding tube 4 (four) times daily as needed for diarrhea or loose stools.   LORazepam 0.5 MG tablet Commonly known as:  ATIVAN Place 1 tablet (0.5 mg total) into feeding tube every 6 (six) hours as needed (Nausea or vomiting).   omeprazole 20 MG capsule Commonly known as:  PRILOSEC Take 20 mg daily by mouth.   ondansetron 8 MG tablet Commonly known as:  ZOFRAN Place 1 tablet (8 mg total) into feeding tube every 8 (eight) hours as needed for refractory nausea / vomiting. Start day 3 after chemo. What changed:  how to take this   predniSONE 20 MG tablet Commonly known as:  DELTASONE Take 3 tablets (60 mg total) by mouth daily. Take on days 1-5 of chemotherapy.   prochlorperazine 10 MG tablet Commonly known as:  COMPAZINE Place 1 tablet (10 mg total) into feeding tube every 6 (six) hours as needed (Nausea or vomiting).   rivaroxaban 20 MG Tabs tablet Commonly known as:  XARELTO Take 1 tablet (20 mg total) by mouth daily with supper. Start taking on:  03/16/2017 What changed:    These instructions start on 03/16/2017. If you are unsure what to do until then, ask your  doctor or other care provider.  Another medication with the same name was removed. Continue taking this medication, and follow the directions you see here.       Day of Discharge BP (!) 135/46 (BP Location: Left Arm)   Pulse 76   Temp 98.4 F (36.9 C) (Oral)   Resp 18   Ht 5\' 2"  (1.575 m)   Wt 101.6 kg (224 lb)   SpO2 96%   BMI 40.97 kg/m   Physical Exam: General: No acute respiratory distress Lungs: Clear to auscultation bilaterally without wheezes or crackles Cardiovascular: Regular rate and rhythm without murmur gallop or rub normal S1 and S2 Abdomen: Nontender, obese, soft, bowel sounds positive, no rebound, no ascites, no appreciable mass Extremities: No significant cyanosis, clubbing, or edema bilateral lower extremities  Basic Metabolic Panel: Recent Labs  Lab 03/07/17 0500 03/08/17 0451 03/09/17 0428 03/11/17 1227 03/13/17 0926  NA  --  140 140 138 139  K  --  3.1* 4.1 3.7 3.8  CL  --  112* 113* 107 111  CO2  --  22 23 24  21*  GLUCOSE  --  121* 173* 110* 55*  BUN  --  15 16 16 13   CREATININE 0.90 0.72 0.72 0.91 0.81  CALCIUM  --  7.5* 7.9* 8.1* 8.0*  MG  --  1.9 2.4  --   --     Liver Function Tests: Recent Labs  Lab 03/08/17 0451 03/09/17 0428 03/13/17 0926  AST 20 23 34  ALT 78* 67* 54  ALKPHOS 31* 34* 41  BILITOT 0.4 0.5 0.7  PROT 5.0* 5.5* 4.9*  ALBUMIN 1.8* 2.0* 1.9*    Coags: Recent Labs  Lab 03/11/17 1227  INR 2.45    CBC: Recent Labs  Lab 03/07/17 0500 03/08/17 0451 03/08/17 2011 03/09/17 0428 03/11/17 1227 03/13/17 0926  WBC 0.2* 0.5*  --  2.3* 6.9 8.9  NEUTROABS  --  0.2*  --  1.7 5.4  --   HGB 7.3* 6.5* 8.7* 8.4* 8.4* 8.0*  HCT 22.0* 20.1* 26.0* 25.3* 25.4* 24.9*  MCV 86.6 85.5  --  87.2 87.9 89.2  PLT 27* 26*  --  58* 198 337    CBG: Recent Labs  Lab 03/08/17 2007 03/09/17 0007 03/09/17 0401 03/09/17 0737 03/09/17 1214  GLUCAP 152* 134* 158* 160* 120*    Recent Results (from the past 240 hour(s))    Culture, Blood     Status: None   Collection Time: 03/05/17 11:01 AM  Result Value Ref Range Status   BLOOD CULTURE, ROUTINE Final report  Final   Organism ID, Bacteria Comment  Final    Comment: No aerobic or anaerobic growth in five days.  TECHNOLOGIST REVIEW     Status: None   Collection Time: 03/05/17 11:02 AM  Result Value Ref Range Status   Technologist Review Oc lymph, rare seg  Final  Culture, Blood     Status: None   Collection Time: 03/05/17 11:12 AM  Result Value Ref Range Status   BLOOD CULTURE, ROUTINE Final report  Final   Organism ID, Bacteria Comment  Final    Comment: No aerobic or anaerobic growth in five days.  Culture, blood (Routine X 2) w Reflex to ID Panel     Status: None   Collection Time: 03/06/17  4:11 AM  Result Value Ref Range Status   Specimen Description BLOOD LEFT ANTECUBITAL  Final   Special Requests   Final    BOTTLES DRAWN AEROBIC ONLY Blood Culture adequate volume   Culture   Final    NO GROWTH 5 DAYS Performed at [facility], [address] [place], [place] [address]    Report Status 03/11/2017 FINAL  Final  Culture, blood (Routine X 2) w Reflex to ID Panel     Status: None   Collection Time: 03/06/17  4:11 AM  Result Value Ref Range Status   Specimen Description BLOOD RIGHT ARM  Final   Special Requests   Final    BOTTLES DRAWN AEROBIC ONLY Blood Culture adequate volume   Culture   Final    NO GROWTH 5 DAYS Performed at [facility], [place] [address] [place], [place] [address]    Report Status 03/11/2017 FINAL  Final  Culture, Urine     Status: None   Collection Time: 03/06/17  9:20 PM  Result Value Ref Range Status   Specimen Description URINE, RANDOM  Final   Special Requests NONE  Final   Culture   Final    NO GROWTH Performed at [facility], [place] [address], [place], [place] [address]    Report Status 03/08/2017 FINAL  Final  C difficile quick scan w PCR reflex     Status: None  Collection Time: 03/07/17   1:44 PM  Result Value Ref Range Status   C Diff antigen NEGATIVE NEGATIVE Final   C Diff toxin NEGATIVE NEGATIVE Final   C Diff interpretation No C. difficile detected.  Final  Gastrointestinal Panel by PCR , Stool     Status: None   Collection Time: 03/07/17  1:44 PM  Result Value Ref Range Status   Campylobacter species NOT DETECTED NOT DETECTED Final   Plesimonas shigelloides NOT DETECTED NOT DETECTED Final   Salmonella species NOT DETECTED NOT DETECTED Final   Yersinia enterocolitica NOT DETECTED NOT DETECTED Final   Vibrio species NOT DETECTED NOT DETECTED Final   Vibrio cholerae NOT DETECTED NOT DETECTED Final   Enteroaggregative E coli (EAEC) NOT DETECTED NOT DETECTED Final   Enteropathogenic E coli (EPEC) NOT DETECTED NOT DETECTED Final   Enterotoxigenic E coli (ETEC) NOT DETECTED NOT DETECTED Final   Shiga like toxin producing E coli (STEC) NOT DETECTED NOT DETECTED Final   Shigella/Enteroinvasive E coli (EIEC) NOT DETECTED NOT DETECTED Final   Cryptosporidium NOT DETECTED NOT DETECTED Final   Cyclospora cayetanensis NOT DETECTED NOT DETECTED Final   Entamoeba histolytica NOT DETECTED NOT DETECTED Final   Giardia lamblia NOT DETECTED NOT DETECTED Final   Adenovirus F40/41 NOT DETECTED NOT DETECTED Final   Astrovirus NOT DETECTED NOT DETECTED Final   Norovirus GI/GII NOT DETECTED NOT DETECTED Final   Rotavirus A NOT DETECTED NOT DETECTED Final   Sapovirus (I, II, IV, and V) NOT DETECTED NOT DETECTED Final  MRSA PCR Screening     Status: None   Collection Time: 03/11/17  6:35 PM  Result Value Ref Range Status   MRSA by PCR NEGATIVE NEGATIVE Final    Comment:        The GeneXpert MRSA Assay (FDA approved for NASAL specimens only), is one component of a comprehensive MRSA colonization surveillance program. It is not intended to diagnose MRSA infection nor to guide or monitor treatment for MRSA infections.     Time spent in discharge (includes decision making &  examination of pt): 30 minutes  03/13/2017, 11:11 AM   Cherene Altes, MD [facility] Office  [phone] Pager [phone]  On-Call/Text Page:      [email or web address]      password [facility]

## 2017-03-13 NOTE — Telephone Encounter (Signed)
Per 11/15 - no los at check out

## 2017-03-13 NOTE — Care Management Note (Signed)
Case Management Note  Patient Details  Name: Kristen Escobar MRN: [id number] Date of Birth: 30-Nov-1945  Subjective/Objective:                    Action/Plan: Pt was active with [facility] prior to admission. She asked to continue with their services. Resumption orders entered. Brad with [facility] notified and accepted.  Husband to provide transportation home.   Expected Discharge Date:  03/13/17               Expected Discharge Plan:  [place]  In-House Referral:     Discharge planning Services  CM Consult  Post Acute Care Choice:  Home Health Choice offered to:  Patient, Spouse  DME Arranged:    DME Agency:     HH Arranged:  RN [place] Agency:  [facility]  Status of Service:  Completed, signed off  If discussed at [place] of Stay Meetings, dates discussed:    Additional Comments:  Pollie Friar, RN 03/13/2017, 11:41 AM

## 2017-03-13 NOTE — Discharge Instructions (Signed)
Subdural Hematoma °A subdural hematoma is a collection of blood between the brain and its tough outer covering (dura). As the amount of blood increases, it puts pressure on the brain. °There are two types of subdural hematomas: °· Acute. This type develops shortly after a hard, direct hit (blow) to the head and causes blood to collect very quickly. This is a medical emergency. If it is not diagnosed and treated quickly, it can lead to severe brain injury or death. °· Chronic. This is when bleeding develops more slowly, over weeks or months. ° °What are the causes? °This condition is caused by bleeding (hemorrhage) from a broken (ruptured) blood vessel. In most cases, a blood vessel ruptures and bleeds because of injury (trauma) to the head, such as from a hard, direct hit. Head trauma can happen in: °· Traffic accidents. °· Falls. °· Assaults. °· Sport injuries. ° °In rare cases, hemorrhage can happen without a known cause (spontaneously), especially if you take blood thinners (anticoagulants). °What increases the risk? °This condition is more likely to develop in: °· Older people. °· Infants. °· People who take blood thinners. °· People who have injured their head. °· People who abuse alcohol. ° °What are the signs or symptoms? °Depending on the size of the hematoma, symptoms can vary from mild to severe and life-threatening. Symptoms in acute subdural hematoma can develop over minutes or hours. Symptoms in chronic subdural hematoma may develop over weeks or months. °· Headaches. °· Nausea or vomiting. °· Changes in vision, such as double vision or loss of vision. °· Changes in speech. °· Loss of balance or difficulty walking. °· Weakness, numbness, or tingling in the arms or legs on one side of the body. °· Jerky movements that you cannot control (seizures). °· Change in personality. °· Increased sleepiness. °· Memory loss. °· Loss of consciousness. °· Coma. ° °How is this diagnosed? °This condition is diagnosed  based on the results of: °· A physical and neurological exam. °· CT scan. °· MRI. ° °How is this treated? °Treatment for this condition depends on the severity and the type of subdural hematoma that you have. You may need to temporarily stop taking blood thinners, if this applies. You may be given antiseizure (anticonvulsant) medicine. °Treatment for acute subdural hematoma may include: °· Medicines that help the body get rid of excess fluids (diuretics). These may help reduce pressure in the brain. °· Assisted breathing (ventilation). This involves using a machine called a ventilator to help you breathe. This helps to reduce pressure in the brain, especially if there is swelling of the brain. °· Emergency surgery to drain blood or remove a blood clot. ° °Treatment for chronic subdural hematoma may include: °· Observation and bed rest at the hospital. °· Emergency surgery. This may be done if the bleeding is large, or if you have neurological symptoms such as weakness or numbness. ° °Sometimes, no treatment is needed for chronic subdural hematoma. °Follow these instructions at home: °Activity °· Avoid any situation where there is potential for another head injury, such as football, hockey, soccer, basketball, martial arts, downhill snow sports, and horseback riding. Do not do these activities until your health care provider approves. °? If you play a contact sport and you experience a head injury, follow advice from your health care provider about when you can return to the sport. If you get another injury while you are healing, you may experience another hemorrhage. °· Avoid excessive visual stimulation while recovering. This includes working   on the computer, watching TV, and reading.  Try to avoid activities that cause physical or mental stress. Stay home from work or school as directed by your health care provider.  Do not drive, ride a bicycle, or use heavy machinery until your health care provider  approves.  Do not lift anything that is heavier than 5 lb (2.3 kg) until your health care provider approves.  If physical therapy was prescribed, do exercises as told by your health care provider or physical therapist.  Rest as told by your health care provider. Rest helps the brain to heal.  Make sure you: ? Get plenty of sleep. Avoid staying up late at night. ? Keep a consistent sleep schedule. Try to go to sleep and wake up at about the same time every day. General instructions  Recovery from brain injuries varies widely. Talk with your health care provider about what to expect. Monitor your symptoms, and ask people around you to do the same.  Take over-the-counter and prescription medicines only as told by your health care provider. Do not take blood thinners or NSAIDs unless your health care provider approves. This includes aspirin, ibuprofen, naproxen, and warfarin.  Limit alcohol intake to no more than 1 drink per day for nonpregnant women and 2 drinks per day for men. One drink equals 12 oz of beer, 5 oz of wine, or 1 oz of hard liquor.  Keep all follow-up visits as told by your health care provider. This is important. How is this prevented?  Wear protective gear, such as helmets, when participating in activities such as biking or contact sports.  Always wear a seat belt when you are in a motor vehicle.  Keep your home environment safe to reduce the risk of falling: ? Remove clutter and tripping hazards from floors and stairways, such as loose rugs and extension cords. ? Use grab bars in bathrooms and handrails by stairs. ? Place non-slip mats on floors and in bathtubs. ? Improve lighting in dim areas. Where to find more information:  [employer] of Neurological Disorders and Stroke: MasterBoxes.it  American Association of Neurological Surgeons: http://www.aans.org  American Academy of Neurology (AAN): http://keith.biz/  Brain Injury Association of America:  www.biausa.org Get help right away if:  You develop symptoms of subdural hematoma.  You are taking blood thinners and you fall or you experience minor trauma to the head. If you take any blood thinners, even a very small injury can cause a subdural hematoma. You should get help right away, even if you think your symptoms are mild.  You have a bleeding disorder and you fall or you experience minor trauma to the head.  You experience a head injury and you develop any of the following symptoms: ? Clear fluid draining from your nose or ears. ? Nausea. ? Vomiting. ? Slurred speech. ? Seizures. ? Drowsiness or a decrease in alertness. ? Double vision. ? Numbness or inability to move (paralysis) in any part of your body. ? Difficulty walking or poor coordination. ? Difficulty thinking. ? Confusion or forgetfulness. ? Personality changes. ? Irrational or aggressive behavior. ? A history of heavy alcohol use. These symptoms may represent a serious problem that is an emergency. Do not wait to see if the symptoms will go away. Get medical help right away. Call your local emergency services (911 in the [place] Do not drive yourself to the hospital. Summary  A subdural hematoma is a collection of blood between the brain and its tough outer covering.  Treatment for this condition depends on the severity and the type of subdural hemorrhage that you have.  Symptoms can vary from mild to severe and life-threatening.  Monitor your symptoms, and ask others around you to do the same. This information is not intended to replace advice given to you by your health care provider. Make sure you discuss any questions you have with your health care provider. Document Released: 02/23/2004 Document Revised: 03/12/2016 Document Reviewed: 03/12/2016 Elsevier Interactive Patient Education  2018 [employer].  Rivaroxaban oral tablets What is this medicine? RIVAROXABAN (ri va ROX a ban) is an anticoagulant  (blood thinner). It is used to treat blood clots in the lungs or in the veins. It is also used after knee or hip surgeries to prevent blood clots. It is also used to lower the chance of stroke in people with a medical condition called atrial fibrillation. This medicine may be used for other purposes; ask your health care provider or pharmacist if you have questions. COMMON BRAND NAME(S): Xarelto, Xarelto Starter Pack What should I tell my health care provider before I take this medicine? They need to know if you have any of these conditions: -bleeding disorders -bleeding in the brain -blood in your stools (black or tarry stools) or if you have blood in your vomit -history of stomach bleeding -kidney disease -liver disease -low blood counts, like low white cell, platelet, or red cell counts -recent or planned spinal or epidural procedure -take medicines that treat or prevent blood clots -an unusual or allergic reaction to rivaroxaban, other medicines, foods, dyes, or preservatives -pregnant or trying to get pregnant -breast-feeding How should I use this medicine? Take this medicine by mouth with a glass of water. Follow the directions on the prescription label. Take your medicine at regular intervals. Do not take it more often than directed. Do not stop taking except on your doctor's advice. Stopping this medicine may increase your risk of a blood clot. Be sure to refill your prescription before you run out of medicine. If you are taking this medicine after hip or knee replacement surgery, take it with or without food. If you are taking this medicine for atrial fibrillation, take it with your evening meal. If you are taking this medicine to treat blood clots, take it with food at the same time each day. If you are unable to swallow your tablet, you may crush the tablet and mix it in applesauce. Then, immediately eat the applesauce. You should eat more food right after you eat the applesauce  containing the crushed tablet. Talk to your pediatrician regarding the use of this medicine in children. Special care may be needed. Overdosage: If you think you have taken too much of this medicine contact a poison control center or emergency room at once. NOTE: This medicine is only for you. Do not share this medicine with others. What if I miss a dose? If you take your medicine once a day and miss a dose, take the missed dose as soon as you remember. If you take your medicine twice a day and miss a dose, take the missed dose immediately. In this instance, 2 tablets may be taken at the same time. The next day you should take 1 tablet twice a day as directed. What may interact with this medicine? Do not take this medicine with any of the following medications: -defibrotide This medicine may also interact with the following medications: -aspirin and aspirin-like medicines -certain antibiotics like erythromycin, azithromycin, and clarithromycin -  certain medicines for fungal infections like ketoconazole and itraconazole -certain medicines for irregular heart beat like amiodarone, quinidine, dronedarone -certain medicines for seizures like carbamazepine, phenytoin -certain medicines that treat or prevent blood clots like warfarin, enoxaparin, and dalteparin -conivaptan -diltiazem -felodipine -indinavir -lopinavir; ritonavir -NSAIDS, medicines for pain and inflammation, like ibuprofen or naproxen -ranolazine -rifampin -ritonavir -SNRIs, medicines for depression, like desvenlafaxine, duloxetine, levomilnacipran, venlafaxine -SSRIs, medicines for depression, like citalopram, escitalopram, fluoxetine, fluvoxamine, paroxetine, sertraline -St. John's wort -verapamil This list may not describe all possible interactions. Give your health care provider a list of all the medicines, herbs, non-prescription drugs, or dietary supplements you use. Also tell them if you smoke, drink alcohol, or use  illegal drugs. Some items may interact with your medicine. What should I watch for while using this medicine? Visit your doctor or health care professional for regular checks on your progress. Notify your doctor or health care professional and seek emergency treatment if you develop breathing problems; changes in vision; chest pain; severe, sudden headache; pain, swelling, warmth in the leg; trouble speaking; sudden numbness or weakness of the face, arm or leg. These can be signs that your condition has gotten worse. If you are going to have surgery or other procedure, tell your doctor that you are taking this medicine. What side effects may I notice from receiving this medicine? Side effects that you should report to your doctor or health care professional as soon as possible: -allergic reactions like skin rash, itching or hives, swelling of the face, lips, or tongue -back pain -redness, blistering, peeling or loosening of the skin, including inside the mouth -signs and symptoms of bleeding such as bloody or black, tarry stools; red or dark-brown urine; spitting up blood or brown material that looks like coffee grounds; red spots on the skin; unusual bruising or bleeding from the eye, gums, or nose Side effects that usually do not require medical attention (report to your doctor or health care professional if they continue or are bothersome): -dizziness -muscle pain This list may not describe all possible side effects. Call your doctor for medical advice about side effects. You may report side effects to FDA at 1-800-FDA-1088. Where should I keep my medicine? Keep out of the reach of children. Store at room temperature between 15 and 30 degrees C (59 and 86 degrees F). Throw away any unused medicine after the expiration date. NOTE: This sheet is a summary. It may not cover all possible information. If you have questions about this medicine, talk to your doctor, pharmacist, or health care  provider.  2018 Elsevier/Gold Standard (2015-12-26 16:29:33)

## 2017-03-16 ENCOUNTER — Ambulatory Visit (HOSPITAL_BASED_OUTPATIENT_CLINIC_OR_DEPARTMENT_OTHER): Payer: Medicare Other

## 2017-03-16 ENCOUNTER — Other Ambulatory Visit: Payer: Self-pay

## 2017-03-16 ENCOUNTER — Ambulatory Visit (HOSPITAL_BASED_OUTPATIENT_CLINIC_OR_DEPARTMENT_OTHER): Payer: Medicare Other | Admitting: Hematology and Oncology

## 2017-03-16 VITALS — BP 149/53 | HR 80 | Temp 97.8°F | Resp 18 | Ht 62.0 in | Wt 208.5 lb

## 2017-03-16 DIAGNOSIS — S065X0S Traumatic subdural hemorrhage without loss of consciousness, sequela: Secondary | ICD-10-CM | POA: Diagnosis not present

## 2017-03-16 DIAGNOSIS — C50411 Malignant neoplasm of upper-outer quadrant of right female breast: Secondary | ICD-10-CM

## 2017-03-16 DIAGNOSIS — Z5189 Encounter for other specified aftercare: Secondary | ICD-10-CM | POA: Diagnosis not present

## 2017-03-16 DIAGNOSIS — Z170 Estrogen receptor positive status [ER+]: Secondary | ICD-10-CM

## 2017-03-16 DIAGNOSIS — Z95828 Presence of other vascular implants and grafts: Secondary | ICD-10-CM

## 2017-03-16 DIAGNOSIS — C8331 Diffuse large B-cell lymphoma, lymph nodes of head, face, and neck: Secondary | ICD-10-CM

## 2017-03-16 DIAGNOSIS — T80212S Local infection due to central venous catheter, sequela: Secondary | ICD-10-CM

## 2017-03-16 MED ORDER — SODIUM CHLORIDE 0.9% FLUSH
10.0000 mL | Freq: Once | INTRAVENOUS | Status: AC
  Administered 2017-03-16: 10 mL
  Filled 2017-03-16: qty 10

## 2017-03-16 MED ORDER — SODIUM CHLORIDE 0.9 % IV SOLN
INTRAVENOUS | Status: DC
  Administered 2017-03-16: 13:00:00 via INTRAVENOUS

## 2017-03-16 MED ORDER — HEPARIN SOD (PORK) LOCK FLUSH 100 UNIT/ML IV SOLN
500.0000 [IU] | Freq: Once | INTRAVENOUS | Status: AC
  Administered 2017-03-16: 500 [IU]
  Filled 2017-03-16: qty 5

## 2017-03-16 NOTE — Progress Notes (Signed)
Patient Care Team: Marletta Lor, MD as PCP - General Marcy Panning, MD as Consulting Physician (Internal Medicine) Thea Silversmith, MD (Inactive) as Consulting Physician (Radiation Oncology)  DIAGNOSIS:  Encounter Diagnoses  Name Primary?  . Diffuse large B-cell lymphoma of lymph nodes of neck (HCC) Yes  . 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 Radiation recall related to tamoxifen      11/25/2010 -  Anti-estrogen oral therapy    Aromasin 25 mg daily.myalgias and arthralgias stop November 2012 went back to tamoxifen 20 mg daily      01/06/2017 Pathology Results    Bone marrow biopsy: Slightly hypercellular bone marrow for age with granulocytic hyperplasia, numerous small lymphoid aggregates present. This may represent minimal involvement by low-grade B-cell lymphoma or liver process like SLL/CLL because they are CD5 positive       Diffuse large B cell lymphoma ([place])   12/26/2016 Initial Diagnosis    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 positive for LCA, CD20, CD79a PAX-5, BCL-6 and weak cytoplasmic kappa associated with patchy weak positivity for BCL-2 and CD10, Ki-67 40%      01/16/2017 -  Chemotherapy    R CHOP 6 cycles       CHIEF COMPLIANT: Urgent follow-up because of generalized weakness  INTERVAL HISTORY: Kristen Escobar is a 71 year old with above-mentioned history of  diffuse large B-cell lymphoma who received 3 cycles of chemotherapy.  She was in the hospital recently for neutropenic fever and subsequently was again hospitalized because she fell on her head and had a subdural hematoma.  She was discharged this past Friday.  It appears that she has been throwing up and has every time she throws up she has difficulty with gag reflex.  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  REVIEW OF SYSTEMS:   Constitutional: Denies fevers, chills or abnormal weight loss Eyes: Denies blurriness of vision Ears, nose, mouth, throat, and face: Cough with expectoration whitish sputum Respiratory: Cough Cardiovascular: Denies palpitation, chest discomfort Gastrointestinal:  Denies nausea, heartburn or change in bowel habits Skin: Denies abnormal skin rashes Lymphatics: Denies new lymphadenopathy or easy bruising Neurological: Generalized weakness Behavioral/Psych: Mood is stable, no new changes  Extremities: No lower extremity edema All other systems were reviewed with the patient and are negative.  I have reviewed the past medical history, past surgical history, social history and family history with the patient and they are unchanged from previous note.  ALLERGIES:  has No Known Allergies.  MEDICATIONS:  Current Outpatient Medications  Medication Sig Dispense Refill  . allopurinol (ZYLOPRIM) 300 MG tablet Place 1 tablet (300 mg total) into feeding tube daily.    Marland Kitchen levothyroxine (SYNTHROID, LEVOTHROID) 50 MCG tablet Place 1 tablet (50 mcg total) into feeding tube daily.    Marland Kitchen loperamide (IMODIUM A-D) 2 MG tablet Place 2 mg into feeding tube 4 (four)  times daily as needed for diarrhea or loose stools.    Marland Kitchen LORazepam (ATIVAN) 0.5 MG tablet Place 1 tablet (0.5 mg total) into feeding tube every 6 (six) hours as needed (Nausea or vomiting).    Marland Kitchen omeprazole (PRILOSEC) 20 MG capsule Take 20 mg daily by mouth.    . ondansetron  (ZOFRAN) 8 MG tablet Place 1 tablet (8 mg total) into feeding tube every 8 (eight) hours as needed for refractory nausea / vomiting. Start day 3 after chemo.    . predniSONE (DELTASONE) 20 MG tablet Take 3 tablets (60 mg total) by mouth daily. Take on days 1-5 of chemotherapy. 90 tablet 0  . prochlorperazine (COMPAZINE) 10 MG tablet Place 1 tablet (10 mg total) into feeding tube every 6 (six) hours as needed (Nausea or vomiting).    . rivaroxaban (XARELTO) 20 MG TABS tablet Take 1 tablet (20 mg total) by mouth daily with supper. 30 tablet 0   No current facility-administered medications for this visit.     PHYSICAL EXAMINATION: ECOG PERFORMANCE STATUS: 1 - Symptomatic but completely ambulatory  Vitals:   03/16/17 1038  BP: (!) 149/53  Pulse: 80  Resp: 18  Temp: 97.8 F (36.6 C)  SpO2: 91%   Filed Weights   03/16/17 1038  Weight: 208 lb 8 oz (94.6 kg)    GENERAL:alert, no distress and comfortable SKIN: skin color, texture, turgor are normal, no rashes or significant lesions EYES: normal, Conjunctiva are pink and non-injected, sclera clear OROPHARYNX:no exudate, no erythema and lips, buccal mucosa, and tongue normal  NECK: supple, thyroid normal size, non-tender, without nodularity LYMPH:  no palpable lymphadenopathy in the cervical, axillary or inguinal LUNGS: clear to auscultation and percussion with normal breathing effort HEART: regular rate & rhythm and no murmurs and no lower extremity edema ABDOMEN:abdomen soft, non-tender and normal bowel sounds MUSCULOSKELETAL:no cyanosis of digits and no clubbing  NEURO: alert & oriented x 3 with fluent speech, no focal motor/sensory deficits EXTREMITIES: No lower extremity edema  LABORATORY DATA:  I have reviewed the data as listed   Chemistry      Component Value Date/Time   NA 139 03/13/2017 0926   NA 136 03/05/2017 1102   K 3.8 03/13/2017 0926   K 4.0 03/05/2017 1102   CL 111 03/13/2017 0926   CL 105 03/10/2012 0859   CO2  21 (L) 03/13/2017 0926   CO2 22 03/05/2017 1102   BUN 13 03/13/2017 0926   BUN 15.4 03/05/2017 1102   CREATININE 0.81 03/13/2017 0926   CREATININE 1.0 03/05/2017 1102      Component Value Date/Time   CALCIUM 8.0 (L) 03/13/2017 0926   CALCIUM 8.3 (L) 03/05/2017 1102   ALKPHOS 41 03/13/2017 0926   ALKPHOS 51 03/05/2017 1102   AST 34 03/13/2017 0926   AST 36 (H) 03/05/2017 1102   ALT 54 03/13/2017 0926   ALT 309 (HH) 03/05/2017 1102   BILITOT 0.7 03/13/2017 0926   BILITOT 1.36 (H) 03/05/2017 1102       Lab Results  Component Value Date   WBC 8.9 03/13/2017   HGB 8.0 (L) 03/13/2017   HCT 24.9 (L) 03/13/2017   MCV 89.2 03/13/2017   PLT 337 03/13/2017   NEUTROABS 5.4 03/11/2017    ASSESSMENT & PLAN:  Diffuse large B cell lymphoma (HCC) Diffuse large B cell lymphoma (HCC) 12/26/2016 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  20 mg daily of Xarelto.  Generalized weakness: Because patient has not been able to feed on the tube feeds.  Home health is visiting her.  We will see if they can provide a service to give continuous tube feeds at night and bolus tube feeds through the day.  We discussed multiple options including stopping further chemotherapy.  At this point patient wants to focus on getting some nutrition in and then  determining if her fatigue is too significant to continue with the treatment.  Either way we will plan to obtain CT chest abdomen pelvis this test and follow-up on Friday.  Return to clinic later this week to discuss results of the scans.     I spent 45 minutes talking to the patient of which more than half was spent in counseling and coordination of care.  Orders Placed This Encounter  Procedures  . CT Chest W Contrast    Standing Status:   Future    Standing Expiration Date:   03/16/2018    Order Specific Question:   If indicated for the ordered procedure, I authorize the administration of contrast media per Radiology protocol    Answer:   Yes    Order Specific Question:   Preferred imaging location?    Answer:   [facility]    Order Specific Question:   Radiology Contrast Protocol - do NOT remove file path    Answer:   [email or web address]    Order Specific Question:   Reason for Exam additional comments    Answer:   Lymphoma Neck Mid point of her chemo  . CT Soft Tissue Neck W Contrast    Standing Status:   Future    Standing Expiration Date:   03/16/2018    Order Specific Question:   If indicated for the ordered procedure, I authorize the administration of contrast media per Radiology protocol    Answer:   Yes    Order Specific Question:   Preferred imaging location?    Answer:   [facility]    Order Specific Question:   Radiology Contrast Protocol - do NOT remove file path    Answer:   [email or web address]    Order Specific Question:   Reason for Exam additional comments    Answer:   Lymphoma Neck Mid point of her chemo   The patient has a good understanding of the overall plan. she agrees with it. she will call with any problems that may develop before the next visit here.   Rulon Eisenmenger, MD 03/16/17

## 2017-03-16 NOTE — Patient Instructions (Signed)
Dehydration, Adult Dehydration is a condition in which there is not enough fluid or water in the body. This happens when you lose more fluids than you take in. Important organs, such as the kidneys, brain, and heart, cannot function without a proper amount of fluids. Any loss of fluids from the body can lead to dehydration. Dehydration can range from mild to severe. This condition should be treated right away to prevent it from becoming severe. What are the causes? This condition may be caused by:  Vomiting.  Diarrhea.  Excessive sweating, such as from heat exposure or exercise.  Not drinking enough fluid, especially: ? When ill. ? While doing activity that requires a lot of energy.  Excessive urination.  Fever.  Infection.  Certain medicines, such as medicines that cause the body to lose excess fluid (diuretics).  Inability to access safe drinking water.  Reduced physical ability to get adequate water and food.  What increases the risk? This condition is more likely to develop in people:  Who have a poorly controlled long-term (chronic) illness, such as diabetes, heart disease, or kidney disease.  Who are age 65 or older.  Who are disabled.  Who live in a place with high altitude.  Who play endurance sports.  What are the signs or symptoms? Symptoms of mild dehydration may include:  Thirst.  Dry lips.  Slightly dry mouth.  Dry, warm skin.  Dizziness. Symptoms of moderate dehydration may include:  Very dry mouth.  Muscle cramps.  Dark urine. Urine may be the color of tea.  Decreased urine production.  Decreased tear production.  Heartbeat that is irregular or faster than normal (palpitations).  Headache.  Light-headedness, especially when you stand up from a sitting position.  Fainting (syncope). Symptoms of severe dehydration may include:  Changes in skin, such as: ? Cold and clammy skin. ? Blotchy (mottled) or pale skin. ? Skin that does  not quickly return to normal after being lightly pinched and released (poor skin turgor).  Changes in body fluids, such as: ? Extreme thirst. ? No tear production. ? Inability to sweat when body temperature is high, such as in hot weather. ? Very little urine production.  Changes in vital signs, such as: ? Weak pulse. ? Pulse that is more than 100 beats a minute when sitting still. ? Rapid breathing. ? Low blood pressure.  Other changes, such as: ? Sunken eyes. ? Cold hands and feet. ? Confusion. ? Lack of energy (lethargy). ? Difficulty waking up from sleep. ? Short-term weight loss. ? Unconsciousness. How is this diagnosed? This condition is diagnosed based on your symptoms and a physical exam. Blood and urine tests may be done to help confirm the diagnosis. How is this treated? Treatment for this condition depends on the severity. Mild or moderate dehydration can often be treated at home. Treatment should be started right away. Do not wait until dehydration becomes severe. Severe dehydration is an emergency and it needs to be treated in a hospital. Treatment for mild dehydration may include:  Drinking more fluids.  Replacing salts and minerals in your blood (electrolytes) that you may have lost. Treatment for moderate dehydration may include:  Drinking an oral rehydration solution (ORS). This is a drink that helps you replace fluids and electrolytes (rehydrate). It can be found at pharmacies and retail stores. Treatment for severe dehydration may include:  Receiving fluids through an IV tube.  Receiving an electrolyte solution through a feeding tube that is passed through your nose   and into your stomach (nasogastric tube, or NG tube).  Correcting any abnormalities in electrolytes.  Treating the underlying cause of dehydration. Follow these instructions at home:  If directed by your health care provider, drink an ORS: ? Make an ORS by following instructions on the  package. ? Start by drinking small amounts, about  cup (120 mL) every 5-10 minutes. ? Slowly increase how much you drink until you have taken the amount recommended by your health care provider.  Drink enough clear fluid to keep your urine clear or pale yellow. If you were told to drink an ORS, finish the ORS first, then start slowly drinking other clear fluids. Drink fluids such as: ? Water. Do not drink only water. Doing that can lead to having too little salt (sodium) in the body (hyponatremia). ? Ice chips. ? Fruit juice that you have added water to (diluted fruit juice). ? Low-calorie sports drinks.  Avoid: ? Alcohol. ? Drinks that contain a lot of sugar. These include high-calorie sports drinks, fruit juice that is not diluted, and soda. ? Caffeine. ? Foods that are greasy or contain a lot of fat or sugar.  Take over-the-counter and prescription medicines only as told by your health care provider.  Do not take sodium tablets. This can lead to having too much sodium in the body (hypernatremia).  Eat foods that contain a healthy balance of electrolytes, such as bananas, oranges, potatoes, tomatoes, and spinach.  Keep all follow-up visits as told by your health care provider. This is important. Contact a health care provider if:  You have abdominal pain that: ? Gets worse. ? Stays in one area (localizes).  You have a rash.  You have a stiff neck.  You are more irritable than usual.  You are sleepier or more difficult to wake up than usual.  You feel weak or dizzy.  You feel very thirsty.  You have urinated only a small amount of very dark urine over 6-8 hours. Get help right away if:  You have symptoms of severe dehydration.  You cannot drink fluids without vomiting.  Your symptoms get worse with treatment.  You have a fever.  You have a severe headache.  You have vomiting or diarrhea that: ? Gets worse. ? Does not go away.  You have blood or green matter  (bile) in your vomit.  You have blood in your stool. This may cause stool to look black and tarry.  You have not urinated in 6-8 hours.  You faint.  Your heart rate while sitting still is over 100 beats a minute.  You have trouble breathing. This information is not intended to replace advice given to you by your health care provider. Make sure you discuss any questions you have with your health care provider. Document Released: 04/07/2005 Document Revised: 11/02/2015 Document Reviewed: 06/01/2015 Elsevier Interactive Patient Education  2018 [employer]  

## 2017-03-16 NOTE — Assessment & Plan Note (Signed)
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 positive for LCA, CD20, CD79a PAX-5, BCL-6 and weak cytoplasmic kappa associated with patchy weak positivity for BCL-2 and CD10, Ki-67 40%  Bone marrow biopsy negative for diffuse large B cell lymphoma but suggestive of small low-grade B cell population possibly SLL/CLL Revised IPI score: 1, good prognosis, redictated for years PFS 80%, predicted overall survival 79% ------------------------------------------------------------------------ Current treatment: R CHOP 6 cycles completed 3 cycles  Subdural hematoma: Secondary to the recent fall on blood thinners doing much better PE and DVT: Continue with anticoagulation.  20 mg daily of Xarelto.  Generalized weakness: Because patient has not been able to feed on the tube feeds.  Home health is visiting her.  We will see if they can provide a service to give continuous tube feeds at night and bolus tube feeds through the day.  We discussed multiple options including stopping further chemotherapy.  At this point patient wants to focus on getting some nutrition in and then determining if her fatigue is too significant to continue with the treatment.  Either way we will plan to obtain CT chest abdomen pelvis this test and follow-up on Friday.  Return to clinic later this week to discuss results of the scans.

## 2017-03-17 ENCOUNTER — Telehealth: Payer: Self-pay

## 2017-03-17 DIAGNOSIS — I129 Hypertensive chronic kidney disease with stage 1 through stage 4 chronic kidney disease, or unspecified chronic kidney disease: Secondary | ICD-10-CM | POA: Diagnosis not present

## 2017-03-17 DIAGNOSIS — D631 Anemia in chronic kidney disease: Secondary | ICD-10-CM | POA: Diagnosis not present

## 2017-03-17 DIAGNOSIS — Z431 Encounter for attention to gastrostomy: Secondary | ICD-10-CM | POA: Diagnosis not present

## 2017-03-17 DIAGNOSIS — J860 Pyothorax with fistula: Secondary | ICD-10-CM | POA: Diagnosis not present

## 2017-03-17 DIAGNOSIS — C8330 Diffuse large B-cell lymphoma, unspecified site: Secondary | ICD-10-CM | POA: Diagnosis not present

## 2017-03-17 DIAGNOSIS — N183 Chronic kidney disease, stage 3 (moderate): Secondary | ICD-10-CM | POA: Diagnosis not present

## 2017-03-17 NOTE — Telephone Encounter (Signed)
Received call from [place] for verbal orders for pressure ulcer dressings. Pt was found to have stage 2 pressure ulcer and needing Allevyn foam dressing every 5-7 days or as needed, or hydrocolloid foam dressing. Per Dr.Gudena, give verbal orders for these dressing changes. No further needs at this time.

## 2017-03-18 ENCOUNTER — Telehealth: Payer: Self-pay

## 2017-03-18 NOTE — Telephone Encounter (Signed)
RN returned call to patient's husband as requested. Patient had questions related to patients scheduled CT scans for 11/29. Herbie Baltimore wanted to know if head CT had been added as patient's neurologist, Dr. Saintclair Halsted wanted patient to have head CT prior to patient's office visit with him on 03/24/17. Husband wanted to know if scans could be scheduled together. Patient advised to follow-up with Dr. Windy Carina office and imaging.

## 2017-03-19 ENCOUNTER — Telehealth: Payer: Self-pay | Admitting: *Deleted

## 2017-03-19 ENCOUNTER — Encounter (HOSPITAL_COMMUNITY): Payer: Self-pay

## 2017-03-19 ENCOUNTER — Other Ambulatory Visit (HOSPITAL_COMMUNITY): Payer: Self-pay | Admitting: Student

## 2017-03-19 ENCOUNTER — Ambulatory Visit (HOSPITAL_COMMUNITY)
Admission: RE | Admit: 2017-03-19 | Discharge: 2017-03-19 | Disposition: A | Discharge status: Home / Self Care | Payer: Medicare Other | Source: Ambulatory Visit | Attending: Hematology and Oncology | Admitting: Hematology and Oncology

## 2017-03-19 DIAGNOSIS — I6200 Nontraumatic subdural hemorrhage, unspecified: Secondary | ICD-10-CM | POA: Diagnosis not present

## 2017-03-19 DIAGNOSIS — J3801 Paralysis of vocal cords and larynx, unilateral: Secondary | ICD-10-CM | POA: Diagnosis not present

## 2017-03-19 DIAGNOSIS — I2510 Atherosclerotic heart disease of native coronary artery without angina pectoris: Secondary | ICD-10-CM | POA: Insufficient documentation

## 2017-03-19 DIAGNOSIS — K8020 Calculus of gallbladder without cholecystitis without obstruction: Secondary | ICD-10-CM | POA: Diagnosis not present

## 2017-03-19 DIAGNOSIS — J439 Emphysema, unspecified: Secondary | ICD-10-CM | POA: Diagnosis not present

## 2017-03-19 DIAGNOSIS — S065X9A Traumatic subdural hemorrhage with loss of consciousness of unspecified duration, initial encounter: Secondary | ICD-10-CM

## 2017-03-19 DIAGNOSIS — K449 Diaphragmatic hernia without obstruction or gangrene: Secondary | ICD-10-CM | POA: Insufficient documentation

## 2017-03-19 DIAGNOSIS — I700 Atherosclerosis of aorta: Secondary | ICD-10-CM | POA: Diagnosis not present

## 2017-03-19 DIAGNOSIS — I2699 Other pulmonary embolism without acute cor pulmonale: Secondary | ICD-10-CM | POA: Insufficient documentation

## 2017-03-19 DIAGNOSIS — S065XAA Traumatic subdural hemorrhage with loss of consciousness status unknown, initial encounter: Secondary | ICD-10-CM

## 2017-03-19 DIAGNOSIS — C8331 Diffuse large B-cell lymphoma, lymph nodes of head, face, and neck: Secondary | ICD-10-CM | POA: Insufficient documentation

## 2017-03-19 DIAGNOSIS — C8330 Diffuse large B-cell lymphoma, unspecified site: Secondary | ICD-10-CM | POA: Diagnosis not present

## 2017-03-19 HISTORY — DX: Diffuse large B-cell lymphoma, unspecified site: C83.30

## 2017-03-19 MED ORDER — HEPARIN SOD (PORK) LOCK FLUSH 100 UNIT/ML IV SOLN
500.0000 [IU] | Freq: Once | INTRAVENOUS | Status: AC
  Administered 2017-03-19: 500 [IU] via INTRAVENOUS

## 2017-03-19 MED ORDER — IOPAMIDOL (ISOVUE-300) INJECTION 61%
INTRAVENOUS | Status: AC
  Filled 2017-03-19: qty 100

## 2017-03-19 MED ORDER — IOPAMIDOL (ISOVUE-300) INJECTION 61%
100.0000 mL | Freq: Once | INTRAVENOUS | Status: AC | PRN
  Administered 2017-03-19: 100 mL via INTRAVENOUS

## 2017-03-19 MED ORDER — HEPARIN SOD (PORK) LOCK FLUSH 100 UNIT/ML IV SOLN
INTRAVENOUS | Status: AC
  Filled 2017-03-19: qty 5

## 2017-03-19 NOTE — Telephone Encounter (Signed)
Spoke with patient's husband for hospital follow-up.  Husband stated that patient is overall doing well but is still very weak.  She is using a walker at home now since falling. She has restarted her Xarelto.  She does have a small pressure wound on her sacrum that is being treated with dressings from home health. Her tube feedings have been changed from bolus feedings to continuous feedings, which she seems to be tolerating well.   They have an appointment at the cancer center tomorrow with Dr. Lindi Adie and will have imaging done at that time.  At this time, they do not wish to come in for hospital follow-up visit with Dr. Raliegh Ip and will follow-up with all of her specialists instead. Husband states they discussed with Dr. Raliegh Ip at last visit that the risks of being around sick people in the office may outweigh the benefit of coming in for office visits unless there is an acute issue. He is very appreciative of all of the care Dr. Raliegh Ip has provided to her. They have no needs at this time, just wanted to update Dr. Raliegh Ip.

## 2017-03-20 ENCOUNTER — Ambulatory Visit (HOSPITAL_BASED_OUTPATIENT_CLINIC_OR_DEPARTMENT_OTHER): Payer: Medicare Other | Admitting: Hematology and Oncology

## 2017-03-20 ENCOUNTER — Ambulatory Visit: Payer: Medicare Other

## 2017-03-20 ENCOUNTER — Ambulatory Visit (HOSPITAL_BASED_OUTPATIENT_CLINIC_OR_DEPARTMENT_OTHER): Payer: Medicare Other

## 2017-03-20 ENCOUNTER — Ambulatory Visit: Payer: Medicare Other | Admitting: Nutrition

## 2017-03-20 ENCOUNTER — Other Ambulatory Visit (HOSPITAL_BASED_OUTPATIENT_CLINIC_OR_DEPARTMENT_OTHER): Payer: Medicare Other

## 2017-03-20 ENCOUNTER — Telehealth: Payer: Self-pay

## 2017-03-20 ENCOUNTER — Other Ambulatory Visit: Payer: Self-pay

## 2017-03-20 VITALS — BP 146/47 | HR 73 | Temp 97.8°F | Resp 19

## 2017-03-20 DIAGNOSIS — Z170 Estrogen receptor positive status [ER+]: Principal | ICD-10-CM

## 2017-03-20 DIAGNOSIS — Z5111 Encounter for antineoplastic chemotherapy: Secondary | ICD-10-CM

## 2017-03-20 DIAGNOSIS — T80212S Local infection due to central venous catheter, sequela: Secondary | ICD-10-CM

## 2017-03-20 DIAGNOSIS — Z5112 Encounter for antineoplastic immunotherapy: Secondary | ICD-10-CM

## 2017-03-20 DIAGNOSIS — C50411 Malignant neoplasm of upper-outer quadrant of right female breast: Secondary | ICD-10-CM

## 2017-03-20 DIAGNOSIS — C8331 Diffuse large B-cell lymphoma, lymph nodes of head, face, and neck: Secondary | ICD-10-CM

## 2017-03-20 DIAGNOSIS — Z95828 Presence of other vascular implants and grafts: Secondary | ICD-10-CM

## 2017-03-20 LAB — CBC WITH DIFFERENTIAL/PLATELET
BASO%: 0.4 % (ref 0.0–2.0)
Basophils Absolute: 0 10*3/uL (ref 0.0–0.1)
EOS%: 0.3 % (ref 0.0–7.0)
Eosinophils Absolute: 0 10*3/uL (ref 0.0–0.5)
HCT: 28.8 % — ABNORMAL LOW (ref 34.8–46.6)
HGB: 9 g/dL — ABNORMAL LOW (ref 11.6–15.9)
LYMPH%: 13.2 % — AB (ref 14.0–49.7)
MCH: 29.1 pg (ref 25.1–34.0)
MCHC: 31.3 g/dL — AB (ref 31.5–36.0)
MCV: 93.2 fL (ref 79.5–101.0)
MONO#: 0.4 10*3/uL (ref 0.1–0.9)
MONO%: 4.3 % (ref 0.0–14.0)
NEUT#: 8.1 10*3/uL — ABNORMAL HIGH (ref 1.5–6.5)
NEUT%: 81.8 % — AB (ref 38.4–76.8)
PLATELETS: 421 10*3/uL — AB (ref 145–400)
RBC: 3.09 10*6/uL — AB (ref 3.70–5.45)
RDW: 21.5 % — ABNORMAL HIGH (ref 11.2–14.5)
WBC: 9.9 10*3/uL (ref 3.9–10.3)
lymph#: 1.3 10*3/uL (ref 0.9–3.3)

## 2017-03-20 LAB — COMPREHENSIVE METABOLIC PANEL
ALBUMIN: 2.4 g/dL — AB (ref 3.5–5.0)
ALK PHOS: 48 U/L (ref 40–150)
ALT: 31 U/L (ref 0–55)
AST: 28 U/L (ref 5–34)
Anion Gap: 8 mEq/L (ref 3–11)
BILIRUBIN TOTAL: 0.54 mg/dL (ref 0.20–1.20)
BUN: 8.4 mg/dL (ref 7.0–26.0)
CO2: 24 mEq/L (ref 22–29)
CREATININE: 0.8 mg/dL (ref 0.6–1.1)
Calcium: 8.3 mg/dL — ABNORMAL LOW (ref 8.4–10.4)
Chloride: 110 mEq/L — ABNORMAL HIGH (ref 98–109)
EGFR: >60 mL/min/{1.73_m2} (ref >60)
GLUCOSE: 107 mg/dL (ref 70–140)
Potassium: 3.6 mEq/L (ref 3.5–5.1)
SODIUM: 142 meq/L (ref 136–145)
TOTAL PROTEIN: 5.6 g/dL — AB (ref 6.4–8.3)

## 2017-03-20 MED ORDER — SODIUM CHLORIDE 0.9 % IV SOLN
400.0000 mg/m2 | Freq: Once | INTRAVENOUS | Status: AC
  Administered 2017-03-20: 860 mg via INTRAVENOUS
  Filled 2017-03-20: qty 43

## 2017-03-20 MED ORDER — HEPARIN SOD (PORK) LOCK FLUSH 100 UNIT/ML IV SOLN
500.0000 [IU] | Freq: Once | INTRAVENOUS | Status: AC | PRN
  Administered 2017-03-20: 500 [IU]
  Filled 2017-03-20: qty 5

## 2017-03-20 MED ORDER — FUROSEMIDE 20 MG PO TABS: 20.0000 mg | ORAL_TABLET | Freq: Every day | ORAL | 1 refills | Status: DC

## 2017-03-20 MED ORDER — PEGFILGRASTIM 6 MG/0.6ML ~~LOC~~ PSKT
6.0000 mg | PREFILLED_SYRINGE | Freq: Once | SUBCUTANEOUS | Status: AC
  Administered 2017-03-20: 6 mg via SUBCUTANEOUS
  Filled 2017-03-20: qty 0.6

## 2017-03-20 MED ORDER — SODIUM CHLORIDE 0.9% FLUSH
10.0000 mL | Freq: Once | INTRAVENOUS | Status: AC
  Administered 2017-03-20: 10 mL
  Filled 2017-03-20: qty 10

## 2017-03-20 MED ORDER — ACETAMINOPHEN 325 MG PO TABS: 650.0000 mg | ORAL_TABLET | Freq: Once | ORAL | Status: DC

## 2017-03-20 MED ORDER — SODIUM CHLORIDE 0.9 % IV SOLN
375.0000 mg/m2 | Freq: Once | INTRAVENOUS | Status: AC
  Administered 2017-03-20: 800 mg via INTRAVENOUS
  Filled 2017-03-20: qty 50

## 2017-03-20 MED ORDER — SODIUM CHLORIDE 0.9% FLUSH
10.0000 mL | INTRAVENOUS | Status: DC | PRN
  Administered 2017-03-20: 10 mL
  Filled 2017-03-20: qty 10

## 2017-03-20 MED ORDER — DOXORUBICIN HCL CHEMO IV INJECTION 2 MG/ML
30.0000 mg/m2 | Freq: Once | INTRAVENOUS | Status: AC
  Administered 2017-03-20: 66 mg via INTRAVENOUS
  Filled 2017-03-20: qty 33

## 2017-03-20 MED ORDER — DEXAMETHASONE SODIUM PHOSPHATE 10 MG/ML IJ SOLN
10.0000 mg | Freq: Once | INTRAMUSCULAR | Status: AC
  Administered 2017-03-20: 10 mg via INTRAVENOUS

## 2017-03-20 MED ORDER — CIPROFLOXACIN HCL 500 MG PO TABS: 500.0000 mg | ORAL_TABLET | Freq: Two times a day (BID) | ORAL | 1 refills | Status: DC

## 2017-03-20 MED ORDER — SODIUM CHLORIDE 0.9 % IV SOLN
Freq: Once | INTRAVENOUS | Status: AC
  Administered 2017-03-20: 10:00:00 via INTRAVENOUS

## 2017-03-20 MED ORDER — PALONOSETRON HCL INJECTION 0.25 MG/5ML
INTRAVENOUS | Status: AC
  Filled 2017-03-20: qty 5

## 2017-03-20 MED ORDER — DIPHENHYDRAMINE HCL 25 MG PO CAPS: 50.0000 mg | ORAL_CAPSULE | Freq: Once | ORAL | Status: DC

## 2017-03-20 MED ORDER — ACETAMINOPHEN 160 MG/5ML PO SOLN
650.0000 mg | Freq: Once | ORAL | Status: AC
  Administered 2017-03-20: 650 mg via ORAL
  Filled 2017-03-20: qty 20.3

## 2017-03-20 MED ORDER — PALONOSETRON HCL INJECTION 0.25 MG/5ML
0.2500 mg | Freq: Once | INTRAVENOUS | Status: AC
  Administered 2017-03-20: 0.25 mg via INTRAVENOUS

## 2017-03-20 MED ORDER — DIPHENHYDRAMINE HCL 12.5 MG/5ML PO ELIX
50.0000 mg | ORAL_SOLUTION | Freq: Once | ORAL | Status: AC
  Administered 2017-03-20: 50 mg via ORAL
  Filled 2017-03-20: qty 20

## 2017-03-20 MED ORDER — VINCRISTINE SULFATE CHEMO INJECTION 1 MG/ML
1.0000 mg | Freq: Once | INTRAVENOUS | Status: AC
  Administered 2017-03-20: 1 mg via INTRAVENOUS
  Filled 2017-03-20: qty 1

## 2017-03-20 MED ORDER — DEXAMETHASONE SODIUM PHOSPHATE 10 MG/ML IJ SOLN
INTRAMUSCULAR | Status: AC
  Filled 2017-03-20: qty 1

## 2017-03-20 MED ORDER — ENOXAPARIN SODIUM 80 MG/0.8ML ~~LOC~~ SOLN: 80.0000 mg | Freq: Two times a day (BID) | SUBCUTANEOUS | 60 refills | Status: DC

## 2017-03-20 NOTE — Telephone Encounter (Signed)
No los per 11/30. °

## 2017-03-20 NOTE — Assessment & Plan Note (Signed)
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 positive for LCA, CD20, CD79a PAX-5, BCL-6 and weak cytoplasmic kappa associated with patchy weak positivity for BCL-2 and CD10, Ki-67 40%  Stage IIb (bilateral cervical, esophageal, mediastinal, supraclavicular nodes)  Bone marrow biopsy negative for diffuse large B cell lymphoma but suggestive of small low-grade B cell population possibly SLL/CLL  Revised IPI score: 1, good prognosis, redictated for years PFS 80%, predicted overall survival 79% ------------------------------------------------------------------------ Current treatment: R CHOP 6 cycles completed 3 cycles  Subdural hematoma: Secondary to the fall PE and DVT: Continue with anticoagulation.  20 mg daily of Xarelto.  Generalized weakness: Due to poor nutrition  03/19/2017 CT chest abdomen pelvis: Revealed marked improvement in the lymphadenopathy, residual lymph nodes measured 2 mm in 6 mm; Resolution of mediastinal, supraclavicular lymph nodes.  Tree-in-bud appearance of the lungs could be due to aspiration.  Radiology review I discussed the radiology images with the patient and recommended that we need to start looking at surgical treatment options for the fistula.  In the meantime we will continue with and try to finish 2 more cycles of chemo and stop it at 5 cycles.

## 2017-03-20 NOTE — Progress Notes (Signed)
Patient Care Team: Marletta Lor, MD as PCP - General Marcy Panning, MD as Consulting Physician (Internal Medicine) Thea Silversmith, MD (Inactive) as Consulting Physician (Radiation Oncology)  DIAGNOSIS:  Encounter Diagnosis  Name Primary?  . 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 Radiation recall related to tamoxifen      11/25/2010 -  Anti-estrogen oral therapy    Aromasin 25 mg daily.myalgias and arthralgias stop November 2012 went back to tamoxifen 20 mg daily      01/06/2017 Pathology Results    Bone marrow biopsy: Slightly hypercellular bone marrow for age with granulocytic hyperplasia, numerous small lymphoid aggregates present. 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 positive for LCA, CD20, CD79a PAX-5, BCL-6 and weak cytoplasmic kappa associated with patchy weak positivity for BCL-2 and CD10, Ki-67 40%      01/16/2017 -  Chemotherapy    R CHOP 6 cycles       CHIEF COMPLIANT: Follow-up to review scans after 3 cycles of R CHOP today is cycle 4  INTERVAL HISTORY: Kristen Escobar is a 71 year old with above-mentioned history of stage IIb diffuse large B-cell lymphoma currently on R CHOP chemotherapy.  Today is cycle 4  of R CHOP.  She has a tracheoesophageal fistula which has been causing tremendous amount of problems because of regurgitation of PEG tube feeds.  Since we changed her feeds to the pump at night her regurgitation has completely subsided.  She is able to tolerate more more of these continuous feeds through the PEG tube.  She denies any fevers or chills.  The cough has improved.  She complains of bilateral lower extremity swelling.  She had CT head as well as chest and is here today to discuss the results.  REVIEW OF SYSTEMS:   Constitutional: Denies fevers, chills or abnormal weight loss Eyes: Denies blurriness of vision Ears, nose, mouth, throat, and face: Denies mucositis or sore throat Respiratory: Cough expectoration Cardiovascular: Denies palpitation, chest discomfort Gastrointestinal: PEG tube Skin: Denies abnormal skin rashes Lymphatics: Denies new lymphadenopathy or easy bruising Neurological:Denies numbness, tingling or new weaknesses Behavioral/Psych: Mood is stable, no new changes  Extremities: 2+ lower extremity edema  All other systems were reviewed with the patient and are negative.  I have reviewed the past medical history, past surgical history, social history and family history with the patient and they are unchanged from previous note.  ALLERGIES:  has No Known Allergies.  MEDICATIONS:  Current Outpatient Medications  Medication Sig Dispense Refill  . allopurinol (ZYLOPRIM) 300 MG tablet Place 1 tablet (300 mg total) into feeding tube daily.    . ciprofloxacin (CIPRO) 500 MG tablet Take 1 tablet (500 mg total) by mouth 2 (two) times  daily. 30 tablet 1  . enoxaparin (LOVENOX) 80 MG/0.8ML injection Inject 0.8 mLs (80 mg total) into the skin every 12 (twelve) hours. 22.4 Syringe 60  . furosemide (LASIX) 20 MG tablet Take 1 tablet (20 mg total) by mouth daily. 30 tablet 1  . levothyroxine (SYNTHROID, LEVOTHROID) 50 MCG tablet Place 1 tablet (50 mcg total) into feeding tube  daily.    Marland Kitchen loperamide (IMODIUM A-D) 2 MG tablet Place 2 mg into feeding tube 4 (four) times daily as needed for diarrhea or loose stools.    Marland Kitchen LORazepam (ATIVAN) 0.5 MG tablet Place 1 tablet (0.5 mg total) into feeding tube every 6 (six) hours as needed (Nausea or vomiting).    Marland Kitchen omeprazole (PRILOSEC) 20 MG capsule Take 20 mg daily by mouth.    . ondansetron (ZOFRAN) 8 MG tablet Place 1 tablet (8 mg total) into feeding tube every 8 (eight) hours as needed for refractory nausea / vomiting. Start day 3 after chemo.    . predniSONE (DELTASONE) 20 MG tablet Take 3 tablets (60 mg total) by mouth daily. Take on days 1-5 of chemotherapy. 90 tablet 0  . prochlorperazine (COMPAZINE) 10 MG tablet Place 1 tablet (10 mg total) into feeding tube every 6 (six) hours as needed (Nausea or vomiting).     No current facility-administered medications for this visit.    Facility-Administered Medications Ordered in Other Visits  Medication Dose Route Frequency Provider Last Rate Last Dose  . cyclophosphamide (CYTOXAN) 860 mg in sodium chloride 0.9 % 250 mL chemo infusion  400 mg/m2 (Treatment Plan Recorded) Intravenous Once Nicholas Lose, MD      . heparin lock flush 100 unit/mL  500 Units Intracatheter Once PRN Nicholas Lose, MD      . pegfilgrastim (NEULASTA ONPRO KIT) injection 6 mg  6 mg Subcutaneous Once Nicholas Lose, MD      . riTUXimab (RITUXAN) 800 mg in sodium chloride 0.9 % 170 mL chemo infusion  375 mg/m2 (Treatment Plan Recorded) Intravenous Once Nicholas Lose, MD      . sodium chloride flush (NS) 0.9 % injection 10 mL  10 mL Intracatheter PRN Nicholas Lose, MD        PHYSICAL EXAMINATION: ECOG PERFORMANCE STATUS: 2 - Symptomatic, <50% confined to bed  Vitals:   03/20/17 0848  BP: (!) 147/70  Pulse: 85  Resp: 17  Temp: 98.6 F (37 C)  SpO2: 96%   Filed Weights   03/20/17 0848  Weight: 208 lb 12.8 oz (94.7 kg)    GENERAL:alert, no distress and comfortable SKIN: skin color, texture,  turgor are normal, no rashes or significant lesions EYES: normal, Conjunctiva are pink and non-injected, sclera clear OROPHARYNX:no exudate, no erythema and lips, buccal mucosa, and tongue normal  NECK: supple, thyroid normal size, non-tender, without nodularity LYMPH:  no palpable lymphadenopathy in the cervical, axillary or inguinal LUNGS: clear to auscultation and percussion with normal breathing effort HEART: regular rate & rhythm and no murmurs and no lower extremity edema ABDOMEN: PEG tube in place MUSCULOSKELETAL:no cyanosis of digits and no clubbing  NEURO: alert & oriented x 3 with fluent speech, no focal motor/sensory deficits EXTREMITIES: 2+ lower extremity edema  LABORATORY DATA:  I have reviewed the data as listed   Chemistry      Component Value Date/Time   NA 142 03/20/2017 0827   K 3.6 03/20/2017 0827   CL 111 03/13/2017 0926   CL 105 03/10/2012 0859   CO2 24 03/20/2017 0827   BUN  8.4 03/20/2017 0827   CREATININE 0.8 03/20/2017 0827      Component Value Date/Time   CALCIUM 8.3 (L) 03/20/2017 0827   ALKPHOS 48 03/20/2017 0827   AST 28 03/20/2017 0827   ALT 31 03/20/2017 0827   BILITOT 0.54 03/20/2017 0827       Lab Results  Component Value Date   WBC 9.9 03/20/2017   HGB 9.0 (L) 03/20/2017   HCT 28.8 (L) 03/20/2017   MCV 93.2 03/20/2017   PLT 421 (H) 03/20/2017   NEUTROABS 8.1 (H) 03/20/2017    ASSESSMENT & PLAN:  Diffuse large B cell lymphoma ([facility]) 12/26/2016 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 positive for LCA, CD20, CD79a PAX-5, BCL-6 and weak cytoplasmic kappa associated with patchy weak positivity for BCL-2 and CD10, Ki-67 40%  Stage IIb (bilateral cervical, esophageal, mediastinal, supraclavicular nodes)  Bone marrow biopsy negative for diffuse large B cell lymphoma but suggestive of small low-grade B cell population possibly SLL/CLL  Revised IPI score: 1, good prognosis, redictated for years PFS 80%,  predicted overall survival 79% ------------------------------------------------------------------------ Current treatment: R CHOP 6 cycles completed 3 cycles, today cycle 4  Subdural hematoma: Secondary to the fall PE and DVT: Continue with anticoagulation.  20 mg daily of Xarelto.  Generalized weakness: Due to poor nutrition, slowly improving  03/19/2017 CT chest abdomen pelvis: Revealed marked improvement in the lymphadenopathy, residual lymph nodes measured 2 mm in 6 mm; Resolution of mediastinal, supraclavicular lymph nodes.  Tree-in-bud appearance of the lungs could be due to aspiration.  Radiology review I discussed the radiology images with the patient and recommended that we need to start looking at surgical treatment options for the fistula.  In the meantime we will continue with and try to finish 2 more cycles of chemo and stop it at 5 cycles.    Plan: 1.  Reduce dosage of chemotherapy for the next 2 cycles. 2 I spoke with Dr. Constance Holster.  Who will contact the surgeon at [facility] to evaluate her T-E fistula 3.  Extensive pulmonary emboli: We discontinued Xarelto and started her on Lovenox injection 4.  Bilateral aspiration pneumonia-like changes: I did give her a prescription for ciprofloxacin  Our plan is to make sure that she can get her TE fistula repaired after the chemo is completed in 6 weeks.  I spent 45 minutes talking to the patient of which more than half was spent in counseling and coordination of care.  No orders of the defined types were placed in this encounter.  The patient has a good understanding of the overall plan. she agrees with it. she will call with any problems that may develop before the next visit here.   Rulon Eisenmenger, MD 03/20/17

## 2017-03-20 NOTE — Progress Notes (Signed)
Nutrition follow-up completed with patient and husband during treatment for B-cell lymphoma. Weight documented as 208.8 pounds November 30.  This is reflecting patient's edema. Labs reviewed and noted albumin 2.4. Patient reports good tolerance of Osmolite 1.5 via continuous feeding.  Currently Osmolite 1.5 is at 30 cc an hour.  She is running tube feeding over 24 hours. Patient also receiving 30 mL of free water flushes every hour. Patient and husband are very fearful about advancing tube feedings secondary to problems patient had prior to admission. Patient is not having any difficulty tolerating tube feeding advancement at this time.  Patient and husband agree to advance tube feeding 5 mL daily.  They would like to hold tube feedings at 50 mL an hour over 24 hours providing 5 bottles of Osmolite 1.5. Is provides 1675 cal, 74.5 g protein, 905 mL free water.  Patient also receiving 720 cc free water through feeding pump.  This provides 82% minimum calorie needs and 68% minimum protein needs. She is not eating by mouth secondary to fistula.  Estimated nutrition needs: 2000-2200 cal, 110-120 grams protein, 2.2 L fluid.  Nutrition diagnosis: Unintended weight loss cannot be evaluated second 2 edema. Severe malnutrition continues.  Intervention: Patient educated to continue advancing tube feeding as tolerated. Encouraged patient and husband to consider increasing tube feeding to a minimum of 6 bottles of Osmolite 1.5 daily/60 mL over 24 hours. Educated patient and husband on the importance of flushing feeding tube after giving medications.  Encouraged 30-40 cc minimum water flush after medications using plunger. Questions were answered.  Teach back method used.  Monitoring, evaluation, goals: Patient will tolerate tube feeding advancement.  Next visit: To be scheduled as needed.  **Disclaimer: This note was dictated with voice recognition software. Similar sounding words can inadvertently be  transcribed and this note may contain transcription errors which may not have been corrected upon publication of note.**

## 2017-03-20 NOTE — Patient Instructions (Signed)
[facility] Discharge Instructions for Patients Receiving Chemotherapy  Today you received the following chemotherapy agents:  Adriamycin, Vincristine, Cytoxan, and Rituxan.  To help prevent nausea and vomiting after your treatment, we encourage you to take your nausea medication as directed.   If you develop nausea and vomiting that is not controlled by your nausea medication, call the clinic.   BELOW ARE SYMPTOMS THAT SHOULD BE REPORTED IMMEDIATELY:  *FEVER GREATER THAN 100.5 F  *CHILLS WITH OR WITHOUT FEVER  NAUSEA AND VOMITING THAT IS NOT CONTROLLED WITH YOUR NAUSEA MEDICATION  *UNUSUAL SHORTNESS OF BREATH  *UNUSUAL BRUISING OR BLEEDING  TENDERNESS IN MOUTH AND THROAT WITH OR WITHOUT PRESENCE OF ULCERS  *URINARY PROBLEMS  *BOWEL PROBLEMS  UNUSUAL RASH Items with * indicate a potential emergency and should be followed up as soon as possible.  Feel free to call the clinic should you have any questions or concerns. The clinic phone number is [phone].  Please show the CHEMO ALERT CARD at check-in to the Emergency Department and triage nurse.   

## 2017-03-23 ENCOUNTER — Telehealth: Payer: Self-pay

## 2017-03-23 NOTE — Telephone Encounter (Signed)
Called [facility] RN verbal orders for lovenox injection teaching were given per Dr. Lindi Adie. Pt is starting 80mg  lovenox every 12 hours. Husband called and said they have the syringes available.  Cyndia Bent RN

## 2017-03-24 ENCOUNTER — Telehealth: Payer: Self-pay

## 2017-03-24 ENCOUNTER — Encounter: Payer: Medicare Other | Admitting: Internal Medicine

## 2017-03-24 DIAGNOSIS — D631 Anemia in chronic kidney disease: Secondary | ICD-10-CM | POA: Diagnosis not present

## 2017-03-24 DIAGNOSIS — S065X9A Traumatic subdural hemorrhage with loss of consciousness of unspecified duration, initial encounter: Secondary | ICD-10-CM | POA: Diagnosis not present

## 2017-03-24 DIAGNOSIS — N183 Chronic kidney disease, stage 3 (moderate): Secondary | ICD-10-CM | POA: Diagnosis not present

## 2017-03-24 DIAGNOSIS — Z431 Encounter for attention to gastrostomy: Secondary | ICD-10-CM | POA: Diagnosis not present

## 2017-03-24 DIAGNOSIS — J860 Pyothorax with fistula: Secondary | ICD-10-CM | POA: Diagnosis not present

## 2017-03-24 DIAGNOSIS — C8330 Diffuse large B-cell lymphoma, unspecified site: Secondary | ICD-10-CM | POA: Diagnosis not present

## 2017-03-24 DIAGNOSIS — I129 Hypertensive chronic kidney disease with stage 1 through stage 4 chronic kidney disease, or unspecified chronic kidney disease: Secondary | ICD-10-CM | POA: Diagnosis not present

## 2017-03-24 NOTE — Telephone Encounter (Signed)
Patty RN with [facility] called for VO for care of Stage 2 left buttock pressure ulcer. VO given to use Allevyn foam per [facility] pressure ulcer protocol

## 2017-03-26 ENCOUNTER — Telehealth: Payer: Self-pay

## 2017-03-26 NOTE — Telephone Encounter (Signed)
Returned pt VM regarding spouse appt at [facility] next Wednesday. Ensured Mr. Mungin that we use the Epic system and I would also make Dr. Lindi Adie aware of the appt.  Cyndia Bent RN

## 2017-03-27 ENCOUNTER — Telehealth: Payer: Self-pay

## 2017-03-27 NOTE — Telephone Encounter (Signed)
Returned Mr. Klus phone call regarding what to do if Mrs. Drolet develops a fever over the weekend. I explained to him that she could take tylenol 650mg  q8hr prn at she has previously been told. But if she was to develop a fever we would still need to know and to call us. She is currently on Cipro. She is currently doing well with no fevers.  Cyndia Bent RN

## 2017-03-31 ENCOUNTER — Telehealth: Payer: Self-pay

## 2017-03-31 DIAGNOSIS — D631 Anemia in chronic kidney disease: Secondary | ICD-10-CM | POA: Diagnosis not present

## 2017-03-31 DIAGNOSIS — C8330 Diffuse large B-cell lymphoma, unspecified site: Secondary | ICD-10-CM | POA: Diagnosis not present

## 2017-03-31 DIAGNOSIS — N183 Chronic kidney disease, stage 3 (moderate): Secondary | ICD-10-CM | POA: Diagnosis not present

## 2017-03-31 DIAGNOSIS — Z431 Encounter for attention to gastrostomy: Secondary | ICD-10-CM | POA: Diagnosis not present

## 2017-03-31 DIAGNOSIS — J860 Pyothorax with fistula: Secondary | ICD-10-CM | POA: Diagnosis not present

## 2017-03-31 DIAGNOSIS — I129 Hypertensive chronic kidney disease with stage 1 through stage 4 chronic kidney disease, or unspecified chronic kidney disease: Secondary | ICD-10-CM | POA: Diagnosis not present

## 2017-03-31 NOTE — Telephone Encounter (Signed)
Patty with [facility] called about increased swelling in BLE. Lasix 20 mg daily was not started, the husband was anxious about giving it d/t numbers of times she would have to go to [facility]. He said he will start it tomorrow. He will also get some depends. Redness is present below shins. 52 ml /hr peg feeding as well as water 30 ml every hour. Do these need decreasing. Lungs are clear. Stage 2 on sacral area. She is asking for order for hydrocolloid.   Per Patty Next f/u appt is next Friday. Per chart next appt is 08/04/17.  Does she need sooner appt.   S/w Dr Lindi Adie, she needs to use her lasix. She needs appt 3 weeks from last chemo.   lvm reinforcing need for lasix. Expect call to set the appt for 12/21. Lab/md/infusion

## 2017-04-01 ENCOUNTER — Telehealth: Payer: Self-pay | Admitting: Hematology and Oncology

## 2017-04-01 ENCOUNTER — Telehealth: Payer: Self-pay | Admitting: Nutrition

## 2017-04-01 DIAGNOSIS — Z79899 Other long term (current) drug therapy: Secondary | ICD-10-CM | POA: Diagnosis not present

## 2017-04-01 DIAGNOSIS — Z87891 Personal history of nicotine dependence: Secondary | ICD-10-CM | POA: Diagnosis not present

## 2017-04-01 DIAGNOSIS — J860 Pyothorax with fistula: Secondary | ICD-10-CM | POA: Diagnosis not present

## 2017-04-01 DIAGNOSIS — J9504 Tracheo-esophageal fistula following tracheostomy: Secondary | ICD-10-CM | POA: Diagnosis not present

## 2017-04-01 DIAGNOSIS — E7800 Pure hypercholesterolemia, unspecified: Secondary | ICD-10-CM | POA: Diagnosis not present

## 2017-04-01 DIAGNOSIS — C8590 Non-Hodgkin lymphoma, unspecified, unspecified site: Secondary | ICD-10-CM | POA: Diagnosis not present

## 2017-04-01 DIAGNOSIS — I10 Essential (primary) hypertension: Secondary | ICD-10-CM | POA: Diagnosis not present

## 2017-04-01 DIAGNOSIS — J3801 Paralysis of vocal cords and larynx, unilateral: Secondary | ICD-10-CM | POA: Diagnosis not present

## 2017-04-01 NOTE — Telephone Encounter (Signed)
Contacted patient by telephone to f/u on TF tolerance. Had to leave a message for patient/husband to return call.

## 2017-04-01 NOTE — Telephone Encounter (Signed)
Scheduled appt per 12/11 sch message from [place] message back to MD and Rn to see when they would like patient to come in for f/u - [facility] is not here on 12/21. Will contact patient when appts scheduled .

## 2017-04-02 ENCOUNTER — Telehealth: Payer: Self-pay

## 2017-04-02 NOTE — Telephone Encounter (Signed)
Spoke with pt husband regarding lasix not improving pt leg swelling. She is currently on 20 daily po. Home health suggesting if pt may increase dose to 40 to see if it helps with pt swelling. Discussed with Dr.Gudena and okay to do 20mg  po BID. Pt husband verbalized understanding and will try that today.   Herbie Baltimore would like to discuss updates with Dr.Gudena about pt appt at [facility] recently. Dr.Gudena spoke with husband and has no further needs at this time.

## 2017-04-07 ENCOUNTER — Telehealth: Payer: Self-pay | Admitting: Hematology and Oncology

## 2017-04-07 ENCOUNTER — Telehealth: Payer: Self-pay

## 2017-04-07 DIAGNOSIS — J860 Pyothorax with fistula: Secondary | ICD-10-CM | POA: Diagnosis not present

## 2017-04-07 DIAGNOSIS — Z431 Encounter for attention to gastrostomy: Secondary | ICD-10-CM | POA: Diagnosis not present

## 2017-04-07 DIAGNOSIS — I129 Hypertensive chronic kidney disease with stage 1 through stage 4 chronic kidney disease, or unspecified chronic kidney disease: Secondary | ICD-10-CM | POA: Diagnosis not present

## 2017-04-07 DIAGNOSIS — D631 Anemia in chronic kidney disease: Secondary | ICD-10-CM | POA: Diagnosis not present

## 2017-04-07 DIAGNOSIS — C8330 Diffuse large B-cell lymphoma, unspecified site: Secondary | ICD-10-CM | POA: Diagnosis not present

## 2017-04-07 DIAGNOSIS — N183 Chronic kidney disease, stage 3 (moderate): Secondary | ICD-10-CM | POA: Diagnosis not present

## 2017-04-07 NOTE — Telephone Encounter (Signed)
Added f/u appt for 12/21 appt - called to confirm appt with patient - patients husband confused about appt time - request to talk to Rn - transferred call to [facility].

## 2017-04-07 NOTE — Telephone Encounter (Signed)
Mr. Frew called to inform Dr. Lindi Adie that Mrs. Sivertsen will be seeing a voice specialist tomorrow for possible injections to stimulate vocal cords. He also wanted to verify that Dr. Lindi Adie would not be in the office on Friday and I explained to him he would be on Christmas vacation at the time but they were scheduled to see Mendel Ryder, NP.  Cyndia Bent RN

## 2017-04-08 DIAGNOSIS — E7800 Pure hypercholesterolemia, unspecified: Secondary | ICD-10-CM | POA: Diagnosis not present

## 2017-04-08 DIAGNOSIS — J3801 Paralysis of vocal cords and larynx, unilateral: Secondary | ICD-10-CM | POA: Diagnosis not present

## 2017-04-08 DIAGNOSIS — Z9181 History of falling: Secondary | ICD-10-CM | POA: Diagnosis not present

## 2017-04-08 DIAGNOSIS — Z7901 Long term (current) use of anticoagulants: Secondary | ICD-10-CM | POA: Diagnosis not present

## 2017-04-08 DIAGNOSIS — I10 Essential (primary) hypertension: Secondary | ICD-10-CM | POA: Diagnosis not present

## 2017-04-08 DIAGNOSIS — J860 Pyothorax with fistula: Secondary | ICD-10-CM | POA: Diagnosis not present

## 2017-04-08 DIAGNOSIS — Z87891 Personal history of nicotine dependence: Secondary | ICD-10-CM | POA: Diagnosis not present

## 2017-04-08 DIAGNOSIS — Z79899 Other long term (current) drug therapy: Secondary | ICD-10-CM | POA: Diagnosis not present

## 2017-04-08 DIAGNOSIS — R490 Dysphonia: Secondary | ICD-10-CM | POA: Diagnosis not present

## 2017-04-08 DIAGNOSIS — K219 Gastro-esophageal reflux disease without esophagitis: Secondary | ICD-10-CM | POA: Diagnosis not present

## 2017-04-08 DIAGNOSIS — R05 Cough: Secondary | ICD-10-CM | POA: Diagnosis not present

## 2017-04-10 ENCOUNTER — Ambulatory Visit (HOSPITAL_BASED_OUTPATIENT_CLINIC_OR_DEPARTMENT_OTHER): Payer: Medicare Other | Admitting: Adult Health

## 2017-04-10 ENCOUNTER — Ambulatory Visit (HOSPITAL_BASED_OUTPATIENT_CLINIC_OR_DEPARTMENT_OTHER): Payer: Medicare Other

## 2017-04-10 ENCOUNTER — Ambulatory Visit: Payer: Medicare Other | Admitting: Nutrition

## 2017-04-10 ENCOUNTER — Other Ambulatory Visit (HOSPITAL_BASED_OUTPATIENT_CLINIC_OR_DEPARTMENT_OTHER): Payer: Medicare Other

## 2017-04-10 ENCOUNTER — Ambulatory Visit: Payer: Medicare Other

## 2017-04-10 ENCOUNTER — Encounter: Payer: Self-pay | Admitting: Adult Health

## 2017-04-10 ENCOUNTER — Other Ambulatory Visit: Payer: Self-pay

## 2017-04-10 VITALS — BP 145/61 | HR 95 | Temp 98.9°F | Resp 18 | Ht 62.0 in | Wt 202.3 lb

## 2017-04-10 DIAGNOSIS — Z5112 Encounter for antineoplastic immunotherapy: Secondary | ICD-10-CM | POA: Diagnosis not present

## 2017-04-10 DIAGNOSIS — Z95828 Presence of other vascular implants and grafts: Secondary | ICD-10-CM

## 2017-04-10 DIAGNOSIS — C8331 Diffuse large B-cell lymphoma, lymph nodes of head, face, and neck: Secondary | ICD-10-CM | POA: Diagnosis not present

## 2017-04-10 DIAGNOSIS — T80212S Local infection due to central venous catheter, sequela: Secondary | ICD-10-CM

## 2017-04-10 DIAGNOSIS — Z853 Personal history of malignant neoplasm of breast: Secondary | ICD-10-CM | POA: Diagnosis not present

## 2017-04-10 DIAGNOSIS — C50411 Malignant neoplasm of upper-outer quadrant of right female breast: Secondary | ICD-10-CM

## 2017-04-10 DIAGNOSIS — Z170 Estrogen receptor positive status [ER+]: Principal | ICD-10-CM

## 2017-04-10 DIAGNOSIS — Z5111 Encounter for antineoplastic chemotherapy: Secondary | ICD-10-CM

## 2017-04-10 LAB — CBC WITH DIFFERENTIAL/PLATELET
BASO%: 1 % (ref 0.0–2.0)
Basophils Absolute: 0.1 10*3/uL (ref 0.0–0.1)
EOS ABS: 0.3 10*3/uL (ref 0.0–0.5)
EOS%: 4.3 % (ref 0.0–7.0)
HCT: 26.7 % — ABNORMAL LOW (ref 34.8–46.6)
HEMOGLOBIN: 8.6 g/dL — AB (ref 11.6–15.9)
LYMPH%: 8.8 % — ABNORMAL LOW (ref 14.0–49.7)
MCH: 29.5 pg (ref 25.1–34.0)
MCHC: 32.3 g/dL (ref 31.5–36.0)
MCV: 91.2 fL (ref 79.5–101.0)
MONO#: 1.2 10*3/uL — AB (ref 0.1–0.9)
MONO%: 14.6 % — AB (ref 0.0–14.0)
NEUT%: 71.3 % (ref 38.4–76.8)
NEUTROS ABS: 5.8 10*3/uL (ref 1.5–6.5)
Platelets: 390 10*3/uL (ref 145–400)
RBC: 2.93 10*6/uL — ABNORMAL LOW (ref 3.70–5.45)
RDW: 20.2 % — AB (ref 11.2–14.5)
WBC: 8.2 10*3/uL (ref 3.9–10.3)
lymph#: 0.7 10*3/uL — ABNORMAL LOW (ref 0.9–3.3)

## 2017-04-10 LAB — COMPREHENSIVE METABOLIC PANEL
ALT: 52 U/L (ref 0–55)
ANION GAP: 9 meq/L (ref 3–11)
AST: 35 U/L — ABNORMAL HIGH (ref 5–34)
Albumin: 2.6 g/dL — ABNORMAL LOW (ref 3.5–5.0)
Alkaline Phosphatase: 46 U/L (ref 40–150)
BUN: 15.9 mg/dL (ref 7.0–26.0)
CALCIUM: 8.4 mg/dL (ref 8.4–10.4)
CHLORIDE: 103 meq/L (ref 98–109)
CO2: 25 mEq/L (ref 22–29)
CREATININE: 0.8 mg/dL (ref 0.6–1.1)
EGFR: >60 mL/min/{1.73_m2} (ref >60)
Glucose: 126 mg/dl (ref 70–140)
POTASSIUM: 3.8 meq/L (ref 3.5–5.1)
Sodium: 137 mEq/L (ref 136–145)
Total Bilirubin: 0.44 mg/dL (ref 0.20–1.20)
Total Protein: 5.8 g/dL — ABNORMAL LOW (ref 6.4–8.3)

## 2017-04-10 MED ORDER — DIPHENHYDRAMINE HCL 12.5 MG/5ML PO ELIX
50.0000 mg | ORAL_SOLUTION | Freq: Once | ORAL | Status: DC
  Filled 2017-04-10: qty 20

## 2017-04-10 MED ORDER — DOXORUBICIN HCL CHEMO IV INJECTION 2 MG/ML
30.0000 mg/m2 | Freq: Once | INTRAVENOUS | Status: AC
  Administered 2017-04-10: 66 mg via INTRAVENOUS
  Filled 2017-04-10: qty 33

## 2017-04-10 MED ORDER — DIPHENHYDRAMINE HCL 50 MG/ML IJ SOLN
INTRAMUSCULAR | Status: AC
  Filled 2017-04-10: qty 1

## 2017-04-10 MED ORDER — PALONOSETRON HCL INJECTION 0.25 MG/5ML
INTRAVENOUS | Status: AC
  Filled 2017-04-10: qty 5

## 2017-04-10 MED ORDER — ACETAMINOPHEN 325 MG PO TABS
ORAL_TABLET | ORAL | Status: AC
  Filled 2017-04-10: qty 2

## 2017-04-10 MED ORDER — SODIUM CHLORIDE 0.9 % IV SOLN
400.0000 mg/m2 | Freq: Once | INTRAVENOUS | Status: AC
  Administered 2017-04-10: 860 mg via INTRAVENOUS
  Filled 2017-04-10: qty 43

## 2017-04-10 MED ORDER — PALONOSETRON HCL INJECTION 0.25 MG/5ML
0.2500 mg | Freq: Once | INTRAVENOUS | Status: AC
  Administered 2017-04-10: 0.25 mg via INTRAVENOUS

## 2017-04-10 MED ORDER — FUROSEMIDE 20 MG PO TABS: 20.0000 mg | ORAL_TABLET | Freq: Two times a day (BID) | ORAL | 1 refills | Status: DC

## 2017-04-10 MED ORDER — DIPHENHYDRAMINE HCL 50 MG/ML IJ SOLN: 50.0000 mg | Freq: Once | INTRAMUSCULAR | Status: DC

## 2017-04-10 MED ORDER — SODIUM CHLORIDE 0.9% FLUSH
10.0000 mL | INTRAVENOUS | Status: DC | PRN
  Administered 2017-04-10: 10 mL
  Filled 2017-04-10: qty 10

## 2017-04-10 MED ORDER — DEXAMETHASONE SODIUM PHOSPHATE 10 MG/ML IJ SOLN
INTRAMUSCULAR | Status: AC
  Filled 2017-04-10: qty 1

## 2017-04-10 MED ORDER — ACETAMINOPHEN 160 MG/5ML PO SOLN
650.0000 mg | Freq: Once | ORAL | Status: AC
  Administered 2017-04-10: 650 mg via ORAL
  Filled 2017-04-10: qty 20.3

## 2017-04-10 MED ORDER — PEGFILGRASTIM 6 MG/0.6ML ~~LOC~~ PSKT
PREFILLED_SYRINGE | SUBCUTANEOUS | Status: AC
  Filled 2017-04-10: qty 0.6

## 2017-04-10 MED ORDER — SODIUM CHLORIDE 0.9 % IV SOLN
1.0000 mg | Freq: Once | INTRAVENOUS | Status: AC
  Administered 2017-04-10: 1 mg via INTRAVENOUS
  Filled 2017-04-10: qty 1

## 2017-04-10 MED ORDER — SODIUM CHLORIDE 0.9% FLUSH
10.0000 mL | Freq: Once | INTRAVENOUS | Status: AC
  Administered 2017-04-10: 10 mL
  Filled 2017-04-10: qty 10

## 2017-04-10 MED ORDER — DEXAMETHASONE SODIUM PHOSPHATE 10 MG/ML IJ SOLN
10.0000 mg | Freq: Once | INTRAMUSCULAR | Status: AC
  Administered 2017-04-10: 10 mg via INTRAVENOUS

## 2017-04-10 MED ORDER — HEPARIN SOD (PORK) LOCK FLUSH 100 UNIT/ML IV SOLN
500.0000 [IU] | Freq: Once | INTRAVENOUS | Status: AC | PRN
Start: 2017-04-10 — End: 2017-04-10
  Administered 2017-04-10: 500 [IU]
  Filled 2017-04-10: qty 5

## 2017-04-10 MED ORDER — PEGFILGRASTIM 6 MG/0.6ML ~~LOC~~ PSKT
6.0000 mg | PREFILLED_SYRINGE | Freq: Once | SUBCUTANEOUS | Status: AC
  Administered 2017-04-10: 6 mg via SUBCUTANEOUS

## 2017-04-10 MED ORDER — SODIUM CHLORIDE 0.9 % IV SOLN
Freq: Once | INTRAVENOUS | Status: AC
  Administered 2017-04-10: 14:00:00 via INTRAVENOUS

## 2017-04-10 MED ORDER — SODIUM CHLORIDE 0.9 % IV SOLN
375.0000 mg/m2 | Freq: Once | INTRAVENOUS | Status: AC
  Administered 2017-04-10: 800 mg via INTRAVENOUS
  Filled 2017-04-10: qty 50

## 2017-04-10 MED ORDER — DIPHENHYDRAMINE HCL 50 MG/ML IJ SOLN
12.5000 mg | Freq: Once | INTRAMUSCULAR | Status: AC
  Administered 2017-04-10: 12.5 mg via INTRAVENOUS

## 2017-04-10 NOTE — Assessment & Plan Note (Signed)
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 positive for LCA, CD20, CD79a PAX-5, BCL-6 and weak cytoplasmic kappa associated with patchy weak positivity for BCL-2 and CD10, Ki-67 40%  Stage IIb (bilateral cervical, esophageal, mediastinal, supraclavicular nodes)  Bone marrow biopsy negative for diffuse large B cell lymphoma but suggestive of small low-grade B cell population possibly SLL/CLL  Revised IPI score: 1, good prognosis, redictated for years PFS 80%, predicted overall survival 79% ------------------------------------------------------------------------ Current treatment: R CHOP cycle 5 day 1  Kristen Escobar is doing well today.  She will proceed with chemotherapy today.  This is the final treatment per Dr. Geralyn Flash last note.  She will continue to f/u with Dr. Nicolette Bang about her esophageal fistula.  I will have her f/u with Dr. Lindi Adie in early January to discuss future plans.

## 2017-04-10 NOTE — Progress Notes (Signed)
[facility] Follow up:    Kristen Lor, MD [address] [place] [address]   DIAGNOSIS: Cancer Staging Breast cancer of upper-outer quadrant of right female breast [facility]) Staging form: Breast, AJCC 7th Edition - Clinical: No stage assigned - Unsigned - Pathologic: Stage IA (T1a, N0, cM0) - Signed by Stark Rutkowski, MD on 03/21/2011   SUMMARY OF ONCOLOGIC HISTORY:   Breast cancer of upper-outer quadrant of right female breast ([place])   04/22/2009 - 03/27/2010 Anti-estrogen oral therapy    Neoadjuvant antiestrogen therapy with tamoxifen for 10 months      03/28/2010 Surgery    Right breast lumpectomy invasive ductal-lobular carcinoma intermediate grade 0.5 cm, 4 SLN negative, reexcision margins clear: ER 95% PR 95% HER-2 negative Ki-67 8%      06/03/2010 - 07/12/2010 Radiation Therapy    Radiation therapy to lumpectomy site ? Radiation recall related to tamoxifen      11/25/2010 -  Anti-estrogen oral therapy    Aromasin 25 mg daily.myalgias and arthralgias stop November 2012 went back to tamoxifen 20 mg daily      01/06/2017 Pathology Results    Bone marrow biopsy: Slightly hypercellular bone marrow for age with granulocytic hyperplasia, numerous small lymphoid aggregates present. 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 positive for LCA, CD20, CD79a PAX-5, BCL-6 and weak cytoplasmic kappa associated with patchy weak positivity for BCL-2 and CD10, Ki-67 40%      01/16/2017 -  Chemotherapy    R CHOP 6 cycles       CURRENT THERAPY: R-CHOP cycle 5 day 1  INTERVAL HISTORY: Kristen Escobar 71 y.o. female returns for evaluation prior to receiving chemotherapy today.  She is doing well today.  She was having issues  with swelling, and Dr. Lindi Adie increased her lasix from daily to BID.  Her swelling has improved.  She has been meeting with Dr. Nicolette Bang to discuss surgery for her esophageal fistula.  Her husband has continued to take very good care of her, she is tolerated her tube feedings and medications via tube without any difficulty.     Patient Active Problem List   Diagnosis Date Noted  . Breast cancer of upper-outer quadrant of right female breast ([place]) 03/21/2011    Priority: High  . Pressure injury of skin 03/13/2017  . Intracranial hemorrhage ([place]) 03/12/2017  . SDH (subdural hematoma) ([place]) 03/11/2017  . Uses feeding tube 03/11/2017  . Hyperglycemia 03/11/2017  . Left maxillary sinusitis 03/11/2017  . 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 Pulmonary embolus ([place]) 02/22/2017  . Port or reservoir infection 01/20/2017  . Port-A-Cath in place 01/20/2017  . Diffuse large B cell lymphoma ([place]) 01/01/2017  . Neck mass 12/26/2016  . Esophageal mass 11/27/2016  . Hypothyroidism 03/25/2016  . Bilateral carotid bruits 08/22/2015  . Impaired glucose tolerance 08/22/2015  . Chronic kidney disease 08/22/2015  . Benign paroxysmal positional vertigo 02/05/2013  . S/P left THA, AA 01/20/2012  . Osteoarthritis 09/24/2011  . Morbid obesity ([place]) 08/02/2009  . Essential hypertension 08/02/2009    has No Known Allergies.  MEDICAL HISTORY: Past Medical History:  Diagnosis Date  . Acute respiratory failure with hypoxia ([place]) 02/16/2017  . Arthritis   . Cancer [facility]) 03/2009  breast- rt  . Diffuse large B cell lymphoma ([place]) dx'd 02/17/17  . GERD (gastroesophageal reflux disease)   . History of radiation therapy 07/12/10,completed   right breast 60 Gy x30 fx  . Hypertension   . Hypothyroidism   . Obesity   . Peripheral vascular disease ([place]) 1995   PT DEVELOPED CIRCULATION PROBLEMS IN BOTH HANDS AND GANGRENE OF BOTH INDEX FINGERS--REQUIRING AMPUTATION OF THE INDEX FINGERS AND VASCULAR  SURGERY.  PT TOLD HER PROBLEMS RELATED TO SMOKING.   NO OTHER PROBLEMS SINCE.  Marland Kitchen Pneumonia   . Thyroid mass     SURGICAL HISTORY: Past Surgical History:  Procedure Laterality Date  . AMPUTATION     partial amputation of both index fingers  . BREAST SURGERY  2011   lumpectomy with node sampling- RIGHT  . COLONOSCOPY    . ESOPHAGOGASTRODUODENOSCOPY    . EXCISION MASS NECK Left 12/26/2016   Procedure: EXCISION MASS NECK;  Surgeon: Izora Gala, MD;  Location: [facility];  Service: ENT;  Laterality: Left;  open excision of thyroid mass left side with frozen section  . HAND SURGERY Bilateral 1995   Amputaed pointer fingers bilaterally  . IR FLUORO GUIDE PORT INSERTION RIGHT  01/14/2017  . IR GASTROSTOMY TUBE MOD SED  02/19/2017  . IR REPLC GASTRO/COLONIC TUBE PERCUT W/FLUORO  02/25/2017  . IR [place] TUBE PERCUT W/FLUORO  03/09/2017  . IR US GUIDE VASC ACCESS RIGHT  01/14/2017  . TOTAL HIP ARTHROPLASTY  12/16/2011   right hip  . TOTAL HIP ARTHROPLASTY  01/20/2012   Procedure: TOTAL HIP ARTHROPLASTY ANTERIOR APPROACH;  Surgeon: Mauri Pole, MD;  Location: [facility];  Service: Orthopedics;  Laterality: Left;  . TUBAL LIGATION    . VASCULAR SURGERY     both hands    SOCIAL HISTORY: Social History   Socioeconomic History  . Marital status: Married    Spouse name: Not on file  . Number of children: Not on file  . Years of education: Not on file  . Highest education level: Not on file  Social Needs  . Financial resource strain: Not on file  . Food insecurity - worry: Not on file  . Food insecurity - inability: Not on file  . Transportation needs - medical: Not on file  . Transportation needs - non-medical: Not on file  Occupational History  . Not on file  Tobacco Use  . Smoking status: Former Smoker    Packs/day: 1.00    Years: 20.00    Pack years: 20.00    Last attempt to quit: 04/21/1993    Years since quitting: 23.9  . Smokeless tobacco: Never Used  Substance and Sexual  Activity  . Alcohol use: Yes    Comment: OCCAS - MAYBE ONCE A MONTH  . Drug use: No  . Sexual activity: Yes  Other Topics Concern  . Not on file  Social History Narrative  . Not on file    FAMILY HISTORY: Family History  Problem Relation Age of Onset  . Cancer Mother        pt unaware of what kind  . Hearing loss Mother   . Coronary artery disease Father   . Diabetes Father   . Coronary artery disease Brother   . Coronary artery disease Brother   . Diabetes Sister   . Cancer Sister        breast  . Colon cancer Neg Hx   . Stomach cancer Neg Hx   . Rectal cancer  Neg Hx   . Esophageal cancer Neg Hx     Review of Systems  Constitutional: Negative for appetite change, chills, fatigue, fever and unexpected weight change.  HENT:   Negative for hearing loss.   Eyes: Negative for eye problems and icterus.  Respiratory: Negative for chest tightness, cough and shortness of breath.   Cardiovascular: Negative for chest pain, leg swelling and palpitations.  Gastrointestinal: Negative for abdominal distention, abdominal pain, constipation, diarrhea, nausea and vomiting.  Endocrine: Negative for hot flashes.  Genitourinary: Negative for difficulty urinating.   Musculoskeletal: Negative for arthralgias.  Skin: Negative for itching and rash.  Neurological: Negative for dizziness and numbness.  Hematological: Negative for adenopathy. Does not bruise/bleed easily.  Psychiatric/Behavioral: Negative for depression. The patient is not nervous/anxious.       PHYSICAL EXAMINATION  ECOG PERFORMANCE STATUS: 2 - Symptomatic, <50% confined to bed  Vitals:   04/10/17 1243  BP: (!) 145/61  Pulse: 95  Resp: 18  Temp: 98.9 F (37.2 C)  SpO2: 94%    Physical Exam  Constitutional: She is oriented to person, place, and time and well-developed, well-nourished, and in no distress.  HENT:  Head: Normocephalic and atraumatic.  Mouth/Throat: Oropharynx is clear and moist. No oropharyngeal  exudate.  Eyes: Pupils are equal, round, and reactive to light. No scleral icterus.  Neck: Neck supple.  Cardiovascular: Normal rate, regular rhythm, normal heart sounds and intact distal pulses.  Pulmonary/Chest: Effort normal and breath sounds normal.  Abdominal: Soft. Bowel sounds are normal. She exhibits no distension. There is no tenderness. There is no rebound and no guarding.  Musculoskeletal: She exhibits no edema.  Lymphadenopathy:    She has no cervical adenopathy.  Neurological: She is alert and oriented to person, place, and time.  Skin: Skin is warm and dry. No rash noted.  Psychiatric: Mood and affect normal.    LABORATORY DATA:  CBC    Component Value Date/Time   WBC 8.2 04/10/2017 1134   WBC 8.9 03/13/2017 0926   RBC 2.93 (L) 04/10/2017 1134   RBC 2.79 (L) 03/13/2017 0926   HGB 8.6 (L) 04/10/2017 1134   HCT 26.7 (L) 04/10/2017 1134   PLT 390 04/10/2017 1134   MCV 91.2 04/10/2017 1134   MCH 29.5 04/10/2017 1134   MCH 28.7 03/13/2017 0926   MCHC 32.3 04/10/2017 1134   MCHC 32.1 03/13/2017 0926   RDW 20.2 (H) 04/10/2017 1134   LYMPHSABS 0.7 (L) 04/10/2017 1134   MONOABS 1.2 (H) 04/10/2017 1134   EOSABS 0.3 04/10/2017 1134   EOSABS 0.4 05/02/2009 1517   BASOSABS 0.1 04/10/2017 1134    CMP     Component Value Date/Time   NA 137 04/10/2017 1134   K 3.8 04/10/2017 1134   CL 111 03/13/2017 0926   CL 105 03/10/2012 0859   CO2 25 04/10/2017 1134   GLUCOSE 126 04/10/2017 1134   GLUCOSE 149 (H) 03/10/2012 0859   BUN 15.9 04/10/2017 1134   CREATININE 0.8 04/10/2017 1134   CALCIUM 8.4 04/10/2017 1134   PROT 5.8 (L) 04/10/2017 1134   ALBUMIN 2.6 (L) 04/10/2017 1134   AST 35 (H) 04/10/2017 1134   ALT 52 04/10/2017 1134   ALKPHOS 46 04/10/2017 1134   BILITOT 0.44 04/10/2017 1134   GFRNONAA >60 03/13/2017 0926   GFRAA >60 03/13/2017 0926       PENDING LABS:   RADIOGRAPHIC STUDIES:  No results found.   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 positive for LCA, CD20, CD79a PAX-5, BCL-6 and weak cytoplasmic kappa associated with patchy weak positivity for BCL-2 and CD10, Ki-67 40%  Stage IIb (bilateral cervical, esophageal, mediastinal, supraclavicular nodes)  Bone marrow biopsy negative for diffuse large B cell lymphoma but suggestive of small low-grade B cell population possibly SLL/CLL  Revised IPI score: 1, good prognosis, redictated for years PFS 80%, predicted overall survival 79% ------------------------------------------------------------------------ Current treatment: R CHOP cycle 5 day 1  Kristen Escobar is doing well today.  She will proceed with chemotherapy today.  This is the final treatment per Dr. Geralyn Flash last note.  She will continue to f/u with Dr. Nicolette Bang about her esophageal fistula.  I will have her f/u with Dr. Lindi Adie in early January to discuss future plans.     All questions were answered. The patient knows to call the clinic with any problems, questions or concerns. We can certainly see the patient much sooner if necessary.  A total of (30) minutes of face-to-face time was spent with this patient with greater than 50% of that time in counseling and care-coordination.  This note was electronically signed. Scot Dock, NP 04/10/2017

## 2017-04-10 NOTE — Progress Notes (Signed)
Nutrition follow-up completed with patient and husband, during treatment for B-cell lymphoma. Weight documented as 202.3 pounds December 21. Labs reviewed noted albumin 2.6. Patient denies nutrition impact symptoms. Reports her stage II ulcer is improved and healing. Patient tolerating Osmolite 1.5 via PEG at 60 mL an hour.   This provides 2130 cal, 89.4 g protein, and 1086 mL free water. Patient continues to receive 30 mL of free water flush every hour while on tube feeding.  Estimated nutrition needs: 2000-2200 cal, 110-120 grams protein, 2.2 L fluid.  Nutrition diagnosis: Unintended weight loss cannot be evaluated secondary to edema. Severe malnutrition, improved.  Intervention: Patient educated to continue tube feedings of Osmolite 1.5 at 60 mL an hour with 30 cc free water flush every hour. Will monitor healing and add protein as needed. Questions answered.  Teach back method used.  Monitoring, evaluation, goals: Patient has increase tube feeding to goal rate and stage II ulcer is healing.  Next visit: Patient will call me with any questions or concerns.  **Disclaimer: This note was dictated with voice recognition software. Similar sounding words can inadvertently be transcribed and this note may contain transcription errors which may not have been corrected upon publication of note.**

## 2017-04-10 NOTE — Patient Instructions (Signed)
[facility] Discharge Instructions for Patients Receiving Chemotherapy  Today you received the following chemotherapy agents:  Adriamycin, Vincristine, Cytoxan, and Rituxan.  To help prevent nausea and vomiting after your treatment, we encourage you to take your nausea medication as directed.   If you develop nausea and vomiting that is not controlled by your nausea medication, call the clinic.   BELOW ARE SYMPTOMS THAT SHOULD BE REPORTED IMMEDIATELY:  *FEVER GREATER THAN 100.5 F  *CHILLS WITH OR WITHOUT FEVER  NAUSEA AND VOMITING THAT IS NOT CONTROLLED WITH YOUR NAUSEA MEDICATION  *UNUSUAL SHORTNESS OF BREATH  *UNUSUAL BRUISING OR BLEEDING  TENDERNESS IN MOUTH AND THROAT WITH OR WITHOUT PRESENCE OF ULCERS  *URINARY PROBLEMS  *BOWEL PROBLEMS  UNUSUAL RASH Items with * indicate a potential emergency and should be followed up as soon as possible.  Feel free to call the clinic should you have any questions or concerns. The clinic phone number is [phone].  Please show the CHEMO ALERT CARD at check-in to the Emergency Department and triage nurse.   

## 2017-04-13 ENCOUNTER — Telehealth: Payer: Self-pay | Admitting: Hematology and Oncology

## 2017-04-13 ENCOUNTER — Ambulatory Visit: Payer: Medicare Other | Admitting: Adult Health

## 2017-04-13 NOTE — Telephone Encounter (Signed)
Spoke to patient regarding upcoming January 2019 appointments.  °

## 2017-04-17 DIAGNOSIS — J860 Pyothorax with fistula: Secondary | ICD-10-CM | POA: Diagnosis not present

## 2017-04-17 DIAGNOSIS — I129 Hypertensive chronic kidney disease with stage 1 through stage 4 chronic kidney disease, or unspecified chronic kidney disease: Secondary | ICD-10-CM | POA: Diagnosis not present

## 2017-04-17 DIAGNOSIS — N183 Chronic kidney disease, stage 3 (moderate): Secondary | ICD-10-CM | POA: Diagnosis not present

## 2017-04-17 DIAGNOSIS — C8330 Diffuse large B-cell lymphoma, unspecified site: Secondary | ICD-10-CM | POA: Diagnosis not present

## 2017-04-17 DIAGNOSIS — D631 Anemia in chronic kidney disease: Secondary | ICD-10-CM | POA: Diagnosis not present

## 2017-04-17 DIAGNOSIS — Z431 Encounter for attention to gastrostomy: Secondary | ICD-10-CM | POA: Diagnosis not present

## 2017-04-23 DIAGNOSIS — N183 Chronic kidney disease, stage 3 (moderate): Secondary | ICD-10-CM | POA: Diagnosis not present

## 2017-04-23 DIAGNOSIS — J860 Pyothorax with fistula: Secondary | ICD-10-CM | POA: Diagnosis not present

## 2017-04-23 DIAGNOSIS — C8330 Diffuse large B-cell lymphoma, unspecified site: Secondary | ICD-10-CM | POA: Diagnosis not present

## 2017-04-23 DIAGNOSIS — I129 Hypertensive chronic kidney disease with stage 1 through stage 4 chronic kidney disease, or unspecified chronic kidney disease: Secondary | ICD-10-CM | POA: Diagnosis not present

## 2017-04-23 DIAGNOSIS — D631 Anemia in chronic kidney disease: Secondary | ICD-10-CM | POA: Diagnosis not present

## 2017-04-23 DIAGNOSIS — Z431 Encounter for attention to gastrostomy: Secondary | ICD-10-CM | POA: Diagnosis not present

## 2017-04-24 ENCOUNTER — Other Ambulatory Visit: Payer: Self-pay

## 2017-04-24 DIAGNOSIS — C8331 Diffuse large B-cell lymphoma, lymph nodes of head, face, and neck: Secondary | ICD-10-CM

## 2017-04-24 MED ORDER — ALLOPURINOL 300 MG PO TABS: 300.0000 mg | ORAL_TABLET | Freq: Every day | ORAL | Status: DC

## 2017-04-27 DIAGNOSIS — E669 Obesity, unspecified: Secondary | ICD-10-CM | POA: Diagnosis not present

## 2017-04-27 DIAGNOSIS — I129 Hypertensive chronic kidney disease with stage 1 through stage 4 chronic kidney disease, or unspecified chronic kidney disease: Secondary | ICD-10-CM | POA: Diagnosis not present

## 2017-04-27 DIAGNOSIS — K219 Gastro-esophageal reflux disease without esophagitis: Secondary | ICD-10-CM | POA: Diagnosis not present

## 2017-04-27 DIAGNOSIS — Z853 Personal history of malignant neoplasm of breast: Secondary | ICD-10-CM | POA: Diagnosis not present

## 2017-04-27 DIAGNOSIS — Z89022 Acquired absence of left finger(s): Secondary | ICD-10-CM | POA: Diagnosis not present

## 2017-04-27 DIAGNOSIS — C8330 Diffuse large B-cell lymphoma, unspecified site: Secondary | ICD-10-CM | POA: Diagnosis not present

## 2017-04-27 DIAGNOSIS — Z7901 Long term (current) use of anticoagulants: Secondary | ICD-10-CM | POA: Diagnosis not present

## 2017-04-27 DIAGNOSIS — D696 Thrombocytopenia, unspecified: Secondary | ICD-10-CM | POA: Diagnosis not present

## 2017-04-27 DIAGNOSIS — J860 Pyothorax with fistula: Secondary | ICD-10-CM | POA: Diagnosis not present

## 2017-04-27 DIAGNOSIS — S065X0D Traumatic subdural hemorrhage without loss of consciousness, subsequent encounter: Secondary | ICD-10-CM | POA: Diagnosis not present

## 2017-04-27 DIAGNOSIS — J320 Chronic maxillary sinusitis: Secondary | ICD-10-CM | POA: Diagnosis not present

## 2017-04-27 DIAGNOSIS — I739 Peripheral vascular disease, unspecified: Secondary | ICD-10-CM | POA: Diagnosis not present

## 2017-04-27 DIAGNOSIS — Z89021 Acquired absence of right finger(s): Secondary | ICD-10-CM | POA: Diagnosis not present

## 2017-04-27 DIAGNOSIS — W101XXD Fall (on)(from) sidewalk curb, subsequent encounter: Secondary | ICD-10-CM | POA: Diagnosis not present

## 2017-04-27 DIAGNOSIS — L89312 Pressure ulcer of right buttock, stage 2: Secondary | ICD-10-CM | POA: Diagnosis not present

## 2017-04-27 DIAGNOSIS — M1990 Unspecified osteoarthritis, unspecified site: Secondary | ICD-10-CM | POA: Diagnosis not present

## 2017-04-27 DIAGNOSIS — Z86711 Personal history of pulmonary embolism: Secondary | ICD-10-CM | POA: Diagnosis not present

## 2017-04-27 DIAGNOSIS — D631 Anemia in chronic kidney disease: Secondary | ICD-10-CM | POA: Diagnosis not present

## 2017-04-27 DIAGNOSIS — Z431 Encounter for attention to gastrostomy: Secondary | ICD-10-CM | POA: Diagnosis not present

## 2017-04-27 DIAGNOSIS — E039 Hypothyroidism, unspecified: Secondary | ICD-10-CM | POA: Diagnosis not present

## 2017-04-27 DIAGNOSIS — Z87891 Personal history of nicotine dependence: Secondary | ICD-10-CM | POA: Diagnosis not present

## 2017-04-27 DIAGNOSIS — N183 Chronic kidney disease, stage 3 (moderate): Secondary | ICD-10-CM | POA: Diagnosis not present

## 2017-04-30 DIAGNOSIS — C8330 Diffuse large B-cell lymphoma, unspecified site: Secondary | ICD-10-CM | POA: Diagnosis not present

## 2017-04-30 DIAGNOSIS — L89312 Pressure ulcer of right buttock, stage 2: Secondary | ICD-10-CM | POA: Diagnosis not present

## 2017-04-30 DIAGNOSIS — Z431 Encounter for attention to gastrostomy: Secondary | ICD-10-CM | POA: Diagnosis not present

## 2017-04-30 DIAGNOSIS — S065X0D Traumatic subdural hemorrhage without loss of consciousness, subsequent encounter: Secondary | ICD-10-CM | POA: Diagnosis not present

## 2017-04-30 DIAGNOSIS — I129 Hypertensive chronic kidney disease with stage 1 through stage 4 chronic kidney disease, or unspecified chronic kidney disease: Secondary | ICD-10-CM | POA: Diagnosis not present

## 2017-04-30 DIAGNOSIS — N183 Chronic kidney disease, stage 3 (moderate): Secondary | ICD-10-CM | POA: Diagnosis not present

## 2017-04-30 NOTE — Assessment & Plan Note (Signed)
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 positive for LCA, CD20, CD79a PAX-5, BCL-6 and weak cytoplasmic kappa associated with patchy weak positivity for BCL-2 and CD10, Ki-67 40%  Stage IIb (bilateral cervical, esophageal, mediastinal, supraclavicular nodes)  Bone marrow biopsy negative for diffuse large B cell lymphoma but suggestive of small low-grade B cell population possibly SLL/CLL  Revised IPI score: 1, good prognosis, redictated for years PFS 80%, predicted overall survival 79% ------------------------------------------------------------------------ Current treatment: R CHOP cycle 6 day 1  TE Fistula: Seeing ENT RTC after PET CT in 1 month

## 2017-05-01 ENCOUNTER — Telehealth: Payer: Self-pay | Admitting: Hematology and Oncology

## 2017-05-01 ENCOUNTER — Inpatient Hospital Stay: Payer: Medicare Other | Attending: Hematology and Oncology

## 2017-05-01 ENCOUNTER — Inpatient Hospital Stay (HOSPITAL_BASED_OUTPATIENT_CLINIC_OR_DEPARTMENT_OTHER): Payer: Medicare Other | Admitting: Hematology and Oncology

## 2017-05-01 ENCOUNTER — Other Ambulatory Visit: Payer: Self-pay | Admitting: *Deleted

## 2017-05-01 DIAGNOSIS — Z853 Personal history of malignant neoplasm of breast: Secondary | ICD-10-CM | POA: Diagnosis not present

## 2017-05-01 DIAGNOSIS — R05 Cough: Secondary | ICD-10-CM | POA: Insufficient documentation

## 2017-05-01 DIAGNOSIS — I2699 Other pulmonary embolism without acute cor pulmonale: Secondary | ICD-10-CM

## 2017-05-01 DIAGNOSIS — Z7901 Long term (current) use of anticoagulants: Secondary | ICD-10-CM | POA: Diagnosis not present

## 2017-05-01 DIAGNOSIS — C8331 Diffuse large B-cell lymphoma, lymph nodes of head, face, and neck: Secondary | ICD-10-CM | POA: Insufficient documentation

## 2017-05-01 LAB — CBC WITH DIFFERENTIAL/PLATELET
BASOS ABS: 0.1 10*3/uL (ref 0.0–0.1)
BASOS PCT: 1 %
EOS ABS: 0.3 10*3/uL (ref 0.0–0.5)
EOS PCT: 4 %
HEMATOCRIT: 33.5 % — AB (ref 34.8–46.6)
Hemoglobin: 10.9 g/dL — ABNORMAL LOW (ref 11.6–15.9)
Lymphocytes Relative: 9 %
Lymphs Abs: 0.7 10*3/uL — ABNORMAL LOW (ref 0.9–3.3)
MCH: 30 pg (ref 25.1–34.0)
MCHC: 32.4 g/dL (ref 31.5–36.0)
MCV: 92.4 fL (ref 79.5–101.0)
MONO ABS: 1.2 10*3/uL — AB (ref 0.1–0.9)
Monocytes Relative: 16 %
NEUTROS ABS: 5.4 10*3/uL (ref 1.5–6.5)
Neutrophils Relative %: 70 %
Platelets: 359 10*3/uL (ref 145–400)
RBC: 3.62 MIL/uL — ABNORMAL LOW (ref 3.70–5.45)
RDW: 20 % — ABNORMAL HIGH (ref 11.2–16.1)
WBC: 7.7 10*3/uL (ref 3.9–10.3)

## 2017-05-01 LAB — COMPREHENSIVE METABOLIC PANEL
ALBUMIN: 3 g/dL — AB (ref 3.5–5.0)
ALK PHOS: 59 U/L (ref 40–150)
ALT: 35 U/L (ref 0–55)
ANION GAP: 10 (ref 3–11)
AST: 24 U/L (ref 5–34)
BILIRUBIN TOTAL: 0.5 mg/dL (ref 0.2–1.2)
BUN: 19 mg/dL (ref 7–26)
CALCIUM: 9.3 mg/dL (ref 8.4–10.4)
CO2: 27 mmol/L (ref 22–29)
CREATININE: 0.85 mg/dL (ref 0.60–1.10)
Chloride: 104 mmol/L (ref 98–109)
GFR calc Af Amer: >60 mL/min (ref >60)
GFR calc non Af Amer: >60 mL/min (ref >60)
GLUCOSE: 103 mg/dL (ref 70–140)
Potassium: 4.5 mmol/L (ref 3.3–4.7)
Sodium: 141 mmol/L (ref 136–145)
TOTAL PROTEIN: 6.5 g/dL (ref 6.4–8.3)

## 2017-05-01 MED ORDER — ALLOPURINOL 300 MG PO TABS: 300.0000 mg | ORAL_TABLET | Freq: Every day | ORAL | 1 refills | Status: DC

## 2017-05-01 MED ORDER — ENOXAPARIN SODIUM 80 MG/0.8ML ~~LOC~~ SOLN: 80.0000 mg | Freq: Two times a day (BID) | SUBCUTANEOUS | 60 refills | Status: DC

## 2017-05-01 NOTE — Telephone Encounter (Signed)
Gave patient AVs and calendar of upcoming February appointments.  °

## 2017-05-01 NOTE — Progress Notes (Signed)
Patient Care Team: Marletta Lor, MD as PCP - General Marcy Panning, MD as Consulting Physician (Internal Medicine) Thea Silversmith, MD (Inactive) as Consulting Physician (Radiation Oncology)  DIAGNOSIS:  Encounter Diagnosis  Name Primary?  . Diffuse large B-cell lymphoma of lymph nodes of neck ([place])     SUMMARY OF ONCOLOGIC HISTORY:   Breast cancer of upper-outer quadrant of right female breast ([place])   04/22/2009 - 03/27/2010 Anti-estrogen oral therapy    Neoadjuvant antiestrogen therapy with tamoxifen for 10 months      03/28/2010 Surgery    Right breast lumpectomy invasive ductal-lobular carcinoma intermediate grade 0.5 cm, 4 SLN negative, reexcision margins clear: ER 95% PR 95% HER-2 negative Ki-67 8%      06/03/2010 - 07/12/2010 Radiation Therapy    Radiation therapy to lumpectomy site ? Radiation recall related to tamoxifen      11/25/2010 -  Anti-estrogen oral therapy    Aromasin 25 mg daily.myalgias and arthralgias stop November 2012 went back to tamoxifen 20 mg daily      01/06/2017 Pathology Results    Bone marrow biopsy: Slightly hypercellular bone marrow for age with granulocytic hyperplasia, numerous small lymphoid aggregates present. 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 positive for LCA, CD20, CD79a PAX-5, BCL-6 and weak cytoplasmic kappa associated with patchy weak positivity for BCL-2 and CD10, Ki-67 40%      01/16/2017 -  Chemotherapy    R CHOP 4 cycles  Patient had tracheoesophageal fistula and pulmonary embolism       CHIEF COMPLIANT: Follow-up of diffuse large B-cell lymphoma.  And pulmonary embolism  INTERVAL HISTORY: Kristen Escobar is a 72 year old with above-mentioned history of diffuse large B-cell lymphoma  who finished 4 cycles of chemotherapy and had a dramatic response to treatment.  She was noted to have acute bilateral pulmonary emboli for which she is on anticoagulation with Lovenox.  She is here today to discuss overall treatment plan.  She appears to be doing much better with continuous tube feeds rather than bolus tube feeds.  She continues to cough up stuff but it is nowhere as much as she used to before.  She saw ENT at [facility] as well as a Electrical engineer who performed a laryngoscopy and determined that she may need an injection to her vocal cords.  Her voice itself has dramatically improved on its own.  REVIEW OF SYSTEMS:   Constitutional: Denies fevers, chills or abnormal weight loss Eyes: Denies blurriness of vision Ears, nose, mouth, throat, and face: Cough with a productive sputum related to tracheoesophageal fistula and reflux. Respiratory: Denies cough, dyspnea or wheezes Cardiovascular: Denies palpitation, chest discomfort Gastrointestinal: PEG tube feeds Skin: Denies abnormal skin rashes Lymphatics: Denies new lymphadenopathy or easy bruising Neurological:Denies numbness, tingling or new weaknesses Behavioral/Psych: Mood is stable, no new changes  Extremities: No lower extremity edema All other systems were reviewed with the patient and are negative.  I have reviewed the past medical history, past surgical history, social history and family history with the patient and they are unchanged from previous note.  ALLERGIES:  has No Known Allergies.  MEDICATIONS:  Current Outpatient Medications  Medication Sig Dispense Refill  . enoxaparin (LOVENOX) 80 MG/0.8ML injection Inject 0.8 mLs (80 mg total) into  the skin every 12 (twelve) hours. 22.4 Syringe 60  . furosemide (LASIX) 20 MG tablet Take 1 tablet (20 mg total) by mouth 2 (two) times daily. 60 tablet 1  . levothyroxine (SYNTHROID, LEVOTHROID) 50 MCG tablet Place 1 tablet (50 mcg total) into feeding tube daily.    Marland Kitchen  loperamide (IMODIUM A-D) 2 MG tablet Place 2 mg into feeding tube 4 (four) times daily as needed for diarrhea or loose stools.    Marland Kitchen omeprazole (PRILOSEC) 20 MG capsule Take 20 mg daily by mouth.     No current facility-administered medications for this visit.     PHYSICAL EXAMINATION: ECOG PERFORMANCE STATUS: 1 - Symptomatic but completely ambulatory  Vitals:   05/01/17 0916  BP: (!) 150/61  Pulse: 91  Resp: 18  Temp: 98.6 F (37 C)  SpO2: 98%   Filed Weights   05/01/17 0916  Weight: 199 lb 14.4 oz (90.7 kg)    GENERAL:alert, no distress and comfortable SKIN: skin color, texture, turgor are normal, no rashes or significant lesions EYES: normal, Conjunctiva are pink and non-injected, sclera clear OROPHARYNX:no exudate, no erythema and lips, buccal mucosa, and tongue normal  NECK: supple, thyroid normal size, non-tender, without nodularity LYMPH:  no palpable lymphadenopathy in the cervical, axillary or inguinal LUNGS: clear to auscultation and percussion with normal breathing effort HEART: regular rate & rhythm and no murmurs and no lower extremity edema ABDOMEN:abdomen soft, non-tender and normal bowel sounds, PEG tube present MUSCULOSKELETAL:no cyanosis of digits and no clubbing  NEURO: alert & oriented x 3 with fluent speech, no focal motor/sensory deficits EXTREMITIES: No lower extremity edema  LABORATORY DATA:  I have reviewed the data as listed CMP Latest Ref Rng & Units 05/01/2017 04/10/2017 03/20/2017  Glucose 70 - 140 mg/dL 103 126 107  BUN 7 - 26 mg/dL 19 15.9 8.4  Creatinine 0.60 - 1.10 mg/dL 0.85 0.8 0.8  Sodium 136 - 145 mmol/L 141 137 142  Potassium 3.3 - 4.7 mmol/L 4.5 3.8 3.6  Chloride 98 - 109 mmol/L 104 - -  CO2 22 - 29 mmol/L '27 25 24  '$ Calcium 8.4 - 10.4 mg/dL 9.3 8.4 8.3(L)  Total Protein 6.4 - 8.3 g/dL 6.5 5.8(L) 5.6(L)  Total Bilirubin 0.2 - 1.2 mg/dL 0.5 0.44 0.54  Alkaline Phos 40 - 150 U/L 59 46 48  AST 5 - 34 U/L 24 35(H) 28  ALT 0 - 55 U/L  35 52 31    Lab Results  Component Value Date   WBC 7.7 05/01/2017   HGB 10.9 (L) 05/01/2017   HCT 33.5 (L) 05/01/2017   MCV 92.4 05/01/2017   PLT 359 05/01/2017   NEUTROABS 5.4 05/01/2017    ASSESSMENT & PLAN:  Diffuse large B cell lymphoma (HCC) 12/26/2016 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 positive for LCA, CD20, CD79a PAX-5, BCL-6 and weak cytoplasmic kappa associated with patchy weak positivity for BCL-2 and CD10, Ki-67 40%  Stage IIb (bilateral cervical, esophageal, mediastinal, supraclavicular nodes)  Bone marrow biopsy negative for diffuse large B cell lymphoma but suggestive of small low-grade B cell population possibly SLL/CLL  Revised IPI score: 1, good prognosis, redictated for years PFS 80%, predicted overall survival 79% ------------------------------------------------------------------------ Current treatment: R CHOP cycle completed 4 cycles and treatment was stopped  TE Fistula: Seeing ENT Pulmonary embolism: Currently on Lovenox.  We will obtain a CT chest with angiogram to evaluate the blood clots.  The blood clots have improved well then she could undergo  tracheoesophageal fistula surgery.  I will call her surgeon after we see the results of the CT scan. Nutrition: Markedly improved since she is taking continuous infusions and it is up to 60 mL/h.  She may need a PET/CT scan to be done later on depending on the type of surgical intervention she gets.   I spent 25 minutes talking to the patient of which more than half was spent in counseling and coordination of care.  Orders Placed This Encounter  Procedures  . CT ANGIO CHEST PE W OR WO CONTRAST    Standing Status:   Future    Standing Expiration Date:   07/31/2018    Order Specific Question:   If indicated for the ordered procedure, I authorize the administration of contrast media per Radiology protocol    Answer:   Yes    Order Specific Question:   Preferred imaging  location?    Answer:   [facility]    Order Specific Question:   Radiology Contrast Protocol - do NOT remove file path    Answer:   [email or web address]    Order Specific Question:   Reason for Exam additional comments    Answer:   Lymphoma neck and CT PE evaluate for response to lymphoma and PE   The patient has a good understanding of the overall plan. she agrees with it. she will call with any problems that may develop before the next visit here.   Harriette Ohara, MD 05/01/17

## 2017-05-05 ENCOUNTER — Other Ambulatory Visit: Payer: Self-pay

## 2017-05-05 DIAGNOSIS — C8330 Diffuse large B-cell lymphoma, unspecified site: Secondary | ICD-10-CM | POA: Diagnosis not present

## 2017-05-05 DIAGNOSIS — S065X0D Traumatic subdural hemorrhage without loss of consciousness, subsequent encounter: Secondary | ICD-10-CM | POA: Diagnosis not present

## 2017-05-05 DIAGNOSIS — I129 Hypertensive chronic kidney disease with stage 1 through stage 4 chronic kidney disease, or unspecified chronic kidney disease: Secondary | ICD-10-CM | POA: Diagnosis not present

## 2017-05-05 DIAGNOSIS — Z431 Encounter for attention to gastrostomy: Secondary | ICD-10-CM | POA: Diagnosis not present

## 2017-05-05 DIAGNOSIS — N183 Chronic kidney disease, stage 3 (moderate): Secondary | ICD-10-CM | POA: Diagnosis not present

## 2017-05-05 DIAGNOSIS — L89312 Pressure ulcer of right buttock, stage 2: Secondary | ICD-10-CM | POA: Diagnosis not present

## 2017-05-05 MED ORDER — FUROSEMIDE 20 MG PO TABS: 20.0000 mg | ORAL_TABLET | Freq: Two times a day (BID) | ORAL | 1 refills | Status: DC

## 2017-05-06 DIAGNOSIS — I129 Hypertensive chronic kidney disease with stage 1 through stage 4 chronic kidney disease, or unspecified chronic kidney disease: Secondary | ICD-10-CM | POA: Diagnosis not present

## 2017-05-06 DIAGNOSIS — S065X0D Traumatic subdural hemorrhage without loss of consciousness, subsequent encounter: Secondary | ICD-10-CM | POA: Diagnosis not present

## 2017-05-06 DIAGNOSIS — N183 Chronic kidney disease, stage 3 (moderate): Secondary | ICD-10-CM | POA: Diagnosis not present

## 2017-05-06 DIAGNOSIS — Z431 Encounter for attention to gastrostomy: Secondary | ICD-10-CM | POA: Diagnosis not present

## 2017-05-06 DIAGNOSIS — L89312 Pressure ulcer of right buttock, stage 2: Secondary | ICD-10-CM | POA: Diagnosis not present

## 2017-05-06 DIAGNOSIS — C8330 Diffuse large B-cell lymphoma, unspecified site: Secondary | ICD-10-CM | POA: Diagnosis not present

## 2017-05-08 ENCOUNTER — Other Ambulatory Visit: Payer: Self-pay

## 2017-05-13 DIAGNOSIS — N183 Chronic kidney disease, stage 3 (moderate): Secondary | ICD-10-CM | POA: Diagnosis not present

## 2017-05-13 DIAGNOSIS — L89312 Pressure ulcer of right buttock, stage 2: Secondary | ICD-10-CM | POA: Diagnosis not present

## 2017-05-13 DIAGNOSIS — C8330 Diffuse large B-cell lymphoma, unspecified site: Secondary | ICD-10-CM | POA: Diagnosis not present

## 2017-05-13 DIAGNOSIS — I129 Hypertensive chronic kidney disease with stage 1 through stage 4 chronic kidney disease, or unspecified chronic kidney disease: Secondary | ICD-10-CM | POA: Diagnosis not present

## 2017-05-13 DIAGNOSIS — S065X0D Traumatic subdural hemorrhage without loss of consciousness, subsequent encounter: Secondary | ICD-10-CM | POA: Diagnosis not present

## 2017-05-13 DIAGNOSIS — Z431 Encounter for attention to gastrostomy: Secondary | ICD-10-CM | POA: Diagnosis not present

## 2017-05-20 DIAGNOSIS — S065X0D Traumatic subdural hemorrhage without loss of consciousness, subsequent encounter: Secondary | ICD-10-CM | POA: Diagnosis not present

## 2017-05-20 DIAGNOSIS — N183 Chronic kidney disease, stage 3 (moderate): Secondary | ICD-10-CM | POA: Diagnosis not present

## 2017-05-20 DIAGNOSIS — I129 Hypertensive chronic kidney disease with stage 1 through stage 4 chronic kidney disease, or unspecified chronic kidney disease: Secondary | ICD-10-CM | POA: Diagnosis not present

## 2017-05-20 DIAGNOSIS — Z431 Encounter for attention to gastrostomy: Secondary | ICD-10-CM | POA: Diagnosis not present

## 2017-05-20 DIAGNOSIS — C8330 Diffuse large B-cell lymphoma, unspecified site: Secondary | ICD-10-CM | POA: Diagnosis not present

## 2017-05-20 DIAGNOSIS — L89312 Pressure ulcer of right buttock, stage 2: Secondary | ICD-10-CM | POA: Diagnosis not present

## 2017-05-21 ENCOUNTER — Ambulatory Visit (HOSPITAL_COMMUNITY)
Admission: RE | Admit: 2017-05-21 | Discharge: 2017-05-21 | Disposition: A | Discharge status: Home / Self Care | Payer: Medicare Other | Source: Ambulatory Visit | Attending: Hematology and Oncology | Admitting: Hematology and Oncology

## 2017-05-21 DIAGNOSIS — C8331 Diffuse large B-cell lymphoma, lymph nodes of head, face, and neck: Secondary | ICD-10-CM

## 2017-05-21 DIAGNOSIS — I700 Atherosclerosis of aorta: Secondary | ICD-10-CM | POA: Insufficient documentation

## 2017-05-21 DIAGNOSIS — I2699 Other pulmonary embolism without acute cor pulmonale: Secondary | ICD-10-CM | POA: Diagnosis not present

## 2017-05-21 MED ORDER — HEPARIN SOD (PORK) LOCK FLUSH 100 UNIT/ML IV SOLN
500.0000 [IU] | Freq: Once | INTRAVENOUS | Status: AC
  Administered 2017-05-21: 500 [IU] via INTRAVENOUS

## 2017-05-21 MED ORDER — IOPAMIDOL (ISOVUE-370) INJECTION 76%
100.0000 mL | Freq: Once | INTRAVENOUS | Status: AC | PRN
  Administered 2017-05-21: 100 mL via INTRAVENOUS

## 2017-05-21 MED ORDER — IOPAMIDOL (ISOVUE-370) INJECTION 76%
INTRAVENOUS | Status: AC
  Filled 2017-05-21: qty 100

## 2017-05-21 MED ORDER — HEPARIN SOD (PORK) LOCK FLUSH 100 UNIT/ML IV SOLN
INTRAVENOUS | Status: AC
  Filled 2017-05-21: qty 5

## 2017-05-21 NOTE — Assessment & Plan Note (Signed)
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 positive for LCA, CD20, CD79a PAX-5, BCL-6 and weak cytoplasmic kappa associated with patchy weak positivity for BCL-2 and CD10, Ki-67 40%  Stage IIb(bilateral cervical, esophageal, mediastinal, supraclavicular nodes)  Bone marrow biopsy negative for diffuse large B cell lymphoma but suggestive of small low-grade B cell population possibly SLL/CLL  Revised IPI score: 1, good prognosis, redictated for years PFS 80%, predicted overall survival 79% ------------------------------------------------------------------------ Current treatment: R CHOPcycle completed 4 cycles and treatment was stopped  TE Fistula: Seeing ENT Pulmonary embolism: Currently on Lovenox.  We will obtain a CT chest with angiogram to evaluate the blood clots.  The blood clots have improved well then she could undergo tracheoesophageal fistula surgery.  I will call her surgeon after we see the results of the CT scan. Nutrition: Markedly improved since she is taking continuous infusions and it is up to 60 mL/h.  CT Chest 05/21/17: No PE, N0 LN, Resolved [place] lobe infection

## 2017-05-22 ENCOUNTER — Inpatient Hospital Stay: Payer: Medicare Other | Attending: Hematology and Oncology | Admitting: Hematology and Oncology

## 2017-05-22 DIAGNOSIS — Z923 Personal history of irradiation: Secondary | ICD-10-CM | POA: Diagnosis not present

## 2017-05-22 DIAGNOSIS — C8331 Diffuse large B-cell lymphoma, lymph nodes of head, face, and neck: Secondary | ICD-10-CM | POA: Diagnosis not present

## 2017-05-22 DIAGNOSIS — Z7901 Long term (current) use of anticoagulants: Secondary | ICD-10-CM | POA: Insufficient documentation

## 2017-05-22 DIAGNOSIS — I2699 Other pulmonary embolism without acute cor pulmonale: Secondary | ICD-10-CM | POA: Diagnosis not present

## 2017-05-22 DIAGNOSIS — C8338 Diffuse large B-cell lymphoma, lymph nodes of multiple sites: Secondary | ICD-10-CM | POA: Diagnosis not present

## 2017-05-22 NOTE — Progress Notes (Signed)
Patient Care Team: Marletta Lor, MD as PCP - General Marcy Panning, MD as Consulting Physician (Internal Medicine) Thea Silversmith, MD (Inactive) as Consulting Physician (Radiation Oncology)  DIAGNOSIS:  Encounter Diagnosis  Name Primary?  . 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 Radiation recall related to tamoxifen      11/25/2010 -  Anti-estrogen oral therapy    Aromasin 25 mg daily.myalgias and arthralgias stop November 2012 went back to tamoxifen 20 mg daily      01/06/2017 Pathology Results    Bone marrow biopsy: Slightly hypercellular bone marrow for age with granulocytic hyperplasia, numerous small lymphoid aggregates present. This may represent minimal involvement by low-grade B-cell lymphoma or liver process like SLL/CLL because they are CD5 positive       Diffuse large B cell lymphoma ([place])   12/26/2016 Initial Diagnosis    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 positive for LCA, CD20, CD79a PAX-5, BCL-6 and weak cytoplasmic kappa associated with patchy weak positivity for BCL-2 and CD10, Ki-67 40%      01/16/2017 -  Chemotherapy    R CHOP 4 cycles  Patient had tracheoesophageal fistula and pulmonary embolism       CHIEF COMPLIANT: Follow-up to review the CT scans done after completion of chemotherapy  INTERVAL HISTORY: Kristen Escobar is a 72 year old with above-mentioned history of diffuse large B-cell  lymphoma who finished 4 cycles of R CHOP chemotherapy.  She could not finish all 6 cycles.  She had pulmonary embolism as well which was treated with Lovenox injections.  She also had tracheoesophageal fistula which is being evaluated at [facility] for closure.  She underwent a CT scan of the chest with PE protocol to see whether she can undergo surgery.  The CT scan showed no evidence of PE.  No evidence of lymphoma and resolution of the infection in the lungs.  Her energy levels have recovered and her voice is also coming back.  She still continues to have a cough with expectoration but it is not as profound as it used to be.  Her nutrition has improved with tube feeds.  She craves for water and pizza.  REVIEW OF SYSTEMS:   Constitutional: Denies fevers, chills or abnormal weight loss Eyes: Denies blurriness of vision Ears, nose, mouth, throat, and face: Denies mucositis or sore throat Respiratory: Denies cough, dyspnea or wheezes Cardiovascular: Denies palpitation, chest discomfort Gastrointestinal: PEG tube Skin: Denies abnormal skin rashes Lymphatics: Denies new lymphadenopathy or easy bruising Neurological:Denies numbness, tingling or new weaknesses Behavioral/Psych: Mood is stable, no new changes  Extremities: No lower extremity edema  All other systems were reviewed with the patient and are negative.  I have reviewed the past medical history, past surgical history, social history and family history with the patient and they are unchanged from previous note.  ALLERGIES:  has No Known Allergies.  MEDICATIONS:  Current Outpatient Medications  Medication Sig Dispense Refill  .  allopurinol (ZYLOPRIM) 300 MG tablet Take 1 tablet (300 mg total) by mouth daily. 90 tablet 1  . enoxaparin (LOVENOX) 80 MG/0.8ML injection Inject 0.8 mLs (80 mg total) into the skin every 12 (twelve) hours. 22.4 Syringe 60  . furosemide (LASIX) 20 MG tablet Take 1 tablet (20 mg total) by mouth 2 (two) times  daily. 60 tablet 1  . levothyroxine (SYNTHROID, LEVOTHROID) 50 MCG tablet Place 1 tablet (50 mcg total) into feeding tube daily.    Marland Kitchen loperamide (IMODIUM A-D) 2 MG tablet Place 2 mg into feeding tube 4 (four) times daily as needed for diarrhea or loose stools.    Marland Kitchen omeprazole (PRILOSEC) 20 MG capsule Take 20 mg daily by mouth.     No current facility-administered medications for this visit.     PHYSICAL EXAMINATION: ECOG PERFORMANCE STATUS: 1 - Symptomatic but completely ambulatory  Vitals:   05/22/17 0859  BP: (!) 156/65  Pulse: 82  Resp: 18  Temp: 98.2 F (36.8 C)  SpO2: 99%   Filed Weights   05/22/17 0859  Weight: 200 lb 6.4 oz (90.9 kg)    GENERAL:alert, no distress and comfortable SKIN: skin color, texture, turgor are normal, no rashes or significant lesions EYES: normal, Conjunctiva are pink and non-injected, sclera clear OROPHARYNX:no exudate, no erythema and lips, buccal mucosa, and tongue normal  NECK: supple, thyroid normal size, non-tender, without nodularity LYMPH:  no palpable lymphadenopathy in the cervical, axillary or inguinal LUNGS: clear to auscultation and percussion with normal breathing effort HEART: regular rate & rhythm and no murmurs and no lower extremity edema ABDOMEN:abdomen soft, non-tender and normal bowel sounds MUSCULOSKELETAL:no cyanosis of digits and no clubbing  NEURO: alert & oriented x 3 with fluent speech, no focal motor/sensory deficits EXTREMITIES: No lower extremity edema  LABORATORY DATA:  I have reviewed the data as listed CMP Latest Ref Rng & Units 05/01/2017 04/10/2017 03/20/2017  Glucose 70 - 140 mg/dL 103 126 107  BUN 7 - 26 mg/dL 19 15.9 8.4  Creatinine 0.60 - 1.10 mg/dL 0.85 0.8 0.8  Sodium 136 - 145 mmol/L 141 137 142  Potassium 3.3 - 4.7 mmol/L 4.5 3.8 3.6  Chloride 98 - 109 mmol/L 104 - -  CO2 22 - 29 mmol/L '27 25 24  '$ Calcium 8.4 - 10.4 mg/dL 9.3 8.4 8.3(L)  Total Protein 6.4 - 8.3 g/dL 6.5 5.8(L) 5.6(L)  Total  Bilirubin 0.2 - 1.2 mg/dL 0.5 0.44 0.54  Alkaline Phos 40 - 150 U/L 59 46 48  AST 5 - 34 U/L 24 35(H) 28  ALT 0 - 55 U/L 35 52 31    Lab Results  Component Value Date   WBC 7.7 05/01/2017   HGB 10.9 (L) 05/01/2017   HCT 33.5 (L) 05/01/2017   MCV 92.4 05/01/2017   PLT 359 05/01/2017   NEUTROABS 5.4 05/01/2017    ASSESSMENT & PLAN:  Diffuse large B cell lymphoma (HCC) 12/26/2016 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 positive for LCA, CD20, CD79a PAX-5, BCL-6 and weak cytoplasmic kappa associated with patchy weak positivity for BCL-2 and CD10, Ki-67 40%  Stage IIb(bilateral cervical, esophageal, mediastinal, supraclavicular nodes)  Bone marrow biopsy negative for diffuse large B cell lymphoma but suggestive of small low-grade B cell population possibly SLL/CLL  Revised IPI score: 1, good prognosis, redictated for years PFS 80%, predicted overall survival 79% ------------------------------------------------------------------------ Current treatment: R CHOPcycle completed 4 cycles and treatment was stopped  TE Fistula: Seeing ENT Pulmonary embolism: Currently on  Lovenox.  We will obtain a CT chest with angiogram to evaluate the blood clots.  The blood clots have improved well then she could undergo tracheoesophageal fistula surgery.  I will call her surgeon after we see the results of the CT scan. Nutrition: Markedly improved since she is taking continuous infusions and it is up to 60 mL/h.  CT Chest 05/21/17: No PE, N0 LN, Resolved Lowe lobe infection I discussed with her that she is free to proceed with surgical correction of the tracheoesophageal fistula. I would like to see her back a few weeks after surgery to go over the results. Our plan is to obtain a PET/CT scan in 6 months.  I spent 25 minutes talking to the patient of which more than half was spent in counseling and coordination of care.  No orders of the defined types were placed  in this encounter.  The patient has a good understanding of the overall plan. she agrees with it. she will call with any problems that may develop before the next visit here.   Harriette Ohara, MD 05/22/17

## 2017-05-26 DIAGNOSIS — J860 Pyothorax with fistula: Secondary | ICD-10-CM | POA: Diagnosis not present

## 2017-05-26 DIAGNOSIS — Z87891 Personal history of nicotine dependence: Secondary | ICD-10-CM | POA: Diagnosis not present

## 2017-05-26 DIAGNOSIS — J3801 Paralysis of vocal cords and larynx, unilateral: Secondary | ICD-10-CM | POA: Diagnosis not present

## 2017-05-26 DIAGNOSIS — J3800 Paralysis of vocal cords and larynx, unspecified: Secondary | ICD-10-CM | POA: Diagnosis not present

## 2017-05-27 DIAGNOSIS — S065X0D Traumatic subdural hemorrhage without loss of consciousness, subsequent encounter: Secondary | ICD-10-CM | POA: Diagnosis not present

## 2017-05-27 DIAGNOSIS — I129 Hypertensive chronic kidney disease with stage 1 through stage 4 chronic kidney disease, or unspecified chronic kidney disease: Secondary | ICD-10-CM | POA: Diagnosis not present

## 2017-05-27 DIAGNOSIS — L89312 Pressure ulcer of right buttock, stage 2: Secondary | ICD-10-CM | POA: Diagnosis not present

## 2017-05-27 DIAGNOSIS — Z431 Encounter for attention to gastrostomy: Secondary | ICD-10-CM | POA: Diagnosis not present

## 2017-05-27 DIAGNOSIS — N183 Chronic kidney disease, stage 3 (moderate): Secondary | ICD-10-CM | POA: Diagnosis not present

## 2017-05-27 DIAGNOSIS — C8330 Diffuse large B-cell lymphoma, unspecified site: Secondary | ICD-10-CM | POA: Diagnosis not present

## 2017-05-29 ENCOUNTER — Telehealth: Payer: Self-pay

## 2017-05-29 NOTE — Telephone Encounter (Signed)
Pt husband, [name], called to see if he could speak with Dr.Gudena to discuss pt's upcoming surgery to close her fistula on 2/21 at [facility]. He had some questions about the surgery and would like to Dr.Gudena's opinion to make sure they are making the right decision.   Roe Rutherford that Dr.Gudena will not be back until Monday and we would be glad to notify him when he gets back. Pt husband is aware and will expect a call from [facility] next week if possible to clarify some concerns.   No further needs at this time.

## 2017-06-02 DIAGNOSIS — S065X0D Traumatic subdural hemorrhage without loss of consciousness, subsequent encounter: Secondary | ICD-10-CM | POA: Diagnosis not present

## 2017-06-02 DIAGNOSIS — N183 Chronic kidney disease, stage 3 (moderate): Secondary | ICD-10-CM | POA: Diagnosis not present

## 2017-06-02 DIAGNOSIS — I129 Hypertensive chronic kidney disease with stage 1 through stage 4 chronic kidney disease, or unspecified chronic kidney disease: Secondary | ICD-10-CM | POA: Diagnosis not present

## 2017-06-02 DIAGNOSIS — Z431 Encounter for attention to gastrostomy: Secondary | ICD-10-CM | POA: Diagnosis not present

## 2017-06-02 DIAGNOSIS — C8330 Diffuse large B-cell lymphoma, unspecified site: Secondary | ICD-10-CM | POA: Diagnosis not present

## 2017-06-02 DIAGNOSIS — L89312 Pressure ulcer of right buttock, stage 2: Secondary | ICD-10-CM | POA: Diagnosis not present

## 2017-06-04 ENCOUNTER — Telehealth: Payer: Self-pay | Admitting: Internal Medicine

## 2017-06-04 DIAGNOSIS — D72829 Elevated white blood cell count, unspecified: Secondary | ICD-10-CM | POA: Diagnosis not present

## 2017-06-04 DIAGNOSIS — R490 Dysphonia: Secondary | ICD-10-CM | POA: Diagnosis not present

## 2017-06-04 DIAGNOSIS — Z86711 Personal history of pulmonary embolism: Secondary | ICD-10-CM | POA: Diagnosis not present

## 2017-06-04 DIAGNOSIS — T8183XA Persistent postprocedural fistula, initial encounter: Secondary | ICD-10-CM | POA: Diagnosis not present

## 2017-06-04 DIAGNOSIS — K219 Gastro-esophageal reflux disease without esophagitis: Secondary | ICD-10-CM | POA: Diagnosis not present

## 2017-06-04 DIAGNOSIS — E669 Obesity, unspecified: Secondary | ICD-10-CM | POA: Diagnosis not present

## 2017-06-04 DIAGNOSIS — Z87891 Personal history of nicotine dependence: Secondary | ICD-10-CM | POA: Diagnosis not present

## 2017-06-04 DIAGNOSIS — Z931 Gastrostomy status: Secondary | ICD-10-CM | POA: Diagnosis not present

## 2017-06-04 DIAGNOSIS — Z6836 Body mass index (BMI) 36.0-36.9, adult: Secondary | ICD-10-CM | POA: Diagnosis not present

## 2017-06-04 DIAGNOSIS — Z79890 Hormone replacement therapy (postmenopausal): Secondary | ICD-10-CM | POA: Diagnosis not present

## 2017-06-04 DIAGNOSIS — D649 Anemia, unspecified: Secondary | ICD-10-CM | POA: Insufficient documentation

## 2017-06-04 DIAGNOSIS — Z853 Personal history of malignant neoplasm of breast: Secondary | ICD-10-CM | POA: Diagnosis not present

## 2017-06-04 DIAGNOSIS — E039 Hypothyroidism, unspecified: Secondary | ICD-10-CM | POA: Diagnosis not present

## 2017-06-04 DIAGNOSIS — J3801 Paralysis of vocal cords and larynx, unilateral: Secondary | ICD-10-CM | POA: Diagnosis not present

## 2017-06-04 DIAGNOSIS — J860 Pyothorax with fistula: Secondary | ICD-10-CM | POA: Diagnosis not present

## 2017-06-04 NOTE — Telephone Encounter (Signed)
Copied from [place] [id number]. Topic: Quick Communication - See Telephone Encounter >> Jun 04, 2017 11:01 AM Cleaster Corin, NT wrote: CRM for notification. See Telephone encounter for:   06/04/17.pt. Husband calling to let Dr. Burnice Logan that pt. Med. Levothyroxine the generic is on backorder. And pt. Needs another med. To be called in .she will be out of meds. Tomorrow. Pt. Can be reached at [facility] [address] [place] [address] [place] [place] [address] Phone: [phone] Fax: [phone]

## 2017-06-04 NOTE — Telephone Encounter (Signed)
Please okay substitute for levothyroxine No. 90

## 2017-06-05 ENCOUNTER — Other Ambulatory Visit: Payer: Self-pay

## 2017-06-05 NOTE — Telephone Encounter (Signed)
Husband wanted to note pharmacy is  [facility] [address] [place] - [address]  [place] [place] [address]  Phone: [phone] Fax: [phone]   Also wanted to let Dr. Raliegh Ip know that pt is having surgery next Thursday on her esophogas at [facility]

## 2017-06-05 NOTE — Telephone Encounter (Signed)
Called pharmacy and okayed substitute for levothyroxine. No new rx needed at this time, they have rxs on file that they can fill. Pt's husband notified of results/instructions and verbalized understanding.

## 2017-06-05 NOTE — Telephone Encounter (Signed)
Husband called to follow up on RX pt out today - please advise Call back @ [phone] or [phone]

## 2017-06-11 ENCOUNTER — Other Ambulatory Visit: Payer: Self-pay

## 2017-06-11 DIAGNOSIS — Z931 Gastrostomy status: Secondary | ICD-10-CM | POA: Diagnosis not present

## 2017-06-11 DIAGNOSIS — M799 Soft tissue disorder, unspecified: Secondary | ICD-10-CM | POA: Diagnosis not present

## 2017-06-11 DIAGNOSIS — J860 Pyothorax with fistula: Secondary | ICD-10-CM | POA: Diagnosis not present

## 2017-06-11 DIAGNOSIS — K219 Gastro-esophageal reflux disease without esophagitis: Secondary | ICD-10-CM | POA: Diagnosis present

## 2017-06-11 DIAGNOSIS — D649 Anemia, unspecified: Secondary | ICD-10-CM | POA: Diagnosis present

## 2017-06-11 DIAGNOSIS — R490 Dysphonia: Secondary | ICD-10-CM | POA: Diagnosis not present

## 2017-06-11 DIAGNOSIS — Z853 Personal history of malignant neoplasm of breast: Secondary | ICD-10-CM | POA: Diagnosis not present

## 2017-06-11 DIAGNOSIS — D72829 Elevated white blood cell count, unspecified: Secondary | ICD-10-CM | POA: Diagnosis present

## 2017-06-11 DIAGNOSIS — J9504 Tracheo-esophageal fistula following tracheostomy: Secondary | ICD-10-CM | POA: Diagnosis not present

## 2017-06-11 DIAGNOSIS — Z6836 Body mass index (BMI) 36.0-36.9, adult: Secondary | ICD-10-CM | POA: Diagnosis not present

## 2017-06-11 DIAGNOSIS — E039 Hypothyroidism, unspecified: Secondary | ICD-10-CM | POA: Diagnosis present

## 2017-06-11 DIAGNOSIS — Z79890 Hormone replacement therapy (postmenopausal): Secondary | ICD-10-CM | POA: Diagnosis not present

## 2017-06-11 DIAGNOSIS — Z86711 Personal history of pulmonary embolism: Secondary | ICD-10-CM | POA: Diagnosis not present

## 2017-06-11 DIAGNOSIS — T8183XA Persistent postprocedural fistula, initial encounter: Secondary | ICD-10-CM | POA: Diagnosis not present

## 2017-06-11 DIAGNOSIS — E669 Obesity, unspecified: Secondary | ICD-10-CM | POA: Diagnosis present

## 2017-06-11 DIAGNOSIS — Z87891 Personal history of nicotine dependence: Secondary | ICD-10-CM | POA: Diagnosis not present

## 2017-06-11 DIAGNOSIS — J3801 Paralysis of vocal cords and larynx, unilateral: Secondary | ICD-10-CM | POA: Diagnosis not present

## 2017-06-11 MED ORDER — FUROSEMIDE 20 MG PO TABS: 20.0000 mg | ORAL_TABLET | Freq: Two times a day (BID) | ORAL | 1 refills | Status: DC

## 2017-06-15 ENCOUNTER — Telehealth: Payer: Self-pay | Admitting: *Deleted

## 2017-06-15 NOTE — Telephone Encounter (Signed)
"  Dr. Duanne Limerick (Pager [phone]) with Dr. Nicolette Bang at [facility]..  Calling to request Dr. Geralyn Flash plan for resuming Lovenox for this patient.  Admitted S/P open tracheal fissure repair on 06-11-2017 currently on prophylactic Lovenox dose.  Page me to discuss this mutual patient."

## 2017-06-20 MED ORDER — ONDANSETRON HCL 4 MG/2ML IJ SOLN
4.00 | INTRAMUSCULAR | Status: DC
Start: ? — End: 2017-06-20

## 2017-06-20 MED ORDER — GENERIC EXTERNAL MEDICATION
40.00 | Status: DC
Start: 2017-06-21 — End: 2017-06-20

## 2017-06-20 MED ORDER — LEVOTHYROXINE SODIUM 50 MCG PO TABS
50.00 | ORAL_TABLET | ORAL | Status: DC
Start: 2017-06-21 — End: 2017-06-20

## 2017-06-20 MED ORDER — FUROSEMIDE 20 MG PO TABS
20.00 | ORAL_TABLET | ORAL | Status: DC
Start: 2017-06-21 — End: 2017-06-20

## 2017-06-20 MED ORDER — GENERIC EXTERNAL MEDICATION
1.00 | Status: DC
Start: 2017-06-20 — End: 2017-06-20

## 2017-06-20 MED ORDER — GENERIC EXTERNAL MEDICATION
1.00 | Status: DC
Start: 2017-06-21 — End: 2017-06-20

## 2017-06-20 MED ORDER — LOPERAMIDE HCL 1 MG/5ML PO LIQD
4.00 | ORAL | Status: DC
Start: ? — End: 2017-06-20

## 2017-06-20 MED ORDER — ENOXAPARIN SODIUM 40 MG/0.4ML ~~LOC~~ SOLN
40.00 | SUBCUTANEOUS | Status: DC
Start: 2017-06-21 — End: 2017-06-20

## 2017-06-20 MED ORDER — ACETAMINOPHEN 325 MG PO TABS
650.00 | ORAL_TABLET | ORAL | Status: DC
Start: 2017-06-20 — End: 2017-06-20

## 2017-06-20 MED ORDER — GENERIC EXTERNAL MEDICATION
3.00 g | Status: DC
Start: 2017-06-20 — End: 2017-06-20

## 2017-06-20 MED ORDER — POLYETHYLENE GLYCOL 3350 17 G PO PACK
17.00 g | PACK | ORAL | Status: DC
Start: ? — End: 2017-06-20

## 2017-06-20 MED ORDER — OXYCODONE HCL 5 MG PO TABS
5.00 | ORAL_TABLET | ORAL | Status: DC
Start: ? — End: 2017-06-20

## 2017-06-21 DIAGNOSIS — C8330 Diffuse large B-cell lymphoma, unspecified site: Secondary | ICD-10-CM | POA: Diagnosis not present

## 2017-06-21 DIAGNOSIS — Z431 Encounter for attention to gastrostomy: Secondary | ICD-10-CM | POA: Diagnosis not present

## 2017-06-21 DIAGNOSIS — N183 Chronic kidney disease, stage 3 (moderate): Secondary | ICD-10-CM | POA: Diagnosis not present

## 2017-06-21 DIAGNOSIS — S065X0D Traumatic subdural hemorrhage without loss of consciousness, subsequent encounter: Secondary | ICD-10-CM | POA: Diagnosis not present

## 2017-06-21 DIAGNOSIS — I129 Hypertensive chronic kidney disease with stage 1 through stage 4 chronic kidney disease, or unspecified chronic kidney disease: Secondary | ICD-10-CM | POA: Diagnosis not present

## 2017-06-21 DIAGNOSIS — L89312 Pressure ulcer of right buttock, stage 2: Secondary | ICD-10-CM | POA: Diagnosis not present

## 2017-06-22 DIAGNOSIS — S1190XD Unspecified open wound of unspecified part of neck, subsequent encounter: Secondary | ICD-10-CM | POA: Diagnosis not present

## 2017-06-22 DIAGNOSIS — J860 Pyothorax with fistula: Secondary | ICD-10-CM | POA: Diagnosis not present

## 2017-06-24 ENCOUNTER — Telehealth: Payer: Self-pay

## 2017-06-24 ENCOUNTER — Other Ambulatory Visit: Payer: Self-pay

## 2017-06-24 DIAGNOSIS — N183 Chronic kidney disease, stage 3 (moderate): Secondary | ICD-10-CM | POA: Diagnosis not present

## 2017-06-24 DIAGNOSIS — L89312 Pressure ulcer of right buttock, stage 2: Secondary | ICD-10-CM | POA: Diagnosis not present

## 2017-06-24 DIAGNOSIS — Z431 Encounter for attention to gastrostomy: Secondary | ICD-10-CM | POA: Diagnosis not present

## 2017-06-24 DIAGNOSIS — I129 Hypertensive chronic kidney disease with stage 1 through stage 4 chronic kidney disease, or unspecified chronic kidney disease: Secondary | ICD-10-CM | POA: Diagnosis not present

## 2017-06-24 DIAGNOSIS — C8330 Diffuse large B-cell lymphoma, unspecified site: Secondary | ICD-10-CM | POA: Diagnosis not present

## 2017-06-24 DIAGNOSIS — S065X0D Traumatic subdural hemorrhage without loss of consciousness, subsequent encounter: Secondary | ICD-10-CM | POA: Diagnosis not present

## 2017-06-24 MED ORDER — HEPARIN SOD (PORK) LOCK FLUSH 100 UNIT/ML IV SOLN: 500.0000 [IU] | Freq: Once | INTRAVENOUS | 0 refills | Status: DC

## 2017-06-24 MED ORDER — SODIUM CHLORIDE FLUSH 0.9 % IV SOLN: 10.0000 mL | INTRAVENOUS | 0 refills | Status: DC | PRN

## 2017-06-24 NOTE — Telephone Encounter (Signed)
[facility] nurse called for orders for port flushes. Orders placed and faced to [facility]. Saline and Heparin flushes ordered at [facility].  Cyndia Bent RN

## 2017-06-25 ENCOUNTER — Ambulatory Visit: Payer: Medicare Other | Admitting: Hematology and Oncology

## 2017-06-26 DIAGNOSIS — L89322 Pressure ulcer of left buttock, stage 2: Secondary | ICD-10-CM | POA: Diagnosis not present

## 2017-06-26 DIAGNOSIS — I129 Hypertensive chronic kidney disease with stage 1 through stage 4 chronic kidney disease, or unspecified chronic kidney disease: Secondary | ICD-10-CM | POA: Diagnosis not present

## 2017-06-26 DIAGNOSIS — D631 Anemia in chronic kidney disease: Secondary | ICD-10-CM | POA: Diagnosis not present

## 2017-06-26 DIAGNOSIS — W101XXD Fall (on)(from) sidewalk curb, subsequent encounter: Secondary | ICD-10-CM | POA: Diagnosis not present

## 2017-06-26 DIAGNOSIS — Z8572 Personal history of non-Hodgkin lymphomas: Secondary | ICD-10-CM | POA: Diagnosis not present

## 2017-06-26 DIAGNOSIS — S065X0D Traumatic subdural hemorrhage without loss of consciousness, subsequent encounter: Secondary | ICD-10-CM | POA: Diagnosis not present

## 2017-06-26 DIAGNOSIS — E039 Hypothyroidism, unspecified: Secondary | ICD-10-CM | POA: Diagnosis not present

## 2017-06-26 DIAGNOSIS — N183 Chronic kidney disease, stage 3 (moderate): Secondary | ICD-10-CM | POA: Diagnosis not present

## 2017-06-26 DIAGNOSIS — Z452 Encounter for adjustment and management of vascular access device: Secondary | ICD-10-CM | POA: Diagnosis not present

## 2017-06-26 DIAGNOSIS — I739 Peripheral vascular disease, unspecified: Secondary | ICD-10-CM | POA: Diagnosis not present

## 2017-06-26 DIAGNOSIS — M1990 Unspecified osteoarthritis, unspecified site: Secondary | ICD-10-CM | POA: Diagnosis not present

## 2017-06-26 DIAGNOSIS — L89312 Pressure ulcer of right buttock, stage 2: Secondary | ICD-10-CM | POA: Diagnosis not present

## 2017-06-26 DIAGNOSIS — Z7901 Long term (current) use of anticoagulants: Secondary | ICD-10-CM | POA: Diagnosis not present

## 2017-06-26 DIAGNOSIS — Z89021 Acquired absence of right finger(s): Secondary | ICD-10-CM | POA: Diagnosis not present

## 2017-06-26 DIAGNOSIS — Z853 Personal history of malignant neoplasm of breast: Secondary | ICD-10-CM | POA: Diagnosis not present

## 2017-06-26 DIAGNOSIS — Z86711 Personal history of pulmonary embolism: Secondary | ICD-10-CM | POA: Diagnosis not present

## 2017-06-26 DIAGNOSIS — Z87891 Personal history of nicotine dependence: Secondary | ICD-10-CM | POA: Diagnosis not present

## 2017-06-26 DIAGNOSIS — Z48813 Encounter for surgical aftercare following surgery on the respiratory system: Secondary | ICD-10-CM | POA: Diagnosis not present

## 2017-06-26 DIAGNOSIS — K219 Gastro-esophageal reflux disease without esophagitis: Secondary | ICD-10-CM | POA: Diagnosis not present

## 2017-06-26 DIAGNOSIS — D696 Thrombocytopenia, unspecified: Secondary | ICD-10-CM | POA: Diagnosis not present

## 2017-06-26 DIAGNOSIS — E669 Obesity, unspecified: Secondary | ICD-10-CM | POA: Diagnosis not present

## 2017-06-26 DIAGNOSIS — Z431 Encounter for attention to gastrostomy: Secondary | ICD-10-CM | POA: Diagnosis not present

## 2017-06-26 DIAGNOSIS — Z89022 Acquired absence of left finger(s): Secondary | ICD-10-CM | POA: Diagnosis not present

## 2017-06-26 DIAGNOSIS — J320 Chronic maxillary sinusitis: Secondary | ICD-10-CM | POA: Diagnosis not present

## 2017-06-26 DIAGNOSIS — Z4801 Encounter for change or removal of surgical wound dressing: Secondary | ICD-10-CM | POA: Diagnosis not present

## 2017-06-29 DIAGNOSIS — Z4801 Encounter for change or removal of surgical wound dressing: Secondary | ICD-10-CM | POA: Diagnosis not present

## 2017-07-01 DIAGNOSIS — J860 Pyothorax with fistula: Secondary | ICD-10-CM | POA: Diagnosis not present

## 2017-07-01 DIAGNOSIS — C8331 Diffuse large B-cell lymphoma, lymph nodes of head, face, and neck: Secondary | ICD-10-CM | POA: Diagnosis not present

## 2017-07-01 DIAGNOSIS — Z7901 Long term (current) use of anticoagulants: Secondary | ICD-10-CM | POA: Diagnosis not present

## 2017-07-01 DIAGNOSIS — Z7189 Other specified counseling: Secondary | ICD-10-CM | POA: Diagnosis not present

## 2017-07-01 DIAGNOSIS — Z923 Personal history of irradiation: Secondary | ICD-10-CM | POA: Diagnosis not present

## 2017-07-01 DIAGNOSIS — Z931 Gastrostomy status: Secondary | ICD-10-CM | POA: Diagnosis not present

## 2017-07-01 DIAGNOSIS — Z86711 Personal history of pulmonary embolism: Secondary | ICD-10-CM | POA: Diagnosis not present

## 2017-07-01 DIAGNOSIS — Z9221 Personal history of antineoplastic chemotherapy: Secondary | ICD-10-CM | POA: Diagnosis not present

## 2017-07-01 DIAGNOSIS — Z853 Personal history of malignant neoplasm of breast: Secondary | ICD-10-CM | POA: Diagnosis not present

## 2017-07-01 DIAGNOSIS — Z87891 Personal history of nicotine dependence: Secondary | ICD-10-CM | POA: Diagnosis not present

## 2017-07-02 ENCOUNTER — Telehealth: Payer: Self-pay

## 2017-07-02 DIAGNOSIS — Z431 Encounter for attention to gastrostomy: Secondary | ICD-10-CM | POA: Diagnosis not present

## 2017-07-02 DIAGNOSIS — L89312 Pressure ulcer of right buttock, stage 2: Secondary | ICD-10-CM | POA: Diagnosis not present

## 2017-07-02 DIAGNOSIS — Z48813 Encounter for surgical aftercare following surgery on the respiratory system: Secondary | ICD-10-CM | POA: Diagnosis not present

## 2017-07-02 DIAGNOSIS — L89322 Pressure ulcer of left buttock, stage 2: Secondary | ICD-10-CM | POA: Diagnosis not present

## 2017-07-02 DIAGNOSIS — N183 Chronic kidney disease, stage 3 (moderate): Secondary | ICD-10-CM | POA: Diagnosis not present

## 2017-07-02 DIAGNOSIS — I129 Hypertensive chronic kidney disease with stage 1 through stage 4 chronic kidney disease, or unspecified chronic kidney disease: Secondary | ICD-10-CM | POA: Diagnosis not present

## 2017-07-02 NOTE — Telephone Encounter (Signed)
Returned call to [facility] nurse "Patty" regarding needing order for port flush orders.  Reviewed notes and orders were faxed on 06/24/2017.  Refaxed to [facility] at this time, per Patty to the attention of Nursing. No other needs at this time.

## 2017-07-03 DIAGNOSIS — Z431 Encounter for attention to gastrostomy: Secondary | ICD-10-CM | POA: Diagnosis not present

## 2017-07-03 DIAGNOSIS — L89312 Pressure ulcer of right buttock, stage 2: Secondary | ICD-10-CM | POA: Diagnosis not present

## 2017-07-03 DIAGNOSIS — Z48813 Encounter for surgical aftercare following surgery on the respiratory system: Secondary | ICD-10-CM | POA: Diagnosis not present

## 2017-07-03 DIAGNOSIS — I129 Hypertensive chronic kidney disease with stage 1 through stage 4 chronic kidney disease, or unspecified chronic kidney disease: Secondary | ICD-10-CM | POA: Diagnosis not present

## 2017-07-03 DIAGNOSIS — N183 Chronic kidney disease, stage 3 (moderate): Secondary | ICD-10-CM | POA: Diagnosis not present

## 2017-07-03 DIAGNOSIS — L89322 Pressure ulcer of left buttock, stage 2: Secondary | ICD-10-CM | POA: Diagnosis not present

## 2017-07-06 DIAGNOSIS — Z431 Encounter for attention to gastrostomy: Secondary | ICD-10-CM | POA: Diagnosis not present

## 2017-07-06 DIAGNOSIS — I129 Hypertensive chronic kidney disease with stage 1 through stage 4 chronic kidney disease, or unspecified chronic kidney disease: Secondary | ICD-10-CM | POA: Diagnosis not present

## 2017-07-06 DIAGNOSIS — N183 Chronic kidney disease, stage 3 (moderate): Secondary | ICD-10-CM | POA: Diagnosis not present

## 2017-07-06 DIAGNOSIS — Z48813 Encounter for surgical aftercare following surgery on the respiratory system: Secondary | ICD-10-CM | POA: Diagnosis not present

## 2017-07-06 DIAGNOSIS — L89322 Pressure ulcer of left buttock, stage 2: Secondary | ICD-10-CM | POA: Diagnosis not present

## 2017-07-06 DIAGNOSIS — L89312 Pressure ulcer of right buttock, stage 2: Secondary | ICD-10-CM | POA: Diagnosis not present

## 2017-07-14 DIAGNOSIS — N183 Chronic kidney disease, stage 3 (moderate): Secondary | ICD-10-CM | POA: Diagnosis not present

## 2017-07-14 DIAGNOSIS — L89312 Pressure ulcer of right buttock, stage 2: Secondary | ICD-10-CM | POA: Diagnosis not present

## 2017-07-14 DIAGNOSIS — Z431 Encounter for attention to gastrostomy: Secondary | ICD-10-CM | POA: Diagnosis not present

## 2017-07-14 DIAGNOSIS — Z48813 Encounter for surgical aftercare following surgery on the respiratory system: Secondary | ICD-10-CM | POA: Diagnosis not present

## 2017-07-14 DIAGNOSIS — L89322 Pressure ulcer of left buttock, stage 2: Secondary | ICD-10-CM | POA: Diagnosis not present

## 2017-07-14 DIAGNOSIS — I129 Hypertensive chronic kidney disease with stage 1 through stage 4 chronic kidney disease, or unspecified chronic kidney disease: Secondary | ICD-10-CM | POA: Diagnosis not present

## 2017-07-15 ENCOUNTER — Telehealth: Payer: Self-pay | Admitting: *Deleted

## 2017-07-15 NOTE — Telephone Encounter (Signed)
Spoke with Kristen Escobar from [facility] who stated,"we are discharging the patient from our nursing care because her wounds have healed. She is following up with the surgeon and Dr. Lindi Adie next week." Instructed her I would give the message to Dr. Lindi Adie.

## 2017-07-16 DIAGNOSIS — L89322 Pressure ulcer of left buttock, stage 2: Secondary | ICD-10-CM | POA: Diagnosis not present

## 2017-07-16 DIAGNOSIS — I129 Hypertensive chronic kidney disease with stage 1 through stage 4 chronic kidney disease, or unspecified chronic kidney disease: Secondary | ICD-10-CM | POA: Diagnosis not present

## 2017-07-16 DIAGNOSIS — Z431 Encounter for attention to gastrostomy: Secondary | ICD-10-CM | POA: Diagnosis not present

## 2017-07-16 DIAGNOSIS — Z48813 Encounter for surgical aftercare following surgery on the respiratory system: Secondary | ICD-10-CM | POA: Diagnosis not present

## 2017-07-16 DIAGNOSIS — N183 Chronic kidney disease, stage 3 (moderate): Secondary | ICD-10-CM | POA: Diagnosis not present

## 2017-07-16 DIAGNOSIS — L89312 Pressure ulcer of right buttock, stage 2: Secondary | ICD-10-CM | POA: Diagnosis not present

## 2017-07-20 ENCOUNTER — Ambulatory Visit: Payer: Medicare Other | Admitting: Hematology and Oncology

## 2017-07-21 DIAGNOSIS — Z853 Personal history of malignant neoplasm of breast: Secondary | ICD-10-CM | POA: Diagnosis not present

## 2017-07-21 DIAGNOSIS — Z87891 Personal history of nicotine dependence: Secondary | ICD-10-CM | POA: Diagnosis not present

## 2017-07-21 DIAGNOSIS — Z931 Gastrostomy status: Secondary | ICD-10-CM | POA: Diagnosis not present

## 2017-07-21 DIAGNOSIS — Z7901 Long term (current) use of anticoagulants: Secondary | ICD-10-CM | POA: Diagnosis not present

## 2017-07-21 DIAGNOSIS — J860 Pyothorax with fistula: Secondary | ICD-10-CM | POA: Diagnosis not present

## 2017-07-21 DIAGNOSIS — C8581 Other specified types of non-Hodgkin lymphoma, lymph nodes of head, face, and neck: Secondary | ICD-10-CM | POA: Diagnosis not present

## 2017-07-21 DIAGNOSIS — Z9010 Acquired absence of unspecified breast and nipple: Secondary | ICD-10-CM | POA: Diagnosis not present

## 2017-07-21 DIAGNOSIS — Z7189 Other specified counseling: Secondary | ICD-10-CM | POA: Diagnosis not present

## 2017-07-21 DIAGNOSIS — Z9221 Personal history of antineoplastic chemotherapy: Secondary | ICD-10-CM | POA: Diagnosis not present

## 2017-07-22 ENCOUNTER — Inpatient Hospital Stay: Payer: Medicare Other | Attending: Hematology and Oncology | Admitting: Hematology and Oncology

## 2017-07-22 ENCOUNTER — Telehealth: Payer: Self-pay | Admitting: Hematology and Oncology

## 2017-07-22 DIAGNOSIS — Z7901 Long term (current) use of anticoagulants: Secondary | ICD-10-CM | POA: Insufficient documentation

## 2017-07-22 DIAGNOSIS — C8331 Diffuse large B-cell lymphoma, lymph nodes of head, face, and neck: Secondary | ICD-10-CM | POA: Diagnosis not present

## 2017-07-22 DIAGNOSIS — Z853 Personal history of malignant neoplasm of breast: Secondary | ICD-10-CM | POA: Diagnosis not present

## 2017-07-22 DIAGNOSIS — Z452 Encounter for adjustment and management of vascular access device: Secondary | ICD-10-CM | POA: Diagnosis not present

## 2017-07-22 DIAGNOSIS — R1310 Dysphagia, unspecified: Secondary | ICD-10-CM | POA: Insufficient documentation

## 2017-07-22 DIAGNOSIS — I2699 Other pulmonary embolism without acute cor pulmonale: Secondary | ICD-10-CM | POA: Diagnosis not present

## 2017-07-22 DIAGNOSIS — Z923 Personal history of irradiation: Secondary | ICD-10-CM

## 2017-07-22 MED ORDER — RIVAROXABAN 20 MG PO TABS: 20.0000 mg | ORAL_TABLET | Freq: Every day | ORAL | 6 refills | Status: DC

## 2017-07-22 NOTE — Assessment & Plan Note (Signed)
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 positive for LCA, CD20, CD79a PAX-5, BCL-6 and weak cytoplasmic kappa associated with patchy weak positivity for BCL-2 and CD10, Ki-67 40%  Stage IIb(bilateral cervical, esophageal, mediastinal, supraclavicular nodes)  Bone marrow biopsy negative for diffuse large B cell lymphoma but suggestive of small low-grade B cell population possibly SLL/CLL  Revised IPI score: 1, good prognosis, redictated for years PFS 80%, predicted overall survival 79% ------------------------------------------------------------------------ Current treatment: R CHOPcyclecompleted 4 cycles   TE Fistula: Repaired Pulmonary embolism: Currently on Lovenox we can switch her to Xarelto  No clinical evidence of disease progression. We will plan a PET CT scan in 6 months and follow-up after that   

## 2017-07-22 NOTE — Telephone Encounter (Signed)
Gave patient AVs and calendar of upcoming April through august appointments

## 2017-07-22 NOTE — Progress Notes (Signed)
Patient Care Team: Marletta Lor, MD as PCP - General Marcy Panning, MD as Consulting Physician (Internal Medicine) Thea Silversmith, MD (Inactive) as Consulting Physician (Radiation Oncology) Nicholas Lose, MD as Consulting Physician (Hematology and Oncology)  DIAGNOSIS:  Encounter Diagnosis  Name Primary?  . 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 Radiation recall related to tamoxifen      11/25/2010 -  Anti-estrogen oral therapy    Aromasin 25 mg daily.myalgias and arthralgias stop November 2012 went back to tamoxifen 20 mg daily      01/06/2017 Pathology Results    Bone marrow biopsy: Slightly hypercellular bone marrow for age with granulocytic hyperplasia, numerous small lymphoid aggregates present. This may represent minimal involvement by low-grade B-cell lymphoma or liver process like SLL/CLL because they are CD5 positive       Diffuse large B cell lymphoma ([place])   12/26/2016 Initial Diagnosis    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  She had tracheoesophageal fistula which required a tube feedings and she is continuing to be on tube feeds.  She had surgical procedure on 06/02/2017 at [facility] to close the TE fistula.  This apparently led to a cutaneous fistula which was finally treated with wound VAC and it appears to be healing quite well.  There appears to be a small opening on the left side of the neck with granulation tissue.  Her voice has returned fairly well.  She is now able to swallow ice.  She is having difficulty swallowing liquids.  She is scheduled to undergo a barium swallow in May.  REVIEW OF SYSTEMS:   Constitutional: Denies fevers, chills or abnormal weight loss Eyes: Denies blurriness of vision Ears, nose, mouth, throat, and face: Denies mucositis or sore throat Respiratory: Denies cough, dyspnea or wheezes Cardiovascular: Denies palpitation, chest discomfort Gastrointestinal:  Denies nausea, heartburn or change in bowel habits Skin: Denies abnormal skin rashes Lymphatics: Denies new lymphadenopathy or easy bruising Neurological:Denies numbness, tingling or new weaknesses Behavioral/Psych: Mood is stable, no new changes  Extremities: No lower extremity edema  All other systems were reviewed with the patient and are negative.  I have reviewed the past medical history, past surgical history, social history and family history with the patient and they are unchanged  from previous note.  ALLERGIES:  has No Known Allergies.  MEDICATIONS:  Current Outpatient Medications  Medication Sig Dispense Refill  . furosemide (LASIX) 20 MG tablet Take 1 tablet (20 mg total) by mouth 2 (two) times daily. 180 tablet 1  . heparin lock flush 100  UNIT/ML SOLN injection Inject 5 mLs (500 Units total) into the vein once for 1 dose. 5 Syringe 0  . levothyroxine (SYNTHROID, LEVOTHROID) 50 MCG tablet Place 1 tablet (50 mcg total) into feeding tube daily.    Marland Kitchen loperamide (IMODIUM A-D) 2 MG tablet Place 2 mg into feeding tube 4 (four) times daily as needed for diarrhea or loose stools.    Marland Kitchen omeprazole (PRILOSEC) 20 MG capsule Take 20 mg daily by mouth.    . rivaroxaban (XARELTO) 20 MG TABS tablet Take 1 tablet (20 mg total) by mouth daily with supper. 30 tablet 6  . sodium chloride flush 0.9 % SOLN injection Inject 10 mLs into the vein as needed. 5 Syringe 0   No current facility-administered medications for this visit.     PHYSICAL EXAMINATION: ECOG PERFORMANCE STATUS: 1 - Symptomatic but completely ambulatory  Vitals:   07/22/17 1138  BP: (!) 153/64  Pulse: 82  Resp: 18  Temp: 98.4 F (36.9 C)  SpO2: 98%   Filed Weights   07/22/17 1138  Weight: 200 lb 3.2 oz (90.8 kg)    GENERAL:alert, no distress and comfortable SKIN: skin color, texture, turgor are normal, no rashes or significant lesions EYES: normal, Conjunctiva are pink and non-injected, sclera clear OROPHARYNX:no exudate, no erythema and lips, buccal mucosa, and tongue normal  NECK: supple, thyroid normal size, non-tender, without nodularity LYMPH:  no palpable lymphadenopathy in the cervical, axillary or inguinal LUNGS: clear to auscultation and percussion with normal breathing effort HEART: regular rate & rhythm and no murmurs and no lower extremity edema ABDOMEN:abdomen soft, non-tender and normal bowel sounds MUSCULOSKELETAL:no cyanosis of digits and no clubbing  NEURO: alert & oriented x 3 with fluent speech, no focal motor/sensory deficits EXTREMITIES: No lower extremity edema  LABORATORY DATA:  I have reviewed the data as listed CMP Latest Ref Rng & Units 05/01/2017 04/10/2017 03/20/2017  Glucose 70 - 140 mg/dL 103 126 107  BUN 7 - 26 mg/dL 19 15.9 8.4    Creatinine 0.60 - 1.10 mg/dL 0.85 0.8 0.8  Sodium 136 - 145 mmol/L 141 137 142  Potassium 3.3 - 4.7 mmol/L 4.5 3.8 3.6  Chloride 98 - 109 mmol/L 104 - -  CO2 22 - 29 mmol/L _0 Calcium 8.4 - 10.4 mg/dL 9.3 8.4 8.3(L)  Total Protein 6.4 - 8.3 g/dL 6.5 5.8(L) 5.6(L)  Total Bilirubin 0.2 - 1.2 mg/dL 0.5 0.44 0.54  Alkaline Phos 40 - 150 U/L 59 46 48  AST 5 - 34 U/L 24 35(H) 28  ALT 0 - 55 U/L 35 52 31    Lab Results  Component Value Date   WBC 7.7 05/01/2017   HGB 10.9 (L) 05/01/2017   HCT 33.5 (L) 05/01/2017   MCV 92.4 05/01/2017   PLT 359 05/01/2017   NEUTROABS 5.4 05/01/2017    ASSESSMENT & PLAN:  Diffuse large B cell lymphoma ([facility]) 12/26/2016 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 positive for LCA, CD20, CD79a PAX-5, BCL-6 and weak cytoplasmic kappa associated with patchy weak positivity for BCL-2 and CD10, Ki-67 40%  Stage IIb(bilateral cervical, esophageal, mediastinal, supraclavicular nodes)  Bone marrow biopsy negative for diffuse large B  cell lymphoma but suggestive of small low-grade B cell population possibly SLL/CLL  Revised IPI score: 1, good prognosis, redictated for years PFS 80%, predicted overall survival 79% ------------------------------------------------------------------------ Current treatment: R CHOPcyclecompleted 4 cycles   TE Fistula: Repaired Pulmonary embolism: switched her to Xarelto.  Plan to treat her until November 2019.  No clinical evidence of disease progression. We will plan a CT scan in August and follow-up after that After the scans we can remove the port.      Orders Placed This Encounter  Procedures  . CT Chest W Contrast    Standing Status:   Future    Standing Expiration Date:   07/22/2018    Order Specific Question:   If indicated for the ordered procedure, I authorize the administration of contrast media per Radiology protocol    Answer:   Yes    Order Specific Question:    Preferred imaging location?    Answer:   [facility]    Order Specific Question:   Radiology Contrast Protocol - do NOT remove file path    Answer:   \\[email or web address]    Order Specific Question:   Reason for Exam additional comments    Answer:   DLBCL lymphoma restaging  . CT Soft Tissue Neck W Contrast    Standing Status:   Future    Standing Expiration Date:   07/22/2018    Order Specific Question:   If indicated for the ordered procedure, I authorize the administration of contrast media per Radiology protocol    Answer:   Yes    Order Specific Question:   Preferred imaging location?    Answer:   [facility]    Order Specific Question:   Radiology Contrast Protocol - do NOT remove file path    Answer:   \\[email or web address]    Order Specific Question:   Reason for Exam additional comments    Answer:   DLBCL restaging  . CBC with Differential (Cancer Center Only)    Standing Status:   Future    Standing Expiration Date:   07/23/2018  . CMP ([facility] only)    Standing Status:   Future    Standing Expiration Date:   07/23/2018  . Lactate dehydrogenase (LDH)    Standing Status:   Future    Standing Expiration Date:   07/22/2018   The patient has a good understanding of the overall plan. she agrees with it. she will call with any problems that may develop before the next visit here.   Harriette Ohara, MD 07/22/17

## 2017-07-24 DIAGNOSIS — I129 Hypertensive chronic kidney disease with stage 1 through stage 4 chronic kidney disease, or unspecified chronic kidney disease: Secondary | ICD-10-CM | POA: Diagnosis not present

## 2017-07-24 DIAGNOSIS — Z431 Encounter for attention to gastrostomy: Secondary | ICD-10-CM | POA: Diagnosis not present

## 2017-07-24 DIAGNOSIS — N183 Chronic kidney disease, stage 3 (moderate): Secondary | ICD-10-CM | POA: Diagnosis not present

## 2017-07-24 DIAGNOSIS — Z48813 Encounter for surgical aftercare following surgery on the respiratory system: Secondary | ICD-10-CM | POA: Diagnosis not present

## 2017-07-24 DIAGNOSIS — L89322 Pressure ulcer of left buttock, stage 2: Secondary | ICD-10-CM | POA: Diagnosis not present

## 2017-07-24 DIAGNOSIS — L89312 Pressure ulcer of right buttock, stage 2: Secondary | ICD-10-CM | POA: Diagnosis not present

## 2017-07-28 DIAGNOSIS — Z431 Encounter for attention to gastrostomy: Secondary | ICD-10-CM | POA: Diagnosis not present

## 2017-07-28 DIAGNOSIS — L89322 Pressure ulcer of left buttock, stage 2: Secondary | ICD-10-CM | POA: Diagnosis not present

## 2017-07-28 DIAGNOSIS — Z48813 Encounter for surgical aftercare following surgery on the respiratory system: Secondary | ICD-10-CM | POA: Diagnosis not present

## 2017-07-28 DIAGNOSIS — I129 Hypertensive chronic kidney disease with stage 1 through stage 4 chronic kidney disease, or unspecified chronic kidney disease: Secondary | ICD-10-CM | POA: Diagnosis not present

## 2017-07-28 DIAGNOSIS — L89312 Pressure ulcer of right buttock, stage 2: Secondary | ICD-10-CM | POA: Diagnosis not present

## 2017-07-28 DIAGNOSIS — N183 Chronic kidney disease, stage 3 (moderate): Secondary | ICD-10-CM | POA: Diagnosis not present

## 2017-07-31 ENCOUNTER — Other Ambulatory Visit (HOSPITAL_COMMUNITY): Payer: Self-pay | Admitting: Otolaryngology

## 2017-07-31 DIAGNOSIS — R1310 Dysphagia, unspecified: Secondary | ICD-10-CM

## 2017-08-03 ENCOUNTER — Ambulatory Visit (HOSPITAL_COMMUNITY)
Admission: RE | Admit: 2017-08-03 | Discharge: 2017-08-03 | Disposition: A | Discharge status: Home / Self Care | Payer: Medicare Other | Source: Ambulatory Visit | Attending: Radiology | Admitting: Radiology

## 2017-08-03 ENCOUNTER — Encounter (HOSPITAL_COMMUNITY): Payer: Self-pay | Admitting: Radiology

## 2017-08-03 ENCOUNTER — Other Ambulatory Visit (HOSPITAL_COMMUNITY): Payer: Self-pay | Admitting: Radiology

## 2017-08-03 DIAGNOSIS — R633 Feeding difficulties, unspecified: Secondary | ICD-10-CM

## 2017-08-03 DIAGNOSIS — K9423 Gastrostomy malfunction: Secondary | ICD-10-CM | POA: Diagnosis not present

## 2017-08-03 DIAGNOSIS — R1310 Dysphagia, unspecified: Secondary | ICD-10-CM | POA: Diagnosis not present

## 2017-08-03 DIAGNOSIS — K222 Esophageal obstruction: Secondary | ICD-10-CM | POA: Diagnosis present

## 2017-08-03 HISTORY — PX: IR REPLACE G-TUBE SIMPLE WO FLUORO: IMG2323

## 2017-08-03 NOTE — Procedures (Signed)
[name] existing 38 French balloon retention gastrostomy tube was exchanged out for new 20 french balloon retention gastrostomy tube without immediate complications.  No apparent leaking noted.

## 2017-08-04 ENCOUNTER — Ambulatory Visit (HOSPITAL_COMMUNITY)
Admission: RE | Admit: 2017-08-04 | Discharge: 2017-08-04 | Disposition: A | Discharge status: Home / Self Care | Payer: Medicare Other | Source: Ambulatory Visit | Attending: Otolaryngology | Admitting: Otolaryngology

## 2017-08-04 ENCOUNTER — Ambulatory Visit: Payer: Medicare Other | Admitting: Hematology and Oncology

## 2017-08-04 DIAGNOSIS — R1310 Dysphagia, unspecified: Secondary | ICD-10-CM

## 2017-08-04 DIAGNOSIS — K9423 Gastrostomy malfunction: Secondary | ICD-10-CM | POA: Diagnosis not present

## 2017-08-04 DIAGNOSIS — R633 Feeding difficulties: Secondary | ICD-10-CM | POA: Diagnosis not present

## 2017-08-05 DIAGNOSIS — M25561 Pain in right knee: Secondary | ICD-10-CM | POA: Insufficient documentation

## 2017-08-06 DIAGNOSIS — L89322 Pressure ulcer of left buttock, stage 2: Secondary | ICD-10-CM | POA: Diagnosis not present

## 2017-08-06 DIAGNOSIS — Z48813 Encounter for surgical aftercare following surgery on the respiratory system: Secondary | ICD-10-CM | POA: Diagnosis not present

## 2017-08-06 DIAGNOSIS — L89312 Pressure ulcer of right buttock, stage 2: Secondary | ICD-10-CM | POA: Diagnosis not present

## 2017-08-06 DIAGNOSIS — I129 Hypertensive chronic kidney disease with stage 1 through stage 4 chronic kidney disease, or unspecified chronic kidney disease: Secondary | ICD-10-CM | POA: Diagnosis not present

## 2017-08-06 DIAGNOSIS — Z431 Encounter for attention to gastrostomy: Secondary | ICD-10-CM | POA: Diagnosis not present

## 2017-08-06 DIAGNOSIS — N183 Chronic kidney disease, stage 3 (moderate): Secondary | ICD-10-CM | POA: Diagnosis not present

## 2017-08-12 ENCOUNTER — Inpatient Hospital Stay: Payer: Medicare Other

## 2017-08-12 DIAGNOSIS — C50411 Malignant neoplasm of upper-outer quadrant of right female breast: Secondary | ICD-10-CM

## 2017-08-12 DIAGNOSIS — Z170 Estrogen receptor positive status [ER+]: Secondary | ICD-10-CM

## 2017-08-12 DIAGNOSIS — Z853 Personal history of malignant neoplasm of breast: Secondary | ICD-10-CM | POA: Diagnosis not present

## 2017-08-12 DIAGNOSIS — C8331 Diffuse large B-cell lymphoma, lymph nodes of head, face, and neck: Secondary | ICD-10-CM | POA: Diagnosis not present

## 2017-08-12 DIAGNOSIS — I2699 Other pulmonary embolism without acute cor pulmonale: Secondary | ICD-10-CM | POA: Diagnosis not present

## 2017-08-12 DIAGNOSIS — T80212S Local infection due to central venous catheter, sequela: Secondary | ICD-10-CM

## 2017-08-12 DIAGNOSIS — R1310 Dysphagia, unspecified: Secondary | ICD-10-CM | POA: Diagnosis not present

## 2017-08-12 DIAGNOSIS — Z95828 Presence of other vascular implants and grafts: Secondary | ICD-10-CM

## 2017-08-12 DIAGNOSIS — Z7901 Long term (current) use of anticoagulants: Secondary | ICD-10-CM | POA: Diagnosis not present

## 2017-08-12 DIAGNOSIS — Z452 Encounter for adjustment and management of vascular access device: Secondary | ICD-10-CM | POA: Diagnosis not present

## 2017-08-12 MED ORDER — HEPARIN SOD (PORK) LOCK FLUSH 100 UNIT/ML IV SOLN
500.0000 [IU] | Freq: Once | INTRAVENOUS | Status: AC
  Administered 2017-08-12: 500 [IU]
  Filled 2017-08-12: qty 5

## 2017-08-12 MED ORDER — SODIUM CHLORIDE 0.9% FLUSH
10.0000 mL | Freq: Once | INTRAVENOUS | Status: AC
  Administered 2017-08-12: 10 mL
  Filled 2017-08-12: qty 10

## 2017-08-12 NOTE — Patient Instructions (Signed)

## 2017-09-08 ENCOUNTER — Other Ambulatory Visit: Payer: Self-pay | Admitting: Internal Medicine

## 2017-09-08 DIAGNOSIS — R1319 Other dysphagia: Secondary | ICD-10-CM | POA: Diagnosis not present

## 2017-09-08 DIAGNOSIS — J3801 Paralysis of vocal cords and larynx, unilateral: Secondary | ICD-10-CM | POA: Diagnosis not present

## 2017-09-08 DIAGNOSIS — J3800 Paralysis of vocal cords and larynx, unspecified: Secondary | ICD-10-CM | POA: Diagnosis not present

## 2017-09-08 DIAGNOSIS — Z931 Gastrostomy status: Secondary | ICD-10-CM | POA: Diagnosis not present

## 2017-09-08 DIAGNOSIS — R633 Feeding difficulties: Secondary | ICD-10-CM | POA: Diagnosis not present

## 2017-09-08 DIAGNOSIS — K228 Other specified diseases of esophagus: Secondary | ICD-10-CM | POA: Diagnosis not present

## 2017-09-08 DIAGNOSIS — R1314 Dysphagia, pharyngoesophageal phase: Secondary | ICD-10-CM | POA: Diagnosis not present

## 2017-09-15 ENCOUNTER — Encounter: Payer: Self-pay | Admitting: Internal Medicine

## 2017-09-15 ENCOUNTER — Telehealth: Payer: Self-pay | Admitting: Internal Medicine

## 2017-09-15 ENCOUNTER — Ambulatory Visit (INDEPENDENT_AMBULATORY_CARE_PROVIDER_SITE_OTHER): Payer: Medicare Other | Admitting: Internal Medicine

## 2017-09-15 VITALS — BP 122/68 | HR 80 | Temp 98.6°F | Wt 196.0 lb

## 2017-09-15 DIAGNOSIS — I10 Essential (primary) hypertension: Secondary | ICD-10-CM

## 2017-09-15 DIAGNOSIS — E039 Hypothyroidism, unspecified: Secondary | ICD-10-CM

## 2017-09-15 DIAGNOSIS — R7302 Impaired glucose tolerance (oral): Secondary | ICD-10-CM

## 2017-09-15 DIAGNOSIS — N183 Chronic kidney disease, stage 3 unspecified: Secondary | ICD-10-CM

## 2017-09-15 DIAGNOSIS — C8330 Diffuse large B-cell lymphoma, unspecified site: Secondary | ICD-10-CM | POA: Diagnosis not present

## 2017-09-15 NOTE — Telephone Encounter (Signed)
Pt has stopped by the window stating that Dr. Raliegh Ip stated that he will ask Dr. Elease Hashimoto if he will accept him and his wife Ondrea as pts.  They have been pts for 24 years and he stated out with Dr. Arnoldo Morale and went to Dr. Raliegh Ip.  They would like to stay here at this practice and wife(Marsena) has seen Dr. Elease Hashimoto once last year when Dr. Raliegh Ip was out.  Okay to leave msg on machine.

## 2017-09-15 NOTE — Patient Instructions (Signed)
Oncology follow-up as scheduled  ENT follow-up as scheduled  Return in 6 months for annual preventive health exam and flu vaccine

## 2017-09-15 NOTE — Progress Notes (Signed)
Subjective:    Patient ID: Kristen Escobar, female    DOB: [birth year], 72 y.o.   MRN: [id number]  HPI  72 year old patient who has a very complex oncological history.  This includes a history of breast cancer involving the right upper outer quadrant diagnosed in December 2011.  This was treated with lumpectomy, radiation therapy as well as oral anti-estrogen therapy.  The patient was diagnosed with a high-grade diffuse large B-cell lymphoma initially diagnosed on a thyroid biopsy.  The patient is status post chemotherapy and had a tracheoesophageal fistula and pulmonary embolism.  The T-E fistula was repaired at [facility] and was complicated by a tracheocutaneous fistula which was treated with a wound VAC.  Presently the patient is still n.p.o., status post PEG. Next month she is scheduled for endoscopy and hopefully successful esophageal dilatation at [facility].  Complaints today include numbness involving her toes for approximately 4 weeks.  Wt Readings from Last 3 Encounters:  09/15/17 196 lb (88.9 kg)  07/22/17 200 lb 3.2 oz (90.8 kg)  05/22/17 200 lb 6.4 oz (90.9 kg)    Past Medical History:  Diagnosis Date  . Acute respiratory failure with hypoxia ([place]) 02/16/2017  . Arthritis   . Cancer ([place]) 03/2009   breast- rt  . Diffuse large B cell lymphoma ([place]) dx'd 02/17/17  . GERD (gastroesophageal reflux disease)   . History of radiation therapy 07/12/10,completed   right breast 60 Gy x30 fx  . Hypertension   . Hypothyroidism   . Obesity   . Peripheral vascular disease ([place]) 1995   PT DEVELOPED CIRCULATION PROBLEMS IN BOTH HANDS AND GANGRENE OF BOTH INDEX FINGERS--REQUIRING AMPUTATION OF THE INDEX FINGERS AND VASCULAR SURGERY.  PT TOLD HER PROBLEMS RELATED TO SMOKING.   NO OTHER PROBLEMS SINCE.  Marland Kitchen Pneumonia   . Thyroid mass      Social History   Socioeconomic History  . Marital status: Married    Spouse name: Not on file  . Number of children: Not on  file  . Years of education: Not on file  . Highest education level: Not on file  Occupational History  . Not on file  Social Needs  . Financial resource strain: Not on file  . Food insecurity:    Worry: Not on file    Inability: Not on file  . Transportation needs:    Medical: Not on file    Non-medical: Not on file  Tobacco Use  . Smoking status: Former Smoker    Packs/day: 1.00    Years: 20.00    Pack years: 20.00    Last attempt to quit: 04/21/1993    Years since quitting: 24.4  . Smokeless tobacco: Never Used  Substance and Sexual Activity  . Alcohol use: Yes    Comment: OCCAS - MAYBE ONCE A MONTH  . Drug use: No  . Sexual activity: Yes  Lifestyle  . Physical activity:    Days per week: Not on file    Minutes per session: Not on file  . Stress: Not on file  Relationships  . Social connections:    Talks on phone: Not on file    Gets together: Not on file    Attends religious service: Not on file    Active member of club or organization: Not on file    Attends meetings of clubs or organizations: Not on file    Relationship status: Not on file  . Intimate partner violence:    Fear of  current or ex partner: Not on file    Emotionally abused: Not on file    Physically abused: Not on file    Forced sexual activity: Not on file  Other Topics Concern  . Not on file  Social History Narrative  . Not on file    Past Surgical History:  Procedure Laterality Date  . AMPUTATION     partial amputation of both index fingers  . BREAST SURGERY  2011   lumpectomy with node sampling- RIGHT  . COLONOSCOPY    . ESOPHAGOGASTRODUODENOSCOPY    . EXCISION MASS NECK Left 12/26/2016   Procedure: EXCISION MASS NECK;  Surgeon: Izora Gala, MD;  Location: [facility];  Service: ENT;  Laterality: Left;  open excision of thyroid mass left side with frozen section  . HAND SURGERY Bilateral 1995   Amputaed pointer fingers bilaterally  . IR FLUORO GUIDE PORT INSERTION RIGHT  01/14/2017  . IR  GASTROSTOMY TUBE MOD SED  02/19/2017  . IR REPLACE G-TUBE SIMPLE WO FLUORO  08/03/2017  . IR REPLC GASTRO/COLONIC TUBE PERCUT W/FLUORO  02/25/2017  . IR [place] TUBE PERCUT W/FLUORO  03/09/2017  . IR US GUIDE VASC ACCESS RIGHT  01/14/2017  . TOTAL HIP ARTHROPLASTY  12/16/2011   right hip  . TOTAL HIP ARTHROPLASTY  01/20/2012   Procedure: TOTAL HIP ARTHROPLASTY ANTERIOR APPROACH;  Surgeon: Mauri Pole, MD;  Location: [facility];  Service: Orthopedics;  Laterality: Left;  . TUBAL LIGATION    . VASCULAR SURGERY     both hands    Family History  Problem Relation Age of Onset  . Cancer Mother        pt unaware of what kind  . Hearing loss Mother   . Coronary artery disease Father   . Diabetes Father   . Coronary artery disease Brother   . Coronary artery disease Brother   . Diabetes Sister   . Cancer Sister        breast  . Colon cancer Neg Hx   . Stomach cancer Neg Hx   . Rectal cancer Neg Hx   . Esophageal cancer Neg Hx     No Known Allergies  Current Outpatient Medications on File Prior to Visit  Medication Sig Dispense Refill  . furosemide (LASIX) 20 MG tablet Take 1 tablet (20 mg total) by mouth 2 (two) times daily. 180 tablet 1  . levothyroxine (SYNTHROID, LEVOTHROID) 50 MCG tablet Place 1 tablet (50 mcg total) into feeding tube daily.    Marland Kitchen loperamide (IMODIUM A-D) 2 MG tablet Place 2 mg into feeding tube 4 (four) times daily as needed for diarrhea or loose stools.    Marland Kitchen omeprazole (PRILOSEC) 20 MG capsule Take 20 mg daily by mouth.    . rivaroxaban (XARELTO) 20 MG TABS tablet Take 1 tablet (20 mg total) by mouth daily with supper. 30 tablet 6  . sodium chloride flush 0.9 % SOLN injection Inject 10 mLs into the vein as needed. 5 Syringe 0  . heparin lock flush 100 UNIT/ML SOLN injection Inject 5 mLs (500 Units total) into the vein once for 1 dose. 5 Syringe 0   No current facility-administered medications on file prior to visit.     BP 122/68 (BP Location: Left  Wrist, Patient Position: Sitting, Cuff Size: Normal)   Pulse 80   Temp 98.6 F (37 C) (Oral)   Wt 196 lb (88.9 kg)   SpO2 97%   BMI 35.85 kg/m  Review of Systems  Constitutional: Negative.   Gastrointestinal: Negative.   Neurological: Positive for weakness and numbness.       Objective:   Physical Exam  Constitutional: She is oriented to person, place, and time. She appears well-developed and well-nourished.  Weight 196 Blood pressure 130/72  HENT:  Head: Normocephalic.  Right Ear: External ear normal.  Left Ear: External ear normal.  Mouth/Throat: Oropharynx is clear and moist.  Eyes: Pupils are equal, round, and reactive to light. Conjunctivae and EOM are normal.  Neck: Normal range of motion. Neck supple. No thyromegaly present.  Cardiovascular: Normal rate, regular rhythm, normal heart sounds and intact distal pulses.  Pulmonary/Chest: Effort normal and breath sounds normal.  Abdominal: Soft. Bowel sounds are normal. She exhibits no mass. There is no tenderness.  Status post PEG  Musculoskeletal: Normal range of motion.  Lymphadenopathy:    She has no cervical adenopathy.  Neurological: She is alert and oriented to person, place, and time. A sensory deficit is present.  Decreased vibratory sensation of the feet and ankles Monofilament testing of the toes normal  No motor weakness  No ischemic changes  Skin: Skin is warm and dry. No rash noted.  Psychiatric: She has a normal mood and affect. Her behavior is normal.          Assessment & Plan:   Paresthesias of the toes.  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  Symptoms have been present for only 4 weeks will observe at the present time the patient did state that at the onset of her symptoms she did have significant pedal edema that has resolved History of diffuse large cell lymphoma.  PET scan Pending  Status post T-E fistula/esophageal stenosis.  EGD and esophageal dilatation plan for  next month  Oncology follow-up as scheduled  Nyoka Cowden

## 2017-09-15 NOTE — Telephone Encounter (Signed)
OK 

## 2017-09-16 NOTE — Telephone Encounter (Signed)
Called and spoke with pt spouse and he is aware of the msg from Dr. Elease Hashimoto that he will accept him and his wife as pts.  Pt state that he will call back when ready to make an establish visit.

## 2017-09-23 ENCOUNTER — Other Ambulatory Visit: Payer: Self-pay | Admitting: Hematology and Oncology

## 2017-09-23 ENCOUNTER — Inpatient Hospital Stay: Payer: Medicare Other | Attending: Hematology and Oncology

## 2017-09-23 ENCOUNTER — Telehealth: Payer: Self-pay

## 2017-09-23 VITALS — BP 127/57 | HR 64 | Temp 98.9°F | Resp 18

## 2017-09-23 DIAGNOSIS — Z170 Estrogen receptor positive status [ER+]: Secondary | ICD-10-CM

## 2017-09-23 DIAGNOSIS — C8331 Diffuse large B-cell lymphoma, lymph nodes of head, face, and neck: Secondary | ICD-10-CM | POA: Insufficient documentation

## 2017-09-23 DIAGNOSIS — Z452 Encounter for adjustment and management of vascular access device: Secondary | ICD-10-CM | POA: Diagnosis not present

## 2017-09-23 DIAGNOSIS — Z95828 Presence of other vascular implants and grafts: Secondary | ICD-10-CM

## 2017-09-23 DIAGNOSIS — T80212S Local infection due to central venous catheter, sequela: Secondary | ICD-10-CM

## 2017-09-23 DIAGNOSIS — C50411 Malignant neoplasm of upper-outer quadrant of right female breast: Secondary | ICD-10-CM

## 2017-09-23 MED ORDER — SODIUM CHLORIDE 0.9% FLUSH
10.0000 mL | Freq: Once | INTRAVENOUS | Status: AC
  Administered 2017-09-23: 10 mL
  Filled 2017-09-23: qty 10

## 2017-09-23 MED ORDER — HEPARIN SOD (PORK) LOCK FLUSH 100 UNIT/ML IV SOLN
500.0000 [IU] | Freq: Once | INTRAVENOUS | Status: AC
  Administered 2017-09-23: 500 [IU]
  Filled 2017-09-23: qty 5

## 2017-09-23 NOTE — Telephone Encounter (Signed)
Per Dr Lindi Adie, he is canceling the CTs and ordering PET scan instead.  I have notified the patient, awaiting managed care.  She will need a lab and port flush and the PET scan done like 8/1 (similar to way CTs were previously scheduled) then appt with Dr Lindi Adie on 8/2 to discuss results. I will send inbasket to managed care and leave note for Dr Lindi Adie' s nurse to follow up so this can be scheduled around 8/1. Pt voiced understanding.

## 2017-09-23 NOTE — Patient Instructions (Signed)
Implanted Port Home Guide An implanted port is a type of central line that is placed under the skin. Central lines are used to provide IV access when treatment or nutrition needs to be given through a person's veins. Implanted ports are used for long-term IV access. An implanted port may be placed because:  You need IV medicine that would be irritating to the small veins in your hands or arms.  You need long-term IV medicines, such as antibiotics.  You need IV nutrition for a long period.  You need frequent blood draws for lab tests.  You need dialysis.  Implanted ports are usually placed in the chest area, but they can also be placed in the upper arm, the abdomen, or the leg. An implanted port has two main parts:  Reservoir. The reservoir is round and will appear as a small, raised area under your skin. The reservoir is the part where a needle is inserted to give medicines or draw blood.  Catheter. The catheter is a thin, flexible tube that extends from the reservoir. The catheter is placed into a large vein. Medicine that is inserted into the reservoir goes into the catheter and then into the vein.  How will I care for my incision site? Do not get the incision site wet. Bathe or shower as directed by your health care provider. How is my port accessed? Special steps must be taken to access the port:  Before the port is accessed, a numbing cream can be placed on the skin. This helps numb the skin over the port site.  Your health care provider uses a sterile technique to access the port. ? Your health care provider must put on a mask and sterile gloves. ? The skin over your port is cleaned carefully with an antiseptic and allowed to dry. ? The port is gently pinched between sterile gloves, and a needle is inserted into the port.  Only "non-coring" port needles should be used to access the port. Once the port is accessed, a blood return should be checked. This helps ensure that the port  is in the vein and is not clogged.  If your port needs to remain accessed for a constant infusion, a clear (transparent) bandage will be placed over the needle site. The bandage and needle will need to be changed every week, or as directed by your health care provider.  Keep the bandage covering the needle clean and dry. Do not get it wet. Follow your health care provider's instructions on how to take a shower or bath while the port is accessed.  If your port does not need to stay accessed, no bandage is needed over the port.  What is flushing? Flushing helps keep the port from getting clogged. Follow your health care provider's instructions on how and when to flush the port. Ports are usually flushed with saline solution or a medicine called heparin. The need for flushing will depend on how the port is used.  If the port is used for intermittent medicines or blood draws, the port will need to be flushed: ? After medicines have been given. ? After blood has been drawn. ? As part of routine maintenance.  If a constant infusion is running, the port may not need to be flushed.  How long will my port stay implanted? The port can stay in for as long as your health care provider thinks it is needed. When it is time for the port to come out, surgery will be   done to remove it. The procedure is similar to the one performed when the port was put in. When should I seek immediate medical care? When you have an implanted port, you should seek immediate medical care if:  You notice a bad smell coming from the incision site.  You have swelling, redness, or drainage at the incision site.  You have more swelling or pain at the port site or the surrounding area.  You have a fever that is not controlled with medicine.  This information is not intended to replace advice given to you by your health care provider. Make sure you discuss any questions you have with your health care provider. Document  Released: 04/07/2005 Document Revised: 09/13/2015 Document Reviewed: 12/13/2012 Elsevier Interactive Patient Education  2017 Elsevier Inc.  

## 2017-10-06 DIAGNOSIS — K222 Esophageal obstruction: Secondary | ICD-10-CM | POA: Diagnosis not present

## 2017-10-06 DIAGNOSIS — R1314 Dysphagia, pharyngoesophageal phase: Secondary | ICD-10-CM | POA: Diagnosis not present

## 2017-10-06 DIAGNOSIS — Z931 Gastrostomy status: Secondary | ICD-10-CM | POA: Diagnosis not present

## 2017-10-06 DIAGNOSIS — R1310 Dysphagia, unspecified: Secondary | ICD-10-CM | POA: Diagnosis not present

## 2017-10-06 DIAGNOSIS — K228 Other specified diseases of esophagus: Secondary | ICD-10-CM | POA: Diagnosis not present

## 2017-10-16 ENCOUNTER — Telehealth: Payer: Self-pay

## 2017-10-16 NOTE — Telephone Encounter (Signed)
Pt's husband called (we have permission to speak with him), said on speaker phone to allow pt to hear.  He reports pt is having surgery on the same day as the PET scan was ordered which was 11-19-2017.  He would like the appts for PET scan, lab, and MD visit rescheduled for earlier that week instead of the end of that week due to the surgery.  Rescheduled as PET scan on 7-29 8 am and lab 9:45a /MD 10:15a visit on 7-30.  Reminded him to keep patient's appt for flush scheduled for 7-17. Instructed on NPO after MN (she has feeding tube and so he said he will not give feeding after MN, only her morning meds crushed with little water that morning- let scheduling be aware for PET scan that she has a feeding tube.)  Pt's husband voiced understanding and no other needs per him at this time.

## 2017-11-02 DIAGNOSIS — M1711 Unilateral primary osteoarthritis, right knee: Secondary | ICD-10-CM | POA: Diagnosis not present

## 2017-11-02 DIAGNOSIS — M25561 Pain in right knee: Secondary | ICD-10-CM | POA: Diagnosis not present

## 2017-11-04 ENCOUNTER — Inpatient Hospital Stay: Payer: Medicare Other | Attending: Hematology and Oncology

## 2017-11-04 DIAGNOSIS — Z95828 Presence of other vascular implants and grafts: Secondary | ICD-10-CM

## 2017-11-04 DIAGNOSIS — Z452 Encounter for adjustment and management of vascular access device: Secondary | ICD-10-CM | POA: Insufficient documentation

## 2017-11-04 DIAGNOSIS — C8330 Diffuse large B-cell lymphoma, unspecified site: Secondary | ICD-10-CM | POA: Diagnosis not present

## 2017-11-04 DIAGNOSIS — Z923 Personal history of irradiation: Secondary | ICD-10-CM | POA: Insufficient documentation

## 2017-11-04 DIAGNOSIS — Z170 Estrogen receptor positive status [ER+]: Secondary | ICD-10-CM | POA: Diagnosis not present

## 2017-11-04 DIAGNOSIS — Z79810 Long term (current) use of selective estrogen receptor modulators (SERMs): Secondary | ICD-10-CM | POA: Insufficient documentation

## 2017-11-04 DIAGNOSIS — C50411 Malignant neoplasm of upper-outer quadrant of right female breast: Secondary | ICD-10-CM | POA: Insufficient documentation

## 2017-11-04 DIAGNOSIS — T80212S Local infection due to central venous catheter, sequela: Secondary | ICD-10-CM

## 2017-11-04 MED ORDER — SODIUM CHLORIDE 0.9% FLUSH
10.0000 mL | Freq: Once | INTRAVENOUS | Status: AC
  Administered 2017-11-04: 10 mL
  Filled 2017-11-04: qty 10

## 2017-11-04 MED ORDER — HEPARIN SOD (PORK) LOCK FLUSH 100 UNIT/ML IV SOLN
500.0000 [IU] | Freq: Once | INTRAVENOUS | Status: AC
  Administered 2017-11-04: 500 [IU]
  Filled 2017-11-04: qty 5

## 2017-11-04 NOTE — Patient Instructions (Signed)
Implanted Port Home Guide An implanted port is a type of central line that is placed under the skin. Central lines are used to provide IV access when treatment or nutrition needs to be given through a person's veins. Implanted ports are used for long-term IV access. An implanted port may be placed because:  You need IV medicine that would be irritating to the small veins in your hands or arms.  You need long-term IV medicines, such as antibiotics.  You need IV nutrition for a long period.  You need frequent blood draws for lab tests.  You need dialysis.  Implanted ports are usually placed in the chest area, but they can also be placed in the upper arm, the abdomen, or the leg. An implanted port has two main parts:  Reservoir. The reservoir is round and will appear as a small, raised area under your skin. The reservoir is the part where a needle is inserted to give medicines or draw blood.  Catheter. The catheter is a thin, flexible tube that extends from the reservoir. The catheter is placed into a large vein. Medicine that is inserted into the reservoir goes into the catheter and then into the vein.  How will I care for my incision site? Do not get the incision site wet. Bathe or shower as directed by your health care provider. How is my port accessed? Special steps must be taken to access the port:  Before the port is accessed, a numbing cream can be placed on the skin. This helps numb the skin over the port site.  Your health care provider uses a sterile technique to access the port. ? Your health care provider must put on a mask and sterile gloves. ? The skin over your port is cleaned carefully with an antiseptic and allowed to dry. ? The port is gently pinched between sterile gloves, and a needle is inserted into the port.  Only "non-coring" port needles should be used to access the port. Once the port is accessed, a blood return should be checked. This helps ensure that the port  is in the vein and is not clogged.  If your port needs to remain accessed for a constant infusion, a clear (transparent) bandage will be placed over the needle site. The bandage and needle will need to be changed every week, or as directed by your health care provider.  Keep the bandage covering the needle clean and dry. Do not get it wet. Follow your health care provider's instructions on how to take a shower or bath while the port is accessed.  If your port does not need to stay accessed, no bandage is needed over the port.  What is flushing? Flushing helps keep the port from getting clogged. Follow your health care provider's instructions on how and when to flush the port. Ports are usually flushed with saline solution or a medicine called heparin. The need for flushing will depend on how the port is used.  If the port is used for intermittent medicines or blood draws, the port will need to be flushed: ? After medicines have been given. ? After blood has been drawn. ? As part of routine maintenance.  If a constant infusion is running, the port may not need to be flushed.  How long will my port stay implanted? The port can stay in for as long as your health care provider thinks it is needed. When it is time for the port to come out, surgery will be   done to remove it. The procedure is similar to the one performed when the port was put in. When should I seek immediate medical care? When you have an implanted port, you should seek immediate medical care if:  You notice a bad smell coming from the incision site.  You have swelling, redness, or drainage at the incision site.  You have more swelling or pain at the port site or the surrounding area.  You have a fever that is not controlled with medicine.  This information is not intended to replace advice given to you by your health care provider. Make sure you discuss any questions you have with your health care provider. Document  Released: 04/07/2005 Document Revised: 09/13/2015 Document Reviewed: 12/13/2012 Elsevier Interactive Patient Education  2017 Elsevier Inc.  

## 2017-11-16 ENCOUNTER — Encounter (HOSPITAL_COMMUNITY)
Admission: RE | Admit: 2017-11-16 | Discharge: 2017-11-16 | Disposition: A | Discharge status: Home / Self Care | Payer: Medicare Other | Source: Ambulatory Visit | Attending: Hematology and Oncology | Admitting: Hematology and Oncology

## 2017-11-16 DIAGNOSIS — C50411 Malignant neoplasm of upper-outer quadrant of right female breast: Secondary | ICD-10-CM | POA: Diagnosis not present

## 2017-11-16 DIAGNOSIS — Z170 Estrogen receptor positive status [ER+]: Secondary | ICD-10-CM | POA: Diagnosis not present

## 2017-11-16 DIAGNOSIS — C8330 Diffuse large B-cell lymphoma, unspecified site: Secondary | ICD-10-CM | POA: Diagnosis not present

## 2017-11-16 LAB — GLUCOSE, CAPILLARY: Glucose-Capillary: 111 mg/dL — ABNORMAL HIGH (ref 70–99)

## 2017-11-16 MED ORDER — FLUDEOXYGLUCOSE F - 18 (FDG) INJECTION
9.8700 | Freq: Once | INTRAVENOUS | Status: AC | PRN
  Administered 2017-11-16: 9.87 via INTRAVENOUS

## 2017-11-17 ENCOUNTER — Inpatient Hospital Stay: Payer: Medicare Other

## 2017-11-17 ENCOUNTER — Telehealth: Payer: Self-pay | Admitting: Hematology and Oncology

## 2017-11-17 ENCOUNTER — Other Ambulatory Visit: Payer: Medicare Other

## 2017-11-17 ENCOUNTER — Inpatient Hospital Stay (HOSPITAL_BASED_OUTPATIENT_CLINIC_OR_DEPARTMENT_OTHER): Payer: Medicare Other | Admitting: Hematology and Oncology

## 2017-11-17 VITALS — BP 135/74 | HR 66 | Temp 97.9°F | Resp 18 | Ht 62.0 in | Wt 194.5 lb

## 2017-11-17 DIAGNOSIS — C8330 Diffuse large B-cell lymphoma, unspecified site: Secondary | ICD-10-CM

## 2017-11-17 DIAGNOSIS — Z923 Personal history of irradiation: Secondary | ICD-10-CM

## 2017-11-17 DIAGNOSIS — Z79810 Long term (current) use of selective estrogen receptor modulators (SERMs): Secondary | ICD-10-CM | POA: Diagnosis not present

## 2017-11-17 DIAGNOSIS — C50411 Malignant neoplasm of upper-outer quadrant of right female breast: Secondary | ICD-10-CM | POA: Diagnosis not present

## 2017-11-17 DIAGNOSIS — Z452 Encounter for adjustment and management of vascular access device: Secondary | ICD-10-CM | POA: Diagnosis not present

## 2017-11-17 DIAGNOSIS — C8331 Diffuse large B-cell lymphoma, lymph nodes of head, face, and neck: Secondary | ICD-10-CM

## 2017-11-17 DIAGNOSIS — Z170 Estrogen receptor positive status [ER+]: Secondary | ICD-10-CM

## 2017-11-17 LAB — CBC WITH DIFFERENTIAL/PLATELET
Basophils Absolute: 0 10*3/uL (ref 0.0–0.1)
Basophils Relative: 0 %
Eosinophils Absolute: 0.3 10*3/uL (ref 0.0–0.5)
Eosinophils Relative: 3 %
HEMATOCRIT: 40.6 % (ref 34.8–46.6)
HEMOGLOBIN: 13 g/dL (ref 11.6–15.9)
LYMPHS PCT: 16 %
Lymphs Abs: 1.4 10*3/uL (ref 0.9–3.3)
MCH: 27.7 pg (ref 25.1–34.0)
MCHC: 32 g/dL (ref 31.5–36.0)
MCV: 86.6 fL (ref 79.5–101.0)
MONOS PCT: 10 %
Monocytes Absolute: 0.8 10*3/uL (ref 0.1–0.9)
NEUTROS ABS: 5.9 10*3/uL (ref 1.5–6.5)
NEUTROS PCT: 71 %
Platelets: 209 10*3/uL (ref 145–400)
RBC: 4.69 MIL/uL (ref 3.70–5.45)
RDW: 16.4 % — ABNORMAL HIGH (ref 11.2–14.5)
WBC: 8.3 10*3/uL (ref 3.9–10.3)

## 2017-11-17 LAB — COMPREHENSIVE METABOLIC PANEL
ALT: 15 U/L (ref 0–44)
ANION GAP: 8 (ref 5–15)
AST: 21 U/L (ref 15–41)
Albumin: 3.6 g/dL (ref 3.5–5.0)
Alkaline Phosphatase: 112 U/L (ref 38–126)
BUN: 31 mg/dL — ABNORMAL HIGH (ref 8–23)
CHLORIDE: 105 mmol/L (ref 98–111)
CO2: 29 mmol/L (ref 22–32)
Calcium: 9.9 mg/dL (ref 8.9–10.3)
Creatinine, Ser: 0.99 mg/dL (ref 0.44–1.00)
GFR calc non Af Amer: 56 mL/min — ABNORMAL LOW (ref >60)
Glucose, Bld: 104 mg/dL — ABNORMAL HIGH (ref 70–99)
POTASSIUM: 5.2 mmol/L — AB (ref 3.5–5.1)
SODIUM: 142 mmol/L (ref 135–145)
Total Bilirubin: 0.6 mg/dL (ref 0.3–1.2)
Total Protein: 7.4 g/dL (ref 6.5–8.1)

## 2017-11-17 LAB — LACTATE DEHYDROGENASE: LDH: 181 U/L (ref 98–192)

## 2017-11-17 NOTE — Telephone Encounter (Signed)
Gave patient avs and calendar of upcoming jan appts. °

## 2017-11-17 NOTE — Progress Notes (Signed)
Patient Care Team: Marletta Lor, MD as PCP - General Marcy Panning, MD as Consulting Physician (Internal Medicine) Thea Silversmith, MD as Consulting Physician (Radiation Oncology) Nicholas Lose, MD as Consulting Physician (Hematology and Oncology)  DIAGNOSIS:  Encounter Diagnoses  Name Primary?  . Diffuse large B-cell lymphoma, unspecified body region ([place])   . 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 Radiation recall related to tamoxifen      11/25/2010 -  Anti-estrogen oral therapy    Aromasin 25 mg daily.myalgias and arthralgias stop November 2012 went back to tamoxifen 20 mg daily      01/06/2017 Pathology Results    Bone marrow biopsy: Slightly hypercellular bone marrow for age with granulocytic hyperplasia, numerous small lymphoid aggregates present. This may represent minimal involvement by low-grade B-cell lymphoma or liver process like SLL/CLL because they are CD5 positive       Diffuse large B cell lymphoma ([place])   12/26/2016 Initial Diagnosis    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 positive for LCA, CD20, CD79a PAX-5, BCL-6 and weak cytoplasmic kappa associated with patchy weak positivity for BCL-2 and CD10, Ki-67 40%      01/16/2017 - 04/10/2017 Chemotherapy    R CHOP 5 cycles  Patient had tracheoesophageal fistula and pulmonary embolism      06/02/2017 Surgery   Repair of tracheoesophageal fistula led to tracheocutaneous fistula which was treated with wound VAC at [facility] COMPLIANT: Follow-up to review the results of that CT scan  INTERVAL HISTORY: Kristen Escobar is a 72 year old with above-mentioned history of breast cancer and lymphoma who underwent a PET CT scan recently and is here today to discuss results.  She is still on tube feeds and the gastroenterology is working on dilating her esophagus.  She has a tracheoesophageal fistula which appears to be getting better slowly over time.  Her voice has improved significantly.  She is not as hoarse anymore.  REVIEW OF SYSTEMS:   Constitutional: Denies fevers, chills or abnormal weight loss Eyes: Denies blurriness of vision Ears, nose, mouth, throat, and face: Denies mucositis or sore throat Respiratory: Denies cough, dyspnea or wheezes Cardiovascular: Denies palpitation, chest discomfort Gastrointestinal: PEG tube feeds, esophageal dilatation Skin: Denies abnormal skin rashes Lymphatics: Denies new lymphadenopathy or easy bruising Neurological:Denies numbness, tingling or new weaknesses Behavioral/Psych: Mood is stable, no new changes  Extremities: No lower extremity edema Breast:  denies any pain or lumps or nodules in either breasts All other systems were reviewed with the patient and are negative.  I have reviewed the past medical history, past surgical history, social history and family history with the patient and they are unchanged from previous note.  ALLERGIES:  has No Known Allergies.  MEDICATIONS:  Current Outpatient Medications  Medication Sig Dispense Refill  . furosemide (LASIX) 20 MG tablet Take 1 tablet (20 mg total) by mouth  2 (two) times daily. 180 tablet 1  . heparin lock flush 100 UNIT/ML SOLN injection Inject 5 mLs (500 Units total) into the vein once for 1 dose. 5 Syringe 0  . levothyroxine (SYNTHROID, LEVOTHROID) 50 MCG tablet Place 1 tablet  (50 mcg total) into feeding tube daily.    Marland Kitchen loperamide (IMODIUM A-D) 2 MG tablet Place 2 mg into feeding tube 4 (four) times daily as needed for diarrhea or loose stools.    Marland Kitchen omeprazole (PRILOSEC) 20 MG capsule Take 20 mg daily by mouth.    . rivaroxaban (XARELTO) 20 MG TABS tablet Take 1 tablet (20 mg total) by mouth daily with supper. 30 tablet 6  . sodium chloride flush 0.9 % SOLN injection Inject 10 mLs into the vein as needed. 5 Syringe 0   No current facility-administered medications for this visit.     PHYSICAL EXAMINATION: ECOG PERFORMANCE STATUS: 1 - Symptomatic but completely ambulatory  Vitals:   11/17/17 1015  BP: 135/74  Pulse: 66  Resp: 18  Temp: 97.9 F (36.6 C)  SpO2: 100%   Filed Weights   11/17/17 1015  Weight: 194 lb 8 oz (88.2 kg)    GENERAL:alert, no distress and comfortable SKIN: skin color, texture, turgor are normal, no rashes or significant lesions EYES: normal, Conjunctiva are pink and non-injected, sclera clear OROPHARYNX:no exudate, no erythema and lips, buccal mucosa, and tongue normal  NECK: supple, thyroid normal size, non-tender, without nodularity LYMPH:  no palpable lymphadenopathy in the cervical, axillary or inguinal LUNGS: clear to auscultation and percussion with normal breathing effort HEART: regular rate & rhythm and no murmurs and no lower extremity edema ABDOMEN: PEG tube MUSCULOSKELETAL:no cyanosis of digits and no clubbing  NEURO: alert & oriented x 3 with fluent speech, no focal motor/sensory deficits EXTREMITIES: No lower extremity edema    LABORATORY DATA:  I have reviewed the data as listed CMP Latest Ref Rng & Units 11/17/2017 05/01/2017 04/10/2017  Glucose 70 - 99 mg/dL 104(H) 103 126  BUN 8 - 23 mg/dL 31(H) 19 15.9  Creatinine 0.44 - 1.00 mg/dL 0.99 0.85 0.8  Sodium 135 - 145 mmol/L 142 141 137  Potassium 3.5 - 5.1 mmol/L 5.2(H) 4.5 3.8  Chloride 98 - 111 mmol/L 105 104 -  CO2 22 - 32 mmol/L '29 27 25  '$ Calcium 8.9 -  10.3 mg/dL 9.9 9.3 8.4  Total Protein 6.5 - 8.1 g/dL 7.4 6.5 5.8(L)  Total Bilirubin 0.3 - 1.2 mg/dL 0.6 0.5 0.44  Alkaline Phos 38 - 126 U/L 112 59 46  AST 15 - 41 U/L 21 24 35(H)  ALT 0 - 44 U/L 15 35 52    Lab Results  Component Value Date   WBC 8.3 11/17/2017   HGB 13.0 11/17/2017   HCT 40.6 11/17/2017   MCV 86.6 11/17/2017   PLT 209 11/17/2017   NEUTROABS 5.9 11/17/2017    ASSESSMENT & PLAN:  Diffuse large B cell lymphoma ([facility]) 12/26/2016 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  They will slowly improve over time. Pulmonary embolism: switched her to Xarelto.  Plan to treat her until November 2019.  PET CT scan 11/16/2017: Resolution of metabolic adenopathy in the neck.  I reviewed the radiology images with the patient. Return to clinic in 6 months with labs and follow-up We will perform scans once a year     Orders Placed This Encounter  Procedures  . IR Removal Tun Access W/ Port W/O FL    Standing Status:   Future    Standing Expiration Date:   01/19/2019    Order Specific Question:   Reason for exam:    Answer:   Removal of port    Order Specific Question:    Preferred Imaging Location?    Answer:   [facility]  . MM DIAG BREAST TOMO BILATERAL    Standing Status:   Future    Standing Expiration Date:   11/18/2018    Order Specific Question:   Reason for Exam (SYMPTOM  OR DIAGNOSIS REQUIRED)    Answer:   annual mammogram for h/o Breast cancer    Order Specific Question:   Preferred imaging location?    Answer:   [facility]  . CBC with Differential (Cancer Center Only)    Standing Status:   Future    Standing Expiration Date:   11/18/2018  . CMP ([place] only)    Standing Status:   Future    Standing Expiration Date:   11/18/2018  . Lactate dehydrogenase (LDH)    Standing Status:   Future    Standing Expiration Date:   11/17/2018   The patient has a good understanding of the overall plan. she agrees with it. she will call with any problems that may develop before the next visit here.   Harriette Ohara, MD 11/17/17

## 2017-11-17 NOTE — Assessment & Plan Note (Signed)
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  Plan to treat her until November 2019.  PET CT scan 11/16/2017: Resolution of metabolic adenopathy in the neck.  I reviewed the radiology images with the patient. Return to clinic in 6 months with labs and follow-up We will perform scans once a year

## 2017-11-18 ENCOUNTER — Other Ambulatory Visit: Payer: Self-pay

## 2017-11-19 ENCOUNTER — Ambulatory Visit (HOSPITAL_COMMUNITY): Payer: Medicare Other

## 2017-11-19 ENCOUNTER — Other Ambulatory Visit: Payer: Medicare Other

## 2017-11-19 DIAGNOSIS — R1314 Dysphagia, pharyngoesophageal phase: Secondary | ICD-10-CM | POA: Diagnosis not present

## 2017-11-19 DIAGNOSIS — J3800 Paralysis of vocal cords and larynx, unspecified: Secondary | ICD-10-CM | POA: Diagnosis not present

## 2017-11-19 DIAGNOSIS — K222 Esophageal obstruction: Secondary | ICD-10-CM | POA: Diagnosis not present

## 2017-11-19 DIAGNOSIS — J860 Pyothorax with fistula: Secondary | ICD-10-CM | POA: Diagnosis not present

## 2017-11-19 DIAGNOSIS — Z87891 Personal history of nicotine dependence: Secondary | ICD-10-CM | POA: Diagnosis not present

## 2017-11-20 ENCOUNTER — Ambulatory Visit: Payer: Medicare Other | Admitting: Hematology and Oncology

## 2017-11-27 ENCOUNTER — Other Ambulatory Visit: Payer: Self-pay | Admitting: Radiology

## 2017-11-30 ENCOUNTER — Other Ambulatory Visit: Payer: Self-pay

## 2017-11-30 ENCOUNTER — Ambulatory Visit (HOSPITAL_COMMUNITY)
Admission: RE | Admit: 2017-11-30 | Discharge: 2017-11-30 | Disposition: A | Discharge status: Home / Self Care | Payer: Medicare Other | Source: Ambulatory Visit | Attending: Hematology and Oncology | Admitting: Hematology and Oncology

## 2017-11-30 ENCOUNTER — Encounter (HOSPITAL_COMMUNITY): Payer: Self-pay

## 2017-11-30 DIAGNOSIS — Z89021 Acquired absence of right finger(s): Secondary | ICD-10-CM | POA: Insufficient documentation

## 2017-11-30 DIAGNOSIS — E669 Obesity, unspecified: Secondary | ICD-10-CM | POA: Insufficient documentation

## 2017-11-30 DIAGNOSIS — I10 Essential (primary) hypertension: Secondary | ICD-10-CM | POA: Diagnosis not present

## 2017-11-30 DIAGNOSIS — K219 Gastro-esophageal reflux disease without esophagitis: Secondary | ICD-10-CM | POA: Diagnosis not present

## 2017-11-30 DIAGNOSIS — I739 Peripheral vascular disease, unspecified: Secondary | ICD-10-CM | POA: Insufficient documentation

## 2017-11-30 DIAGNOSIS — Z7901 Long term (current) use of anticoagulants: Secondary | ICD-10-CM | POA: Diagnosis not present

## 2017-11-30 DIAGNOSIS — Z853 Personal history of malignant neoplasm of breast: Secondary | ICD-10-CM | POA: Insufficient documentation

## 2017-11-30 DIAGNOSIS — Z89022 Acquired absence of left finger(s): Secondary | ICD-10-CM | POA: Diagnosis not present

## 2017-11-30 DIAGNOSIS — M1990 Unspecified osteoarthritis, unspecified site: Secondary | ICD-10-CM | POA: Insufficient documentation

## 2017-11-30 DIAGNOSIS — Z87891 Personal history of nicotine dependence: Secondary | ICD-10-CM | POA: Diagnosis not present

## 2017-11-30 DIAGNOSIS — Z923 Personal history of irradiation: Secondary | ICD-10-CM | POA: Insufficient documentation

## 2017-11-30 DIAGNOSIS — Z8572 Personal history of non-Hodgkin lymphomas: Secondary | ICD-10-CM | POA: Insufficient documentation

## 2017-11-30 DIAGNOSIS — Z452 Encounter for adjustment and management of vascular access device: Secondary | ICD-10-CM | POA: Diagnosis not present

## 2017-11-30 DIAGNOSIS — C8330 Diffuse large B-cell lymphoma, unspecified site: Secondary | ICD-10-CM

## 2017-11-30 DIAGNOSIS — E039 Hypothyroidism, unspecified: Secondary | ICD-10-CM | POA: Insufficient documentation

## 2017-11-30 HISTORY — PX: IR REMOVAL TUN ACCESS W/ PORT W/O FL MOD SED: IMG2290

## 2017-11-30 LAB — CBC WITH DIFFERENTIAL/PLATELET
BASOS ABS: 0 10*3/uL (ref 0.0–0.1)
Basophils Relative: 0 %
Eosinophils Absolute: 0.2 10*3/uL (ref 0.0–0.7)
Eosinophils Relative: 3 %
HEMATOCRIT: 40.6 % (ref 36.0–46.0)
Hemoglobin: 13.3 g/dL (ref 12.0–15.0)
LYMPHS PCT: 20 %
Lymphs Abs: 1.8 10*3/uL (ref 0.7–4.0)
MCH: 28.1 pg (ref 26.0–34.0)
MCHC: 32.8 g/dL (ref 30.0–36.0)
MCV: 85.8 fL (ref 78.0–100.0)
MONO ABS: 0.8 10*3/uL (ref 0.1–1.0)
Monocytes Relative: 9 %
NEUTROS ABS: 6 10*3/uL (ref 1.7–7.7)
Neutrophils Relative %: 68 %
Platelets: 242 10*3/uL (ref 150–400)
RBC: 4.73 MIL/uL (ref 3.87–5.11)
RDW: 15.5 % (ref 11.5–15.5)
WBC: 8.9 10*3/uL (ref 4.0–10.5)

## 2017-11-30 LAB — BASIC METABOLIC PANEL
ANION GAP: 11 (ref 5–15)
BUN: 26 mg/dL — ABNORMAL HIGH (ref 8–23)
CO2: 26 mmol/L (ref 22–32)
Calcium: 9.4 mg/dL (ref 8.9–10.3)
Chloride: 103 mmol/L (ref 98–111)
Creatinine, Ser: 0.86 mg/dL (ref 0.44–1.00)
GFR calc Af Amer: >60 mL/min (ref >60)
GLUCOSE: 106 mg/dL — AB (ref 70–99)
POTASSIUM: 3.8 mmol/L (ref 3.5–5.1)
Sodium: 140 mmol/L (ref 135–145)

## 2017-11-30 LAB — PROTIME-INR
INR: 0.97
Prothrombin Time: 12.8 seconds (ref 11.4–15.2)

## 2017-11-30 MED ORDER — SODIUM CHLORIDE 0.9 % IV SOLN
INTRAVENOUS | Status: DC
  Administered 2017-11-30: 13:00:00 via INTRAVENOUS

## 2017-11-30 MED ORDER — FENTANYL CITRATE (PF) 100 MCG/2ML IJ SOLN
INTRAMUSCULAR | Status: AC
  Filled 2017-11-30: qty 2

## 2017-11-30 MED ORDER — LIDOCAINE HCL 1 % IJ SOLN
INTRAMUSCULAR | Status: AC
  Filled 2017-11-30: qty 20

## 2017-11-30 MED ORDER — MIDAZOLAM HCL 2 MG/2ML IJ SOLN
INTRAMUSCULAR | Status: AC | PRN
  Administered 2017-11-30 (×2): 1 mg via INTRAVENOUS

## 2017-11-30 MED ORDER — FENTANYL CITRATE (PF) 100 MCG/2ML IJ SOLN
INTRAMUSCULAR | Status: AC | PRN
  Administered 2017-11-30 (×2): 50 ug via INTRAVENOUS

## 2017-11-30 MED ORDER — MIDAZOLAM HCL 2 MG/2ML IJ SOLN
INTRAMUSCULAR | Status: AC
  Filled 2017-11-30: qty 2

## 2017-11-30 MED ORDER — LIDOCAINE HCL (PF) 1 % IJ SOLN
INTRAMUSCULAR | Status: AC | PRN
  Administered 2017-11-30: 10 mL

## 2017-11-30 MED ORDER — CEFAZOLIN SODIUM-DEXTROSE 2-4 GM/100ML-% IV SOLN
2.0000 g | INTRAVENOUS | Status: AC
  Administered 2017-11-30: 2 g via INTRAVENOUS

## 2017-11-30 MED ORDER — CEFAZOLIN SODIUM-DEXTROSE 2-4 GM/100ML-% IV SOLN
INTRAVENOUS | Status: AC
  Administered 2017-11-30: 2 g via INTRAVENOUS
  Filled 2017-11-30: qty 100

## 2017-11-30 NOTE — Procedures (Signed)
Successful removal of right chest port. Minimal blood loss and no immediate complication.

## 2017-11-30 NOTE — H&P (Signed)
Chief Complaint: Patient was seen in consultation today for lymphoma, breast cancer.  Referring Physician(s): FIEPPI,RJJOA  Supervising Physician: Markus Daft  Patient Status: [facility] - Out-pt  History of Present Illness: Kristen Escobar is a 72 y.o. female with past medical history of diffuse large B-cell lymphoma, breast cancer, and HTN who is well-known to IR service from previous procedures.  She had a Port-A-Cath placed 01/14/17 by Dr. Pascal Lux which was used for chemotherapy.  She has since completed treatment and no longer needs her Port-A-Cath.  She presents to IR today for removal.   Patient presents for procedure today in her usual state of health. Denies concerns or new symptoms.  She has been NPO.  She last took Xarelto on Saturday.   Past Medical History:  Diagnosis Date  . Acute respiratory failure with hypoxia ([place]) 02/16/2017  . Arthritis   . Cancer ([place]) 03/2009   breast- rt  . Diffuse large B cell lymphoma ([place]) dx'd 02/17/17  . GERD (gastroesophageal reflux disease)   . History of radiation therapy 07/12/10,completed   right breast 60 Gy x30 fx  . Hypertension   . Hypothyroidism   . Obesity   . Peripheral vascular disease ([place]) 1995   PT DEVELOPED CIRCULATION PROBLEMS IN BOTH HANDS AND GANGRENE OF BOTH INDEX FINGERS--REQUIRING AMPUTATION OF THE INDEX FINGERS AND VASCULAR SURGERY.  PT TOLD HER PROBLEMS RELATED TO SMOKING.   NO OTHER PROBLEMS SINCE.  Marland Kitchen Pneumonia   . Thyroid mass     Past Surgical History:  Procedure Laterality Date  . AMPUTATION     partial amputation of both index fingers  . BREAST SURGERY  2011   lumpectomy with node sampling- RIGHT  . COLONOSCOPY    . ESOPHAGOGASTRODUODENOSCOPY    . EXCISION MASS NECK Left 12/26/2016   Procedure: EXCISION MASS NECK;  Surgeon: Izora Gala, MD;  Location: [facility];  Service: ENT;  Laterality: Left;  open excision of thyroid mass left side with frozen section  . HAND SURGERY Bilateral 1995   Amputaed pointer  fingers bilaterally  . IR FLUORO GUIDE PORT INSERTION RIGHT  01/14/2017  . IR GASTROSTOMY TUBE MOD SED  02/19/2017  . IR REPLACE G-TUBE SIMPLE WO FLUORO  08/03/2017  . IR REPLC GASTRO/COLONIC TUBE PERCUT W/FLUORO  02/25/2017  . IR [place] TUBE PERCUT W/FLUORO  03/09/2017  . IR US GUIDE VASC ACCESS RIGHT  01/14/2017  . TOTAL HIP ARTHROPLASTY  12/16/2011   right hip  . TOTAL HIP ARTHROPLASTY  01/20/2012   Procedure: TOTAL HIP ARTHROPLASTY ANTERIOR APPROACH;  Surgeon: Mauri Pole, MD;  Location: [facility];  Service: Orthopedics;  Laterality: Left;  . TUBAL LIGATION    . VASCULAR SURGERY     both hands    Allergies: Patient has no known allergies.  Medications: Prior to Admission medications   Medication Sig Start Date End Date Taking? Authorizing Provider  furosemide (LASIX) 20 MG tablet Take 1 tablet (20 mg total) by mouth 2 (two) times daily. 06/11/17  Yes Nicholas Lose, MD  levothyroxine (SYNTHROID, LEVOTHROID) 50 MCG tablet Place 1 tablet (50 mcg total) into feeding tube daily. 03/13/17  Yes Cherene Altes, MD  omeprazole (PRILOSEC) 20 MG capsule Take 20 mg daily by mouth.   Yes [provider]  rivaroxaban (XARELTO) 20 MG TABS tablet Take 1 tablet (20 mg total) by mouth daily with supper. 07/22/17  Yes Nicholas Lose, MD  heparin lock flush 100 UNIT/ML SOLN injection Inject 5 mLs (500 Units total) into  the vein once for 1 dose. 06/24/17 06/24/17  Nicholas Lose, MD  loperamide (IMODIUM A-D) 2 MG tablet Place 2 mg into feeding tube 4 (four) times daily as needed for diarrhea or loose stools.    [provider]  sodium chloride flush 0.9 % SOLN injection Inject 10 mLs into the vein as needed. 06/24/17   Nicholas Lose, MD     Family History  Problem Relation Age of Onset  . Cancer Mother        pt unaware of what kind  . Hearing loss Mother   . Coronary artery disease Father   . Diabetes Father   . Coronary artery disease Brother   . Coronary artery disease  Brother   . Diabetes Sister   . Cancer Sister        breast  . Colon cancer Neg Hx   . Stomach cancer Neg Hx   . Rectal cancer Neg Hx   . Esophageal cancer Neg Hx     Social History   Socioeconomic History  . Marital status: Married    Spouse name: Not on file  . Number of children: Not on file  . Years of education: Not on file  . Highest education level: Not on file  Occupational History  . Not on file  Social Needs  . Financial resource strain: Not on file  . Food insecurity:    Worry: Not on file    Inability: Not on file  . Transportation needs:    Medical: Not on file    Non-medical: Not on file  Tobacco Use  . Smoking status: Former Smoker    Packs/day: 1.00    Years: 20.00    Pack years: 20.00    Last attempt to quit: 04/21/1993    Years since quitting: 24.6  . Smokeless tobacco: Never Used  Substance and Sexual Activity  . Alcohol use: Yes    Comment: OCCAS - MAYBE ONCE A MONTH  . Drug use: No  . Sexual activity: Yes  Lifestyle  . Physical activity:    Days per week: Not on file    Minutes per session: Not on file  . Stress: Not on file  Relationships  . Social connections:    Talks on phone: Not on file    Gets together: Not on file    Attends religious service: Not on file    Active member of club or organization: Not on file    Attends meetings of clubs or organizations: Not on file    Relationship status: Not on file  Other Topics Concern  . Not on file  Social History Narrative  . Not on file    Review of Systems: A 12 point ROS discussed and pertinent positives are indicated in the HPI above.  All other systems are negative.  Review of Systems  Constitutional: Negative for fatigue and fever.  Respiratory: Negative for cough and shortness of breath.   Cardiovascular: Negative for chest pain.  Gastrointestinal: Negative for abdominal pain, nausea and vomiting.  Psychiatric/Behavioral: Negative for behavioral problems and confusion.     Vital Signs: There were no vitals taken for this visit.  Physical Exam  Constitutional: She is oriented to person, place, and time. She appears well-developed. No distress.  Neck: Normal range of motion. Neck supple. No tracheal deviation present.  Port-A-Cath in place on right chest.  Well-healed.  Pulmonary/Chest: Effort normal and breath sounds normal. No respiratory distress. She has no wheezes.  Neurological: She  is alert and oriented to person, place, and time.  Skin: Skin is warm and dry. She is not diaphoretic.  Psychiatric: She has a normal mood and affect. Her behavior is normal. Judgment and thought content normal.  Nursing note and vitals reviewed.    MD Evaluation Airway: WNL Heart: WNL Abdomen: WNL Chest/ Lungs: WNL ASA  Classification: 3 Mallampati/Airway Score: One   Imaging: Nm Pet Image Restag (ps) Skull Base To Thigh  Result Date: 11/16/2017 CLINICAL DATA:  Subsequent treatment strategy for lymphoma. Large B-cell lymphoma. Additional history of RIGHT breast cancer. EXAM: NUCLEAR MEDICINE PET SKULL BASE TO THIGH TECHNIQUE: 9.87 mCi F-18 FDG was injected intravenously. Full-ring PET imaging was performed from the skull base to thigh after the radiotracer. CT data was obtained and used for attenuation correction and anatomic localization. Fasting blood glucose: 111 mg/dl COMPARISON:  PET-CT 12/08/2016 FINDINGS: Mediastinal blood pool activity: SUV max 3.4 NECK: Marked reduction in size and metabolic activity of lymph nodes in the thyroid bed and LEFT supraclavicular nodal station. No residual hypermetabolic lymph nodes remain. There is focal activity in the calcified body the level of the LEFT glottis which is new from prior may relate to a procedure (image 39/5) Incidental CT findings: none CHEST: No hypermetabolic mediastinal or hilar nodes. No suspicious pulmonary nodules on the CT scan. mild activity at the surgical site in the RIGHT breast is favored benign. No  change from prior. Incidental CT findings: Port in the anterior chest wall with tip in distal SVC. ABDOMEN/PELVIS: No abnormal hypermetabolic activity within the liver, pancreas, adrenal glands, or spleen. No hypermetabolic lymph nodes in the abdomen or pelvis. Incidental CT findings: Multiple subcutaneous injection sites in the ventral subcu fat of the abdomen. Metabolic activity along the percutaneous gastrostomy to is inflammatory. SKELETON: No focal hypermetabolic activity to suggest skeletal metastasis. Incidental CT findings: none IMPRESSION: 1. Resolution of metabolic adenopathy in the neck ( [place] 1. 2. No evidence of lymphoma recurrence. 3. No evidence of breast cancer recurrence Electronically Signed   By: Suzy Bouchard M.D.   On: 11/16/2017 10:01    Labs:  CBC: Recent Labs    04/10/17 1134 05/01/17 0851 11/17/17 0956 11/30/17 1316  WBC 8.2 7.7 8.3 8.9  HGB 8.6* 10.9* 13.0 13.3  HCT 26.7* 33.5* 40.6 40.6  PLT 390 359 209 242    COAGS: Recent Labs    12/01/16 0823 01/14/17 0745 02/19/17 0441 03/11/17 1227 11/30/17 1316  INR 0.94 1.02 1.10 2.45 0.97  APTT 25 24  --  32  --     BMP: Recent Labs    03/13/17 0926  04/10/17 1134 05/01/17 0851 11/17/17 0956 11/30/17 1316  NA 139   < > 137 141 142 140  K 3.8   < > 3.8 4.5 5.2* 3.8  CL 111  --   --  104 105 103  CO2 21*   < > 25 27 29 26   GLUCOSE 55*   < > 126 103 104* 106*  BUN 13   < > 15.9 19 31* 26*  CALCIUM 8.0*   < > 8.4 9.3 9.9 9.4  CREATININE 0.81   < > 0.8 0.85 0.99 0.86  GFRNONAA >60  --   --  >60 56* >60  GFRAA >60  --   --  >60 >60 >60   < > = values in this interval not displayed.    LIVER FUNCTION TESTS: Recent Labs    03/20/17 0827 04/10/17 1134 05/01/17 0851 11/17/17  0956  BILITOT 0.54 0.44 0.5 0.6  AST 28 35* 24 21  ALT 31 52 35 15  ALKPHOS 48 46 59 112  PROT 5.6* 5.8* 6.5 7.4  ALBUMIN 2.4* 2.6* 3.0* 3.6    TUMOR MARKERS: No results for input(s): AFPTM, CEA, CA199, CHROMGRNA  in the last 8760 hours.  Assessment and Plan: Patient with past medical history of lymphoma and breast cancer s/p treatment via Port-A-Cath placed 01/14/17.  She presents after completed of treatment for Port-A-Cath removal.  Case reviewed by Dr. Anselm Pancoast who approves patient for procedure.  Patient presents today in their usual state of health.  She has been NPO and is not currently on blood thinners.   Risks and benefits of image guided port-a-catheter placement was discussed with the patient including, but not limited to bleeding, infection, pneumothorax.  All of the patient's questions were answered, patient is agreeable to proceed. Consent signed and in chart.  Thank you for this interesting consult.  I greatly enjoyed meeting Litzy Dicker and look forward to participating in their care.  A copy of this report was sent to the requesting provider on this date.  Electronically Signed: Docia Barrier, PA 11/30/2017, 2:54 PM   I spent a total of    25 Minutes in face to face in clinical consultation, greater than 50% of which was counseling/coordinating care for lymphoma.

## 2017-11-30 NOTE — Discharge Instructions (Signed)
Implanted Port Removal, Care After °Refer to this sheet in the next few weeks. These instructions provide you with information about caring for yourself after your procedure. Your health care provider may also give you more specific instructions. Your treatment has been planned according to current medical practices, but problems sometimes occur. Call your health care provider if you have any problems or questions after your procedure. °What can I expect after the procedure? °After the procedure, it is common to have: °· Soreness or pain near your incision. °· Some swelling or bruising near your incision. ° °Follow these instructions at home: °Medicines °· Take over-the-counter and prescription medicines only as told by your health care provider. °· If you were prescribed an antibiotic medicine, take it as told by your health care provider. Do not stop taking the antibiotic even if you start to feel better. °Bathing °· Do not take baths, swim, or use a hot tub until your health care provider approves. Ask your health care provider if you can take showers. You may only be allowed to take sponge baths for bathing. °Incision care °· Follow instructions from your health care provider about how to take care of your incision. Make sure you: °? Wash your hands with soap and water before you change your bandage (dressing). If soap and water are not available, use hand sanitizer. °? Change your dressing as told by your health care provider. °? Keep your dressing dry. °? Leave stitches (sutures), skin glue, or adhesive strips in place. These skin closures may need to stay in place for 2 weeks or longer. If adhesive strip edges start to loosen and curl up, you may trim the loose edges. Do not remove adhesive strips completely unless your health care provider tells you to do that. °· Check your incision area every day for signs of infection. Check for: °? More redness, swelling, or pain. °? More fluid or  blood. °? Warmth. °? Pus or a bad smell. °Driving °· If you received a sedative, do not drive for 24 hours after the procedure. °· If you did not receive a sedative, ask your health care provider when it is safe to drive. °Activity °· Return to your normal activities as told by your health care provider. Ask your health care provider what activities are safe for you. °· Until your health care provider says it is safe: °? Do not lift anything that is heavier than 10 lb (4.5 kg). °? Do not do activities that involve lifting your arms over your head. °General instructions °· Do not use any tobacco products, such as cigarettes, chewing tobacco, and e-cigarettes. Tobacco can delay healing. If you need help quitting, ask your health care provider. °· Keep all follow-up visits as told by your health care provider. This is important. °Contact a health care provider if: °· You have more redness, swelling, or pain around your incision. °· You have more fluid or blood coming from your incision. °· Your incision feels warm to the touch. °· You have pus or a bad smell coming from your incision. °· You have a fever. °· You have pain that is not relieved by your pain medicine. °Get help right away if: °· You have chest pain. °· You have difficulty breathing. °This information is not intended to replace advice given to you by your health care provider. Make sure you discuss any questions you have with your health care provider. °Document Released: 03/19/2015 Document Revised: 09/13/2015 Document Reviewed: 01/10/2015 °Elsevier Interactive Patient   Education  2018 [place] Anesthesia, Adult, Care After These instructions provide you with information about caring for yourself after your procedure. Your health care provider may also give you more specific instructions. Your treatment has been planned according to current medical practices, but problems sometimes occur. Call your health care provider if you have any  problems or questions after your procedure. What can I expect after the procedure? After the procedure, it is common to have:  Vomiting.  A sore throat.  Mental slowness.  It is common to feel:  Nauseous.  Cold or shivery.  Sleepy.  Tired.  Sore or achy, even in parts of your body where you did not have surgery.  Follow these instructions at home: For at least 24 hours after the procedure:  Do not: ? Participate in activities where you could fall or become injured. ? Drive. ? Use heavy machinery. ? Drink alcohol. ? Take sleeping pills or medicines that cause drowsiness. ? Make important decisions or sign legal documents. ? Take care of children on your own.  Rest. Eating and drinking  If you vomit, drink water, juice, or soup when you can drink without vomiting.  Drink enough fluid to keep your urine clear or pale yellow.  Make sure you have little or no nausea before eating solid foods.  Follow the diet recommended by your health care provider. General instructions  Have a responsible adult stay with you until you are awake and alert.  Return to your normal activities as told by your health care provider. Ask your health care provider what activities are safe for you.  Take over-the-counter and prescription medicines only as told by your health care provider.  If you smoke, do not smoke without supervision.  Keep all follow-up visits as told by your health care provider. This is important. Contact a health care provider if:  You continue to have nausea or vomiting at home, and medicines are not helpful.  You cannot drink fluids or start eating again.  You cannot urinate after 8-12 hours.  You develop a skin rash.  You have fever.  You have increasing redness at the site of your procedure. Get help right away if:  You have difficulty breathing.  You have chest pain.  You have unexpected bleeding.  You feel that you are having a  life-threatening or urgent problem. This information is not intended to replace advice given to you by your health care provider. Make sure you discuss any questions you have with your health care provider. Document Released: 07/14/2000 Document Revised: 09/10/2015 Document Reviewed: 03/22/2015 Elsevier Interactive Patient Education  [employer].

## 2017-12-04 ENCOUNTER — Ambulatory Visit
Admission: RE | Admit: 2017-12-04 | Discharge: 2017-12-04 | Disposition: A | Discharge status: Home / Self Care | Payer: Medicare Other | Source: Ambulatory Visit | Attending: Hematology and Oncology | Admitting: Hematology and Oncology

## 2017-12-04 ENCOUNTER — Other Ambulatory Visit: Payer: Self-pay | Admitting: Hematology and Oncology

## 2017-12-04 DIAGNOSIS — Z170 Estrogen receptor positive status [ER+]: Secondary | ICD-10-CM

## 2017-12-04 DIAGNOSIS — Z1231 Encounter for screening mammogram for malignant neoplasm of breast: Secondary | ICD-10-CM

## 2017-12-04 DIAGNOSIS — C50411 Malignant neoplasm of upper-outer quadrant of right female breast: Secondary | ICD-10-CM

## 2017-12-04 HISTORY — DX: Personal history of irradiation: Z92.3

## 2017-12-05 ENCOUNTER — Other Ambulatory Visit: Payer: Self-pay | Admitting: Internal Medicine

## 2017-12-07 NOTE — Telephone Encounter (Signed)
Pt has not had TSH checked since 03/25/2016

## 2017-12-29 DIAGNOSIS — Z8579 Personal history of other malignant neoplasms of lymphoid, hematopoietic and related tissues: Secondary | ICD-10-CM | POA: Diagnosis not present

## 2017-12-29 DIAGNOSIS — R1310 Dysphagia, unspecified: Secondary | ICD-10-CM | POA: Diagnosis not present

## 2017-12-29 DIAGNOSIS — K222 Esophageal obstruction: Secondary | ICD-10-CM | POA: Diagnosis not present

## 2017-12-29 DIAGNOSIS — J3800 Paralysis of vocal cords and larynx, unspecified: Secondary | ICD-10-CM | POA: Diagnosis not present

## 2017-12-29 DIAGNOSIS — Z931 Gastrostomy status: Secondary | ICD-10-CM | POA: Diagnosis not present

## 2017-12-31 DIAGNOSIS — M1711 Unilateral primary osteoarthritis, right knee: Secondary | ICD-10-CM | POA: Diagnosis not present

## 2018-01-08 DIAGNOSIS — M1711 Unilateral primary osteoarthritis, right knee: Secondary | ICD-10-CM | POA: Diagnosis not present

## 2018-01-15 DIAGNOSIS — M1711 Unilateral primary osteoarthritis, right knee: Secondary | ICD-10-CM | POA: Diagnosis not present

## 2018-01-20 ENCOUNTER — Other Ambulatory Visit: Payer: Self-pay | Admitting: Hematology and Oncology

## 2018-01-20 ENCOUNTER — Telehealth: Payer: Self-pay

## 2018-01-20 NOTE — Telephone Encounter (Signed)
Error

## 2018-01-25 ENCOUNTER — Ambulatory Visit (INDEPENDENT_AMBULATORY_CARE_PROVIDER_SITE_OTHER): Payer: Medicare Other

## 2018-01-25 DIAGNOSIS — Z23 Encounter for immunization: Secondary | ICD-10-CM

## 2018-02-03 ENCOUNTER — Telehealth: Payer: Self-pay

## 2018-02-03 NOTE — Telephone Encounter (Signed)
Returned pt's husband's call regarding clarification on xarelto medication. Pt was supposed to stop taking Xarelto, end of November 2019. She has enough to last til mid November, and wanted to know if pt will need to refill until the end of November 2019. Told husband to fill xarelto and take, as instructed by Dr.Gudena, until Nov. 30th. Pt and husband verbalized understanding and has no further questions at this time.

## 2018-02-04 ENCOUNTER — Other Ambulatory Visit: Payer: Self-pay | Admitting: Hematology and Oncology

## 2018-02-09 DIAGNOSIS — R1313 Dysphagia, pharyngeal phase: Secondary | ICD-10-CM | POA: Diagnosis not present

## 2018-02-09 DIAGNOSIS — R1310 Dysphagia, unspecified: Secondary | ICD-10-CM | POA: Diagnosis not present

## 2018-02-09 DIAGNOSIS — Z87891 Personal history of nicotine dependence: Secondary | ICD-10-CM | POA: Diagnosis not present

## 2018-02-09 DIAGNOSIS — K222 Esophageal obstruction: Secondary | ICD-10-CM | POA: Diagnosis not present

## 2018-02-09 DIAGNOSIS — Z8579 Personal history of other malignant neoplasms of lymphoid, hematopoietic and related tissues: Secondary | ICD-10-CM | POA: Diagnosis not present

## 2018-02-09 DIAGNOSIS — J3800 Paralysis of vocal cords and larynx, unspecified: Secondary | ICD-10-CM | POA: Diagnosis not present

## 2018-02-09 DIAGNOSIS — J860 Pyothorax with fistula: Secondary | ICD-10-CM | POA: Diagnosis not present

## 2018-02-09 DIAGNOSIS — Z931 Gastrostomy status: Secondary | ICD-10-CM | POA: Diagnosis not present

## 2018-02-20 ENCOUNTER — Other Ambulatory Visit: Payer: Self-pay | Admitting: Hematology and Oncology

## 2018-03-02 DIAGNOSIS — J860 Pyothorax with fistula: Secondary | ICD-10-CM | POA: Diagnosis not present

## 2018-03-05 DIAGNOSIS — M1711 Unilateral primary osteoarthritis, right knee: Secondary | ICD-10-CM | POA: Diagnosis not present

## 2018-03-21 ENCOUNTER — Other Ambulatory Visit: Payer: Self-pay | Admitting: Hematology and Oncology

## 2018-03-23 DIAGNOSIS — E039 Hypothyroidism, unspecified: Secondary | ICD-10-CM | POA: Diagnosis not present

## 2018-03-23 DIAGNOSIS — Z87891 Personal history of nicotine dependence: Secondary | ICD-10-CM | POA: Diagnosis not present

## 2018-03-23 DIAGNOSIS — R1313 Dysphagia, pharyngeal phase: Secondary | ICD-10-CM | POA: Diagnosis not present

## 2018-03-23 DIAGNOSIS — K222 Esophageal obstruction: Secondary | ICD-10-CM | POA: Diagnosis not present

## 2018-03-23 DIAGNOSIS — Z8579 Personal history of other malignant neoplasms of lymphoid, hematopoietic and related tissues: Secondary | ICD-10-CM | POA: Diagnosis not present

## 2018-03-23 DIAGNOSIS — J860 Pyothorax with fistula: Secondary | ICD-10-CM | POA: Diagnosis not present

## 2018-03-23 DIAGNOSIS — Z8572 Personal history of non-Hodgkin lymphomas: Secondary | ICD-10-CM | POA: Diagnosis not present

## 2018-03-31 ENCOUNTER — Ambulatory Visit (INDEPENDENT_AMBULATORY_CARE_PROVIDER_SITE_OTHER): Payer: Medicare Other | Admitting: Family Medicine

## 2018-03-31 ENCOUNTER — Other Ambulatory Visit: Payer: Self-pay

## 2018-03-31 ENCOUNTER — Encounter: Payer: Self-pay | Admitting: Family Medicine

## 2018-03-31 VITALS — BP 134/76 | HR 85 | Temp 97.7°F | Ht 62.0 in | Wt 195.7 lb

## 2018-03-31 DIAGNOSIS — I731 Thromboangiitis obliterans [Buerger's disease]: Secondary | ICD-10-CM | POA: Diagnosis not present

## 2018-03-31 DIAGNOSIS — E039 Hypothyroidism, unspecified: Secondary | ICD-10-CM

## 2018-03-31 DIAGNOSIS — R739 Hyperglycemia, unspecified: Secondary | ICD-10-CM | POA: Diagnosis not present

## 2018-03-31 DIAGNOSIS — Z01818 Encounter for other preprocedural examination: Secondary | ICD-10-CM | POA: Diagnosis not present

## 2018-03-31 LAB — POCT GLYCOSYLATED HEMOGLOBIN (HGB A1C): Hemoglobin A1C: 5.3 % (ref 4.0–5.6)

## 2018-03-31 LAB — TSH: TSH: 3.49 u[IU]/mL (ref 0.35–4.50)

## 2018-03-31 NOTE — Progress Notes (Signed)
Subjective:     Patient ID: Kristen Escobar, female   DOB: Mar 20, 1946, 72 y.o.   MRN: [id number]  HPI Patient is here to establish care with me and also for pre-surgical clearance for right total knee replacement which is scheduled at this point February 10.  She has multiple chronic problems including history of obesity, hypertension, past history of pulmonary embolus, tracheoesophageal fistula, impaired glucose tolerance, hypothyroidism, osteoarthritis, chronic kidney disease, remote history of breast cancer from 2010, history of large B-cell lymphoma.  She finished chemotherapy for her lymphoma about a year ago  No cardiac history.  She had echocardiogram about a year ago which showed ejection fraction in the normal range with no major valvular issues.  She has had no recent chest pains.  She has mild hyperglycemia in the past but no history of type 2 diabetes.  She took Xarelto for a year following her pulmonary embolus and has been off the Xarelto for a few months now.  She had history of Buerger's disease several years ago and had partial amputation of a couple fingers related to that.  She quit smoking several years ago  She gets regular lab work every 6 months through oncology with CBC and comprehensive metabolic panel  She has hypothyroidism and is on replacement but has not had labs in about 2 years for her thyroid  Past Medical History:  Diagnosis Date  . Acute respiratory failure with hypoxia ([place]) 02/16/2017  . Arthritis   . Cancer ([place]) 03/2009   breast- rt  . Diffuse large B cell lymphoma ([place]) dx'd 02/17/17  . GERD (gastroesophageal reflux disease)   . History of radiation therapy 07/12/10,completed   right breast 60 Gy x30 fx  . Hypertension   . Hypothyroidism   . Obesity   . Peripheral vascular disease ([place]) 1995   PT DEVELOPED CIRCULATION PROBLEMS IN BOTH HANDS AND GANGRENE OF BOTH INDEX FINGERS--REQUIRING AMPUTATION OF THE INDEX FINGERS AND VASCULAR SURGERY.  PT TOLD  HER PROBLEMS RELATED TO SMOKING.   NO OTHER PROBLEMS SINCE.  Marland Kitchen Personal history of radiation therapy 2012   Right Breast Cancer  . Pneumonia   . Thyroid mass    Past Surgical History:  Procedure Laterality Date  . AMPUTATION     partial amputation of both index fingers  . BREAST LUMPECTOMY Right 04/01/2010  . BREAST SURGERY  2011   lumpectomy with node sampling- RIGHT  . COLONOSCOPY    . ESOPHAGOGASTRODUODENOSCOPY    . EXCISION MASS NECK Left 12/26/2016   Procedure: EXCISION MASS NECK;  Surgeon: Izora Gala, MD;  Location: [facility];  Service: ENT;  Laterality: Left;  open excision of thyroid mass left side with frozen section  . HAND SURGERY Bilateral 1995   Amputaed pointer fingers bilaterally  . IR FLUORO GUIDE PORT INSERTION RIGHT  01/14/2017  . IR GASTROSTOMY TUBE MOD SED  02/19/2017  . IR REMOVAL TUN ACCESS W/ PORT W/O FL MOD SED  11/30/2017  . IR REPLACE G-TUBE SIMPLE WO FLUORO  08/03/2017  . IR REPLC GASTRO/COLONIC TUBE PERCUT W/FLUORO  02/25/2017  . IR [place] TUBE PERCUT W/FLUORO  03/09/2017  . IR US GUIDE VASC ACCESS RIGHT  01/14/2017  . TOTAL HIP ARTHROPLASTY  12/16/2011   right hip  . TOTAL HIP ARTHROPLASTY  01/20/2012   Procedure: TOTAL HIP ARTHROPLASTY ANTERIOR APPROACH;  Surgeon: Mauri Pole, MD;  Location[facility];  Service: Orthopedics;  Laterality: Left;  . TUBAL LIGATION    . VASCULAR SURGERY  both hands    reports that she quit smoking about 24 years ago. She has a 20.00 pack-year smoking history. She has never used smokeless tobacco. She reports that she drinks alcohol. She reports that she does not use drugs. family history includes Cancer in her mother and sister; Coronary artery disease in her brother, brother, and father; Diabetes in her father and sister; Hearing loss in her mother. No Known Allergies  Review of Systems  Constitutional: Negative for fatigue and unexpected weight change.  Eyes: Negative for visual disturbance.  Respiratory:  Negative for cough, chest tightness, shortness of breath and wheezing.   Cardiovascular: Negative for chest pain, palpitations and leg swelling.  Gastrointestinal: Negative for abdominal pain.  Genitourinary: Negative for dysuria.  Neurological: Negative for dizziness, seizures, syncope, weakness, light-headedness and headaches.       Objective:   Physical Exam  Constitutional: She appears well-developed and well-nourished.  Eyes: Pupils are equal, round, and reactive to light.  Neck: Neck supple. No JVD present. No thyromegaly present.  Cardiovascular: Normal rate and regular rhythm. Exam reveals no gallop.  She does have faint systolic murmur right upper sternal border.  Pulmonary/Chest: Effort normal and breath sounds normal. No respiratory distress. She has no wheezes. She has no rales.  Musculoskeletal: She exhibits no edema.  Neurological: She is alert.       Assessment:     #1 osteoarthritis with anticipated right total knee replacement in February  #2 history of B-cell lymphoma with completion of chemotherapy last year  #3 remote history of Buerger's disease with a couple of finger partial amputations related to that.  She has not smoked in several years since 1995  #4 history of pulmonary embolus while she was being treated for her lymphoma.  Now off Xarelto  #5 history of mild hyperglycemia  #6 hypothyroidism- on replacement.    Plan:     -Recheck A1c -Check follow-up EKG (sinus rhythm with probable antero-lateral repolarization variant)- pt totally asymptomatic. -We elected not to get other labs today since she gets these regularly through oncology and is scheduled for follow-up lab work there in January which will be about 1 month prior to her knee surgery. -Repeat TSH today  Eulas Post MD [facility]

## 2018-03-31 NOTE — Patient Instructions (Signed)
Your A1C is 5.3%- which is goos  No acute concerns on EKG.  Hope your surgery goes well.   We will get necessary paperwork in to Dr Alusio's office.

## 2018-04-01 ENCOUNTER — Encounter: Payer: Self-pay | Admitting: Family Medicine

## 2018-04-26 DIAGNOSIS — J860 Pyothorax with fistula: Secondary | ICD-10-CM | POA: Diagnosis not present

## 2018-05-13 DIAGNOSIS — R490 Dysphonia: Secondary | ICD-10-CM | POA: Diagnosis not present

## 2018-05-13 DIAGNOSIS — J3801 Paralysis of vocal cords and larynx, unilateral: Secondary | ICD-10-CM | POA: Diagnosis not present

## 2018-05-13 DIAGNOSIS — J860 Pyothorax with fistula: Secondary | ICD-10-CM | POA: Diagnosis not present

## 2018-05-13 DIAGNOSIS — K219 Gastro-esophageal reflux disease without esophagitis: Secondary | ICD-10-CM | POA: Diagnosis not present

## 2018-05-13 DIAGNOSIS — Z87891 Personal history of nicotine dependence: Secondary | ICD-10-CM | POA: Diagnosis not present

## 2018-05-13 DIAGNOSIS — R1313 Dysphagia, pharyngeal phase: Secondary | ICD-10-CM | POA: Diagnosis not present

## 2018-05-13 DIAGNOSIS — I10 Essential (primary) hypertension: Secondary | ICD-10-CM | POA: Diagnosis not present

## 2018-05-13 DIAGNOSIS — K222 Esophageal obstruction: Secondary | ICD-10-CM | POA: Diagnosis not present

## 2018-05-13 DIAGNOSIS — Z8572 Personal history of non-Hodgkin lymphomas: Secondary | ICD-10-CM | POA: Diagnosis not present

## 2018-05-13 DIAGNOSIS — J9504 Tracheo-esophageal fistula following tracheostomy: Secondary | ICD-10-CM | POA: Diagnosis not present

## 2018-05-13 NOTE — Progress Notes (Signed)
Patient Care Team: Eulas Post, MD as PCP - General (Family Medicine) Marcy Panning, MD as Consulting Physician (Internal Medicine) Thea Silversmith, MD as Consulting Physician (Radiation Oncology) Nicholas Lose, MD as Consulting Physician (Hematology and Oncology)  DIAGNOSIS:    ICD-10-CM   1. 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 Radiation recall related to tamoxifen    11/25/2010 -  Anti-estrogen oral therapy    Aromasin 25 mg daily.myalgias and arthralgias stop November 2012 went back to tamoxifen 20 mg daily    01/06/2017 Pathology Results    Bone marrow biopsy: Slightly hypercellular bone marrow for age with granulocytic hyperplasia, numerous small lymphoid aggregates present. 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 with above-mentioned history of breast cancer and lymphoma. Her most recent mammogram from 12/04/17 showed no evidence of malignancy bilaterally. She presents to the clinic today with her husband and has been doing well. She recently had an injection at [facility] for her voice box and her tracheoesophageal fistula is slowly improving over time. She is still on tube feeds and is tolerating them well. She has a knee replacement scheduled for 05/31/18.   REVIEW OF SYSTEMS:   Constitutional: Denies fevers, chills or abnormal weight loss Eyes: Denies blurriness of vision Ears, nose, mouth, throat, and face: Denies mucositis or sore throat Respiratory: Denies cough, dyspnea or wheezes Cardiovascular: Denies palpitation, chest discomfort Gastrointestinal:  Denies nausea, heartburn or change in bowel habits (+) PEG tube feeds (+) esophageal dilatation Skin: Denies abnormal skin rashes Lymphatics: Denies new lymphadenopathy or easy bruising Neurological: Denies numbness, tingling or new weaknesses Behavioral/Psych: Mood is stable, no new changes  Extremities: No lower extremity edema Breast: denies any pain or lumps or nodules in either breasts All other systems were reviewed with the patient and are negative.  I have reviewed the past medical history, past surgical history, social history and family history with the patient and they are unchanged from previous note.  ALLERGIES:  has No Known Allergies.  MEDICATIONS:  Current Outpatient Medications  Medication Sig Dispense Refill  . furosemide (LASIX) 20 MG tablet TAKE 1 TABLET BY MOUTH TWICE A DAY (Patient taking differently: Place 20 mg  into feeding tube daily. ) 180 tablet 1  . ibuprofen (ADVIL,MOTRIN) 200 MG tablet Place 400 mg into feeding tube daily as needed for moderate pain.    Marland Kitchen levothyroxine (SYNTHROID, LEVOTHROID) 50 MCG tablet TAKE 1 TABLET BY MOUTH ONCE DAILY (Patient taking differently: Place 50 mcg into feeding tube daily. ) 90 tablet 1  . loperamide (IMODIUM A-D) 2 MG tablet Place 2 mg into feeding tube 4 (four) times daily as needed for diarrhea or loose stools.    Marland Kitchen omeprazole (PRILOSEC OTC) 20 MG tablet Take 20 mg per tube daily     No current facility-administered medications for this visit.     PHYSICAL EXAMINATION: ECOG PERFORMANCE STATUS: 1 - Symptomatic but completely ambulatory  Vitals:   05/18/18 1006  BP: (!) 134/56  Pulse: 72  Resp: 16  Temp: 98.3 F (36.8 C)  SpO2: 99%   Filed Weights   05/18/18 1006  Weight: 192 lb 4.8 oz (87.2 kg)    GENERAL: alert, no distress and comfortable SKIN: skin color, texture, turgor are normal, no rashes or significant lesions EYES: normal, Conjunctiva are pink and non-injected, sclera clear OROPHARYNX: no exudate, no erythema and lips, buccal mucosa, and tongue normal  NECK: supple, thyroid normal size, non-tender, without nodularity LYMPH: no palpable lymphadenopathy in the cervical, axillary or inguinal LUNGS: clear to auscultation and percussion with normal breathing effort HEART: regular rate & rhythm and no murmurs and no lower extremity edema ABDOMEN: abdomen soft, non-tender and normal bowel sounds MUSCULOSKELETAL: no cyanosis of digits and no clubbing  NEURO: alert & oriented x 3 with fluent speech, no focal motor/sensory deficits EXTREMITIES: No lower extremity edema  LABORATORY DATA:  I have reviewed the data as listed CMP Latest Ref Rng & Units 05/18/2018 11/30/2017 11/17/2017  Glucose 70 - 99 mg/dL 98 106(H) 104(H)  BUN 8 - 23 mg/dL 30(H) 26(H) 31(H)  Creatinine 0.44 - 1.00 mg/dL 0.95 0.86 0.99  Sodium 135 - 145 mmol/L 140 140 142    Potassium 3.5 - 5.1 mmol/L 4.7 3.8 5.2(H)  Chloride 98 - 111 mmol/L 103 103 105  CO2 22 - 32 mmol/L '29 26 29  '$ Calcium 8.9 - 10.3 mg/dL 9.1 9.4 9.9  Total Protein 6.5 - 8.1 g/dL 7.1 - 7.4  Total Bilirubin 0.3 - 1.2 mg/dL 0.5 - 0.6  Alkaline Phos 38 - 126 U/L 109 - 112  AST 15 - 41 U/L 19 - 21  ALT 0 - 44 U/L 14 - 15    Lab Results  Component Value Date   WBC 9.3 05/18/2018   HGB 12.9 05/18/2018   HCT 40.1 05/18/2018   MCV 86.4 05/18/2018   PLT 190 05/18/2018   NEUTROABS 6.5 05/18/2018    ASSESSMENT & PLAN:  Breast cancer of upper-outer quadrant of right female breast ([place]) 12/26/2016 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 positive for LCA, CD20, CD79a PAX-5, BCL-6 and weak cytoplasmic kappa associated with patchy weak positivity for BCL-2 and CD10, Ki-67 40%  Stage IIb(bilateral cervical, esophageal, mediastinal, supraclavicular nodes)  Bone marrow biopsy negative for diffuse large B cell lymphoma but suggestive of small low-grade B cell population possibly SLL/CLL  Revised IPI score: 1, good prognosis, redictated for years PFS 80%, predicted overall survival 79% ------------------------------------------------------------------------ Prior treatment: R CHOPX 5 cycles  TE Fistula:Repaired currently on PEG tube feeds esophageal stricture: Dilatation and undergoing procedures to close the T-E fistula Being performed by gastroenterology 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 Completed November 2019.  PET CT scan 11/16/2017: Resolution of metabolic adenopathy in the neck.  Return to clinic in 6 months with labs and follow-up and scans      Orders Placed This Encounter  Procedures  . NM PET Image Restag (PS) Skull Base To Thigh    Standing Status:   Future    Standing Expiration Date:   05/18/2019    Order Specific Question:   ** REASON FOR EXAM (FREE TEXT)    Answer:   restaging     Order Specific Question:   If indicated for the ordered procedure, I authorize the administration of a radiopharmaceutical per Radiology protocol    Answer:   Yes    Order Specific Question:   Preferred imaging location?    Answer:   [facility]    Order Specific Question:   Radiology Contrast Protocol - do NOT remove file path    Answer:   [email or web address]  . CBC with Differential ([place] Only)    Standing Status:   Future    Standing Expiration Date:   05/19/2019  . CMP ([place] only)    Standing Status:   Future    Standing Expiration Date:   05/19/2019  . Lactate dehydrogenase (LDH)    Standing Status:   Future    Standing Expiration Date:   05/18/2019   The patient has a good understanding of the overall plan. she agrees with it. she will call with any problems that may develop before the next visit here.  Nicholas Lose, MD 05/18/2018  Julious Oka Dorshimer am acting as scribe for Dr. Nicholas Lose.  I have reviewed the above documentation for accuracy and completeness, and I agree with the above.

## 2018-05-18 ENCOUNTER — Encounter: Payer: Self-pay | Admitting: Hematology and Oncology

## 2018-05-18 ENCOUNTER — Telehealth: Payer: Self-pay | Admitting: Hematology and Oncology

## 2018-05-18 ENCOUNTER — Inpatient Hospital Stay (HOSPITAL_BASED_OUTPATIENT_CLINIC_OR_DEPARTMENT_OTHER): Payer: Medicare Other | Admitting: Hematology and Oncology

## 2018-05-18 ENCOUNTER — Inpatient Hospital Stay: Payer: Medicare Other | Attending: Hematology and Oncology

## 2018-05-18 DIAGNOSIS — C50411 Malignant neoplasm of upper-outer quadrant of right female breast: Secondary | ICD-10-CM

## 2018-05-18 DIAGNOSIS — Z923 Personal history of irradiation: Secondary | ICD-10-CM | POA: Diagnosis not present

## 2018-05-18 DIAGNOSIS — Z9221 Personal history of antineoplastic chemotherapy: Secondary | ICD-10-CM

## 2018-05-18 DIAGNOSIS — Z853 Personal history of malignant neoplasm of breast: Secondary | ICD-10-CM | POA: Diagnosis not present

## 2018-05-18 DIAGNOSIS — C8330 Diffuse large B-cell lymphoma, unspecified site: Secondary | ICD-10-CM

## 2018-05-18 DIAGNOSIS — C8331 Diffuse large B-cell lymphoma, lymph nodes of head, face, and neck: Secondary | ICD-10-CM | POA: Insufficient documentation

## 2018-05-18 DIAGNOSIS — Z79899 Other long term (current) drug therapy: Secondary | ICD-10-CM | POA: Insufficient documentation

## 2018-05-18 DIAGNOSIS — Z170 Estrogen receptor positive status [ER+]: Secondary | ICD-10-CM

## 2018-05-18 LAB — CBC WITH DIFFERENTIAL (CANCER CENTER ONLY)
ABS IMMATURE GRANULOCYTES: 0.02 10*3/uL (ref 0.00–0.07)
BASOS ABS: 0 10*3/uL (ref 0.0–0.1)
Basophils Relative: 0 %
EOS PCT: 3 %
Eosinophils Absolute: 0.3 10*3/uL (ref 0.0–0.5)
HEMATOCRIT: 40.1 % (ref 36.0–46.0)
HEMOGLOBIN: 12.9 g/dL (ref 12.0–15.0)
Immature Granulocytes: 0 %
LYMPHS ABS: 1.6 10*3/uL (ref 0.7–4.0)
LYMPHS PCT: 17 %
MCH: 27.8 pg (ref 26.0–34.0)
MCHC: 32.2 g/dL (ref 30.0–36.0)
MCV: 86.4 fL (ref 80.0–100.0)
Monocytes Absolute: 0.9 10*3/uL (ref 0.1–1.0)
Monocytes Relative: 10 %
NRBC: 0 % (ref 0.0–0.2)
Neutro Abs: 6.5 10*3/uL (ref 1.7–7.7)
Neutrophils Relative %: 70 %
Platelet Count: 190 10*3/uL (ref 150–400)
RBC: 4.64 MIL/uL (ref 3.87–5.11)
RDW: 15.7 % — ABNORMAL HIGH (ref 11.5–15.5)
WBC: 9.3 10*3/uL (ref 4.0–10.5)

## 2018-05-18 LAB — CMP (CANCER CENTER ONLY)
ALBUMIN: 3.4 g/dL — AB (ref 3.5–5.0)
ALK PHOS: 109 U/L (ref 38–126)
ALT: 14 U/L (ref 0–44)
ANION GAP: 8 (ref 5–15)
AST: 19 U/L (ref 15–41)
BUN: 30 mg/dL — ABNORMAL HIGH (ref 8–23)
CHLORIDE: 103 mmol/L (ref 98–111)
CO2: 29 mmol/L (ref 22–32)
Calcium: 9.1 mg/dL (ref 8.9–10.3)
Creatinine: 0.95 mg/dL (ref 0.44–1.00)
GFR, Est AFR Am: >60 mL/min (ref >60)
GFR, Estimated: >60 mL/min (ref >60)
Glucose, Bld: 98 mg/dL (ref 70–99)
POTASSIUM: 4.7 mmol/L (ref 3.5–5.1)
SODIUM: 140 mmol/L (ref 135–145)
Total Bilirubin: 0.5 mg/dL (ref 0.3–1.2)
Total Protein: 7.1 g/dL (ref 6.5–8.1)

## 2018-05-18 LAB — LACTATE DEHYDROGENASE: LDH: 168 U/L (ref 98–192)

## 2018-05-18 NOTE — Assessment & Plan Note (Signed)
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 positive for LCA, CD20, CD79a PAX-5, BCL-6 and weak cytoplasmic kappa associated with patchy weak positivity for BCL-2 and CD10, Ki-67 40%  Stage IIb(bilateral cervical, esophageal, mediastinal, supraclavicular nodes)  Bone marrow biopsy negative for diffuse large B cell lymphoma but suggestive of small low-grade B cell population possibly SLL/CLL  Revised IPI score: 1, good prognosis, redictated for years PFS 80%, predicted overall survival 79% ------------------------------------------------------------------------ Prior treatment: R CHOPX 5 cycles   TE Fistula:Repaired currently on PEG tube feeds esophageal stricture: Dilatation Being performed by gastroenterology 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  They will slowly improve over time. Pulmonary embolism:switchedher to Xarelto. Completed November 2019.  PET CT scan 11/16/2017: Resolution of metabolic adenopathy in the neck.  Return to clinic in 6 months with labs and follow-up and scans

## 2018-05-18 NOTE — Telephone Encounter (Signed)
Gave avs and calendar ° °

## 2018-05-24 NOTE — Patient Instructions (Addendum)
Kristen Escobar  05/24/2018   Your procedure is scheduled on: 05-31-18    Report to [facility] Main  Entrance    Report to Admitting at 8:00 AM    Call this number if you have problems the morning of surgery [phone]     Remember: Do not eat food or drink liquids :After Midnight.    BRUSH YOUR TEETH MORNING OF SURGERY AND RINSE YOUR MOUTH OUT, NO CHEWING GUM CANDY OR MINTS.     Take these medicines the morning of surgery with A SIP OF WATER: Omeprazole (Prilosec), and Levothyroxine (Synthroid)                                You may not have any metal on your body including hair pins and              piercings  Do not wear jewelry, make-up, lotions, powders or perfumes, deodorant             Do not wear nail polish.  Do not shave  48 hours prior to surgery.               Do not bring valuables to the hospital. Kristen Escobar.  Contacts, dentures or bridgework may not be worn into surgery.  Leave suitcase in the car. After surgery it may be brought to your room.     Patients discharged the day of surgery will not be allowed to drive home. IF YOU ARE HAVING SURGERY AND GOING HOME THE SAME DAY, YOU MUST HAVE AN ADULT TO DRIVE YOU HOME AND BE WITH YOU FOR 24 HOURS. YOU MAY GO HOME BY TAXI OR UBER OR ORTHERWISE, BUT AN ADULT MUST ACCOMPANY YOU HOME AND STAY WITH YOU FOR 24 HOURS.     Special Instructions: N/A              Please read over the following fact sheets you were given: _____________________________________________________________________             [facility] - Preparing for Surgery Before surgery, you can play an important role.  Because skin is not sterile, your skin needs to be as free of germs as possible.  You can reduce the number of germs on your skin by washing with CHG (chlorahexidine gluconate) soap before surgery.  CHG is an antiseptic cleaner which kills germs and bonds with the skin to  continue killing germs even after washing. Please DO NOT use if you have an allergy to CHG or antibacterial soaps.  If your skin becomes reddened/irritated stop using the CHG and inform your nurse when you arrive at Short Stay. Do not shave (including legs and underarms) for at least 48 hours prior to the first CHG shower.  You may shave your face/neck. Please follow these instructions carefully:  1.  Shower with CHG Soap the night before surgery and the  morning of Surgery.  2.  If you choose to wash your hair, wash your hair first as usual with your  normal  shampoo.  3.  After you shampoo, rinse your hair and body thoroughly to remove the  shampoo.  4.  Use CHG as you would any other liquid soap.  You can apply chg directly  to the skin and wash                       Gently with a scrungie or clean washcloth.  5.  Apply the CHG Soap to your body ONLY FROM THE NECK DOWN.   Do not use on face/ open                           Wound or open sores. Avoid contact with eyes, ears mouth and genitals (private parts).                       Wash face,  Genitals (private parts) with your normal soap.             6.  Wash thoroughly, paying special attention to the area where your surgery  will be performed.  7.  Thoroughly rinse your body with warm water from the neck down.  8.  DO NOT shower/wash with your normal soap after using and rinsing off  the CHG Soap.                9.  Pat yourself dry with a clean towel.            10.  Wear clean pajamas.            11.  Place clean sheets on your bed the night of your first shower and do not  sleep with pets. Day of Surgery : Do not apply any lotions/deodorants the morning of surgery.  Please wear clean clothes to the hospital/surgery center.  FAILURE TO FOLLOW THESE INSTRUCTIONS MAY RESULT IN THE CANCELLATION OF YOUR SURGERY PATIENT SIGNATURE_________________________________  NURSE  SIGNATURE__________________________________  ________________________________________________________________________   Kristen Escobar  An incentive spirometer is a tool that can help keep your lungs clear and active. This tool measures how well you are filling your lungs with each breath. Taking long deep breaths may help reverse or decrease the chance of developing breathing (pulmonary) problems (especially infection) following:  A long period of time when you are unable to move or be active. BEFORE THE PROCEDURE   If the spirometer includes an indicator to show your best effort, your nurse or respiratory therapist will set it to a desired goal.  If possible, sit up straight or lean slightly forward. Try not to slouch.  Hold the incentive spirometer in an upright position. INSTRUCTIONS FOR USE  1. Sit on the edge of your bed if possible, or sit up as far as you can in bed or on a chair. 2. Hold the incentive spirometer in an upright position. 3. Breathe out normally. 4. Place the mouthpiece in your mouth and seal your lips tightly around it. 5. Breathe in slowly and as deeply as possible, raising the piston or the ball toward the top of the column. 6. Hold your breath for 3-5 seconds or for as long as possible. Allow the piston or ball to fall to the bottom of the column. 7. Remove the mouthpiece from your mouth and breathe out normally. 8. Rest for a few seconds and repeat Steps 1 through 7 at least 10 times every 1-2 hours when you are awake. Take your time and take a few normal breaths between deep breaths. 9. The spirometer may include an indicator to show  your best effort. Use the indicator as a goal to work toward during each repetition. 10. After each set of 10 deep breaths, practice coughing to be sure your lungs are clear. If you have an incision (the cut made at the time of surgery), support your incision when coughing by placing a pillow or rolled up towels firmly  against it. Once you are able to get out of bed, walk around indoors and cough well. You may stop using the incentive spirometer when instructed by your caregiver.  RISKS AND COMPLICATIONS  Take your time so you do not get dizzy or light-headed.  If you are in pain, you may need to take or ask for pain medication before doing incentive spirometry. It is harder to take a deep breath if you are having pain. AFTER USE  Rest and breathe slowly and easily.  It can be helpful to keep track of a log of your progress. Your caregiver can provide you with a simple table to help with this. If you are using the spirometer at home, follow these instructions: [place] IF:   You are having difficultly using the spirometer.  You have trouble using the spirometer as often as instructed.  Your pain medication is not giving enough relief while using the spirometer.  You develop fever of 100.5 F (38.1 C) or higher. SEEK IMMEDIATE MEDICAL CARE IF:   You cough up bloody sputum that had not been present before.  You develop fever of 102 F (38.9 C) or greater.  You develop worsening pain at or near the incision site. MAKE SURE YOU:   Understand these instructions.  Will watch your condition.  Will get help right away if you are not doing well or get worse. Document Released: 08/18/2006 Document Revised: 06/30/2011 Document Reviewed: 10/19/2006 ExitCare Patient Information 2014 Exit[employer], [place].   ________________________________________________________________________  WHAT IS A BLOOD TRANSFUSION? Blood Transfusion Information  A transfusion is the replacement of blood or some of its parts. Blood is made up of multiple cells which provide different functions.  Red blood cells carry oxygen and are used for blood loss replacement.  White blood cells fight against infection.  Platelets control bleeding.  Plasma helps clot blood.  Other blood products are available for  specialized needs, such as hemophilia or other clotting disorders. BEFORE THE TRANSFUSION  Who gives blood for transfusions?   Healthy volunteers who are fully evaluated to make sure their blood is safe. This is blood bank blood. Transfusion therapy is the safest it has ever been in the practice of medicine. Before blood is taken from a donor, a complete history is taken to make sure that person has no history of diseases nor engages in risky social behavior (examples are intravenous drug use or sexual activity with multiple partners). The donor's travel history is screened to minimize risk of transmitting infections, such as malaria. The donated blood is tested for signs of infectious diseases, such as HIV and hepatitis. The blood is then tested to be sure it is compatible with you in order to minimize the chance of a transfusion reaction. If you or a relative donates blood, this is often done in anticipation of surgery and is not appropriate for emergency situations. It takes many days to process the donated blood. RISKS AND COMPLICATIONS Although transfusion therapy is very safe and saves many lives, the main dangers of transfusion include:   Getting an infectious disease.  Developing a transfusion reaction. This is an allergic reaction to  something in the blood you were given. Every precaution is taken to prevent this. The decision to have a blood transfusion has been considered carefully by your caregiver before blood is given. Blood is not given unless the benefits outweigh the risks. AFTER THE TRANSFUSION  Right after receiving a blood transfusion, you will usually feel much better and more energetic. This is especially true if your red blood cells have gotten low (anemic). The transfusion raises the level of the red blood cells which carry oxygen, and this usually causes an energy increase.  The nurse administering the transfusion will monitor you carefully for complications. HOME CARE  INSTRUCTIONS  No special instructions are needed after a transfusion. You may find your energy is better. Speak with your caregiver about any limitations on activity for underlying diseases you may have. SEEK MEDICAL CARE IF:   Your condition is not improving after your transfusion.  You develop redness or irritation at the intravenous (IV) site. SEEK IMMEDIATE MEDICAL CARE IF:  Any of the following symptoms occur over the next 12 hours:  Shaking chills.  You have a temperature by mouth above 102 F (38.9 C), not controlled by medicine.  Chest, back, or muscle pain.  People around you feel you are not acting correctly or are confused.  Shortness of breath or difficulty breathing.  Dizziness and fainting.  You get a rash or develop hives.  You have a decrease in urine output.  Your urine turns a dark color or changes to pink, red, or brown. Any of the following symptoms occur over the next 10 days:  You have a temperature by mouth above 102 F (38.9 C), not controlled by medicine.  Shortness of breath.  Weakness after normal activity.  The white part of the eye turns yellow (jaundice).  You have a decrease in the amount of urine or are urinating less often.  Your urine turns a dark color or changes to pink, red, or brown. Document Released: 04/04/2000 Document Revised: 06/30/2011 Document Reviewed: 11/22/2007 [facility] Patient Information 2014 [place], [place].  _______________________________________________________________________

## 2018-05-24 NOTE — Progress Notes (Addendum)
03-31-18 (Epic) EKG  04-01-18 Surgical Clearance from Dr. Elease Hashimoto on chart.  03-23-18 Surgical Clearance From Dr. Joya Gaskins, ENT provider  02-18-17 (Epic) ECHO   05-26-18 CMP result routed to Dr. Wynelle Link for review

## 2018-05-24 NOTE — H&P (Signed)
TOTAL KNEE ADMISSION H&P  Patient is being admitted for right total knee arthroplasty.  Subjective:  Chief Complaint:right knee pain.  HPI: Kristen Escobar, 73 y.o. female, has a history of pain and functional disability in the right knee due to arthritis and has failed non-surgical conservative treatments for greater than 12 weeks to includecorticosteriod injections, viscosupplementation injections and activity modification.  Onset of symptoms was gradual, starting 2 years ago with gradually worsening course since that time. The patient noted no past surgery on the right knee(s).  Patient currently rates pain in the right knee(s) at 9 out of 10 with activity. Patient has worsening of pain with activity and weight bearing, crepitus and instability.  Patient has evidence of bone-on-bone arthritis in the lateral and patellofemoral compartments with slight valgus deformity by imaging studies. There is no active infection.  Patient Active Problem List   Diagnosis Date Noted  . Buerger's disease ([place]) 03/31/2018  . Pressure injury of skin 03/13/2017  . Intracranial hemorrhage ([place]) 03/12/2017  . SDH (subdural hematoma) ([place]) 03/11/2017  . Uses feeding tube 03/11/2017  . Hyperglycemia 03/11/2017  . Left maxillary sinusitis 03/11/2017  . 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 Pulmonary embolus ([place]) 02/22/2017  . Port or reservoir infection 01/20/2017  . Port-A-Cath in place 01/20/2017  . 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 Neck mass 12/26/2016  . Esophageal mass 11/27/2016  . Hypothyroidism 03/25/2016  . Bilateral carotid bruits 08/22/2015  . Impaired glucose tolerance 08/22/2015  . Chronic kidney disease 08/22/2015  . Benign paroxysmal positional vertigo 02/05/2013  . S/P left THA, AA 01/20/2012  . Osteoarthritis 09/24/2011  . Breast cancer of upper-outer quadrant of right female breast ([place]) 03/21/2011  . Morbid obesity ([place]) 08/02/2009  . Essential hypertension  08/02/2009   Past Medical History:  Diagnosis Date  . Acute respiratory failure with hypoxia ([place]) 02/16/2017  . Arthritis   . Cancer ([place]) 03/2009   breast- rt  . Diffuse large B cell lymphoma ([place]) dx'd 02/17/17  . GERD (gastroesophageal reflux disease)   . History of radiation therapy 07/12/10,completed   right breast 60 Gy x30 fx  . Hypertension   . Hypothyroidism   . Obesity   . Peripheral vascular disease ([place]) 1995   PT DEVELOPED CIRCULATION PROBLEMS IN BOTH HANDS AND GANGRENE OF BOTH INDEX FINGERS--REQUIRING AMPUTATION OF THE INDEX FINGERS AND VASCULAR SURGERY.  PT TOLD HER PROBLEMS RELATED TO SMOKING.   NO OTHER PROBLEMS SINCE.  Marland Kitchen Personal history of radiation therapy 2012   Right Breast Cancer  . Pneumonia   . Thyroid mass     Past Surgical History:  Procedure Laterality Date  . AMPUTATION     partial amputation of both index fingers  . BREAST LUMPECTOMY Right 04/01/2010  . BREAST SURGERY  2011   lumpectomy with node sampling- RIGHT  . COLONOSCOPY    . ESOPHAGOGASTRODUODENOSCOPY    . EXCISION MASS NECK Left 12/26/2016   Procedure: EXCISION MASS NECK;  Surgeon: Izora Gala, MD;  Location: [facility];  Service: ENT;  Laterality: Left;  open excision of thyroid mass left side with frozen section  . HAND SURGERY Bilateral 1995   Amputaed pointer fingers bilaterally  . IR FLUORO GUIDE PORT INSERTION RIGHT  01/14/2017  . IR GASTROSTOMY TUBE MOD SED  02/19/2017  . IR REMOVAL TUN ACCESS W/ PORT W/O FL MOD SED  11/30/2017  . IR REPLACE G-TUBE SIMPLE WO FLUORO  08/03/2017  . IR REPLC GASTRO/COLONIC TUBE PERCUT W/FLUORO  02/25/2017  .  IR [place] TUBE PERCUT W/FLUORO  03/09/2017  . IR US GUIDE VASC ACCESS RIGHT  01/14/2017  . TOTAL HIP ARTHROPLASTY  12/16/2011   right hip  . TOTAL HIP ARTHROPLASTY  01/20/2012   Procedure: TOTAL HIP ARTHROPLASTY ANTERIOR APPROACH;  Surgeon: Mauri Pole, MD;  [facility];  Service: Orthopedics;  Laterality: Left;  . TUBAL LIGATION      . VASCULAR SURGERY     both hands    No current facility-administered medications for this encounter.    Current Outpatient Medications  Medication Sig Dispense Refill Last Dose  . furosemide (LASIX) 20 MG tablet TAKE 1 TABLET BY MOUTH TWICE A DAY (Patient taking differently: Place 20 mg into feeding tube daily. ) 180 tablet 1 Taking  . ibuprofen (ADVIL,MOTRIN) 200 MG tablet Place 400 mg into feeding tube daily as needed for moderate pain.     Marland Kitchen levothyroxine (SYNTHROID, LEVOTHROID) 50 MCG tablet TAKE 1 TABLET BY MOUTH ONCE DAILY (Patient taking differently: Place 50 mcg into feeding tube daily. ) 90 tablet 1 Taking  . loperamide (IMODIUM A-D) 2 MG tablet Place 2 mg into feeding tube 4 (four) times daily as needed for diarrhea or loose stools.   Taking  . omeprazole (PRILOSEC OTC) 20 MG tablet Take 20 mg per tube daily      No Known Allergies  Social History   Tobacco Use  . Smoking status: Former Smoker    Packs/day: 1.00    Years: 20.00    Pack years: 20.00    Last attempt to quit: 04/21/1993    Years since quitting: 25.1  . Smokeless tobacco: Never Used  Substance Use Topics  . Alcohol use: Yes    Comment: OCCAS - MAYBE ONCE A MONTH    Family History  Problem Relation Age of Onset  . Cancer Mother        pt unaware of what kind  . Hearing loss Mother   . Coronary artery disease Father   . Diabetes Father   . Coronary artery disease Brother   . Coronary artery disease Brother   . Diabetes Sister   . Cancer Sister        breast  . Colon cancer Neg Hx   . Stomach cancer Neg Hx   . Rectal cancer Neg Hx   . Esophageal cancer Neg Hx      Review of Systems  Constitutional: Negative for chills and fever.  HENT: Negative for congestion, sore throat and tinnitus.   Eyes: Negative for double vision, photophobia and pain.  Respiratory: Negative for cough, shortness of breath and wheezing.   Cardiovascular: Negative for chest pain, palpitations and orthopnea.   Gastrointestinal: Negative for heartburn, nausea and vomiting.  Genitourinary: Negative for dysuria, frequency and urgency.  Musculoskeletal: Positive for joint pain.  Neurological: Negative for dizziness, weakness and headaches.    Objective:  Physical Exam  Well nourished and well developed.  General: Alert and oriented x3, cooperative and pleasant, no acute distress.  Head: normocephalic, atraumatic, neck supple.  Eyes: EOMI.  Respiratory: breath sounds clear in all fields, no wheezing, rales, or rhonchi. Cardiovascular: Regular rate and rhythm, no murmurs, gallops or rubs.  Abdomen: non-tender to palpation and soft, normoactive bowel sounds. Musculoskeletal:  Right Knee Exam: No effusion. Valgus deformity. Range of motion is 10-120 degrees. Moderate crepitus on range of motion of the knee. Positive lateral, greater than medial, joint line tenderness.  Stable knee.  Calves soft and nontender. Motor function intact  in LE. Strength 5/5 LE bilaterally. Neuro: Distal pulses 2+. Sensation to light touch intact in LE.  Vital signs in last 24 hours:  Blood pressure: 118/98 mmHg  Labs:   Estimated body mass index is 35.17 kg/m as calculated from the following:   Height as of 05/18/18: 5\' 2"  (1.575 m).   Weight as of 05/18/18: 87.2 kg.   Imaging Review Plain radiographs demonstrate severe degenerative joint disease of the right knee(s). The overall alignment ismild valgus. The bone quality appears to be adequate for age and reported activity level.   Preoperative templating of the joint replacement has been completed, documented, and submitted to the Operating Room personnel in order to optimize intra-operative equipment management.   Anticipated LOS equal to or greater than 2 midnights due to - Age 75 and older with one or more of the following:  - Obesity  - Expected need for hospital services (PT, OT, Nursing) required for safe  discharge  - Anticipated need for  postoperative skilled nursing care or inpatient rehab  - Active co-morbidities: None OR   - Unanticipated findings during/Post Surgery: None  - Patient is a high risk of re-admission due to: None     Assessment/Plan:  End stage arthritis, right knee   The patient history, physical examination, clinical judgment of the provider and imaging studies are consistent with end stage degenerative joint disease of the right knee(s) and total knee arthroplasty is deemed medically necessary. The treatment options including medical management, injection therapy arthroscopy and arthroplasty were discussed at length. The risks and benefits of total knee arthroplasty were presented and reviewed. The risks due to aseptic loosening, infection, stiffness, patella tracking problems, thromboembolic complications and other imponderables were discussed. The patient acknowledged the explanation, agreed to proceed with the plan and consent was signed. Patient is being admitted for inpatient treatment for surgery, pain control, PT, OT, prophylactic antibiotics, VTE prophylaxis, progressive ambulation and ADL's and discharge planning. The patient is planning to be discharged home.  Therapy Plans: outpatient therapy at [facility] Disposition: Home with husband Planned DVT Prophylaxis: Xarelto 10 mg daily (hx PE) DME needed: None PCP: Carolann Littler, MD Otolaryngologist: Carlis Abbott, MD Oncologist: Nicholas Lose, MD TXA: Topical (hx PE) Allergies: NKDA Anesthesia Concerns: Airway narrowing (see otolaryngologist's note) BMI: 35.3  - Patient was instructed on what medications to stop prior to surgery. - Follow-up visit in 2 weeks with Dr. Wynelle Link - Begin physical therapy following surgery - Pre-operative lab work as pre-surgical testing - Prescriptions will be provided in hospital at time of discharge  Theresa Duty, PA-C Orthopedic Surgery [facility] [place]

## 2018-05-26 ENCOUNTER — Other Ambulatory Visit: Payer: Self-pay

## 2018-05-26 ENCOUNTER — Encounter (HOSPITAL_COMMUNITY): Payer: Self-pay

## 2018-05-26 ENCOUNTER — Encounter (HOSPITAL_COMMUNITY)
Admission: RE | Admit: 2018-05-26 | Discharge: 2018-05-26 | Disposition: A | Discharge status: Home / Self Care | Payer: Medicare Other | Source: Ambulatory Visit | Attending: Orthopedic Surgery | Admitting: Orthopedic Surgery

## 2018-05-26 DIAGNOSIS — M1711 Unilateral primary osteoarthritis, right knee: Secondary | ICD-10-CM | POA: Insufficient documentation

## 2018-05-26 DIAGNOSIS — Z01812 Encounter for preprocedural laboratory examination: Secondary | ICD-10-CM | POA: Diagnosis not present

## 2018-05-26 LAB — CBC WITH DIFFERENTIAL/PLATELET
Abs Immature Granulocytes: 0.04 10*3/uL (ref 0.00–0.07)
Basophils Absolute: 0.1 10*3/uL (ref 0.0–0.1)
Basophils Relative: 1 %
Eosinophils Absolute: 0.2 10*3/uL (ref 0.0–0.5)
Eosinophils Relative: 2 %
HCT: 43.8 % (ref 36.0–46.0)
Hemoglobin: 13.3 g/dL (ref 12.0–15.0)
Immature Granulocytes: 0 %
Lymphocytes Relative: 20 %
Lymphs Abs: 1.9 10*3/uL (ref 0.7–4.0)
MCH: 27.5 pg (ref 26.0–34.0)
MCHC: 30.4 g/dL (ref 30.0–36.0)
MCV: 90.7 fL (ref 80.0–100.0)
Monocytes Absolute: 0.8 10*3/uL (ref 0.1–1.0)
Monocytes Relative: 9 %
Neutro Abs: 6.2 10*3/uL (ref 1.7–7.7)
Neutrophils Relative %: 68 %
Platelets: 219 10*3/uL (ref 150–400)
RBC: 4.83 MIL/uL (ref 3.87–5.11)
RDW: 15.3 % (ref 11.5–15.5)
WBC: 9.2 10*3/uL (ref 4.0–10.5)
nRBC: 0 % (ref 0.0–0.2)

## 2018-05-26 LAB — URINALYSIS, ROUTINE W REFLEX MICROSCOPIC
Bilirubin Urine: NEGATIVE
Glucose, UA: NEGATIVE mg/dL
Hgb urine dipstick: NEGATIVE
Ketones, ur: NEGATIVE mg/dL
Leukocytes, UA: NEGATIVE
Nitrite: NEGATIVE
Protein, ur: NEGATIVE mg/dL
Specific Gravity, Urine: 1.011 (ref 1.005–1.030)
pH: 7 (ref 5.0–8.0)

## 2018-05-26 LAB — PROTIME-INR
INR: 1.02
Prothrombin Time: 13.3 seconds (ref 11.4–15.2)

## 2018-05-26 LAB — COMPREHENSIVE METABOLIC PANEL
ALT: 16 U/L (ref 0–44)
AST: 25 U/L (ref 15–41)
Albumin: 4 g/dL (ref 3.5–5.0)
Alkaline Phosphatase: 102 U/L (ref 38–126)
Anion gap: 10 (ref 5–15)
BUN: 31 mg/dL — ABNORMAL HIGH (ref 8–23)
CO2: 28 mmol/L (ref 22–32)
Calcium: 9.5 mg/dL (ref 8.9–10.3)
Chloride: 102 mmol/L (ref 98–111)
Creatinine, Ser: 0.96 mg/dL (ref 0.44–1.00)
GFR calc Af Amer: >60 mL/min (ref >60)
GFR calc non Af Amer: 60 mL/min — ABNORMAL LOW (ref >60)
Glucose, Bld: 129 mg/dL — ABNORMAL HIGH (ref 70–99)
Potassium: 4.7 mmol/L (ref 3.5–5.1)
Sodium: 140 mmol/L (ref 135–145)
Total Bilirubin: 0.7 mg/dL (ref 0.3–1.2)
Total Protein: 7.8 g/dL (ref 6.5–8.1)

## 2018-05-26 LAB — SURGICAL PCR SCREEN
MRSA, PCR: NEGATIVE
Staphylococcus aureus: NEGATIVE

## 2018-05-26 LAB — APTT: aPTT: 27 seconds (ref 24–36)

## 2018-05-26 NOTE — Progress Notes (Signed)
PCP:Dr. Elease Hashimoto  CARDIOLOGIST:  INFO IN Epic  Current Labs  :03-31-18 (Epic) EKG    02-18-17 (Epic) ECHO  INFO ON CHART:  04-01-18 Surgical Clearance from Dr. Elease Hashimoto on chart.  03-23-18 Surgical Clearance From Dr. Joya Gaskins, ENT provider   BLOOD THINNERS AND LAST DOSES :N/A ____________________________________  Hx of:   HTN,   PE,   Tracheal-esophageal Fistula,   Esophageal mass,    Gastrostomy tube present  Breast cancer

## 2018-05-27 NOTE — Progress Notes (Signed)
Anesthesia Chart Review   Case:  [id number] Date/Time:  05/31/18 1015   Procedure:  TOTAL KNEE ARTHROPLASTY (Right ) - 33min   Anesthesia type:  Choice   Pre-op diagnosis:  right knee osteoarthritis   [facility]  [facility]OM 10 / [facility]   Surgeon:  Gaynelle Arabian, MD      DISCUSSION: 73 yo former smoker (20 pack years, quit 04/21/93) with h/o HTN, PVD, h/o PE (01/2017, completed Xarelto 02/2018),  hypothyroidism, GERD, right breast cancer s/p radiation 2012, right knee OA scheduled for above surgery on 05/31/18 with Dr. Gaynelle Arabian.   Recently underwent traceoesophageal fistula repair, esophageal dilation by Dr. Carlis Abbott 05/13/18.  Advised to follow up in 3 weeks for repeat swallow study.  Per Oncologist, Dr. Nicholas Lose, pt on PEG tube feeds.  Prior to procedure clearance received from Dr. Donald Pore which states pt is moderate risk due to pinpoint tracheo-esophageal fistula on posterior wall of trachea in the subglottis, should not cause issue with intubation.  Discussed with Dr. Therisa Doyne.    Clearance from PCP received 04/01/18 which states pt is moderate risk due to age, obesity, and h/o pulmonary embolism.   Pt can proceed with planned procedure barring acute status change.  VS: BP (!) 166/63   Pulse 77   Temp 36.6 C (Oral)   Resp 18   Ht 5\' 2"  (1.575 m)   Wt 86.2 kg   SpO2 99%   BMI 34.75 kg/m   PROVIDERS: Eulas Post, MD is PCP   Nicholas Lose, MD is Medical Oncologist  Sticker, Antony Haste is Otolaryngologist  LABS: Labs reviewed: Acceptable for surgery. (all labs ordered are listed, but only abnormal results are displayed)  Labs Reviewed  COMPREHENSIVE METABOLIC PANEL - Abnormal; Notable for the following components:      Result Value   Glucose, Bld 129 (*)    BUN 31 (*)    GFR calc non Af Amer 60 (*)    All other components within normal limits  URINALYSIS, ROUTINE W REFLEX MICROSCOPIC - Abnormal; Notable for the following components:   APPearance HAZY (*)    All  other components within normal limits  SURGICAL PCR SCREEN  APTT  CBC WITH DIFFERENTIAL/PLATELET  PROTIME-INR  TYPE AND SCREEN     IMAGES:   EKG: 03/31/18  Rate 68 bpm Sinus rhythm Anterolateral ST elevation-repolarization variant Negative precordial T waves  CV: Echo 02/18/17 Study Conclusions  - Left ventricle: The cavity size was normal. Wall thickness was   increased in a pattern of mild LVH. Systolic function was normal.   The estimated ejection fraction was in the range of 60% to 65%.   Wall motion was normal; there were no regional wall motion   abnormalities. Doppler parameters are consistent with abnormal   left ventricular relaxation (grade 1 diastolic dysfunction). The   E/e&' ratio is <8, suggesting normal LV filling pressure. - Left atrium: The atrium was mildly dilated. - Right ventricle: The cavity size was normal. Wall thickness was   normal. The moderator band was prominent. Systolic function was   normal. - Right atrium: The atrium was at the upper limits of normal in   size. - Tricuspid valve: There was trivial regurgitation. - Pulmonary arteries: PA peak pressure: 27 mm Hg (S). - Inferior vena cava: The vessel was normal in size. The   respirophasic diameter changes were in the normal range (>= 50%),   consistent with normal central venous pressure.  Impressions:  - Compared  to a prior study in 12/2016, there are no significant   changes.  Carotid doppler 08/30/15 Heterogenous plaque in the carotid bifurcations.  40-59% bilateral ICA stenosis Patent vertebral arteries with antegrade flow Normal subclavian arteries, bilaterally.  Stable, repeat in 1 year per Dr. Burnice Logan Past Medical History:  Diagnosis Date  . Acute respiratory failure with hypoxia ([place]) 02/16/2017  . Arthritis   . Cancer ([place]) 03/2009   breast- rt  . Diffuse large B cell lymphoma ([place]) dx'd 02/17/17  . GERD (gastroesophageal reflux disease)   . History of radiation  therapy 07/12/10,completed   right breast 60 Gy x30 fx  . Hypertension   . Hypothyroidism   . Obesity   . Peripheral vascular disease ([place]) 1995   PT DEVELOPED CIRCULATION PROBLEMS IN BOTH HANDS AND GANGRENE OF BOTH INDEX FINGERS--REQUIRING AMPUTATION OF THE INDEX FINGERS AND VASCULAR SURGERY.  PT TOLD HER PROBLEMS RELATED TO SMOKING.   NO OTHER PROBLEMS SINCE.  Marland Kitchen Personal history of radiation therapy 2012   Right Breast Cancer  . Pneumonia   . Thyroid mass     Past Surgical History:  Procedure Laterality Date  . AMPUTATION     partial amputation of both index fingers  . BREAST LUMPECTOMY Right 04/01/2010  . BREAST SURGERY  2011   lumpectomy with node sampling- RIGHT  . COLONOSCOPY    . ESOPHAGOGASTRODUODENOSCOPY    . EXCISION MASS NECK Left 12/26/2016   Procedure: EXCISION MASS NECK;  Surgeon: Izora Gala, MD;  Location: [facility];  Service: ENT;  Laterality: Left;  open excision of thyroid mass left side with frozen section  . HAND SURGERY Bilateral 1995   Amputaed pointer fingers bilaterally  . IR FLUORO GUIDE PORT INSERTION RIGHT  01/14/2017  . IR GASTROSTOMY TUBE MOD SED  02/19/2017  . IR REMOVAL TUN ACCESS W/ PORT W/O FL MOD SED  11/30/2017  . IR REPLACE G-TUBE SIMPLE WO FLUORO  08/03/2017  . IR REPLC GASTRO/COLONIC TUBE PERCUT W/FLUORO  02/25/2017  . IR [place] TUBE PERCUT W/FLUORO  03/09/2017  . IR US GUIDE VASC ACCESS RIGHT  01/14/2017  . TOTAL HIP ARTHROPLASTY  12/16/2011   right hip  . TOTAL HIP ARTHROPLASTY  01/20/2012   Procedure: TOTAL HIP ARTHROPLASTY ANTERIOR APPROACH;  Surgeon: Mauri Pole, MD;  [facility];  Service: Orthopedics;  Laterality: Left;  . TUBAL LIGATION    . VASCULAR SURGERY     both hands    MEDICATIONS: . furosemide (LASIX) 20 MG tablet  . ibuprofen (ADVIL,MOTRIN) 200 MG tablet  . levothyroxine (SYNTHROID, LEVOTHROID) 50 MCG tablet  . loperamide (IMODIUM A-D) 2 MG tablet  . omeprazole (PRILOSEC OTC) 20 MG tablet   No current  facility-administered medications for this encounter.      Maia Plan [facility] Pre-Surgical Testing [phone] 05/27/18 1:13 PM

## 2018-05-27 NOTE — Anesthesia Preprocedure Evaluation (Addendum)
Anesthesia Evaluation  Patient identified by MRN, date of birth, ID band Patient awake    Reviewed: Allergy & Precautions, NPO status , Patient's Chart, lab work & pertinent test results  Airway Mallampati: II  TM Distance: >3 FB Neck ROM: Full    Dental  (+) Dental Advisory Given   Pulmonary pneumonia, former smoker,    Pulmonary exam normal breath sounds clear to auscultation       Cardiovascular hypertension, + Peripheral Vascular Disease  Normal cardiovascular exam Rhythm:Regular Rate:Normal     Neuro/Psych negative neurological ROS  negative psych ROS   GI/Hepatic Neg liver ROS, GERD  ,  Endo/Other  Hypothyroidism   Renal/GU Renal disease     Musculoskeletal  (+) Arthritis ,   Abdominal (+) + obese,   Peds  Hematology negative hematology ROS (+)   Anesthesia Other Findings   Reproductive/Obstetrics negative OB ROS                                                            Anesthesia Evaluation  Patient identified by MRN, date of birth, ID band Patient awake    Reviewed: Allergy & Precautions, NPO status , Patient's Chart, lab work & pertinent test results  Airway Mallampati: II  TM Distance: >3 FB     Dental   Pulmonary pneumonia, former smoker,    breath sounds clear to auscultation       Cardiovascular hypertension, + Peripheral Vascular Disease   Rhythm:Regular Rate:Normal     Neuro/Psych    GI/Hepatic Neg liver ROS, GERD  ,  Endo/Other  Hypothyroidism   Renal/GU Renal disease     Musculoskeletal  (+) Arthritis ,   Abdominal   Peds  Hematology   Anesthesia Other Findings   Reproductive/Obstetrics                             Anesthesia Physical Anesthesia Plan  ASA: III  Anesthesia Plan: General   Post-op Pain Management:    Induction: Intravenous  PONV Risk Score and Plan: 3 and Ondansetron,  Dexamethasone, Midazolam, Propofol infusion and Treatment may vary due to age or medical condition  Airway Management Planned: Oral ETT  Additional Equipment:   Intra-op Plan:   Post-operative Plan: Possible Post-op intubation/ventilation  Informed Consent: I have reviewed the patients History and Physical, chart, labs and discussed the procedure including the risks, benefits and alternatives for the proposed anesthesia with the patient or authorized representative who has indicated his/her understanding and acceptance.   Dental advisory given  Plan Discussed with: CRNA, Anesthesiologist and Surgeon  Anesthesia Plan Comments:         Anesthesia Quick Evaluation  Anesthesia Physical Anesthesia Plan  ASA: III  Anesthesia Plan: Spinal   Post-op Pain Management:    Induction: Intravenous  PONV Risk Score and Plan: 3 and Ondansetron, Propofol infusion, Treatment may vary due to age or medical condition and TIVA  Airway Management Planned: Natural Airway  Additional Equipment: None  Intra-op Plan:   Post-operative Plan:   Informed Consent: I have reviewed the patients History and Physical, chart, labs and discussed the procedure including the risks, benefits and alternatives for the proposed anesthesia with the patient or authorized representative who has indicated his/her understanding and acceptance.  Dental advisory given  Plan Discussed with: CRNA  Anesthesia Plan Comments: (See PST note 05/26/18, Konrad Felix, PA-C)     Anesthesia Quick Evaluation

## 2018-05-30 MED ORDER — BUPIVACAINE LIPOSOME 1.3 % IJ SUSP
20.0000 mL | Freq: Once | INTRAMUSCULAR | Status: DC
  Filled 2018-05-30: qty 20

## 2018-05-31 ENCOUNTER — Inpatient Hospital Stay (HOSPITAL_COMMUNITY): Payer: Medicare Other | Admitting: Physician Assistant

## 2018-05-31 ENCOUNTER — Inpatient Hospital Stay (HOSPITAL_COMMUNITY)
Admission: RE | Admit: 2018-05-31 | Discharge: 2018-06-02 | DRG: 470 | Disposition: A | Discharge status: Home / Self Care | Payer: Medicare Other | Attending: Orthopedic Surgery | Admitting: Orthopedic Surgery

## 2018-05-31 ENCOUNTER — Encounter (HOSPITAL_COMMUNITY)
Admission: RE | Disposition: A | Discharge status: Home / Self Care | Payer: Self-pay | Source: Home / Self Care | Attending: Orthopedic Surgery

## 2018-05-31 ENCOUNTER — Other Ambulatory Visit: Payer: Self-pay

## 2018-05-31 ENCOUNTER — Encounter (HOSPITAL_COMMUNITY): Payer: Self-pay

## 2018-05-31 ENCOUNTER — Inpatient Hospital Stay (HOSPITAL_COMMUNITY): Payer: Medicare Other | Admitting: Anesthesiology

## 2018-05-31 DIAGNOSIS — I129 Hypertensive chronic kidney disease with stage 1 through stage 4 chronic kidney disease, or unspecified chronic kidney disease: Secondary | ICD-10-CM | POA: Diagnosis present

## 2018-05-31 DIAGNOSIS — I739 Peripheral vascular disease, unspecified: Secondary | ICD-10-CM | POA: Diagnosis present

## 2018-05-31 DIAGNOSIS — M1710 Unilateral primary osteoarthritis, unspecified knee: Secondary | ICD-10-CM

## 2018-05-31 DIAGNOSIS — M1711 Unilateral primary osteoarthritis, right knee: Principal | ICD-10-CM | POA: Diagnosis present

## 2018-05-31 DIAGNOSIS — K219 Gastro-esophageal reflux disease without esophagitis: Secondary | ICD-10-CM | POA: Diagnosis present

## 2018-05-31 DIAGNOSIS — Z96643 Presence of artificial hip joint, bilateral: Secondary | ICD-10-CM | POA: Diagnosis present

## 2018-05-31 DIAGNOSIS — G8918 Other acute postprocedural pain: Secondary | ICD-10-CM | POA: Diagnosis not present

## 2018-05-31 DIAGNOSIS — Z79899 Other long term (current) drug therapy: Secondary | ICD-10-CM | POA: Diagnosis not present

## 2018-05-31 DIAGNOSIS — C8330 Diffuse large B-cell lymphoma, unspecified site: Secondary | ICD-10-CM | POA: Diagnosis present

## 2018-05-31 DIAGNOSIS — Z853 Personal history of malignant neoplasm of breast: Secondary | ICD-10-CM

## 2018-05-31 DIAGNOSIS — Z87891 Personal history of nicotine dependence: Secondary | ICD-10-CM | POA: Diagnosis not present

## 2018-05-31 DIAGNOSIS — Z79890 Hormone replacement therapy (postmenopausal): Secondary | ICD-10-CM | POA: Diagnosis not present

## 2018-05-31 DIAGNOSIS — Z86711 Personal history of pulmonary embolism: Secondary | ICD-10-CM | POA: Diagnosis not present

## 2018-05-31 DIAGNOSIS — E039 Hypothyroidism, unspecified: Secondary | ICD-10-CM | POA: Diagnosis present

## 2018-05-31 DIAGNOSIS — Z833 Family history of diabetes mellitus: Secondary | ICD-10-CM | POA: Diagnosis not present

## 2018-05-31 DIAGNOSIS — I10 Essential (primary) hypertension: Secondary | ICD-10-CM | POA: Diagnosis not present

## 2018-05-31 DIAGNOSIS — M179 Osteoarthritis of knee, unspecified: Secondary | ICD-10-CM

## 2018-05-31 DIAGNOSIS — Z923 Personal history of irradiation: Secondary | ICD-10-CM

## 2018-05-31 HISTORY — PX: TOTAL KNEE ARTHROPLASTY: SHX125

## 2018-05-31 LAB — TYPE AND SCREEN
ABO/RH(D): A POS
Antibody Screen: NEGATIVE

## 2018-05-31 SURGERY — ARTHROPLASTY, KNEE, TOTAL
Anesthesia: Spinal | Site: Knee | Laterality: Right

## 2018-05-31 MED ORDER — BUPIVACAINE IN DEXTROSE 0.75-8.25 % IT SOLN
INTRATHECAL | Status: DC | PRN
  Administered 2018-05-31: 1.6 mL via INTRATHECAL

## 2018-05-31 MED ORDER — BUPIVACAINE HCL (PF) 0.5 % IJ SOLN
INTRAMUSCULAR | Status: DC | PRN
  Administered 2018-05-31: 20 mL via PERINEURAL

## 2018-05-31 MED ORDER — PROPOFOL 10 MG/ML IV BOLUS
INTRAVENOUS | Status: AC
  Filled 2018-05-31: qty 20

## 2018-05-31 MED ORDER — MIDAZOLAM HCL 2 MG/2ML IJ SOLN
1.0000 mg | INTRAMUSCULAR | Status: DC
  Administered 2018-05-31: 1 mg via INTRAVENOUS
  Filled 2018-05-31: qty 2

## 2018-05-31 MED ORDER — DEXAMETHASONE SODIUM PHOSPHATE 10 MG/ML IJ SOLN
10.0000 mg | Freq: Once | INTRAMUSCULAR | Status: AC
  Administered 2018-06-01: 10 mg via INTRAVENOUS
  Filled 2018-05-31: qty 1

## 2018-05-31 MED ORDER — METHOCARBAMOL 500 MG IVPB - SIMPLE MED
500.0000 mg | Freq: Four times a day (QID) | INTRAVENOUS | Status: DC | PRN
  Administered 2018-05-31 – 2018-06-01 (×2): 500 mg via INTRAVENOUS
  Filled 2018-05-31: qty 500
  Filled 2018-05-31 (×2): qty 50
  Filled 2018-05-31: qty 500

## 2018-05-31 MED ORDER — CHLORHEXIDINE GLUCONATE 4 % EX LIQD: 60.0000 mL | Freq: Once | CUTANEOUS | Status: DC

## 2018-05-31 MED ORDER — TRANEXAMIC ACID 1000 MG/10ML IV SOLN
2000.0000 mg | Freq: Once | INTRAVENOUS | Status: DC
  Filled 2018-05-31: qty 20

## 2018-05-31 MED ORDER — LOPERAMIDE HCL 1 MG/7.5ML PO SUSP
2.0000 mg | Freq: Four times a day (QID) | ORAL | Status: DC | PRN
  Filled 2018-05-31: qty 15

## 2018-05-31 MED ORDER — PHENOL 1.4 % MT LIQD: 1.0000 | OROMUCOSAL | Status: DC | PRN

## 2018-05-31 MED ORDER — CEFAZOLIN SODIUM-DEXTROSE 2-4 GM/100ML-% IV SOLN
2.0000 g | Freq: Four times a day (QID) | INTRAVENOUS | Status: AC
  Administered 2018-05-31 (×2): 2 g via INTRAVENOUS
  Filled 2018-05-31 (×2): qty 100

## 2018-05-31 MED ORDER — FENTANYL CITRATE (PF) 100 MCG/2ML IJ SOLN
INTRAMUSCULAR | Status: AC
  Filled 2018-05-31: qty 2

## 2018-05-31 MED ORDER — SUCCINYLCHOLINE CHLORIDE 200 MG/10ML IV SOSY
PREFILLED_SYRINGE | INTRAVENOUS | Status: AC
  Filled 2018-05-31: qty 10

## 2018-05-31 MED ORDER — METOCLOPRAMIDE HCL 5 MG PO TABS: 5.0000 mg | ORAL_TABLET | Freq: Three times a day (TID) | ORAL | Status: DC | PRN

## 2018-05-31 MED ORDER — STERILE WATER FOR IRRIGATION IR SOLN
Status: DC | PRN
  Administered 2018-05-31: 2000 mL

## 2018-05-31 MED ORDER — DIPHENHYDRAMINE HCL 12.5 MG/5ML PO ELIX: 12.5000 mg | ORAL_SOLUTION | ORAL | Status: DC | PRN

## 2018-05-31 MED ORDER — DEXAMETHASONE SODIUM PHOSPHATE 10 MG/ML IJ SOLN
10.0000 mg | Freq: Once | INTRAMUSCULAR | Status: AC
  Administered 2018-05-31: 10 mg via INTRAVENOUS

## 2018-05-31 MED ORDER — BUPIVACAINE LIPOSOME 1.3 % IJ SUSP
INTRAMUSCULAR | Status: DC | PRN
  Administered 2018-05-31: 20 mL

## 2018-05-31 MED ORDER — SODIUM CHLORIDE (PF) 0.9 % IJ SOLN
INTRAMUSCULAR | Status: AC
  Filled 2018-05-31: qty 50

## 2018-05-31 MED ORDER — CEFAZOLIN SODIUM-DEXTROSE 2-4 GM/100ML-% IV SOLN
2.0000 g | INTRAVENOUS | Status: AC
  Administered 2018-05-31: 2 g via INTRAVENOUS
  Filled 2018-05-31: qty 100

## 2018-05-31 MED ORDER — FUROSEMIDE 20 MG PO TABS
20.0000 mg | ORAL_TABLET | Freq: Every day | ORAL | Status: DC
  Administered 2018-06-01 – 2018-06-02 (×2): 20 mg
  Filled 2018-05-31 (×2): qty 1

## 2018-05-31 MED ORDER — LEVOTHYROXINE SODIUM 50 MCG PO TABS
50.0000 ug | ORAL_TABLET | Freq: Every day | ORAL | Status: DC
  Administered 2018-06-01 – 2018-06-02 (×2): 50 ug
  Filled 2018-05-31 (×2): qty 1

## 2018-05-31 MED ORDER — SODIUM CHLORIDE 0.9 % IR SOLN
Status: DC | PRN
  Administered 2018-05-31: 1000 mL

## 2018-05-31 MED ORDER — PROPOFOL 500 MG/50ML IV EMUL
INTRAVENOUS | Status: DC | PRN
  Administered 2018-05-31: 50 ug/kg/min via INTRAVENOUS

## 2018-05-31 MED ORDER — OXYCODONE HCL 5 MG PO TABS
5.0000 mg | ORAL_TABLET | ORAL | Status: DC | PRN
  Filled 2018-05-31: qty 1

## 2018-05-31 MED ORDER — ACETAMINOPHEN 160 MG/5ML PO SOLN
1000.0000 mg | Freq: Four times a day (QID) | ORAL | Status: AC
  Administered 2018-05-31 – 2018-06-01 (×3): 1000 mg via ORAL
  Filled 2018-05-31 (×3): qty 40.6

## 2018-05-31 MED ORDER — FLEET ENEMA 7-19 GM/118ML RE ENEM: 1.0000 | ENEMA | Freq: Once | RECTAL | Status: DC | PRN

## 2018-05-31 MED ORDER — POLYETHYLENE GLYCOL 3350 17 G PO PACK: 17.0000 g | PACK | Freq: Every day | ORAL | Status: DC | PRN

## 2018-05-31 MED ORDER — ONDANSETRON HCL 4 MG/2ML IJ SOLN: 4.0000 mg | Freq: Four times a day (QID) | INTRAMUSCULAR | Status: DC | PRN

## 2018-05-31 MED ORDER — SODIUM CHLORIDE 0.9 % IV SOLN
INTRAVENOUS | Status: DC
  Administered 2018-05-31: 14:00:00 via INTRAVENOUS

## 2018-05-31 MED ORDER — METOCLOPRAMIDE HCL 5 MG/ML IJ SOLN: 5.0000 mg | Freq: Three times a day (TID) | INTRAMUSCULAR | Status: DC | PRN

## 2018-05-31 MED ORDER — METHOCARBAMOL 500 MG PO TABS
500.0000 mg | ORAL_TABLET | Freq: Four times a day (QID) | ORAL | Status: DC | PRN
  Filled 2018-05-31: qty 1

## 2018-05-31 MED ORDER — FENTANYL CITRATE (PF) 100 MCG/2ML IJ SOLN
50.0000 ug | INTRAMUSCULAR | Status: DC
  Administered 2018-05-31: 50 ug via INTRAVENOUS
  Filled 2018-05-31: qty 2

## 2018-05-31 MED ORDER — OSMOLITE 1.5 CAL PO LIQD
1000.0000 mL | ORAL | Status: DC
  Administered 2018-05-31: 1000 mL
  Filled 2018-05-31 (×6): qty 1000

## 2018-05-31 MED ORDER — OMEPRAZOLE 20 MG PO CPDR
20.0000 mg | DELAYED_RELEASE_CAPSULE | Freq: Every day | ORAL | Status: DC
  Administered 2018-06-01 – 2018-06-02 (×2): 20 mg via ORAL
  Filled 2018-05-31 (×2): qty 1

## 2018-05-31 MED ORDER — EPHEDRINE 5 MG/ML INJ
INTRAVENOUS | Status: AC
  Filled 2018-05-31: qty 10

## 2018-05-31 MED ORDER — PROMETHAZINE HCL 25 MG/ML IJ SOLN: 6.2500 mg | INTRAMUSCULAR | Status: DC | PRN

## 2018-05-31 MED ORDER — MENTHOL 3 MG MT LOZG: 1.0000 | LOZENGE | OROMUCOSAL | Status: DC | PRN

## 2018-05-31 MED ORDER — OXYCODONE HCL 5 MG/5ML PO SOLN
5.0000 mg | ORAL | Status: DC | PRN
  Administered 2018-05-31 – 2018-06-01 (×2): 5 mg via ORAL
  Filled 2018-05-31: qty 10
  Filled 2018-05-31: qty 5

## 2018-05-31 MED ORDER — PRO-STAT SUGAR FREE PO LIQD: 30.0000 mL | Freq: Two times a day (BID) | ORAL | Status: DC

## 2018-05-31 MED ORDER — ACETAMINOPHEN 10 MG/ML IV SOLN
1000.0000 mg | Freq: Four times a day (QID) | INTRAVENOUS | Status: DC
  Administered 2018-05-31: 1000 mg via INTRAVENOUS
  Filled 2018-05-31: qty 100

## 2018-05-31 MED ORDER — MORPHINE SULFATE (PF) 4 MG/ML IV SOLN: 1.0000 mg | INTRAVENOUS | Status: DC | PRN

## 2018-05-31 MED ORDER — ACETAMINOPHEN 500 MG PO TABS: 1000.0000 mg | ORAL_TABLET | Freq: Four times a day (QID) | ORAL | Status: DC

## 2018-05-31 MED ORDER — EPHEDRINE SULFATE-NACL 50-0.9 MG/10ML-% IV SOSY
PREFILLED_SYRINGE | INTRAVENOUS | Status: DC | PRN
  Administered 2018-05-31 (×4): 10 mg via INTRAVENOUS

## 2018-05-31 MED ORDER — DOCUSATE SODIUM 50 MG/5ML PO LIQD
100.0000 mg | Freq: Two times a day (BID) | ORAL | Status: DC
  Administered 2018-05-31 – 2018-06-02 (×3): 100 mg
  Filled 2018-05-31 (×4): qty 10

## 2018-05-31 MED ORDER — ONDANSETRON HCL 4 MG/2ML IJ SOLN
INTRAMUSCULAR | Status: DC | PRN
  Administered 2018-05-31: 4 mg via INTRAVENOUS

## 2018-05-31 MED ORDER — LIDOCAINE 2% (20 MG/ML) 5 ML SYRINGE
INTRAMUSCULAR | Status: AC
  Filled 2018-05-31: qty 5

## 2018-05-31 MED ORDER — RIVAROXABAN 10 MG PO TABS
10.0000 mg | ORAL_TABLET | Freq: Every day | ORAL | Status: DC
  Administered 2018-06-01 – 2018-06-02 (×2): 10 mg via ORAL
  Filled 2018-05-31 (×2): qty 1

## 2018-05-31 MED ORDER — LOPERAMIDE HCL 2 MG PO TABS: 2.0000 mg | ORAL_TABLET | Freq: Four times a day (QID) | ORAL | Status: DC | PRN

## 2018-05-31 MED ORDER — LACTATED RINGERS IV SOLN
INTRAVENOUS | Status: DC
  Administered 2018-05-31 (×2): via INTRAVENOUS

## 2018-05-31 MED ORDER — PROPOFOL 10 MG/ML IV BOLUS
INTRAVENOUS | Status: DC | PRN
  Administered 2018-05-31 (×3): 10 mg via INTRAVENOUS

## 2018-05-31 MED ORDER — DOCUSATE SODIUM 100 MG PO CAPS: 100.0000 mg | ORAL_CAPSULE | Freq: Two times a day (BID) | ORAL | Status: DC

## 2018-05-31 MED ORDER — GABAPENTIN 300 MG PO CAPS
300.0000 mg | ORAL_CAPSULE | Freq: Once | ORAL | Status: DC
  Filled 2018-05-31: qty 1

## 2018-05-31 MED ORDER — DEXAMETHASONE SODIUM PHOSPHATE 10 MG/ML IJ SOLN
INTRAMUSCULAR | Status: AC
  Filled 2018-05-31: qty 1

## 2018-05-31 MED ORDER — SODIUM CHLORIDE (PF) 0.9 % IJ SOLN
INTRAMUSCULAR | Status: DC | PRN
  Administered 2018-05-31: 60 mL

## 2018-05-31 MED ORDER — MEPERIDINE HCL 50 MG/ML IJ SOLN: 6.2500 mg | INTRAMUSCULAR | Status: DC | PRN

## 2018-05-31 MED ORDER — SODIUM CHLORIDE (PF) 0.9 % IJ SOLN
INTRAMUSCULAR | Status: AC
  Filled 2018-05-31: qty 10

## 2018-05-31 MED ORDER — TRANEXAMIC ACID 1000 MG/10ML IV SOLN
INTRAVENOUS | Status: DC | PRN
  Administered 2018-05-31: 2000 mg via TOPICAL

## 2018-05-31 MED ORDER — BISACODYL 10 MG RE SUPP: 10.0000 mg | Freq: Every day | RECTAL | Status: DC | PRN

## 2018-05-31 MED ORDER — ONDANSETRON HCL 4 MG/2ML IJ SOLN
INTRAMUSCULAR | Status: AC
  Filled 2018-05-31: qty 2

## 2018-05-31 MED ORDER — ONDANSETRON HCL 4 MG PO TABS: 4.0000 mg | ORAL_TABLET | Freq: Four times a day (QID) | ORAL | Status: DC | PRN

## 2018-05-31 MED ORDER — FENTANYL CITRATE (PF) 100 MCG/2ML IJ SOLN: 25.0000 ug | INTRAMUSCULAR | Status: DC | PRN

## 2018-05-31 MED ORDER — TRAMADOL HCL 50 MG PO TABS
50.0000 mg | ORAL_TABLET | Freq: Four times a day (QID) | ORAL | Status: DC | PRN
  Administered 2018-06-01 (×2): 100 mg via ORAL
  Administered 2018-06-02 (×2): 50 mg via ORAL
  Filled 2018-05-31: qty 1
  Filled 2018-05-31: qty 2
  Filled 2018-05-31: qty 1

## 2018-05-31 SURGICAL SUPPLY — 59 items
ATTUNE PS FEM RT SZ 5 CEM KNEE (Femur) ×3 IMPLANT
ATTUNE PSRP INSR SZ5 8 KNEE (Insert) ×2 IMPLANT
ATTUNE PSRP INSR SZ5 8MM KNEE (Insert) ×1 IMPLANT
BAG ZIPLOCK 12X15 (MISCELLANEOUS) ×3 IMPLANT
BANDAGE ACE 6X5 VEL STRL LF (GAUZE/BANDAGES/DRESSINGS) ×3 IMPLANT
BASEPLATE TIBIAL ROTATING SZ 4 (Knees) ×3 IMPLANT
BLADE SAG 18X100X1.27 (BLADE) ×3 IMPLANT
BLADE SAW SGTL 11.0X1.19X90.0M (BLADE) ×3 IMPLANT
BLADE SURG SZ10 CARB STEEL (BLADE) ×6 IMPLANT
BOWL SMART MIX CTS (DISPOSABLE) ×3 IMPLANT
CEMENT HV SMART SET (Cement) ×6 IMPLANT
CLOSURE WOUND 1/2 X4 (GAUZE/BANDAGES/DRESSINGS) ×2
COVER SURGICAL LIGHT HANDLE (MISCELLANEOUS) ×3 IMPLANT
COVER WAND RF STERILE (DRAPES) IMPLANT
CUFF TOURN SGL QUICK 34 (TOURNIQUET CUFF) ×2
CUFF TRNQT CYL 34X4X40X1 (TOURNIQUET CUFF) ×1 IMPLANT
DECANTER SPIKE VIAL GLASS SM (MISCELLANEOUS) ×3 IMPLANT
DRAPE U-SHAPE 47X51 STRL (DRAPES) ×3 IMPLANT
DRSG ADAPTIC 3X8 NADH LF (GAUZE/BANDAGES/DRESSINGS) ×3 IMPLANT
DURAPREP 26ML APPLICATOR (WOUND CARE) ×3 IMPLANT
ELECT REM PT RETURN 15FT ADLT (MISCELLANEOUS) ×3 IMPLANT
EVACUATOR 1/8 PVC DRAIN (DRAIN) ×3 IMPLANT
GAUZE SPONGE 4X4 12PLY STRL (GAUZE/BANDAGES/DRESSINGS) ×3 IMPLANT
GLOVE BIO SURGEON STRL SZ8 (GLOVE) ×3 IMPLANT
GLOVE BIOGEL PI IND STRL 6.5 (GLOVE) ×1 IMPLANT
GLOVE BIOGEL PI IND STRL 7.5 (GLOVE) ×3 IMPLANT
GLOVE BIOGEL PI IND STRL 8 (GLOVE) ×1 IMPLANT
GLOVE BIOGEL PI IND STRL 9 (GLOVE) ×1 IMPLANT
GLOVE BIOGEL PI INDICATOR 6.5 (GLOVE) ×2
GLOVE BIOGEL PI INDICATOR 7.5 (GLOVE) ×6
GLOVE BIOGEL PI INDICATOR 8 (GLOVE) ×2
GLOVE BIOGEL PI INDICATOR 9 (GLOVE) ×2
GLOVE ECLIPSE 9.0 STRL (GLOVE) ×3 IMPLANT
GLOVE SURG SS PI 6.5 STRL IVOR (GLOVE) ×3 IMPLANT
GLOVE SURG SS PI 7.5 STRL IVOR (GLOVE) ×3 IMPLANT
GOWN SPEC L3 XXLG W/TWL (GOWN DISPOSABLE) ×3 IMPLANT
GOWN STRL REIN 3XL LVL4 (GOWN DISPOSABLE) ×3 IMPLANT
GOWN STRL REUS W/TWL LRG LVL3 (GOWN DISPOSABLE) ×6 IMPLANT
HANDPIECE INTERPULSE COAX TIP (DISPOSABLE) ×2
HOLDER FOLEY CATH W/STRAP (MISCELLANEOUS) ×3 IMPLANT
IMMOBILIZER KNEE 20 (SOFTGOODS) ×3
IMMOBILIZER KNEE 20 THIGH 36 (SOFTGOODS) ×1 IMPLANT
MANIFOLD NEPTUNE II (INSTRUMENTS) ×3 IMPLANT
PACK TOTAL KNEE CUSTOM (KITS) ×3 IMPLANT
PAD ABD 8X10 STRL (GAUZE/BANDAGES/DRESSINGS) ×3 IMPLANT
PADDING CAST COTTON 6X4 STRL (CAST SUPPLIES) ×6 IMPLANT
PATELLA MEDIAL ATTUN 35MM KNEE (Knees) ×3 IMPLANT
PIN STEINMAN FIXATION KNEE (PIN) ×3 IMPLANT
PROTECTOR NERVE ULNAR (MISCELLANEOUS) ×3 IMPLANT
SET HNDPC FAN SPRY TIP SCT (DISPOSABLE) ×1 IMPLANT
STRIP CLOSURE SKIN 1/2X4 (GAUZE/BANDAGES/DRESSINGS) ×4 IMPLANT
SUT MNCRL AB 4-0 PS2 18 (SUTURE) ×3 IMPLANT
SUT STRATAFIX 0 PDS 27 VIOLET (SUTURE) ×3
SUT VIC AB 2-0 CT1 27 (SUTURE) ×6
SUT VIC AB 2-0 CT1 TAPERPNT 27 (SUTURE) ×3 IMPLANT
SUTURE STRATFX 0 PDS 27 VIOLET (SUTURE) ×1 IMPLANT
TRAY FOLEY MTR SLVR 14FR STAT (SET/KITS/TRAYS/PACK) ×3 IMPLANT
WRAP KNEE MAXI GEL POST OP (GAUZE/BANDAGES/DRESSINGS) ×3 IMPLANT
YANKAUER SUCT BULB TIP 10FT TU (MISCELLANEOUS) ×3 IMPLANT

## 2018-05-31 NOTE — Anesthesia Procedure Notes (Signed)
Spinal  Patient location during procedure: OR Start time: 05/31/2018 10:19 AM End time: 05/31/2018 10:22 AM Staffing Anesthesiologist: Nolon Nations, MD Performed: anesthesiologist  Preanesthetic Checklist Completed: patient identified, site marked, surgical consent, pre-op evaluation, timeout performed, IV checked, risks and benefits discussed and monitors and equipment checked Spinal Block Patient position: sitting Prep: site prepped and draped and DuraPrep Patient monitoring: heart rate, continuous pulse ox and blood pressure Approach: right paramedian Location: L2-3 Injection technique: single-shot Needle Needle type: Sprotte  Needle gauge: 24 G Needle length: 9 cm Additional Notes Expiration date of kit checked and confirmed. Patient tolerated procedure well, without complications.

## 2018-05-31 NOTE — Op Note (Signed)
OPERATIVE REPORT-TOTAL KNEE ARTHROPLASTY   Pre-operative diagnosis- Osteoarthritis  Right knee(s)  Post-operative diagnosis- Osteoarthritis Right knee(s)  Procedure-  Right  Total Knee Arthroplasty  Surgeon- Dione Plover. Kafi Dotter, MD  Assistant- Ardeen Jourdain, PA-C   Anesthesia-  Adductor canal block and spinal  EBL-25 mL   Drains Hemovac  Tourniquet time-  Total Tourniquet Time Documented: Thigh (Right) - 31 minutes Total: Thigh (Right) - 31 minutes     Complications- None  Condition-PACU - hemodynamically stable.   Brief Clinical Note  Kristen Escobar is a 73 y.o. year old female with end stage OA of her right knee with progressively worsening pain and dysfunction. She has constant pain, with activity and at rest and significant functional deficits with difficulties even with ADLs. She has had extensive non-op management including analgesics, injections of cortisone and viscosupplements, and home exercise program, but remains in significant pain with significant dysfunction.Radiographs show bone on bone arthritis lateral and patellofemoral. She presents now for right Total Knee Arthroplasty.    Procedure in detail---   The patient is brought into the operating room and positioned supine on the operating table. After successful administration of  Adductor canal block and spinal,   a tourniquet is placed high on the  Right thigh(s) and the lower extremity is prepped and draped in the usual sterile fashion. Time out is performed by the operating team and then the  Right lower extremity is wrapped in Esmarch, knee flexed and the tourniquet inflated to 300 mmHg.       A midline incision is made with a ten blade through the subcutaneous tissue to the level of the extensor mechanism. A fresh blade is used to make a medial parapatellar arthrotomy. Soft tissue over the proximal medial tibia is subperiosteally elevated to the joint line with a knife and into the semimembranosus bursa with a  Cobb elevator. Soft tissue over the proximal lateral tibia is elevated with attention being paid to avoiding the patellar tendon on the tibial tubercle. The patella is everted, knee flexed 90 degrees and the ACL and PCL are removed. Findings are bone on bone lateral and patellofemoral with large global osteophytes.        The drill is used to create a starting hole in the distal femur and the canal is thoroughly irrigated with sterile saline to remove the fatty contents. The 5 degree Right  valgus alignment guide is placed into the femoral canal and the distal femoral cutting block is pinned to remove 9 mm off the distal femur. Resection is made with an oscillating saw.      The tibia is subluxed forward and the menisci are removed. The extramedullary alignment guide is placed referencing proximally at the medial aspect of the tibial tubercle and distally along the second metatarsal axis and tibial crest. The block is pinned to remove 52mm off the more deficient lateral  side. Resection is made with an oscillating saw. Size 4is the most appropriate size for the tibia and the proximal tibia is prepared with the modular drill and keel punch for that size.      The femoral sizing guide is placed and size 5 is most appropriate. Rotation is marked off the epicondylar axis and confirmed by creating a rectangular flexion gap at 90 degrees. The size 5 cutting block is pinned in this rotation and the anterior, posterior and chamfer cuts are made with the oscillating saw. The intercondylar block is then placed and that cut is made.  Trial size 4 tibial component, trial size 5 posterior stabilized femur and a 8  mm posterior stabilized rotating platform insert trial is placed. Full extension is achieved with excellent varus/valgus and anterior/posterior balance throughout full range of motion. The patella is everted and thickness measured to be 22  mm. Free hand resection is taken to 12 mm, a 35 template is placed, lug  holes are drilled, trial patella is placed, and it tracks normally. Osteophytes are removed off the posterior femur with the trial in place. All trials are removed and the cut bone surfaces prepared with pulsatile lavage. Cement is mixed and once ready for implantation, the size 4 tibial implant, size  5 posterior stabilized femoral component, and the size 35 patella are cemented in place and the patella is held with the clamp. The trial insert is placed and the knee held in full extension. The Exparel (20 ml mixed with 60 ml saline) is injected into the extensor mechanism, posterior capsule, medial and lateral gutters and subcutaneous tissues.  All extruded cement is removed and once the cement is hard the permanent 8 mm posterior stabilized rotating platform insert is placed into the tibial tray.      The wound is copiously irrigated with saline solution and the extensor mechanism closed over a hemovac drain with #1 V-loc suture. The tourniquet is released for a total tourniquet time of 31  minutes. Flexion against gravity is 140 degrees and the patella tracks normally. Subcutaneous tissue is closed with 2.0 vicryl and subcuticular with running 4.0 Monocryl. The incision is cleaned and dried and steri-strips and a bulky sterile dressing are applied. The limb is placed into a knee immobilizer and the patient is awakened and transported to recovery in stable condition.      Please note that a surgical assistant was a medical necessity for this procedure in order to perform it in a safe and expeditious manner. Surgical assistant was necessary to retract the ligaments and vital neurovascular structures to prevent injury to them and also necessary for proper positioning of the limb to allow for anatomic placement of the prosthesis.   Dione Plover Emaya Preston, MD    05/31/2018, 11:27 AM

## 2018-05-31 NOTE — Discharge Instructions (Addendum)
° °Dr. Frank Aluisio °Total Joint Specialist °[facility] °[address] °[place], [place] [address] °[phone] ° °TOTAL KNEE REPLACEMENT POSTOPERATIVE DIRECTIONS ° °Knee Rehabilitation, Guidelines Following Surgery  °Results after knee surgery are often greatly improved when you follow the exercise, range of motion and muscle strengthening exercises prescribed by your doctor. Safety measures are also important to protect the knee from further injury. Any time any of these exercises cause you to have increased pain or swelling in your knee joint, decrease the amount until you are comfortable again and slowly increase them. If you have problems or questions, call your caregiver or physical therapist for advice.  ° °HOME CARE INSTRUCTIONS  °Remove items at home which could result in a fall. This includes throw rugs or furniture in walking pathways.  °· ICE to the affected knee every three hours for 30 minutes at a time and then as needed for pain and swelling.  Continue to use ice on the knee for pain and swelling from surgery. You may notice swelling that will progress down to the foot and ankle.  This is normal after surgery.  Elevate the leg when you are not up walking on it.   °· Continue to use the breathing machine which will help keep your temperature down.  It is common for your temperature to cycle up and down following surgery, especially at night when you are not up moving around and exerting yourself.  The breathing machine keeps your lungs expanded and your temperature down. °· Do not place pillow under knee, focus on keeping the knee straight while resting ° °DIET °You may resume your previous home diet once your are discharged from the hospital. ° °DRESSING / WOUND CARE / SHOWERING °You may shower 3 days after surgery, but keep the wounds dry during showering.  You may use an occlusive plastic wrap (Press'n Seal for example), NO SOAKING/SUBMERGING IN THE BATHTUB.  If the bandage gets  wet, change with a clean dry gauze.  If the incision gets wet, pat the wound dry with a clean towel. °You may start showering once you are discharged home but do not submerge the incision under water. Just pat the incision dry and apply a dry gauze dressing on daily. °Change the surgical dressing daily and reapply a dry dressing each time. ° °ACTIVITY °Walk with your walker as instructed. °Use walker as long as suggested by your caregivers. °Avoid periods of inactivity such as sitting longer than an hour when not asleep. This helps prevent blood clots.  °You may resume a sexual relationship in one month or when given the OK by your doctor.  °You may return to work once you are cleared by your doctor.  °Do not drive a car for 6 weeks or until released by you surgeon.  °Do not drive while taking narcotics. ° °WEIGHT BEARING °Weight bearing as tolerated with assist device (walker, cane, etc) as directed, use it as long as suggested by your surgeon or therapist, typically at least 4-6 weeks. ° °POSTOPERATIVE CONSTIPATION PROTOCOL °Constipation - defined medically as fewer than three stools per week and severe constipation as less than one stool per week. ° °One of the most common issues patients have following surgery is constipation.  Even if you have a regular bowel pattern at home, your normal regimen is likely to be disrupted due to multiple reasons following surgery.  Combination of anesthesia, postoperative narcotics, change in appetite and fluid intake all can affect your bowels.    In order to avoid complications following surgery, here are some recommendations in order to help you during your recovery period. ° °Colace (docusate) - Pick up an over-the-counter form of Colace or another stool softener and take twice a day as long as you are requiring postoperative pain medications.  Take with a full glass of water daily.  If you experience loose stools or diarrhea, hold the colace until you stool forms back up.  If  your symptoms do not get better within 1 week or if they get worse, check with your doctor. ° °Dulcolax (bisacodyl) - Pick up over-the-counter and take as directed by the product packaging as needed to assist with the movement of your bowels.  Take with a full glass of water.  Use this product as needed if not relieved by Colace only.  ° °MiraLax (polyethylene glycol) - Pick up over-the-counter to have on hand.  MiraLax is a solution that will increase the amount of water in your bowels to assist with bowel movements.  Take as directed and can mix with a glass of water, juice, soda, coffee, or tea.  Take if you go more than two days without a movement. °Do not use MiraLax more than once per day. Call your doctor if you are still constipated or irregular after using this medication for 7 days in a row. ° °If you continue to have problems with postoperative constipation, please contact the office for further assistance and recommendations.  If you experience "the worst abdominal pain ever" or develop nausea or vomiting, please contact the office immediatly for further recommendations for treatment. ° °ITCHING ° If you experience itching with your medications, try taking only a single pain pill, or even half a pain pill at a time.  You can also use Benadryl over the counter for itching or also to help with sleep.  ° °TED HOSE STOCKINGS °Wear the elastic stockings on both legs for three weeks following surgery during the day but you may remove then at night for sleeping. ° °MEDICATIONS °See your medication summary on the “After Visit Summary” that the nursing staff will review with you prior to discharge.  You may have some home medications which will be placed on hold until you complete the course of blood thinner medication.  It is important for you to complete the blood thinner medication as prescribed by your surgeon.  Continue your approved medications as instructed at time of discharge. ° °PRECAUTIONS °If you  experience chest pain or shortness of breath - call 911 immediately for transfer to the hospital emergency department.  °If you develop a fever greater that 101 F, purulent drainage from wound, increased redness or drainage from wound, foul odor from the wound/dressing, or calf pain - CONTACT YOUR SURGEON.   °                                                °FOLLOW-UP APPOINTMENTS °Make sure you keep all of your appointments after your operation with your surgeon and caregivers. You should call the office at the above phone number and make an appointment for approximately two weeks after the date of your surgery or on the date instructed by your surgeon outlined in the "After Visit Summary". ° ° °RANGE OF MOTION AND STRENGTHENING EXERCISES  °Rehabilitation of the knee is important following a knee injury or   an operation. After just a few days of immobilization, the muscles of the thigh which control the knee become weakened and shrink (atrophy). Knee exercises are designed to build up the tone and strength of the thigh muscles and to improve knee motion. Often times heat used for twenty to thirty minutes before working out will loosen up your tissues and help with improving the range of motion but do not use heat for the first two weeks following surgery. These exercises can be done on a training (exercise) mat, on the floor, on a table or on a bed. Use what ever works the best and is most comfortable for you Knee exercises include:  °Leg Lifts - While your knee is still immobilized in a splint or cast, you can do straight leg raises. Lift the leg to 60 degrees, hold for 3 sec, and slowly lower the leg. Repeat 10-20 times 2-3 times daily. Perform this exercise against resistance later as your knee gets better.  °Quad and Hamstring Sets - Tighten up the muscle on the front of the thigh (Quad) and hold for 5-10 sec. Repeat this 10-20 times hourly. Hamstring sets are done by pushing the foot backward against an object  and holding for 5-10 sec. Repeat as with quad sets.  °· Leg Slides: Lying on your back, slowly slide your foot toward your buttocks, bending your knee up off the floor (only go as far as is comfortable). Then slowly slide your foot back down until your leg is flat on the floor again. °· Angel Wings: Lying on your back spread your legs to the side as far apart as you can without causing discomfort.  °A rehabilitation program following serious knee injuries can speed recovery and prevent re-injury in the future due to weakened muscles. Contact your doctor or a physical therapist for more information on knee rehabilitation.  ° °IF YOU ARE TRANSFERRED TO A SKILLED REHAB FACILITY °If the patient is transferred to a skilled rehab facility following release from the hospital, a list of the current medications will be sent to the facility for the patient to continue.  When discharged from the skilled rehab facility, please have the facility set up the patient's Home Health Physical Therapy prior to being released. Also, the skilled facility will be responsible for providing the patient with their medications at time of release from the facility to include their pain medication, the muscle relaxants, and their blood thinner medication. If the patient is still at the rehab facility at time of the two week follow up appointment, the skilled rehab facility will also need to assist the patient in arranging follow up appointment in our office and any transportation needs. ° °MAKE SURE YOU:  °Understand these instructions.  °Get help right away if you are not doing well or get worse.  ° ° °Pick up stool softner and laxative for home use following surgery while on pain medications. °Do not submerge incision under water. °Please use good hand washing techniques while changing dressing each day. °May shower starting three days after surgery. °Please use a clean towel to pat the incision dry following showers. °Continue to use ice for  pain and swelling after surgery. °Do not use any lotions or creams on the incision until instructed by your surgeon. ° °Information on my medicine - XARELTO® (Rivaroxaban) ° ° ° °Why was Xarelto® prescribed for you? °Xarelto® was prescribed for you to reduce the risk of blood clots forming after orthopedic surgery. The medical term for   these abnormal blood clots is venous thromboembolism (VTE). ° °What do you need to know about xarelto® ? °Take your Xarelto® ONCE DAILY at the same time every day. °You may take it either with or without food. ° °If you have difficulty swallowing the tablet whole, you may crush it and mix in applesauce just prior to taking your dose. ° °Take Xarelto® exactly as prescribed by your doctor and DO NOT stop taking Xarelto® without talking to the doctor who prescribed the medication.  Stopping without other VTE prevention medication to take the place of Xarelto® may increase your risk of developing a clot. ° °After discharge, you should have regular check-up appointments with your healthcare provider that is prescribing your Xarelto®.   ° °What do you do if you miss a dose? °If you miss a dose, take it as soon as you remember on the same day then continue your regularly scheduled once daily regimen the next day. Do not take two doses of Xarelto® on the same day.  ° °Important Safety Information °A possible side effect of Xarelto® is bleeding. You should call your healthcare provider right away if you experience any of the following: °? Bleeding from an injury or your nose that does not stop. °? Unusual colored urine (red or dark brown) or unusual colored stools (red or black). °? Unusual bruising for unknown reasons. °? A serious fall or if you hit your head (even if there is no bleeding). ° °Some medicines may interact with Xarelto® and might increase your risk of bleeding while on Xarelto®. To help avoid this, consult your healthcare provider or pharmacist prior to using any new  prescription or non-prescription medications, including herbals, vitamins, non-steroidal anti-inflammatory drugs (NSAIDs) and supplements. ° °This website has more information on Xarelto®: www.xarelto.com. ° ° °

## 2018-05-31 NOTE — Progress Notes (Signed)
Initial Nutrition Assessment  DOCUMENTATION CODES:   Obesity unspecified  INTERVENTION:   Resume night feed TF regimen: Osmolite 1.5 @ 120 ml/hr over 10 hours (1900-0500) Free water infuses at 60 ml/hr over 10 hours (600 ml) This regimen provides 1800 kcal, 75g protein, 1514 ml H2O.  NUTRITION DIAGNOSIS:   Inadequate oral intake related to dysphagia, cancer and cancer related treatments as evidenced by per patient/family report, NPO status.  GOAL:   Patient will meet greater than or equal to 90% of their needs  MONITOR:   Labs, Weight trends, I & O's, TF tolerance  REASON FOR ASSESSMENT:   Consult Enteral/tube feeding initiation and management  ASSESSMENT:   73 y.o. patient with history of pain and functional disability in the right knee due to arthritis. PMHx of breast cancer and B-cell lymphoma.  Patient admitted for right total knee arthroplasty. 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 Pt has been NPO since PEG was placed 02/19/2017.   Per pt's husband at beside, pt has been tolerating night feed regimen over 10 hours for about 14 months now. Pt uses Osmolite 1.5 @ 120 ml/hr x 10 hours (1900-0500). The pump they use at home allows for free water infusion at 60 ml/hr via pump. In total, pt's husband states they infuse ~5.5 cans overnight (providing 1952 kcal and 81g protein).  Will order Osmolite 1.5 to run at same rate and interval as home regimen for consistency.   Per weight records, weights have been trending down but weight loss is not significant for time frame.  Labs reviewed. Medications reviewed.  NUTRITION - FOCUSED PHYSICAL EXAM:  Nutrition focused physical exam shows no sign of depletion of muscle mass or body fat.  Diet Order:   Diet Order    None      EDUCATION NEEDS:   No education needs have been identified at this time  Skin:  Skin  Assessment: Reviewed RN Assessment  Last BM:  2/9  Height:   Ht Readings from Last 1 Encounters:  05/31/18 5\' 2"  (1.575 m)    Weight:   Wt Readings from Last 1 Encounters:  05/31/18 86.1 kg    Ideal Body Weight:  50 kg  BMI:  Body mass index is 34.71 kg/m.  Estimated Nutritional Needs:   Kcal:  1700-1900  Protein:  80-90g  Fluid:  1.7L/day  Clayton Bibles, MS, RD, LDN [place] [facility] Pager: [phone] After Hours Pager: [phone]

## 2018-05-31 NOTE — Anesthesia Procedure Notes (Signed)
Anesthesia Regional Block: Adductor canal block   Pre-Anesthetic Checklist: ,, timeout performed, Correct Patient, Correct Site, Correct Laterality, Correct Procedure, Correct Position, site marked, Risks and benefits discussed,  Surgical consent,  Pre-op evaluation,  At surgeon's request and post-op pain management  Laterality: Right  Prep: chloraprep       Needles:  Injection technique: Single-shot  Needle Type: Stimiplex     Needle Length: 9cm  Needle Gauge: 21     Additional Needles:   Procedures:,,,, ultrasound used (permanent image in chart),,,,  Narrative:  Start time: 05/31/2018 9:50 AM End time: 05/31/2018 9:52 AM Injection made incrementally with aspirations every 5 mL.  Performed by: Personally  Anesthesiologist: Nolon Nations, MD  Additional Notes: BP cuff, EKG monitors applied. Sedation begun. Artery and nerve location verified with U/S and anesthetic injected incrementally, slowly, and after negative aspirations under direct u/s guidance. Good fascial /perineural spread. Tolerated well.

## 2018-05-31 NOTE — Progress Notes (Signed)
Tube feedings initiated per dietary note and order. Pt tolerating well, tube flushed, no residual. RN will monitor.

## 2018-05-31 NOTE — Progress Notes (Signed)
Assisted Dr. Germeroth with right, ultrasound guided, adductor canal block. Side rails up, monitors on throughout procedure. See vital signs in flow sheet. Tolerated Procedure well. 

## 2018-05-31 NOTE — Interval H&P Note (Signed)
History and Physical Interval Note:  05/31/2018 8:24 AM  Kristen Escobar  has presented today for surgery, with the diagnosis of right knee osteoarthritis  The various methods of treatment have been discussed with the patient and family. After consideration of risks, benefits and other options for treatment, the patient has consented to  Procedure(s) with comments: TOTAL KNEE ARTHROPLASTY (Right) - 8min as a surgical intervention .  [name] history has been reviewed, patient examined, no change in status, stable for surgery.  I have reviewed the patient's chart and labs.  Questions were answered to the patient's satisfaction.     Pilar Plate Sammy Douthitt

## 2018-05-31 NOTE — Evaluation (Signed)
Physical Therapy Evaluation Patient Details Name: Kristen Escobar MRN: [id number] DOB: 29-Sep-1945 Today's Date: 05/31/2018   History of Present Illness  73 yo female s/p R TKA 05/31/18. Hx of L THA 2013, breast ca, PEG  Clinical Impression  On eval POD 0, pt required Min assist for mobility. She walked ~50 feet with a RW. Mild-Mod pain with activity. Will follow and progress activity as tolerated. D/c plan is for home with OP PT f/u.     Follow Up Recommendations Follow surgeon's recommendation for DC plan and follow-up therapies    Equipment Recommendations  None recommended by PT    Recommendations for Other Services       Precautions / Restrictions Precautions Precautions: Fall Required Braces or Orthoses: Knee Immobilizer - Right Knee Immobilizer - Right: Discontinue once straight leg raise with < 10 degree lag Restrictions Weight Bearing Restrictions: No Other Position/Activity Restrictions: WBAT      Mobility  Bed Mobility Overal bed mobility: Needs Assistance Bed Mobility: Supine to Sit     Supine to sit: Min guard     General bed mobility comments: close guard for safety.   Transfers Overall transfer level: Needs assistance Equipment used: Rolling walker (2 wheeled) Transfers: Sit to/from Stand Sit to Stand: Min assist         General transfer comment: Assist to rise, steady. VCs safety, technique, hand/LE placement.   Ambulation/Gait Ambulation/Gait assistance: Min guard Gait Distance (Feet): 50 Feet Assistive device: Rolling walker (2 wheeled) Gait Pattern/deviations: Step-to pattern;Step-through pattern;Decreased stride length     General Gait Details: Close guard for safety. VCs safety, sequence.   Stairs            Wheelchair Mobility    Modified Rankin (Stroke Patients Only)       Balance Overall balance assessment: Mild deficits observed, not formally tested                                            Pertinent Vitals/Pain Pain Assessment: 0-10 Pain Score: 4  Pain Location: R knee Pain Descriptors / Indicators: Sore;Aching Pain Intervention(s): Monitored during session;Repositioned    Home Living Family/patient expects to be discharged to:: Private residence Living Arrangements: Spouse/significant other Available Help at Discharge: Family Type of Home: House Home Access: Stairs to enter Entrance Stairs-Rails: Psychiatric nurse of Steps: 4 Home Layout: Able to live on main level with bedroom/bathroom Home Equipment: Walker - 2 wheels;Bedside commode;Cane - single point      Prior Function Level of Independence: Independent               Hand Dominance        Extremity/Trunk Assessment   Upper Extremity Assessment Upper Extremity Assessment: Overall WFL for tasks assessed    Lower Extremity Assessment Lower Extremity Assessment: Generalized weakness    Cervical / Trunk Assessment Cervical / Trunk Assessment: Normal  Communication   Communication: No difficulties  Cognition Arousal/Alertness: Awake/alert Behavior During Therapy: WFL for tasks assessed/performed Overall Cognitive Status: Within Functional Limits for tasks assessed                                        General Comments      Exercises     Assessment/Plan    PT Assessment Patient needs continued  PT services  PT Problem List Decreased strength;Decreased balance;Decreased range of motion;Decreased mobility;Decreased activity tolerance;Pain;Decreased knowledge of use of DME       PT Treatment Interventions DME instruction;Gait training;Therapeutic activities;Balance training;Functional mobility training;Patient/family education;Therapeutic exercise;Stair training    PT Goals (Current goals can be found in the Care Plan section)  Acute Rehab PT Goals Patient Stated Goal: regain PLOF/independence PT Goal Formulation: With patient/family Time For  Goal Achievement: 06/14/18 Potential to Achieve Goals: Good    Frequency 7X/week   Barriers to discharge        Co-evaluation               AM-PAC PT "6 Clicks" Mobility  Outcome Measure Help needed turning from your back to your side while in a flat bed without using bedrails?: A Little Help needed moving from lying on your back to sitting on the side of a flat bed without using bedrails?: A Little Help needed moving to and from a bed to a chair (including a wheelchair)?: A Little Help needed standing up from a chair using your arms (e.g., wheelchair or bedside chair)?: A Little Help needed to walk in hospital room?: A Little Help needed climbing 3-5 steps with a railing? : A Little 6 Click Score: 18    End of Session Equipment Utilized During Treatment: Gait belt;Right knee immobilizer Activity Tolerance: Patient tolerated treatment well Patient left: in chair;with call bell/phone within reach;with family/visitor present   PT Visit Diagnosis: Unsteadiness on feet (R26.81);Other abnormalities of gait and mobility (R26.89);Pain Pain - Right/Left: Right Pain - part of body: Knee    Time: 4709-2957 PT Time Calculation (min) (ACUTE ONLY): 18 min   Charges:   PT Evaluation $PT Eval Low Complexity: [place] [facility], PT Acute Rehabilitation Services Pager: [phone] Office: [phone] '

## 2018-05-31 NOTE — Plan of Care (Signed)
Pt alert and oriented, resting with husband at the bedside. In CPM, doing well post op. RN will monitor.

## 2018-05-31 NOTE — Transfer of Care (Signed)
Immediate Anesthesia Transfer of Care Note  Patient: Kristen Escobar  Procedure(s) Performed: Procedure(s) with comments: TOTAL KNEE ARTHROPLASTY (Right) - Adductor Block  Patient Location: PACU  Anesthesia Type:Spinal  Level of Consciousness:  sedated, patient cooperative and responds to stimulation  Airway & Oxygen Therapy:Patient Spontanous Breathing and Patient connected to face mask oxgen  Post-op Assessment:  Report given to PACU RN and Post -op Vital signs reviewed and stable  Post vital signs:  Reviewed and stable  Last Vitals:  Vitals:   05/31/18 1143 05/31/18 1145  BP: 102/83 (!) 124/48  Pulse: 63 66  Resp: 16 18  Temp:    SpO2: 862% 824%    Complications: No apparent anesthesia complications

## 2018-05-31 NOTE — Anesthesia Procedure Notes (Signed)
Procedure Name: MAC Date/Time: 05/31/2018 10:17 AM Performed by: West Pugh, CRNA Pre-anesthesia Checklist: Patient identified, Emergency Drugs available, Suction available, Patient being monitored and Timeout performed Patient Re-evaluated:Patient Re-evaluated prior to induction Oxygen Delivery Method: Nasal cannula Preoxygenation: Pre-oxygenation with 100% oxygen Induction Type: IV induction Placement Confirmation: positive ETCO2 Dental Injury: Teeth and Oropharynx as per pre-operative assessment

## 2018-05-31 NOTE — Care Plan (Signed)
Ortho Bundle Case Management Note  Patient Details  Name: Tanicka Bisaillon MRN: [id number] Date of Birth: 1945-12-18  R TKA on 05-31-2018 DCP:  Home with spouse.  2 story home with 3 ste.  MBR on main level. DME:  No needs.  Has a RW and 3-in-1. PT:  [facility].  PT eval scheduled on 06-04-2018.                  DME Arranged:  N/A DME Agency:  NA  HH Arranged:  NA HH Agency:  NA  Additional Comments: Please contact me with any questions of if this plan should need to change.  Marianne Sofia, RN,CCM [facility]  [phone] 05/31/2018, 3:15 PM

## 2018-05-31 NOTE — Anesthesia Postprocedure Evaluation (Signed)
Anesthesia Post Note  Patient: Kristen Escobar  Procedure(s) Performed: TOTAL KNEE ARTHROPLASTY (Right Knee)     Patient location during evaluation: PACU Anesthesia Type: Spinal Level of consciousness: awake and alert Pain management: pain level controlled Vital Signs Assessment: post-procedure vital signs reviewed and stable Respiratory status: spontaneous breathing and respiratory function stable Cardiovascular status: blood pressure returned to baseline and stable Postop Assessment: spinal receding Anesthetic complications: no    Last Vitals:  Vitals:   05/31/18 1551 05/31/18 1826  BP: (!) 133/50 (!) 131/54  Pulse: 64 67  Resp: 14 14  Temp: 36.6 C 36.5 C  SpO2: 92% 96%    Last Pain:  Vitals:   05/31/18 1826  TempSrc: Oral  PainSc:                  Nolon Nations

## 2018-06-01 ENCOUNTER — Encounter (HOSPITAL_COMMUNITY): Payer: Self-pay | Admitting: Orthopedic Surgery

## 2018-06-01 LAB — BASIC METABOLIC PANEL
Anion gap: 10 (ref 5–15)
BUN: 24 mg/dL — AB (ref 8–23)
CO2: 21 mmol/L — ABNORMAL LOW (ref 22–32)
Calcium: 8.3 mg/dL — ABNORMAL LOW (ref 8.9–10.3)
Chloride: 106 mmol/L (ref 98–111)
Creatinine, Ser: 0.89 mg/dL (ref 0.44–1.00)
GFR calc Af Amer: >60 mL/min (ref >60)
GFR calc non Af Amer: >60 mL/min (ref >60)
Glucose, Bld: 131 mg/dL — ABNORMAL HIGH (ref 70–99)
Potassium: 4.3 mmol/L (ref 3.5–5.1)
Sodium: 137 mmol/L (ref 135–145)

## 2018-06-01 LAB — CBC
HCT: 34.7 % — ABNORMAL LOW (ref 36.0–46.0)
Hemoglobin: 10.5 g/dL — ABNORMAL LOW (ref 12.0–15.0)
MCH: 27.3 pg (ref 26.0–34.0)
MCHC: 30.3 g/dL (ref 30.0–36.0)
MCV: 90.4 fL (ref 80.0–100.0)
Platelets: 176 10*3/uL (ref 150–400)
RBC: 3.84 MIL/uL — ABNORMAL LOW (ref 3.87–5.11)
RDW: 15.3 % (ref 11.5–15.5)
WBC: 14 10*3/uL — ABNORMAL HIGH (ref 4.0–10.5)
nRBC: 0 % (ref 0.0–0.2)

## 2018-06-01 LAB — GLUCOSE, CAPILLARY
GLUCOSE-CAPILLARY: 191 mg/dL — AB (ref 70–99)
Glucose-Capillary: 270 mg/dL — ABNORMAL HIGH (ref 70–99)
Glucose-Capillary: 120 mg/dL — ABNORMAL HIGH (ref 70–99)
Glucose-Capillary: 127 mg/dL — ABNORMAL HIGH (ref 70–99)
Glucose-Capillary: 124 mg/dL — ABNORMAL HIGH (ref 70–99)
Glucose-Capillary: 124 mg/dL — ABNORMAL HIGH (ref 70–99)
Glucose-Capillary: 118 mg/dL — ABNORMAL HIGH (ref 70–99)

## 2018-06-01 MED ORDER — TRAMADOL HCL 50 MG PO TABS: 50.0000 mg | ORAL_TABLET | Freq: Four times a day (QID) | ORAL | 0 refills | Status: DC | PRN

## 2018-06-01 MED ORDER — RIVAROXABAN 10 MG PO TABS: 10.0000 mg | ORAL_TABLET | Freq: Every day | ORAL | 0 refills | Status: DC

## 2018-06-01 MED ORDER — OXYCODONE HCL 5 MG/5ML PO SOLN: 5.0000 mg | Freq: Four times a day (QID) | ORAL | 0 refills | Status: AC | PRN

## 2018-06-01 MED ORDER — METHOCARBAMOL 500 MG PO TABS: 500.0000 mg | ORAL_TABLET | Freq: Four times a day (QID) | ORAL | 0 refills | Status: DC | PRN

## 2018-06-01 NOTE — Progress Notes (Signed)
Subjective: 1 Day Post-Op Procedure(s) (LRB): TOTAL KNEE ARTHROPLASTY (Right) Patient reports pain as mild.   Patient seen in rounds by Dr. Wynelle Link. Patient is well, and has had no acute complaints or problems. No issues overnight. Denies chest pain, SOB, or calf pain. Foley catheter removed this AM. We will continue therapy today.   Objective: Vital signs in last 24 hours: Temp:  [97.4 F (36.3 C)-98.3 F (36.8 C)] 97.6 F (36.4 C) (02/11 0612) Pulse Rate:  [56-70] 67 (02/11 0612) Resp:  [14-19] 16 (02/11 0221) BP: (102-179)/(46-84) 128/56 (02/11 0612) SpO2:  [92 %-100 %] 94 % (02/11 0612) Weight:  [86.1 kg] 86.1 kg (02/10 0844)  Intake/Output from previous day:  Intake/Output Summary (Last 24 hours) at 06/01/2018 0731 Last data filed at 06/01/2018 0600 Gross per 24 hour  Intake 4343.82 ml  Output 1420 ml  Net 2923.82 ml    Labs: Recent Labs    06/01/18 0423  HGB 10.5*   Recent Labs    06/01/18 0423  WBC 14.0*  RBC 3.84*  HCT 34.7*  PLT 176   Recent Labs    06/01/18 0423  NA 137  K 4.3  CL 106  CO2 21*  BUN 24*  CREATININE 0.89  GLUCOSE 131*  CALCIUM 8.3*   Exam: General - Patient is Alert and Oriented Extremity - Neurologically intact Neurovascular intact Sensation intact distally Dorsiflexion/Plantar flexion intact Dressing - dressing C/D/I Motor Function - intact, moving foot and toes well on exam.   Past Medical History:  Diagnosis Date  . Acute respiratory failure with hypoxia ([place]) 02/16/2017  . Arthritis   . Cancer ([place]) 03/2009   breast- rt  . 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 GERD (gastroesophageal reflux disease)   . History of radiation therapy 07/12/10,completed   right breast 60 Gy x30 fx  . Hypertension   . Hypothyroidism   . Obesity   . Peripheral vascular disease ([place]) 1995   PT DEVELOPED CIRCULATION PROBLEMS IN BOTH HANDS AND GANGRENE OF BOTH INDEX FINGERS--REQUIRING AMPUTATION OF THE INDEX FINGERS AND  VASCULAR SURGERY.  PT TOLD HER PROBLEMS RELATED TO SMOKING.   NO OTHER PROBLEMS SINCE.  Marland Kitchen Personal history of radiation therapy 2012   Right Breast Cancer  . Pneumonia   . Thyroid mass     Assessment/Plan: 1 Day Post-Op Procedure(s) (LRB): TOTAL KNEE ARTHROPLASTY (Right) Principal Problem:   OA (osteoarthritis) of knee  Estimated body mass index is 34.71 kg/m as calculated from the following:   Height as of this encounter: 5\' 2"  (1.575 m).   Weight as of this encounter: 86.1 kg. Advance diet Up with therapy D/C IV fluids  Anticipated LOS equal to or greater than 2 midnights due to - Age 72 and older with one or more of the following:  - Obesity  - Expected need for hospital services (PT, OT, Nursing) required for safe  discharge  - Anticipated need for postoperative skilled nursing care or inpatient rehab  - Active co-morbidities: History PE OR   - Unanticipated findings during/Post Surgery: None  - Patient is a high risk of re-admission due to: None    DVT Prophylaxis - Xarelto Weight bearing as tolerated. D/C O2 and pulse ox and try on room air. Hemovac pulled without difficulty, will continue therapy today.  Plan is to go Home after hospital stay. Possible discharge this afternoon pending progress with therapy. Scheduled for outpatient physical therapy at [facility]. Follow-up in the office in 2 weeks.  Theresa Duty, PA-C Orthopedic Surgery 06/01/2018, 7:31 AM

## 2018-06-01 NOTE — Progress Notes (Signed)
Physical Therapy Treatment Patient Details Name: Kristen Escobar MRN: [id number] DOB: [birth year] Today's Date: 06/01/2018    History of Present Illness 73 yo female s/p R TKA 05/31/18. Hx of L THA 2013, breast ca, PEG    PT Comments    Progressing well with mobility. Plan is for d/c home tomorrow with OP PT f/u on Friday.    Follow Up Recommendations  Follow surgeon's recommendation for DC plan and follow-up therapies     Equipment Recommendations  None recommended by PT    Recommendations for Other Services       Precautions / Restrictions Precautions Precautions: Fall Required Braces or Orthoses: Knee Immobilizer - Right Knee Immobilizer - Right: Discontinue once straight leg raise with < 10 degree lag(able to SLR 2/11) Restrictions Weight Bearing Restrictions: No Other Position/Activity Restrictions: WBAT    Mobility  Bed Mobility Overal bed mobility: Needs Assistance Bed Mobility: Sit to Supine;Supine to Sit     Supine to sit: Supervision Sit to supine: Supervision   General bed mobility comments: for safety  Transfers Overall transfer level: Needs assistance Equipment used: Rolling walker (2 wheeled) Transfers: Sit to/from Stand Sit to Stand: Supervision         General transfer comment: VCs safety, hand/LE placement.   Ambulation/Gait Ambulation/Gait assistance: Supervision Gait Distance (Feet): 200 Feet Assistive device: Rolling walker (2 wheeled) Gait Pattern/deviations: Step-to pattern;Step-through pattern;Decreased stride length     General Gait Details:  for safety.    Stairs             Wheelchair Mobility    Modified Rankin (Stroke Patients Only)       Balance Overall balance assessment: Mild deficits observed, not formally tested                                          Cognition Arousal/Alertness: Awake/alert Behavior During Therapy: WFL for tasks assessed/performed Overall Cognitive Status: Within  Functional Limits for tasks assessed                                        Exercises     General Comments        Pertinent Vitals/Pain Pain Assessment: 0-10 Pain Score: 4  Pain Location: R knee Pain Descriptors / Indicators: Sore;Aching Pain Intervention(s): Monitored during session;Repositioned    Home Living                      Prior Function            PT Goals (current goals can now be found in the care plan section) Progress towards PT goals: Progressing toward goals    Frequency    7X/week      PT Plan Current plan remains appropriate    Co-evaluation              AM-PAC PT "6 Clicks" Mobility   Outcome Measure  Help needed turning from your back to your side while in a flat bed without using bedrails?: A Little Help needed moving from lying on your back to sitting on the side of a flat bed without using bedrails?: A Little Help needed moving to and from a bed to a chair (including a wheelchair)?: A Little Help needed standing up from a chair using your  arms (e.g., wheelchair or bedside chair)?: A Little Help needed to walk in hospital room?: A Little Help needed climbing 3-5 steps with a railing? : A Little 6 Click Score: 18    End of Session Equipment Utilized During Treatment: Gait belt Activity Tolerance: Patient tolerated treatment well Patient left: in bed;with call bell/phone within reach   PT Visit Diagnosis: Unsteadiness on feet (R26.81);Other abnormalities of gait and mobility (R26.89);Pain Pain - Right/Left: Right Pain - part of body: Knee     Time: 1415-1431 PT Time Calculation (min) (ACUTE ONLY): 16 min  Charges:  $Gait Training: 8-22 mins                        Weston Anna, PT Acute Rehabilitation Services Pager: [phone] Office: [phone]

## 2018-06-01 NOTE — Care Management Note (Signed)
Case Management Note  Patient Details  Name: Kristen Escobar MRN: [id number] Date of Birth: 1945/08/23  Subjective/Objective:   Spoke with patient at bedside. Confirmed plan for OP PT, already arranged. Has RW and 3n1. [phone]                 Action/Plan:   Expected Discharge Date:  06/01/18               Expected Discharge Plan:  OP Rehab  In-House Referral:  NA  Discharge planning Services  CM Consult  Post Acute Care Choice:  NA Choice offered to:  Patient, Spouse  DME Arranged:  N/A DME Agency:  NA  HH Arranged:  NA HH Agency:  NA  Status of Service:  Completed, signed off  If discussed at [place] of Stay Meetings, dates discussed:    Additional Comments:  Guadalupe Maple, RN 06/01/2018, 11:37 AM

## 2018-06-01 NOTE — Progress Notes (Addendum)
Physical Therapy Treatment Patient Details Name: Kristen Escobar MRN: [id number] DOB: 1945-09-23 Today's Date: 06/01/2018    History of Present Illness 73 yo female s/p R TKA 05/31/18. Hx of L THA 2013, breast ca, PEG    PT Comments    Progressing well. Pt has met all PT goals this a.m. however pt stated she has chosen to remain in hospital until tomorrow. Will continue to follow and progress activity.    Follow Up Recommendations  Follow surgeon's recommendation for DC plan and follow-up therapies     Equipment Recommendations  None recommended by PT    Recommendations for Other Services       Precautions / Restrictions Precautions Precautions: Fall Required Braces or Orthoses: Knee Immobilizer - Right Knee Immobilizer - Right: Discontinue once straight leg raise with < 10 degree lag(able to SLR 2/11) Restrictions Weight Bearing Restrictions: No Other Position/Activity Restrictions: WBAT    Mobility  Bed Mobility               General bed mobility comments: oob in recliner  Transfers Overall transfer level: Needs assistance Equipment used: Rolling walker (2 wheeled) Transfers: Sit to/from Stand Sit to Stand: Min guard         General transfer comment: close guard for safety. VCs safety, technique, hand/LE placement.   Ambulation/Gait Ambulation/Gait assistance: Min guard Gait Distance (Feet): 200 Feet Assistive device: Rolling walker (2 wheeled) Gait Pattern/deviations: Step-to pattern;Step-through pattern;Decreased stride length     General Gait Details: Close guard for safety.    Stairs  Min guard assist  Step to pattern; Forwards; R handrail  Up and over portable steps using 2 hands on 1 handrail. VCs safety, technique, sequence.            Wheelchair Mobility    Modified Rankin (Stroke Patients Only)       Balance                                            Cognition Arousal/Alertness: Awake/alert Behavior During  Therapy: WFL for tasks assessed/performed Overall Cognitive Status: Within Functional Limits for tasks assessed                                        Exercises Total Joint Exercises Ankle Circles/Pumps: AROM;Both;10 reps;Supine Quad Sets: AROM;Both;10 reps;Supine Heel Slides: AAROM;Right;10 reps;Supine Hip ABduction/ADduction: Right;10 reps;Supine;AROM Straight Leg Raises: AROM;Right;10 reps;Supine Goniometric ROM: ~10-65 degrees    General Comments        Pertinent Vitals/Pain Pain Assessment: 0-10 Pain Score: 4  Pain Location: R knee Pain Descriptors / Indicators: Sore;Aching Pain Intervention(s): Monitored during session;Ice applied;Repositioned    Home Living                      Prior Function            PT Goals (current goals can now be found in the care plan section) Progress towards PT goals: Progressing toward goals    Frequency    7X/week      PT Plan Current plan remains appropriate    Co-evaluation              AM-PAC PT "6 Clicks" Mobility   Outcome Measure  Help needed turning from your back to your side  while in a flat bed without using bedrails?: A Little Help needed moving from lying on your back to sitting on the side of a flat bed without using bedrails?: A Little Help needed moving to and from a bed to a chair (including a wheelchair)?: A Little Help needed standing up from a chair using your arms (e.g., wheelchair or bedside chair)?: A Little Help needed to walk in hospital room?: A Little Help needed climbing 3-5 steps with a railing? : A Little 6 Click Score: 18    End of Session Equipment Utilized During Treatment: Gait belt Activity Tolerance: Patient tolerated treatment well Patient left: in chair;with call bell/phone within reach;with family/visitor present   PT Visit Diagnosis: Unsteadiness on feet (R26.81);Other abnormalities of gait and mobility (R26.89);Pain Pain - Right/Left: Right Pain -  part of body: Knee     Time: 1537-9432 PT Time Calculation (min) (ACUTE ONLY): 14 min  Charges:  $Gait Training: 8-22 mins                        Weston Anna, [place] Pager: [phone] Office: [phone]

## 2018-06-02 LAB — BASIC METABOLIC PANEL
Anion gap: 9 (ref 5–15)
BUN: 29 mg/dL — ABNORMAL HIGH (ref 8–23)
CO2: 26 mmol/L (ref 22–32)
Calcium: 8.4 mg/dL — ABNORMAL LOW (ref 8.9–10.3)
Chloride: 103 mmol/L (ref 98–111)
Creatinine, Ser: 0.85 mg/dL (ref 0.44–1.00)
GFR calc Af Amer: >60 mL/min (ref >60)
GFR calc non Af Amer: >60 mL/min (ref >60)
GLUCOSE: 173 mg/dL — AB (ref 70–99)
Potassium: 4.6 mmol/L (ref 3.5–5.1)
Sodium: 138 mmol/L (ref 135–145)

## 2018-06-02 LAB — CBC
HCT: 35.1 % — ABNORMAL LOW (ref 36.0–46.0)
Hemoglobin: 10.7 g/dL — ABNORMAL LOW (ref 12.0–15.0)
MCH: 27.2 pg (ref 26.0–34.0)
MCHC: 30.5 g/dL (ref 30.0–36.0)
MCV: 89.3 fL (ref 80.0–100.0)
Platelets: 176 10*3/uL (ref 150–400)
RBC: 3.93 MIL/uL (ref 3.87–5.11)
RDW: 15.8 % — ABNORMAL HIGH (ref 11.5–15.5)
WBC: 15.6 10*3/uL — ABNORMAL HIGH (ref 4.0–10.5)
nRBC: 0 % (ref 0.0–0.2)

## 2018-06-02 LAB — HEMOGLOBIN A1C
Hgb A1c MFr Bld: 5.7 % — ABNORMAL HIGH (ref 4.8–5.6)
Mean Plasma Glucose: 116.89 mg/dL

## 2018-06-02 LAB — GLUCOSE, CAPILLARY
Glucose-Capillary: 164 mg/dL — ABNORMAL HIGH (ref 70–99)
Glucose-Capillary: 107 mg/dL — ABNORMAL HIGH (ref 70–99)

## 2018-06-02 NOTE — Progress Notes (Signed)
   Subjective: 2 Days Post-Op Procedure(s) (LRB): TOTAL KNEE ARTHROPLASTY (Right) Patient reports pain as mild.   Patient seen in rounds for Dr. Wynelle Link. Patient is well, and has had no acute complaints or problems. States she is feeling well this AM and is ready to go home. Denies chest pain or SOB. Voiding without difficulty and positive flatus.  Plan is to go Home after hospital stay.  Objective: Vital signs in last 24 hours: Temp:  [97.6 F (36.4 C)-97.8 F (36.6 C)] 97.8 F (36.6 C) (02/12 0610) Pulse Rate:  [58-77] 77 (02/12 0610) Resp:  [14-17] 16 (02/12 0610) BP: (132-149)/(52-65) 149/59 (02/12 0610) SpO2:  [94 %-97 %] 97 % (02/12 0610) Weight:  [89.4 kg] 89.4 kg (02/12 0500)  Intake/Output from previous day:  Intake/Output Summary (Last 24 hours) at 06/02/2018 0957 Last data filed at 06/02/2018 0942 Gross per 24 hour  Intake 2153.81 ml  Output 1150 ml  Net 1003.81 ml    Intake/Output this shift: Total I/O In: 120 [Other:120] Out: -   Labs: Recent Labs    06/01/18 0423 06/02/18 0535  HGB 10.5* 10.7*   Recent Labs    06/01/18 0423 06/02/18 0535  WBC 14.0* 15.6*  RBC 3.84* 3.93  HCT 34.7* 35.1*  PLT 176 176   Recent Labs    06/01/18 0423 06/02/18 0535  NA 137 138  K 4.3 4.6  CL 106 103  CO2 21* 26  BUN 24* 29*  CREATININE 0.89 0.85  GLUCOSE 131* 173*  CALCIUM 8.3* 8.4*   No results for input(s): LABPT, INR in the last 72 hours.  Exam: General - Patient is Alert and Oriented Extremity - Neurologically intact Neurovascular intact Sensation intact distally Dorsiflexion/Plantar flexion intact Dressing/Incision - clean, dry, no drainage Motor Function - intact, moving foot and toes well on exam.   Past Medical History:  Diagnosis Date  . Acute respiratory failure with hypoxia ([place]) 02/16/2017  . Arthritis   . Cancer ([place]) 03/2009   breast- rt  . 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 GERD (gastroesophageal reflux disease)    . History of radiation therapy 07/12/10,completed   right breast 60 Gy x30 fx  . Hypertension   . Hypothyroidism   . Obesity   . Peripheral vascular disease ([place]) 1995   PT DEVELOPED CIRCULATION PROBLEMS IN BOTH HANDS AND GANGRENE OF BOTH INDEX FINGERS--REQUIRING AMPUTATION OF THE INDEX FINGERS AND VASCULAR SURGERY.  PT TOLD HER PROBLEMS RELATED TO SMOKING.   NO OTHER PROBLEMS SINCE.  Marland Kitchen Personal history of radiation therapy 2012   Right Breast Cancer  . Pneumonia   . Thyroid mass     Assessment/Plan: 2 Days Post-Op Procedure(s) (LRB): TOTAL KNEE ARTHROPLASTY (Right) Principal Problem:   OA (osteoarthritis) of knee  Estimated body mass index is 36.05 kg/m as calculated from the following:   Height as of this encounter: 5\' 2"  (1.575 m).   Weight as of this encounter: 89.4 kg. Up with therapy D/C IV fluids  DVT Prophylaxis - Xarelto Weight-bearing as tolerated  Plan for discharge after one session of physical therapy this AM. Scheduled for outpatient physical therapy at [facility]. Follow-up in the office in 2 weeks.   Theresa Duty, PA-C Orthopedic Surgery 06/02/2018, 9:57 AM

## 2018-06-02 NOTE — Progress Notes (Signed)
CBG 191 and recent CBG values called to [facility]. Received order to add Hgb A1c to AM lab draw.  Patient will also F/U with PMD after discharge.  Informed Patient of new order.

## 2018-06-02 NOTE — Progress Notes (Signed)
Physical Therapy Treatment Patient Details Name: Kristen Escobar MRN: [id number] DOB: [birth year] Today's Date: 06/02/2018    History of Present Illness 73 yo female s/p R TKA 05/31/18. Hx of L THA 2013, breast ca, PEG    PT Comments    POD # 2 am session Spouse present during session.  Assisted with amb a greater distance in hallway.  Practiced stairs. Then returned to room to perform some TE's following HEP handout.  Instructed on proper tech, freq as well as use of ICE.  Marland Kitchen Addressed all mobility questions, discussed appropriate activity, educated on use of ICE.  Pt ready for D/C to home.   Follow Up Recommendations  Follow surgeon's recommendation for DC plan and follow-up therapies     Equipment Recommendations  None recommended by PT    Recommendations for Other Services       Precautions / Restrictions Precautions Precautions: Fall Restrictions Weight Bearing Restrictions: No Other Position/Activity Restrictions: WBAT    Mobility  Bed Mobility               General bed mobility comments: OOB in recliner   Transfers Overall transfer level: Needs assistance Equipment used: Rolling walker (2 wheeled) Transfers: Sit to/from Stand Sit to Stand: Supervision         General transfer comment: one VC safety with turns  Ambulation/Gait Ambulation/Gait assistance: Supervision Gait Distance (Feet): 220 Feet Assistive device: Rolling walker (2 wheeled) Gait Pattern/deviations: Step-to pattern;Step-through pattern;Decreased stride length     General Gait Details:  for safety.    Stairs Stairs: Yes Stairs assistance: Min guard;Supervision Stair Management: One rail Right;Step to pattern;Forwards Number of Stairs: 4 General stair comments: with spouse "hands on" instructed on safe handling   Wheelchair Mobility    Modified Rankin (Stroke Patients Only)       Balance                                            Cognition  Arousal/Alertness: Awake/alert Behavior During Therapy: WFL for tasks assessed/performed Overall Cognitive Status: Within Functional Limits for tasks assessed                                        Exercises   Total Knee Replacement TE's 10 reps B LE ankle pumps 10 reps towel squeezes 10 reps knee presses 10 reps heel slides  10 reps SAQ's 10 reps SLR's 10 reps ABD Followed by ICE    General Comments        Pertinent Vitals/Pain Pain Assessment: 0-10 Pain Score: 3  Pain Location: R knee Pain Descriptors / Indicators: Sore;Aching;Operative site guarding Pain Intervention(s): Monitored during session;Premedicated before session;Ice applied    Home Living                      Prior Function            PT Goals (current goals can now be found in the care plan section) Progress towards PT goals: Progressing toward goals    Frequency    7X/week      PT Plan Current plan remains appropriate    Co-evaluation              AM-PAC PT "6 Clicks" Mobility   Outcome Measure  Help  needed turning from your back to your side while in a flat bed without using bedrails?: A Little Help needed moving from lying on your back to sitting on the side of a flat bed without using bedrails?: A Little Help needed moving to and from a bed to a chair (including a wheelchair)?: A Little Help needed standing up from a chair using your arms (e.g., wheelchair or bedside chair)?: A Little Help needed to walk in hospital room?: A Little Help needed climbing 3-5 steps with a railing? : A Little 6 Click Score: 18    End of Session Equipment Utilized During Treatment: Gait belt Activity Tolerance: Patient tolerated treatment well Patient left: in bed;with call bell/phone within reach Nurse Communication: (pt readu for D/C to home) PT Visit Diagnosis: Unsteadiness on feet (R26.81);Other abnormalities of gait and mobility (R26.89);Pain Pain - Right/Left:  Right Pain - part of body: Knee     Time: 1020-1048 PT Time Calculation (min) (ACUTE ONLY): 28 min  Charges:  $Gait Training: 8-22 mins $Self Care/Home Management: [place]  PTA Acute  Rehabilitation Services Pager      [phone] Office      [phone]

## 2018-06-03 ENCOUNTER — Emergency Department (HOSPITAL_BASED_OUTPATIENT_CLINIC_OR_DEPARTMENT_OTHER)
Admission: EM | Admit: 2018-06-03 | Discharge: 2018-06-03 | Disposition: A | Discharge status: Home / Self Care | Payer: Medicare Other | Attending: Emergency Medicine | Admitting: Emergency Medicine

## 2018-06-03 ENCOUNTER — Encounter (HOSPITAL_BASED_OUTPATIENT_CLINIC_OR_DEPARTMENT_OTHER): Payer: Self-pay | Admitting: *Deleted

## 2018-06-03 ENCOUNTER — Other Ambulatory Visit: Payer: Self-pay

## 2018-06-03 DIAGNOSIS — E039 Hypothyroidism, unspecified: Secondary | ICD-10-CM | POA: Insufficient documentation

## 2018-06-03 DIAGNOSIS — R339 Retention of urine, unspecified: Secondary | ICD-10-CM

## 2018-06-03 DIAGNOSIS — Z96651 Presence of right artificial knee joint: Secondary | ICD-10-CM | POA: Insufficient documentation

## 2018-06-03 DIAGNOSIS — N189 Chronic kidney disease, unspecified: Secondary | ICD-10-CM | POA: Diagnosis not present

## 2018-06-03 DIAGNOSIS — I129 Hypertensive chronic kidney disease with stage 1 through stage 4 chronic kidney disease, or unspecified chronic kidney disease: Secondary | ICD-10-CM | POA: Insufficient documentation

## 2018-06-03 DIAGNOSIS — Z79899 Other long term (current) drug therapy: Secondary | ICD-10-CM | POA: Insufficient documentation

## 2018-06-03 DIAGNOSIS — Z7901 Long term (current) use of anticoagulants: Secondary | ICD-10-CM | POA: Diagnosis not present

## 2018-06-03 DIAGNOSIS — Z87891 Personal history of nicotine dependence: Secondary | ICD-10-CM | POA: Insufficient documentation

## 2018-06-03 LAB — COMPREHENSIVE METABOLIC PANEL
ALBUMIN: 3.3 g/dL — AB (ref 3.5–5.0)
ALT: 16 U/L (ref 0–44)
AST: 29 U/L (ref 15–41)
Alkaline Phosphatase: 85 U/L (ref 38–126)
Anion gap: 9 (ref 5–15)
BUN: 33 mg/dL — ABNORMAL HIGH (ref 8–23)
CO2: 24 mmol/L (ref 22–32)
Calcium: 8.6 mg/dL — ABNORMAL LOW (ref 8.9–10.3)
Chloride: 99 mmol/L (ref 98–111)
Creatinine, Ser: 1.05 mg/dL — ABNORMAL HIGH (ref 0.44–1.00)
GFR calc Af Amer: >60 mL/min (ref >60)
GFR calc non Af Amer: 53 mL/min — ABNORMAL LOW (ref >60)
GLUCOSE: 179 mg/dL — AB (ref 70–99)
Potassium: 4.5 mmol/L (ref 3.5–5.1)
Sodium: 132 mmol/L — ABNORMAL LOW (ref 135–145)
Total Bilirubin: 0.8 mg/dL (ref 0.3–1.2)
Total Protein: 6.5 g/dL (ref 6.5–8.1)

## 2018-06-03 LAB — CBC WITH DIFFERENTIAL/PLATELET
Abs Immature Granulocytes: 0.05 10*3/uL (ref 0.00–0.07)
BASOS ABS: 0 10*3/uL (ref 0.0–0.1)
Basophils Relative: 0 %
EOS PCT: 0 %
Eosinophils Absolute: 0 10*3/uL (ref 0.0–0.5)
HCT: 40.3 % (ref 36.0–46.0)
Hemoglobin: 12.8 g/dL (ref 12.0–15.0)
Immature Granulocytes: 0 %
Lymphocytes Relative: 7 %
Lymphs Abs: 0.9 10*3/uL (ref 0.7–4.0)
MCH: 27.5 pg (ref 26.0–34.0)
MCHC: 31.8 g/dL (ref 30.0–36.0)
MCV: 86.7 fL (ref 80.0–100.0)
Monocytes Absolute: 0.9 10*3/uL (ref 0.1–1.0)
Monocytes Relative: 6 %
NRBC: 0 % (ref 0.0–0.2)
Neutro Abs: 11.5 10*3/uL — ABNORMAL HIGH (ref 1.7–7.7)
Neutrophils Relative %: 87 %
Platelets: 213 10*3/uL (ref 150–400)
RBC: 4.65 MIL/uL (ref 3.87–5.11)
RDW: 15.9 % — ABNORMAL HIGH (ref 11.5–15.5)
WBC: 13.3 10*3/uL — ABNORMAL HIGH (ref 4.0–10.5)

## 2018-06-03 LAB — URINALYSIS, MICROSCOPIC (REFLEX)

## 2018-06-03 LAB — URINALYSIS, ROUTINE W REFLEX MICROSCOPIC
Bilirubin Urine: NEGATIVE
Glucose, UA: NEGATIVE mg/dL
Ketones, ur: NEGATIVE mg/dL
Leukocytes,Ua: NEGATIVE
Nitrite: NEGATIVE
Protein, ur: NEGATIVE mg/dL
Specific Gravity, Urine: 1.01 (ref 1.005–1.030)
pH: 7.5 (ref 5.0–8.0)

## 2018-06-03 NOTE — ED Notes (Signed)
Pt verbalizes understanding of d/c instructions and denies any further needs at this time. 

## 2018-06-03 NOTE — Discharge Instructions (Addendum)
Call the urologist tomorrow to set up an appointment for further evaluation of your urinary retention and for Foley removal. Return to the emergency room if he develop fevers, severe worsening abdominal pain, or any new, worsening, or concerning symptoms.

## 2018-06-03 NOTE — ED Provider Notes (Signed)
[facility] EMERGENCY DEPARTMENT Provider Note   CSN: [id number] Arrival date & time: 06/03/18  1449     History   Chief Complaint Chief Complaint  Patient presents with  . Urinary Retention    HPI Kristen Escobar is a 73 y.o. female for evaluation of urinary retention.  Patient states she had a total knee replacement on Monday, 4 days ago.  She was having no difficulties with urination until last night.  She has not urinated since last night, and is having severe lower abdominal pain.  Patient had similar urinary retention following another surgery.  She denies fevers, chills, nausea, or vomiting.  Higher to retention, she was not having any dysuria or hematuria.  Patient states she does not normally have issues with urinary retention.  She has been having loose stools today.  Pain is only in the lower abdomen, is constant and severe.  Nothing makes it better or worse.  She denies significant or worsening knee pain.  HPI  Past Medical History:  Diagnosis Date  . Acute respiratory failure with hypoxia ([place]) 02/16/2017  . Arthritis   . Cancer ([place]) 03/2009   breast- rt  . 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 GERD (gastroesophageal reflux disease)   . History of radiation therapy 07/12/10,completed   right breast 60 Gy x30 fx  . Hypertension   . Hypothyroidism   . Obesity   . Peripheral vascular disease ([place]) 1995   PT DEVELOPED CIRCULATION PROBLEMS IN BOTH HANDS AND GANGRENE OF BOTH INDEX FINGERS--REQUIRING AMPUTATION OF THE INDEX FINGERS AND VASCULAR SURGERY.  PT TOLD HER PROBLEMS RELATED TO SMOKING.   NO OTHER PROBLEMS SINCE.  Marland Kitchen Personal history of radiation therapy 2012   Right Breast Cancer  . Pneumonia   . Thyroid mass     Patient Active Problem List   Diagnosis Date Noted  . Buerger's disease ([place]) 03/31/2018  . Pressure injury of skin 03/13/2017  . Intracranial hemorrhage ([place]) 03/12/2017  . SDH (subdural hematoma) ([place]) 03/11/2017  .  Uses feeding tube 03/11/2017  . Hyperglycemia 03/11/2017  . Left maxillary sinusitis 03/11/2017  . 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 Pulmonary embolus ([place]) 02/22/2017  . Port or reservoir infection 01/20/2017  . Port-A-Cath in place 01/20/2017  . Diffuse large B cell lymphoma ([place]) 01/01/2017  . Neck mass 12/26/2016  . Esophageal mass 11/27/2016  . Hypothyroidism 03/25/2016  . Bilateral carotid bruits 08/22/2015  . Impaired glucose tolerance 08/22/2015  . Chronic kidney disease 08/22/2015  . Benign paroxysmal positional vertigo 02/05/2013  . S/P left THA, AA 01/20/2012  . OA (osteoarthritis) of knee 09/24/2011  . Breast cancer of upper-outer quadrant of right female breast ([place]) 03/21/2011  . Morbid obesity ([place]) 08/02/2009  . Essential hypertension 08/02/2009    Past Surgical History:  Procedure Laterality Date  . AMPUTATION     partial amputation of both index fingers  . BREAST LUMPECTOMY Right 04/01/2010  . BREAST SURGERY  2011   lumpectomy with node sampling- RIGHT  . COLONOSCOPY    . ESOPHAGOGASTRODUODENOSCOPY    . EXCISION MASS NECK Left 12/26/2016   Procedure: EXCISION MASS NECK;  Surgeon: Izora Gala, MD;  Location: [facility];  Service: ENT;  Laterality: Left;  open excision of thyroid mass left side with frozen section  . HAND SURGERY Bilateral 1995   Amputaed pointer fingers bilaterally  . IR FLUORO GUIDE PORT INSERTION RIGHT  01/14/2017  . IR GASTROSTOMY TUBE MOD SED  02/19/2017  .  IR REMOVAL TUN ACCESS W/ PORT W/O FL MOD SED  11/30/2017  . IR REPLACE G-TUBE SIMPLE WO FLUORO  08/03/2017  . IR REPLC GASTRO/COLONIC TUBE PERCUT W/FLUORO  02/25/2017  . IR [place] TUBE PERCUT W/FLUORO  03/09/2017  . IR US GUIDE VASC ACCESS RIGHT  01/14/2017  . JOINT REPLACEMENT    . TOTAL HIP ARTHROPLASTY  12/16/2011   right hip  . TOTAL HIP ARTHROPLASTY  01/20/2012   Procedure: TOTAL HIP ARTHROPLASTY ANTERIOR APPROACH;  Surgeon: Mauri Pole, MD;  Location: [facility];   Service: Orthopedics;  Laterality: Left;  . TOTAL KNEE ARTHROPLASTY Right 05/31/2018   Procedure: TOTAL KNEE ARTHROPLASTY;  Surgeon: Gaynelle Arabian, MD;  Location: [facility];  Service: Orthopedics;  Laterality: Right;  Adductor Block  . TUBAL LIGATION    . VASCULAR SURGERY     both hands     OB History   No obstetric history on file.      Home Medications    Prior to Admission medications   Medication Sig Start Date End Date Taking? Authorizing Provider  furosemide (LASIX) 20 MG tablet TAKE 1 TABLET BY MOUTH TWICE A DAY Patient taking differently: Place 20 mg into feeding tube daily.  02/04/18   Nicholas Lose, MD  levothyroxine (SYNTHROID, LEVOTHROID) 50 MCG tablet TAKE 1 TABLET BY MOUTH ONCE DAILY Patient taking differently: Place 50 mcg into feeding tube daily.  12/07/17   Marletta Lor, MD  loperamide (IMODIUM A-D) 2 MG tablet Place 2 mg into feeding tube 4 (four) times daily as needed for diarrhea or loose stools.    [provider]  methocarbamol (ROBAXIN) 500 MG tablet Take 1 tablet (500 mg total) by mouth every 6 (six) hours as needed for muscle spasms. 06/01/18   Edmisten, Ok Anis, PA  omeprazole (PRILOSEC OTC) 20 MG tablet Take 20 mg per tube daily    [provider]  oxyCODONE (ROXICODONE) 5 MG/5ML solution Take 5-10 mLs (5-10 mg total) by mouth every 6 (six) hours as needed for up to 7 days for severe pain. 06/01/18 06/08/18  Edmisten, Ok Anis, PA  rivaroxaban (XARELTO) 10 MG TABS tablet Take 1 tablet (10 mg total) by mouth daily with breakfast for 20 days. Then take one 81 mg aspirin once a day for three weeks. Then discontinue aspirin. 06/01/18 06/21/18  Edmisten, Ok Anis, PA  traMADol (ULTRAM) 50 MG tablet Take 1-2 tablets (50-100 mg total) by mouth every 6 (six) hours as needed for moderate pain. 06/01/18   Edmisten, Ok Anis, PA    Family History Family History  Problem Relation Age of Onset  . Cancer Mother        pt unaware of what kind  .  Hearing loss Mother   . Coronary artery disease Father   . Diabetes Father   . Coronary artery disease Brother   . Coronary artery disease Brother   . Diabetes Sister   . Cancer Sister        breast  . Colon cancer Neg Hx   . Stomach cancer Neg Hx   . Rectal cancer Neg Hx   . Esophageal cancer Neg Hx     Social History Social History   Tobacco Use  . Smoking status: Former Smoker    Packs/day: 1.00    Years: 20.00    Pack years: 20.00    Last attempt to quit: 04/21/1993    Years since quitting: 25.1  . Smokeless tobacco: Never Used  Substance Use  Topics  . Alcohol use: Yes    Comment: OCCAS - MAYBE ONCE A MONTH  . Drug use: No     Allergies   Patient has no known allergies.   Review of Systems Review of Systems  Gastrointestinal: Positive for abdominal pain (lower abd).  Genitourinary: Positive for difficulty urinating.  All other systems reviewed and are negative.    Physical Exam Updated Vital Signs BP (!) 152/57 (BP Location: Left Wrist)   Pulse 74   Temp 97.8 F (36.6 C) (Oral)   Resp 16   Ht 5\' 2"  (1.575 m)   Wt 89 kg   SpO2 97%   BMI 35.89 kg/m   Physical Exam Vitals signs and nursing note reviewed.  Constitutional:      General: She is not in acute distress.    Appearance: She is well-developed.     Comments: Obese female who appears uncomfortable due to pain  HENT:     Head: Normocephalic and atraumatic.  Eyes:     Conjunctiva/sclera: Conjunctivae normal.     Pupils: Pupils are equal, round, and reactive to light.  Neck:     Musculoskeletal: Normal range of motion and neck supple.  Cardiovascular:     Rate and Rhythm: Normal rate and regular rhythm.  Pulmonary:     Effort: Pulmonary effort is normal. No respiratory distress.     Breath sounds: Normal breath sounds. No wheezing.  Abdominal:     General: There is no distension.     Palpations: Abdomen is soft. There is no mass.     Tenderness: There is abdominal tenderness. There is no  guarding or rebound.     Comments: TTP of lower abd/suprapubic abd. No ttp elsewhere in the abd. Soft without rigidity or distention  Musculoskeletal: Normal range of motion.     Comments: Right knee with well-healing incisions without drainage or signs of infection.  Skin:    General: Skin is warm and dry.     Capillary Refill: Capillary refill takes less than 2 seconds.  Neurological:     Mental Status: She is alert and oriented to person, place, and time.      ED Treatments / Results  Labs (all labs ordered are listed, but only abnormal results are displayed) Labs Reviewed  CBC WITH DIFFERENTIAL/PLATELET - Abnormal; Notable for the following components:      Result Value   WBC 13.3 (*)    RDW 15.9 (*)    Neutro Abs 11.5 (*)    All other components within normal limits  URINALYSIS, ROUTINE W REFLEX MICROSCOPIC - Abnormal; Notable for the following components:   Hgb urine dipstick TRACE (*)    All other components within normal limits  COMPREHENSIVE METABOLIC PANEL - Abnormal; Notable for the following components:   Sodium 132 (*)    Glucose, Bld 179 (*)    BUN 33 (*)    Creatinine, Ser 1.05 (*)    Calcium 8.6 (*)    Albumin 3.3 (*)    GFR calc non Af Amer 53 (*)    All other components within normal limits  URINALYSIS, MICROSCOPIC (REFLEX) - Abnormal; Notable for the following components:   Bacteria, UA RARE (*)    All other components within normal limits  URINE CULTURE    EKG None  Radiology No results found.  Procedures Procedures (including critical care time)  Medications Ordered in ED Medications - No data to display   Initial Impression / Assessment and Plan / ED  Course  I have reviewed the triage vital signs and the nursing notes.  Pertinent labs & imaging results that were available during my care of the patient were reviewed by me and considered in my medical decision making (see chart for details).     Patient presenting for evaluation of  urinary retention.  Physical exam shows patient who appears uncomfortable due to pain, no acute distress.  She is afebrile not tachycardic.  No recent infectious symptoms.  Abdominal tenderness only over the suprapubic area.  As such, will place Foley catheter, send urine, obtain labs, and reassess.  Foley catheter put out 1300 cc.  Urine negative for infection.  Labs reassuring, creatinine stable.  Patient does have a white count of 13.3.  This is likely active due to recent surgery.  As urine is without infection, she is without fevers or infectious symptoms, doubt infection at this time.  Patient discharged with Foley, and instructions to follow-up with urology for further evaluation and Foley removal.  Case discussed with attending, Dr. Laverta Baltimore agrees to plan.  At this time, patient appears safe for d/c. Return precautions given. Patient states she understands and agrees plan.   Final Clinical Impressions(s) / ED Diagnoses   Final diagnoses:  Urinary retention    ED Discharge Orders    None       Franchot Heidelberg, PA-C 06/03/18 2126    Margette Fast, MD 06/04/18 1030

## 2018-06-03 NOTE — ED Triage Notes (Signed)
Pt c/o urinary retention, post knee replacement x 3 days ago

## 2018-06-04 DIAGNOSIS — M25561 Pain in right knee: Secondary | ICD-10-CM | POA: Diagnosis not present

## 2018-06-05 LAB — URINE CULTURE: Culture: NO GROWTH

## 2018-06-07 DIAGNOSIS — M25561 Pain in right knee: Secondary | ICD-10-CM | POA: Diagnosis not present

## 2018-06-07 NOTE — Discharge Summary (Signed)
Physician Discharge Summary   Patient ID: Kristen Escobar MRN: [id number] DOB/AGE: 1945/08/21 73 y.o.  Admit date: 05/31/2018 Discharge date: 06/02/2018  Primary Diagnosis: Osteoarthritis, right knee   Admission Diagnoses:  Past Medical History:  Diagnosis Date  . Acute respiratory failure with hypoxia ([place]) 02/16/2017  . Arthritis   . Cancer ([place]) 03/2009   breast- rt  . 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 GERD (gastroesophageal reflux disease)   . History of radiation therapy 07/12/10,completed   right breast 60 Gy x30 fx  . Hypertension   . Hypothyroidism   . Obesity   . Peripheral vascular disease ([place]) 1995   PT DEVELOPED CIRCULATION PROBLEMS IN BOTH HANDS AND GANGRENE OF BOTH INDEX FINGERS--REQUIRING AMPUTATION OF THE INDEX FINGERS AND VASCULAR SURGERY.  PT TOLD HER PROBLEMS RELATED TO SMOKING.   NO OTHER PROBLEMS SINCE.  Marland Kitchen Personal history of radiation therapy 2012   Right Breast Cancer  . Pneumonia   . Thyroid mass    Discharge Diagnoses:   Principal Problem:   OA (osteoarthritis) of knee  Estimated body mass index is 36.05 kg/m as calculated from the following:   Height as of this encounter: 5\' 2"  (1.575 m).   Weight as of this encounter: 89.4 kg.  Procedure:  Procedure(s) (LRB): TOTAL KNEE ARTHROPLASTY (Right)   Consults: None  HPI: Kristen Escobar is a 73 y.o. year old female with end stage OA of her right knee with progressively worsening pain and dysfunction. She has constant pain, with activity and at rest and significant functional deficits with difficulties even with ADLs. She has had extensive non-op management including analgesics, injections of cortisone and viscosupplements, and home exercise program, but remains in significant pain with significant dysfunction.Radiographs show bone on bone arthritis lateral and patellofemoral. She presents now for right Total Knee Arthroplasty.    Laboratory Data: Admission on 05/31/2018, Discharged on  06/02/2018  Component Date Value Ref Range Status  . WBC 06/01/2018 14.0* 4.0 - 10.5 K/uL Final  . RBC 06/01/2018 3.84* 3.87 - 5.11 MIL/uL Final  . Hemoglobin 06/01/2018 10.5* 12.0 - 15.0 g/dL Final  . HCT 06/01/2018 34.7* 36.0 - 46.0 % Final  . MCV 06/01/2018 90.4  80.0 - 100.0 fL Final  . MCH 06/01/2018 27.3  26.0 - 34.0 pg Final  . MCHC 06/01/2018 30.3  30.0 - 36.0 g/dL Final  . RDW 06/01/2018 15.3  11.5 - 15.5 % Final  . Platelets 06/01/2018 176  150 - 400 K/uL Final  . nRBC 06/01/2018 0.0  0.0 - 0.2 % Final   Performed at [facility], [place] [address], [place], [place] [address]  . Sodium 06/01/2018 137  135 - 145 mmol/L Final  . Potassium 06/01/2018 4.3  3.5 - 5.1 mmol/L Final  . Chloride 06/01/2018 106  98 - 111 mmol/L Final  . CO2 06/01/2018 21* 22 - 32 mmol/L Final  . Glucose, Bld 06/01/2018 131* 70 - 99 mg/dL Final  . BUN 06/01/2018 24* 8 - 23 mg/dL Final  . Creatinine, Ser 06/01/2018 0.89  0.44 - 1.00 mg/dL Final  . Calcium 06/01/2018 8.3* 8.9 - 10.3 mg/dL Final  . GFR calc non Af Amer 06/01/2018 >60  >60 mL/min Final  . GFR calc Af Amer 06/01/2018 >60  >60 mL/min Final  . Anion gap 06/01/2018 10  5 - 15 Final   Performed at [facility], [place] [address], [place], [place] [address]  . Glucose-Capillary 06/01/2018 270* 70 - 99  mg/dL Final  . Glucose-Capillary 06/01/2018 120* 70 - 99 mg/dL Final  . Glucose-Capillary 06/01/2018 127* 70 - 99 mg/dL Final  . Glucose-Capillary 06/01/2018 124* 70 - 99 mg/dL Final  . WBC 06/02/2018 15.6* 4.0 - 10.5 K/uL Final  . RBC 06/02/2018 3.93  3.87 - 5.11 MIL/uL Final  . Hemoglobin 06/02/2018 10.7* 12.0 - 15.0 g/dL Final  . HCT 06/02/2018 35.1* 36.0 - 46.0 % Final  . MCV 06/02/2018 89.3  80.0 - 100.0 fL Final  . MCH 06/02/2018 27.2  26.0 - 34.0 pg Final  . MCHC 06/02/2018 30.5  30.0 - 36.0 g/dL Final  . RDW 06/02/2018 15.8* 11.5 - 15.5 % Final  . Platelets 06/02/2018 176  150 - 400 K/uL Final  . nRBC  06/02/2018 0.0  0.0 - 0.2 % Final   Performed at [name], [place] [address] [place], [place] [address]  . Sodium 06/02/2018 138  135 - 145 mmol/L Final  . Potassium 06/02/2018 4.6  3.5 - 5.1 mmol/L Final  . Chloride 06/02/2018 103  98 - 111 mmol/L Final  . CO2 06/02/2018 26  22 - 32 mmol/L Final  . Glucose, Bld 06/02/2018 173* 70 - 99 mg/dL Final  . BUN 06/02/2018 29* 8 - 23 mg/dL Final  . Creatinine, Ser 06/02/2018 0.85  0.44 - 1.00 mg/dL Final  . Calcium 06/02/2018 8.4* 8.9 - 10.3 mg/dL Final  . GFR calc non Af Amer 06/02/2018 >60  >60 mL/min Final  . GFR calc Af Amer 06/02/2018 >60  >60 mL/min Final  . Anion gap 06/02/2018 9  5 - 15 Final   Performed at [facility], [place] [address] [place], [place] [address]  . Glucose-Capillary 06/01/2018 124* 70 - 99 mg/dL Final  . Glucose-Capillary 06/01/2018 118* 70 - 99 mg/dL Final  . Glucose-Capillary 06/01/2018 191* 70 - 99 mg/dL Final  . Hgb A1c MFr Bld 06/02/2018 5.7* 4.8 - 5.6 % Final   Comment: (NOTE) Pre diabetes:          5.7%-6.4% Diabetes:              >6.4% Glycemic control for   <7.0% adults with diabetes   . Mean Plasma Glucose 06/02/2018 116.89  mg/dL Final   Performed at [place] [address] [place], [place] [address]  . Glucose-Capillary 06/02/2018 164* 70 - 99 mg/dL Final  . Glucose-Capillary 06/02/2018 107* 70 - 99 mg/dL Final  Hospital Outpatient Visit on 05/26/2018  Component Date Value Ref Range Status  . aPTT 05/26/2018 27  24 - 36 seconds Final   Performed at [facility], [place] [address], [place], [place] [address]  . WBC 05/26/2018 9.2  4.0 - 10.5 K/uL Final  . RBC 05/26/2018 4.83  3.87 - 5.11 MIL/uL Final  . Hemoglobin 05/26/2018 13.3  12.0 - 15.0 g/dL Final  . HCT 05/26/2018 43.8  36.0 - 46.0 % Final  . MCV 05/26/2018 90.7  80.0 - 100.0 fL Final  . MCH 05/26/2018 27.5  26.0 - 34.0 pg Final  . MCHC 05/26/2018 30.4  30.0 - 36.0 g/dL Final    . RDW 05/26/2018 15.3  11.5 - 15.5 % Final  . Platelets 05/26/2018 219  150 - 400 K/uL Final  . nRBC 05/26/2018 0.0  0.0 - 0.2 % Final  . Neutrophils Relative % 05/26/2018 68  % Final  . Neutro Abs 05/26/2018 6.2  1.7 - 7.7 K/uL Final  . Lymphocytes Relative 05/26/2018 20  %  Final  . Lymphs Abs 05/26/2018 1.9  0.7 - 4.0 K/uL Final  . Monocytes Relative 05/26/2018 9  % Final  . Monocytes Absolute 05/26/2018 0.8  0.1 - 1.0 K/uL Final  . Eosinophils Relative 05/26/2018 2  % Final  . Eosinophils Absolute 05/26/2018 0.2  0.0 - 0.5 K/uL Final  . Basophils Relative 05/26/2018 1  % Final  . Basophils Absolute 05/26/2018 0.1  0.0 - 0.1 K/uL Final  . Immature Granulocytes 05/26/2018 0  % Final  . Abs Immature Granulocytes 05/26/2018 0.04  0.00 - 0.07 K/uL Final   Performed at [facility], [place] [address], [place], [place] [address]  . Sodium 05/26/2018 140  135 - 145 mmol/L Final  . Potassium 05/26/2018 4.7  3.5 - 5.1 mmol/L Final  . Chloride 05/26/2018 102  98 - 111 mmol/L Final  . CO2 05/26/2018 28  22 - 32 mmol/L Final  . Glucose, Bld 05/26/2018 129* 70 - 99 mg/dL Final  . BUN 05/26/2018 31* 8 - 23 mg/dL Final  . Creatinine, Ser 05/26/2018 0.96  0.44 - 1.00 mg/dL Final  . Calcium 05/26/2018 9.5  8.9 - 10.3 mg/dL Final  . Total Protein 05/26/2018 7.8  6.5 - 8.1 g/dL Final  . Albumin 05/26/2018 4.0  3.5 - 5.0 g/dL Final  . AST 05/26/2018 25  15 - 41 U/L Final  . ALT 05/26/2018 16  0 - 44 U/L Final  . Alkaline Phosphatase 05/26/2018 102  38 - 126 U/L Final  . Total Bilirubin 05/26/2018 0.7  0.3 - 1.2 mg/dL Final  . GFR calc non Af Amer 05/26/2018 60* >60 mL/min Final  . GFR calc Af Amer 05/26/2018 >60  >60 mL/min Final  . Anion gap 05/26/2018 10  5 - 15 Final   Performed at [facility], [place] [address] [place], [place] [address]  . Prothrombin Time 05/26/2018 13.3  11.4 - 15.2 seconds Final  . INR 05/26/2018 1.02   Final   Performed at [facility], [place] [address], [place], [place] [address]  . ABO/RH(D) 05/26/2018 A POS   Final  . Antibody Screen 05/26/2018 NEG   Final  . Sample Expiration 05/26/2018 06/03/2018   Final  . Extend sample reason 05/26/2018    Final                   Value:NO TRANSFUSIONS OR PREGNANCY IN THE PAST 3 MONTHS Performed at [facility], [place] [address], [place], [place] [address]   . Color, Urine 05/26/2018 YELLOW  YELLOW Final  . APPearance 05/26/2018 HAZY* CLEAR Final  . Specific Gravity, Urine 05/26/2018 1.011  1.005 - 1.030 Final  . pH 05/26/2018 7.0  5.0 - 8.0 Final  . Glucose, UA 05/26/2018 NEGATIVE  NEGATIVE mg/dL Final  . Hgb urine dipstick 05/26/2018 NEGATIVE  NEGATIVE Final  . Bilirubin Urine 05/26/2018 NEGATIVE  NEGATIVE Final  . Ketones, ur 05/26/2018 NEGATIVE  NEGATIVE mg/dL Final  . Protein, ur 05/26/2018 NEGATIVE  NEGATIVE mg/dL Final  . Nitrite 05/26/2018 NEGATIVE  NEGATIVE Final  . Leukocytes, UA 05/26/2018 NEGATIVE  NEGATIVE Final   Performed at [name], [place] [address] [place], [place] [address]  . MRSA, PCR 05/26/2018 NEGATIVE  NEGATIVE Final  . Staphylococcus aureus 05/26/2018 NEGATIVE  NEGATIVE Final   Comment: (NOTE) The Xpert SA Assay (FDA approved for NASAL specimens in patients 71 years of age and older), is one component of a comprehensive surveillance program.  It is not intended to diagnose infection nor to guide or monitor treatment. Performed at [facility], [place] [address] [place], [place] [address]   Appointment on 05/18/2018  Component Date Value Ref Range Status  . WBC Count 05/18/2018 9.3  4.0 - 10.5 K/uL Final  . RBC 05/18/2018 4.64  3.87 - 5.11 MIL/uL Final  . Hemoglobin 05/18/2018 12.9  12.0 - 15.0 g/dL Final  . HCT 05/18/2018 40.1  36.0 - 46.0 % Final  . MCV 05/18/2018 86.4  80.0 - 100.0 fL Final  . MCH 05/18/2018 27.8  26.0 - 34.0 pg Final  . MCHC 05/18/2018 32.2  30.0 - 36.0  g/dL Final  . RDW 05/18/2018 15.7* 11.5 - 15.5 % Final  . Platelet Count 05/18/2018 190  150 - 400 K/uL Final  . nRBC 05/18/2018 0.0  0.0 - 0.2 % Final  . Neutrophils Relative % 05/18/2018 70  % Final  . Neutro Abs 05/18/2018 6.5  1.7 - 7.7 K/uL Final  . Lymphocytes Relative 05/18/2018 17  % Final  . Lymphs Abs 05/18/2018 1.6  0.7 - 4.0 K/uL Final  . Monocytes Relative 05/18/2018 10  % Final  . Monocytes Absolute 05/18/2018 0.9  0.1 - 1.0 K/uL Final  . Eosinophils Relative 05/18/2018 3  % Final  . Eosinophils Absolute 05/18/2018 0.3  0.0 - 0.5 K/uL Final  . Basophils Relative 05/18/2018 0  % Final  . Basophils Absolute 05/18/2018 0.0  0.0 - 0.1 K/uL Final  . Immature Granulocytes 05/18/2018 0  % Final  . Abs Immature Granulocytes 05/18/2018 0.02  0.00 - 0.07 K/uL Final   Performed at [facility], [place] [address], [place], [place] [address]  . Sodium 05/18/2018 140  135 - 145 mmol/L Final  . Potassium 05/18/2018 4.7  3.5 - 5.1 mmol/L Final  . Chloride 05/18/2018 103  98 - 111 mmol/L Final  . CO2 05/18/2018 29  22 - 32 mmol/L Final  . Glucose, Bld 05/18/2018 98  70 - 99 mg/dL Final  . BUN 05/18/2018 30* 8 - 23 mg/dL Final  . Creatinine 05/18/2018 0.95  0.44 - 1.00 mg/dL Final  . Calcium 05/18/2018 9.1  8.9 - 10.3 mg/dL Final  . Total Protein 05/18/2018 7.1  6.5 - 8.1 g/dL Final  . Albumin 05/18/2018 3.4* 3.5 - 5.0 g/dL Final  . AST 05/18/2018 19  15 - 41 U/L Final  . ALT 05/18/2018 14  0 - 44 U/L Final  . Alkaline Phosphatase 05/18/2018 109  38 - 126 U/L Final  . Total Bilirubin 05/18/2018 0.5  0.3 - 1.2 mg/dL Final  . GFR, Est Non Af Am 05/18/2018 >60  >60 mL/min Final  . GFR, Est AFR Am 05/18/2018 >60  >60 mL/min Final  . Anion gap 05/18/2018 8  5 - 15 Final   Performed at [facility], [place] [address], [place], [place] [address]  . LDH 05/18/2018 168  98 - 192 U/L Final   Performed at [facility], [place]  [address], [place], [place] [address]     X-Rays:No results found.  EKG: Orders placed or performed in visit on 03/31/18  . EKG 12-Lead     Hospital Course: Josepha Barbier is a 73 y.o. who was admitted to [facility]. They were brought to the operating room on 05/31/2018 and underwent Procedure(s): TOTAL KNEE ARTHROPLASTY.  Patient tolerated the procedure well and was later transferred to the recovery room and  then to the orthopaedic floor for postoperative care. They were given PO and IV analgesics for pain control following their surgery. They were given 24 hours of postoperative antibiotics of  Anti-infectives (From admission, onward)   Start     Dose/Rate Route Frequency Ordered Stop   05/31/18 1630  ceFAZolin (ANCEF) IVPB 2g/100 mL premix     2 g 200 mL/hr over 30 Minutes Intravenous Every 6 hours 05/31/18 1244 05/31/18 2318   05/31/18 0830  ceFAZolin (ANCEF) IVPB 2g/100 mL premix     2 g 200 mL/hr over 30 Minutes Intravenous On call to O.R. 05/31/18 0818 05/31/18 1057     and started on DVT prophylaxis in the form of Xarelto.   PT and OT were ordered for total joint protocol. Discharge planning consulted to help with postop disposition and equipment needs. Patient had a decent night on the evening of surgery. They started to get up OOB with therapy on POD #0. Hemovac drain was pulled without difficulty on day one. Continued to work with therapy into POD #2. Pt was seen during rounds on day two and was ready to go home pending progress with therapy. Dressing was changed and the incision was clean, dry, and intact with no drainage. Pt worked with therapy for one additional session and was meeting their goals. She was discharged to home later that day in stable condition.  Diet: Regular diet Activity: WBAT Follow-up: in 2 weeks Disposition: Home with outpatient PT at [facility] Discharged Condition: stable   Discharge Instructions    Call MD / Call 911   Complete by:  As  directed    If you experience chest pain or shortness of breath, CALL 911 and be transported to the hospital emergency room.  If you develope a fever above 101 F, pus (white drainage) or increased drainage or redness at the wound, or calf pain, call your surgeon's office.   Change dressing   Complete by:  As directed    Change dressing on Wednesday, then change the dressing daily with sterile 4 x 4 inch gauze dressing and apply TED hose.   Constipation Prevention   Complete by:  As directed    Drink plenty of fluids.  Prune juice may be helpful.  You may use a stool softener, such as Colace (over the counter) 100 mg twice a day.  Use MiraLax (over the counter) for constipation as needed.   Diet - low sodium heart healthy   Complete by:  As directed    Discharge instructions   Complete by:  As directed    Dr. Gaynelle Arabian Total Joint Specialist [facility] [address], [place], [place] [address] [phone]  TOTAL KNEE REPLACEMENT POSTOPERATIVE DIRECTIONS  Knee Rehabilitation, Guidelines Following Surgery  Results after knee surgery are often greatly improved when you follow the exercise, range of motion and muscle strengthening exercises prescribed by your doctor. Safety measures are also important to protect the knee from further injury. Any time any of these exercises cause you to have increased pain or swelling in your knee joint, decrease the amount until you are comfortable again and slowly increase them. If you have problems or questions, call your caregiver or physical therapist for advice.   HOME CARE INSTRUCTIONS  Remove items at home which could result in a fall. This includes throw rugs or furniture in walking pathways.  ICE to the affected knee every three hours for 30 minutes at a time and then as needed for  pain and swelling.  Continue to use ice on the knee for pain and swelling from surgery. You may notice swelling that will progress down to the foot and  ankle.  This is normal after surgery.  Elevate the leg when you are not up walking on it.   Continue to use the breathing machine which will help keep your temperature down.  It is common for your temperature to cycle up and down following surgery, especially at night when you are not up moving around and exerting yourself.  The breathing machine keeps your lungs expanded and your temperature down. Do not place pillow under knee, focus on keeping the knee straight while resting   DIET You may resume your previous home diet once your are discharged from the hospital.  DRESSING / WOUND CARE / SHOWERING You may shower 3 days after surgery, but keep the wounds dry during showering.  You may use an occlusive plastic wrap (Press'n Seal for example), NO SOAKING/SUBMERGING IN THE BATHTUB.  If the bandage gets wet, change with a clean dry gauze.  If the incision gets wet, pat the wound dry with a clean towel. You may start showering once you are discharged home but do not submerge the incision under water. Just pat the incision dry and apply a dry gauze dressing on daily. Change the surgical dressing daily and reapply a dry dressing each time.  ACTIVITY Walk with your walker as instructed. Use walker as long as suggested by your caregivers. Avoid periods of inactivity such as sitting longer than an hour when not asleep. This helps prevent blood clots.  You may resume a sexual relationship in one month or when given the OK by your doctor.  You may return to work once you are cleared by your doctor.  Do not drive a car for 6 weeks or until released by you surgeon.  Do not drive while taking narcotics.  WEIGHT BEARING Weight bearing as tolerated with assist device (walker, cane, etc) as directed, use it as long as suggested by your surgeon or therapist, typically at least 4-6 weeks.  POSTOPERATIVE CONSTIPATION PROTOCOL Constipation - defined medically as fewer than three stools per week and severe  constipation as less than one stool per week.  One of the most common issues patients have following surgery is constipation.  Even if you have a regular bowel pattern at home, your normal regimen is likely to be disrupted due to multiple reasons following surgery.  Combination of anesthesia, postoperative narcotics, change in appetite and fluid intake all can affect your bowels.  In order to avoid complications following surgery, here are some recommendations in order to help you during your recovery period.  Colace (docusate) - Pick up an over-the-counter form of Colace or another stool softener and take twice a day as long as you are requiring postoperative pain medications.  Take with a full glass of water daily.  If you experience loose stools or diarrhea, hold the colace until you stool forms back up.  If your symptoms do not get better within 1 week or if they get worse, check with your doctor.  Dulcolax (bisacodyl) - Pick up over-the-counter and take as directed by the product packaging as needed to assist with the movement of your bowels.  Take with a full glass of water.  Use this product as needed if not relieved by Colace only.   MiraLax (polyethylene glycol) - Pick up over-the-counter to have on hand.  MiraLax is a solution  that will increase the amount of water in your bowels to assist with bowel movements.  Take as directed and can mix with a glass of water, juice, soda, coffee, or tea.  Take if you go more than two days without a movement. Do not use MiraLax more than once per day. Call your doctor if you are still constipated or irregular after using this medication for 7 days in a row.  If you continue to have problems with postoperative constipation, please contact the office for further assistance and recommendations.  If you experience "the worst abdominal pain ever" or develop nausea or vomiting, please contact the office immediatly for further recommendations for  treatment.  ITCHING  If you experience itching with your medications, try taking only a single pain pill, or even half a pain pill at a time.  You can also use Benadryl over the counter for itching or also to help with sleep.   TED HOSE STOCKINGS Wear the elastic stockings on both legs for three weeks following surgery during the day but you may remove then at night for sleeping.  MEDICATIONS See your medication summary on the "After Visit Summary" that the nursing staff will review with you prior to discharge.  You may have some home medications which will be placed on hold until you complete the course of blood thinner medication.  It is important for you to complete the blood thinner medication as prescribed by your surgeon.  Continue your approved medications as instructed at time of discharge.  PRECAUTIONS If you experience chest pain or shortness of breath - call 911 immediately for transfer to the hospital emergency department.  If you develop a fever greater that 101 F, purulent drainage from wound, increased redness or drainage from wound, foul odor from the wound/dressing, or calf pain - CONTACT YOUR SURGEON.                                                   FOLLOW-UP APPOINTMENTS Make sure you keep all of your appointments after your operation with your surgeon and caregivers. You should call the office at the above phone number and make an appointment for approximately two weeks after the date of your surgery or on the date instructed by your surgeon outlined in the "After Visit Summary".   RANGE OF MOTION AND STRENGTHENING EXERCISES  Rehabilitation of the knee is important following a knee injury or an operation. After just a few days of immobilization, the muscles of the thigh which control the knee become weakened and shrink (atrophy). Knee exercises are designed to build up the tone and strength of the thigh muscles and to improve knee motion. Often times heat used for twenty to  thirty minutes before working out will loosen up your tissues and help with improving the range of motion but do not use heat for the first two weeks following surgery. These exercises can be done on a training (exercise) mat, on the floor, on a table or on a bed. Use what ever works the best and is most comfortable for you Knee exercises include:  Leg Lifts - While your knee is still immobilized in a splint or cast, you can do straight leg raises. Lift the leg to 60 degrees, hold for 3 sec, and slowly lower the leg. Repeat 10-20 times 2-3 times daily. Perform  this exercise against resistance later as your knee gets better.  Quad and Hamstring Sets - Tighten up the muscle on the front of the thigh (Quad) and hold for 5-10 sec. Repeat this 10-20 times hourly. Hamstring sets are done by pushing the foot backward against an object and holding for 5-10 sec. Repeat as with quad sets.  Leg Slides: Lying on your back, slowly slide your foot toward your buttocks, bending your knee up off the floor (only go as far as is comfortable). Then slowly slide your foot back down until your leg is flat on the floor again. Angel Wings: Lying on your back spread your legs to the side as far apart as you can without causing discomfort.  A rehabilitation program following serious knee injuries can speed recovery and prevent re-injury in the future due to weakened muscles. Contact your doctor or a physical therapist for more information on knee rehabilitation.   IF YOU ARE TRANSFERRED TO A SKILLED REHAB FACILITY If the patient is transferred to a skilled rehab facility following release from the hospital, a list of the current medications will be sent to the facility for the patient to continue.  When discharged from the skilled rehab facility, please have the facility set up the patient's [place] prior to being released. Also, the skilled facility will be responsible for providing the patient with their  medications at time of release from the facility to include their pain medication, the muscle relaxants, and their blood thinner medication. If the patient is still at the rehab facility at time of the two week follow up appointment, the skilled rehab facility will also need to assist the patient in arranging follow up appointment in our office and any transportation needs.  MAKE SURE YOU:  Understand these instructions.  Get help right away if you are not doing well or get worse.    Pick up stool softner and laxative for home use following surgery while on pain medications. Do not submerge incision under water. Please use good hand washing techniques while changing dressing each day. May shower starting three days after surgery. Please use a clean towel to pat the incision dry following showers. Continue to use ice for pain and swelling after surgery. Do not use any lotions or creams on the incision until instructed by your surgeon.   Do not put a pillow under the knee. Place it under the heel.   Complete by:  As directed    Driving restrictions   Complete by:  As directed    No driving for two weeks   TED hose   Complete by:  As directed    Use stockings (TED hose) for three weeks on both leg(s).  You may remove them at night for sleeping.   Weight bearing as tolerated   Complete by:  As directed      Allergies as of 06/02/2018   No Known Allergies     Medication List    STOP taking these medications   ibuprofen 200 MG tablet Commonly known as:  ADVIL,MOTRIN     TAKE these medications   furosemide 20 MG tablet Commonly known as:  LASIX TAKE 1 TABLET BY MOUTH TWICE A DAY What changed:    how to take this  when to take this   levothyroxine 50 MCG tablet Commonly known as:  SYNTHROID, LEVOTHROID TAKE 1 TABLET BY MOUTH ONCE DAILY What changed:  how to take this   loperamide 2 MG tablet  Commonly known as:  IMODIUM A-D Place 2 mg into feeding tube 4 (four) times daily  as needed for diarrhea or loose stools.   methocarbamol 500 MG tablet Commonly known as:  ROBAXIN Take 1 tablet (500 mg total) by mouth every 6 (six) hours as needed for muscle spasms.   omeprazole 20 MG tablet Commonly known as:  PRILOSEC OTC Take 20 mg per tube daily   oxyCODONE 5 MG/5ML solution Commonly known as:  ROXICODONE Take 5-10 mLs (5-10 mg total) by mouth every 6 (six) hours as needed for up to 7 days for severe pain.   rivaroxaban 10 MG Tabs tablet Commonly known as:  XARELTO Take 1 tablet (10 mg total) by mouth daily with breakfast for 20 days. Then take one 81 mg aspirin once a day for three weeks. Then discontinue aspirin.   traMADol 50 MG tablet Commonly known as:  ULTRAM Take 1-2 tablets (50-100 mg total) by mouth every 6 (six) hours as needed for moderate pain.            Discharge Care Instructions  (From admission, onward)         Start     Ordered   06/01/18 0000  Weight bearing as tolerated     06/01/18 0737   06/01/18 0000  Change dressing    Comments:  Change dressing on Wednesday, then change the dressing daily with sterile 4 x 4 inch gauze dressing and apply TED hose.   06/01/18 6578         Follow-up Information    Gaynelle Arabian, MD. Go on 06/15/2018.   Specialty:  Orthopedic Surgery Why:  You are scheduled for a post-operative appointment on 06-15-2018 at 2:15 pm.  Contact information: [address] [place] [address] [phone]        [facility]. Go on 06/04/2018.   Why:  You are scheduled for a physical therapy appointment on 06-04-2018 at 11:00 am with Krista Herbst at [facility]. Contact information: [address], [place], [place] [address] [phone]          Signed: Theresa Duty, PA-C Orthopedic Surgery 06/07/2018, 9:03 AM

## 2018-06-08 DIAGNOSIS — R339 Retention of urine, unspecified: Secondary | ICD-10-CM | POA: Diagnosis not present

## 2018-06-09 DIAGNOSIS — M25561 Pain in right knee: Secondary | ICD-10-CM | POA: Diagnosis not present

## 2018-06-09 DIAGNOSIS — R339 Retention of urine, unspecified: Secondary | ICD-10-CM | POA: Diagnosis not present

## 2018-06-10 ENCOUNTER — Encounter (HOSPITAL_COMMUNITY): Payer: Self-pay | Admitting: Diagnostic Radiology

## 2018-06-10 ENCOUNTER — Ambulatory Visit (HOSPITAL_COMMUNITY)
Admission: RE | Admit: 2018-06-10 | Discharge: 2018-06-10 | Disposition: A | Discharge status: Home / Self Care | Payer: Medicare Other | Source: Ambulatory Visit | Attending: Diagnostic Radiology | Admitting: Diagnostic Radiology

## 2018-06-10 ENCOUNTER — Other Ambulatory Visit (HOSPITAL_COMMUNITY): Payer: Self-pay | Admitting: Diagnostic Radiology

## 2018-06-10 DIAGNOSIS — K9423 Gastrostomy malfunction: Secondary | ICD-10-CM | POA: Diagnosis not present

## 2018-06-10 DIAGNOSIS — R633 Feeding difficulties, unspecified: Secondary | ICD-10-CM

## 2018-06-10 DIAGNOSIS — Z431 Encounter for attention to gastrostomy: Secondary | ICD-10-CM | POA: Insufficient documentation

## 2018-06-10 HISTORY — PX: IR REPLACE G-TUBE SIMPLE WO FLUORO: IMG2323

## 2018-06-10 NOTE — Procedures (Signed)
PROCEDURE SUMMARY:  Successful exchange of existing 20 French balloon retention gastrostomy tube for a new 50 French balloon retention gastrostomy tube. No immediate complications.  EBL = 0 mL. Patient tolerated well.    Claris Pong Louk PA-C 06/10/2018 11:50 AM

## 2018-06-11 DIAGNOSIS — M25561 Pain in right knee: Secondary | ICD-10-CM | POA: Diagnosis not present

## 2018-06-14 DIAGNOSIS — M25561 Pain in right knee: Secondary | ICD-10-CM | POA: Diagnosis not present

## 2018-06-14 DIAGNOSIS — J9504 Tracheo-esophageal fistula following tracheostomy: Secondary | ICD-10-CM | POA: Diagnosis not present

## 2018-06-14 DIAGNOSIS — J860 Pyothorax with fistula: Secondary | ICD-10-CM | POA: Diagnosis not present

## 2018-06-16 DIAGNOSIS — M25561 Pain in right knee: Secondary | ICD-10-CM | POA: Diagnosis not present

## 2018-06-18 DIAGNOSIS — M25561 Pain in right knee: Secondary | ICD-10-CM | POA: Diagnosis not present

## 2018-06-21 DIAGNOSIS — M25561 Pain in right knee: Secondary | ICD-10-CM | POA: Diagnosis not present

## 2018-06-23 DIAGNOSIS — M25561 Pain in right knee: Secondary | ICD-10-CM | POA: Diagnosis not present

## 2018-06-28 DIAGNOSIS — M25561 Pain in right knee: Secondary | ICD-10-CM | POA: Diagnosis not present

## 2018-06-30 DIAGNOSIS — M25561 Pain in right knee: Secondary | ICD-10-CM | POA: Diagnosis not present

## 2018-07-01 DIAGNOSIS — K219 Gastro-esophageal reflux disease without esophagitis: Secondary | ICD-10-CM | POA: Diagnosis not present

## 2018-07-01 DIAGNOSIS — Z8579 Personal history of other malignant neoplasms of lymphoid, hematopoietic and related tissues: Secondary | ICD-10-CM | POA: Diagnosis not present

## 2018-07-01 DIAGNOSIS — R1313 Dysphagia, pharyngeal phase: Secondary | ICD-10-CM | POA: Diagnosis not present

## 2018-07-01 DIAGNOSIS — J3801 Paralysis of vocal cords and larynx, unilateral: Secondary | ICD-10-CM | POA: Diagnosis not present

## 2018-07-01 DIAGNOSIS — J860 Pyothorax with fistula: Secondary | ICD-10-CM | POA: Diagnosis not present

## 2018-07-01 DIAGNOSIS — K222 Esophageal obstruction: Secondary | ICD-10-CM | POA: Diagnosis not present

## 2018-07-01 DIAGNOSIS — Z87891 Personal history of nicotine dependence: Secondary | ICD-10-CM | POA: Diagnosis not present

## 2018-07-06 DIAGNOSIS — Z96651 Presence of right artificial knee joint: Secondary | ICD-10-CM | POA: Diagnosis not present

## 2018-07-06 DIAGNOSIS — Z471 Aftercare following joint replacement surgery: Secondary | ICD-10-CM | POA: Diagnosis not present

## 2018-07-25 IMAGING — DX DG ABD PORTABLE 1V
1 series · 1 of 1 positions shown · non-contrast
Comparison: Abdominal radiograph dated 02/27/2017

CLINICAL DATA: 71-year-old female with G-tube placement.

EXAM:
PORTABLE ABDOMEN - 1 VIEW

[abdomen kub]
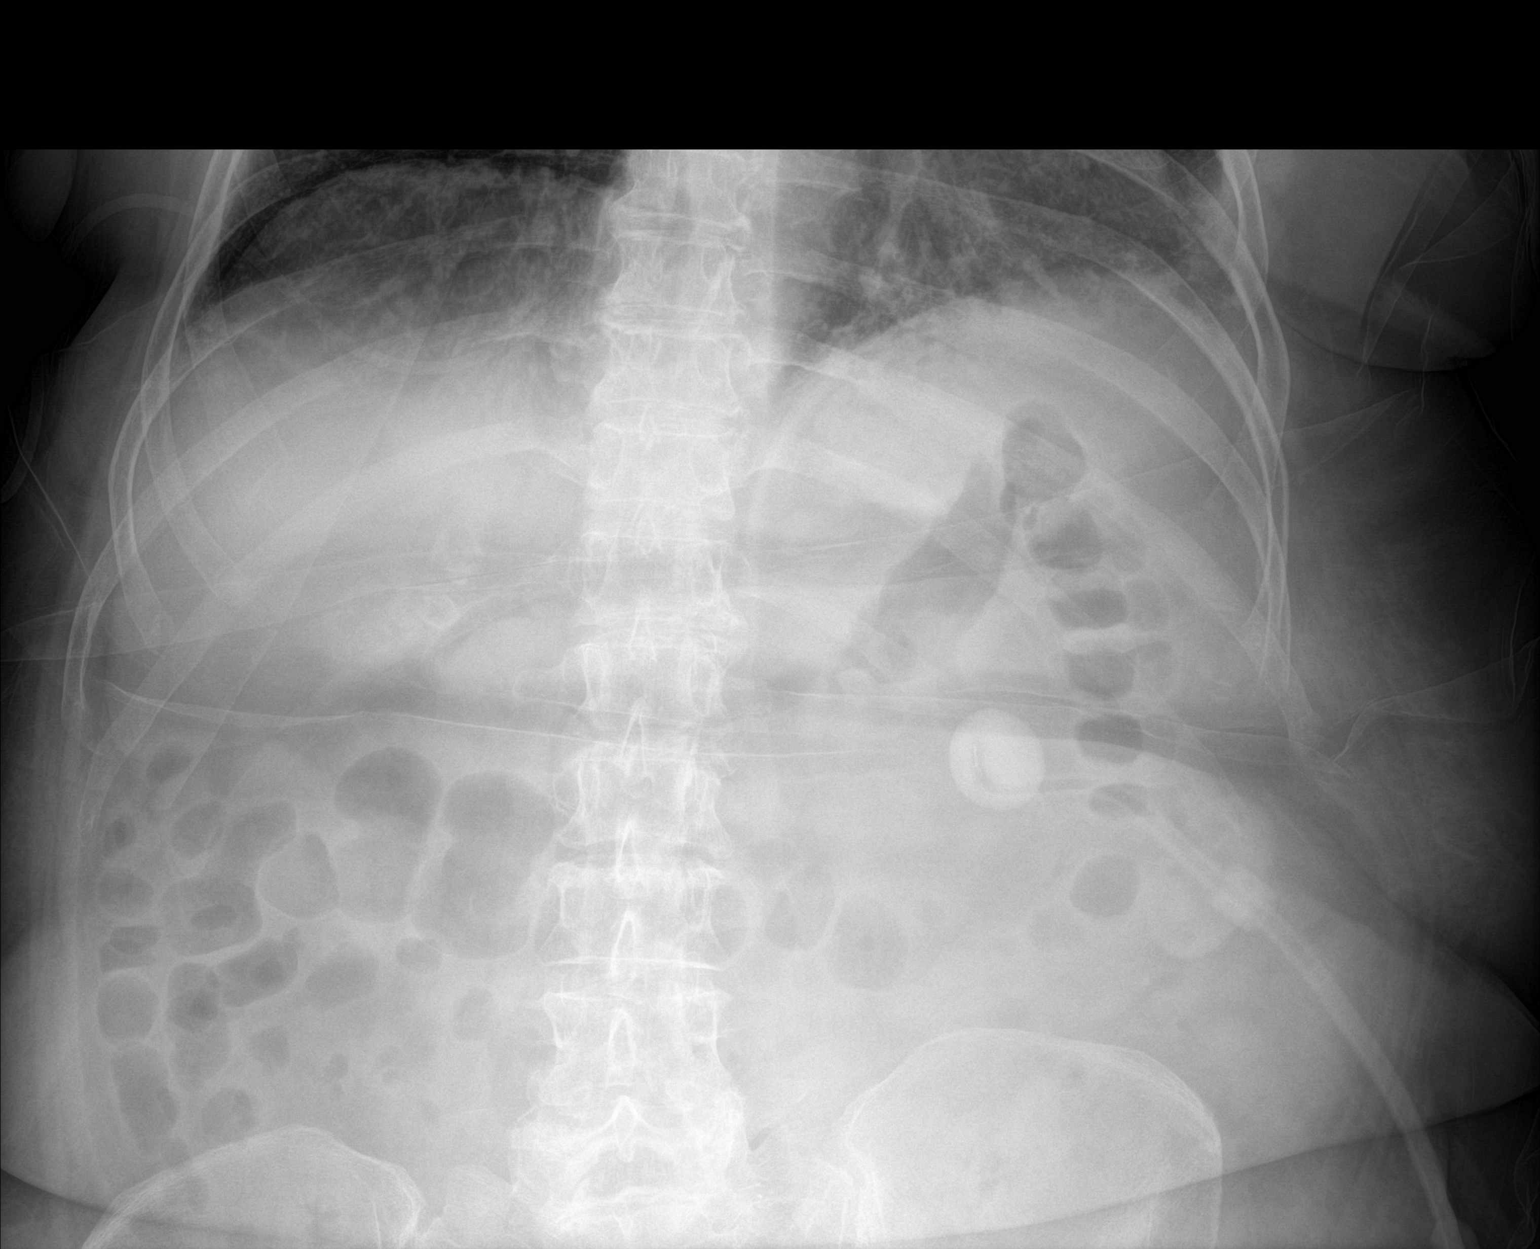

[1 of 1 positions shown; findings below may reference images not displayed]

FINDINGS: A percutaneous gastrostomy is noted with tip in the left upper
abdomen. There is no bowel dilatation or evidence of obstruction.
Multiple gallstones are noted. There is degenerative changes of the
spine. No acute osseous pathology.
IMPRESSION: 1. Percutaneous gastrostomy with tip in the left upper abdomen.
2. No evidence of bowel obstruction.
3. Cholelithiasis.

## 2018-07-26 IMAGING — XA IR REPLACE G-TUBE/COLONIC TUBE
3 series · 13 of 14 positions shown · non-contrast
Comparison: none

CLINICAL DATA: 71-year-old with history of lymphoma and
tracheoesophageal fistula. Percutaneous gastrostomy tube was placed
on 02/19/2017. The tube has already been replaced once due to
partial dislodgement. Patient is again having problems with the tube
due to excessive leakage around the tube.

[Series 4: fl - angio · 4 of 120 frames shown (1 of 2)]
[frame 4/120]
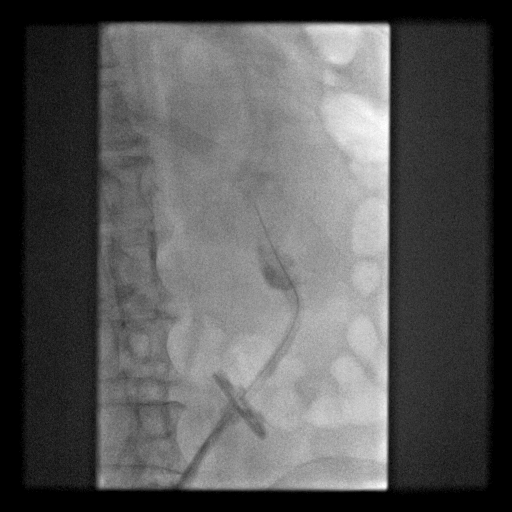
[frame 19/120]
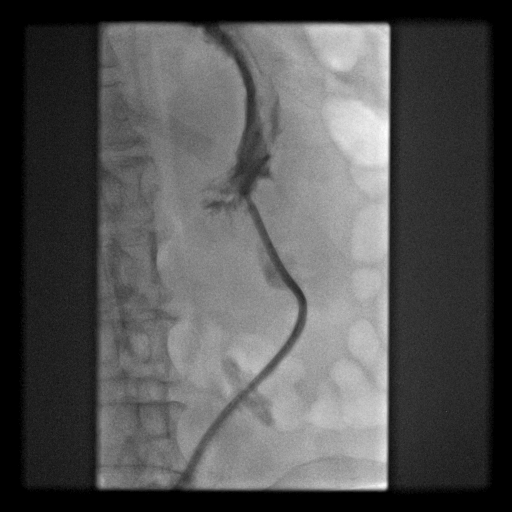
[frame 61/120]
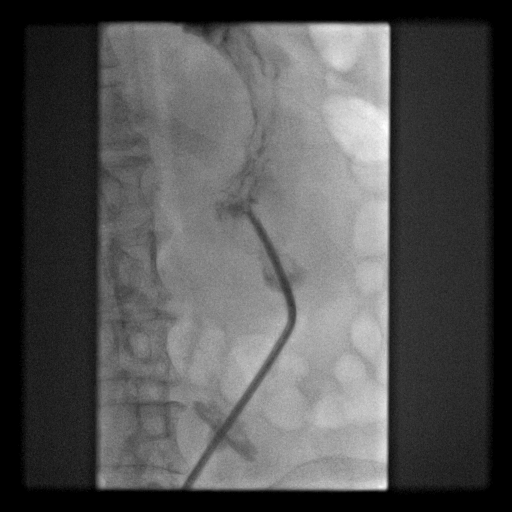
[frame 103/120]
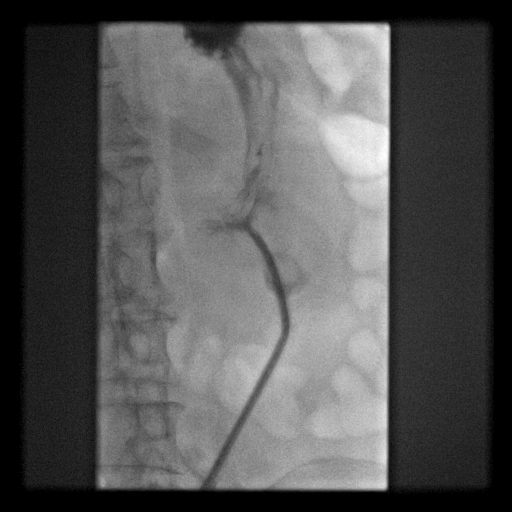

[Series 5: fl - angio · 3 of 79 frames shown (2 of 2)]
[frame 5/79]
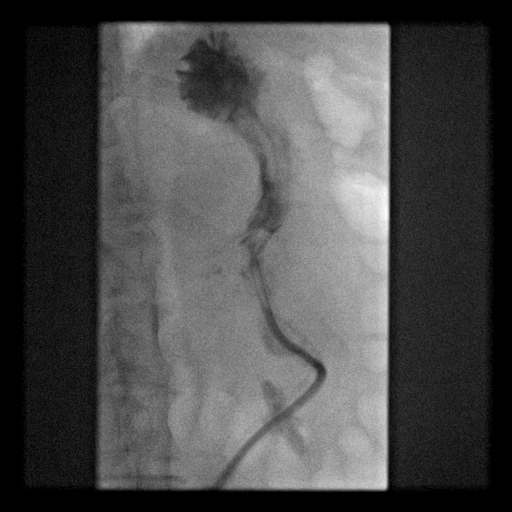
[frame 12/79]
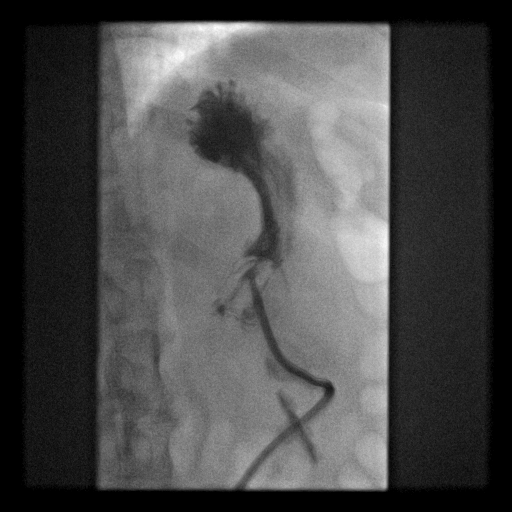
[frame 68/79]
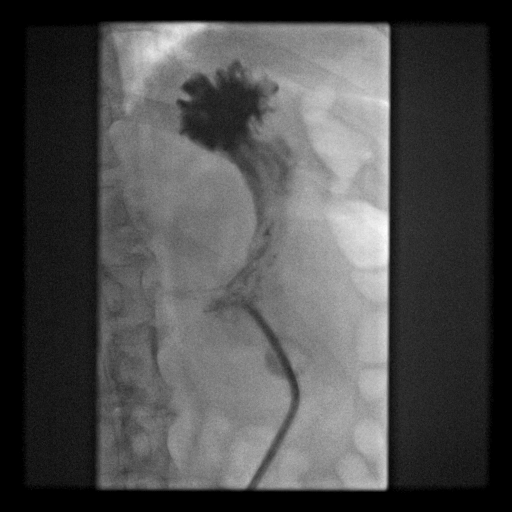

[Series 300: tube placements · 6 of 6 slices shown]
[im 1/6]
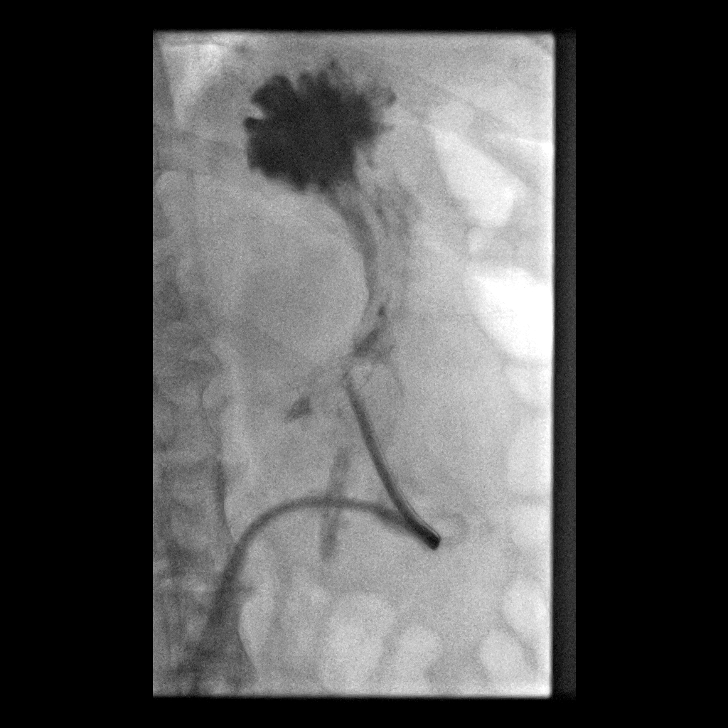
[im 2/6]
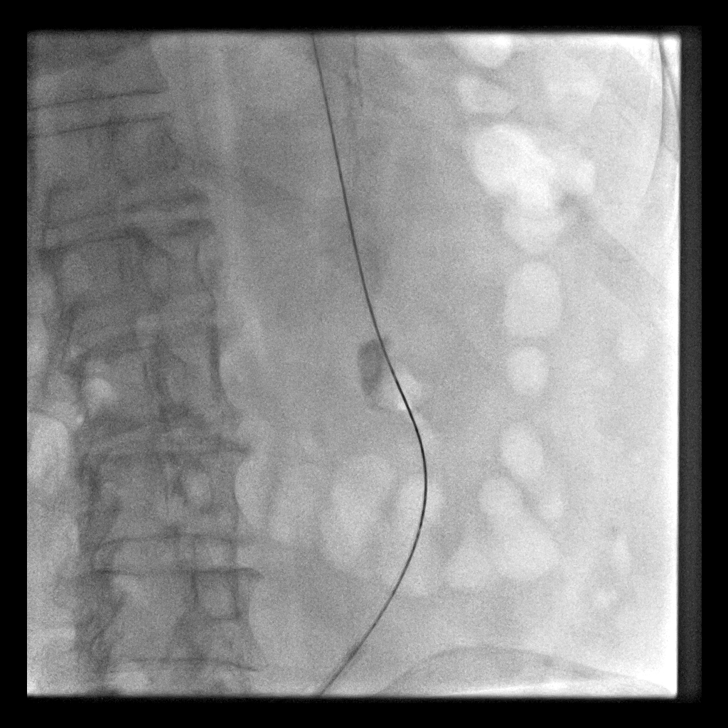
[im 3/6]
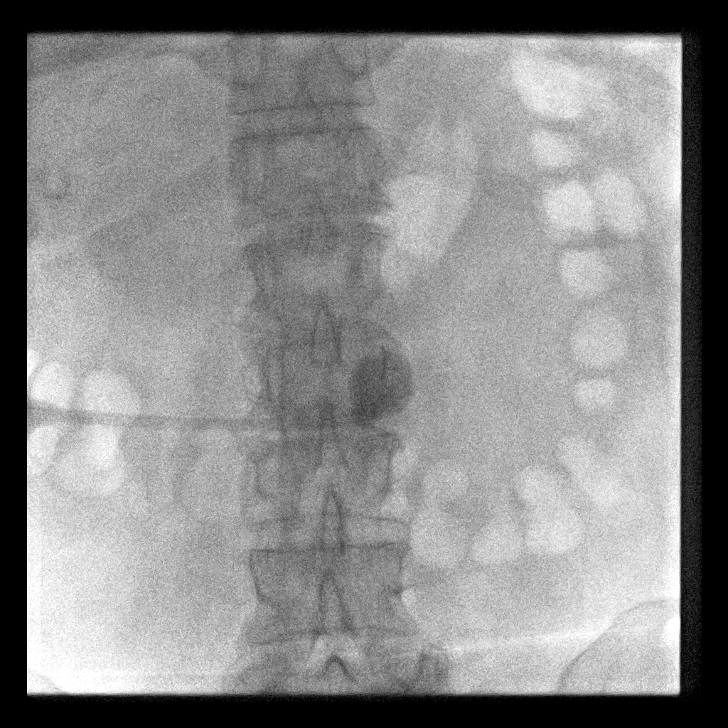
[im 4/6]
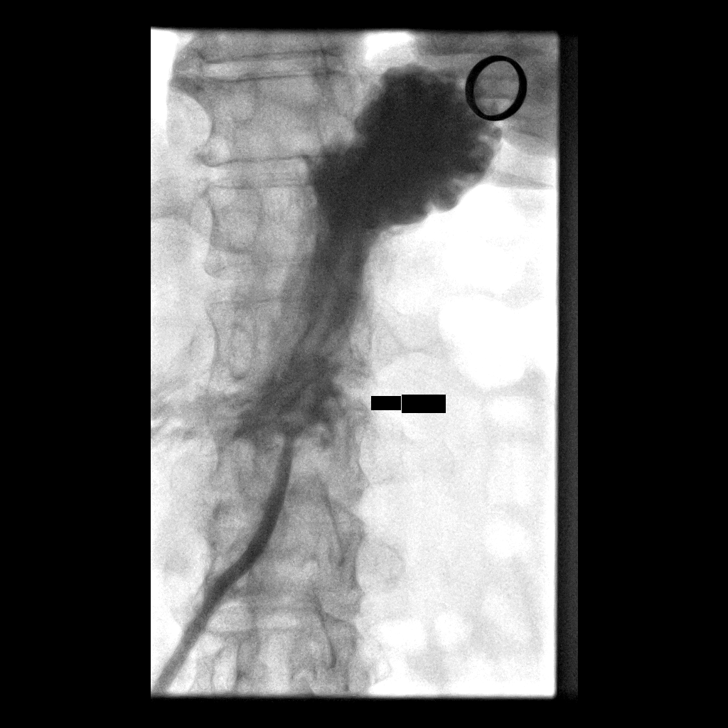
[im 5/6]
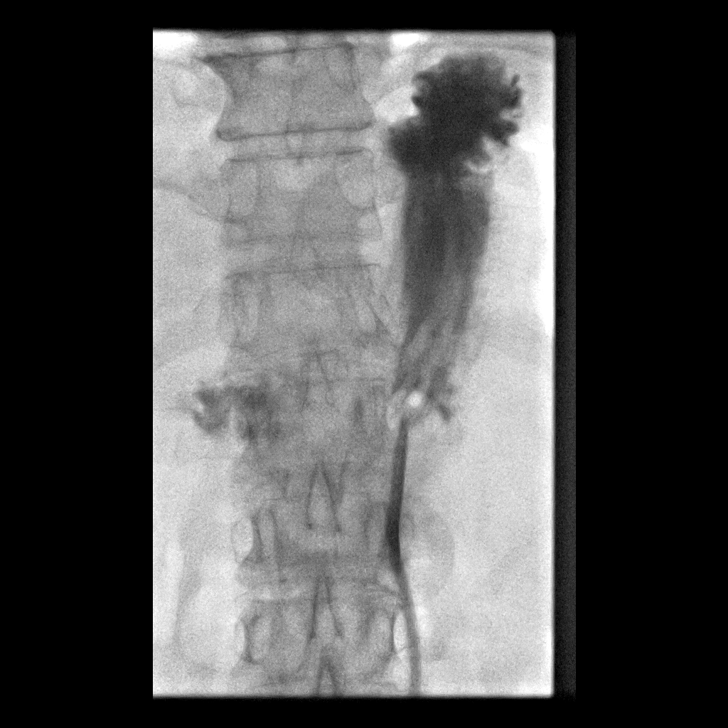
[im 6/6]
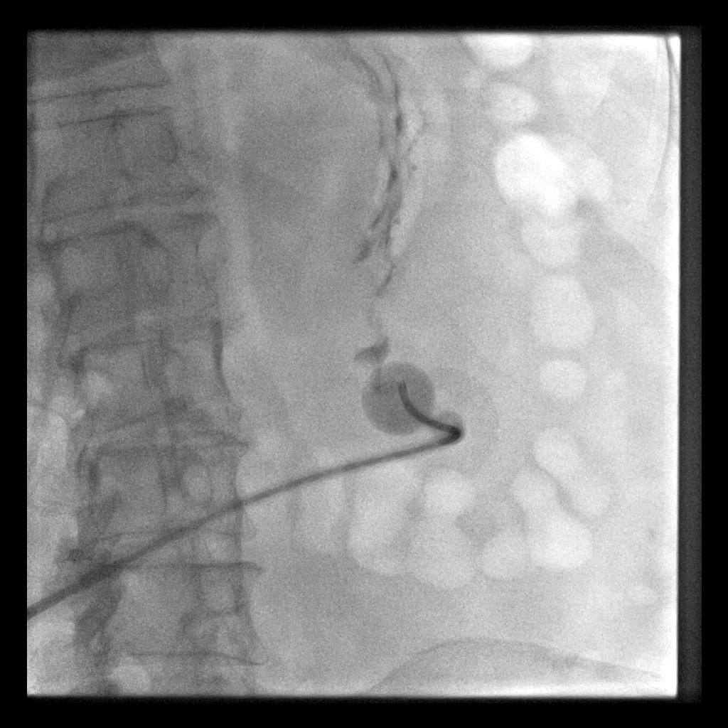

[13 of 14 positions shown; findings below may reference images not displayed]

EXAM:
GASTROSTOMY CATHETER REPLACEMENT WITH FLUOROSCOPY

FLUOROSCOPY TIME:  1 minute and 9 seconds, 58.6 mGy

MEDICATIONS:
25 mcg fentanyl

ANESTHESIA/SEDATION:
None

PROCEDURE:
The procedure was explained to the patient. The risks and benefits
of the procedure were discussed and the patient's questions were
addressed. Informed consent was obtained from the patient. The
abdomen and existing tube were prepped and draped in a sterile
fashion. Maximal barrier sterile technique was utilized including
caps, mask, sterile gowns, sterile gloves, sterile drape, hand
hygiene and skin antiseptic.

Contrast was injected through the existing tube. A stiff Amplatz
wire was advanced into the stomach and the balloon was deflated. The
tube was easily removed over the wire. A new 18 French balloon
retention tube was placed and the balloon was inflated with 10 mL of
saline. Contrast injection confirmed placement in the stomach.
However, the retention disc was loose and I was concerned that the
tube would continue leak. Therefore, the balloon was deflated and
removed over a stiff Amplatz wire. A new 20 French balloon retention
gastrostomy tube was placed and balloon was inflated with 10 mL of
saline. Contrast injection confirmed placement in the stomach. No
evidence of leakage during injection.

Fluoroscopic images were taken and saved for this procedure.
FINDINGS: The existing 16 French tube was partially dislodged within the soft
tissues. However, there was still a patent tract to the stomach. A
new 20 French gastrostomy tube is well positioned in the stomach and
the balloon was inflated with 10 mL of saline. No evidence for
leakage during follow-up injections.

COMPLICATIONS:
None
IMPRESSION: Successful exchange and up sizing of the percutaneous gastrostomy
tube. The patient now has a 20 French balloon retention gastrostomy
tube. Tube is ready to be used.

## 2018-08-10 ENCOUNTER — Other Ambulatory Visit: Payer: Self-pay | Admitting: *Deleted

## 2018-08-10 NOTE — Patient Outreach (Signed)
Outreach call to pt for screening related to insurance referral, no answer to telephone and message states "user busy".  RN CM mailed unsuccessful outreach letter to pt home.  PLAN Outreach pt in 3-4 business days  Jacqlyn Larsen [name], [place] [phone]

## 2018-08-16 ENCOUNTER — Other Ambulatory Visit: Payer: Self-pay | Admitting: *Deleted

## 2018-08-16 NOTE — Patient Outreach (Signed)
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  Jacqlyn Larsen [facility], [place] Coordinator [phone]

## 2018-09-02 ENCOUNTER — Telehealth: Payer: Self-pay | Admitting: Family Medicine

## 2018-09-02 ENCOUNTER — Other Ambulatory Visit: Payer: Self-pay

## 2018-09-02 MED ORDER — LEVOTHYROXINE SODIUM 50 MCG PO TABS: 50.0000 ug | ORAL_TABLET | Freq: Every day | ORAL | 1 refills | Status: DC

## 2018-09-02 NOTE — Telephone Encounter (Signed)
Rx has been sent to pharmacy requested.

## 2018-09-02 NOTE — Telephone Encounter (Signed)
Copied from [place] [phone]. Topic: Quick Communication - Rx Refill/Question >> Sep 02, 2018 10:45 AM Rutherford Nail, NT wrote: Medication: levothyroxine (SYNTHROID, LEVOTHROID) 50 MCG tablet   Has the patient contacted their pharmacy? yes (Agent: If no, request that the patient contact the pharmacy for the refill.) (Agent: If yes, when and what did the pharmacy advise?)  Preferred Pharmacy (with phone number or street name): [facility], [place] - [address]  Agent: Please be advised that RX refills may take up to 3 business days. We ask that you follow-up with your pharmacy.

## 2018-09-27 DIAGNOSIS — K222 Esophageal obstruction: Secondary | ICD-10-CM | POA: Diagnosis not present

## 2018-09-27 DIAGNOSIS — R05 Cough: Secondary | ICD-10-CM | POA: Diagnosis not present

## 2018-09-27 DIAGNOSIS — Z931 Gastrostomy status: Secondary | ICD-10-CM | POA: Diagnosis not present

## 2018-09-27 DIAGNOSIS — J3801 Paralysis of vocal cords and larynx, unilateral: Secondary | ICD-10-CM | POA: Diagnosis not present

## 2018-09-27 DIAGNOSIS — J860 Pyothorax with fistula: Secondary | ICD-10-CM | POA: Diagnosis not present

## 2018-09-27 DIAGNOSIS — J3800 Paralysis of vocal cords and larynx, unspecified: Secondary | ICD-10-CM | POA: Diagnosis not present

## 2018-11-08 ENCOUNTER — Other Ambulatory Visit: Payer: Self-pay | Admitting: Hematology and Oncology

## 2018-11-11 ENCOUNTER — Ambulatory Visit (HOSPITAL_COMMUNITY)
Admission: RE | Admit: 2018-11-11 | Discharge: 2018-11-11 | Disposition: A | Discharge status: Home / Self Care | Payer: Medicare Other | Source: Ambulatory Visit | Attending: Hematology and Oncology | Admitting: Hematology and Oncology

## 2018-11-11 ENCOUNTER — Other Ambulatory Visit: Payer: Self-pay

## 2018-11-11 DIAGNOSIS — Z79890 Hormone replacement therapy (postmenopausal): Secondary | ICD-10-CM | POA: Insufficient documentation

## 2018-11-11 DIAGNOSIS — C8590 Non-Hodgkin lymphoma, unspecified, unspecified site: Secondary | ICD-10-CM | POA: Diagnosis not present

## 2018-11-11 DIAGNOSIS — Z79899 Other long term (current) drug therapy: Secondary | ICD-10-CM | POA: Insufficient documentation

## 2018-11-11 DIAGNOSIS — Z7901 Long term (current) use of anticoagulants: Secondary | ICD-10-CM | POA: Diagnosis not present

## 2018-11-11 DIAGNOSIS — C50411 Malignant neoplasm of upper-outer quadrant of right female breast: Secondary | ICD-10-CM | POA: Insufficient documentation

## 2018-11-11 DIAGNOSIS — C8330 Diffuse large B-cell lymphoma, unspecified site: Secondary | ICD-10-CM | POA: Diagnosis not present

## 2018-11-11 DIAGNOSIS — K8020 Calculus of gallbladder without cholecystitis without obstruction: Secondary | ICD-10-CM | POA: Insufficient documentation

## 2018-11-11 DIAGNOSIS — Z170 Estrogen receptor positive status [ER+]: Secondary | ICD-10-CM | POA: Diagnosis not present

## 2018-11-11 LAB — GLUCOSE, CAPILLARY: Glucose-Capillary: 90 mg/dL (ref 70–99)

## 2018-11-11 MED ORDER — FLUDEOXYGLUCOSE F - 18 (FDG) INJECTION
9.6000 | Freq: Once | INTRAVENOUS | Status: AC | PRN
  Administered 2018-11-11: 9.6 via INTRAVENOUS

## 2018-11-11 NOTE — Assessment & Plan Note (Signed)
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 Completed November 2019.  PET CT scan 11/16/2017: Resolution of metabolic adenopathy in the neck. PET CT scan 11/11/2018:  Return to clinic in 6 months with labs and follow-up  Scans to be done once a year

## 2018-11-12 ENCOUNTER — Ambulatory Visit (HOSPITAL_COMMUNITY): Payer: Medicare Other

## 2018-11-15 NOTE — Progress Notes (Signed)
Patient Care Team: Eulas Post, MD as PCP - General (Family Medicine) Marcy Panning, MD as Consulting Physician (Internal Medicine) Thea Silversmith, MD as Consulting Physician (Radiation Oncology) Nicholas Lose, MD as Consulting Physician (Hematology and Oncology)  DIAGNOSIS:    ICD-10-CM   1. Malignant neoplasm of upper-outer quadrant of right breast in female, estrogen receptor positive ([place])  C50.411    Z17.0   2. 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 Radiation recall related to tamoxifen   11/25/2010 -  Anti-estrogen oral therapy   Aromasin 25 mg daily.myalgias and arthralgias stop November 2012 went back to tamoxifen 20 mg daily   01/06/2017 Pathology Results   Bone marrow biopsy: Slightly hypercellular bone marrow for age with granulocytic hyperplasia, numerous small lymphoid aggregates present. 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 with above-mentioned history of breast cancer and large B cell lymphoma. PET scan on 11/11/18 revealed no evidence of hypermetabolic lymphadenopathy in the neck, chest, abdomen, or pelvis. She presents to the clinic today for follow-up to review her recent PET scan.  Patient has started eating solid food for about a month.  Since then there has been a lot more leakage around the PEG tube.  This has resulted in wetness around the PEG tube site and skin redness.  The PET CT scan identified this area to be hypermetabolic and suggested a tissue sampling.  It is likely that she does not need this PEG tube anymore.  REVIEW OF SYSTEMS:   Constitutional: Denies fevers, chills or abnormal weight loss Eyes: Denies blurriness of vision Ears, nose, mouth, throat, and face: Denies mucositis or sore throat Respiratory: Denies cough, dyspnea or wheezes Cardiovascular: Denies palpitation, chest discomfort Gastrointestinal: PEG tube site appears to be red and inflamed Skin: Denies abnormal skin rashes Lymphatics: Denies new lymphadenopathy or easy bruising Neurological: Denies numbness, tingling or new weaknesses Behavioral/Psych: Mood is stable, no new changes  Extremities: No lower extremity edema Breast: denies any pain or lumps or nodules in either breasts All other systems were reviewed with the patient and are negative.  I have reviewed the past medical history, past surgical history,  social history and family history with the patient and they are unchanged from previous note.  ALLERGIES:  has No Known Allergies.  MEDICATIONS:  Current Outpatient  Medications  Medication Sig Dispense Refill  . amoxicillin-clavulanate (AUGMENTIN) 875-125 MG tablet Take 1 tablet by mouth 2 (two) times daily for 14 days. 28 tablet 0  . furosemide (LASIX) 20 MG tablet Take 1 tablet by mouth twice daily 360 tablet 0  . levothyroxine (SYNTHROID) 50 MCG tablet Place 1 tablet (50 mcg total) into feeding tube daily. 90 tablet 1  . loperamide (IMODIUM A-D) 2 MG tablet Place 2 mg into feeding tube 4 (four) times daily as needed for diarrhea or loose stools.    . methocarbamol (ROBAXIN) 500 MG tablet Take 1 tablet (500 mg total) by mouth every 6 (six) hours as needed for muscle spasms. 40 tablet 0  . omeprazole (PRILOSEC OTC) 20 MG tablet Take 20 mg per tube daily    . rivaroxaban (XARELTO) 10 MG TABS tablet Take 1 tablet (10 mg total) by mouth daily with breakfast for 20 days. Then take one 81 mg aspirin once a day for three weeks. Then discontinue aspirin. 20 tablet 0  . traMADol (ULTRAM) 50 MG tablet Take 1-2 tablets (50-100 mg total) by mouth every 6 (six) hours as needed for moderate pain. 40 tablet 0   No current facility-administered medications for this visit.     PHYSICAL EXAMINATION: ECOG PERFORMANCE STATUS: 1 - Symptomatic but completely ambulatory  Vitals:   11/16/18 1107  BP: (!) 134/54  Pulse: 81  Resp: 17  Temp: 97.8 F (36.6 C)  SpO2: 98%   Filed Weights   11/16/18 1107  Weight: 195 lb 6.4 oz (88.6 kg)    GENERAL: alert, no distress and comfortable SKIN: skin color, texture, turgor are normal, no rashes or significant lesions EYES: normal, Conjunctiva are pink and non-injected, sclera clear OROPHARYNX: no exudate, no erythema and lips, buccal mucosa, and tongue normal  NECK: supple, thyroid normal size, non-tender, without nodularity LYMPH: no palpable lymphadenopathy in the cervical, axillary or inguinal LUNGS: clear to auscultation and percussion with normal breathing effort HEART: regular rate & rhythm and no murmurs and no lower  extremity edema ABDOMEN: abdomen soft, non-tender and normal bowel sounds MUSCULOSKELETAL: no cyanosis of digits and no clubbing  NEURO: alert & oriented x 3 with fluent speech, no focal motor/sensory deficits EXTREMITIES: No lower extremity edema    LABORATORY DATA:  I have reviewed the data as listed CMP Latest Ref Rng & Units 06/03/2018 06/02/2018 06/01/2018  Glucose 70 - 99 mg/dL 179(H) 173(H) 131(H)  BUN 8 - 23 mg/dL 33(H) 29(H) 24(H)  Creatinine 0.44 - 1.00 mg/dL 1.05(H) 0.85 0.89  Sodium 135 - 145 mmol/L 132(L) 138 137  Potassium 3.5 - 5.1 mmol/L 4.5 4.6 4.3  Chloride 98 - 111 mmol/L 99 103 106  CO2 22 - 32 mmol/L 24 26 21(L)  Calcium 8.9 - 10.3 mg/dL 8.6(L) 8.4(L) 8.3(L)  Total Protein 6.5 - 8.1 g/dL 6.5 - -  Total Bilirubin 0.3 - 1.2 mg/dL 0.8 - -  Alkaline Phos 38 - 126 U/L 85 - -  AST 15 - 41 U/L 29 - -  ALT 0 - 44 U/L 16 - -    Lab Results  Component Value Date   WBC 13.3 (H) 06/03/2018   HGB 12.8 06/03/2018   HCT 40.3 06/03/2018   MCV 86.7 06/03/2018   PLT 213 06/03/2018   NEUTROABS 11.5 (H) 06/03/2018  ASSESSMENT & PLAN:  Diffuse large B cell lymphoma ([place]) 12/26/2016 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 Completed November 2019.  PET CT scan 11/16/2017: Resolution of metabolic adenopathy in the neck. PET CT scan 11/11/2018: No evidence of lymphoma on the scan.  Hypermetabolic activity around the PEG tube site.  Cellulitis around the PEG tube site: I gave her a prescription for Augmentin.  She will take this twice a day for 2 weeks. I asked her to make an appointment with her surgeon in 2 weeks.  If the redness does not improve then she will need a biopsy of this area. I suspect that the PEG tube will be removed very soon.  Return to clinic in 6 months with labs and follow-up  Scans to be done once a year    Orders Placed This Encounter  Procedures  . CBC with Differential (Cancer Center Only)    Standing Status:   Future    Standing Expiration Date:   11/16/2019  . CMP ([place] only)    Standing Status:   Future    Standing Expiration Date:   11/16/2019  . Lactate dehydrogenase (LDH)    Standing Status:   Future    Standing Expiration Date:   11/16/2019   The patient has a good understanding of the overall plan. she agrees with it. she will call with any problems that may develop before the next visit here.  Nicholas Lose, MD 11/16/2018  Julious Oka Dorshimer am acting as scribe for Dr. Nicholas Lose.  I have reviewed the above documentation for accuracy and completeness, and I agree with the above.

## 2018-11-16 ENCOUNTER — Inpatient Hospital Stay (HOSPITAL_BASED_OUTPATIENT_CLINIC_OR_DEPARTMENT_OTHER): Payer: Medicare Other | Admitting: Hematology and Oncology

## 2018-11-16 ENCOUNTER — Other Ambulatory Visit: Payer: Self-pay

## 2018-11-16 ENCOUNTER — Inpatient Hospital Stay: Payer: Medicare Other | Attending: Hematology and Oncology

## 2018-11-16 VITALS — BP 134/54 | HR 81 | Temp 97.8°F | Resp 17 | Ht 62.0 in | Wt 195.4 lb

## 2018-11-16 DIAGNOSIS — C50411 Malignant neoplasm of upper-outer quadrant of right female breast: Secondary | ICD-10-CM

## 2018-11-16 DIAGNOSIS — Z86711 Personal history of pulmonary embolism: Secondary | ICD-10-CM

## 2018-11-16 DIAGNOSIS — Z170 Estrogen receptor positive status [ER+]: Secondary | ICD-10-CM | POA: Diagnosis not present

## 2018-11-16 DIAGNOSIS — C8331 Diffuse large B-cell lymphoma, lymph nodes of head, face, and neck: Secondary | ICD-10-CM | POA: Diagnosis not present

## 2018-11-16 DIAGNOSIS — Z7982 Long term (current) use of aspirin: Secondary | ICD-10-CM | POA: Diagnosis not present

## 2018-11-16 DIAGNOSIS — C8330 Diffuse large B-cell lymphoma, unspecified site: Secondary | ICD-10-CM

## 2018-11-16 LAB — CBC WITH DIFFERENTIAL (CANCER CENTER ONLY)
Abs Immature Granulocytes: 0.04 10*3/uL (ref 0.00–0.07)
Basophils Absolute: 0 10*3/uL (ref 0.0–0.1)
Basophils Relative: 0 %
Eosinophils Absolute: 0.2 10*3/uL (ref 0.0–0.5)
Eosinophils Relative: 2 %
HCT: 40 % (ref 36.0–46.0)
Hemoglobin: 12.6 g/dL (ref 12.0–15.0)
Immature Granulocytes: 0 %
Lymphocytes Relative: 17 %
Lymphs Abs: 1.7 10*3/uL (ref 0.7–4.0)
MCH: 26.6 pg (ref 26.0–34.0)
MCHC: 31.5 g/dL (ref 30.0–36.0)
MCV: 84.4 fL (ref 80.0–100.0)
Monocytes Absolute: 0.9 10*3/uL (ref 0.1–1.0)
Monocytes Relative: 9 %
Neutro Abs: 6.9 10*3/uL (ref 1.7–7.7)
Neutrophils Relative %: 72 %
Platelet Count: 262 10*3/uL (ref 150–400)
RBC: 4.74 MIL/uL (ref 3.87–5.11)
RDW: 15.2 % (ref 11.5–15.5)
WBC Count: 9.8 10*3/uL (ref 4.0–10.5)
nRBC: 0 % (ref 0.0–0.2)

## 2018-11-16 LAB — CMP (CANCER CENTER ONLY)
ALT: 9 U/L (ref 0–44)
AST: 16 U/L (ref 15–41)
Albumin: 3.2 g/dL — ABNORMAL LOW (ref 3.5–5.0)
Alkaline Phosphatase: 92 U/L (ref 38–126)
Anion gap: 7 (ref 5–15)
BUN: 22 mg/dL (ref 8–23)
CO2: 26 mmol/L (ref 22–32)
Calcium: 9 mg/dL (ref 8.9–10.3)
Chloride: 106 mmol/L (ref 98–111)
Creatinine: 0.95 mg/dL (ref 0.44–1.00)
GFR, Est AFR Am: >60 mL/min (ref >60)
GFR, Estimated: 60 mL/min — ABNORMAL LOW (ref >60)
Glucose, Bld: 106 mg/dL — ABNORMAL HIGH (ref 70–99)
Potassium: 4.2 mmol/L (ref 3.5–5.1)
Sodium: 139 mmol/L (ref 135–145)
Total Bilirubin: 0.4 mg/dL (ref 0.3–1.2)
Total Protein: 7.6 g/dL (ref 6.5–8.1)

## 2018-11-16 LAB — LACTATE DEHYDROGENASE: LDH: 168 U/L (ref 98–192)

## 2018-11-16 MED ORDER — AMOXICILLIN-POT CLAVULANATE 875-125 MG PO TABS: 1.0000 | ORAL_TABLET | Freq: Two times a day (BID) | ORAL | 0 refills | Status: DC

## 2018-11-26 ENCOUNTER — Telehealth: Payer: Self-pay

## 2018-11-26 DIAGNOSIS — C50411 Malignant neoplasm of upper-outer quadrant of right female breast: Secondary | ICD-10-CM

## 2018-11-26 DIAGNOSIS — Z170 Estrogen receptor positive status [ER+]: Secondary | ICD-10-CM

## 2018-11-26 MED ORDER — AMOXICILLIN-POT CLAVULANATE 875-125 MG PO TABS: 1.0000 | ORAL_TABLET | Freq: Two times a day (BID) | ORAL | 0 refills | Status: AC

## 2018-11-26 NOTE — Telephone Encounter (Signed)
RN spoke with patient and patient's husband in regards to following up with questions regarding recent PET scan.   Pt reports site around g-tube has improved since ABT, however still redness.  Pt is concerned with results stating a biopsy may be indicated.    RN reviewed with MD.  Per MD extend Augmentin X 1 week, and we will refer patient to general surgeon for biopsy of site to rule out concerns.    RN notified patient and husband, voiced understanding and agreement.

## 2018-11-29 ENCOUNTER — Other Ambulatory Visit: Payer: Self-pay | Admitting: *Deleted

## 2018-11-29 DIAGNOSIS — Z170 Estrogen receptor positive status [ER+]: Secondary | ICD-10-CM

## 2018-11-29 DIAGNOSIS — C8330 Diffuse large B-cell lymphoma, unspecified site: Secondary | ICD-10-CM

## 2018-11-29 DIAGNOSIS — C50411 Malignant neoplasm of upper-outer quadrant of right female breast: Secondary | ICD-10-CM

## 2018-11-29 NOTE — Progress Notes (Signed)
RN spoke with Tiffany in IR today regarding scheduling pt for a biopsy of the anterior paramidline abdominal wall adjacent to g-tube r/o lymphoma.  Per Tiffany, the radiologist will review the PET scan and decide if this is a biopsy that can be preformed in IR or if the pt needs to be referred to a general surgeon.  RN also spoke with pt husband to update him on the situation.  Pt husband questioning if pt needs to have G- Tube removed.  Per Dr. Lindi Adie, that question should be answered by pt surgeon at [facility].  Pt husband verbalized understanding and will reach out to [facility].

## 2018-12-01 ENCOUNTER — Other Ambulatory Visit: Payer: Self-pay | Admitting: *Deleted

## 2018-12-01 DIAGNOSIS — J860 Pyothorax with fistula: Secondary | ICD-10-CM

## 2018-12-01 NOTE — Progress Notes (Signed)
Dr. Lindi Adie received call from pt surgeon Dr. Carol Ada at [facility] hospital.  Dr. Lindi Adie and Dr. Joya Gaskins both in agreement to have pt g-tube removed.  Pt is scheduled for IR on 12/07/2018 to have a CT biopsy and will have the g-tube removed at that time as well.  Orders placed, pt notified and verbalized understanding.

## 2018-12-02 DIAGNOSIS — Z96651 Presence of right artificial knee joint: Secondary | ICD-10-CM | POA: Diagnosis not present

## 2018-12-02 DIAGNOSIS — Z471 Aftercare following joint replacement surgery: Secondary | ICD-10-CM | POA: Diagnosis not present

## 2018-12-06 ENCOUNTER — Other Ambulatory Visit: Payer: Self-pay | Admitting: Student

## 2018-12-07 ENCOUNTER — Other Ambulatory Visit: Payer: Self-pay

## 2018-12-07 ENCOUNTER — Ambulatory Visit (HOSPITAL_COMMUNITY)
Admission: RE | Admit: 2018-12-07 | Discharge: 2018-12-07 | Disposition: A | Discharge status: Home / Self Care | Payer: Medicare Other | Source: Ambulatory Visit | Attending: Hematology and Oncology | Admitting: Hematology and Oncology

## 2018-12-07 ENCOUNTER — Encounter (HOSPITAL_COMMUNITY): Payer: Self-pay

## 2018-12-07 ENCOUNTER — Other Ambulatory Visit: Payer: Self-pay | Admitting: Hematology and Oncology

## 2018-12-07 DIAGNOSIS — Z8572 Personal history of non-Hodgkin lymphomas: Secondary | ICD-10-CM | POA: Diagnosis not present

## 2018-12-07 DIAGNOSIS — Z431 Encounter for attention to gastrostomy: Secondary | ICD-10-CM | POA: Diagnosis not present

## 2018-12-07 DIAGNOSIS — Z79899 Other long term (current) drug therapy: Secondary | ICD-10-CM | POA: Diagnosis not present

## 2018-12-07 DIAGNOSIS — I739 Peripheral vascular disease, unspecified: Secondary | ICD-10-CM | POA: Insufficient documentation

## 2018-12-07 DIAGNOSIS — E039 Hypothyroidism, unspecified: Secondary | ICD-10-CM | POA: Diagnosis not present

## 2018-12-07 DIAGNOSIS — C50411 Malignant neoplasm of upper-outer quadrant of right female breast: Secondary | ICD-10-CM

## 2018-12-07 DIAGNOSIS — K219 Gastro-esophageal reflux disease without esophagitis: Secondary | ICD-10-CM | POA: Diagnosis not present

## 2018-12-07 DIAGNOSIS — Z853 Personal history of malignant neoplasm of breast: Secondary | ICD-10-CM | POA: Insufficient documentation

## 2018-12-07 DIAGNOSIS — I10 Essential (primary) hypertension: Secondary | ICD-10-CM | POA: Diagnosis not present

## 2018-12-07 DIAGNOSIS — E669 Obesity, unspecified: Secondary | ICD-10-CM | POA: Diagnosis not present

## 2018-12-07 DIAGNOSIS — C8330 Diffuse large B-cell lymphoma, unspecified site: Secondary | ICD-10-CM

## 2018-12-07 DIAGNOSIS — Z79890 Hormone replacement therapy (postmenopausal): Secondary | ICD-10-CM | POA: Insufficient documentation

## 2018-12-07 DIAGNOSIS — Z923 Personal history of irradiation: Secondary | ICD-10-CM | POA: Insufficient documentation

## 2018-12-07 DIAGNOSIS — R222 Localized swelling, mass and lump, trunk: Secondary | ICD-10-CM | POA: Diagnosis not present

## 2018-12-07 DIAGNOSIS — Z170 Estrogen receptor positive status [ER+]: Secondary | ICD-10-CM

## 2018-12-07 DIAGNOSIS — M1990 Unspecified osteoarthritis, unspecified site: Secondary | ICD-10-CM | POA: Insufficient documentation

## 2018-12-07 DIAGNOSIS — K654 Sclerosing mesenteritis: Secondary | ICD-10-CM | POA: Diagnosis not present

## 2018-12-07 DIAGNOSIS — J860 Pyothorax with fistula: Secondary | ICD-10-CM

## 2018-12-07 HISTORY — PX: IR GASTROSTOMY TUBE REMOVAL: IMG5492

## 2018-12-07 HISTORY — PX: IR US GUIDE BX ASP/DRAIN: IMG2392

## 2018-12-07 LAB — CBC WITH DIFFERENTIAL/PLATELET
Abs Immature Granulocytes: 0.03 10*3/uL (ref 0.00–0.07)
Basophils Absolute: 0.1 10*3/uL (ref 0.0–0.1)
Basophils Relative: 1 %
Eosinophils Absolute: 0.3 10*3/uL (ref 0.0–0.5)
Eosinophils Relative: 3 %
HCT: 41.7 % (ref 36.0–46.0)
Hemoglobin: 12.8 g/dL (ref 12.0–15.0)
Immature Granulocytes: 0 %
Lymphocytes Relative: 17 %
Lymphs Abs: 1.7 10*3/uL (ref 0.7–4.0)
MCH: 26.3 pg (ref 26.0–34.0)
MCHC: 30.7 g/dL (ref 30.0–36.0)
MCV: 85.8 fL (ref 80.0–100.0)
Monocytes Absolute: 1 10*3/uL (ref 0.1–1.0)
Monocytes Relative: 10 %
Neutro Abs: 6.9 10*3/uL (ref 1.7–7.7)
Neutrophils Relative %: 69 %
Platelets: 303 10*3/uL (ref 150–400)
RBC: 4.86 MIL/uL (ref 3.87–5.11)
RDW: 14.7 % (ref 11.5–15.5)
WBC: 10 10*3/uL (ref 4.0–10.5)
nRBC: 0 % (ref 0.0–0.2)

## 2018-12-07 LAB — PROTIME-INR
INR: 1 (ref 0.8–1.2)
Prothrombin Time: 13.5 seconds (ref 11.4–15.2)

## 2018-12-07 LAB — APTT: aPTT: 27 seconds (ref 24–36)

## 2018-12-07 MED ORDER — MIDAZOLAM HCL 2 MG/2ML IJ SOLN
INTRAMUSCULAR | Status: AC | PRN
  Administered 2018-12-07 (×2): 1 mg via INTRAVENOUS

## 2018-12-07 MED ORDER — SILVER NITRATE-POT NITRATE 75-25 % EX MISC
CUTANEOUS | Status: AC
  Filled 2018-12-07: qty 1

## 2018-12-07 MED ORDER — FENTANYL CITRATE (PF) 100 MCG/2ML IJ SOLN
INTRAMUSCULAR | Status: AC
  Filled 2018-12-07: qty 2

## 2018-12-07 MED ORDER — FENTANYL CITRATE (PF) 100 MCG/2ML IJ SOLN
INTRAMUSCULAR | Status: AC | PRN
  Administered 2018-12-07 (×2): 50 ug via INTRAVENOUS

## 2018-12-07 MED ORDER — MIDAZOLAM HCL 2 MG/2ML IJ SOLN
INTRAMUSCULAR | Status: AC
  Filled 2018-12-07: qty 4

## 2018-12-07 MED ORDER — LIDOCAINE HCL (PF) 1 % IJ SOLN
INTRAMUSCULAR | Status: AC | PRN
  Administered 2018-12-07: 5 mL via SUBCUTANEOUS

## 2018-12-07 MED ORDER — LIDOCAINE HCL 1 % IJ SOLN
INTRAMUSCULAR | Status: AC
  Filled 2018-12-07: qty 20

## 2018-12-07 MED ORDER — SODIUM CHLORIDE 0.9 % IV SOLN
INTRAVENOUS | Status: DC
  Administered 2018-12-07: 08:00:00 via INTRAVENOUS

## 2018-12-07 NOTE — Discharge Instructions (Signed)
PEG Tube Removal, Care After This sheet gives you information about how to care for yourself after your procedure. Your health care provider may also give you more specific instructions. If you have problems or questions, contact your health care provider. What can I expect after the procedure? After the procedure, it is common to have:  Mild discomfort at the opening in your skin where the tube was removed (tube removal site).  A small amount of drainage from the opening. Follow these instructions at home: Care of the tube removal site  Follow instructions from your health care provider about how to take care of your tube removal site. Make sure you: ? Wash your hands with soap and water before you change your bandage (dressing). If soap and water are not available, use hand sanitizer. ? Change your dressing as told by your health care provider.  Check your tube removal site every day for signs of infection. Check for: ? Redness, swelling, or pain. ? Fluid or blood. ? Warmth. ? Pus or a bad smell.  Do not take baths, swim, or use a hot tub until your health care provider approves. Ask your health care provider if you may take showers. You may only be allowed to take sponge baths. Activity   Return to your normal activities as told by your health care provider. Ask your health care provider what activities are safe for you.  Do not lift anything that is heavier than 10 lb (4.5 kg), or the limit that you are told, until your health care provider says that it is safe. General instructions  Take over-the-counter and prescription medicines only as told by your health care provider.  Follow instructions from your health care provider about eating and drinking.  Keep all follow-up visits as told by your health care provider. This is important. Contact a health care provider if:  You have a fever.  You have pain in your abdomen.  You have redness, swelling, or pain around your tube  removal site.  You have fluid or blood coming from your tube removal site.  Your tube removal site feels warm to the touch.  You have pus or a bad smell coming from your tube removal site.  You have nausea or vomiting. Get help right away if:  You have persistent bleeding from your tube removal site.  You have severe pain in your abdomen.  You are not able to eat or drink anything by mouth. Summary  Follow instructions from your health care provider about how to take care of your tube removal site.  Do not take baths, swim, or use a hot tub until your health care provider approves. Ask your health care provider if you may take showers. You may only be allowed to take sponge baths.  Check your tube removal site every day for signs of infection.  Return to your normal activities as told by your health care provider. This information is not intended to replace advice given to you by your health care provider. Make sure you discuss any questions you have with your health care provider. Document Released: 12/08/2016 Document Revised: 03/20/2017 Document Reviewed: 12/08/2016 Elsevier Patient Education  [place].    Moderate Conscious Sedation, Adult, Care After These instructions provide you with information about caring for yourself after your procedure. Your health care provider may also give you more specific instructions. Your treatment has been planned according to current medical practices, but problems sometimes occur. Call your health care provider  if you have any problems or questions after your procedure. What can I expect after the procedure? After your procedure, it is common:  To feel sleepy for several hours.  To feel clumsy and have poor balance for several hours.  To have poor judgment for several hours.  To vomit if you eat too soon. Follow these instructions at home: For at least 24 hours after the procedure:   Do not: ? Participate in activities  where you could fall or become injured. ? Drive. ? Use heavy machinery. ? Drink alcohol. ? Take sleeping pills or medicines that cause drowsiness. ? Make important decisions or sign legal documents. ? Take care of children on your own.  Rest. Eating and drinking  Follow the diet recommended by your health care provider.  If you vomit: ? Drink water, juice, or soup when you can drink without vomiting. ? Make sure you have little or no nausea before eating solid foods. General instructions  Have a responsible adult stay with you until you are awake and alert.  Take over-the-counter and prescription medicines only as told by your health care provider.  If you smoke, do not smoke without supervision.  Keep all follow-up visits as told by your health care provider. This is important. Contact a health care provider if:  You keep feeling nauseous or you keep vomiting.  You feel light-headed.  You develop a rash.  You have a fever. Get help right away if:  You have trouble breathing. This information is not intended to replace advice given to you by your health care provider. Make sure you discuss any questions you have with your health care provider. Document Released: 01/26/2013 Document Revised: 03/20/2017 Document Reviewed: 07/28/2015 Elsevier Patient Education  2020 [employer].

## 2018-12-07 NOTE — H&P (Signed)
Referring Physician(s): SKAJGO,TLXBW  Supervising Physician: Daryll Brod  Patient Status:  [facility]  Chief Complaint: "I'm having a biopsy and getting my stomach tube out"   Subjective: Patient familiar to IR service from left cervical lymph node biopsy in 2018, left thyroid nodule biopsy in 2018, Port-A-Cath placement in 2018 with removal in 2019 and G-tube placement in 2018 with multiple exchanges since that time secondary to leakage at insertion site.  She has a history of diffuse large B-cell lymphoma diagnosed in 2019, remote right breast cancer diagnosed in 2011 as well as tracheoesophageal fistula.  Recent follow-up PET scan revealed multiple hypermetabolic soft tissue attenuating nodules in the subcutaneous fat of the anterior abdominal wall with area in anterior paramidline abdominal wall adjacent to the gastrostomy tube measuring nearly 3 cm and markedly hypermetabolic.  Lymphoma cannot be excluded.  She is currently on a regular diet.  She presents today for image guided biopsy of the abdominal wall mass as well as gastrostomy tube removal.  She currently denies fever, headache, chest pain, dyspnea, cough, worsening abdominal/back pain, nausea, vomiting or bleeding.  Past Medical History:  Diagnosis Date  . Acute respiratory failure with hypoxia ([place]) 02/16/2017  . Arthritis   . Cancer ([place]) 03/2009   breast- rt  . Diffuse large B cell lymphoma ([place]) dx'd 02/17/17  . GERD (gastroesophageal reflux disease)   . History of radiation therapy 07/12/10,completed   right breast 60 Gy x30 fx  . Hypertension   . Hypothyroidism   . Obesity   . Peripheral vascular disease ([place]) 1995   PT DEVELOPED CIRCULATION PROBLEMS IN BOTH HANDS AND GANGRENE OF BOTH INDEX FINGERS--REQUIRING AMPUTATION OF THE INDEX FINGERS AND VASCULAR SURGERY.  PT TOLD HER PROBLEMS RELATED TO SMOKING.   NO OTHER PROBLEMS SINCE.  Marland Kitchen Personal history of radiation therapy 2012   Right Breast Cancer  . Pneumonia   .  Thyroid mass    Past Surgical History:  Procedure Laterality Date  . AMPUTATION     partial amputation of both index fingers  . BREAST LUMPECTOMY Right 04/01/2010  . BREAST SURGERY  2011   lumpectomy with node sampling- RIGHT  . COLONOSCOPY    . ESOPHAGOGASTRODUODENOSCOPY    . EXCISION MASS NECK Left 12/26/2016   Procedure: EXCISION MASS NECK;  Surgeon: Izora Gala, MD;  Location: [facility];  Service: ENT;  Laterality: Left;  open excision of thyroid mass left side with frozen section  . HAND SURGERY Bilateral 1995   Amputaed pointer fingers bilaterally  . IR FLUORO GUIDE PORT INSERTION RIGHT  01/14/2017  . IR GASTROSTOMY TUBE MOD SED  02/19/2017  . IR REMOVAL TUN ACCESS W/ PORT W/O FL MOD SED  11/30/2017  . IR REPLACE G-TUBE SIMPLE WO FLUORO  08/03/2017  . IR REPLACE G-TUBE SIMPLE WO FLUORO  06/10/2018  . IR REPLC GASTRO/COLONIC TUBE PERCUT W/FLUORO  02/25/2017  . IR [place] TUBE PERCUT W/FLUORO  03/09/2017  . IR US GUIDE VASC ACCESS RIGHT  01/14/2017  . JOINT REPLACEMENT    . TOTAL HIP ARTHROPLASTY  12/16/2011   right hip  . TOTAL HIP ARTHROPLASTY  01/20/2012   Procedure: TOTAL HIP ARTHROPLASTY ANTERIOR APPROACH;  Surgeon: Mauri Pole, MD;  [facility];  Service: Orthopedics;  Laterality: Left;  . TOTAL KNEE ARTHROPLASTY Right 05/31/2018   Procedure: TOTAL KNEE ARTHROPLASTY;  Surgeon: Gaynelle Arabian, MD;  [facility];  Service: Orthopedics;  Laterality: Right;  Adductor Block  . TUBAL LIGATION    .  VASCULAR SURGERY     both hands      Allergies: Patient has no known allergies.  Medications: Prior to Admission medications   Medication Sig Start Date End Date Taking? Authorizing Provider  furosemide (LASIX) 20 MG tablet Take 1 tablet by mouth twice daily 11/08/18   Nicholas Lose, MD  levothyroxine (SYNTHROID) 50 MCG tablet Place 1 tablet (50 mcg total) into feeding tube daily. 09/02/18   Burchette, Alinda Sierras, MD  loperamide (IMODIUM A-D) 2 MG tablet Place 2 mg into  feeding tube 4 (four) times daily as needed for diarrhea or loose stools.    [provider]  methocarbamol (ROBAXIN) 500 MG tablet Take 1 tablet (500 mg total) by mouth every 6 (six) hours as needed for muscle spasms. 06/01/18   Edmisten, Ok Anis, PA  omeprazole (PRILOSEC OTC) 20 MG tablet Take 20 mg per tube daily    [provider]  rivaroxaban (XARELTO) 10 MG TABS tablet Take 1 tablet (10 mg total) by mouth daily with breakfast for 20 days. Then take one 81 mg aspirin once a day for three weeks. Then discontinue aspirin. 06/01/18 06/21/18  Edmisten, Ok Anis, PA  traMADol (ULTRAM) 50 MG tablet Take 1-2 tablets (50-100 mg total) by mouth every 6 (six) hours as needed for moderate pain. 06/01/18   Edmisten, Kristie L, PA     Vital Signs: Blood pressure 170/77, heart rate 72, temp 98.8, respirations 18, O2 sat 98% room air   Physical Exam awake, alert.  Chest clear to auscultation bilaterally.  Heart with regular rate and rhythm, ?soft murmur; abdomen soft, obese, positive bowel sounds, gastrostomy tube intact with erythema and nodularity noted at insertion site, sl tender to palpation;  no significant lower extremity edema  Imaging: No results found.  Labs:  CBC: Recent Labs    06/02/18 0535 06/03/18 1651 11/16/18 1126 12/07/18 0733  WBC 15.6* 13.3* 9.8 10.0  HGB 10.7* 12.8 12.6 12.8  HCT 35.1* 40.3 40.0 41.7  PLT 176 213 262 303    COAGS: Recent Labs    05/26/18 1130 12/07/18 0733  INR 1.02 1.0  APTT 27 27    BMP: Recent Labs    06/01/18 0423 06/02/18 0535 06/03/18 1723 11/16/18 1126  NA 137 138 132* 139  K 4.3 4.6 4.5 4.2  CL 106 103 99 106  CO2 21* 26 24 26   GLUCOSE 131* 173* 179* 106*  BUN 24* 29* 33* 22  CALCIUM 8.3* 8.4* 8.6* 9.0  CREATININE 0.89 0.85 1.05* 0.95  GFRNONAA >60 >60 53* 60*  GFRAA >60 >60 >60 >60    LIVER FUNCTION TESTS: Recent Labs    05/18/18 0947 05/26/18 1130 06/03/18 1723 11/16/18 1126  BILITOT 0.5 0.7 0.8  0.4  AST 19 25 29 16   ALT 14 16 16 9   ALKPHOS 109 102 85 92  PROT 7.1 7.8 6.5 7.6  ALBUMIN 3.4* 4.0 3.3* 3.2*    Assessment and Plan: Pt with history of diffuse large B-cell lymphoma diagnosed in 2019, remote right breast cancer diagnosed in 2011 as well as tracheoesophageal fistula.  Recent follow-up PET scan revealed multiple hypermetabolic soft tissue attenuating nodules in the subcutaneous fat of the anterior abdominal wall with area in anterior paramidline abdominal wall adjacent to the gastrostomy tube measuring nearly 3 cm and markedly hypermetabolic.  Lymphoma cannot be excluded.  She is currently on a regular diet.  She presents today for image guided biopsy of the abdominal wall mass as well as gastrostomy tube  removal.  Details/risks of above procedures, including but not limited to, internal bleeding, infection, injury to adjacent structures discussed with patient with her understanding and consent.   Electronically Signed: D. Rowe [name], PA-C 12/07/2018, 8:39 AM   I spent a total of 25 minutes at the the patient's bedside AND on the patient's hospital floor or unit, greater than 50% of which was counseling/coordinating care for image guided biopsy of abdominal wall mass/nodule and gastrostomy tube removal

## 2018-12-07 NOTE — Procedures (Signed)
Lymphoma  S/p Gtube removal  S/p [place] bx anterior abdominal wall mass bx and cx  No comp Stable ebl min Path pending Full report in pacs

## 2018-12-09 ENCOUNTER — Telehealth: Payer: Self-pay | Admitting: Hematology and Oncology

## 2018-12-09 NOTE — Telephone Encounter (Signed)
I informed the patient that the biopsy of the PEG tube site came back as benign granulomatous inflammation.  No microorganisms were identified.

## 2018-12-12 LAB — AEROBIC/ANAEROBIC CULTURE W GRAM STAIN (SURGICAL/DEEP WOUND)
Culture: NO GROWTH
Special Requests: NORMAL

## 2018-12-22 ENCOUNTER — Other Ambulatory Visit: Payer: Self-pay | Admitting: Hematology and Oncology

## 2018-12-22 DIAGNOSIS — Z1231 Encounter for screening mammogram for malignant neoplasm of breast: Secondary | ICD-10-CM

## 2019-01-14 ENCOUNTER — Ambulatory Visit (HOSPITAL_COMMUNITY)
Admission: RE | Admit: 2019-01-14 | Discharge: 2019-01-14 | Disposition: A | Discharge status: Home / Self Care | Payer: Medicare Other | Source: Ambulatory Visit | Attending: Radiology | Admitting: Radiology

## 2019-01-14 ENCOUNTER — Other Ambulatory Visit (HOSPITAL_COMMUNITY): Payer: Self-pay | Admitting: Radiology

## 2019-01-14 ENCOUNTER — Other Ambulatory Visit: Payer: Self-pay

## 2019-01-14 ENCOUNTER — Encounter: Payer: Self-pay | Admitting: Radiology

## 2019-01-14 DIAGNOSIS — R633 Feeding difficulties, unspecified: Secondary | ICD-10-CM

## 2019-01-14 HISTORY — PX: IR PATIENT EVAL TECH 0-60 MINS: IMG5564

## 2019-01-14 MED ORDER — SILVER NITRATE-POT NITRATE 75-25 % EX MISC
CUTANEOUS | Status: AC
  Administered 2019-01-14: 4 via TOPICAL
  Filled 2019-01-14: qty 1

## 2019-01-14 MED ORDER — SILVER NITRATE-POT NITRATE 75-25 % EX MISC
CUTANEOUS | Status: AC
  Filled 2019-01-14: qty 1

## 2019-01-14 MED ORDER — SILVER NITRATE-POT NITRATE 75-25 % EX MISC
4.0000 | Freq: Once | CUTANEOUS | Status: AC
  Administered 2019-01-14: 16:00:00 4 via TOPICAL

## 2019-01-14 NOTE — Procedures (Signed)
Patient and her husband came in today with persistent leaking and granuloma tissue at the g tube removal site.  Rowe [name] PAC evaluated the site.  It did not look infected and appeared to be smaller than we last saw. There was some granuloma tissue protruding from the inserting / removal site.  I treated this with 4 sticks of Silver Nitrate.  The patient will call next week to give [place] an update if this helped.

## 2019-01-15 ENCOUNTER — Ambulatory Visit (INDEPENDENT_AMBULATORY_CARE_PROVIDER_SITE_OTHER): Payer: Medicare Other

## 2019-01-15 DIAGNOSIS — Z23 Encounter for immunization: Secondary | ICD-10-CM | POA: Diagnosis not present

## 2019-02-03 ENCOUNTER — Other Ambulatory Visit: Payer: Self-pay

## 2019-02-03 ENCOUNTER — Ambulatory Visit
Admission: RE | Admit: 2019-02-03 | Discharge: 2019-02-03 | Disposition: A | Discharge status: Home / Self Care | Payer: Medicare Other | Source: Ambulatory Visit | Attending: Hematology and Oncology | Admitting: Hematology and Oncology

## 2019-02-03 DIAGNOSIS — Z1231 Encounter for screening mammogram for malignant neoplasm of breast: Secondary | ICD-10-CM

## 2019-02-16 ENCOUNTER — Ambulatory Visit (INDEPENDENT_AMBULATORY_CARE_PROVIDER_SITE_OTHER): Payer: Medicare Other | Admitting: Family Medicine

## 2019-02-16 ENCOUNTER — Other Ambulatory Visit: Payer: Self-pay

## 2019-02-16 ENCOUNTER — Encounter: Payer: Self-pay | Admitting: Family Medicine

## 2019-02-16 VITALS — BP 118/64 | HR 62 | Temp 98.5°F | Resp 18 | Ht 62.0 in | Wt 189.9 lb

## 2019-02-16 DIAGNOSIS — L03311 Cellulitis of abdominal wall: Secondary | ICD-10-CM

## 2019-02-16 DIAGNOSIS — L02211 Cutaneous abscess of abdominal wall: Secondary | ICD-10-CM

## 2019-02-16 MED ORDER — AMOXICILLIN-POT CLAVULANATE 875-125 MG PO TABS: 1.0000 | ORAL_TABLET | Freq: Two times a day (BID) | ORAL | 0 refills | Status: DC

## 2019-02-16 NOTE — Patient Instructions (Signed)
Take the antibiotic with food  Let me know in 10-14 days if drainage not improving.    Follow up for any fever or progressive redness.

## 2019-02-16 NOTE — Progress Notes (Signed)
Subjective:     Patient ID: Kristen Escobar, female   DOB: 01-19-1946, 73 y.o.   MRN: [id number]  HPI   Kristen Escobar is seen with some drainage from her left lower abdominal wall.  She has fairly complicated history.  Has had past history of breast cancer and also history of large B-cell lymphoma.  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  Her last PET scan was July 24.  There were no signs of recurrent cancer.  She had a couple of wounds abdominal wall which were slow to heal and had biopsy through interventional radiology back in August and this showed no malignant cells.  Current area she states started as a "bump".  No history of MRSA.  She has had some mild surrounding erythema.  No fevers or chills.  She has had some cloudy brownish liquid draining from punctate center.  Past Medical History:  Diagnosis Date  . Acute respiratory failure with hypoxia ([place]) 02/16/2017  . Arthritis   . Cancer ([place]) 03/2009   breast- rt  . Diffuse large B cell lymphoma ([place]) dx'd 02/17/17  . GERD (gastroesophageal reflux disease)   . History of radiation therapy 07/12/10,completed   right breast 60 Gy x30 fx  . Hypertension   . Hypothyroidism   . Obesity   . Peripheral vascular disease ([place]) 1995   PT DEVELOPED CIRCULATION PROBLEMS IN BOTH HANDS AND GANGRENE OF BOTH INDEX FINGERS--REQUIRING AMPUTATION OF THE INDEX FINGERS AND VASCULAR SURGERY.  PT TOLD HER PROBLEMS RELATED TO SMOKING.   NO OTHER PROBLEMS SINCE.  Marland Kitchen Personal history of radiation therapy 2012   Right Breast Cancer  . Pneumonia   . Thyroid mass    Past Surgical History:  Procedure Laterality Date  . AMPUTATION     partial amputation of both index fingers  . BREAST LUMPECTOMY Right 04/01/2010  . BREAST SURGERY  2011   lumpectomy with node sampling- RIGHT  . COLONOSCOPY    . ESOPHAGOGASTRODUODENOSCOPY    . EXCISION MASS NECK Left 12/26/2016   Procedure: EXCISION MASS NECK;  Surgeon: Izora Gala, MD;  Location: [facility];  Service: ENT;  Laterality: Left;  open excision of thyroid mass left side with frozen section  . HAND SURGERY Bilateral 1995   Amputaed pointer fingers bilaterally  . IR FLUORO GUIDE PORT INSERTION RIGHT  01/14/2017  . IR GASTROSTOMY TUBE MOD SED  02/19/2017  . IR GASTROSTOMY TUBE REMOVAL  12/07/2018  . IR PATIENT EVAL TECH 0-60 MINS  01/14/2019  . IR REMOVAL TUN ACCESS W/ PORT W/O FL MOD SED  11/30/2017  . IR REPLACE G-TUBE SIMPLE WO FLUORO  08/03/2017  . IR REPLACE G-TUBE SIMPLE WO FLUORO  06/10/2018  . IR REPLC GASTRO/COLONIC TUBE PERCUT W/FLUORO  02/25/2017  . IR [place] TUBE PERCUT W/FLUORO  03/09/2017  . IR US GUIDE BX ASP/DRAIN  12/07/2018  . IR US GUIDE VASC ACCESS RIGHT  01/14/2017  . JOINT REPLACEMENT    . TOTAL HIP ARTHROPLASTY  12/16/2011   right hip  . TOTAL HIP ARTHROPLASTY  01/20/2012   Procedure: TOTAL HIP ARTHROPLASTY ANTERIOR APPROACH;  Surgeon: Mauri Pole, MD;  [facility];  Service: Orthopedics;  Laterality: Left;  . TOTAL KNEE ARTHROPLASTY Right 05/31/2018   Procedure: TOTAL KNEE ARTHROPLASTY;  Surgeon: Gaynelle Arabian, MD;  [facility];  Service: Orthopedics;  Laterality: Right;  Adductor Block  . TUBAL LIGATION    .  VASCULAR SURGERY     both hands    reports that she quit smoking about 25 years ago. She has a 20.00 pack-year smoking history. She has never used smokeless tobacco. She reports current alcohol use. She reports that she does not use drugs. family history includes Cancer in her mother and sister; Coronary artery disease in her brother, brother, and father; Diabetes in her father and sister; Hearing loss in her mother. No Known Allergies   Review of Systems  Constitutional: Negative for chills and fever.  Respiratory: Negative for cough and shortness of breath.   Gastrointestinal: Negative for abdominal pain, nausea and vomiting.  Neurological: Negative for  dizziness.       Objective:   Physical Exam Vitals signs reviewed.  Constitutional:      Appearance: Normal appearance.  Cardiovascular:     Rate and Rhythm: Normal rate and regular rhythm.  Skin:    Comments: She has a couple small slow to heal wounds from her previous feeding tube site.  Left abdomen inferior and lateral to the umbilicus region she has approximately 1 x 1 cm area of erythema.  Small hole near the center that is draining some slightly cloudy yellow to brown liquid.  There is no fluctuance.  Nontender.  No foul odor  Neurological:     Mental Status: She is alert.        Assessment:     Abdominal wall wound left lower abdomen.  She does appear to have a small amount of purulent fluid draining from this.  There is no fluctuance. Appears to be fairly superficial.   Nontender.    Plan:     -Start Augmentin 875 mg twice daily for 10 days -If drainage not fully ceased over the next 10 to 14 days touch base for follow-up. -Follow-up immediately for any fever, chills, progressive redness, or other concerns.  Consider CT abdomen and pelvis to further assess if not improving with antibiotics  Eulas Post MD [facility] at [facility]

## 2019-02-24 ENCOUNTER — Encounter: Payer: Self-pay | Admitting: Family Medicine

## 2019-02-24 ENCOUNTER — Telehealth: Payer: Self-pay | Admitting: Family Medicine

## 2019-02-24 NOTE — Telephone Encounter (Signed)
Spoke with husband of patient husband. They will send pictures of wound via MyChart. Wound is currently not oozing so maybe difficult to get a culture. Can schedule patient with for an in person OV with another provider tomorrow or see if another office has an opening today.

## 2019-02-24 NOTE — Telephone Encounter (Signed)
Pt was treated for wound on her left ab about a week agois 5 inches form where her feeding tube was, was put on antibiotic and it started to dry up but still has oozed a little but meds will be gone by tomorrow, worried to go the weekend. Dr B told them to call back if not better, wanting a cb to see if a culture needed to be done or more meds thru the weekend till can see Dr B.  Pt husband's wants a return call as soon as possible. [phone]. Worried about infection.

## 2019-02-25 NOTE — Telephone Encounter (Signed)
Pt's husband sent [employer] and this was addressed by other provider. Closing this encounter.

## 2019-02-28 ENCOUNTER — Other Ambulatory Visit: Payer: Self-pay

## 2019-02-28 ENCOUNTER — Encounter: Payer: Self-pay | Admitting: Family Medicine

## 2019-02-28 ENCOUNTER — Ambulatory Visit (INDEPENDENT_AMBULATORY_CARE_PROVIDER_SITE_OTHER): Payer: Medicare Other | Admitting: Family Medicine

## 2019-02-28 VITALS — BP 122/82 | HR 67 | Temp 98.7°F | Ht 62.0 in | Wt 190.0 lb

## 2019-02-28 DIAGNOSIS — E039 Hypothyroidism, unspecified: Secondary | ICD-10-CM | POA: Diagnosis not present

## 2019-02-28 DIAGNOSIS — S31109D Unspecified open wound of abdominal wall, unspecified quadrant without penetration into peritoneal cavity, subsequent encounter: Secondary | ICD-10-CM

## 2019-02-28 DIAGNOSIS — K219 Gastro-esophageal reflux disease without esophagitis: Secondary | ICD-10-CM | POA: Diagnosis not present

## 2019-02-28 LAB — TSH: TSH: 4.93 u[IU]/mL — ABNORMAL HIGH (ref 0.35–4.50)

## 2019-02-28 MED ORDER — LEVOTHYROXINE SODIUM 50 MCG PO TABS: 50.0000 ug | ORAL_TABLET | Freq: Every day | ORAL | 3 refills | Status: DC

## 2019-02-28 MED ORDER — OMEPRAZOLE MAGNESIUM 20 MG PO TBEC: 20.0000 mg | DELAYED_RELEASE_TABLET | Freq: Every day | ORAL | 3 refills | Status: DC

## 2019-02-28 NOTE — Progress Notes (Signed)
Subjective:     Patient ID: Kristen Escobar, female   DOB: [birth year], 73 y.o.   MRN: [id number]  HPI [name] is here for follow-up regarding abdominal wound.  Refer to previous note for details.   [name] is seen with some drainage from her left lower abdominal wall.  She has fairly complicated history.  Has had past history of breast cancer and also history of large B-cell lymphoma.  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  Her last PET scan was July 24.  There were no signs of recurrent cancer.  She had a couple of wounds abdominal wall which were slow to heal and had biopsy through interventional radiology back in August and this showed no malignant cells.  Current area she states started as a "bump".  No history of MRSA.  She has had some mild surrounding erythema.  No fevers or chills.  She has had some cloudy brownish liquid draining from punctate center.  We started her on Augmentin which she completed last Friday.  Her only concern is that she has some clear serous yellowish drainage from small punctate opening.  No foul odor.  No pain.  No fevers or chills.  No increased redness.  No warmth.  No history of MRSA  Patient has hypothyroidism and is due for follow-up labs.  She takes levothyroxine regularly.  Past Medical History:  Diagnosis Date  . Acute respiratory failure with hypoxia ([place]) 02/16/2017  . Arthritis   . Cancer ([place]) 03/2009   breast- rt  . Diffuse large B cell lymphoma ([place]) dx'd 02/17/17  . GERD (gastroesophageal reflux disease)   . History of radiation therapy 07/12/10,completed   right breast 60 Gy x30 fx  . Hypertension   . Hypothyroidism   . Obesity   . Peripheral vascular disease ([place]) 1995   PT DEVELOPED CIRCULATION PROBLEMS IN BOTH HANDS AND GANGRENE OF BOTH INDEX FINGERS--REQUIRING AMPUTATION OF THE INDEX FINGERS AND VASCULAR SURGERY.  PT TOLD HER PROBLEMS  RELATED TO SMOKING.   NO OTHER PROBLEMS SINCE.  Marland Kitchen Personal history of radiation therapy 2012   Right Breast Cancer  . Pneumonia   . Thyroid mass    Past Surgical History:  Procedure Laterality Date  . AMPUTATION     partial amputation of both index fingers  . BREAST LUMPECTOMY Right 04/01/2010  . BREAST SURGERY  2011   lumpectomy with node sampling- RIGHT  . COLONOSCOPY    . ESOPHAGOGASTRODUODENOSCOPY    . EXCISION MASS NECK Left 12/26/2016   Procedure: EXCISION MASS NECK;  Surgeon: Izora Gala, MD;  Location: [facility];  Service: ENT;  Laterality: Left;  open excision of thyroid mass left side with frozen section  . HAND SURGERY Bilateral 1995   Amputaed pointer fingers bilaterally  . IR FLUORO GUIDE PORT INSERTION RIGHT  01/14/2017  . IR GASTROSTOMY TUBE MOD SED  02/19/2017  . IR GASTROSTOMY TUBE REMOVAL  12/07/2018  . IR PATIENT EVAL TECH 0-60 MINS  01/14/2019  . IR REMOVAL TUN ACCESS W/ PORT W/O FL MOD SED  11/30/2017  . IR REPLACE G-TUBE SIMPLE WO FLUORO  08/03/2017  . IR REPLACE G-TUBE SIMPLE WO FLUORO  06/10/2018  . IR REPLC GASTRO/COLONIC TUBE PERCUT W/FLUORO  02/25/2017  . IR [place] TUBE PERCUT W/FLUORO  03/09/2017  . IR US GUIDE BX ASP/DRAIN  12/07/2018  . IR US GUIDE VASC ACCESS RIGHT  01/14/2017  . JOINT  REPLACEMENT    . TOTAL HIP ARTHROPLASTY  12/16/2011   right hip  . TOTAL HIP ARTHROPLASTY  01/20/2012   Procedure: TOTAL HIP ARTHROPLASTY ANTERIOR APPROACH;  Surgeon: Mauri Pole, MD;  Location: [facility];  Service: Orthopedics;  Laterality: Left;  . TOTAL KNEE ARTHROPLASTY Right 05/31/2018   Procedure: TOTAL KNEE ARTHROPLASTY;  Surgeon: Gaynelle Arabian, MD;  Location: [facility];  Service: Orthopedics;  Laterality: Right;  Adductor Block  . TUBAL LIGATION    . VASCULAR SURGERY     both hands    reports that she quit smoking about 25 years ago. She has a 20.00 pack-year smoking history. She has never used smokeless tobacco. She reports current alcohol use. She reports that  she does not use drugs. family history includes Cancer in her mother and sister; Coronary artery disease in her brother, brother, and father; Diabetes in her father and sister; Hearing loss in her mother. No Known Allergies  Review of Systems  Constitutional: Negative for chills, fever and unexpected weight change.  Respiratory: Negative for shortness of breath.   Cardiovascular: Negative for chest pain.  Gastrointestinal: Negative for abdominal pain, nausea and vomiting.  Endocrine: Negative for cold intolerance and heat intolerance.  Neurological: Negative for weakness.  Hematological: Negative for adenopathy.       Objective:   Physical Exam Vitals signs reviewed.  Cardiovascular:     Rate and Rhythm: Normal rate and regular rhythm.  Pulmonary:     Effort: Pulmonary effort is normal.     Breath sounds: Normal breath sounds.  Abdominal:     Comments: She has small punctate hole left mid abdomen as previously described.  No warmth.  No fluctuance.  She has a little bit of serous type yellowish thin noncloudy drainage coming from the center.  No wound necrosis.  Neurological:     Mental Status: She is alert.        Assessment:     #1 draining wound abdominal wall.  This is more of a clear serous type drainage with just slight yellow tent but no obvious purulence.  Doubt deeper abscess.  She is not have any red flags such as redness, warmth, pain, fever, etc.  #2 hypothyroidism-due for follow-up labs  #3 GERD history    Plan:     -Wound culture obtained from abdominal wound -Would not recommend any further antibiotics at this time -Check TSH -Refilled levothyroxine and omeprazole  Eulas Post MD [facility]

## 2019-03-03 LAB — WOUND CULTURE
MICRO NUMBER:: 1079126
SPECIMEN QUALITY:: ADEQUATE

## 2019-03-04 ENCOUNTER — Telehealth: Payer: Self-pay | Admitting: *Deleted

## 2019-03-04 NOTE — Telephone Encounter (Signed)
Dr. Elease Hashimoto please advise-  I spoke with pt about her TSH result. She had no additional questions. Also informed her and husband about the wound culture. They stated you were going to call them to go over that and further recommendations and if she needed an antibiotic.

## 2019-03-04 NOTE — Telephone Encounter (Signed)
Wound culture negative.  No indication for further antibiotics at this time unless she develops any foul odor, surrounding erythema, etc.

## 2019-03-04 NOTE — Telephone Encounter (Signed)
Dr. Elease Hashimoto, I spoke with pt and pt's husband and they understood about the antibiotics and they just wanted to let you know that it is still oozing. They stated they will be sure to make sure to keep it cleaned and covered.

## 2019-03-04 NOTE — Telephone Encounter (Signed)
Copied from [place] [id number]. Topic: General - Other >> Mar 04, 2019  9:00 AM Rainey Pines A wrote: Pt would like a callback to go over recent lab results and clarification on dosage for synthroid medication. Please advise.

## 2019-03-28 DIAGNOSIS — J860 Pyothorax with fistula: Secondary | ICD-10-CM | POA: Diagnosis not present

## 2019-03-28 DIAGNOSIS — K224 Dyskinesia of esophagus: Secondary | ICD-10-CM | POA: Diagnosis not present

## 2019-03-28 DIAGNOSIS — K222 Esophageal obstruction: Secondary | ICD-10-CM | POA: Diagnosis not present

## 2019-03-28 DIAGNOSIS — R1314 Dysphagia, pharyngoesophageal phase: Secondary | ICD-10-CM | POA: Diagnosis not present

## 2019-03-28 DIAGNOSIS — J3800 Paralysis of vocal cords and larynx, unspecified: Secondary | ICD-10-CM | POA: Diagnosis not present

## 2019-03-28 DIAGNOSIS — K449 Diaphragmatic hernia without obstruction or gangrene: Secondary | ICD-10-CM | POA: Diagnosis not present

## 2019-05-03 DIAGNOSIS — L929 Granulomatous disorder of the skin and subcutaneous tissue, unspecified: Secondary | ICD-10-CM | POA: Diagnosis not present

## 2019-05-03 DIAGNOSIS — D485 Neoplasm of uncertain behavior of skin: Secondary | ICD-10-CM | POA: Diagnosis not present

## 2019-05-03 DIAGNOSIS — M793 Panniculitis, unspecified: Secondary | ICD-10-CM | POA: Diagnosis not present

## 2019-05-03 DIAGNOSIS — L0889 Other specified local infections of the skin and subcutaneous tissue: Secondary | ICD-10-CM | POA: Diagnosis not present

## 2019-05-08 ENCOUNTER — Ambulatory Visit: Payer: Medicare Other | Attending: Internal Medicine

## 2019-05-08 ENCOUNTER — Encounter: Payer: Self-pay | Admitting: *Deleted

## 2019-05-08 DIAGNOSIS — Z23 Encounter for immunization: Secondary | ICD-10-CM | POA: Diagnosis not present

## 2019-05-08 NOTE — Progress Notes (Unsigned)
   Covid-19 Vaccination Clinic  Name:  Kristen Escobar    MRN: [id number] DOB: 1945-12-07  05/08/2019  Kristen Escobar was observed post Covid-19 immunization for 15 minutes without incidence. She was provided with Vaccine Information Sheet and instruction to access the V-Safe system.   Kristen Escobar was instructed to call 911 with any severe reactions post vaccine: Marland Kitchen Difficulty breathing  . Swelling of your face and throat  . A fast heartbeat  . A bad rash all over your body  . Dizziness and weakness

## 2019-05-18 DIAGNOSIS — K316 Fistula of stomach and duodenum: Secondary | ICD-10-CM | POA: Diagnosis not present

## 2019-05-18 DIAGNOSIS — Z8579 Personal history of other malignant neoplasms of lymphoid, hematopoietic and related tissues: Secondary | ICD-10-CM | POA: Diagnosis not present

## 2019-05-18 DIAGNOSIS — K222 Esophageal obstruction: Secondary | ICD-10-CM | POA: Diagnosis not present

## 2019-05-18 NOTE — Progress Notes (Signed)
Patient Care Team: Eulas Post, MD as PCP - General (Family Medicine) Marcy Panning, MD as Consulting Physician (Internal Medicine) Thea Silversmith, MD as Consulting Physician (Radiation Oncology) Nicholas Lose, MD as Consulting Physician (Hematology and Oncology)  DIAGNOSIS:    ICD-10-CM   1. Malignant neoplasm of upper-outer quadrant of right breast in female, estrogen receptor positive ([place])  C50.411    Z17.0   2. 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 Radiation recall related to tamoxifen   11/25/2010 -  Anti-estrogen oral therapy   Aromasin 25 mg daily.myalgias and arthralgias stop November 2012 went back to tamoxifen 20 mg daily   01/06/2017 Pathology Results   Bone marrow biopsy: Slightly hypercellular bone marrow for age with granulocytic hyperplasia, numerous small lymphoid aggregates present. This may represent minimal involvement by low-grade B-cell lymphoma or liver process like SLL/CLL because they are CD5 positive   Diffuse large B cell lymphoma ([place])  12/26/2016 Initial Diagnosis   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 with above-mentioned history of breast cancer and large B cell lymphoma. Mammogram on 02/03/19 showed no evidence of malignancy bilaterally. She presents to the clinic today for follow-up.  She has developed a nonhealing abdominal wound at the site of the PEG tube insertion which apparently is a fistula that is connecting to the stomach.  She saw Dr. Lucia Gaskins yesterday and the plan is to close the fistula surgically.  She is also had some esophageal narrowing and is debating whether she will undergo esophageal stretching ahead of the surgical procedure.  She received her first Covid vaccine and is doing well from that.  ALLERGIES:  has No Known Allergies.  MEDICATIONS:  Current Outpatient Medications  Medication Sig Dispense Refill  . furosemide (LASIX) 20 MG tablet Take 1 tablet by mouth twice daily 360 tablet 0  . levothyroxine (SYNTHROID) 50 MCG tablet Place 1 tablet (50 mcg total) into feeding tube daily. 90 tablet 3  . loperamide (IMODIUM A-D) 2 MG tablet Place 2 mg into feeding tube 4 (four) times daily as needed for diarrhea or loose stools.    . methocarbamol (ROBAXIN) 500 MG tablet Take 1 tablet (500 mg total) by mouth every 6 (six) hours as needed for muscle spasms. (Patient not taking: Reported on 02/16/2019) 40 tablet 0  . omeprazole (PRILOSEC OTC) 20 MG tablet Take 1 tablet (20 mg total) by mouth daily. Take 20 mg per tube daily 90 tablet 3  . rivaroxaban (XARELTO)  10 MG TABS tablet Take 1 tablet (10 mg total) by mouth daily with breakfast for 20 days. Then take one 81 mg aspirin once a day for three weeks. Then discontinue aspirin. (Patient not taking: Reported on 02/28/2019) 20 tablet 0  . traMADol (ULTRAM) 50 MG tablet Take 1-2 tablets (50-100 mg  total) by mouth every 6 (six) hours as needed for moderate pain. (Patient not taking: Reported on 02/16/2019) 40 tablet 0   No current facility-administered medications for this visit.    PHYSICAL EXAMINATION: ECOG PERFORMANCE STATUS: 1 - Symptomatic but completely ambulatory  Vitals:   05/19/19 1006  BP: (!) 167/54  Pulse: 73  Resp: 17  Temp: 98 F (36.7 C)  SpO2: 98%   Filed Weights   05/19/19 1006  Weight: 186 lb 14.4 oz (84.8 kg)    LABORATORY DATA:  I have reviewed the data as listed CMP Latest Ref Rng & Units 11/16/2018 06/03/2018 06/02/2018  Glucose 70 - 99 mg/dL 106(H) 179(H) 173(H)  BUN 8 - 23 mg/dL 22 33(H) 29(H)  Creatinine 0.44 - 1.00 mg/dL 0.95 1.05(H) 0.85  Sodium 135 - 145 mmol/L 139 132(L) 138  Potassium 3.5 - 5.1 mmol/L 4.2 4.5 4.6  Chloride 98 - 111 mmol/L 106 99 103  CO2 22 - 32 mmol/L _0 Calcium 8.9 - 10.3 mg/dL 9.0 8.6(L) 8.4(L)  Total Protein 6.5 - 8.1 g/dL 7.6 6.5 -  Total Bilirubin 0.3 - 1.2 mg/dL 0.4 0.8 -  Alkaline Phos 38 - 126 U/L 92 85 -  AST 15 - 41 U/L 16 29 -  ALT 0 - 44 U/L 9 16 -    Lab Results  Component Value Date   WBC 7.0 05/19/2019   HGB 12.8 05/19/2019   HCT 40.8 05/19/2019   MCV 84.8 05/19/2019   PLT 190 05/19/2019   NEUTROABS 4.6 05/19/2019    ASSESSMENT & PLAN:  Diffuse large B cell lymphoma ([place]) 12/26/2016 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 positive for LCA, CD20, CD79a PAX-5, BCL-6 and weak cytoplasmic kappa associated with patchy weak positivity for BCL-2 and CD10, Ki-67 40%  Stage IIb(bilateral cervical, esophageal, mediastinal, supraclavicular nodes)  Bone marrow biopsy negative for diffuse large B cell lymphoma but suggestive of small low-grade B cell population possibly SLL/CLL  Revised IPI score: 1, good prognosis, redictated for years PFS 80%, predicted overall survival  79% ------------------------------------------------------------------------ Priortreatment: R CHOPX 5cycles   TE Fistula:Repairedcurrently on PEG tube feeds esophageal stricture: Dilatationand undergoing procedures to close the T-E fistula Being performed by gastroenterology 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 CompletedNovember 2019.  PET CT scan 11/16/2017: Resolution of metabolic adenopathy in the neck. PET CT scan 11/11/2018: No evidence of lymphoma on the scan.  Hypermetabolic activity around the PEG tube site.  Cellulitis around the PEG tube site: The PEG tube site is still not closed.  She is seeing Dr. Lucia Gaskins to undergo abdominal surgery to fix the fistula.  She is also scheduled to undergo CT of her abdomen and pelvis. We will plan for CT chest scans in July and follow-up after that.    No orders of the defined types were placed in this encounter.  The patient has a good understanding of the overall plan. she agrees with it. she will call with any problems that may develop before the next visit here.  Total time spent: 30 mins including face to face time and time spent for planning, charting and coordination of  care  Nicholas Lose, MD 05/19/2019  Julious Oka Dorshimer, am acting as scribe for Dr. Nicholas Lose.  I have reviewed the above documentation for accuracy and completeness, and I agree with the above.

## 2019-05-19 ENCOUNTER — Inpatient Hospital Stay: Payer: Medicare Other

## 2019-05-19 ENCOUNTER — Inpatient Hospital Stay: Payer: Medicare Other | Attending: Hematology and Oncology | Admitting: Hematology and Oncology

## 2019-05-19 ENCOUNTER — Other Ambulatory Visit: Payer: Self-pay

## 2019-05-19 VITALS — BP 167/54 | HR 73 | Temp 98.0°F | Resp 17 | Ht 62.0 in | Wt 186.9 lb

## 2019-05-19 DIAGNOSIS — Z79810 Long term (current) use of selective estrogen receptor modulators (SERMs): Secondary | ICD-10-CM | POA: Diagnosis not present

## 2019-05-19 DIAGNOSIS — Z79899 Other long term (current) drug therapy: Secondary | ICD-10-CM | POA: Diagnosis not present

## 2019-05-19 DIAGNOSIS — Z86711 Personal history of pulmonary embolism: Secondary | ICD-10-CM | POA: Diagnosis not present

## 2019-05-19 DIAGNOSIS — Z923 Personal history of irradiation: Secondary | ICD-10-CM | POA: Diagnosis not present

## 2019-05-19 DIAGNOSIS — C50411 Malignant neoplasm of upper-outer quadrant of right female breast: Secondary | ICD-10-CM

## 2019-05-19 DIAGNOSIS — Z7901 Long term (current) use of anticoagulants: Secondary | ICD-10-CM | POA: Insufficient documentation

## 2019-05-19 DIAGNOSIS — Z170 Estrogen receptor positive status [ER+]: Secondary | ICD-10-CM | POA: Diagnosis not present

## 2019-05-19 DIAGNOSIS — C8330 Diffuse large B-cell lymphoma, unspecified site: Secondary | ICD-10-CM | POA: Insufficient documentation

## 2019-05-19 LAB — CMP (CANCER CENTER ONLY)
ALT: 10 U/L (ref 0–44)
AST: 16 U/L (ref 15–41)
Albumin: 3.3 g/dL — ABNORMAL LOW (ref 3.5–5.0)
Alkaline Phosphatase: 86 U/L (ref 38–126)
Anion gap: 8 (ref 5–15)
BUN: 22 mg/dL (ref 8–23)
CO2: 27 mmol/L (ref 22–32)
Calcium: 8.7 mg/dL — ABNORMAL LOW (ref 8.9–10.3)
Chloride: 106 mmol/L (ref 98–111)
Creatinine: 1.01 mg/dL — ABNORMAL HIGH (ref 0.44–1.00)
GFR, Est AFR Am: >60 mL/min (ref >60)
GFR, Estimated: 56 mL/min — ABNORMAL LOW (ref >60)
Glucose, Bld: 80 mg/dL (ref 70–99)
Potassium: 4.4 mmol/L (ref 3.5–5.1)
Sodium: 141 mmol/L (ref 135–145)
Total Bilirubin: 0.5 mg/dL (ref 0.3–1.2)
Total Protein: 7.5 g/dL (ref 6.5–8.1)

## 2019-05-19 LAB — CBC WITH DIFFERENTIAL (CANCER CENTER ONLY)
Abs Immature Granulocytes: 0.01 10*3/uL (ref 0.00–0.07)
Basophils Absolute: 0 10*3/uL (ref 0.0–0.1)
Basophils Relative: 1 %
Eosinophils Absolute: 0.2 10*3/uL (ref 0.0–0.5)
Eosinophils Relative: 3 %
HCT: 40.8 % (ref 36.0–46.0)
Hemoglobin: 12.8 g/dL (ref 12.0–15.0)
Immature Granulocytes: 0 %
Lymphocytes Relative: 23 %
Lymphs Abs: 1.6 10*3/uL (ref 0.7–4.0)
MCH: 26.6 pg (ref 26.0–34.0)
MCHC: 31.4 g/dL (ref 30.0–36.0)
MCV: 84.8 fL (ref 80.0–100.0)
Monocytes Absolute: 0.6 10*3/uL (ref 0.1–1.0)
Monocytes Relative: 9 %
Neutro Abs: 4.6 10*3/uL (ref 1.7–7.7)
Neutrophils Relative %: 64 %
Platelet Count: 190 10*3/uL (ref 150–400)
RBC: 4.81 MIL/uL (ref 3.87–5.11)
RDW: 15.3 % (ref 11.5–15.5)
WBC Count: 7 10*3/uL (ref 4.0–10.5)
nRBC: 0 % (ref 0.0–0.2)

## 2019-05-19 NOTE — Assessment & Plan Note (Signed)
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 positive for LCA, CD20, CD79a PAX-5, BCL-6 and weak cytoplasmic kappa associated with patchy weak positivity for BCL-2 and CD10, Ki-67 40%  Stage IIb(bilateral cervical, esophageal, mediastinal, supraclavicular nodes)  Bone marrow biopsy negative for diffuse large B cell lymphoma but suggestive of small low-grade B cell population possibly SLL/CLL  Revised IPI score: 1, good prognosis, redictated for years PFS 80%, predicted overall survival 79% ------------------------------------------------------------------------ Priortreatment: R CHOPX 5cycles   TE Fistula:Repairedcurrently on PEG tube feeds esophageal stricture: Dilatationand undergoing procedures to close the T-E fistula Being performed by gastroenterology 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 CompletedNovember 2019.  PET CT scan 11/16/2017: Resolution of metabolic adenopathy in the neck. PET CT scan 11/11/2018: No evidence of lymphoma on the scan.  Hypermetabolic activity around the PEG tube site.  Cellulitis around the PEG tube site: Treated with antibiotics We will plan for CT scans in July and follow-up after that.

## 2019-05-20 ENCOUNTER — Telehealth: Payer: Self-pay | Admitting: Hematology and Oncology

## 2019-05-20 NOTE — Telephone Encounter (Signed)
I talk with patient regarding schedule  

## 2019-05-27 ENCOUNTER — Ambulatory Visit: Payer: Medicare Other | Attending: Internal Medicine

## 2019-05-27 DIAGNOSIS — Z23 Encounter for immunization: Secondary | ICD-10-CM

## 2019-05-27 NOTE — Progress Notes (Signed)
   Covid-19 Vaccination Clinic  Name:  Kristen Escobar    MRN: [id number] DOB: 06/15/1945  05/27/2019  [name] was observed post Covid-19 immunization for 15 minutes without incidence. She was provided with Vaccine Information Sheet and instruction to access the V-Safe system.   [name] was instructed to call 911 with any severe reactions post vaccine: Marland Kitchen Difficulty breathing  . Swelling of your face and throat  . A fast heartbeat  . A bad rash all over your body  . Dizziness and weakness    Immunizations Administered    Name Date Dose VIS Date Route   Pfizer COVID-19 Vaccine 05/27/2019 10:59 AM 0.3 mL 04/01/2019 Intramuscular   Manufacturer: [place]   Lot: [id number]   [place]: [id number]

## 2019-06-15 ENCOUNTER — Other Ambulatory Visit: Payer: Self-pay

## 2019-06-15 ENCOUNTER — Ambulatory Visit (INDEPENDENT_AMBULATORY_CARE_PROVIDER_SITE_OTHER): Payer: Medicare Other

## 2019-06-15 DIAGNOSIS — Z0000 Encounter for general adult medical examination without abnormal findings: Secondary | ICD-10-CM | POA: Diagnosis not present

## 2019-06-15 NOTE — Progress Notes (Signed)
This visit is being conducted via phone call due to the COVID-19 pandemic. This patient has given me verbal consent via phone to conduct this visit, patient states they are participating from their home address. Some vital signs may be absent or patient reported.   Patient identification: identified by name, DOB, and current address.  Location provider: [facility] Persons participating in the virtual visit: Denman George LPN, patient, and Dr. Carolann Littler    Subjective:   Archita Cathell is a 74 y.o. female who presents for Medicare Annual (Subsequent) preventive examination.  Review of Systems:   Cardiac Risk Factors include: advanced age (>58men, >62 women)    Objective:     Vitals: There were no vitals taken for this visit.  There is no height or weight on file to calculate BMI.  Advanced Directives 06/15/2019 12/07/2018 06/03/2018 05/31/2018 05/26/2018 11/30/2017 03/11/2017  Does Patient Have a Medical Advance Directive? Yes Yes No Yes Yes Yes Yes  Type of Advance Directive Living will;Healthcare Power of [place];Living will - [place];Living will [place];Living will Living will;Healthcare Power of [place];Living will  Does patient want to make changes to medical advance directive? No - Patient declined No - Patient declined - No - Patient declined No - Patient declined No - Patient declined No - Patient declined  Copy of [place] in Chart? No - copy requested No - copy requested - No - copy requested No - copy requested No - copy requested No - copy requested  Would patient like information on creating a medical advance directive? - - - - - - -  Pre-existing out of facility DNR order (yellow form or pink MOST form) - - - - - - -    Tobacco Social History   Tobacco Use  Smoking Status Former Smoker  . Packs/day: 1.00  . Years: 20.00  . Pack years: 20.00    . Quit date: 04/21/1993  . Years since quitting: 26.1  Smokeless Tobacco Never Used     Counseling given: Not Answered   Clinical Intake:  Pre-visit preparation completed: Yes  Pain : No/denies pain  Diabetes: No  How often do you need to have someone help you when you read instructions, pamphlets, or other written materials from your doctor or pharmacy?: 1 - Never  Interpreter Needed?: No  Information entered by :: Denman George LPN  Past Medical History:  Diagnosis Date  . Acute respiratory failure with hypoxia ([place]) 02/16/2017  . Arthritis   . Cancer ([place]) 03/2009   breast- rt  . 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 GERD (gastroesophageal reflux disease)   . History of radiation therapy 07/12/10,completed   right breast 60 Gy x30 fx  . Hypertension   . Hypothyroidism   . Obesity   . Peripheral vascular disease ([place]) 1995   PT DEVELOPED CIRCULATION PROBLEMS IN BOTH HANDS AND GANGRENE OF BOTH INDEX FINGERS--REQUIRING AMPUTATION OF THE INDEX FINGERS AND VASCULAR SURGERY.  PT TOLD HER PROBLEMS RELATED TO SMOKING.   NO OTHER PROBLEMS SINCE.  Marland Kitchen Personal history of radiation therapy 2012   Right Breast Cancer  . Pneumonia   . Thyroid mass    Past Surgical History:  Procedure Laterality Date  . AMPUTATION     partial amputation of both index fingers  . BREAST LUMPECTOMY Right 04/01/2010  . BREAST SURGERY  2011   lumpectomy with node sampling- RIGHT  .  CATARACT EXTRACTION, BILATERAL  approx 2017  . COLONOSCOPY    . ESOPHAGOGASTRODUODENOSCOPY    . EXCISION MASS NECK Left 12/26/2016   Procedure: EXCISION MASS NECK;  Surgeon: Izora Gala, MD;  Location: [facility];  Service: ENT;  Laterality: Left;  open excision of thyroid mass left side with frozen section  . HAND SURGERY Bilateral 1995   Amputaed pointer fingers bilaterally  . IR FLUORO GUIDE PORT INSERTION RIGHT  01/14/2017  . IR GASTROSTOMY TUBE MOD SED  02/19/2017  . IR GASTROSTOMY TUBE REMOVAL  12/07/2018   . IR PATIENT EVAL TECH 0-60 MINS  01/14/2019  . IR REMOVAL TUN ACCESS W/ PORT W/O FL MOD SED  11/30/2017  . IR REPLACE G-TUBE SIMPLE WO FLUORO  08/03/2017  . IR REPLACE G-TUBE SIMPLE WO FLUORO  06/10/2018  . IR REPLC GASTRO/COLONIC TUBE PERCUT W/FLUORO  02/25/2017  . IR [place] TUBE PERCUT W/FLUORO  03/09/2017  . IR US GUIDE BX ASP/DRAIN  12/07/2018  . IR US GUIDE VASC ACCESS RIGHT  01/14/2017  . JOINT REPLACEMENT    . TOTAL HIP ARTHROPLASTY  12/16/2011   right hip  . TOTAL HIP ARTHROPLASTY  01/20/2012   Procedure: TOTAL HIP ARTHROPLASTY ANTERIOR APPROACH;  Surgeon: Mauri Pole, MD;  Location: [facility];  Service: Orthopedics;  Laterality: Left;  . TOTAL KNEE ARTHROPLASTY Right 05/31/2018   Procedure: TOTAL KNEE ARTHROPLASTY;  Surgeon: Gaynelle Arabian, MD;  Location: [facility];  Service: Orthopedics;  Laterality: Right;  Adductor Block  . TUBAL LIGATION    . VASCULAR SURGERY     both hands   Family History  Problem Relation Age of Onset  . Cancer Mother        pt unaware of what kind  . Hearing loss Mother   . Coronary artery disease Father   . Diabetes Father   . Coronary artery disease Brother   . Coronary artery disease Brother   . Diabetes Sister   . Cancer Sister        breast  . Colon cancer Neg Hx   . Stomach cancer Neg Hx   . Rectal cancer Neg Hx   . Esophageal cancer Neg Hx    Social History   Socioeconomic History  . Marital status: Married    Spouse name: Not on file  . Number of children: Not on file  . Years of education: Not on file  . Highest education level: Not on file  Occupational History  . Not on file  Tobacco Use  . Smoking status: Former Smoker    Packs/day: 1.00    Years: 20.00    Pack years: 20.00    Quit date: 04/21/1993    Years since quitting: 26.1  . Smokeless tobacco: Never Used  Substance and Sexual Activity  . Alcohol use: Yes    Comment: OCCAS - MAYBE ONCE A MONTH  . Drug use: No  . Sexual activity: Yes  Other Topics Concern   . Not on file  Social History Narrative  . Not on file   Social Determinants of Health   Financial Resource Strain:   . Difficulty of Paying Living Expenses: Not on file  Food Insecurity:   . Worried About Charity fundraiser in the Last Year: Not on file  . Ran Out of Food in the Last Year: Not on file  Transportation Needs:   . Lack of Transportation (Medical): Not on file  . Lack of Transportation (Non-Medical): Not on file  Physical  Activity:   . Days of Exercise per Week: Not on file  . Minutes of Exercise per Session: Not on file  Stress:   . Feeling of Stress : Not on file  Social Connections:   . Frequency of Communication with Friends and Family: Not on file  . Frequency of Social Gatherings with Friends and Family: Not on file  . Attends Religious Services: Not on file  . Active Member of Clubs or Organizations: Not on file  . Attends Archivist Meetings: Not on file  . Marital Status: Not on file    Outpatient Encounter Medications as of 06/15/2019  Medication Sig  . furosemide (LASIX) 20 MG tablet Take 1 tablet by mouth twice daily  . levothyroxine (SYNTHROID) 50 MCG tablet Place 1 tablet (50 mcg total) into feeding tube daily.  Marland Kitchen loperamide (IMODIUM A-D) 2 MG tablet Place 2 mg into feeding tube 4 (four) times daily as needed for diarrhea or loose stools.  Marland Kitchen omeprazole (PRILOSEC OTC) 20 MG tablet Take 1 tablet (20 mg total) by mouth daily. Take 20 mg per tube daily   No facility-administered encounter medications on file as of 06/15/2019.    Activities of Daily Living In your present state of health, do you have any difficulty performing the following activities: 06/15/2019 12/07/2018  Hearing? N N  Vision? N N  Difficulty concentrating or making decisions? N N  Walking or climbing stairs? N N  Dressing or bathing? N N  Doing errands, shopping? N -  Preparing Food and eating ? N -  Using the Toilet? N -  In the past six months, have you accidently  leaked urine? N -  Do you have problems with loss of bowel control? N -  Managing your Medications? N -  Managing your Finances? N -  Housekeeping or managing your Housekeeping? N -  Some recent data might be hidden    Patient Care Team: Eulas Post, MD as PCP - General (Family Medicine) Marcy Panning, MD as Consulting Physician (Internal Medicine) Thea Silversmith, MD as Consulting Physician (Radiation Oncology) Nicholas Lose, MD as Consulting Physician (Hematology and Oncology) Gaynelle Arabian, MD as Consulting Physician (Orthopedic Surgery) Danella Sensing, MD as Consulting Physician (Dermatology) Melissa Noon, [place] as Consulting Physician (Optometry) Ernestine Conrad, MD as Consulting Physician (Otolaryngology)    Assessment:   This is a routine wellness examination for Nabeela.  Exercise Activities and Dietary recommendations Current Exercise Habits: Home exercise routine, Type of exercise: walking, Time (Minutes): 30, Frequency (Times/Week): 4, Weekly Exercise (Minutes/Week): 120, Intensity: Mild  Goals   None     Fall Risk Fall Risk  06/15/2019 11/18/2017 03/28/2016 03/25/2016 02/21/2015  Falls in the past year? 0 No No Yes No  Comment - Emmi Telephone Survey: data to providers prior to load Emmi Telephone Survey: data to providers prior to load - -  Number falls in past yr: 0 - - - -  Injury with Fall? 0 - - - -  Follow up Falls evaluation completed;Education provided;Falls prevention discussed - - - -   Is the patient's home free of loose throw rugs in walkways, pet beds, electrical cords, etc?   yes      Grab bars in the bathroom? yes      Handrails on the stairs?   yes      Adequate lighting?   yes   Depression Screen PHQ 2/9 Scores 06/15/2019 08/16/2018 03/25/2016 02/21/2015  PHQ - 2 Score 0 0 0 0  Cognitive Function     6CIT Screen 06/15/2019  What Year? 0 points  What month? 0 points  What time? 0 points  Count back from 20 0 points  Months in  reverse 0 points  Repeat phrase 0 points  Total Score 0    Immunization History  Administered Date(s) Administered  . Fluad Quad(high Dose 65+) 01/15/2019  . Influenza Split 02/10/2012  . Influenza, High Dose Seasonal PF 03/01/2013, 02/06/2014, 02/21/2015, 01/28/2016, 01/08/2017, 01/25/2018  . PFIZER SARS-COV-2 Vaccination 05/08/2019, 05/27/2019  . Pneumococcal Conjugate-13 02/20/2014  . Pneumococcal Polysaccharide-23 09/24/2011  . Zoster 05/30/2015    Qualifies for Shingles Vaccine?Discussed and patient will check with pharmacy for coverage.  Patient education handout provided   Screening Tests Health Maintenance  Topic Date Due  . TETANUS/TDAP  04/21/2016  . MAMMOGRAM  02/03/2020  . COLONOSCOPY  02/13/2021  . INFLUENZA VACCINE  Completed  . DEXA SCAN  Completed  . Hepatitis C Screening  Completed  . PNA vac Low Risk Adult  Completed    Cancer Screenings: Lung: Low Dose CT Chest recommended if Age 46-80 years, 30 pack-year currently smoking OR have quit w/in 15years. Patient does not qualify. Breast:  Up to date on Mammogram? Yes   Up to date of Bone Density/Dexa? Yes Colorectal: colonoscopy 02/14/16     Plan:  I have personally reviewed and addressed the Medicare Annual Wellness questionnaire and have noted the following in the patient's chart:  A. Medical and social history B. Use of alcohol, tobacco or illicit drugs  C. Current medications and supplements D. Functional ability and status E.  Nutritional status F.  Physical activity G. Advance directives H. List of other physicians I.  Hospitalizations, surgeries, and ER visits in previous 12 months J.  [place] such as hearing and vision if needed, cognitive and depression L. Referrals, records requested, and appointments- none   In addition, I have reviewed and discussed with patient certain preventive protocols, quality metrics, and best practice recommendations. A written personalized care plan  for preventive services as well as general preventive health recommendations were provided to patient.   Signed,  Denman George, LPN  Nurse Health Advisor   Nurse Notes: Patient has completed Covid vaccine

## 2019-06-15 NOTE — Patient Instructions (Signed)
Kristen Escobar , Thank you for taking time to come for your Medicare Wellness Visit. I appreciate your ongoing commitment to your health goals. Please review the following plan we discussed and let me know if I can assist you in the future.   Screening recommendations/referrals: Colorectal Screening: up to date; last colonoscopy 02/14/16 Mammogram: up to date; last 02/03/19 Bone Density: up to date; last 12/28/15   Vision and Dental Exams: Recommended annual ophthalmology exams for early detection of glaucoma and other disorders of the eye Recommended annual dental exams for proper oral hygiene  Vaccinations: Influenza vaccine: completed 01/15/19 Pneumococcal vaccine: up to date; last 02/20/14  Tdap vaccine: recommended; Please call your insurance company to determine your out of pocket expense. You may receive this vaccine at your local pharmacy or Health Dept. Shingles vaccine: Please call your insurance company to determine your out of pocket expense for the Shingrix vaccine. You may receive this vaccine at your local pharmacy. (see attached handout)   Advanced directives: Please bring a copy of your POA (Power of Attorney) and/or Living Will to your next appointment.  Goals: Recommend to drink at least 6-8 8oz glasses of water per day and consume a balanced diet rich in fresh fruits and vegetables.   Next appointment: Please schedule your Annual Wellness Visit with your Nurse Health Advisor in one year.  Preventive Care 9 Years and Older, Female Preventive care refers to lifestyle choices and visits with your health care provider that can promote health and wellness. What does preventive care include?  A yearly physical exam. This is also called an annual well check.  Dental exams once or twice a year.  Routine eye exams. Ask your health care provider how often you should have your eyes checked.  Personal lifestyle choices, including:  Daily care of your teeth and gums.  Regular  physical activity.  Eating a healthy diet.  Avoiding tobacco and drug use.  Limiting alcohol use.  Practicing safe sex.  Taking low-dose aspirin every day if recommended by your health care provider.  Taking vitamin and mineral supplements as recommended by your health care provider. What happens during an annual well check? The services and screenings done by your health care provider during your annual well check will depend on your age, overall health, lifestyle risk factors, and family history of disease. Counseling  Your health care provider may ask you questions about your:  Alcohol use.  Tobacco use.  Drug use.  Emotional well-being.  Home and relationship well-being.  Sexual activity.  Eating habits.  History of falls.  Memory and ability to understand (cognition).  Work and work Statistician.  Reproductive health. Screening  You may have the following tests or measurements:  Height, weight, and BMI.  Blood pressure.  Lipid and cholesterol levels. These may be checked every 5 years, or more frequently if you are over 50 years old.  Skin check.  Lung cancer screening. You may have this screening every year starting at age 8 if you have a 30-pack-year history of smoking and currently smoke or have quit within the past 15 years.  Fecal occult blood test (FOBT) of the stool. You may have this test every year starting at age 45.  Flexible sigmoidoscopy or colonoscopy. You may have a sigmoidoscopy every 5 years or a colonoscopy every 10 years starting at age 5.  Hepatitis C blood test.  Hepatitis B blood test.  Sexually transmitted disease (STD) testing.  Diabetes screening. This is done by checking  your blood sugar (glucose) after you have not eaten for a while (fasting). You may have this done every 1-3 years.  Bone density scan. This is done to screen for osteoporosis. You may have this done starting at age 66.  Mammogram. This may be done every  1-2 years. Talk to your health care provider about how often you should have regular mammograms. Talk with your health care provider about your test results, treatment options, and if necessary, the need for more tests. Vaccines  Your health care provider may recommend certain vaccines, such as:  Influenza vaccine. This is recommended every year.  Tetanus, diphtheria, and acellular pertussis (Tdap, Td) vaccine. You may need a Td booster every 10 years.  Zoster vaccine. You may need this after age 38.  Pneumococcal 13-valent conjugate (PCV13) vaccine. One dose is recommended after age 43.  Pneumococcal polysaccharide (PPSV23) vaccine. One dose is recommended after age 66. Talk to your health care provider about which screenings and vaccines you need and how often you need them. This information is not intended to replace advice given to you by your health care provider. Make sure you discuss any questions you have with your health care provider. Document Released: 05/04/2015 Document Revised: 12/26/2015 Document Reviewed: 02/06/2015 Elsevier Interactive Patient Education  2017 [place] Prevention in the Home Falls can cause injuries. They can happen to people of all ages. There are many things you can do to make your home safe and to help prevent falls. What can I do on the outside of my home?  Regularly fix the edges of walkways and driveways and fix any cracks.  Remove anything that might make you trip as you walk through a door, such as a raised step or threshold.  Trim any bushes or trees on the path to your home.  Use bright outdoor lighting.  Clear any walking paths of anything that might make someone trip, such as rocks or tools.  Regularly check to see if handrails are loose or broken. Make sure that both sides of any steps have handrails.  Any raised decks and porches should have guardrails on the edges.  Have any leaves, snow, or ice cleared regularly.  Use  sand or salt on walking paths during winter.  Clean up any spills in your garage right away. This includes oil or grease spills. What can I do in the bathroom?  Use night lights.  Install grab bars by the toilet and in the tub and shower. Do not use towel bars as grab bars.  Use non-skid mats or decals in the tub or shower.  If you need to sit down in the shower, use a plastic, non-slip stool.  Keep the floor dry. Clean up any water that spills on the floor as soon as it happens.  Remove soap buildup in the tub or shower regularly.  Attach bath mats securely with double-sided non-slip rug tape.  Do not have throw rugs and other things on the floor that can make you trip. What can I do in the bedroom?  Use night lights.  Make sure that you have a light by your bed that is easy to reach.  Do not use any sheets or blankets that are too big for your bed. They should not hang down onto the floor.  Have a firm chair that has side arms. You can use this for support while you get dressed.  Do not have throw rugs and other things on the floor  that can make you trip. What can I do in the kitchen?  Clean up any spills right away.  Avoid walking on wet floors.  Keep items that you use a lot in easy-to-reach places.  If you need to reach something above you, use a strong step stool that has a grab bar.  Keep electrical cords out of the way.  Do not use floor polish or wax that makes floors slippery. If you must use wax, use non-skid floor wax.  Do not have throw rugs and other things on the floor that can make you trip. What can I do with my stairs?  Do not leave any items on the stairs.  Make sure that there are handrails on both sides of the stairs and use them. Fix handrails that are broken or loose. Make sure that handrails are as long as the stairways.  Check any carpeting to make sure that it is firmly attached to the stairs. Fix any carpet that is loose or worn.  Avoid  having throw rugs at the top or bottom of the stairs. If you do have throw rugs, attach them to the floor with carpet tape.  Make sure that you have a light switch at the top of the stairs and the bottom of the stairs. If you do not have them, ask someone to add them for you. What else can I do to help prevent falls?  Wear shoes that:  Do not have high heels.  Have rubber bottoms.  Are comfortable and fit you well.  Are closed at the toe. Do not wear sandals.  If you use a stepladder:  Make sure that it is fully opened. Do not climb a closed stepladder.  Make sure that both sides of the stepladder are locked into place.  Ask someone to hold it for you, if possible.  Clearly mark and make sure that you can see:  Any grab bars or handrails.  First and last steps.  Where the edge of each step is.  Use tools that help you move around (mobility aids) if they are needed. These include:  Canes.  Walkers.  Scooters.  Crutches.  Turn on the lights when you go into a dark area. Replace any light bulbs as soon as they burn out.  Set up your furniture so you have a clear path. Avoid moving your furniture around.  If any of your floors are uneven, fix them.  If there are any pets around you, be aware of where they are.  Review your medicines with your doctor. Some medicines can make you feel dizzy. This can increase your chance of falling. Ask your doctor what other things that you can do to help prevent falls. This information is not intended to replace advice given to you by your health care provider. Make sure you discuss any questions you have with your health care provider. Document Released: 02/01/2009 Document Revised: 09/13/2015 Document Reviewed: 05/12/2014 Elsevier Interactive Patient Education  2017 [employer].

## 2019-06-28 DIAGNOSIS — M6281 Muscle weakness (generalized): Secondary | ICD-10-CM | POA: Diagnosis not present

## 2019-06-28 DIAGNOSIS — M25661 Stiffness of right knee, not elsewhere classified: Secondary | ICD-10-CM | POA: Diagnosis not present

## 2019-06-28 DIAGNOSIS — Z96651 Presence of right artificial knee joint: Secondary | ICD-10-CM | POA: Diagnosis not present

## 2019-07-07 ENCOUNTER — Other Ambulatory Visit (HOSPITAL_COMMUNITY): Payer: Self-pay | Admitting: Surgery

## 2019-07-07 ENCOUNTER — Other Ambulatory Visit: Payer: Self-pay | Admitting: Surgery

## 2019-07-07 DIAGNOSIS — K316 Fistula of stomach and duodenum: Secondary | ICD-10-CM

## 2019-07-12 DIAGNOSIS — M25661 Stiffness of right knee, not elsewhere classified: Secondary | ICD-10-CM | POA: Diagnosis not present

## 2019-07-12 DIAGNOSIS — M25669 Stiffness of unspecified knee, not elsewhere classified: Secondary | ICD-10-CM | POA: Insufficient documentation

## 2019-07-19 DIAGNOSIS — M25661 Stiffness of right knee, not elsewhere classified: Secondary | ICD-10-CM | POA: Diagnosis not present

## 2019-07-19 DIAGNOSIS — Z20822 Contact with and (suspected) exposure to covid-19: Secondary | ICD-10-CM | POA: Diagnosis not present

## 2019-07-19 DIAGNOSIS — Z20828 Contact with and (suspected) exposure to other viral communicable diseases: Secondary | ICD-10-CM | POA: Diagnosis not present

## 2019-07-22 ENCOUNTER — Ambulatory Visit
Admission: RE | Admit: 2019-07-22 | Discharge: 2019-07-22 | Disposition: A | Discharge status: Home / Self Care | Payer: Medicare Other | Source: Ambulatory Visit | Attending: Surgery | Admitting: Surgery

## 2019-07-22 ENCOUNTER — Other Ambulatory Visit: Payer: Self-pay

## 2019-07-22 DIAGNOSIS — K316 Fistula of stomach and duodenum: Secondary | ICD-10-CM | POA: Diagnosis not present

## 2019-07-22 DIAGNOSIS — K8020 Calculus of gallbladder without cholecystitis without obstruction: Secondary | ICD-10-CM | POA: Diagnosis not present

## 2019-07-26 DIAGNOSIS — Z8579 Personal history of other malignant neoplasms of lymphoid, hematopoietic and related tissues: Secondary | ICD-10-CM | POA: Diagnosis not present

## 2019-07-26 DIAGNOSIS — K222 Esophageal obstruction: Secondary | ICD-10-CM | POA: Diagnosis not present

## 2019-07-26 DIAGNOSIS — R1314 Dysphagia, pharyngoesophageal phase: Secondary | ICD-10-CM | POA: Diagnosis not present

## 2019-07-26 DIAGNOSIS — J860 Pyothorax with fistula: Secondary | ICD-10-CM | POA: Diagnosis not present

## 2019-07-26 DIAGNOSIS — K219 Gastro-esophageal reflux disease without esophagitis: Secondary | ICD-10-CM | POA: Diagnosis not present

## 2019-07-26 DIAGNOSIS — R1313 Dysphagia, pharyngeal phase: Secondary | ICD-10-CM | POA: Diagnosis not present

## 2019-07-26 DIAGNOSIS — R1310 Dysphagia, unspecified: Secondary | ICD-10-CM | POA: Diagnosis not present

## 2019-07-26 DIAGNOSIS — Z87891 Personal history of nicotine dependence: Secondary | ICD-10-CM | POA: Diagnosis not present

## 2019-07-28 DIAGNOSIS — M25669 Stiffness of unspecified knee, not elsewhere classified: Secondary | ICD-10-CM | POA: Diagnosis not present

## 2019-08-01 DIAGNOSIS — M25669 Stiffness of unspecified knee, not elsewhere classified: Secondary | ICD-10-CM | POA: Diagnosis not present

## 2019-08-03 DIAGNOSIS — K316 Fistula of stomach and duodenum: Secondary | ICD-10-CM | POA: Diagnosis not present

## 2019-08-03 DIAGNOSIS — Z8579 Personal history of other malignant neoplasms of lymphoid, hematopoietic and related tissues: Secondary | ICD-10-CM | POA: Diagnosis not present

## 2019-08-03 DIAGNOSIS — K222 Esophageal obstruction: Secondary | ICD-10-CM | POA: Diagnosis not present

## 2019-08-04 DIAGNOSIS — M25669 Stiffness of unspecified knee, not elsewhere classified: Secondary | ICD-10-CM | POA: Diagnosis not present

## 2019-08-08 DIAGNOSIS — M25669 Stiffness of unspecified knee, not elsewhere classified: Secondary | ICD-10-CM | POA: Diagnosis not present

## 2019-08-11 DIAGNOSIS — M25669 Stiffness of unspecified knee, not elsewhere classified: Secondary | ICD-10-CM | POA: Diagnosis not present

## 2019-08-15 DIAGNOSIS — M25669 Stiffness of unspecified knee, not elsewhere classified: Secondary | ICD-10-CM | POA: Diagnosis not present

## 2019-08-18 DIAGNOSIS — M25669 Stiffness of unspecified knee, not elsewhere classified: Secondary | ICD-10-CM | POA: Diagnosis not present

## 2019-08-25 DIAGNOSIS — M25661 Stiffness of right knee, not elsewhere classified: Secondary | ICD-10-CM | POA: Diagnosis not present

## 2019-08-25 DIAGNOSIS — Z96651 Presence of right artificial knee joint: Secondary | ICD-10-CM | POA: Diagnosis not present

## 2019-08-25 DIAGNOSIS — M6281 Muscle weakness (generalized): Secondary | ICD-10-CM | POA: Diagnosis not present

## 2019-08-31 NOTE — Patient Instructions (Addendum)
DUE TO COVID-19 ONLY ONE VISITOR IS ALLOWED TO COME WITH YOU AND STAY IN THE WAITING ROOM ONLY DURING PRE OP AND PROCEDURE DAY OF SURGERY. TWO VISITOR MAY VISIT WITH YOU AFTER SURGERY IN YOUR PRIVATE ROOM DURING VISITING HOURS ONLY!  10a-8p  YOU NEED TO HAVE A COVID 19 TEST ON_5-18-21______ @_11 :00______, THIS TEST MUST BE DONE BEFORE SURGERY, COME  [address], [place] , [address].  ([place]) ONCE YOUR COVID TEST IS COMPLETED, PLEASE BEGIN THE QUARANTINE INSTRUCTIONS AS OUTLINED IN YOUR HANDOUT.                Kashae Swonger  08/31/2019   Your procedure is scheduled on: 09-09-19   Report to [facility] Main  Entrance   Report to short stay  at       0530  AM     Call this number if you have problems the morning of surgery  [phone]    Remember: Do not eat food or drink liquids :After Midnight.   BRUSH YOUR TEETH MORNING OF SURGERY AND RINSE YOUR MOUTH OUT, NO CHEWING GUM CANDY OR MINTS.     Take these medicines the morning of surgery with A SIP OF WATER: none                                 You may not have any metal on your body including hair pins and              piercings  Do not wear jewelry, make-up, lotions, powders or perfumes, deodorant             Do not wear nail polish on your fingernails.  Do not shave  48 hours prior to surgery.     Do not bring valuables to the hospital. [place].  Contacts, dentures or bridgework may not be worn into surgery.      Patients discharged the day of surgery will not be allowed to drive home. IF YOU ARE HAVING SURGERY AND GOING HOME THE SAME DAY, YOU MUST HAVE AN ADULT TO DRIVE YOU HOME AND BE WITH YOU FOR 24 HOURS. YOU MAY GO HOME BY TAXI OR UBER OR ORTHERWISE, BUT AN ADULT MUST ACCOMPANY YOU HOME AND STAY WITH YOU FOR 24 HOURS.  Name and phone number of your driver:  Special Instructions: N/A              Please read over the following fact  sheets you were given: _____________________________________________________________________             [facility] - Preparing for Surgery Before surgery, you can play an important role.  Because skin is not sterile, your skin needs to be as free of germs as possible.  You can reduce the number of germs on your skin by washing with CHG (chlorahexidine gluconate) soap before surgery.  CHG is an antiseptic cleaner which kills germs and bonds with the skin to continue killing germs even after washing. Please DO NOT use if you have an allergy to CHG or antibacterial soaps.  If your skin becomes reddened/irritated stop using the CHG and inform your nurse when you arrive at Short Stay. Do not shave (including legs and underarms) for at least 48 hours prior to the first CHG shower.  You  may shave your face/neck. Please follow these instructions carefully:  1.  Shower with CHG Soap the night before surgery and the  morning of Surgery.  2.  If you choose to wash your hair, wash your hair first as usual with your  normal  shampoo.  3.  After you shampoo, rinse your hair and body thoroughly to remove the  shampoo.                           4.  Use CHG as you would any other liquid soap.  You can apply chg directly  to the skin and wash                       Gently with a scrungie or clean washcloth.  5.  Apply the CHG Soap to your body ONLY FROM THE NECK DOWN.   Do not use on face/ open                           Wound or open sores. Avoid contact with eyes, ears mouth and genitals (private parts).                       Wash face,  Genitals (private parts) with your normal soap.             6.  Wash thoroughly, paying special attention to the area where your surgery  will be performed.  7.  Thoroughly rinse your body with warm water from the neck down.  8.  DO NOT shower/wash with your normal soap after using and rinsing off  the CHG Soap.                9.  Pat yourself dry with a clean towel.             10.  Wear clean pajamas.            11.  Place clean sheets on your bed the night of your first shower and do not  sleep with pets. Day of Surgery : Do not apply any lotions/deodorants the morning of surgery.  Please wear clean clothes to the hospital/surgery center.  FAILURE TO FOLLOW THESE INSTRUCTIONS MAY RESULT IN THE CANCELLATION OF YOUR SURGERY PATIENT SIGNATURE_________________________________  NURSE SIGNATURE__________________________________  ________________________________________________________________________

## 2019-09-02 ENCOUNTER — Encounter (HOSPITAL_COMMUNITY): Payer: Self-pay

## 2019-09-02 ENCOUNTER — Other Ambulatory Visit: Payer: Self-pay

## 2019-09-02 ENCOUNTER — Encounter (HOSPITAL_COMMUNITY)
Admission: RE | Admit: 2019-09-02 | Discharge: 2019-09-02 | Disposition: A | Discharge status: Home / Self Care | Payer: Medicare Other | Source: Ambulatory Visit | Attending: Surgery | Admitting: Surgery

## 2019-09-02 DIAGNOSIS — E669 Obesity, unspecified: Secondary | ICD-10-CM | POA: Insufficient documentation

## 2019-09-02 DIAGNOSIS — I10 Essential (primary) hypertension: Secondary | ICD-10-CM | POA: Insufficient documentation

## 2019-09-02 DIAGNOSIS — Z6834 Body mass index (BMI) 34.0-34.9, adult: Secondary | ICD-10-CM | POA: Diagnosis not present

## 2019-09-02 DIAGNOSIS — K219 Gastro-esophageal reflux disease without esophagitis: Secondary | ICD-10-CM | POA: Diagnosis not present

## 2019-09-02 DIAGNOSIS — Z79899 Other long term (current) drug therapy: Secondary | ICD-10-CM | POA: Diagnosis not present

## 2019-09-02 DIAGNOSIS — Z87891 Personal history of nicotine dependence: Secondary | ICD-10-CM | POA: Insufficient documentation

## 2019-09-02 DIAGNOSIS — E039 Hypothyroidism, unspecified: Secondary | ICD-10-CM | POA: Diagnosis not present

## 2019-09-02 DIAGNOSIS — K316 Fistula of stomach and duodenum: Secondary | ICD-10-CM | POA: Diagnosis not present

## 2019-09-02 DIAGNOSIS — Z01818 Encounter for other preprocedural examination: Secondary | ICD-10-CM | POA: Diagnosis not present

## 2019-09-02 HISTORY — DX: Other complications of anesthesia, initial encounter: T88.59XA

## 2019-09-02 LAB — CBC WITH DIFFERENTIAL/PLATELET
Abs Immature Granulocytes: 0.03 10*3/uL (ref 0.00–0.07)
Basophils Absolute: 0.1 10*3/uL (ref 0.0–0.1)
Basophils Relative: 1 %
Eosinophils Absolute: 0.4 10*3/uL (ref 0.0–0.5)
Eosinophils Relative: 3 %
HCT: 39.6 % (ref 36.0–46.0)
Hemoglobin: 12.3 g/dL (ref 12.0–15.0)
Immature Granulocytes: 0 %
Lymphocytes Relative: 20 %
Lymphs Abs: 2.4 10*3/uL (ref 0.7–4.0)
MCH: 25.7 pg — ABNORMAL LOW (ref 26.0–34.0)
MCHC: 31.1 g/dL (ref 30.0–36.0)
MCV: 82.8 fL (ref 80.0–100.0)
Monocytes Absolute: 1.1 10*3/uL — ABNORMAL HIGH (ref 0.1–1.0)
Monocytes Relative: 9 %
Neutro Abs: 8 10*3/uL — ABNORMAL HIGH (ref 1.7–7.7)
Neutrophils Relative %: 67 %
Platelets: 321 10*3/uL (ref 150–400)
RBC: 4.78 MIL/uL (ref 3.87–5.11)
RDW: 15.2 % (ref 11.5–15.5)
WBC: 11.9 10*3/uL — ABNORMAL HIGH (ref 4.0–10.5)
nRBC: 0 % (ref 0.0–0.2)

## 2019-09-02 LAB — COMPREHENSIVE METABOLIC PANEL
ALT: 13 U/L (ref 0–44)
AST: 21 U/L (ref 15–41)
Albumin: 3.2 g/dL — ABNORMAL LOW (ref 3.5–5.0)
Alkaline Phosphatase: 63 U/L (ref 38–126)
Anion gap: 9 (ref 5–15)
BUN: 25 mg/dL — ABNORMAL HIGH (ref 8–23)
CO2: 27 mmol/L (ref 22–32)
Calcium: 8.7 mg/dL — ABNORMAL LOW (ref 8.9–10.3)
Chloride: 101 mmol/L (ref 98–111)
Creatinine, Ser: 0.96 mg/dL (ref 0.44–1.00)
GFR calc Af Amer: >60 mL/min (ref >60)
GFR calc non Af Amer: 59 mL/min — ABNORMAL LOW (ref >60)
Glucose, Bld: 94 mg/dL (ref 70–99)
Potassium: 4.9 mmol/L (ref 3.5–5.1)
Sodium: 137 mmol/L (ref 135–145)
Total Bilirubin: 0.7 mg/dL (ref 0.3–1.2)
Total Protein: 7.7 g/dL (ref 6.5–8.1)

## 2019-09-02 NOTE — Progress Notes (Signed)
PCP - Carolann Littler Cardiologist -   Chest x-ray -  EKG - 09-02-19 epic Stress Test -  ECHO -  Cardiac Cath -   Sleep Study -  CPAP -   Fasting Blood Sugar -  Checks Blood Sugar _____ times a day  Blood Thinner Instructions: Aspirin Instructions: Last Dose:  Anesthesia review: Anesthesia consult esophageal trachea fistula  Patient denies shortness of breath, fever, cough and chest pain at PAT appointment  none   Patient verbalized understanding of instructions that were given to them at the PAT appointment. Patient was also instructed that they will need to review over the PAT instructions again at home before surgery.

## 2019-09-05 NOTE — Progress Notes (Signed)
Anesthesia Chart Review   Case: [id number] Date/Time: 09/09/19 0715   Procedure: LAPAROSCOPIC CLOSURE OF GASTRO-CUTANEOUS FISTULA (N/A )   Anesthesia type: General   Pre-op diagnosis: GASTROCUTANEOUS FISTULA   [facility] RO[facility] 01 [facility]   Surgeons: Alphonsa Overall, MD      DISCUSSION:74 y.o. 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 Alphonsa Overall.    H/o lymphoma causing vocal cord paralysis, tracheoesophageal fistula, esophageal stricture s/p multiple esophageal dilations, most recently 07/26/2019.  With most recent dilation it is noted that tracheoesophageal fistula resolved in op note.  No anesthesia complications noted per anesthesia note.  Previous anesthesia note from [id number] with no complications noted.  Per this note, ETT size 4.38mm used with 1 attempt.    Anesthesia consult requested.  Pt seen by Dr. Lissa Hoard during PAT visit.  Anticipate pt can proceed with planned procedure barring acute status change.   VS: BP (!) 164/69   Pulse 72   Temp 36.7 C (Oral)   Resp 18   Ht 5\' 2"  (1.575 m)   Wt 85.4 kg   SpO2 98%   BMI 34.43 kg/m   PROVIDERS: Eulas Post, MD is PCP   Carol Ada, MD is ENT LABS: forwarded to surgeon (all labs ordered are listed, but only abnormal results are displayed)  Labs Reviewed  CBC WITH DIFFERENTIAL/PLATELET - Abnormal; Notable for the following components:      Result Value   WBC 11.9 (*)    MCH 25.7 (*)    Neutro Abs 8.0 (*)    Monocytes Absolute 1.1 (*)    All other components within normal limits  COMPREHENSIVE METABOLIC PANEL - Abnormal; Notable for the following components:   BUN 25 (*)    Calcium 8.7 (*)    Albumin 3.2 (*)    GFR calc non Af Amer 59 (*)    All other components within normal limits     IMAGES:   EKG: 09/02/2019 Rate 73 bpm Normal sinus rhythm    CV: Echo 02/18/2017 Study Conclusions   - Left ventricle: The cavity size was normal. Wall thickness was  increased in a pattern of mild LVH. Systolic function was normal.  The estimated ejection fraction was in the range of 60% to 65%.  Wall motion was normal; there were no regional wall motion  abnormalities. Doppler parameters are consistent with abnormal  left ventricular relaxation (grade 1 diastolic dysfunction). The  E/e&' ratio is <8, suggesting normal LV filling pressure.  - Left atrium: The atrium was mildly dilated.  - Right ventricle: The cavity size was normal. Wall thickness was  normal. The moderator band was prominent. Systolic function was  normal.  - Right atrium: The atrium was at the upper limits of normal in  size.  - Tricuspid valve: There was trivial regurgitation.  - Pulmonary arteries: PA peak pressure: 27 mm Hg (S).  - Inferior vena cava: The vessel was normal in size. The  respirophasic diameter changes were in the normal range (>= 50%),  consistent with normal central venous pressure.   Impressions:   - Compared to a prior study in 12/2016, there are no significant  changes.  Past Medical History:  Diagnosis Date  . Acute respiratory failure with hypoxia ([place]) 02/16/2017  . Arthritis   . Cancer ([place]) 03/2009   breast- rt  . Complication of anesthesia  Hard to void after knee replacement 05-2018  . Diffuse large B cell lymphoma ([place]) dx'd 02/17/17   in remission 2 years now   had chemo   . GERD (gastroesophageal reflux disease)   . History of radiation therapy 07/12/10,completed   right breast 60 Gy x30 fx  . Hypertension    no meds currently  . Hypothyroidism   . Obesity   . Peripheral vascular disease ([place]) 1995   PT DEVELOPED CIRCULATION PROBLEMS IN BOTH HANDS AND GANGRENE OF BOTH INDEX FINGERS--REQUIRING AMPUTATION OF THE INDEX FINGERS AND VASCULAR SURGERY.  PT TOLD HER PROBLEMS RELATED TO SMOKING.   NO OTHER  PROBLEMS SINCE.  Marland Kitchen Personal history of radiation therapy 2012   Right Breast Cancer  . Pneumonia   . Thyroid mass     Past Surgical History:  Procedure Laterality Date  . AMPUTATION     partial amputation of both index fingers  . BREAST LUMPECTOMY Right 04/01/2010  . BREAST SURGERY  2011   lumpectomy with node sampling- RIGHT  . CATARACT EXTRACTION, BILATERAL  approx 2017  . COLONOSCOPY    . ESOPHAGOGASTRODUODENOSCOPY    . EXCISION MASS NECK Left 12/26/2016   Procedure: EXCISION MASS NECK;  Surgeon: Izora Gala, MD;  Location: [place];  Service: ENT;  Laterality: Left;  open excision of thyroid mass left side with frozen section  . HAND SURGERY Bilateral 1995   Amputaed pointer fingers bilaterally  . IR FLUORO GUIDE PORT INSERTION RIGHT  01/14/2017  . IR GASTROSTOMY TUBE MOD SED  02/19/2017  . IR GASTROSTOMY TUBE REMOVAL  12/07/2018  . IR PATIENT EVAL TECH 0-60 MINS  01/14/2019  . IR REMOVAL TUN ACCESS W/ PORT W/O FL MOD SED  11/30/2017  . IR REPLACE G-TUBE SIMPLE WO FLUORO  08/03/2017  . IR REPLACE G-TUBE SIMPLE WO FLUORO  06/10/2018  . IR REPLC GASTRO/COLONIC TUBE PERCUT W/FLUORO  02/25/2017  . IR [place] TUBE PERCUT W/FLUORO  03/09/2017  . IR US GUIDE BX ASP/DRAIN  12/07/2018  . IR US GUIDE VASC ACCESS RIGHT  01/14/2017  . JOINT REPLACEMENT    . TOTAL HIP ARTHROPLASTY  12/16/2011   right hip  . TOTAL HIP ARTHROPLASTY  01/20/2012   Procedure: TOTAL HIP ARTHROPLASTY ANTERIOR APPROACH;  Surgeon: Mauri Pole, MD;  [facility];  Service: Orthopedics;  Laterality: Left;  . TOTAL KNEE ARTHROPLASTY Right 05/31/2018   Procedure: TOTAL KNEE ARTHROPLASTY;  Surgeon: Gaynelle Arabian, MD;  [facility];  Service: Orthopedics;  Laterality: Right;  Adductor Block  . TUBAL LIGATION    . VASCULAR SURGERY     both hands    MEDICATIONS: . furosemide (LASIX) 20 MG tablet  . levothyroxine (SYNTHROID) 50 MCG tablet  . loperamide (IMODIUM A-D) 2 MG tablet  . Multiple  Vitamins-Minerals (ADULT GUMMY PO)  . omeprazole (PRILOSEC) 20 MG capsule   No current facility-administered medications for this encounter.     Maia Plan [facility] [place] Pre-Surgical Testing [phone] 09/06/19  3:37 PM

## 2019-09-06 ENCOUNTER — Other Ambulatory Visit (HOSPITAL_COMMUNITY)
Admission: RE | Admit: 2019-09-06 | Discharge: 2019-09-06 | Disposition: A | Discharge status: Home / Self Care | Payer: Medicare Other | Source: Ambulatory Visit | Attending: Surgery | Admitting: Surgery

## 2019-09-06 LAB — SARS CORONAVIRUS 2 (TAT 6-24 HRS): SARS Coronavirus 2: NEGATIVE

## 2019-09-06 NOTE — Anesthesia Preprocedure Evaluation (Addendum)
Anesthesia Evaluation  Patient identified by MRN, date of birth, ID band Patient awake    History of Anesthesia Complications (+) history of anesthetic complications  Airway Mallampati: II  TM Distance: >3 FB     Dental   Pulmonary pneumonia, resolved, former smoker,    breath sounds clear to auscultation       Cardiovascular hypertension, Pt. on medications + Peripheral Vascular Disease   Rhythm:Regular Rate:Normal  Buerger's disease- S/P amputation of some fingers both hands Hx/o bilateral carotid bruits EKG- NSR, normal   Neuro/Psych Hx/o intracranial hemorrhage negative psych ROS   GI/Hepatic Neg liver ROS, GERD  Medicated and Controlled,Hx/o Tracheo-esophageal fistula closed according to last EGD Hx/o esophageal mass- B cell lymphoma Gastro-cutaneous fistula Uses feeding tube BPPV    Endo/Other  Hypothyroidism Hx/o morbid obesity Hyperglycemia Obesity Hx/o right breast Ca- S/P RT  Renal/GU Renal InsufficiencyRenal disease  negative genitourinary   Musculoskeletal  (+) Arthritis , Osteoarthritis,  S/P Bilateral TKR S/P hip replacement   Abdominal   Peds  Hematology Hx/o B-cell lymphoma- S/P ChemoRx   Anesthesia Other Findings   Reproductive/Obstetrics                         Anesthesia Physical Anesthesia Plan  ASA: III  Anesthesia Plan: General   Post-op Pain Management:    Induction: Intravenous and Cricoid pressure planned  PONV Risk Score and Plan: 4 or greater and Ondansetron and Treatment may vary due to age or medical condition  Airway Management Planned: Oral ETT  Additional Equipment:   Intra-op Plan:   Post-operative Plan: Extubation in OR and Possible Post-op intubation/ventilation  Informed Consent: I have reviewed the patients History and Physical, chart, labs and discussed the procedure including the risks, benefits and alternatives for the proposed  anesthesia with the patient or authorized representative who has indicated his/her understanding and acceptance.     Dental advisory given  Plan Discussed with: CRNA and Anesthesiologist  Anesthesia Plan Comments: (See PAT note 09/02/2019, Konrad Felix, PA-C)      Anesthesia Quick Evaluation

## 2019-09-08 NOTE — H&P (Signed)
Kristen Escobar  Location: [facility] Surgery Patient #: [id number] DOB: March 03, 1946 Married / Language: English / Race: White Female  History of Present Illness   The patient is a 74 year old female who presents with a complaint of gastrostomy issues.  The PCP is Dr. Girtha Hake  She comes with her husband.  She underwent a dilation of her esophagus by Dr. Joya Gaskins on 07/26/2019. They said that he dilated her to 48 F. This was the first dilation since her esophagus had healed and she was off the tube feeds. This went well. She looks good today. She has done well with the gastrocutaneous fistula and has no new fistula that have formed. In fact, I think that things look better to me today. I went over the CT scan that we did on 07/22/2019. They already had looked at it. I gave them a copy of the results. I went over with them the surgery, the recovery, and the potential complications.  Plan: 1. Laparoscopic repair of gastro-cutaneous fistula - 5/21  History of gastric fistula (Jan 2021): She had a mass in her neck that proved to be a lymphoma on biopsy by Dr. Elie Goody in September 2018. Her lymphoma treatment with chemotherapy was complicated by a tracheoesophageal fistula followed by Dr. Nicolette Bang at [facility]. Dr. Carol Ada was involved in for injection augmentation. She had insertion of a gastrostomy tube by IR on February 19, 2017, for feeding while they were dealing with the TE fistula. This tube underwent several replacements. It looks like the G tube was removed on 12/07/2018. At the time of gastrostomy tube removal she had a biopsy of the tissue around the G-tube which was benign. According to the patient, she has a stenosis in her proximal esophagus that Dr. Joya Gaskins has been addressing with dilations. She had an esophagram on 03/28/2019 that showed a stricture of the low cervical esophageal stricture. This was done at [facility], so I  don't have access to the images. She never had radiation to the neck. She saw Dr. Coralie Common for the skin changes around the gastrostomy tube site. So her issue has been continued drainage from her gastrostomy tube site with surrounding inflammatory sinuses probably all related to the persistent gastrocutaneous fistula.  Review of Systems as stated in this history (HPI) or in the review of systems. Otherwise all other 12 point ROS are negative  Past Medical History: 1. 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 Got Covid vaccine 05/08/2019 3. Hypothyroid 4. GERD 5. History of diffuse B cell lymphoma diagnosed in September 2018 Seen by Dr. Lindi Adie 6. Right breast cancer - 2011 Dr. Zenia Resides and Humphrey Rolls 7. History of TE fistula Repaired by Dr. Carol Ada at [facility] - 06/11/2017 8. Finger amputation - 1995 9. Gall stones 10. PE treated with Lovenox - October 2018 11. Olin replaced both hips, left - 01/20/2012 she had a right knee replacement by Dr. Maureen Ralphs in 31 May 2018 using a spinal.  Social History: Married, husband Kristen Escobar Her daughter works for Dr. Onnie Graham She knows [name]. They know [name].  [name] family history was non contributory.  DATA REVIEWED, COUNSELING AND COORDINATION OF CARE: I have personally seen and evaluated the patient, evaluated laboratory and imaging results, formulated the assessment and plan and placed orders. This requires moderate/high medical decision making. Total time spent with patient and charting: 20 minutes   Allergies Emeline Gins, CMA; 08/03/2019 9:21 AM) No Known Drug Allergies  [05/18/2019]: Allergies Reconciled  Medication History Emeline Gins, CMA; 08/03/2019 9:21 AM) Furosemide (20MG  Tablet, Oral) Active. Levothyroxine Sodium (50MCG Tablet, Oral) Active. Omeprazole (20MG  Capsule DR, Oral) Active. Medications Reconciled  Vitals Emeline Gins CMA; 08/03/2019  9:21 AM) 08/03/2019 9:21 AM Weight: 188 lb Height: 62in Body Surface Area: 1.86 m Body Mass Index: 34.39 kg/m  Temp.: 97.54F  Pulse: 88 (Regular)  BP: 126/82(Sitting, Left Arm, Standard)   Physical Exam  General: Obese WF who is alert and generally healthy appearing. She wearing a mask Skin: Inspection and palpation of the skin unremarkable.  Eyes: Conjunctivae white, pupils equal. Face, ears, nose, mouth, and throat: Face - She is wearing a mask.  Neck: Supple. No mass. No thyroid mass.  She has a scar to the left of midline in her neck where she had the fistula repaired. Lymph Nodes: No supraclavicular or cervical adenopathy. No axillary adenopathy.  Lungs: Normal respiratory effort. Clear to auscultation and symmetric breath sounds. Cardiovascular: Regular rate and rhythm. Normal auscultation of the heart. No murmur or rub.  Abdomen: Soft. No mass. Liver and spleen not palpable. No tenderness. No hernia. Normal bowel sounds.  She is moderately obese. She has a gastrostomy tube site with draining sinuses that surround this. The draining sinuses cover an area of 6 x 13 cm. The area around the chronic drain looks better than when I last saw her. [photo in Epic - the hemostat is in the gastrocutaneous fistula, the sinuses are primarily to the left of this - 05/18/2019] Rectal: Not done.  Musculoskeletal/extremities: Normal gait. Good strength and ROM in upper and lower extremities.   Neurologic: Grossly intact to motor and sensory function.   Psychiatric: Has normal mood and affect. Judgement and insight appear normal.  Assessment & Plan  1.  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 Scheduled for laparoscopic closure of gastro-cutaneous fistula on 09/09/2019  2.  HISTORY OF LYMPHOMA (Z85.79)  History of diffuse B cell lymphoma diagnosed in September 2018  Seen by Dr.  Lindi Adie 3.  ESOPHAGEAL STRICTURE (K22.2) 4. Got Covid vaccine 05/08/2019 5. Hypothyroid 6. GERD 7. History of right breast cancer - 2011 Dr. Zenia Resides and Humphrey Rolls 8. History of TE fistula Repaired by Dr. Carol Ada at [facility] - 06/11/2017 9. Finger amputation - 1995 10. Gall stones  Alphonsa Overall, MD, [facility] Surgery Office phone:  [phone]

## 2019-09-09 ENCOUNTER — Inpatient Hospital Stay (HOSPITAL_COMMUNITY)
Admission: RE | Admit: 2019-09-09 | Discharge: 2019-09-10 | DRG: 358 | Disposition: A | Discharge status: Home / Self Care | Payer: Medicare Other | Source: Ambulatory Visit | Attending: Surgery | Admitting: Surgery

## 2019-09-09 ENCOUNTER — Inpatient Hospital Stay (HOSPITAL_COMMUNITY): Payer: Medicare Other | Admitting: Anesthesiology

## 2019-09-09 ENCOUNTER — Encounter (HOSPITAL_COMMUNITY)
Admission: RE | Disposition: A | Discharge status: Home / Self Care | Payer: Self-pay | Source: Ambulatory Visit | Attending: Surgery

## 2019-09-09 ENCOUNTER — Inpatient Hospital Stay (HOSPITAL_COMMUNITY): Payer: Medicare Other | Admitting: Physician Assistant

## 2019-09-09 ENCOUNTER — Other Ambulatory Visit: Payer: Self-pay

## 2019-09-09 ENCOUNTER — Encounter (HOSPITAL_COMMUNITY): Payer: Self-pay | Admitting: Surgery

## 2019-09-09 DIAGNOSIS — Z96643 Presence of artificial hip joint, bilateral: Secondary | ICD-10-CM | POA: Diagnosis present

## 2019-09-09 DIAGNOSIS — I739 Peripheral vascular disease, unspecified: Secondary | ICD-10-CM | POA: Diagnosis present

## 2019-09-09 DIAGNOSIS — Z20822 Contact with and (suspected) exposure to covid-19: Secondary | ICD-10-CM | POA: Diagnosis present

## 2019-09-09 DIAGNOSIS — Z96651 Presence of right artificial knee joint: Secondary | ICD-10-CM | POA: Diagnosis present

## 2019-09-09 DIAGNOSIS — K219 Gastro-esophageal reflux disease without esophagitis: Secondary | ICD-10-CM | POA: Diagnosis present

## 2019-09-09 DIAGNOSIS — E039 Hypothyroidism, unspecified: Secondary | ICD-10-CM | POA: Diagnosis present

## 2019-09-09 DIAGNOSIS — Z8249 Family history of ischemic heart disease and other diseases of the circulatory system: Secondary | ICD-10-CM

## 2019-09-09 DIAGNOSIS — Z803 Family history of malignant neoplasm of breast: Secondary | ICD-10-CM | POA: Diagnosis not present

## 2019-09-09 DIAGNOSIS — E669 Obesity, unspecified: Secondary | ICD-10-CM | POA: Diagnosis present

## 2019-09-09 DIAGNOSIS — Z87891 Personal history of nicotine dependence: Secondary | ICD-10-CM

## 2019-09-09 DIAGNOSIS — Z853 Personal history of malignant neoplasm of breast: Secondary | ICD-10-CM

## 2019-09-09 DIAGNOSIS — I10 Essential (primary) hypertension: Secondary | ICD-10-CM | POA: Diagnosis present

## 2019-09-09 DIAGNOSIS — Z923 Personal history of irradiation: Secondary | ICD-10-CM | POA: Diagnosis not present

## 2019-09-09 DIAGNOSIS — I129 Hypertensive chronic kidney disease with stage 1 through stage 4 chronic kidney disease, or unspecified chronic kidney disease: Secondary | ICD-10-CM | POA: Diagnosis not present

## 2019-09-09 DIAGNOSIS — Z833 Family history of diabetes mellitus: Secondary | ICD-10-CM | POA: Diagnosis not present

## 2019-09-09 DIAGNOSIS — Z6834 Body mass index (BMI) 34.0-34.9, adult: Secondary | ICD-10-CM | POA: Diagnosis not present

## 2019-09-09 DIAGNOSIS — N189 Chronic kidney disease, unspecified: Secondary | ICD-10-CM | POA: Diagnosis not present

## 2019-09-09 DIAGNOSIS — K316 Fistula of stomach and duodenum: Secondary | ICD-10-CM | POA: Diagnosis present

## 2019-09-09 DIAGNOSIS — Z809 Family history of malignant neoplasm, unspecified: Secondary | ICD-10-CM | POA: Diagnosis not present

## 2019-09-09 DIAGNOSIS — L989 Disorder of the skin and subcutaneous tissue, unspecified: Secondary | ICD-10-CM | POA: Diagnosis not present

## 2019-09-09 HISTORY — PX: LAPAROSCOPIC GASTRIC RESECTION: SHX1936

## 2019-09-09 SURGERY — LAPAROSCOPIC GASTRIC RESECTION
Anesthesia: General | Site: Abdomen

## 2019-09-09 MED ORDER — LIDOCAINE 2% (20 MG/ML) 5 ML SYRINGE
INTRAMUSCULAR | Status: DC | PRN
  Administered 2019-09-09: 1.5 mg/kg/h via INTRAVENOUS

## 2019-09-09 MED ORDER — MIDAZOLAM HCL 2 MG/2ML IJ SOLN
INTRAMUSCULAR | Status: AC
  Filled 2019-09-09: qty 2

## 2019-09-09 MED ORDER — LIDOCAINE 2% (20 MG/ML) 5 ML SYRINGE
INTRAMUSCULAR | Status: AC
  Filled 2019-09-09: qty 5

## 2019-09-09 MED ORDER — SUCCINYLCHOLINE CHLORIDE 200 MG/10ML IV SOSY
PREFILLED_SYRINGE | INTRAVENOUS | Status: AC
  Filled 2019-09-09: qty 10

## 2019-09-09 MED ORDER — PROPOFOL 10 MG/ML IV BOLUS
INTRAVENOUS | Status: DC | PRN
  Administered 2019-09-09: 140 mg via INTRAVENOUS

## 2019-09-09 MED ORDER — EPHEDRINE SULFATE-NACL 50-0.9 MG/10ML-% IV SOSY
PREFILLED_SYRINGE | INTRAVENOUS | Status: DC | PRN
  Administered 2019-09-09 (×2): 10 mg via INTRAVENOUS

## 2019-09-09 MED ORDER — 0.9 % SODIUM CHLORIDE (POUR BTL) OPTIME
TOPICAL | Status: DC | PRN
  Administered 2019-09-09: 1000 mL

## 2019-09-09 MED ORDER — FENTANYL CITRATE (PF) 100 MCG/2ML IJ SOLN
INTRAMUSCULAR | Status: DC | PRN
  Administered 2019-09-09 (×7): 50 ug via INTRAVENOUS

## 2019-09-09 MED ORDER — ACETAMINOPHEN 10 MG/ML IV SOLN
INTRAVENOUS | Status: DC | PRN
  Administered 2019-09-09: 1000 mg via INTRAVENOUS

## 2019-09-09 MED ORDER — HYDROMORPHONE HCL 1 MG/ML IJ SOLN: 0.2500 mg | INTRAMUSCULAR | Status: DC | PRN

## 2019-09-09 MED ORDER — ROCURONIUM BROMIDE 10 MG/ML (PF) SYRINGE
PREFILLED_SYRINGE | INTRAVENOUS | Status: AC
  Filled 2019-09-09: qty 10

## 2019-09-09 MED ORDER — CHLORHEXIDINE GLUCONATE 4 % EX LIQD: 60.0000 mL | Freq: Once | CUTANEOUS | Status: DC

## 2019-09-09 MED ORDER — KCL IN DEXTROSE-NACL 20-5-0.45 MEQ/L-%-% IV SOLN
INTRAVENOUS | Status: DC
  Filled 2019-09-09: qty 1000

## 2019-09-09 MED ORDER — ONDANSETRON HCL 4 MG/2ML IJ SOLN
INTRAMUSCULAR | Status: AC
  Filled 2019-09-09: qty 2

## 2019-09-09 MED ORDER — FUROSEMIDE 20 MG PO TABS
20.0000 mg | ORAL_TABLET | Freq: Every day | ORAL | Status: DC
  Administered 2019-09-10: 20 mg via ORAL
  Filled 2019-09-09: qty 1

## 2019-09-09 MED ORDER — FENTANYL CITRATE (PF) 100 MCG/2ML IJ SOLN
INTRAMUSCULAR | Status: AC
  Filled 2019-09-09: qty 2

## 2019-09-09 MED ORDER — ACETAMINOPHEN 500 MG PO TABS
1000.0000 mg | ORAL_TABLET | ORAL | Status: DC
  Filled 2019-09-09: qty 2

## 2019-09-09 MED ORDER — HEPARIN SODIUM (PORCINE) 5000 UNIT/ML IJ SOLN
5000.0000 [IU] | Freq: Three times a day (TID) | INTRAMUSCULAR | Status: DC
  Administered 2019-09-09 – 2019-09-10 (×3): 5000 [IU] via SUBCUTANEOUS
  Filled 2019-09-09 (×3): qty 1

## 2019-09-09 MED ORDER — DEXAMETHASONE SODIUM PHOSPHATE 10 MG/ML IJ SOLN
INTRAMUSCULAR | Status: DC | PRN
  Administered 2019-09-09: 5 mg via INTRAVENOUS

## 2019-09-09 MED ORDER — BUPIVACAINE LIPOSOME 1.3 % IJ SUSP
20.0000 mL | Freq: Once | INTRAMUSCULAR | Status: AC
  Administered 2019-09-09: 20 mL
  Filled 2019-09-09: qty 20

## 2019-09-09 MED ORDER — SUCCINYLCHOLINE CHLORIDE 200 MG/10ML IV SOSY
PREFILLED_SYRINGE | INTRAVENOUS | Status: DC | PRN
  Administered 2019-09-09: 120 mg via INTRAVENOUS

## 2019-09-09 MED ORDER — LEVOTHYROXINE SODIUM 50 MCG PO TABS
50.0000 ug | ORAL_TABLET | Freq: Every day | ORAL | Status: DC
  Administered 2019-09-10: 50 ug via ORAL
  Filled 2019-09-09: qty 1

## 2019-09-09 MED ORDER — BUPIVACAINE-EPINEPHRINE 0.25% -1:200000 IJ SOLN
INTRAMUSCULAR | Status: AC
  Filled 2019-09-09: qty 1

## 2019-09-09 MED ORDER — HEPARIN SODIUM (PORCINE) 5000 UNIT/ML IJ SOLN
5000.0000 [IU] | Freq: Once | INTRAMUSCULAR | Status: AC
  Administered 2019-09-09: 5000 [IU] via SUBCUTANEOUS
  Filled 2019-09-09: qty 1

## 2019-09-09 MED ORDER — ONDANSETRON 4 MG PO TBDP: 4.0000 mg | ORAL_TABLET | Freq: Four times a day (QID) | ORAL | Status: DC | PRN

## 2019-09-09 MED ORDER — ACETAMINOPHEN 10 MG/ML IV SOLN
INTRAVENOUS | Status: AC
  Filled 2019-09-09: qty 100

## 2019-09-09 MED ORDER — MORPHINE SULFATE (PF) 2 MG/ML IV SOLN: 1.0000 mg | INTRAVENOUS | Status: DC | PRN

## 2019-09-09 MED ORDER — ONDANSETRON HCL 4 MG/2ML IJ SOLN: 4.0000 mg | Freq: Four times a day (QID) | INTRAMUSCULAR | Status: DC | PRN

## 2019-09-09 MED ORDER — DEXAMETHASONE SODIUM PHOSPHATE 10 MG/ML IJ SOLN
INTRAMUSCULAR | Status: AC
  Filled 2019-09-09: qty 1

## 2019-09-09 MED ORDER — SODIUM CHLORIDE 0.9 % IV SOLN
2.0000 g | INTRAVENOUS | Status: AC
  Administered 2019-09-09: 2 g via INTRAVENOUS
  Filled 2019-09-09: qty 2

## 2019-09-09 MED ORDER — PANTOPRAZOLE SODIUM 40 MG PO TBEC
40.0000 mg | DELAYED_RELEASE_TABLET | Freq: Every day | ORAL | Status: DC
  Administered 2019-09-10: 40 mg via ORAL
  Filled 2019-09-09: qty 1

## 2019-09-09 MED ORDER — ACETAMINOPHEN 160 MG/5ML PO SOLN: 500.0000 mg | Freq: Four times a day (QID) | ORAL | Status: DC | PRN

## 2019-09-09 MED ORDER — PROPOFOL 10 MG/ML IV BOLUS
INTRAVENOUS | Status: AC
  Filled 2019-09-09: qty 20

## 2019-09-09 MED ORDER — SUGAMMADEX SODIUM 500 MG/5ML IV SOLN
INTRAVENOUS | Status: DC | PRN
  Administered 2019-09-09: 320 mg via INTRAVENOUS

## 2019-09-09 MED ORDER — BUPIVACAINE-EPINEPHRINE 0.25% -1:200000 IJ SOLN
INTRAMUSCULAR | Status: DC | PRN
  Administered 2019-09-09: 30 mL

## 2019-09-09 MED ORDER — LACTATED RINGERS IR SOLN
Status: DC | PRN
  Administered 2019-09-09: 1000 mL

## 2019-09-09 MED ORDER — LACTATED RINGERS IV SOLN: INTRAVENOUS | Status: DC

## 2019-09-09 MED ORDER — ROCURONIUM BROMIDE 10 MG/ML (PF) SYRINGE
PREFILLED_SYRINGE | INTRAVENOUS | Status: DC | PRN
  Administered 2019-09-09: 10 mg via INTRAVENOUS
  Administered 2019-09-09: 50 mg via INTRAVENOUS

## 2019-09-09 MED ORDER — TRAMADOL HCL 50 MG PO TABS: 50.0000 mg | ORAL_TABLET | Freq: Four times a day (QID) | ORAL | Status: DC | PRN

## 2019-09-09 MED ORDER — OXYCODONE HCL 5 MG/5ML PO SOLN
5.0000 mg | ORAL | Status: DC | PRN
  Administered 2019-09-09 – 2019-09-10 (×4): 5 mg via ORAL
  Filled 2019-09-09 (×4): qty 5

## 2019-09-09 MED ORDER — ONDANSETRON HCL 4 MG/2ML IJ SOLN
INTRAMUSCULAR | Status: DC | PRN
  Administered 2019-09-09: 4 mg via INTRAVENOUS

## 2019-09-09 MED ORDER — LIDOCAINE 2% (20 MG/ML) 5 ML SYRINGE
INTRAMUSCULAR | Status: DC | PRN
  Administered 2019-09-09: 60 mg via INTRAVENOUS

## 2019-09-09 MED ORDER — SUGAMMADEX SODIUM 500 MG/5ML IV SOLN
INTRAVENOUS | Status: AC
  Filled 2019-09-09: qty 5

## 2019-09-09 SURGICAL SUPPLY — 57 items
APPLIER CLIP ROT 10 11.4 M/L (STAPLE)
BLADE HEX COATED 2.75 (ELECTRODE) IMPLANT
CLIP APPLIE ROT 10 11.4 M/L (STAPLE) IMPLANT
COVER MAYO STAND STRL (DRAPES) IMPLANT
COVER SURGICAL LIGHT HANDLE (MISCELLANEOUS) ×3 IMPLANT
COVER WAND RF STERILE (DRAPES) IMPLANT
DERMABOND ADVANCED (GAUZE/BANDAGES/DRESSINGS)
DERMABOND ADVANCED .7 DNX12 (GAUZE/BANDAGES/DRESSINGS) IMPLANT
DRAPE LAPAROSCOPIC ABDOMINAL (DRAPES) ×3 IMPLANT
DRAPE WARM FLUID 44X44 (DRAPES) IMPLANT
DRSG PAD ABDOMINAL 8X10 ST (GAUZE/BANDAGES/DRESSINGS) ×3 IMPLANT
ELECT REM PT RETURN 15FT ADLT (MISCELLANEOUS) ×3 IMPLANT
ENDOLOOP SUT PDS II  0 18 (SUTURE)
ENDOLOOP SUT PDS II 0 18 (SUTURE) IMPLANT
GAUZE SPONGE 4X4 12PLY STRL (GAUZE/BANDAGES/DRESSINGS) ×3 IMPLANT
GLOVE BIO SURGEON STRL SZ7 (GLOVE) ×3 IMPLANT
GLOVE BIOGEL PI IND STRL 7.0 (GLOVE) ×1 IMPLANT
GLOVE BIOGEL PI IND STRL 7.5 (GLOVE) ×1 IMPLANT
GLOVE BIOGEL PI INDICATOR 7.0 (GLOVE) ×2
GLOVE BIOGEL PI INDICATOR 7.5 (GLOVE) ×2
GOWN SPEC L4 XLG W/TWL (GOWN DISPOSABLE) ×3 IMPLANT
KIT BASIN (CUSTOM PROCEDURE TRAY) ×3 IMPLANT
KIT TURNOVER KIT A (KITS) IMPLANT
MARKER SKIN DUAL TIP RULER LAB (MISCELLANEOUS) IMPLANT
NS IRRIG 1000ML POUR BTL (IV SOLUTION) ×3 IMPLANT
PENCIL SMOKE EVACUATOR (MISCELLANEOUS) IMPLANT
POUCH SPECIMEN RETRIEVAL 10MM (ENDOMECHANICALS) IMPLANT
RELOAD STAPLER GOLD 60MM (STAPLE) ×1 IMPLANT
SCISSORS LAP 5X35 DISP (ENDOMECHANICALS) ×3 IMPLANT
SET IRRIG TUBING LAPAROSCOPIC (IRRIGATION / IRRIGATOR) ×3 IMPLANT
SHEARS HARMONIC ACE PLUS 36CM (ENDOMECHANICALS) ×3 IMPLANT
SLEEVE XCEL OPT CAN 5 100 (ENDOMECHANICALS) ×3 IMPLANT
SPONGE LAP 18X18 RF (DISPOSABLE) ×3 IMPLANT
STAPLE ECHEON FLEX 60 POW ENDO (STAPLE) ×3 IMPLANT
STAPLER 90 3.5 STAND SLIM (STAPLE)
STAPLER 90 3.5 STD SLIM (STAPLE) IMPLANT
STAPLER RELOAD GOLD 60MM (STAPLE) ×3
STAPLER VISISTAT 35W (STAPLE) ×3 IMPLANT
SUT MNCRL AB 4-0 PS2 18 (SUTURE) ×3 IMPLANT
SUT PDS AB 1 CT1 27 (SUTURE) ×6 IMPLANT
SUT SILK 2 0 (SUTURE) ×3
SUT SILK 2 0 SH CR/8 (SUTURE) IMPLANT
SUT SILK 2-0 18XBRD TIE 12 (SUTURE) ×1 IMPLANT
SUT SILK 3 0 (SUTURE)
SUT SILK 3 0 SH CR/8 (SUTURE) IMPLANT
SUT SILK 3-0 18XBRD TIE 12 (SUTURE) IMPLANT
SUT VIC AB 3-0 SH 18 (SUTURE) ×3 IMPLANT
TIP INNERVISION DETACH 40FR (MISCELLANEOUS) IMPLANT
TIP INNERVISION DETACH 50FR (MISCELLANEOUS) IMPLANT
TIP INNERVISION DETACH 56FR (MISCELLANEOUS) IMPLANT
TIPS INNERVISION DETACH 40FR (MISCELLANEOUS)
TOWEL OR 17X26 10 PK STRL BLUE (TOWEL DISPOSABLE) ×6 IMPLANT
TRAY LAPAROSCOPIC (CUSTOM PROCEDURE TRAY) ×3 IMPLANT
TROCAR BLADELESS OPT 5 100 (ENDOMECHANICALS) ×3 IMPLANT
TROCAR XCEL 12X100 BLDLESS (ENDOMECHANICALS) ×3 IMPLANT
TROCAR XCEL UNIV SLVE 11M 100M (ENDOMECHANICALS) IMPLANT
YANKAUER SUCT BULB TIP 10FT TU (MISCELLANEOUS) ×3 IMPLANT

## 2019-09-09 NOTE — Anesthesia Postprocedure Evaluation (Signed)
Anesthesia Post Note  Patient: Kristen Escobar  Procedure(s) Performed: LAPAROSCOPIC CLOSURE OF GASTRO-CUTANEOUS FISTULA, EXCISION OF FISTUAL TRACK (N/A Abdomen)     Patient location during evaluation: PACU Anesthesia Type: General Level of consciousness: awake Pain management: pain level controlled Vital Signs Assessment: post-procedure vital signs reviewed and stable Respiratory status: spontaneous breathing Postop Assessment: no apparent nausea or vomiting Anesthetic complications: no    Last Vitals:  Vitals:   09/09/19 0945 09/09/19 1000  BP: (!) 168/57   Pulse: 65 65  Resp: 15 15  Temp:  (!) 36.4 C  SpO2: 94% 95%    Last Pain:  Vitals:   09/09/19 1000  TempSrc:   PainSc: 0-No pain                 Shay Bartoli

## 2019-09-09 NOTE — Transfer of Care (Signed)
Immediate Anesthesia Transfer of Care Note  Patient: Kristen Escobar  Procedure(s) Performed: LAPAROSCOPIC CLOSURE OF GASTRO-CUTANEOUS FISTULA, EXCISION OF FISTUAL TRACK (N/A Abdomen)  Patient Location: PACU  Anesthesia Type:General  Level of Consciousness: awake, alert , oriented and patient cooperative  Airway & Oxygen Therapy: Patient Spontanous Breathing and Patient connected to face mask oxygen  Post-op Assessment: Report given to RN and Post -op Vital signs reviewed and stable  Post vital signs: Reviewed and stable  Last Vitals:  Vitals Value Taken Time  BP    Temp    Pulse 71 09/09/19 0902  Resp 15 09/09/19 0902  SpO2 98 % 09/09/19 0902  Vitals shown include unvalidated device data.  Last Pain:  Vitals:   09/09/19 0615  TempSrc: Oral         Complications: No apparent anesthesia complications

## 2019-09-09 NOTE — Interval H&P Note (Signed)
History and Physical Interval Note:  09/09/2019 7:22 AM  Kristen Escobar  has presented today for surgery, with the diagnosis of GASTROCUTANEOUS FISTULA.  The various methods of treatment have been discussed with the patient and family.   Husband on phone.  After consideration of risks, benefits and other options for treatment, the patient has consented to  Procedure(s): LAPAROSCOPIC CLOSURE OF GASTRO-CUTANEOUS FISTULA (N/A) as a surgical intervention.  [name] history has been reviewed, patient examined, no change in status, stable for surgery.  I have reviewed the patient's chart and labs.  Questions were answered to the patient's satisfaction.     Shann Medal

## 2019-09-09 NOTE — Anesthesia Procedure Notes (Signed)
Procedure Name: Intubation Performed by: West Pugh, CRNA Pre-anesthesia Checklist: Patient identified, Emergency Drugs available, Suction available, Patient being monitored and Timeout performed Patient Re-evaluated:Patient Re-evaluated prior to induction Oxygen Delivery Method: Circle system utilized Preoxygenation: Pre-oxygenation with 100% oxygen Induction Type: IV induction Ventilation: Mask ventilation without difficulty Laryngoscope Size: Mac and 3 Grade View: Grade I Tube type: Oral Tube size: 7.0 mm Number of attempts: 1 Airway Equipment and Method: Stylet Placement Confirmation: ETT inserted through vocal cords under direct vision,  positive ETCO2,  CO2 detector and breath sounds checked- equal and bilateral Secured at: 22 cm Tube secured with: Tape Dental Injury: Teeth and Oropharynx as per pre-operative assessment  Comments: AOI

## 2019-09-09 NOTE — Op Note (Addendum)
09/09/2019  8:41 AM  PATIENT:  Kristen Escobar, 74 y.o., female, MRN: [id number]  PREOP DIAGNOSIS:  GASTROCUTANEOUS FISTULA  POSTOP DIAGNOSIS:   Gastrocutaneous fistula  PROCEDURE:   Procedure(s):   LAPAROSCOPIC CLOSURE OF GASTRO-CUTANEOUS FISTULA, EXCISION OF FISTUAL TRACK  SURGEON:   Alphonsa Overall, M.D.  ASSISTANTClent Demark, M.D.  ANESTHESIA:   general  Anesthesiologist: Belinda Block, MD CRNA: West Pugh, CRNA  General  EBL:  <50  ml  BLOOD ADMINISTERED: none  DRAINS: none   LOCAL MEDICATIONS USED:   30 cc of 1/4% marcaine + 20 cc of Exparel  SPECIMEN:   Fistula tract  COUNTS CORRECT:  YES  INDICATIONS FOR PROCEDURE:  Kristen Escobar is a 74 y.o. (DOB: [birth year]) white female whose primary care physician is Eulas Post, MD and comes for laparoscopic repair of gastrocutaneous fistula   The indications and risks of the surgery were explained to the patient.  The risks include, but are not limited to, infection, bleeding, and nerve injury.  PROCEDURE: The patient was taken to room #1 and underwent a general anesthetic.  Both her arms were tucked.  Her abdomen is prepped with ChloraPrep and Betadine.  A timeout was held and surgical checklist 1.  I accessed her abdominal cavity with a 5 mm trocar in the right upper quadrant.  I placed an additional 12 mm trocar in the right lower quadrant and a 5 mm trocar in the left lower quadrant.  Laparoscopic exploration revealed a normal liver, gallbladder, and bowel that I could see.  The primary finding was a attachment of the mid stomach to the anterior wall which represented the gastro cutaneous fistula.  I took down some of the adhesions around this fistula.  I then fired the Echelon 60 mm gold load across the stomach just below the fistula.  The staple line looked good.  I then excised the abdominal wall fistula tract cutting around the tract to the anterior abdominal wall.  She has evidence of multiple infections  of the anterior abdominal wall.  But I only removed the main fistula tract.  I expect the others to dry up without a persistent infection.  The fistula tract went through the left rectus muscle.  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  I then closed the abdominal fascia with interrupted #1 PDS sutures.  I then relaparoscoped the patient and the abdominal wound closure looked good.  The stomach staple line looked good.  The trochars were then removed.  I infiltrated all the wounds with a mixture of quarter percent Marcaine and Exparel.  I closed the skin at the trocar sites with 4-0 Monocryl suture.  I packed the gastrocutaneous fistula wound with saline gauze.  I have a surgeon as a first assist to retract, expose, and assist on this difficult operation.  She tolerated the procedure well was transferred covering in good condition.       Alphonsa Overall, MD, [facility] Surgery Office phone:  [phone]

## 2019-09-10 MED ORDER — FLUCONAZOLE 150 MG PO TABS
150.0000 mg | ORAL_TABLET | Freq: Once | ORAL | Status: AC
  Administered 2019-09-10: 150 mg via ORAL
  Filled 2019-09-10: qty 1

## 2019-09-10 MED ORDER — TRAMADOL HCL 50 MG PO TABS: 50.0000 mg | ORAL_TABLET | Freq: Four times a day (QID) | ORAL | 0 refills | Status: DC | PRN

## 2019-09-10 NOTE — Plan of Care (Signed)

## 2019-09-10 NOTE — Discharge Instructions (Signed)
[facility] [place] [facility] - DISCHARGE INSTRUCTIONS TO PATIENT  Activity:  Driving - May drive in 2 to 4 days, when off pain meds   Lifting - No lifting more than 15 pounds for 10 days, then no limit                       Practice your Covid-19 protection:  Wear a mask, social distance, and wash your hands frequently  Wound Care:   Change abdominal packing twice a day.  Shower once a day with packing out.  Diet:  As tolerated  Follow up appointment:  Call Dr. Pollie Friar office [facility]) at [phone] for an appointment in 2 weeks..  Medications and dosages:  Resume your home medications.  You have a prescription for:  Vicodin  Call Dr. Lucia Gaskins or his office  [phone]) if you have:  Temperature greater than 100.4,  Persistent nausea and vomiting,  Severe uncontrolled pain,  Redness, tenderness, or signs of infection (pain, swelling, redness, odor or green/yellow discharge around the site),  Difficulty breathing, headache or visual disturbances,  Any other questions or concerns you may have after discharge.  In an emergency, call 911 or go to an Emergency Department at a nearby hospital.   [place]  It is important that the wound be kept open.   -Keeping the skin edges apart will allow the wound to gradually heal from the base upwards.   - If the skin edges of the wound close too early, a new fluid pocket can form and infection can occur. -This is the reason to pack deeper wounds with gauze or ribbon -This is why drained wounds cannot be sewed closed right away  A healthy wound should form a lining of bright red "beefy" granulating tissue that will help shrink the wound and help the edges grow new skin into it.   -A little mucus / yellow discharge is normal (the body's natural way to try and form a scab) and should be gently washed off with soap and water with daily dressing changes.  -Green or foul smelling drainage implies bacterial colonization and can  slow wound healing - a short course of antibiotic ointment (3-5 days) can help it clear up.  Call the doctor if it does not improve or worsens  -Avoid use of antibiotic ointments for more than a week as they can slow wound healing over time.    -Sometimes other wound care products will be used to reduce need for dressing changes and/or help clean up dirty wounds -Sometimes the surgeon needs to debride the wound in the office to remove dead or infected tissue out of the wound so it can heal more quickly and safely.    Change the dressing at least once a day -Wash the wound with mild soap and water gently every day.  It is good to shower or bathe the wound to help it clean out. -Use clean 4x4 gauze for medium/large wounds or ribbon plain NU-gauze for smaller wounds (it does not need to be sterile, just clean) -Keep the raw wound moist with a little saline or KY (saline) gel on the gauze.  -A dry wound will take longer to heal.  -Keep the skin dry around the wound to prevent breakdown and irritation. -Pack the wound down to the base -The goal is to keep the skin apart, not overpack the wound -Use a Q-tip or blunt-tipped kabob stick toothpick to push the gauze down to the base  in narrow or deep wounds   -Cover with a clean gauze and tape -paper or Medipore tape tend to be gentle on the skin -rotate the orientation of the tape to avoid repeated stress/trauma on the skin -using an ACE or Coban wrap on wounds on arms or legs can be used instead.   Returning the see the surgeon is helpful to follow the healing process and help the wound close as fast as possible.

## 2019-09-10 NOTE — Discharge Summary (Signed)
Physician Discharge Summary    Patient ID: Kristen Escobar MRN: [id number] DOB/AGE: [birth year]  74 y.o.  Patient Care Team: Eulas Post, MD as PCP - General (Family Medicine) Marcy Panning, MD as Consulting Physician (Internal Medicine) Thea Silversmith, MD as Consulting Physician (Radiation Oncology) Nicholas Lose, MD as Consulting Physician (Hematology and Oncology) Gaynelle Arabian, MD as Consulting Physician (Orthopedic Surgery) Danella Sensing, MD as Consulting Physician (Dermatology) Melissa Noon, [place] as Consulting Physician (Optometry) Ernestine Conrad, MD as Consulting Physician (Otolaryngology)  Admit date: 09/09/2019  Discharge date: 09/10/2019  Hospital Stay = 1 days    Discharge Diagnoses:  Active Problems:   Gastrocutaneous fistula   1 Day Post-Op  09/09/2019  POST-OPERATIVE DIAGNOSIS:   GASTROCUTANEOUS FISTULA  SURGERY:  09/09/2019  Procedure(s): LAPAROSCOPIC CLOSURE OF GASTRO-CUTANEOUS FISTULA, EXCISION OF FISTUAL TRACK  SURGEON:    Surgeon(s): Alphonsa Overall, MD Armandina Gemma, MD  Consults: None  Hospital Course:   The patient underwent the surgery above.  Postoperatively, the patient gradually mobilized and advanced to a solid diet.  Pain and other symptoms were treated aggressively.    By the time of discharge, the patient was walking well the hallways, eating food, having flatus.  Pain was well-controlled on an oral medications.  Based on meeting discharge criteria and continuing to recover, I felt it was safe for the patient to be discharged from the hospital to further recover with close followup.  Patient husband has been managing wound and had discussed with Dr. Lucia Gaskins on plans of care.  Saline dressings at least once if not twice a day at first.  I wrote for 1 dose of fluconazole given concern of yeast like rash.  Postoperative recommendations were discussed in detail.  They are written as well.  Discharged Condition: good  Discharge  Exam: Blood pressure (!) 144/62, pulse (!) 59, temperature 97.7 F (36.5 C), temperature source Oral, resp. rate 16, height 5\' 2"  (1.575 m), weight 85.4 kg, SpO2 94 %.  General: Pt awake/alert/oriented x4 in No acute distress Eyes: PERRL, normal EOM.  Sclera clear.  No icterus Neuro: CN II-XII intact w/o focal sensory/motor deficits. Lymph: No head/neck/groin lymphadenopathy Psych:  No delerium/psychosis/paranoia HENT: Normocephalic, Mucus membranes moist.  No thrush Neck: Supple, No tracheal deviation Chest: No chest wall pain w good excursion CV:  Pulses intact.  Regular rhythm MS: Normal AROM mjr joints.  No obvious deformity Abdomen: Soft.  Nondistended.  Mildly tender at incisions only left upper quadrant dressing with old blood.  No active bleeding.  Wound clean.  Moderate skin/yeast rash..  No evidence of peritonitis.  No incarcerated hernias. Ext:  SCDs BLE.  No mjr edema.  No cyanosis Skin: No petechiae / purpura   Disposition:   Follow-up Information    Alphonsa Overall, MD. Schedule an appointment as soon as possible for a visit in 2 week(s).   Specialty: General Surgery Why: Call the office Monday morning to set up appointment to see Dr. Lucia Gaskins at about 2 weeks.  Make sure that the wound at the gastrocutaneous fistula site is healing/closing down Contact information: [address] [place] [id number] [phone]           Discharge disposition: 01-Home or Self Care       Discharge Instructions    Call MD for:   Complete by: As directed    FEVER > 101.5 F (Temperatures <101.10F can occasionally happen and are not significant)   Call MD for:  extreme fatigue  Complete by: As directed    Call MD for:  persistant dizziness or light-headedness   Complete by: As directed    Call MD for:  persistant nausea and vomiting   Complete by: As directed    Call MD for:  redness, tenderness, or signs of infection (pain, swelling, redness, odor or  green/yellow discharge around incision site)   Complete by: As directed    Call MD for:  severe uncontrolled pain   Complete by: As directed    Diet - low sodium heart healthy   Complete by: As directed    Follow a light diet the first few days at home.    If you feel full, bloated, or constipated, stay on a liquid diet until you feel better and not constipated. Gradually get back to a solid diet.  Avoid fast food or heavy meals the first week as you are more likely to get nauseated. It is expected for your digestive tract to need a few months to get back to normal.   Discharge wound care:   Complete by: As directed    You have an open wound.  Do dressing care as instructed by Dr. Lucia Gaskins.  In general, it is encouraged that you remove your dressing and packing, shower with soap & water, and replace your dressing at least once a day.   Pack the wound with clean gauze moistened with normal (0.9%) saline or KY gel to keep the wound moist & uninfected.    .   Eventually your body will heal & pull the open wound closed over the next few months.  Raw open wounds will occasionally bleed or secrete yellow drainage until it heals closed.   Pressure on the dressing for 30 minutes will stop most wound bleeding  Follow-up closely with Dr. Lucia Gaskins to make sure the wound continues to heal.   Driving Restrictions   Complete by: As directed    You may drive when you are no longer taking narcotic prescription pain medication, you can comfortably wear a seatbelt, and you can safely make sudden turns/stops to protect yourself without hesitating due to pain.   Increase activity slowly   Complete by: As directed    Increase activity slowly   Complete by: As directed    Lifting restrictions   Complete by: As directed    Start light daily activities --- self-care, walking, climbing stairs- beginning the day after surgery.   Gradually increase activities as tolerated.   Control your pain to be active.     Stop when you are tired.   Ideally, walk several times a day, eventually an hour a day.   Most people are back to most day-to-day activities in a few weeks.  It takes 4-8 weeks to get back to unrestricted, intense activity. If you can walk 30 minutes without difficulty, it is safe to try more intense activity such as jogging, treadmill, bicycling, low-impact aerobics, swimming, etc. Save the most intensive and strenuous activity for last (Usually 4-8 weeks after surgery) such as sit-ups, heavy lifting, contact sports, etc.   Refrain from any intense heavy lifting or straining until you are off narcotics for pain control.  You will have off days, but things should improve week-by-week. DO NOT PUSH THROUGH PAIN.   Let pain be your guide: If it hurts to do something, don't do it.  Pain is your body warning you to avoid that activity for another week until the pain goes down.   May shower /  Bathe   Complete by: As directed    May walk up steps   Complete by: As directed    Sexual Activity Restrictions   Complete by: As directed    You may have sexual intercourse when it is comfortable. If it hurts to do something, stop.      Allergies as of 09/10/2019   No Known Allergies     Medication List    TAKE these medications   ADULT GUMMY PO Take 2 tablets by mouth daily. Gummy   furosemide 20 MG tablet Commonly known as: LASIX Take 1 tablet by mouth twice daily What changed:   when to take this  additional instructions   levothyroxine 50 MCG tablet Commonly known as: SYNTHROID Place 1 tablet (50 mcg total) into feeding tube daily. What changed:   how to take this  when to take this  additional instructions   loperamide 2 MG tablet Commonly known as: IMODIUM A-D Place 2 mg into feeding tube 4 (four) times daily as needed for diarrhea or loose stools.   omeprazole 20 MG capsule Commonly known as: PRILOSEC Take 20 mg by mouth daily. Mix with applesauce/yogurt   traMADol 50  MG tablet Commonly known as: ULTRAM Take 1 tablet (50 mg total) by mouth every 6 (six) hours as needed (mild pain).            Discharge Care Instructions  (From admission, onward)         Start     Ordered   09/10/19 0000  Discharge wound care:    Comments: You have an open wound.  Do dressing care as instructed by Dr. Lucia Gaskins.  In general, it is encouraged that you remove your dressing and packing, shower with soap & water, and replace your dressing at least once a day.   Pack the wound with clean gauze moistened with normal (0.9%) saline or KY gel to keep the wound moist & uninfected.    .   Eventually your body will heal & pull the open wound closed over the next few months.  Raw open wounds will occasionally bleed or secrete yellow drainage until it heals closed.   Pressure on the dressing for 30 minutes will stop most wound bleeding  Follow-up closely with Dr. Lucia Gaskins to make sure the wound continues to heal.   09/10/19 1042          Significant Diagnostic Studies:  No results found for this or any previous visit (from the past 72 hour(s)).  No results found.  Past Medical History:  Diagnosis Date  . Acute respiratory failure with hypoxia ([place]) 02/16/2017  . Arthritis   . Cancer ([place]) 03/2009   breast- rt  . Complication of anesthesia    Hard to void after knee replacement 05-2018  . 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 GERD (gastroesophageal reflux disease)   . History of radiation therapy 07/12/10,completed   right breast 60 Gy x30 fx  . Hypertension    no meds currently  . Hypothyroidism   . Obesity   . Peripheral vascular disease ([place]) 1995   PT DEVELOPED CIRCULATION PROBLEMS IN BOTH HANDS AND GANGRENE OF BOTH INDEX FINGERS--REQUIRING AMPUTATION OF THE INDEX FINGERS AND VASCULAR SURGERY.  PT TOLD HER PROBLEMS RELATED TO SMOKING.   NO OTHER PROBLEMS SINCE.  Marland Kitchen Personal history of radiation therapy  2012   Right Breast Cancer  .  Pneumonia   . Thyroid mass     Past Surgical History:  Procedure Laterality Date  . AMPUTATION     partial amputation of both index fingers  . BREAST LUMPECTOMY Right 04/01/2010  . BREAST SURGERY  2011   lumpectomy with node sampling- RIGHT  . CATARACT EXTRACTION, BILATERAL  approx 2017  . COLONOSCOPY    . ESOPHAGOGASTRODUODENOSCOPY    . EXCISION MASS NECK Left 12/26/2016   Procedure: EXCISION MASS NECK;  Surgeon: Izora Gala, MD;  Location: [facility];  Service: ENT;  Laterality: Left;  open excision of thyroid mass left side with frozen section  . HAND SURGERY Bilateral 1995   Amputaed pointer fingers bilaterally  . IR FLUORO GUIDE PORT INSERTION RIGHT  01/14/2017  . IR GASTROSTOMY TUBE MOD SED  02/19/2017  . IR GASTROSTOMY TUBE REMOVAL  12/07/2018  . IR PATIENT EVAL TECH 0-60 MINS  01/14/2019  . IR REMOVAL TUN ACCESS W/ PORT W/O FL MOD SED  11/30/2017  . IR REPLACE G-TUBE SIMPLE WO FLUORO  08/03/2017  . IR REPLACE G-TUBE SIMPLE WO FLUORO  06/10/2018  . IR REPLC GASTRO/COLONIC TUBE PERCUT W/FLUORO  02/25/2017  . IR [place] TUBE PERCUT W/FLUORO  03/09/2017  . IR US GUIDE BX ASP/DRAIN  12/07/2018  . IR US GUIDE VASC ACCESS RIGHT  01/14/2017  . JOINT REPLACEMENT    . LAPAROSCOPIC GASTRIC RESECTION N/A 09/09/2019   Procedure: LAPAROSCOPIC CLOSURE OF GASTRO-CUTANEOUS FISTULA, EXCISION OF FISTUAL TRACK;  Surgeon: Alphonsa Overall, MD;  Location: [facility];  Service: General;  Laterality: N/A;  . TOTAL HIP ARTHROPLASTY  12/16/2011   right hip  . TOTAL HIP ARTHROPLASTY  01/20/2012   Procedure: TOTAL HIP ARTHROPLASTY ANTERIOR APPROACH;  Surgeon: Mauri Pole, MD;  Location: [facility];  Service: Orthopedics;  Laterality: Left;  . TOTAL KNEE ARTHROPLASTY Right 05/31/2018   Procedure: TOTAL KNEE ARTHROPLASTY;  Surgeon: Gaynelle Arabian, MD;  Location: [facility];  Service: Orthopedics;  Laterality: Right;  Adductor Block  . TUBAL LIGATION    . VASCULAR SURGERY     both hands     Social History   Socioeconomic History  . Marital status: Married    Spouse name: Not on file  . Number of children: Not on file  . Years of education: Not on file  . Highest education level: Not on file  Occupational History  . Not on file  Tobacco Use  . Smoking status: Former Smoker    Packs/day: 1.00    Years: 20.00    Pack years: 20.00    Quit date: 04/21/1993    Years since quitting: 26.4  . Smokeless tobacco: Never Used  Substance and Sexual Activity  . Alcohol use: Not Currently    Comment: OCCAS - MAYBE ONCE A MONTH  . Drug use: No  . Sexual activity: Not Currently  Other Topics Concern  . Not on file  Social History Narrative  . Not on file   Social Determinants of Health   Financial Resource Strain:   . Difficulty of Paying Living Expenses:   Food Insecurity:   . Worried About Charity fundraiser in the Last Year:   . Arboriculturist in the Last Year:   Transportation Needs:   . Film/video editor (Medical):   Marland Kitchen Lack of Transportation (Non-Medical):   Physical Activity:   . Days of Exercise per Week:   . Minutes of Exercise per Session:   Stress:   . Feeling of Stress :  Social Connections:   . Frequency of Communication with Friends and Family:   . Frequency of Social Gatherings with Friends and Family:   . Attends Religious Services:   . Active Member of Clubs or Organizations:   . Attends Archivist Meetings:   Marland Kitchen Marital Status:   Intimate Partner Violence:   . Fear of Current or Ex-Partner:   . Emotionally Abused:   Marland Kitchen Physically Abused:   . Sexually Abused:     Family History  Problem Relation Age of Onset  . Cancer Mother        pt unaware of what kind  . Hearing loss Mother   . Coronary artery disease Father   . Diabetes Father   . Coronary artery disease Brother   . Coronary artery disease Brother   . Diabetes Sister   . Cancer Sister        breast  . Colon cancer Neg Hx   . Stomach cancer Neg Hx   . Rectal  cancer Neg Hx   . Esophageal cancer Neg Hx     Current Facility-Administered Medications  Medication Dose Route Frequency Provider Last Rate Last Admin  . acetaminophen (TYLENOL) 160 MG/5ML solution 500 mg  500 mg Oral Q6H PRN Alphonsa Overall, MD      . fluconazole (DIFLUCAN) tablet 150 mg  150 mg Oral Once Michael Boston, MD      . furosemide (LASIX) tablet 20 mg  20 mg Oral Daily Alphonsa Overall, MD   20 mg at 09/10/19 0828  . heparin injection 5,000 Units  5,000 Units Subcutaneous Q8H Alphonsa Overall, MD   5,000 Units at 09/10/19 0617  . levothyroxine (SYNTHROID) tablet 50 mcg  50 mcg Oral QAC breakfast Alphonsa Overall, MD   50 mcg at 09/10/19 0617  . morphine 2 MG/ML injection 1-3 mg  1-3 mg Intravenous Q2H PRN Alphonsa Overall, MD      . ondansetron (ZOFRAN-ODT) disintegrating tablet 4 mg  4 mg Oral Q6H PRN Alphonsa Overall, MD       Or  . ondansetron [facility]) injection 4 mg  4 mg Intravenous Q6H PRN Alphonsa Overall, MD      . oxyCODONE (ROXICODONE) 5 MG/5ML solution 5 mg  5 mg Oral Q4H PRN Alphonsa Overall, MD   5 mg at 09/10/19 [id number]  . pantoprazole (PROTONIX) EC tablet 40 mg  40 mg Oral Daily Alphonsa Overall, MD   40 mg at 09/10/19 0829  . traMADol (ULTRAM) tablet 50 mg  50 mg Oral Q6H PRN Alphonsa Overall, MD         No Known Allergies  Signed: Morton Peters, MD, FACS, MASCRS Gastrointestinal and Minimally Invasive Surgery  [facility] [address] [place],  [address] [phone] Fax [phone] Main/Paging  CONTACT INFORMATION: Weekday (9AM-5PM) concerns: Call [facility] main office at [phone] Weeknight (5PM-9AM) or Weekend/Holiday concerns: Check [email or web address] for General Surgery [facility] coverage (Please, do not use SecureChat as it is not reliable communication to surgeons for patient care)      09/10/2019, 10:43 AM

## 2019-09-10 NOTE — Plan of Care (Signed)
All discharge instructions were given to Pt. Wound dressing demo and return demo done with Pt's husband, he verbalized understanding. Wound care materials provided.

## 2019-09-10 NOTE — Progress Notes (Signed)
Patient resting comfortably at this time, patient tolerating diet and taking in plenty of liquids so IV fluids were d/c'ed at this time, patient denies the need for pain medication currently, vss, will continue to monitor.

## 2019-09-12 LAB — SURGICAL PATHOLOGY

## 2019-10-26 ENCOUNTER — Encounter: Payer: Self-pay | Admitting: Family Medicine

## 2019-10-26 ENCOUNTER — Ambulatory Visit (INDEPENDENT_AMBULATORY_CARE_PROVIDER_SITE_OTHER): Payer: Medicare Other | Admitting: Family Medicine

## 2019-10-26 ENCOUNTER — Other Ambulatory Visit: Payer: Self-pay

## 2019-10-26 VITALS — BP 138/72 | HR 87 | Temp 97.8°F | Wt 190.3 lb

## 2019-10-26 DIAGNOSIS — R202 Paresthesia of skin: Secondary | ICD-10-CM

## 2019-10-26 DIAGNOSIS — G2581 Restless legs syndrome: Secondary | ICD-10-CM

## 2019-10-26 MED ORDER — PRAMIPEXOLE DIHYDROCHLORIDE 0.125 MG PO TABS
ORAL_TABLET | ORAL | 5 refills | Status: DC
Start: 2019-10-26 — End: 2020-05-21

## 2019-10-26 NOTE — Progress Notes (Signed)
Established Patient Office Visit  Subjective:  Patient ID: Kristen Escobar, female    DOB: 27-Jan-1946  Age: 74 y.o. MRN: [id number]  CC:  Chief Complaint  Patient presents with  . Numbness    pt has numbness in toes for about 2 weeks     HPI Kristen Escobar presents for several month history of some numbness involving her toes and feet.  This is not total numbness.  She denies any associated pain or weakness.  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  She had chemotherapy and was told that her numbness may be related to that.  No history of diabetes.  She feels her symptoms are actually slightly improved compared to several months ago.  She was told by oncologist this could take several years to fully recover.  She is on chronic PPI with omeprazole.   Has not had any recent B12 level.  She also complains of restless leg symptoms.  These are worse at night.  She states her legs feel "fidgety" and sometimes restless and are starting to interfere with sleep.  Improved with walking.  No caffeine use.  Recent hemoglobin 12.3.  Symptoms are moderate to occasionally severe at times.  Past Medical History:  Diagnosis Date  . Acute respiratory failure with hypoxia ([place]) 02/16/2017  . Arthritis   . Cancer ([place]) 03/2009   breast- rt  . Complication of anesthesia    Hard to void after knee replacement 05-2018  . Diffuse large B cell lymphoma ([place]) dx'd 02/17/17   in remission 2 years now   had chemo   . GERD (gastroesophageal reflux disease)   . History of radiation therapy 07/12/10,completed   right breast 60 Gy x30 fx  . Hypertension    no meds currently  . Hypothyroidism   . Obesity   . Peripheral vascular disease ([place]) 1995   PT DEVELOPED CIRCULATION PROBLEMS IN BOTH HANDS AND GANGRENE OF BOTH INDEX FINGERS--REQUIRING AMPUTATION OF THE INDEX FINGERS AND VASCULAR SURGERY.  PT TOLD HER PROBLEMS RELATED TO SMOKING.   NO OTHER PROBLEMS  SINCE.  Marland Kitchen Personal history of radiation therapy 2012   Right Breast Cancer  . Pneumonia   . Thyroid mass     Past Surgical History:  Procedure Laterality Date  . AMPUTATION     partial amputation of both index fingers  . BREAST LUMPECTOMY Right 04/01/2010  . BREAST SURGERY  2011   lumpectomy with node sampling- RIGHT  . CATARACT EXTRACTION, BILATERAL  approx 2017  . COLONOSCOPY    . ESOPHAGOGASTRODUODENOSCOPY    . EXCISION MASS NECK Left 12/26/2016   Procedure: EXCISION MASS NECK;  Surgeon: Izora Gala, MD;  Location: [facility];  Service: ENT;  Laterality: Left;  open excision of thyroid mass left side with frozen section  . HAND SURGERY Bilateral 1995   Amputaed pointer fingers bilaterally  . IR FLUORO GUIDE PORT INSERTION RIGHT  01/14/2017  . IR GASTROSTOMY TUBE MOD SED  02/19/2017  . IR GASTROSTOMY TUBE REMOVAL  12/07/2018  . IR PATIENT EVAL TECH 0-60 MINS  01/14/2019  . IR REMOVAL TUN ACCESS W/ PORT W/O FL MOD SED  11/30/2017  . IR REPLACE G-TUBE SIMPLE WO FLUORO  08/03/2017  . IR REPLACE G-TUBE SIMPLE WO FLUORO  06/10/2018  . IR REPLC GASTRO/COLONIC TUBE PERCUT W/FLUORO  02/25/2017  . IR [place] TUBE PERCUT W/FLUORO  03/09/2017  . IR US GUIDE BX ASP/DRAIN  12/07/2018  .  IR US GUIDE VASC ACCESS RIGHT  01/14/2017  . JOINT REPLACEMENT    . LAPAROSCOPIC GASTRIC RESECTION N/A 09/09/2019   Procedure: LAPAROSCOPIC CLOSURE OF GASTRO-CUTANEOUS FISTULA, EXCISION OF FISTUAL TRACK;  Surgeon: Alphonsa Overall, MD;  Location: [facility];  Service: General;  Laterality: N/A;  . TOTAL HIP ARTHROPLASTY  12/16/2011   right hip  . TOTAL HIP ARTHROPLASTY  01/20/2012   Procedure: TOTAL HIP ARTHROPLASTY ANTERIOR APPROACH;  Surgeon: Mauri Pole, MD;  Location: [facility];  Service: Orthopedics;  Laterality: Left;  . TOTAL KNEE ARTHROPLASTY Right 05/31/2018   Procedure: TOTAL KNEE ARTHROPLASTY;  Surgeon: Gaynelle Arabian, MD;  Location: [facility];  Service: Orthopedics;  Laterality: Right;  Adductor Block  .  TUBAL LIGATION    . VASCULAR SURGERY     both hands    Family History  Problem Relation Age of Onset  . Cancer Mother        pt unaware of what kind  . Hearing loss Mother   . Coronary artery disease Father   . Diabetes Father   . Coronary artery disease Brother   . Coronary artery disease Brother   . Diabetes Sister   . Cancer Sister        breast  . Colon cancer Neg Hx   . Stomach cancer Neg Hx   . Rectal cancer Neg Hx   . Esophageal cancer Neg Hx     Social History   Socioeconomic History  . Marital status: Married    Spouse name: Not on file  . Number of children: Not on file  . Years of education: Not on file  . Highest education level: Not on file  Occupational History  . Not on file  Tobacco Use  . Smoking status: Former Smoker    Packs/day: 1.00    Years: 20.00    Pack years: 20.00    Quit date: 04/21/1993    Years since quitting: 26.5  . Smokeless tobacco: Never Used  Vaping Use  . Vaping Use: Never used  Substance and Sexual Activity  . Alcohol use: Not Currently    Comment: OCCAS - MAYBE ONCE A MONTH  . Drug use: No  . Sexual activity: Not Currently  Other Topics Concern  . Not on file  Social History Narrative  . Not on file   Social Determinants of Health   Financial Resource Strain:   . Difficulty of Paying Living Expenses:   Food Insecurity:   . Worried About Charity fundraiser in the Last Year:   . Arboriculturist in the Last Year:   Transportation Needs:   . Film/video editor (Medical):   Marland Kitchen Lack of Transportation (Non-Medical):   Physical Activity:   . Days of Exercise per Week:   . Minutes of Exercise per Session:   Stress:   . Feeling of Stress :   Social Connections:   . Frequency of Communication with Friends and Family:   . Frequency of Social Gatherings with Friends and Family:   . Attends Religious Services:   . Active Member of Clubs or Organizations:   . Attends Archivist Meetings:   Marland Kitchen Marital Status:     Intimate Partner Violence:   . Fear of Current or Ex-Partner:   . Emotionally Abused:   Marland Kitchen Physically Abused:   . Sexually Abused:     Outpatient Medications Prior to Visit  Medication Sig Dispense Refill  . furosemide (LASIX) 20 MG tablet Take 1  tablet by mouth twice daily (Patient taking differently: Take 20 mg by mouth daily. Mix with applesauce/yogurt) 360 tablet 0  . levothyroxine (SYNTHROID) 50 MCG tablet Place 1 tablet (50 mcg total) into feeding tube daily. (Patient taking differently: Take 50 mcg by mouth daily before breakfast. Mix with applesauce/yogurt) 90 tablet 3  . loperamide (IMODIUM A-D) 2 MG tablet Place 2 mg into feeding tube 4 (four) times daily as needed for diarrhea or loose stools.    . Multiple Vitamins-Minerals (ADULT GUMMY PO) Take 2 tablets by mouth daily. Gummy    . omeprazole (PRILOSEC) 20 MG capsule Take 20 mg by mouth daily. Mix with applesauce/yogurt    . traMADol (ULTRAM) 50 MG tablet Take 1 tablet (50 mg total) by mouth every 6 (six) hours as needed (mild pain). 20 tablet 0   No facility-administered medications prior to visit.    No Known Allergies  ROS Review of Systems  Constitutional: Negative for appetite change, chills, fever and unexpected weight change.  Respiratory: Negative for cough and shortness of breath.   Cardiovascular: Negative for chest pain.  Genitourinary: Negative for dysuria.  Neurological: Positive for numbness. Negative for dizziness, seizures, syncope, facial asymmetry, speech difficulty, weakness and headaches.  Hematological: Negative for adenopathy.      Objective:    Physical Exam Vitals reviewed.  Constitutional:      Appearance: Normal appearance.  Cardiovascular:     Rate and Rhythm: Normal rate and regular rhythm.  Pulmonary:     Effort: Pulmonary effort is normal.     Breath sounds: Normal breath sounds.  Musculoskeletal:     Right lower leg: No edema.     Left lower leg: No edema.  Neurological:      Mental Status: She is alert.     Comments: Full strength lower extremities.   She has mild impairment with monofilament feet and toes bilaterally (not totally impaired with monofilament).  Good distal pulses.     BP 138/72 (BP Location: Left Arm, Patient Position: Sitting, Cuff Size: Normal)   Pulse 87   Temp 97.8 F (36.6 C) (Temporal)   Wt 190 lb 4.8 oz (86.3 kg)   SpO2 97%   BMI 34.81 kg/m  Wt Readings from Last 3 Encounters:  10/26/19 190 lb 4.8 oz (86.3 kg)  09/09/19 188 lb 4 oz (85.4 kg)  09/02/19 188 lb 4 oz (85.4 kg)     Health Maintenance Due  Topic Date Due  . TETANUS/TDAP  04/21/2016    There are no preventive care reminders to display for this patient.  Lab Results  Component Value Date   TSH 4.93 (H) 02/28/2019   Lab Results  Component Value Date   WBC 11.9 (H) 09/02/2019   HGB 12.3 09/02/2019   HCT 39.6 09/02/2019   MCV 82.8 09/02/2019   PLT 321 09/02/2019   Lab Results  Component Value Date   NA 137 09/02/2019   K 4.9 09/02/2019   CHLORIDE 103 04/10/2017   CO2 27 09/02/2019   GLUCOSE 94 09/02/2019   BUN 25 (H) 09/02/2019   CREATININE 0.96 09/02/2019   BILITOT 0.7 09/02/2019   ALKPHOS 63 09/02/2019   AST 21 09/02/2019   ALT 13 09/02/2019   PROT 7.7 09/02/2019   ALBUMIN 3.2 (L) 09/02/2019   CALCIUM 8.7 (L) 09/02/2019   ANIONGAP 9 09/02/2019   EGFR >60 04/10/2017   GFR 46.37 (L) 03/25/2016   Lab Results  Component Value Date   CHOL 152 03/25/2016  Lab Results  Component Value Date   HDL 49.80 03/25/2016   Lab Results  Component Value Date   LDLCALC 78 03/25/2016   Lab Results  Component Value Date   TRIG 108 02/18/2017   Lab Results  Component Value Date   CHOLHDL 3 03/25/2016   Lab Results  Component Value Date   HGBA1C 5.7 (H) 06/02/2018      Assessment & Plan:   #1 paresthesias involving the feet.  Probably related to chemotherapy.  With chronic PPI use rule out B12 deficiency -Check B12 level and she will return  for that lab  #2 probable restless leg syndrome -Continue to avoid caffeine -Consider trial of Mirapex 0.125 mg 1-2 nightly as needed  Meds ordered this encounter  Medications  . pramipexole (MIRAPEX) 0.125 MG tablet    Sig: Take one to two tablets at night as needed for restless legs.    Dispense:  60 tablet    Refill:  5    Follow-up: No follow-ups on file.    Carolann Littler, MD

## 2019-10-26 NOTE — Patient Instructions (Signed)

## 2019-10-27 DIAGNOSIS — G2581 Restless legs syndrome: Secondary | ICD-10-CM | POA: Insufficient documentation

## 2019-11-01 ENCOUNTER — Other Ambulatory Visit: Payer: Self-pay

## 2019-11-01 ENCOUNTER — Other Ambulatory Visit (INDEPENDENT_AMBULATORY_CARE_PROVIDER_SITE_OTHER): Payer: Medicare Other

## 2019-11-01 DIAGNOSIS — R202 Paresthesia of skin: Secondary | ICD-10-CM

## 2019-11-02 LAB — VITAMIN B12: Vitamin B-12: 757 pg/mL (ref 200–1100)

## 2019-11-16 ENCOUNTER — Other Ambulatory Visit: Payer: Medicare Other

## 2019-11-16 ENCOUNTER — Other Ambulatory Visit: Payer: Self-pay

## 2019-11-16 ENCOUNTER — Ambulatory Visit (HOSPITAL_COMMUNITY)
Admission: RE | Admit: 2019-11-16 | Discharge: 2019-11-16 | Disposition: A | Discharge status: Home / Self Care | Payer: Medicare Other | Source: Ambulatory Visit | Attending: Hematology and Oncology | Admitting: Hematology and Oncology

## 2019-11-16 ENCOUNTER — Encounter (HOSPITAL_COMMUNITY): Payer: Self-pay

## 2019-11-16 ENCOUNTER — Inpatient Hospital Stay: Payer: Medicare Other | Attending: Hematology and Oncology

## 2019-11-16 DIAGNOSIS — Z8572 Personal history of non-Hodgkin lymphomas: Secondary | ICD-10-CM | POA: Insufficient documentation

## 2019-11-16 DIAGNOSIS — Z853 Personal history of malignant neoplasm of breast: Secondary | ICD-10-CM | POA: Diagnosis not present

## 2019-11-16 DIAGNOSIS — C8330 Diffuse large B-cell lymphoma, unspecified site: Secondary | ICD-10-CM | POA: Insufficient documentation

## 2019-11-16 DIAGNOSIS — Z86711 Personal history of pulmonary embolism: Secondary | ICD-10-CM | POA: Insufficient documentation

## 2019-11-16 DIAGNOSIS — J432 Centrilobular emphysema: Secondary | ICD-10-CM | POA: Diagnosis not present

## 2019-11-16 DIAGNOSIS — I2510 Atherosclerotic heart disease of native coronary artery without angina pectoris: Secondary | ICD-10-CM | POA: Diagnosis not present

## 2019-11-16 DIAGNOSIS — C8590 Non-Hodgkin lymphoma, unspecified, unspecified site: Secondary | ICD-10-CM | POA: Diagnosis not present

## 2019-11-16 DIAGNOSIS — C50411 Malignant neoplasm of upper-outer quadrant of right female breast: Secondary | ICD-10-CM

## 2019-11-16 DIAGNOSIS — Z79899 Other long term (current) drug therapy: Secondary | ICD-10-CM | POA: Diagnosis not present

## 2019-11-16 DIAGNOSIS — Z923 Personal history of irradiation: Secondary | ICD-10-CM | POA: Insufficient documentation

## 2019-11-16 DIAGNOSIS — I700 Atherosclerosis of aorta: Secondary | ICD-10-CM | POA: Diagnosis not present

## 2019-11-16 DIAGNOSIS — Z9221 Personal history of antineoplastic chemotherapy: Secondary | ICD-10-CM | POA: Diagnosis not present

## 2019-11-16 LAB — CBC WITH DIFFERENTIAL (CANCER CENTER ONLY)
Abs Immature Granulocytes: 0.02 10*3/uL (ref 0.00–0.07)
Basophils Absolute: 0.1 10*3/uL (ref 0.0–0.1)
Basophils Relative: 1 %
Eosinophils Absolute: 0.4 10*3/uL (ref 0.0–0.5)
Eosinophils Relative: 5 %
HCT: 40.1 % (ref 36.0–46.0)
Hemoglobin: 12.7 g/dL (ref 12.0–15.0)
Immature Granulocytes: 0 %
Lymphocytes Relative: 25 %
Lymphs Abs: 1.7 10*3/uL (ref 0.7–4.0)
MCH: 25.9 pg — ABNORMAL LOW (ref 26.0–34.0)
MCHC: 31.7 g/dL (ref 30.0–36.0)
MCV: 81.7 fL (ref 80.0–100.0)
Monocytes Absolute: 0.8 10*3/uL (ref 0.1–1.0)
Monocytes Relative: 12 %
Neutro Abs: 4 10*3/uL (ref 1.7–7.7)
Neutrophils Relative %: 57 %
Platelet Count: 234 10*3/uL (ref 150–400)
RBC: 4.91 MIL/uL (ref 3.87–5.11)
RDW: 17.8 % — ABNORMAL HIGH (ref 11.5–15.5)
WBC Count: 7.1 10*3/uL (ref 4.0–10.5)
nRBC: 0 % (ref 0.0–0.2)

## 2019-11-16 LAB — CMP (CANCER CENTER ONLY)
ALT: 8 U/L (ref 0–44)
AST: 17 U/L (ref 15–41)
Albumin: 3.3 g/dL — ABNORMAL LOW (ref 3.5–5.0)
Alkaline Phosphatase: 72 U/L (ref 38–126)
Anion gap: 8 (ref 5–15)
BUN: 14 mg/dL (ref 8–23)
CO2: 26 mmol/L (ref 22–32)
Calcium: 9.8 mg/dL (ref 8.9–10.3)
Chloride: 107 mmol/L (ref 98–111)
Creatinine: 0.9 mg/dL (ref 0.44–1.00)
GFR, Est AFR Am: >60 mL/min (ref >60)
GFR, Estimated: >60 mL/min (ref >60)
Glucose, Bld: 87 mg/dL (ref 70–99)
Potassium: 4.5 mmol/L (ref 3.5–5.1)
Sodium: 141 mmol/L (ref 135–145)
Total Bilirubin: 0.6 mg/dL (ref 0.3–1.2)
Total Protein: 7.6 g/dL (ref 6.5–8.1)

## 2019-11-16 LAB — LACTATE DEHYDROGENASE: LDH: 171 U/L (ref 98–192)

## 2019-11-16 MED ORDER — SODIUM CHLORIDE (PF) 0.9 % IJ SOLN
INTRAMUSCULAR | Status: AC
  Filled 2019-11-16: qty 50

## 2019-11-16 MED ORDER — IOHEXOL 300 MG/ML  SOLN
75.0000 mL | Freq: Once | INTRAMUSCULAR | Status: AC | PRN
  Administered 2019-11-16: 75 mL via INTRAVENOUS

## 2019-11-17 NOTE — Progress Notes (Signed)
Patient Care Team: Eulas Post, MD as PCP - General (Family Medicine) Marcy Panning, MD as Consulting Physician (Internal Medicine) Thea Silversmith, MD as Consulting Physician (Radiation Oncology) Nicholas Lose, MD as Consulting Physician (Hematology and Oncology) Gaynelle Arabian, MD as Consulting Physician (Orthopedic Surgery) Danella Sensing, MD as Consulting Physician (Dermatology) Melissa Noon, Brave as Consulting Physician (Optometry) Ernestine Conrad, MD as Consulting Physician (Otolaryngology)  DIAGNOSIS:    ICD-10-CM   1. 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 Radiation recall related to tamoxifen   11/25/2010 -  Anti-estrogen oral therapy   Aromasin 25 mg daily.myalgias and arthralgias stop November 2012 went back to tamoxifen 20 mg daily   01/06/2017 Pathology Results   Bone marrow biopsy: Slightly hypercellular bone marrow for age with granulocytic hyperplasia, numerous small lymphoid aggregates present. This may represent minimal involvement by low-grade B-cell lymphoma or liver process like SLL/CLL because they are CD5 positive   Diffuse large B cell lymphoma ([place])  12/26/2016 Initial Diagnosis   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 with above-mentioned history of breast cancer and large B cell lymphoma.Chest CT on 11/17/19 showed no abnormalities. She presents to the clinic todayfor follow-up.  Her abdominal incision has healed up.  She had a fistula with the PEG tube was present.  This was taken out.  She is exercising regularly in her pool.  Her only issue is occasional difficulty with swallowing things.  She is following with gastroenterology for planning to stretch her esophagus.  ALLERGIES:  has No Known Allergies.  MEDICATIONS:  Current Outpatient Medications  Medication Sig Dispense Refill  . furosemide (LASIX) 20 MG tablet Take 1 tablet by mouth twice daily (Patient taking differently: Take 20 mg by mouth daily. Mix with applesauce/yogurt) 360 tablet 0  . levothyroxine (SYNTHROID) 50 MCG tablet Place 1 tablet (50 mcg total) into feeding tube daily. (Patient taking differently: Take 50 mcg by mouth daily before breakfast. Mix with applesauce/yogurt) 90 tablet 3  . loperamide (IMODIUM A-D) 2 MG tablet Place 2 mg into feeding tube 4 (four) times daily as needed for diarrhea or loose stools.    . Multiple Vitamins-Minerals (ADULT GUMMY PO) Take 2 tablets by mouth daily. Gummy    . omeprazole (PRILOSEC) 20 MG capsule Take 20 mg by mouth daily. Mix with applesauce/yogurt    . pramipexole (MIRAPEX) 0.125 MG tablet Take one to two tablets at night as needed for restless legs. 60 tablet 5  .  traMADol (ULTRAM) 50 MG tablet Take 1 tablet (50 mg total) by mouth every 6 (six) hours as needed (mild pain). 20 tablet 0   No current facility-administered medications for this  visit.    PHYSICAL EXAMINATION: ECOG PERFORMANCE STATUS: 1 - Symptomatic but completely ambulatory  Vitals:   11/18/19 1041  BP: (!) 126/60  Pulse: 72  Resp: 18  Temp: 98.5 F (36.9 C)  SpO2: 98%   Filed Weights   11/18/19 1041  Weight: 188 lb 3.2 oz (85.4 kg)       LABORATORY DATA:  I have reviewed the data as listed CMP Latest Ref Rng & Units 11/16/2019 09/02/2019 05/19/2019  Glucose 70 - 99 mg/dL 87 94 80  BUN 8 - 23 mg/dL 14 25(H) 22  Creatinine 0.44 - 1.00 mg/dL 0.90 0.96 1.01(H)  Sodium 135 - 145 mmol/L 141 137 141  Potassium 3.5 - 5.1 mmol/L 4.5 4.9 4.4  Chloride 98 - 111 mmol/L 107 101 106  CO2 22 - 32 mmol/L _0 Calcium 8.9 - 10.3 mg/dL 9.8 8.7(L) 8.7(L)  Total Protein 6.5 - 8.1 g/dL 7.6 7.7 7.5  Total Bilirubin 0.3 - 1.2 mg/dL 0.6 0.7 0.5  Alkaline Phos 38 - 126 U/L 72 63 86  AST 15 - 41 U/L _1 ALT 0 - 44 U/L _2 Lab Results  Component Value Date   WBC 7.1 11/16/2019   HGB 12.7 11/16/2019   HCT 40.1 11/16/2019   MCV 81.7 11/16/2019   PLT 234 11/16/2019   NEUTROABS 4.0 11/16/2019    ASSESSMENT & PLAN:  Diffuse large B cell lymphoma ([facility]) 12/26/2016 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 positive for LCA, CD20, CD79a PAX-5, BCL-6 and weak cytoplasmic kappa associated with patchy weak positivity for BCL-2 and CD10, Ki-67 40%  Stage IIb(bilateral cervical, esophageal, mediastinal, supraclavicular nodes)  Bone marrow biopsy negative for diffuse large B cell lymphoma but suggestive of small low-grade B cell population possibly SLL/CLL  Revised IPI score: 1, good prognosis, redictated for years PFS 80%, predicted overall survival 79% ------------------------------------------------------------------------ Priortreatment: R CHOPX 5cycles   TE Fistula:Repairedcurrently on PEG tube feeds esophageal stricture: Dilatationand undergoing procedures to close the T-E fistula Being performed by  gastroenterology 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 CompletedNovember 2019. 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 PET CT scan 11/11/2018:No evidence of lymphoma on the scan. Hypermetabolic activity around the PEG tube site. CT chest with contrast November 16, 2019: No acute cardiopulmonary abnormalities no residual lymphoma.  Emphysema, coronary calcifications, gallstones  Return to clinic in 6 months with labs and follow-up.     No orders of the defined types were placed in this encounter.  The patient has a good understanding of the overall plan. she agrees with it. she will call with any problems that may develop before the next visit here.  Total time spent: 30 mins including face to face time and time spent for planning, charting and coordination of care  Nicholas Lose, MD 11/18/2019  I, Cloyde Reams Dorshimer, am acting as scribe for Dr. Nicholas Lose.  I have reviewed the above documentation for accuracy and completeness, and I agree with the above.

## 2019-11-18 ENCOUNTER — Other Ambulatory Visit: Payer: Medicare Other

## 2019-11-18 ENCOUNTER — Other Ambulatory Visit: Payer: Self-pay

## 2019-11-18 ENCOUNTER — Inpatient Hospital Stay (HOSPITAL_BASED_OUTPATIENT_CLINIC_OR_DEPARTMENT_OTHER): Payer: Medicare Other | Admitting: Hematology and Oncology

## 2019-11-18 ENCOUNTER — Telehealth: Payer: Self-pay | Admitting: Hematology and Oncology

## 2019-11-18 DIAGNOSIS — Z8572 Personal history of non-Hodgkin lymphomas: Secondary | ICD-10-CM | POA: Diagnosis not present

## 2019-11-18 DIAGNOSIS — Z923 Personal history of irradiation: Secondary | ICD-10-CM | POA: Diagnosis not present

## 2019-11-18 DIAGNOSIS — Z79899 Other long term (current) drug therapy: Secondary | ICD-10-CM | POA: Diagnosis not present

## 2019-11-18 DIAGNOSIS — Z9221 Personal history of antineoplastic chemotherapy: Secondary | ICD-10-CM | POA: Diagnosis not present

## 2019-11-18 DIAGNOSIS — Z853 Personal history of malignant neoplasm of breast: Secondary | ICD-10-CM | POA: Diagnosis not present

## 2019-11-18 DIAGNOSIS — C8330 Diffuse large B-cell lymphoma, unspecified site: Secondary | ICD-10-CM

## 2019-11-18 DIAGNOSIS — Z86711 Personal history of pulmonary embolism: Secondary | ICD-10-CM | POA: Diagnosis not present

## 2019-11-18 NOTE — Telephone Encounter (Signed)
Scheduled appts per 7/30 los. Gave pt a print out of appt calendar.

## 2019-11-18 NOTE — Assessment & Plan Note (Signed)
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 positive for LCA, CD20, CD79a PAX-5, BCL-6 and weak cytoplasmic kappa associated with patchy weak positivity for BCL-2 and CD10, Ki-67 40%  Stage IIb(bilateral cervical, esophageal, mediastinal, supraclavicular nodes)  Bone marrow biopsy negative for diffuse large B cell lymphoma but suggestive of small low-grade B cell population possibly SLL/CLL  Revised IPI score: 1, good prognosis, redictated for years PFS 80%, predicted overall survival 79% ------------------------------------------------------------------------ Priortreatment: R CHOPX 5cycles   TE Fistula:Repairedcurrently on PEG tube feeds esophageal stricture: Dilatationand undergoing procedures to close the T-E fistula Being performed by gastroenterology 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 CompletedNovember 2019. 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 PET CT scan 11/11/2018:No evidence of lymphoma on the scan. Hypermetabolic activity around the PEG tube site. CT chest with contrast November 16, 2019: No acute cardiopulmonary abnormalities no residual lymphoma.  Emphysema, coronary calcifications, gallstones   Return to clinic in 6 months with labs and follow-up.  

## 2019-12-16 ENCOUNTER — Telehealth: Payer: Self-pay

## 2019-12-16 NOTE — Telephone Encounter (Signed)
Pt's husband called asking if pt would be OK to have dental procedure. Pt's labs do not indicate netutropenia or low platelets, so it is OK to move forward with procedure. Husband states dentist will prescribe prophylactic abx. Pt also inquired about f/u appt for 1/31, as per Dr Geralyn Flash note he would like to see her in 6 mos, but at this time pt is only scheduled for labs that day.  This nurse sent message to scheduling to add MD visit. Pt and husband verbalized thanks and understanding.

## 2020-02-07 ENCOUNTER — Other Ambulatory Visit: Payer: Self-pay | Admitting: Hematology and Oncology

## 2020-02-07 DIAGNOSIS — Z1231 Encounter for screening mammogram for malignant neoplasm of breast: Secondary | ICD-10-CM

## 2020-02-08 ENCOUNTER — Ambulatory Visit (INDEPENDENT_AMBULATORY_CARE_PROVIDER_SITE_OTHER): Payer: Medicare Other

## 2020-02-08 ENCOUNTER — Other Ambulatory Visit: Payer: Self-pay

## 2020-02-08 ENCOUNTER — Encounter: Payer: Self-pay | Admitting: Family Medicine

## 2020-02-08 DIAGNOSIS — Z23 Encounter for immunization: Secondary | ICD-10-CM

## 2020-03-03 ENCOUNTER — Ambulatory Visit: Payer: Medicare Other | Attending: Internal Medicine

## 2020-03-03 DIAGNOSIS — Z23 Encounter for immunization: Secondary | ICD-10-CM

## 2020-03-03 NOTE — Progress Notes (Signed)
   Covid-19 Vaccination Clinic  Name:  Kristen Escobar    MRN: [id number] DOB: 07/29/1945  03/03/2020  [name] was observed post Covid-19 immunization for 15 minutes without incident. She was provided with Vaccine Information Sheet and instruction to access the V-Safe system.   [name] was instructed to call 911 with any severe reactions post vaccine: Marland Kitchen Difficulty breathing  . Swelling of face and throat  . A fast heartbeat  . A bad rash all over body  . Dizziness and weakness   Immunizations Administered    Name Date Dose VIS Date Route   Pfizer COVID-19 Vaccine 03/03/2020 12:16 PM 0.3 mL 02/08/2020 Intramuscular   Manufacturer: [place]   Lot: [id number]   [place]: [id number]

## 2020-03-06 ENCOUNTER — Ambulatory Visit: Payer: Medicare Other

## 2020-03-11 ENCOUNTER — Other Ambulatory Visit: Payer: Self-pay | Admitting: Family Medicine

## 2020-04-10 ENCOUNTER — Telehealth: Payer: Self-pay | Admitting: Hematology and Oncology

## 2020-04-10 NOTE — Telephone Encounter (Signed)
Rescheduled appt due to provider PAL. Patient is aware of changes. 

## 2020-04-15 ENCOUNTER — Other Ambulatory Visit: Payer: Self-pay | Admitting: Family Medicine

## 2020-04-30 ENCOUNTER — Ambulatory Visit: Payer: Medicare Other

## 2020-05-01 ENCOUNTER — Other Ambulatory Visit: Payer: Self-pay

## 2020-05-01 ENCOUNTER — Ambulatory Visit
Admission: RE | Admit: 2020-05-01 | Discharge: 2020-05-01 | Disposition: A | Discharge status: Home / Self Care | Payer: Medicare Other | Source: Ambulatory Visit | Attending: Hematology and Oncology | Admitting: Hematology and Oncology

## 2020-05-01 DIAGNOSIS — Z1231 Encounter for screening mammogram for malignant neoplasm of breast: Secondary | ICD-10-CM

## 2020-05-15 NOTE — Assessment & Plan Note (Signed)
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 positive for LCA, CD20, CD79a PAX-5, BCL-6 and weak cytoplasmic kappa associated with patchy weak positivity for BCL-2 and CD10, Ki-67 40%  Stage IIb(bilateral cervical, esophageal, mediastinal, supraclavicular nodes)  Bone marrow biopsy negative for diffuse large B cell lymphoma but suggestive of small low-grade B cell population possibly SLL/CLL  Revised IPI score: 1, good prognosis, redictated for years PFS 80%, predicted overall survival 79% ------------------------------------------------------------------------ Priortreatment: R CHOPX 5cycles   TE Fistula:Repairedcurrently on PEG tube feeds esophageal stricture: Dilatationand undergoing procedures to close the T-E fistula Being performed by gastroenterology 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 CompletedNovember 2019. 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 PET CT scan 11/11/2018:No evidence of lymphoma on the scan. Hypermetabolic activity around the PEG tube site. CT chest with contrast November 16, 2019: No acute cardiopulmonary abnormalities no residual lymphoma.  Emphysema, coronary calcifications, gallstones   Return to clinic in 6 months with labs and follow-up.

## 2020-05-15 NOTE — Assessment & Plan Note (Deleted)
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 positive for LCA, CD20, CD79a PAX-5, BCL-6 and weak cytoplasmic kappa associated with patchy weak positivity for BCL-2 and CD10, Ki-67 40%  Stage IIb(bilateral cervical, esophageal, mediastinal, supraclavicular nodes)  Bone marrow biopsy negative for diffuse large B cell lymphoma but suggestive of small low-grade B cell population possibly SLL/CLL  Revised IPI score: 1, good prognosis, redictated for years PFS 80%, predicted overall survival 79% ------------------------------------------------------------------------ Priortreatment: R CHOPX 5cycles   TE Fistula:Repairedcurrently on PEG tube feeds esophageal stricture: Dilatationand undergoing procedures to close the T-E fistula Being performed by gastroenterology 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 CompletedNovember 2019. 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 PET CT scan 11/11/2018:No evidence of lymphoma on the scan. Hypermetabolic activity around the PEG tube site. CT chest with contrast November 16, 2019: No acute cardiopulmonary abnormalities no residual lymphoma.  Emphysema, coronary calcifications, gallstones   Return to clinic in 6 months with labs and follow-up.  

## 2020-05-15 NOTE — Progress Notes (Signed)
Patient Care Team: Kristian Covey, MD as PCP - General (Family Medicine) Drue Second, MD as Consulting Physician (Internal Medicine) Lurline Hare, MD as Consulting Physician (Radiation Oncology) Serena Croissant, MD as Consulting Physician (Hematology and Oncology) Ollen Gross, MD as Consulting Physician (Orthopedic Surgery) Arminda Resides, MD as Consulting Physician (Dermatology) Manning Charity, OD as Consulting Physician (Optometry) Barnie Alderman, MD as Consulting Physician (Otolaryngology)  DIAGNOSIS:    ICD-10-CM   1. Malignant neoplasm of upper-outer quadrant of right breast in female, estrogen receptor positive (HCC)  C50.411    Z17.0   2. Diffuse large B-cell lymphoma, unspecified body region (HCC)  C83.30     SUMMARY OF ONCOLOGIC HISTORY: Oncology History  Breast cancer of upper-outer quadrant of right female breast (HCC)  04/22/2009 - 03/27/2010 Anti-estrogen oral therapy   Neoadjuvant antiestrogen therapy with tamoxifen for 10 months   03/28/2010 Surgery   Right breast lumpectomy invasive ductal-lobular carcinoma intermediate grade 0.5 cm, 4 SLN negative, reexcision margins clear: ER 95% PR 95% HER-2 negative Ki-67 8%   06/03/2010 - 07/12/2010 Radiation Therapy   Radiation therapy to lumpectomy site ? Radiation recall related to tamoxifen   11/25/2010 -  Anti-estrogen oral therapy   Aromasin 25 mg daily.myalgias and arthralgias stop November 2012 went back to tamoxifen 20 mg daily   01/06/2017 Pathology Results   Bone marrow biopsy: Slightly hypercellular bone marrow for age with granulocytic hyperplasia, numerous small lymphoid aggregates present. This may represent minimal involvement by low-grade B-cell lymphoma or liver process like SLL/CLL because they are CD5 positive   Diffuse large B cell lymphoma (HCC)  12/26/2016 Initial Diagnosis   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 with above-mentioned history of breast cancer and large B cell lymphoma.Mammogram on 05/01/20 showed no evidence of malignancy bilaterally. She presents to the clinic todayfor follow-up.  Patient is doing extremely well.  Her energy levels are excellent she is doing aerobics 5 days a week at [facility].  She is trying to lose some weight.  Swallowing is slightly problem and she is going to need some stretching.  ENT may be doing an injection to her vocal cords.  ALLERGIES:  has No Known Allergies.  MEDICATIONS:  Current Outpatient Medications  Medication Sig Dispense Refill  . levothyroxine (SYNTHROID) 50 MCG tablet PLACE 1 TABLET INTO FEEDING TUBE DAILY 90 tablet 0  . loperamide (IMODIUM A-D) 2 MG tablet Place 2 mg into feeding tube 4 (four) times daily as needed for diarrhea or loose stools.    . Multiple Vitamins-Minerals (ADULT GUMMY PO) Take 2 tablets by mouth daily. Gummy    . omeprazole (PRILOSEC) 20 MG capsule TAKE 1 CAPSULE PER  TUBE  ONCE DAILY 90 capsule 0  . pramipexole (MIRAPEX) 0.125 MG tablet Take one to two tablets at night as needed for restless legs. 60 tablet 5   No current facility-administered medications for this visit.    PHYSICAL EXAMINATION: ECOG PERFORMANCE STATUS: 1 - Symptomatic  but completely ambulatory  Vitals:   05/16/20 0857  BP: (!) 152/52  Pulse: (!) 58  Resp: 16  Temp: 97.7 F (36.5 C)  SpO2: 98%   Filed Weights   05/16/20 0857  Weight: 182 lb 14.4 oz (83 kg)      LABORATORY  DATA:  I have reviewed the data as listed CMP Latest Ref Rng & Units 11/16/2019 09/02/2019 05/19/2019  Glucose 70 - 99 mg/dL 87 94 80  BUN 8 - 23 mg/dL 14 16(X) 22  Creatinine 0.44 - 1.00 mg/dL 0.96 0.45 4.09(W)  Sodium 135 - 145 mmol/L 141 137 141  Potassium 3.5 - 5.1 mmol/L 4.5 4.9 4.4  Chloride 98 - 111 mmol/L 107 101 106  CO2 22 - 32 mmol/L 26 27 27   Calcium 8.9 - 10.3 mg/dL 9.8 1.1(B) 1.4(N)  Total Protein 6.5 - 8.1 g/dL 7.6 7.7 7.5  Total Bilirubin 0.3 - 1.2 mg/dL 0.6 0.7 0.5  Alkaline Phos 38 - 126 U/L 72 63 86  AST 15 - 41 U/L 17 21 16   ALT 0 - 44 U/L 8 13 10     Lab Results  Component Value Date   WBC 6.1 05/16/2020   HGB 12.7 05/16/2020   HCT 40.9 05/16/2020   MCV 83.1 05/16/2020   PLT 217 05/16/2020   NEUTROABS 3.6 05/16/2020    ASSESSMENT & PLAN:  Diffuse large B cell lymphoma (HCC) 12/26/2016 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 positive for LCA, CD20, CD79a PAX-5, BCL-6 and weak cytoplasmic kappa associated with patchy weak positivity for BCL-2 and CD10, Ki-67 40%  Stage IIb(bilateral cervical, esophageal, mediastinal, supraclavicular nodes)  Bone marrow biopsy negative for diffuse large B cell lymphoma but suggestive of small low-grade B cell population possibly SLL/CLL  Revised IPI score: 1, good prognosis, redictated for years PFS 80%, predicted overall survival 79% ------------------------------------------------------------------------ Priortreatment: R CHOPX 5cycles completed 04/10/2017  TE Fistula:Repairedcurrently on PEG tube feeds esophageal stricture: Dilatationand undergoing procedures to close the T-E fistula Being performed by gastroenterology 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 CompletedNovember 2019. 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 PET CT scan  11/11/2018:No evidence of lymphoma on the scan. Hypermetabolic activity around the PEG tube site. CT chest with contrast November 16, 2019: No acute cardiopulmonary abnormalities no residual lymphoma.  Emphysema, coronary calcifications, gallstones  I did not recommend scans unless she has symptoms or lab abnormalities that raise concerns for recurrent lymphoma.  Return to clinic in 6 months with labs and follow-up.     No orders of the defined types were placed in this encounter.  The patient has a good understanding of the overall plan. she agrees with it. she will call with any problems that may develop before the next visit here.  Total time spent: 20 mins including face to face time and time spent for planning, charting and coordination of care  Serena Croissant, MD 05/16/2020  I, Kirt Boys Dorshimer, am acting as scribe for Dr. Serena Croissant.  I have reviewed the above documentation for accuracy and completeness, and I agree with the above.

## 2020-05-16 ENCOUNTER — Other Ambulatory Visit: Payer: Self-pay

## 2020-05-16 ENCOUNTER — Inpatient Hospital Stay (HOSPITAL_BASED_OUTPATIENT_CLINIC_OR_DEPARTMENT_OTHER): Payer: Medicare Other | Admitting: Hematology and Oncology

## 2020-05-16 ENCOUNTER — Telehealth: Payer: Self-pay | Admitting: Hematology and Oncology

## 2020-05-16 ENCOUNTER — Inpatient Hospital Stay: Payer: Medicare Other | Attending: Hematology and Oncology

## 2020-05-16 DIAGNOSIS — C8330 Diffuse large B-cell lymphoma, unspecified site: Secondary | ICD-10-CM | POA: Insufficient documentation

## 2020-05-16 DIAGNOSIS — C50411 Malignant neoplasm of upper-outer quadrant of right female breast: Secondary | ICD-10-CM | POA: Insufficient documentation

## 2020-05-16 DIAGNOSIS — Z79811 Long term (current) use of aromatase inhibitors: Secondary | ICD-10-CM | POA: Insufficient documentation

## 2020-05-16 DIAGNOSIS — Z79899 Other long term (current) drug therapy: Secondary | ICD-10-CM | POA: Diagnosis not present

## 2020-05-16 DIAGNOSIS — Z170 Estrogen receptor positive status [ER+]: Secondary | ICD-10-CM | POA: Insufficient documentation

## 2020-05-16 LAB — CBC WITH DIFFERENTIAL (CANCER CENTER ONLY)
Abs Immature Granulocytes: 0.01 10*3/uL (ref 0.00–0.07)
Basophils Absolute: 0.1 10*3/uL (ref 0.0–0.1)
Basophils Relative: 1 %
Eosinophils Absolute: 0.3 10*3/uL (ref 0.0–0.5)
Eosinophils Relative: 5 %
HCT: 40.9 % (ref 36.0–46.0)
Hemoglobin: 12.7 g/dL (ref 12.0–15.0)
Immature Granulocytes: 0 %
Lymphocytes Relative: 24 %
Lymphs Abs: 1.5 10*3/uL (ref 0.7–4.0)
MCH: 25.8 pg — ABNORMAL LOW (ref 26.0–34.0)
MCHC: 31.1 g/dL (ref 30.0–36.0)
MCV: 83.1 fL (ref 80.0–100.0)
Monocytes Absolute: 0.7 10*3/uL (ref 0.1–1.0)
Monocytes Relative: 11 %
Neutro Abs: 3.6 10*3/uL (ref 1.7–7.7)
Neutrophils Relative %: 59 %
Platelet Count: 217 10*3/uL (ref 150–400)
RBC: 4.92 MIL/uL (ref 3.87–5.11)
RDW: 15.3 % (ref 11.5–15.5)
WBC Count: 6.1 10*3/uL (ref 4.0–10.5)
nRBC: 0 % (ref 0.0–0.2)

## 2020-05-16 LAB — CMP (CANCER CENTER ONLY)
ALT: 10 U/L (ref 0–44)
AST: 18 U/L (ref 15–41)
Albumin: 3.5 g/dL (ref 3.5–5.0)
Alkaline Phosphatase: 66 U/L (ref 38–126)
Anion gap: 9 (ref 5–15)
BUN: 18 mg/dL (ref 8–23)
CO2: 26 mmol/L (ref 22–32)
Calcium: 9.1 mg/dL (ref 8.9–10.3)
Chloride: 108 mmol/L (ref 98–111)
Creatinine: 1.06 mg/dL — ABNORMAL HIGH (ref 0.44–1.00)
GFR, Estimated: 55 mL/min — ABNORMAL LOW (ref >60)
Glucose, Bld: 83 mg/dL (ref 70–99)
Potassium: 4.4 mmol/L (ref 3.5–5.1)
Sodium: 143 mmol/L (ref 135–145)
Total Bilirubin: 0.4 mg/dL (ref 0.3–1.2)
Total Protein: 7.7 g/dL (ref 6.5–8.1)

## 2020-05-16 LAB — LACTATE DEHYDROGENASE: LDH: 173 U/L (ref 98–192)

## 2020-05-16 NOTE — Telephone Encounter (Signed)
Scheduled appointments per 1/26 los. Spoke to patient who is aware of appointments date and times. Gave patient calendar print out.

## 2020-05-21 ENCOUNTER — Other Ambulatory Visit: Payer: Self-pay | Admitting: Family Medicine

## 2020-05-21 ENCOUNTER — Other Ambulatory Visit: Payer: Medicare Other

## 2020-05-21 ENCOUNTER — Ambulatory Visit: Payer: Medicare Other | Admitting: Hematology and Oncology

## 2020-05-28 DIAGNOSIS — K222 Esophageal obstruction: Secondary | ICD-10-CM | POA: Diagnosis not present

## 2020-05-28 DIAGNOSIS — R490 Dysphonia: Secondary | ICD-10-CM | POA: Diagnosis not present

## 2020-05-28 DIAGNOSIS — R1310 Dysphagia, unspecified: Secondary | ICD-10-CM | POA: Diagnosis not present

## 2020-06-14 ENCOUNTER — Other Ambulatory Visit: Payer: Self-pay

## 2020-06-15 ENCOUNTER — Ambulatory Visit (INDEPENDENT_AMBULATORY_CARE_PROVIDER_SITE_OTHER): Payer: Medicare Other | Admitting: Family Medicine

## 2020-06-15 ENCOUNTER — Encounter: Payer: Self-pay | Admitting: Family Medicine

## 2020-06-15 VITALS — BP 182/70 | HR 54 | Ht 62.0 in | Wt 188.0 lb

## 2020-06-15 DIAGNOSIS — G2581 Restless legs syndrome: Secondary | ICD-10-CM | POA: Diagnosis not present

## 2020-06-15 DIAGNOSIS — E039 Hypothyroidism, unspecified: Secondary | ICD-10-CM | POA: Diagnosis not present

## 2020-06-15 DIAGNOSIS — I358 Other nonrheumatic aortic valve disorders: Secondary | ICD-10-CM

## 2020-06-15 DIAGNOSIS — I10 Essential (primary) hypertension: Secondary | ICD-10-CM | POA: Diagnosis not present

## 2020-06-15 DIAGNOSIS — R1314 Dysphagia, pharyngoesophageal phase: Secondary | ICD-10-CM | POA: Insufficient documentation

## 2020-06-15 LAB — T4, FREE: Free T4: 0.68 ng/dL (ref 0.60–1.60)

## 2020-06-15 LAB — TSH: TSH: 5.75 u[IU]/mL — ABNORMAL HIGH (ref 0.35–4.50)

## 2020-06-15 MED ORDER — LEVOTHYROXINE SODIUM 50 MCG PO TABS: ORAL_TABLET | ORAL | 3 refills | Status: DC

## 2020-06-15 MED ORDER — LOSARTAN POTASSIUM-HCTZ 50-12.5 MG PO TABS: 1.0000 | ORAL_TABLET | Freq: Every day | ORAL | 3 refills | Status: DC

## 2020-06-15 MED ORDER — PRAMIPEXOLE DIHYDROCHLORIDE 0.5 MG PO TABS: 0.5000 mg | ORAL_TABLET | Freq: Every day | ORAL | 3 refills | Status: DC

## 2020-06-15 MED ORDER — OMEPRAZOLE 20 MG PO CPDR: DELAYED_RELEASE_CAPSULE | ORAL | 3 refills | Status: DC

## 2020-06-15 NOTE — Progress Notes (Signed)
Established Patient Office Visit  Subjective:  Patient ID: Kristen Escobar, female    DOB: 1946-04-20  Age: 75 y.o. MRN: [id number]  CC:  Chief Complaint  Patient presents with  . Annual Exam    HPI Kristen Escobar presents for medical follow-up.  She has history of breast cancer is followed closely by oncology.  She had recent CBC and CMP and these results were reviewed.  She does have evidence for stage III chronic kidney disease but this appears to be stable.  She was treated for hypertension but lost a lot of weight with her breast cancer and her blood pressure did improve.  Her blood pressure back up today.  She not had any recent headaches or dizziness.  She had previously been on losartan HCTZ and tolerated well.  She has hypothyroidism is on levothyroxine 50 mcg daily.  Compliant with therapy.  Needs follow-up labs.  Last TSH was over a year ago.  She has restless leg syndrome.  Recent hemoglobin normal.  Avoids late the use of caffeine.  Symptoms worse at night.  She is on Mirapex up to 2.25 mg at night without much improvement.  This does interfere with sleep some.  She has history of murmur over aortic valve region.  She had echocardiogram 2018 which showed basically normal aortic valve with no stenosis or regurgitation.  No recent dizziness or dyspnea with activity  Past Medical History:  Diagnosis Date  . Acute respiratory failure with hypoxia ([place]) 02/16/2017  . Arthritis   . Cancer ([place]) 03/2009   breast- rt  . Complication of anesthesia    Hard to void after knee replacement 05-2018  . Diffuse large B cell lymphoma ([place]) dx'd 02/17/17   in remission 2 years now   had chemo   . GERD (gastroesophageal reflux disease)   . History of radiation therapy 07/12/10,completed   right breast 60 Gy x30 fx  . Hypertension    no meds currently  . Hypothyroidism   . Obesity   . Peripheral vascular disease ([place]) 1995   PT DEVELOPED CIRCULATION PROBLEMS IN BOTH HANDS AND GANGRENE OF  BOTH INDEX FINGERS--REQUIRING AMPUTATION OF THE INDEX FINGERS AND VASCULAR SURGERY.  PT TOLD HER PROBLEMS RELATED TO SMOKING.   NO OTHER PROBLEMS SINCE.  Marland Kitchen Personal history of radiation therapy 2012   Right Breast Cancer  . Pneumonia   . Thyroid mass     Past Surgical History:  Procedure Laterality Date  . AMPUTATION     partial amputation of both index fingers  . BREAST LUMPECTOMY Right 04/01/2010  . BREAST SURGERY  2011   lumpectomy with node sampling- RIGHT  . CATARACT EXTRACTION, BILATERAL  approx 2017  . COLONOSCOPY    . ESOPHAGOGASTRODUODENOSCOPY    . EXCISION MASS NECK Left 12/26/2016   Procedure: EXCISION MASS NECK;  Surgeon: Izora Gala, MD;  Location: [place];  Service: ENT;  Laterality: Left;  open excision of thyroid mass left side with frozen section  . HAND SURGERY Bilateral 1995   Amputaed pointer fingers bilaterally  . IR FLUORO GUIDE PORT INSERTION RIGHT  01/14/2017  . IR GASTROSTOMY TUBE MOD SED  02/19/2017  . IR GASTROSTOMY TUBE REMOVAL  12/07/2018  . IR PATIENT EVAL TECH 0-60 MINS  01/14/2019  . IR REMOVAL TUN ACCESS W/ PORT W/O FL MOD SED  11/30/2017  . IR REPLACE G-TUBE SIMPLE WO FLUORO  08/03/2017  . IR REPLACE G-TUBE SIMPLE WO FLUORO  06/10/2018  . IR REPLC GASTRO/COLONIC TUBE  PERCUT W/FLUORO  02/25/2017  . IR Vernal TUBE PERCUT W/FLUORO  03/09/2017  . IR US GUIDE BX ASP/DRAIN  12/07/2018  . IR US GUIDE VASC ACCESS RIGHT  01/14/2017  . JOINT REPLACEMENT    . LAPAROSCOPIC GASTRIC RESECTION N/A 09/09/2019   Procedure: LAPAROSCOPIC CLOSURE OF GASTRO-CUTANEOUS FISTULA, EXCISION OF FISTUAL TRACK;  Surgeon: Alphonsa Overall, MD;  Location: [facility];  Service: General;  Laterality: N/A;  . TOTAL HIP ARTHROPLASTY  12/16/2011   right hip  . TOTAL HIP ARTHROPLASTY  01/20/2012   Procedure: TOTAL HIP ARTHROPLASTY ANTERIOR APPROACH;  Surgeon: Mauri Pole, MD;  Location: [facility];  Service: Orthopedics;  Laterality: Left;  . TOTAL KNEE ARTHROPLASTY Right 05/31/2018    Procedure: TOTAL KNEE ARTHROPLASTY;  Surgeon: Gaynelle Arabian, MD;  Location: [facility];  Service: Orthopedics;  Laterality: Right;  Adductor Block  . TUBAL LIGATION    . VASCULAR SURGERY     both hands    Family History  Problem Relation Age of Onset  . Cancer Mother        pt unaware of what kind  . Hearing loss Mother   . Coronary artery disease Father   . Diabetes Father   . Coronary artery disease Brother   . Coronary artery disease Brother   . Diabetes Sister   . Cancer Sister        breast  . Colon cancer Neg Hx   . Stomach cancer Neg Hx   . Rectal cancer Neg Hx   . Esophageal cancer Neg Hx     Social History   Socioeconomic History  . Marital status: Married    Spouse name: Not on file  . Number of children: Not on file  . Years of education: Not on file  . Highest education level: Not on file  Occupational History  . Not on file  Tobacco Use  . Smoking status: Former Smoker    Packs/day: 1.00    Years: 20.00    Pack years: 20.00    Quit date: 04/21/1993    Years since quitting: 27.1  . Smokeless tobacco: Never Used  Vaping Use  . Vaping Use: Never used  Substance and Sexual Activity  . Alcohol use: Not Currently    Comment: OCCAS - MAYBE ONCE A MONTH  . Drug use: No  . Sexual activity: Not Currently  Other Topics Concern  . Not on file  Social History Narrative  . Not on file   Social Determinants of Health   Financial Resource Strain: Not on file  Food Insecurity: Not on file  Transportation Needs: Not on file  Physical Activity: Not on file  Stress: Not on file  Social Connections: Not on file  Intimate Partner Violence: Not on file    Outpatient Medications Prior to Visit  Medication Sig Dispense Refill  . levothyroxine (SYNTHROID) 50 MCG tablet PLACE 1 TABLET INTO FEEDING TUBE DAILY 90 tablet 0  . loperamide (IMODIUM A-D) 2 MG tablet Place 2 mg into feeding tube 4 (four) times daily as needed for diarrhea or loose stools.    . Multiple  Vitamins-Minerals (ADULT GUMMY PO) Take 2 tablets by mouth daily. Gummy    . omeprazole (PRILOSEC) 20 MG capsule TAKE 1 CAPSULE PER  TUBE  ONCE DAILY 90 capsule 0  . pramipexole (MIRAPEX) 0.125 MG tablet TAKE 1 TO 2 TABLETS BY MOUTH AT NIGHT AS NEEDED FOR RESTLESS LEGS 60 tablet 5   No facility-administered medications prior to visit.  No Known Allergies  ROS Review of Systems  Constitutional: Negative for chills, fatigue and unexpected weight change.  Eyes: Negative for visual disturbance.  Respiratory: Negative for cough, chest tightness, shortness of breath and wheezing.   Cardiovascular: Negative for chest pain, palpitations and leg swelling.  Neurological: Negative for dizziness, seizures, syncope, weakness, light-headedness and headaches.      Objective:    Physical Exam Constitutional:      Appearance: She is well-developed and well-nourished.  Eyes:     Pupils: Pupils are equal, round, and reactive to light.  Neck:     Thyroid: No thyromegaly.     Vascular: No JVD.  Cardiovascular:     Rate and Rhythm: Normal rate and regular rhythm.     Heart sounds: Murmur heard.  No gallop.      Comments: She has soft 2/6 systolic murmur or over aortic valve region Pulmonary:     Effort: Pulmonary effort is normal. No respiratory distress.     Breath sounds: Normal breath sounds. No wheezing or rales.  Musculoskeletal:        General: No edema.     Cervical back: Neck supple.  Neurological:     Mental Status: She is alert.     BP (!) 182/70   Pulse (!) 54   Ht 5\' 2"  (1.575 m)   Wt 188 lb (85.3 kg)   SpO2 98%   BMI 34.39 kg/m  Wt Readings from Last 3 Encounters:  06/15/20 188 lb (85.3 kg)  05/16/20 182 lb 14.4 oz (83 kg)  11/18/19 188 lb 3.2 oz (85.4 kg)     Health Maintenance Due  Topic Date Due  . TETANUS/TDAP  04/21/2016    There are no preventive care reminders to display for this patient.  Lab Results  Component Value Date   TSH 4.93 (H) 02/28/2019    Lab Results  Component Value Date   WBC 6.1 05/16/2020   HGB 12.7 05/16/2020   HCT 40.9 05/16/2020   MCV 83.1 05/16/2020   PLT 217 05/16/2020   Lab Results  Component Value Date   NA 143 05/16/2020   K 4.4 05/16/2020   CHLORIDE 103 04/10/2017   CO2 26 05/16/2020   GLUCOSE 83 05/16/2020   BUN 18 05/16/2020   CREATININE 1.06 (H) 05/16/2020   BILITOT 0.4 05/16/2020   ALKPHOS 66 05/16/2020   AST 18 05/16/2020   ALT 10 05/16/2020   PROT 7.7 05/16/2020   ALBUMIN 3.5 05/16/2020   CALCIUM 9.1 05/16/2020   ANIONGAP 9 05/16/2020   EGFR >60 04/10/2017   GFR 46.37 (L) 03/25/2016   Lab Results  Component Value Date   CHOL 152 03/25/2016   Lab Results  Component Value Date   HDL 49.80 03/25/2016   Lab Results  Component Value Date   LDLCALC 78 03/25/2016   Lab Results  Component Value Date   TRIG 108 02/18/2017   Lab Results  Component Value Date   CHOLHDL 3 03/25/2016   Lab Results  Component Value Date   HGBA1C 5.7 (H) 06/02/2018      Assessment & Plan:   #1 hypothyroidism.  Patient overdue for follow-up labs -Check TSH and free T4  #2 history of hypertension.  Blood pressure is significantly elevated today.  This was repeated twice left arm seated after rest 182/70.  Start back losartan HCTZ 50/12.5 mg one daily and reassess here in 3 to 4 weeks.  We also discussed nonpharmacologic management of hypertension with sodium reduction  and regular exercise and weight loss  #3 aortic murmur.  Previous echocardiogram did not show any significant aortic valve abnormalities  #4 restless leg syndrome-currently poorly controlled on Mirapex 0.25 mg daily -Increase Mirapex to 0.5 mg daily and will touch base for follow-up in 1 month.  Meds ordered this encounter  Medications  . pramipexole (MIRAPEX) 0.5 MG tablet    Sig: Take 1 tablet (0.5 mg total) by mouth at bedtime.    Dispense:  90 tablet    Refill:  3  . losartan-hydrochlorothiazide (HYZAAR) 50-12.5 MG tablet     Sig: Take 1 tablet by mouth daily.    Dispense:  90 tablet    Refill:  3    Follow-up: Return in about 1 month (around 07/13/2020).    Carolann Littler, MD

## 2020-06-15 NOTE — Patient Instructions (Signed)

## 2020-06-17 ENCOUNTER — Encounter: Payer: Self-pay | Admitting: Family Medicine

## 2020-06-18 ENCOUNTER — Other Ambulatory Visit: Payer: Self-pay

## 2020-06-18 ENCOUNTER — Telehealth: Payer: Self-pay | Admitting: Family Medicine

## 2020-06-18 DIAGNOSIS — E039 Hypothyroidism, unspecified: Secondary | ICD-10-CM

## 2020-06-18 MED ORDER — LOSARTAN POTASSIUM 50 MG PO TABS: 50.0000 mg | ORAL_TABLET | Freq: Every day | ORAL | 3 refills | Status: DC

## 2020-06-18 MED ORDER — HYDROCHLOROTHIAZIDE 12.5 MG PO TABS
12.5000 mg | ORAL_TABLET | Freq: Every day | ORAL | 3 refills | Status: DC
Start: 2020-06-18 — End: 2021-09-02

## 2020-06-18 MED ORDER — LEVOTHYROXINE SODIUM 75 MCG PO TABS: 75.0000 ug | ORAL_TABLET | Freq: Every day | ORAL | 3 refills | Status: DC

## 2020-06-18 NOTE — Telephone Encounter (Signed)
Patient husband states the Losartan/HCTZ combo prescription is on backorder.  The pharmacy said that they could fill the medication with separate prescriptions for each medication, just not the one that has both medications in one pill.  Pharmacy- [facility] [place]

## 2020-06-18 NOTE — Telephone Encounter (Signed)
Blood pressure meds refilled.

## 2020-06-18 NOTE — Telephone Encounter (Signed)
Patient spouse called back and stated that pt medication levothyroxine went the wrong pharmacy and needs to go to [facility] [address], please advise. CB is [phone]

## 2020-06-18 NOTE — Telephone Encounter (Signed)
Okay to fill the blood pressure medication as separate prescriptions with losartan and HCTZ

## 2020-06-18 NOTE — Telephone Encounter (Signed)
Levothyroxine refilled.  Please advise on Losartan/HCTZ.

## 2020-07-12 ENCOUNTER — Other Ambulatory Visit: Payer: Self-pay

## 2020-07-13 ENCOUNTER — Encounter: Payer: Self-pay | Admitting: Family Medicine

## 2020-07-13 ENCOUNTER — Ambulatory Visit (INDEPENDENT_AMBULATORY_CARE_PROVIDER_SITE_OTHER): Payer: Medicare Other | Admitting: Family Medicine

## 2020-07-13 VITALS — BP 128/80 | HR 70 | Temp 97.6°F | Ht 62.0 in | Wt 181.6 lb

## 2020-07-13 DIAGNOSIS — E039 Hypothyroidism, unspecified: Secondary | ICD-10-CM | POA: Diagnosis not present

## 2020-07-13 DIAGNOSIS — I10 Essential (primary) hypertension: Secondary | ICD-10-CM

## 2020-07-13 DIAGNOSIS — G2581 Restless legs syndrome: Secondary | ICD-10-CM

## 2020-07-13 NOTE — Patient Instructions (Signed)
Set up repeat thyroid blood tests in about 2 months

## 2020-07-13 NOTE — Progress Notes (Signed)
Established Patient Office Visit  Subjective:  Patient ID: Kristen Escobar, female    DOB: 1945-05-08  Age: 75 y.o. MRN: [id number]  CC:  Chief Complaint  Patient presents with  . Follow-up    HPI Jaisa Defino presents for medical follow-up.  She has chronic problems including hypertension, history of pulmonary embolus, GERD, hypothyroidism, osteoarthritis,, chronic kidney disease, history of breast cancer, and history of lymphoma.  She also has restless leg syndrome.  We recently increased her Mirapex from 0.25 to 0.5 mg and this seems to help.  She is sleeping much better.  Rare breakthrough symptoms at this time.  Hypertension history.  Last visit we had reading 182/70.  We started back losartan HCTZ 50/12.5 mg daily.  Blood pressure much improved today.  She feels better overall.  She has hypothyroidism.  Recent elevated TSH with normal free T4.  We increased her levothyroxine to 75 mcg daily.  On further questioning, she apparently is taking her levothyroxine frequently in the mornings with her omeprazole.  She has history of esophageal stricture and is scheduled to get dilatation soon.  She has to get these every 6 to 8 months generally.  Past Medical History:  Diagnosis Date  . Acute respiratory failure with hypoxia ([place]) 02/16/2017  . Arthritis   . Cancer ([place]) 03/2009   breast- rt  . Complication of anesthesia    Hard to void after knee replacement 05-2018  . 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 GERD (gastroesophageal reflux disease)   . History of radiation therapy 07/12/10,completed   right breast 60 Gy x30 fx  . Hypertension    no meds currently  . Hypothyroidism   . Obesity   . Peripheral vascular disease ([place]) 1995   PT DEVELOPED CIRCULATION PROBLEMS IN BOTH HANDS AND GANGRENE OF BOTH INDEX FINGERS--REQUIRING AMPUTATION OF THE INDEX FINGERS AND VASCULAR SURGERY.  PT TOLD HER PROBLEMS RELATED TO SMOKING.   NO  OTHER PROBLEMS SINCE.  Marland Kitchen Personal history of radiation therapy 2012   Right Breast Cancer  . Pneumonia   . Thyroid mass     Past Surgical History:  Procedure Laterality Date  . AMPUTATION     partial amputation of both index fingers  . BREAST LUMPECTOMY Right 04/01/2010  . BREAST SURGERY  2011   lumpectomy with node sampling- RIGHT  . CATARACT EXTRACTION, BILATERAL  approx 2017  . COLONOSCOPY    . ESOPHAGOGASTRODUODENOSCOPY    . EXCISION MASS NECK Left 12/26/2016   Procedure: EXCISION MASS NECK;  Surgeon: Izora Gala, MD;  Location: [facility];  Service: ENT;  Laterality: Left;  open excision of thyroid mass left side with frozen section  . HAND SURGERY Bilateral 1995   Amputaed pointer fingers bilaterally  . IR FLUORO GUIDE PORT INSERTION RIGHT  01/14/2017  . IR GASTROSTOMY TUBE MOD SED  02/19/2017  . IR GASTROSTOMY TUBE REMOVAL  12/07/2018  . IR PATIENT EVAL TECH 0-60 MINS  01/14/2019  . IR REMOVAL TUN ACCESS W/ PORT W/O FL MOD SED  11/30/2017  . IR REPLACE G-TUBE SIMPLE WO FLUORO  08/03/2017  . IR REPLACE G-TUBE SIMPLE WO FLUORO  06/10/2018  . IR REPLC GASTRO/COLONIC TUBE PERCUT W/FLUORO  02/25/2017  . IR [place] TUBE PERCUT W/FLUORO  03/09/2017  . IR US GUIDE BX ASP/DRAIN  12/07/2018  . IR US GUIDE VASC ACCESS RIGHT  01/14/2017  . JOINT REPLACEMENT    .  LAPAROSCOPIC GASTRIC RESECTION N/A 09/09/2019   Procedure: LAPAROSCOPIC CLOSURE OF GASTRO-CUTANEOUS FISTULA, EXCISION OF FISTUAL TRACK;  Surgeon: Alphonsa Overall, MD;  Location: [facility];  Service: General;  Laterality: N/A;  . TOTAL HIP ARTHROPLASTY  12/16/2011   right hip  . TOTAL HIP ARTHROPLASTY  01/20/2012   Procedure: TOTAL HIP ARTHROPLASTY ANTERIOR APPROACH;  Surgeon: Mauri Pole, MD;  Location: [facility];  Service: Orthopedics;  Laterality: Left;  . TOTAL KNEE ARTHROPLASTY Right 05/31/2018   Procedure: TOTAL KNEE ARTHROPLASTY;  Surgeon: Gaynelle Arabian, MD;  Location: [facility];  Service: Orthopedics;  Laterality: Right;  Adductor  Block  . TUBAL LIGATION    . VASCULAR SURGERY     both hands    Family History  Problem Relation Age of Onset  . Cancer Mother        pt unaware of what kind  . Hearing loss Mother   . Coronary artery disease Father   . Diabetes Father   . Coronary artery disease Brother   . Coronary artery disease Brother   . Diabetes Sister   . Cancer Sister        breast  . Colon cancer Neg Hx   . Stomach cancer Neg Hx   . Rectal cancer Neg Hx   . Esophageal cancer Neg Hx     Social History   Socioeconomic History  . Marital status: Married    Spouse name: Not on file  . Number of children: Not on file  . Years of education: Not on file  . Highest education level: Not on file  Occupational History  . Not on file  Tobacco Use  . Smoking status: Former Smoker    Packs/day: 1.00    Years: 20.00    Pack years: 20.00    Quit date: 04/21/1993    Years since quitting: 27.2  . Smokeless tobacco: Never Used  Vaping Use  . Vaping Use: Never used  Substance and Sexual Activity  . Alcohol use: Not Currently    Comment: OCCAS - MAYBE ONCE A MONTH  . Drug use: No  . Sexual activity: Not Currently  Other Topics Concern  . Not on file  Social History Narrative  . Not on file   Social Determinants of Health   Financial Resource Strain: Not on file  Food Insecurity: Not on file  Transportation Needs: Not on file  Physical Activity: Not on file  Stress: Not on file  Social Connections: Not on file  Intimate Partner Violence: Not on file    Outpatient Medications Prior to Visit  Medication Sig Dispense Refill  . hydrochlorothiazide (HYDRODIURIL) 12.5 MG tablet Take 1 tablet (12.5 mg total) by mouth daily. 90 tablet 3  . levothyroxine (SYNTHROID) 75 MCG tablet Take 1 tablet (75 mcg total) by mouth daily. 90 tablet 3  . loperamide (IMODIUM A-D) 2 MG tablet Place 2 mg into feeding tube 4 (four) times daily as needed for diarrhea or loose stools.    Marland Kitchen losartan (COZAAR) 50 MG tablet Take  1 tablet (50 mg total) by mouth daily. 90 tablet 3  . Multiple Vitamins-Minerals (ADULT GUMMY PO) Take 2 tablets by mouth daily. Gummy    . omeprazole (PRILOSEC) 20 MG capsule TAKE 1 CAPSULE by mouth once daily 90 capsule 3  . pramipexole (MIRAPEX) 0.5 MG tablet Take 1 tablet (0.5 mg total) by mouth at bedtime. 90 tablet 3  . losartan-hydrochlorothiazide (HYZAAR) 50-12.5 MG tablet Take 1 tablet by mouth daily. 90 tablet 3  No facility-administered medications prior to visit.    No Known Allergies  ROS Review of Systems  Constitutional: Negative for fatigue and unexpected weight change.  Eyes: Negative for visual disturbance.  Respiratory: Negative for cough, chest tightness, shortness of breath and wheezing.   Cardiovascular: Negative for chest pain, palpitations and leg swelling.  Neurological: Negative for dizziness, seizures, syncope, weakness, light-headedness and headaches.      Objective:    Physical Exam Vitals reviewed.  Cardiovascular:     Rate and Rhythm: Normal rate and regular rhythm.  Pulmonary:     Effort: Pulmonary effort is normal.     Breath sounds: Normal breath sounds.  Musculoskeletal:     Right lower leg: No edema.     Left lower leg: No edema.  Neurological:     Mental Status: She is alert.     BP 128/80   Pulse 70   Temp 97.6 F (36.4 C) (Oral)   Ht 5' 2" (1.575 m)   Wt 181 lb 9 oz (82.4 kg)   SpO2 97%   BMI 33.21 kg/m  Wt Readings from Last 3 Encounters:  07/13/20 181 lb 9 oz (82.4 kg)  06/15/20 188 lb (85.3 kg)  05/16/20 182 lb 14.4 oz (83 kg)     Health Maintenance Due  Topic Date Due  . TETANUS/TDAP  04/21/2016    There are no preventive care reminders to display for this patient.  Lab Results  Component Value Date   TSH 5.75 (H) 06/15/2020   Lab Results  Component Value Date   WBC 6.1 05/16/2020   HGB 12.7 05/16/2020   HCT 40.9 05/16/2020   MCV 83.1 05/16/2020   PLT 217 05/16/2020   Lab Results  Component Value  Date   NA 143 05/16/2020   K 4.4 05/16/2020   CHLORIDE 103 04/10/2017   CO2 26 05/16/2020   GLUCOSE 83 05/16/2020   BUN 18 05/16/2020   CREATININE 1.06 (H) 05/16/2020   BILITOT 0.4 05/16/2020   ALKPHOS 66 05/16/2020   AST 18 05/16/2020   ALT 10 05/16/2020   PROT 7.7 05/16/2020   ALBUMIN 3.5 05/16/2020   CALCIUM 9.1 05/16/2020   ANIONGAP 9 05/16/2020   EGFR >60 04/10/2017   GFR 46.37 (L) 03/25/2016   Lab Results  Component Value Date   CHOL 152 03/25/2016   Lab Results  Component Value Date   HDL 49.80 03/25/2016   Lab Results  Component Value Date   LDLCALC 78 03/25/2016   Lab Results  Component Value Date   TRIG 108 02/18/2017   Lab Results  Component Value Date   CHOLHDL 3 03/25/2016   Lab Results  Component Value Date   HGBA1C 5.7 (H) 06/02/2018      Assessment & Plan:   #1 hypertension greatly improved with recent addition of losartan HCTZ. -Continue current medication -Try to keep daily sodium intake less than 2400 mg  #2 restless leg syndrome.  Symptomatically improved after increase Mirapex from 0.25 to 0.5 mg daily -Continue Mirapex 0.5 mg nightly  #3 hypothyroidism.  Recently under replaced. -We reviewed importance of taking her thyroid medication on empty stomach and particularly not taking this with omeprazole which could impair absorption. -Future lab order placed for free T4 and TSH in about 2 months  No orders of the defined types were placed in this encounter.   Follow-up: Return in about 6 months (around 01/13/2021).    Carolann Littler, MD

## 2020-07-17 DIAGNOSIS — Z8579 Personal history of other malignant neoplasms of lymphoid, hematopoietic and related tissues: Secondary | ICD-10-CM | POA: Diagnosis not present

## 2020-07-17 DIAGNOSIS — K222 Esophageal obstruction: Secondary | ICD-10-CM | POA: Diagnosis not present

## 2020-07-17 DIAGNOSIS — E039 Hypothyroidism, unspecified: Secondary | ICD-10-CM | POA: Diagnosis not present

## 2020-07-17 DIAGNOSIS — R1313 Dysphagia, pharyngeal phase: Secondary | ICD-10-CM | POA: Diagnosis not present

## 2020-07-17 DIAGNOSIS — K219 Gastro-esophageal reflux disease without esophagitis: Secondary | ICD-10-CM | POA: Diagnosis not present

## 2020-08-15 ENCOUNTER — Telehealth: Payer: Self-pay | Admitting: Family Medicine

## 2020-08-15 NOTE — Telephone Encounter (Signed)
Left message for patient to call back and schedule Medicare Annual Wellness Visit (AWV) either virtually or in office.   Last AWV 06/15/19 please schedule at anytime with [facility] Nurse Health Advisor 1 or 2   This should be a 45 minute visit. 

## 2020-09-11 ENCOUNTER — Other Ambulatory Visit: Payer: Self-pay

## 2020-09-12 ENCOUNTER — Other Ambulatory Visit (INDEPENDENT_AMBULATORY_CARE_PROVIDER_SITE_OTHER): Payer: Medicare Other

## 2020-09-12 DIAGNOSIS — E039 Hypothyroidism, unspecified: Secondary | ICD-10-CM | POA: Diagnosis not present

## 2020-09-12 LAB — TSH: TSH: 0.86 u[IU]/mL (ref 0.35–4.50)

## 2020-09-12 LAB — T4, FREE: Free T4: 0.96 ng/dL (ref 0.60–1.60)

## 2020-09-18 ENCOUNTER — Telehealth: Payer: Self-pay | Admitting: Hematology and Oncology

## 2020-09-18 NOTE — Telephone Encounter (Signed)
R/s per 1/26 los, pt aware.

## 2020-10-01 DIAGNOSIS — L718 Other rosacea: Secondary | ICD-10-CM | POA: Diagnosis not present

## 2020-10-01 DIAGNOSIS — D485 Neoplasm of uncertain behavior of skin: Secondary | ICD-10-CM | POA: Diagnosis not present

## 2020-10-09 ENCOUNTER — Ambulatory Visit: Payer: Medicare Other | Attending: Internal Medicine

## 2020-10-09 ENCOUNTER — Other Ambulatory Visit (HOSPITAL_BASED_OUTPATIENT_CLINIC_OR_DEPARTMENT_OTHER): Payer: Self-pay

## 2020-10-09 ENCOUNTER — Ambulatory Visit: Payer: Self-pay

## 2020-10-09 ENCOUNTER — Ambulatory Visit (INDEPENDENT_AMBULATORY_CARE_PROVIDER_SITE_OTHER): Payer: Medicare Other

## 2020-10-09 DIAGNOSIS — Z0000 Encounter for general adult medical examination without abnormal findings: Secondary | ICD-10-CM

## 2020-10-09 DIAGNOSIS — Z23 Encounter for immunization: Secondary | ICD-10-CM

## 2020-10-09 MED ORDER — PFIZER-BIONT COVID-19 VAC-TRIS 30 MCG/0.3ML IM SUSP
INTRAMUSCULAR | 0 refills | Status: DC
Start: 2020-10-09 — End: 2020-11-06
  Filled 2020-10-09: qty 0.3, 1d supply, fill #0

## 2020-10-09 NOTE — Progress Notes (Signed)
   Covid-19 Vaccination Clinic  Name:  [name]    MRN: [id number] DOB: 1945/10/13  10/09/2020  [name] was observed post Covid-19 immunization for 15 minutes without incident. She was provided with Vaccine Information Sheet and instruction to access the V-Safe system.   [name] was instructed to call 911 with any severe reactions post vaccine: Difficulty breathing  Swelling of face and throat  A fast heartbeat  A bad rash all over body  Dizziness and weakness   Immunizations Administered     Name Date Dose VIS Date Route   PFIZER Comrnaty(Gray TOP) Covid-19 Vaccine 10/09/2020  1:56 PM 0.3 mL 03/29/2020 Intramuscular   Manufacturer: [place]   Lot: [id number]   [place]: [phone]

## 2020-10-09 NOTE — Patient Instructions (Signed)
Kristen Escobar , Thank you for taking time to come for your Medicare Wellness Visit. I appreciate your ongoing commitment to your health goals. Please review the following plan we discussed and let me know if I can assist you in the future.   Screening recommendations/referrals: Colonoscopy: Due 02/11/2021 Mammogram: due 04/2021 Bone Density: due 04/2021 Recommended yearly ophthalmology/optometry visit for glaucoma screening and checkup Recommended yearly dental visit for hygiene and checkup  Vaccinations: Influenza vaccine: due in fall 2022 Pneumococcal vaccine: completed series Tdap vaccine: currently due  Shingles vaccine: declined  Advanced directives: will provide copies   Conditions/risks identified: none   Next appointment:none    Preventive Care 53 Years and Older, Female Preventive care refers to lifestyle choices and visits with your health care provider that can promote health and wellness. What does preventive care include? A yearly physical exam. This is also called an annual well check. Dental exams once or twice a year. Routine eye exams. Ask your health care provider how often you should have your eyes checked. Personal lifestyle choices, including: Daily care of your teeth and gums. Regular physical activity. Eating a healthy diet. Avoiding tobacco and drug use. Limiting alcohol use. Practicing safe sex. Taking low-dose aspirin every day. Taking vitamin and mineral supplements as recommended by your health care provider. What happens during an annual well check? The services and screenings done by your health care provider during your annual well check will depend on your age, overall health, lifestyle risk factors, and family history of disease. Counseling  Your health care provider may ask you questions about your: Alcohol use. Tobacco use. Drug use. Emotional well-being. Home and relationship well-being. Sexual activity. Eating habits. History of  falls. Memory and ability to understand (cognition). Work and work Statistician. Reproductive health. Screening  You may have the following tests or measurements: Height, weight, and BMI. Blood pressure. Lipid and cholesterol levels. These may be checked every 5 years, or more frequently if you are over 70 years old. Skin check. Lung cancer screening. You may have this screening every year starting at age 26 if you have a 30-pack-year history of smoking and currently smoke or have quit within the past 15 years. Fecal occult blood test (FOBT) of the stool. You may have this test every year starting at age 3. Flexible sigmoidoscopy or colonoscopy. You may have a sigmoidoscopy every 5 years or a colonoscopy every 10 years starting at age 3. Hepatitis C blood test. Hepatitis B blood test. Sexually transmitted disease (STD) testing. Diabetes screening. This is done by checking your blood sugar (glucose) after you have not eaten for a while (fasting). You may have this done every 1-3 years. Bone density scan. This is done to screen for osteoporosis. You may have this done starting at age 18. Mammogram. This may be done every 1-2 years. Talk to your health care provider about how often you should have regular mammograms. Talk with your health care provider about your test results, treatment options, and if necessary, the need for more tests. Vaccines  Your health care provider may recommend certain vaccines, such as: Influenza vaccine. This is recommended every year. Tetanus, diphtheria, and acellular pertussis (Tdap, Td) vaccine. You may need a Td booster every 10 years. Zoster vaccine. You may need this after age 15. Pneumococcal 13-valent conjugate (PCV13) vaccine. One dose is recommended after age 68. Pneumococcal polysaccharide (PPSV23) vaccine. One dose is recommended after age 57. Talk to your health care provider about which screenings and vaccines you  need and how often you need  them. This information is not intended to replace advice given to you by your health care provider. Make sure you discuss any questions you have with your health care provider. Document Released: 05/04/2015 Document Revised: 12/26/2015 Document Reviewed: 02/06/2015 Elsevier Interactive Patient Education  2017 [place] Prevention in the Home Falls can cause injuries. They can happen to people of all ages. There are many things you can do to make your home safe and to help prevent falls. What can I do on the outside of my home? Regularly fix the edges of walkways and driveways and fix any cracks. Remove anything that might make you trip as you walk through a door, such as a raised step or threshold. Trim any bushes or trees on the path to your home. Use bright outdoor lighting. Clear any walking paths of anything that might make someone trip, such as rocks or tools. Regularly check to see if handrails are loose or broken. Make sure that both sides of any steps have handrails. Any raised decks and porches should have guardrails on the edges. Have any leaves, snow, or ice cleared regularly. Use sand or salt on walking paths during winter. Clean up any spills in your garage right away. This includes oil or grease spills. What can I do in the bathroom? Use night lights. Install grab bars by the toilet and in the tub and shower. Do not use towel bars as grab bars. Use non-skid mats or decals in the tub or shower. If you need to sit down in the shower, use a plastic, non-slip stool. Keep the floor dry. Clean up any water that spills on the floor as soon as it happens. Remove soap buildup in the tub or shower regularly. Attach bath mats securely with double-sided non-slip rug tape. Do not have throw rugs and other things on the floor that can make you trip. What can I do in the bedroom? Use night lights. Make sure that you have a light by your bed that is easy to reach. Do not use  any sheets or blankets that are too big for your bed. They should not hang down onto the floor. Have a firm chair that has side arms. You can use this for support while you get dressed. Do not have throw rugs and other things on the floor that can make you trip. What can I do in the kitchen? Clean up any spills right away. Avoid walking on wet floors. Keep items that you use a lot in easy-to-reach places. If you need to reach something above you, use a strong step stool that has a grab bar. Keep electrical cords out of the way. Do not use floor polish or wax that makes floors slippery. If you must use wax, use non-skid floor wax. Do not have throw rugs and other things on the floor that can make you trip. What can I do with my stairs? Do not leave any items on the stairs. Make sure that there are handrails on both sides of the stairs and use them. Fix handrails that are broken or loose. Make sure that handrails are as long as the stairways. Check any carpeting to make sure that it is firmly attached to the stairs. Fix any carpet that is loose or worn. Avoid having throw rugs at the top or bottom of the stairs. If you do have throw rugs, attach them to the floor with carpet tape. Make sure that  you have a light switch at the top of the stairs and the bottom of the stairs. If you do not have them, ask someone to add them for you. What else can I do to help prevent falls? Wear shoes that: Do not have high heels. Have rubber bottoms. Are comfortable and fit you well. Are closed at the toe. Do not wear sandals. If you use a stepladder: Make sure that it is fully opened. Do not climb a closed stepladder. Make sure that both sides of the stepladder are locked into place. Ask someone to hold it for you, if possible. Clearly mark and make sure that you can see: Any grab bars or handrails. First and last steps. Where the edge of each step is. Use tools that help you move around (mobility aids)  if they are needed. These include: Canes. Walkers. Scooters. Crutches. Turn on the lights when you go into a dark area. Replace any light bulbs as soon as they burn out. Set up your furniture so you have a clear path. Avoid moving your furniture around. If any of your floors are uneven, fix them. If there are any pets around you, be aware of where they are. Review your medicines with your doctor. Some medicines can make you feel dizzy. This can increase your chance of falling. Ask your doctor what other things that you can do to help prevent falls. This information is not intended to replace advice given to you by your health care provider. Make sure you discuss any questions you have with your health care provider. Document Released: 02/01/2009 Document Revised: 09/13/2015 Document Reviewed: 05/12/2014 Elsevier Interactive Patient Education  2017 [employer].

## 2020-10-09 NOTE — Progress Notes (Signed)
Subjective:   Kristen Escobar is a 75 y.o. female who presents for an Initial Medicare Annual Wellness Visit. I connected with Violette Morneault today by telephone and verified that I am speaking with the correct person using two identifiers. Location patient: home Location provider: work Persons participating in the virtual visit: patient, provider.   I discussed the limitations, risks, security and privacy concerns of performing an evaluation and management service by telephone and the availability of in person appointments. I also discussed with the patient that there may be a patient responsible charge related to this service. The patient expressed understanding and verbally consented to this telephonic visit.    Interactive audio and video telecommunications were attempted between this provider and patient, however failed, due to patient having technical difficulties OR patient did not have access to video capability.  We continued and completed visit with audio only.     Review of Systems    N/a       Objective:    There were no vitals filed for this visit. There is no height or weight on file to calculate BMI.  Advanced Directives 09/09/2019 09/02/2019 06/15/2019 12/07/2018 06/03/2018 05/31/2018 05/26/2018  Does Patient Have a Medical Advance Directive? Yes Yes Yes Yes No Yes Yes  Type of Paramedic of [place];Living will [place];Living will Living will;Healthcare Power of [place];Living will - [place];Living will [place];Living will  Does patient want to make changes to medical advance directive? No - Patient declined No - Patient declined No - Patient declined No - Patient declined - No - Patient declined No - Patient declined  Copy of [place] in Chart? No - copy requested No - copy requested No - copy requested No - copy requested - No - copy  requested No - copy requested  Would patient like information on creating a medical advance directive? - - - - - - -  Pre-existing out of facility DNR order (yellow form or pink MOST form) - - - - - - -    Current Medications (verified) Outpatient Encounter Medications as of 10/09/2020  Medication Sig   hydrochlorothiazide (HYDRODIURIL) 12.5 MG tablet Take 1 tablet (12.5 mg total) by mouth daily.   levothyroxine (SYNTHROID) 75 MCG tablet Take 1 tablet (75 mcg total) by mouth daily.   loperamide (IMODIUM A-D) 2 MG tablet Place 2 mg into feeding tube 4 (four) times daily as needed for diarrhea or loose stools.   losartan (COZAAR) 50 MG tablet Take 1 tablet (50 mg total) by mouth daily.   Multiple Vitamins-Minerals (ADULT GUMMY PO) Take 2 tablets by mouth daily. Gummy   omeprazole (PRILOSEC) 20 MG capsule TAKE 1 CAPSULE by mouth once daily   pramipexole (MIRAPEX) 0.5 MG tablet Take 1 tablet (0.5 mg total) by mouth at bedtime.   No facility-administered encounter medications on file as of 10/09/2020.    Allergies (verified) Patient has no known allergies.   History: Past Medical History:  Diagnosis Date   Acute respiratory failure with hypoxia ([place]) 02/16/2017   Arthritis    Cancer ([place]) 03/2009   breast- rt   Complication of anesthesia    Hard to void after knee replacement 05-2018   Diffuse large B cell lymphoma ([place]) dx'd 02/17/17   in remission 2 years now   had chemo    GERD (gastroesophageal reflux disease)    History of radiation therapy 07/12/10,completed  right breast 60 Gy x30 fx   Hypertension    no meds currently   Hypothyroidism    Obesity    Peripheral vascular disease ([place]) 1995   PT DEVELOPED CIRCULATION PROBLEMS IN BOTH HANDS AND GANGRENE OF BOTH INDEX FINGERS--REQUIRING AMPUTATION OF THE INDEX FINGERS AND VASCULAR SURGERY.  PT TOLD HER PROBLEMS RELATED TO SMOKING.   NO OTHER PROBLEMS SINCE.   Personal history of radiation therapy 2012   Right Breast Cancer    Pneumonia    Thyroid mass    Past Surgical History:  Procedure Laterality Date   AMPUTATION     partial amputation of both index fingers   BREAST LUMPECTOMY Right 04/01/2010   BREAST SURGERY  2011   lumpectomy with node sampling- RIGHT   CATARACT EXTRACTION, BILATERAL  approx 2017   COLONOSCOPY     ESOPHAGOGASTRODUODENOSCOPY     EXCISION MASS NECK Left 12/26/2016   Procedure: EXCISION MASS NECK;  Surgeon: Izora Gala, MD;  Location: [facility];  Service: ENT;  Laterality: Left;  open excision of thyroid mass left side with frozen section   HAND SURGERY Bilateral 1995   Amputaed pointer fingers bilaterally   IR FLUORO GUIDE PORT INSERTION RIGHT  01/14/2017   IR GASTROSTOMY TUBE MOD SED  02/19/2017   IR GASTROSTOMY TUBE REMOVAL  12/07/2018   IR PATIENT EVAL TECH 0-60 MINS  01/14/2019   IR REMOVAL TUN ACCESS W/ PORT W/O FL MOD SED  11/30/2017   IR REPLACE G-TUBE SIMPLE WO FLUORO  08/03/2017   IR REPLACE G-TUBE SIMPLE WO FLUORO  06/10/2018   IR REPLC GASTRO/COLONIC TUBE PERCUT W/FLUORO  02/25/2017   IR REPLC GASTRO/COLONIC TUBE PERCUT W/FLUORO  03/09/2017   IR US GUIDE BX ASP/DRAIN  12/07/2018   IR US GUIDE VASC ACCESS RIGHT  01/14/2017   JOINT REPLACEMENT     LAPAROSCOPIC GASTRIC RESECTION N/A 09/09/2019   Procedure: LAPAROSCOPIC CLOSURE OF GASTRO-CUTANEOUS FISTULA, EXCISION OF FISTUAL TRACK;  Surgeon: Alphonsa Overall, MD;  Location: [facility];  Service: General;  Laterality: N/A;   TOTAL HIP ARTHROPLASTY  12/16/2011   right hip   TOTAL HIP ARTHROPLASTY  01/20/2012   Procedure: TOTAL HIP ARTHROPLASTY ANTERIOR APPROACH;  Surgeon: Mauri Pole, MD;  Location: [facility];  Service: Orthopedics;  Laterality: Left;   TOTAL KNEE ARTHROPLASTY Right 05/31/2018   Procedure: TOTAL KNEE ARTHROPLASTY;  Surgeon: Gaynelle Arabian, MD;  Location: [facility];  Service: Orthopedics;  Laterality: Right;  Adductor Block   TUBAL LIGATION     VASCULAR SURGERY     both hands   Family History  Problem Relation Age of Onset   Cancer  Mother        pt unaware of what kind   Hearing loss Mother    Coronary artery disease Father    Diabetes Father    Coronary artery disease Brother    Coronary artery disease Brother    Diabetes Sister    Cancer Sister        breast   Colon cancer Neg Hx    Stomach cancer Neg Hx    Rectal cancer Neg Hx    Esophageal cancer Neg Hx    Social History   Socioeconomic History   Marital status: Married    Spouse name: Not on file   Number of children: Not on file   Years of education: Not on file   Highest education level: Not on file  Occupational History   Not on file  Tobacco Use  Smoking status: Former    Packs/day: 1.00    Years: 20.00    Pack years: 20.00    Types: Cigarettes    Quit date: 04/21/1993    Years since quitting: 27.4   Smokeless tobacco: Never  Vaping Use   Vaping Use: Never used  Substance and Sexual Activity   Alcohol use: Not Currently    Comment: OCCAS - MAYBE ONCE A MONTH   Drug use: No   Sexual activity: Not Currently  Other Topics Concern   Not on file  Social History Narrative   Not on file   Social Determinants of Health   Financial Resource Strain: Not on file  Food Insecurity: Not on file  Transportation Needs: Not on file  Physical Activity: Not on file  Stress: Not on file  Social Connections: Not on file    Tobacco Counseling Counseling given: Not Answered   Clinical Intake:                 Diabetic?no         Activities of Daily Living No flowsheet data found.  Patient Care Team: Eulas Post, MD as PCP - General (Family Medicine) Marcy Panning, MD as Consulting Physician (Internal Medicine) Thea Silversmith, MD as Consulting Physician (Radiation Oncology) Nicholas Lose, MD as Consulting Physician (Hematology and Oncology) Gaynelle Arabian, MD as Consulting Physician (Orthopedic Surgery) Danella Sensing, MD as Consulting Physician (Dermatology) Melissa Noon, [place] as Consulting Physician  (Optometry) Ernestine Conrad, MD as Consulting Physician (Otolaryngology)  Indicate any recent Medical Services you may have received from other than [facility] providers in the past year (date may be approximate).     Assessment:   This is a routine wellness examination for [place].  Hearing/Vision screen No results found.  Dietary issues and exercise activities discussed:     Goals Addressed   None    Depression Screen PHQ 2/9 Scores 06/15/2020 06/15/2019 08/16/2018 03/25/2016 02/21/2015 02/06/2014 12/10/2012  PHQ - 2 Score 0 0 0 0 0 0 0  PHQ- 9 Score 2 - - - - - -    Fall Risk Fall Risk  06/15/2020 06/15/2019 11/18/2017 03/28/2016 03/25/2016  Falls in the past year? 0 0 No No Yes  Comment - - Emmi Telephone Survey: data to providers prior to load Emmi Telephone Survey: data to providers prior to load -  Number falls in past yr: - 0 - - -  Injury with Fall? - 0 - - -  Follow up - Falls evaluation completed;Education provided;Falls prevention discussed - - -    FALL RISK PREVENTION PERTAINING TO THE HOME:  Any stairs in or around the home? No  If so, are there any without handrails? No  Home free of loose throw rugs in walkways, pet beds, electrical cords, etc? Yes  Adequate lighting in your home to reduce risk of falls? Yes   ASSISTIVE DEVICES UTILIZED TO PREVENT FALLS:  Life alert? No  Use of a cane, walker or w/c? No  Grab bars in the bathroom? No  Shower chair or bench in shower? No  Elevated toilet seat or a handicapped toilet? No    Cognitive Function:  Normal cognitive status assessed by direct observation by this Nurse Health Advisor. No abnormalities found.     6CIT Screen 06/15/2019  What Year? 0 points  What month? 0 points  What time? 0 points  Count back from 20 0 points  Months in reverse 0 points  Repeat phrase 0 points  Total Score 0    Immunizations Immunization History  Administered Date(s) Administered   Fluad Quad(high Dose 65+) 01/15/2019,  02/08/2020   Influenza Split 02/10/2012   Influenza, High Dose Seasonal PF 03/01/2013, 02/06/2014, 02/21/2015, 01/28/2016, 01/08/2017, 01/25/2018   PFIZER(Purple Top)SARS-COV-2 Vaccination 05/08/2019, 05/27/2019, 03/03/2020   Pneumococcal Conjugate-13 02/20/2014   Pneumococcal Polysaccharide-23 09/24/2011   Unspecified SARS-COV-2 Vaccination 05/27/2019   Zoster, Live 05/30/2015    TDAP status: Due, Education has been provided regarding the importance of this vaccine. Advised may receive this vaccine at local pharmacy or Health Dept. Aware to provide a copy of the vaccination record if obtained from local pharmacy or Health Dept. Verbalized acceptance and understanding.  Flu Vaccine status: Up to date  Pneumococcal vaccine status: Up to date  Covid-19 vaccine status: Completed vaccines  Qualifies for Shingles Vaccine? Yes   Zostavax completed No   Shingrix Completed?: No.    Education has been provided regarding the importance of this vaccine. Patient has been advised to call insurance company to determine out of pocket expense if they have not yet received this vaccine. Advised may also receive vaccine at local pharmacy or Health Dept. Verbalized acceptance and understanding.  Screening Tests Health Maintenance  Topic Date Due   Zoster Vaccines- Shingrix (1 of 2) Never done   TETANUS/TDAP  04/21/2016   COVID-19 Vaccine (4 - Booster for Pfizer series) 06/03/2020   INFLUENZA VACCINE  11/19/2020   COLONOSCOPY (Pts 45-76yrs Insurance coverage will need to be confirmed)  02/13/2021   MAMMOGRAM  05/01/2021   DEXA SCAN  Completed   Hepatitis C Screening  Completed   PNA vac Low Risk Adult  Completed   HPV VACCINES  Aged Out    Health Maintenance  Health Maintenance Due  Topic Date Due   Zoster Vaccines- Shingrix (1 of 2) Never done   TETANUS/TDAP  04/21/2016   COVID-19 Vaccine (4 - Booster for Pfizer series) 06/03/2020    Colorectal cancer screening: Type of screening:  Colonoscopy. Completed 02/12/2016. Repeat every 5 years  Mammogram status: Completed 05/01/2021. Repeat every year  Bone Density status: Completed 05/02/2015. Results reflect: Bone density results: NORMAL. Repeat every 2 years.  Lung Cancer Screening: (Low Dose CT Chest recommended if Age 56-80 years, 30 pack-year currently smoking OR have quit w/in 15years.) does not qualify.   Lung Cancer Screening Referral: n/a  Additional Screening:  Hepatitis C Screening: does not qualify; Completed 01/06/2017  Vision Screening: Recommended annual ophthalmology exams for early detection of glaucoma and other disorders of the eye. Is the patient up to date with their annual eye exam?  Yes  Who is the provider or what is the name of the office in which the patient attends annual eye exams? Dr. Delman Cheadle If pt is not established with a provider, would they like to be referred to a provider to establish care? No .   Dental Screening: Recommended annual dental exams for proper oral hygiene  Community Resource Referral / Chronic Care Management: CRR required this visit?  No   CCM required this visit?  No      Plan:     I have personally reviewed and noted the following in the patient's chart:   Medical and social history Use of alcohol, tobacco or illicit drugs  Current medications and supplements including opioid prescriptions. Patient is not currently taking opioid prescriptions. Functional ability and status Nutritional status Physical activity Advanced directives List of other physicians Hospitalizations, surgeries, and ER visits in previous 12 months Vitals  Screenings to include cognitive, depression, and falls Referrals and appointments  In addition, I have reviewed and discussed with patient certain preventive protocols, quality metrics, and best practice recommendations. A written personalized care plan for preventive services as well as general preventive health recommendations were  provided to patient.     Randel Pigg, LPN   9/75/3005   Nurse Notes: none

## 2020-10-24 DIAGNOSIS — R1314 Dysphagia, pharyngoesophageal phase: Secondary | ICD-10-CM | POA: Diagnosis not present

## 2020-10-24 DIAGNOSIS — K222 Esophageal obstruction: Secondary | ICD-10-CM | POA: Diagnosis not present

## 2020-10-24 DIAGNOSIS — J3800 Paralysis of vocal cords and larynx, unspecified: Secondary | ICD-10-CM | POA: Diagnosis not present

## 2020-10-31 DIAGNOSIS — R1313 Dysphagia, pharyngeal phase: Secondary | ICD-10-CM | POA: Diagnosis not present

## 2020-11-05 NOTE — Progress Notes (Signed)
Patient Care Team: Eulas Post, MD as PCP - General (Family Medicine) Marcy Panning, MD as Consulting Physician (Internal Medicine) Thea Silversmith, MD as Consulting Physician (Radiation Oncology) Nicholas Lose, MD as Consulting Physician (Hematology and Oncology) Gaynelle Arabian, MD as Consulting Physician (Orthopedic Surgery) Danella Sensing, MD as Consulting Physician (Dermatology) Melissa Noon, [place] as Consulting Physician (Optometry) Ernestine Conrad, MD as Consulting Physician (Otolaryngology)  DIAGNOSIS:    ICD-10-CM   1. 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 Radiation recall related to tamoxifen    11/25/2010 -  Anti-estrogen oral therapy   Aromasin 25 mg daily.myalgias and arthralgias stop November 2012 went back to tamoxifen 20 mg daily    01/06/2017 Pathology Results   Bone marrow biopsy: Slightly hypercellular bone marrow for age with granulocytic hyperplasia, numerous small lymphoid aggregates present. This may represent minimal involvement by low-grade B-cell lymphoma or liver process like SLL/CLL because they are CD5 positive    Diffuse large B cell lymphoma ([place])  12/26/2016 Initial Diagnosis   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 with above-mentioned history of breast cancer and large B cell lymphoma having undergone a right lumpectomy, chemotherapy, and radiation therapy. She presents to the clinic today for follow-up.  She is doing extremely well from strength standpoint.  She is slowly trying to lose some weight and she exercises every day at the [facility] and also keeps her husband on her husband on his toes by walking and exercising regularly.  Denies any lumps or nodules.  She continues to have occasional problems with swallowing for which she sees GI and she gets stretching done periodically.  ALLERGIES:  has no allergies on file.  MEDICATIONS:  Current Outpatient Medications  Medication Sig Dispense Refill   COVID-19 mRNA Vac-TriS, Pfizer, (PFIZER-BIONT COVID-19 VAC-TRIS) SUSP injection Inject into the muscle. 0.3 mL 0   hydrochlorothiazide (HYDRODIURIL) 12.5 MG tablet Take 1 tablet (12.5 mg total) by mouth daily. 90 tablet 3   levothyroxine (SYNTHROID) 75 MCG tablet Take 1 tablet (75 mcg total) by mouth daily. 90 tablet 3   loperamide (IMODIUM A-D) 2 MG tablet Place 2 mg into feeding tube 4 (four) times daily as needed for diarrhea or loose stools.     losartan (COZAAR) 50 MG tablet Take 1 tablet (50 mg total) by mouth daily. 90 tablet 3   Multiple Vitamins-Minerals (ADULT GUMMY PO) Take 2 tablets by mouth daily. Gummy  omeprazole (PRILOSEC) 20 MG capsule TAKE 1 CAPSULE by mouth once daily 90 capsule 3   pramipexole (MIRAPEX) 0.5 MG tablet Take 1 tablet (0.5 mg total) by mouth at bedtime. 90 tablet 3   No current facility-administered medications  for this visit.    PHYSICAL EXAMINATION: ECOG PERFORMANCE STATUS: 1 - Symptomatic but completely ambulatory  Vitals:   11/06/20 1117  BP: (!) 131/43  Pulse: 68  Resp: 18  Temp: 97.7 F (36.5 C)  SpO2: 97%   Filed Weights   11/06/20 1117  Weight: 174 lb (78.9 kg)     LABORATORY DATA:  I have reviewed the data as listed CMP Latest Ref Rng & Units 05/16/2020 11/16/2019 09/02/2019  Glucose 70 - 99 mg/dL 83 87 94  BUN 8 - 23 mg/dL 18 14 25(H)  Creatinine 0.44 - 1.00 mg/dL 1.06(H) 0.90 0.96  Sodium 135 - 145 mmol/L 143 141 137  Potassium 3.5 - 5.1 mmol/L 4.4 4.5 4.9  Chloride 98 - 111 mmol/L 108 107 101  CO2 22 - 32 mmol/L _0 Calcium 8.9 - 10.3 mg/dL 9.1 9.8 8.7(L)  Total Protein 6.5 - 8.1 g/dL 7.7 7.6 7.7  Total Bilirubin 0.3 - 1.2 mg/dL 0.4 0.6 0.7  Alkaline Phos 38 - 126 U/L 66 72 63  AST 15 - 41 U/L _1 ALT 0 - 44 U/L _2 Lab Results  Component Value Date   WBC 9.7 11/06/2020   HGB 12.4 11/06/2020   HCT 37.4 11/06/2020   MCV 84.0 11/06/2020   PLT 220 11/06/2020   NEUTROABS 6.7 11/06/2020    ASSESSMENT & PLAN:  Diffuse large B cell lymphoma (H[facility]) 12/26/2016 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 positive for LCA, CD20, CD79a PAX-5, BCL-6 and weak cytoplasmic kappa associated with patchy weak positivity for BCL-2 and CD10, Ki-67 40%   Stage IIb (bilateral cervical, esophageal, mediastinal, supraclavicular nodes)   Bone marrow biopsy negative for diffuse large B cell lymphoma but suggestive of small low-grade B cell population possibly SLL/CLL   Revised IPI score: 1, good prognosis, redictated for years PFS 80%, predicted overall survival 79% ------------------------------------------------------------------------ Prior treatment: R CHOP X 5 cycles completed 04/10/2017   TE Fistula: Repaired   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  Completed November  2019. 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 PET CT scan 11/11/2018: No evidence of lymphoma on the scan.  Hypermetabolic activity around the PEG tube site. CT chest with contrast November 16, 2019: No acute cardiopulmonary abnormalities no residual lymphoma.  Emphysema, coronary calcifications, gallstones  Scans to be done on an as-needed basis.  Intermittent stretching of the esophagus to prevent food getting stuck in her throat. Return to clinic in 6 months with labs and follow-up    No orders of the defined types were placed in this encounter.  The patient has a good understanding of the overall plan. she agrees with it. she will call with any problems that may develop before the next visit here.  Total time spent: 30 mins including face to face time and time spent for planning, charting and coordination of care  Rulon Eisenmenger, MD, MPH 11/06/2020  I, Thana Ates, am acting as scribe for Dr. Nicholas Lose.  I have reviewed the above documentation for accuracy and completeness, and I agree with the above.

## 2020-11-06 ENCOUNTER — Inpatient Hospital Stay (HOSPITAL_BASED_OUTPATIENT_CLINIC_OR_DEPARTMENT_OTHER): Payer: Medicare Other | Admitting: Hematology and Oncology

## 2020-11-06 ENCOUNTER — Other Ambulatory Visit: Payer: Self-pay

## 2020-11-06 ENCOUNTER — Inpatient Hospital Stay: Payer: Medicare Other | Attending: Hematology and Oncology

## 2020-11-06 DIAGNOSIS — C8330 Diffuse large B-cell lymphoma, unspecified site: Secondary | ICD-10-CM

## 2020-11-06 DIAGNOSIS — C50411 Malignant neoplasm of upper-outer quadrant of right female breast: Secondary | ICD-10-CM | POA: Diagnosis not present

## 2020-11-06 DIAGNOSIS — Z79899 Other long term (current) drug therapy: Secondary | ICD-10-CM | POA: Diagnosis not present

## 2020-11-06 DIAGNOSIS — Z170 Estrogen receptor positive status [ER+]: Secondary | ICD-10-CM | POA: Diagnosis not present

## 2020-11-06 DIAGNOSIS — Z79811 Long term (current) use of aromatase inhibitors: Secondary | ICD-10-CM | POA: Insufficient documentation

## 2020-11-06 LAB — CMP (CANCER CENTER ONLY)
ALT: 9 U/L (ref 0–44)
AST: 18 U/L (ref 15–41)
Albumin: 3.5 g/dL (ref 3.5–5.0)
Alkaline Phosphatase: 64 U/L (ref 38–126)
Anion gap: 8 (ref 5–15)
BUN: 29 mg/dL — ABNORMAL HIGH (ref 8–23)
CO2: 28 mmol/L (ref 22–32)
Calcium: 9.5 mg/dL (ref 8.9–10.3)
Chloride: 105 mmol/L (ref 98–111)
Creatinine: 1.3 mg/dL — ABNORMAL HIGH (ref 0.44–1.00)
GFR, Estimated: 43 mL/min — ABNORMAL LOW (ref >60)
Glucose, Bld: 102 mg/dL — ABNORMAL HIGH (ref 70–99)
Potassium: 4.6 mmol/L (ref 3.5–5.1)
Sodium: 141 mmol/L (ref 135–145)
Total Bilirubin: 0.5 mg/dL (ref 0.3–1.2)
Total Protein: 7.5 g/dL (ref 6.5–8.1)

## 2020-11-06 LAB — CBC WITH DIFFERENTIAL (CANCER CENTER ONLY)
Abs Immature Granulocytes: 0.02 10*3/uL (ref 0.00–0.07)
Basophils Absolute: 0.1 10*3/uL (ref 0.0–0.1)
Basophils Relative: 1 %
Eosinophils Absolute: 0.3 10*3/uL (ref 0.0–0.5)
Eosinophils Relative: 3 %
HCT: 37.4 % (ref 36.0–46.0)
Hemoglobin: 12.4 g/dL (ref 12.0–15.0)
Immature Granulocytes: 0 %
Lymphocytes Relative: 21 %
Lymphs Abs: 2 10*3/uL (ref 0.7–4.0)
MCH: 27.9 pg (ref 26.0–34.0)
MCHC: 33.2 g/dL (ref 30.0–36.0)
MCV: 84 fL (ref 80.0–100.0)
Monocytes Absolute: 0.6 10*3/uL (ref 0.1–1.0)
Monocytes Relative: 7 %
Neutro Abs: 6.7 10*3/uL (ref 1.7–7.7)
Neutrophils Relative %: 68 %
Platelet Count: 220 10*3/uL (ref 150–400)
RBC: 4.45 MIL/uL (ref 3.87–5.11)
RDW: 14.2 % (ref 11.5–15.5)
WBC Count: 9.7 10*3/uL (ref 4.0–10.5)
nRBC: 0 % (ref 0.0–0.2)

## 2020-11-06 LAB — LACTATE DEHYDROGENASE: LDH: 200 U/L — ABNORMAL HIGH (ref 98–192)

## 2020-11-06 NOTE — Assessment & Plan Note (Signed)
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 CompletedNovember 2019. 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 PET CT scan 11/11/2018:No evidence of lymphoma on the scan. Hypermetabolic activity around the PEG tube site. CT chest with contrast November 16, 2019: No acute cardiopulmonary abnormalities no residual lymphoma. Emphysema, coronary calcifications, gallstones  Scans to be done on an as-needed basis. Return to clinic in 6 months with labs and follow-up

## 2020-11-07 DIAGNOSIS — R1313 Dysphagia, pharyngeal phase: Secondary | ICD-10-CM | POA: Diagnosis not present

## 2020-11-13 ENCOUNTER — Ambulatory Visit: Payer: Medicare Other | Admitting: Hematology and Oncology

## 2020-11-13 ENCOUNTER — Other Ambulatory Visit: Payer: Medicare Other

## 2020-11-21 DIAGNOSIS — R1310 Dysphagia, unspecified: Secondary | ICD-10-CM | POA: Diagnosis not present

## 2020-11-21 DIAGNOSIS — K222 Esophageal obstruction: Secondary | ICD-10-CM | POA: Diagnosis not present

## 2020-12-06 DIAGNOSIS — H524 Presbyopia: Secondary | ICD-10-CM | POA: Diagnosis not present

## 2020-12-06 DIAGNOSIS — H0014 Chalazion left upper eyelid: Secondary | ICD-10-CM | POA: Diagnosis not present

## 2020-12-06 DIAGNOSIS — Z961 Presence of intraocular lens: Secondary | ICD-10-CM | POA: Diagnosis not present

## 2021-01-29 ENCOUNTER — Other Ambulatory Visit: Payer: Self-pay

## 2021-01-30 ENCOUNTER — Ambulatory Visit (INDEPENDENT_AMBULATORY_CARE_PROVIDER_SITE_OTHER): Payer: Medicare Other

## 2021-01-30 DIAGNOSIS — Z23 Encounter for immunization: Secondary | ICD-10-CM

## 2021-02-13 ENCOUNTER — Encounter: Payer: Self-pay | Admitting: Family Medicine

## 2021-02-22 ENCOUNTER — Other Ambulatory Visit: Payer: Self-pay

## 2021-02-22 ENCOUNTER — Ambulatory Visit: Payer: Medicare Other | Attending: Internal Medicine

## 2021-02-22 ENCOUNTER — Other Ambulatory Visit (HOSPITAL_BASED_OUTPATIENT_CLINIC_OR_DEPARTMENT_OTHER): Payer: Self-pay

## 2021-02-22 DIAGNOSIS — Z23 Encounter for immunization: Secondary | ICD-10-CM

## 2021-02-22 MED ORDER — PFIZER COVID-19 VAC BIVALENT 30 MCG/0.3ML IM SUSP
INTRAMUSCULAR | 0 refills | Status: DC
  Filled 2021-02-22: qty 0.3, 1d supply, fill #0

## 2021-02-22 NOTE — Progress Notes (Signed)
   Covid-19 Vaccination Clinic  Name:  Kristen Escobar    MRN: [id number] DOB: [birth year]  02/22/2021  Kristen Escobar was observed post Covid-19 immunization for 15 minutes without incident. She was provided with Vaccine Information Sheet and instruction to access the V-Safe system.   Kristen Escobar was instructed to call 911 with any severe reactions post vaccine: Difficulty breathing  Swelling of face and throat  A fast heartbeat  A bad rash all over body  Dizziness and weakness   Immunizations Administered     Name Date Dose VIS Date Route   Pfizer Covid-19 Vaccine Bivalent Booster 02/22/2021 10:34 AM 0.3 mL 12/19/2020 Intramuscular   Manufacturer: [place]   Lot: [id number]   [place]: [phone]

## 2021-04-18 ENCOUNTER — Encounter: Payer: Self-pay | Admitting: Family Medicine

## 2021-04-18 ENCOUNTER — Other Ambulatory Visit: Payer: Self-pay | Admitting: Hematology and Oncology

## 2021-04-18 DIAGNOSIS — Z780 Asymptomatic menopausal state: Secondary | ICD-10-CM

## 2021-04-18 DIAGNOSIS — M1710 Unilateral primary osteoarthritis, unspecified knee: Secondary | ICD-10-CM

## 2021-04-18 DIAGNOSIS — Z13820 Encounter for screening for osteoporosis: Secondary | ICD-10-CM

## 2021-04-18 DIAGNOSIS — Z1231 Encounter for screening mammogram for malignant neoplasm of breast: Secondary | ICD-10-CM

## 2021-04-19 NOTE — Telephone Encounter (Signed)
Please advise. This is not covered under screening for osteoporosis. What other Dx should I use?

## 2021-04-25 ENCOUNTER — Other Ambulatory Visit: Payer: Self-pay | Admitting: Family Medicine

## 2021-04-25 DIAGNOSIS — E2839 Other primary ovarian failure: Secondary | ICD-10-CM

## 2021-04-29 ENCOUNTER — Ambulatory Visit
Admission: RE | Admit: 2021-04-29 | Discharge: 2021-04-29 | Disposition: A | Discharge status: Home / Self Care | Payer: Medicare Other | Source: Ambulatory Visit | Attending: Family Medicine | Admitting: Family Medicine

## 2021-04-29 DIAGNOSIS — E2839 Other primary ovarian failure: Secondary | ICD-10-CM

## 2021-04-29 DIAGNOSIS — Z780 Asymptomatic menopausal state: Secondary | ICD-10-CM | POA: Diagnosis not present

## 2021-05-07 NOTE — Progress Notes (Signed)
Patient Care Team: Eulas Post, MD as PCP - General (Family Medicine) Marcy Panning, MD as Consulting Physician (Internal Medicine) Thea Silversmith, MD as Consulting Physician (Radiation Oncology) Nicholas Lose, MD as Consulting Physician (Hematology and Oncology) Gaynelle Arabian, MD as Consulting Physician (Orthopedic Surgery) Danella Sensing, MD as Consulting Physician (Dermatology) Melissa Noon, [place] as Consulting Physician (Optometry) Ernestine Conrad, MD as Consulting Physician (Otolaryngology)  DIAGNOSIS:    ICD-10-CM   1. 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 Radiation recall related to tamoxifen   11/25/2010 -  Anti-estrogen oral therapy   Aromasin 25 mg daily.myalgias and arthralgias stop November 2012 went back to tamoxifen 20 mg daily   01/06/2017 Pathology Results   Bone marrow biopsy: Slightly hypercellular bone marrow for age with granulocytic hyperplasia, numerous small lymphoid aggregates present. This may represent minimal involvement by low-grade B-cell lymphoma or liver process like SLL/CLL because they are CD5 positive   Diffuse large B cell lymphoma ([place])  12/26/2016 Initial Diagnosis   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 with above-mentioned history of breast cancer and large B cell lymphoma having undergone a right lumpectomy, chemotherapy, and radiation therapy. She presents to the clinic today for follow-up.  She continues to have some hoarseness of voice as well as swallowing difficulties for which she is undergoing dilatation every 6 months.  She denies any other fevers chills night sweats or weight loss.  Nutrition has remained fairly good in spite of prior history of profound esophageal dilatations.     ALLERGIES:  has no allergies on file.  MEDICATIONS:  Current Outpatient Medications  Medication Sig Dispense Refill   COVID-19 mRNA bivalent vaccine, Pfizer, (PFIZER COVID-19 VAC BIVALENT) injection Inject into the muscle. 0.3 mL 0   hydrochlorothiazide (HYDRODIURIL) 12.5 MG tablet Take 1 tablet (12.5 mg total) by mouth daily. 90 tablet 3   levothyroxine (SYNTHROID) 75 MCG tablet Take 1 tablet (75 mcg total) by mouth daily. 90 tablet 3   loperamide (IMODIUM A-D) 2 MG tablet Place 2 mg into feeding tube 4 (four) times daily as needed for diarrhea or loose stools.     losartan (COZAAR) 50 MG tablet Take 1 tablet (50 mg total) by mouth daily. 90 tablet 3   Multiple Vitamins-Minerals (ADULT GUMMY PO) Take 2 tablets by mouth daily. Gummy     omeprazole (  PRILOSEC) 20 MG capsule TAKE 1 CAPSULE by mouth once daily 90 capsule 3   pramipexole (MIRAPEX) 0.5 MG tablet Take 1 tablet (0.5 mg total) by mouth at bedtime. 90 tablet 3   No current facility-administered  medications for this visit.    PHYSICAL EXAMINATION: ECOG PERFORMANCE STATUS: 1 - Symptomatic but completely ambulatory  Vitals:   05/08/21 1114  BP: (!) 155/44  Pulse: 68  Resp: 18  Temp: 97.7 F (36.5 C)  SpO2: 98%   Filed Weights   05/08/21 1114  Weight: 182 lb 6 oz (82.7 kg)      LABORATORY DATA:  I have reviewed the data as listed CMP Latest Ref Rng & Units 05/08/2021 11/06/2020 05/16/2020  Glucose 70 - 99 mg/dL 93 102(H) 83  BUN 8 - 23 mg/dL 22 29(H) 18  Creatinine 0.44 - 1.00 mg/dL 1.14(H) 1.30(H) 1.06(H)  Sodium 135 - 145 mmol/L 137 141 143  Potassium 3.5 - 5.1 mmol/L 4.3 4.6 4.4  Chloride 98 - 111 mmol/L 103 105 108  CO2 22 - 32 mmol/L _0 Calcium 8.9 - 10.3 mg/dL 9.1 9.5 9.1  Total Protein 6.5 - 8.1 g/dL 7.4 7.5 7.7  Total Bilirubin 0.3 - 1.2 mg/dL 0.4 0.5 0.4  Alkaline Phos 38 - 126 U/L 69 64 66  AST 15 - 41 U/L _1 ALT 0 - 44 U/L _2 Lab Results  Component Value Date   WBC 7.7 05/08/2021   HGB 11.8 (L) 05/08/2021   HCT 36.7 05/08/2021   MCV 81.7 05/08/2021   PLT 233 05/08/2021   NEUTROABS 4.6 05/08/2021    ASSESSMENT & PLAN:  Diffuse large B cell lymphoma ([place]) 12/26/2016 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 positive for LCA, CD20, CD79a PAX-5, BCL-6 and weak cytoplasmic kappa associated with patchy weak positivity for BCL-2 and CD10, Ki-67 40%   Stage IIb (bilateral cervical, esophageal, mediastinal, supraclavicular nodes)   Bone marrow biopsy negative for diffuse large B cell lymphoma but suggestive of small low-grade B cell population possibly SLL/CLL   Revised IPI score: 1, good prognosis, redictated for years PFS 80%, predicted overall survival 79% ------------------------------------------------------------------------ Prior treatment: R CHOP X 5 cycles completed 04/10/2017   TE Fistula: Repaired    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   Completed November 2019. 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 PET CT scan 11/11/2018: No evidence of lymphoma on the scan.  Hypermetabolic activity around the PEG tube site. CT chest with contrast November 16, 2019: No acute cardiopulmonary abnormalities no residual lymphoma.  Emphysema, coronary calcifications, gallstones   Scans to be done on an as-needed basis. Return to clinic in 6 months with labs and follow-up    Orders Placed This Encounter  Procedures   CBC with Differential ([place] Only)    Standing Status:   Future    Standing Expiration Date:   05/08/2022   CMP ([place] only)    Standing Status:   Future    Standing Expiration Date:   05/08/2022   Lactate dehydrogenase    Standing Status:   Future    Standing Expiration Date:   05/08/2022   The patient has a good understanding of the overall plan. she agrees with it. she will call with any problems that may develop before the next visit here.  Total time spent: 30 mins including face to face time and time spent for planning, charting and coordination of care  Rulon Eisenmenger, MD, MPH 05/08/2021  I, Thana Ates, am acting as scribe for Dr. Nicholas Lose.  I have reviewed the above documentation for accuracy and completeness, and I agree with the above.

## 2021-05-08 ENCOUNTER — Inpatient Hospital Stay: Payer: Medicare Other | Attending: Hematology and Oncology

## 2021-05-08 ENCOUNTER — Inpatient Hospital Stay (HOSPITAL_BASED_OUTPATIENT_CLINIC_OR_DEPARTMENT_OTHER): Payer: Medicare Other | Admitting: Hematology and Oncology

## 2021-05-08 ENCOUNTER — Other Ambulatory Visit: Payer: Self-pay

## 2021-05-08 DIAGNOSIS — C8330 Diffuse large B-cell lymphoma, unspecified site: Secondary | ICD-10-CM

## 2021-05-08 DIAGNOSIS — Z79899 Other long term (current) drug therapy: Secondary | ICD-10-CM | POA: Insufficient documentation

## 2021-05-08 DIAGNOSIS — C50411 Malignant neoplasm of upper-outer quadrant of right female breast: Secondary | ICD-10-CM | POA: Insufficient documentation

## 2021-05-08 DIAGNOSIS — Z7901 Long term (current) use of anticoagulants: Secondary | ICD-10-CM | POA: Diagnosis not present

## 2021-05-08 DIAGNOSIS — Z86711 Personal history of pulmonary embolism: Secondary | ICD-10-CM | POA: Diagnosis not present

## 2021-05-08 LAB — CMP (CANCER CENTER ONLY)
ALT: 9 U/L (ref 0–44)
AST: 17 U/L (ref 15–41)
Albumin: 3.7 g/dL (ref 3.5–5.0)
Alkaline Phosphatase: 69 U/L (ref 38–126)
Anion gap: 5 (ref 5–15)
BUN: 22 mg/dL (ref 8–23)
CO2: 29 mmol/L (ref 22–32)
Calcium: 9.1 mg/dL (ref 8.9–10.3)
Chloride: 103 mmol/L (ref 98–111)
Creatinine: 1.14 mg/dL — ABNORMAL HIGH (ref 0.44–1.00)
GFR, Estimated: 51 mL/min — ABNORMAL LOW (ref >60)
Glucose, Bld: 93 mg/dL (ref 70–99)
Potassium: 4.3 mmol/L (ref 3.5–5.1)
Sodium: 137 mmol/L (ref 135–145)
Total Bilirubin: 0.4 mg/dL (ref 0.3–1.2)
Total Protein: 7.4 g/dL (ref 6.5–8.1)

## 2021-05-08 LAB — CBC WITH DIFFERENTIAL (CANCER CENTER ONLY)
Abs Immature Granulocytes: 0.02 10*3/uL (ref 0.00–0.07)
Basophils Absolute: 0.1 10*3/uL (ref 0.0–0.1)
Basophils Relative: 1 %
Eosinophils Absolute: 0.4 10*3/uL (ref 0.0–0.5)
Eosinophils Relative: 5 %
HCT: 36.7 % (ref 36.0–46.0)
Hemoglobin: 11.8 g/dL — ABNORMAL LOW (ref 12.0–15.0)
Immature Granulocytes: 0 %
Lymphocytes Relative: 25 %
Lymphs Abs: 1.9 10*3/uL (ref 0.7–4.0)
MCH: 26.3 pg (ref 26.0–34.0)
MCHC: 32.2 g/dL (ref 30.0–36.0)
MCV: 81.7 fL (ref 80.0–100.0)
Monocytes Absolute: 0.8 10*3/uL (ref 0.1–1.0)
Monocytes Relative: 10 %
Neutro Abs: 4.6 10*3/uL (ref 1.7–7.7)
Neutrophils Relative %: 59 %
Platelet Count: 233 10*3/uL (ref 150–400)
RBC: 4.49 MIL/uL (ref 3.87–5.11)
RDW: 14.6 % (ref 11.5–15.5)
WBC Count: 7.7 10*3/uL (ref 4.0–10.5)
nRBC: 0 % (ref 0.0–0.2)

## 2021-05-08 LAB — LACTATE DEHYDROGENASE: LDH: 173 U/L (ref 98–192)

## 2021-05-08 NOTE — Assessment & Plan Note (Signed)
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 CompletedNovember 2019. 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 PET CT scan 11/11/2018:No evidence of lymphoma on the scan. Hypermetabolic activity around the PEG tube site. CT chest with contrast November 16, 2019: No acute cardiopulmonary abnormalities no residual lymphoma. Emphysema, coronary calcifications, gallstones  Scans to be done on an as-needed basis. Return to clinic in 6 months with labs and follow-up

## 2021-05-14 ENCOUNTER — Ambulatory Visit
Admission: RE | Admit: 2021-05-14 | Discharge: 2021-05-14 | Disposition: A | Discharge status: Home / Self Care | Payer: Medicare Other | Source: Ambulatory Visit | Attending: Hematology and Oncology | Admitting: Hematology and Oncology

## 2021-05-14 DIAGNOSIS — Z1231 Encounter for screening mammogram for malignant neoplasm of breast: Secondary | ICD-10-CM

## 2021-05-16 ENCOUNTER — Other Ambulatory Visit (HOSPITAL_COMMUNITY)
Admission: RE | Admit: 2021-05-16 | Discharge: 2021-05-16 | Disposition: A | Discharge status: Home / Self Care | Payer: Medicare Other | Source: Ambulatory Visit | Attending: Orthopedic Surgery | Admitting: Orthopedic Surgery

## 2021-05-16 DIAGNOSIS — M25559 Pain in unspecified hip: Secondary | ICD-10-CM | POA: Diagnosis present

## 2021-05-16 DIAGNOSIS — M25551 Pain in right hip: Secondary | ICD-10-CM | POA: Diagnosis not present

## 2021-05-16 DIAGNOSIS — Z96641 Presence of right artificial hip joint: Secondary | ICD-10-CM | POA: Insufficient documentation

## 2021-05-16 DIAGNOSIS — T8484XA Pain due to internal orthopedic prosthetic devices, implants and grafts, initial encounter: Secondary | ICD-10-CM | POA: Insufficient documentation

## 2021-05-16 DIAGNOSIS — M25552 Pain in left hip: Secondary | ICD-10-CM | POA: Diagnosis not present

## 2021-05-16 DIAGNOSIS — Y831 Surgical operation with implant of artificial internal device as the cause of abnormal reaction of the patient, or of later complication, without mention of misadventure at the time of the procedure: Secondary | ICD-10-CM | POA: Diagnosis not present

## 2021-05-16 LAB — C-REACTIVE PROTEIN: CRP: 0.6 mg/dL (ref <1.0)

## 2021-05-16 LAB — SEDIMENTATION RATE: Sed Rate: 24 mm/hr — ABNORMAL HIGH (ref 0–22)

## 2021-05-23 ENCOUNTER — Other Ambulatory Visit: Payer: Self-pay | Admitting: Family Medicine

## 2021-06-03 ENCOUNTER — Other Ambulatory Visit (HOSPITAL_BASED_OUTPATIENT_CLINIC_OR_DEPARTMENT_OTHER): Payer: Self-pay

## 2021-06-03 MED ORDER — ZOSTER VAC RECOMB ADJUVANTED 50 MCG/0.5ML IM SUSR
INTRAMUSCULAR | 1 refills | Status: DC
  Filled 2021-06-03: qty 0.5, 1d supply, fill #0
  Filled 2021-08-19: qty 0.5, 1d supply, fill #1

## 2021-06-18 ENCOUNTER — Ambulatory Visit (INDEPENDENT_AMBULATORY_CARE_PROVIDER_SITE_OTHER): Payer: Medicare Other | Admitting: Family Medicine

## 2021-06-18 ENCOUNTER — Encounter: Payer: Self-pay | Admitting: Family Medicine

## 2021-06-18 VITALS — BP 136/68 | HR 61 | Temp 97.8°F | Ht 61.0 in | Wt 180.9 lb

## 2021-06-18 DIAGNOSIS — R011 Cardiac murmur, unspecified: Secondary | ICD-10-CM

## 2021-06-18 DIAGNOSIS — D649 Anemia, unspecified: Secondary | ICD-10-CM | POA: Diagnosis not present

## 2021-06-18 DIAGNOSIS — N183 Chronic kidney disease, stage 3 unspecified: Secondary | ICD-10-CM

## 2021-06-18 DIAGNOSIS — G2581 Restless legs syndrome: Secondary | ICD-10-CM | POA: Diagnosis not present

## 2021-06-18 DIAGNOSIS — E039 Hypothyroidism, unspecified: Secondary | ICD-10-CM

## 2021-06-18 DIAGNOSIS — I10 Essential (primary) hypertension: Secondary | ICD-10-CM | POA: Diagnosis not present

## 2021-06-18 LAB — TSH: TSH: 1.88 u[IU]/mL (ref 0.35–5.50)

## 2021-06-18 LAB — CBC WITH DIFFERENTIAL/PLATELET
Basophils Absolute: 0.1 10*3/uL (ref 0.0–0.1)
Basophils Relative: 0.9 % (ref 0.0–3.0)
Eosinophils Absolute: 0.2 10*3/uL (ref 0.0–0.7)
Eosinophils Relative: 3.1 % (ref 0.0–5.0)
HCT: 39.7 % (ref 36.0–46.0)
Hemoglobin: 12.9 g/dL (ref 12.0–15.0)
Lymphocytes Relative: 23.8 % (ref 12.0–46.0)
Lymphs Abs: 1.7 10*3/uL (ref 0.7–4.0)
MCHC: 32.5 g/dL (ref 30.0–36.0)
MCV: 81.4 fl (ref 78.0–100.0)
Monocytes Absolute: 0.6 10*3/uL (ref 0.1–1.0)
Monocytes Relative: 8.8 % (ref 3.0–12.0)
Neutro Abs: 4.5 10*3/uL (ref 1.4–7.7)
Neutrophils Relative %: 63.4 % (ref 43.0–77.0)
Platelets: 229 10*3/uL (ref 150.0–400.0)
RBC: 4.88 Mil/uL (ref 3.87–5.11)
RDW: 16.6 % — ABNORMAL HIGH (ref 11.5–15.5)
WBC: 7.1 10*3/uL (ref 4.0–10.5)

## 2021-06-18 LAB — IBC + FERRITIN
Ferritin: 14.8 ng/mL (ref 10.0–291.0)
Iron: 55 ug/dL (ref 42–145)
Saturation Ratios: 14.2 % — ABNORMAL LOW (ref 20.0–50.0)
TIBC: 387.8 ug/dL (ref 250.0–450.0)
Transferrin: 277 mg/dL (ref 212.0–360.0)

## 2021-06-18 LAB — FERRITIN: Ferritin: 14.8 ng/mL (ref 10.0–291.0)

## 2021-06-18 NOTE — Patient Instructions (Signed)
I will set up heart echocardiogram to evaluate your murmur.

## 2021-06-18 NOTE — Progress Notes (Signed)
Established Patient Office Visit  Subjective:  Patient ID: Kristen Escobar, female    DOB: [birth year]  Age: 76 y.o. MRN: [id number]  CC:  Chief Complaint  Patient presents with   Annual Exam    HPI Kristen Escobar presents initially for "complete physical ".  However, she has Medicare primary and had Medicare wellness visit last June.  We addressed several medical issues as follows  She has multiple chronic problems including history of breast cancer, history of Buerger's disease, hypertension, past history of pulmonary embolus, history of tracheoesophageal fistula, GERD, hypothyroidism, history of subdural hematoma, restless leg syndrome, morbid obesity.  She recently saw her hematologist and was noted to have somewhat prominent murmur more so than previously.  She did have echocardiogram 2018 which showed only trivial mitral regurgitation with no significant mitral or aortic valve abnormalities.  She denies any dizziness or dyspnea.  [facility]  She is enjoying that.  Recent labs per oncology reviewed.  Hemoglobin was 11.8 which is a slight drop from previous of 12.4.  MCV over 90.  GFR 51.  This was stable.  She is due for follow-up thyroid functions.  She takes her thyroid replacement regularly.  She is on HCTZ for hypertension.  Restless leg syndrome and has infrequent symptoms and takes Mirapex which works well for her.  Minimal caffeine use.  No alcohol use.  Past Medical History:  Diagnosis Date   Acute respiratory failure with hypoxia ([place]) 02/16/2017   Arthritis    Cancer ([place]) 03/2009   breast- rt   Complication of anesthesia    Hard to void after knee replacement 05-2018   Diffuse large B cell lymphoma ([place]) dx'd 02/17/17   in remission 2 years now   had chemo    GERD (gastroesophageal reflux disease)    History of radiation therapy 07/12/10,completed   right breast 60 Gy x30 fx   Hypertension    no meds currently    Hypothyroidism    Obesity    Peripheral vascular disease ([place]) [place] HANDS AND GANGRENE OF BOTH INDEX FINGERS--REQUIRING AMPUTATION OF THE INDEX FINGERS AND VASCULAR SURGERY.  PT TOLD HER PROBLEMS RELATED TO SMOKING.   NO OTHER PROBLEMS SINCE.   Personal history of radiation therapy 2012   Right Breast Cancer   Pneumonia    Thyroid mass     Past Surgical History:  Procedure Laterality Date   AMPUTATION     partial amputation of both index fingers   BREAST LUMPECTOMY Right 04/01/2010   BREAST SURGERY  2011   lumpectomy with node sampling- RIGHT   CATARACT EXTRACTION, BILATERAL  approx 2017   COLONOSCOPY     ESOPHAGOGASTRODUODENOSCOPY     EXCISION MASS NECK Left 12/26/2016   Procedure: EXCISION MASS NECK;  Surgeon: Izora Gala, MD;  Location: [facility];  Service: ENT;  Laterality: Left;  open excision of thyroid mass left side with frozen section   HAND SURGERY Bilateral 1995   Amputaed pointer fingers bilaterally   IR FLUORO GUIDE PORT INSERTION RIGHT  01/14/2017   IR GASTROSTOMY TUBE MOD SED  02/19/2017   IR GASTROSTOMY TUBE REMOVAL  12/07/2018   IR PATIENT EVAL TECH 0-60 MINS  01/14/2019   IR REMOVAL TUN ACCESS W/ PORT W/O FL MOD SED  11/30/2017   IR REPLACE G-TUBE SIMPLE WO FLUORO  08/03/2017   IR REPLACE G-TUBE SIMPLE WO FLUORO  06/10/2018  IR REPLC GASTRO/COLONIC TUBE PERCUT W/FLUORO  02/25/2017   IR REPLC GASTRO/COLONIC TUBE PERCUT W/FLUORO  03/09/2017   IR US GUIDE BX ASP/DRAIN  12/07/2018   IR US GUIDE VASC ACCESS RIGHT  01/14/2017   JOINT REPLACEMENT     LAPAROSCOPIC GASTRIC RESECTION N/A 09/09/2019   Procedure: LAPAROSCOPIC CLOSURE OF GASTRO-CUTANEOUS FISTULA, EXCISION OF FISTUAL TRACK;  Surgeon: Alphonsa Overall, MD;  Location: [facility];  Service: General;  Laterality: N/A;   TOTAL HIP ARTHROPLASTY  12/16/2011   right hip   TOTAL HIP ARTHROPLASTY  01/20/2012   Procedure: TOTAL HIP ARTHROPLASTY ANTERIOR APPROACH;  Surgeon: Mauri Pole, MD;   Location: [facility];  Service: Orthopedics;  Laterality: Left;   TOTAL KNEE ARTHROPLASTY Right 05/31/2018   Procedure: TOTAL KNEE ARTHROPLASTY;  Surgeon: Gaynelle Arabian, MD;  Location: [facility];  Service: Orthopedics;  Laterality: Right;  Adductor Block   TUBAL LIGATION     VASCULAR SURGERY     both hands    Family History  Problem Relation Age of Onset   Cancer Mother        pt unaware of what kind   Hearing loss Mother    Coronary artery disease Father    Diabetes Father    Coronary artery disease Brother    Coronary artery disease Brother    Diabetes Sister    Cancer Sister        breast   Escobar cancer Neg Hx    Stomach cancer Neg Hx    Rectal cancer Neg Hx    Esophageal cancer Neg Hx     Social History   Socioeconomic History   Marital status: Married    Spouse name: Not on file   Number of children: Not on file   Years of education: Not on file   Highest education level: Not on file  Occupational History   Not on file  Tobacco Use   Smoking status: Former    Packs/day: 1.00    Years: 20.00    Pack years: 20.00    Types: Cigarettes    Quit date: 04/21/1993    Years since quitting: 28.1   Smokeless tobacco: Never  Vaping Use   Vaping Use: Never used  Substance and Sexual Activity   Alcohol use: Not Currently    Comment: OCCAS - MAYBE ONCE A MONTH   Drug use: No   Sexual activity: Not Currently  Other Topics Concern   Not on file  Social History Narrative   Not on file   Social Determinants of Health   Financial Resource Strain: Low Risk    Difficulty of Paying Living Expenses: Not hard at all  Food Insecurity: No Food Insecurity   Worried About Charity fundraiser in the Last Year: Never true   Ran Out of Food in the Last Year: Never true  Transportation Needs: No Transportation Needs   Lack of Transportation (Medical): No   Lack of Transportation (Non-Medical): No  Physical Activity: Sufficiently Active   Days of Exercise per Week: 5 days   Minutes of  Exercise per Session: 40 min  Stress: No Stress Concern Present   Feeling of Stress : Not at all  Social Connections: Moderately Integrated   Frequency of Communication with Friends and Family: Twice a week   Frequency of Social Gatherings with Friends and Family: Twice a week   Attends Religious Services: 1 to 4 times per year   Active Member of Clubs or Organizations: No   Attends  Archivist Meetings: Never   Marital Status: Married  Human resources officer Violence: Not At Risk   Fear of Current or Ex-Partner: No   Emotionally Abused: No   Physically Abused: No   Sexually Abused: No    Outpatient Medications Prior to Visit  Medication Sig Dispense Refill   COVID-19 mRNA bivalent vaccine, Pfizer, (PFIZER COVID-19 VAC BIVALENT) injection Inject into the muscle. 0.3 mL 0   hydrochlorothiazide (HYDRODIURIL) 12.5 MG tablet Take 1 tablet (12.5 mg total) by mouth daily. 90 tablet 3   levothyroxine (SYNTHROID) 75 MCG tablet Take 1 tablet (75 mcg total) by mouth daily. 90 tablet 3   loperamide (IMODIUM A-D) 2 MG tablet Place 2 mg into feeding tube 4 (four) times daily as needed for diarrhea or loose stools.     losartan (COZAAR) 50 MG tablet Take 1 tablet (50 mg total) by mouth daily. 90 tablet 3   Multiple Vitamins-Minerals (ADULT GUMMY PO) Take 2 tablets by mouth daily. Gummy     omeprazole (PRILOSEC) 20 MG capsule TAKE 1 CAPSULE by mouth once daily 90 capsule 3   pramipexole (MIRAPEX) 0.5 MG tablet TAKE 1 TABLET BY MOUTH AT BEDTIME. 90 tablet 0   Zoster Vaccine Adjuvanted [facility]) injection Inject into the muscle. 0.5 mL 1   No facility-administered medications prior to visit.    No Known Allergies  ROS Review of Systems  Constitutional:  Negative for fatigue.  Eyes:  Negative for visual disturbance.  Respiratory:  Negative for cough, chest tightness, shortness of breath and wheezing.   Cardiovascular:  Negative for chest pain, palpitations and leg swelling.  Endocrine:  Negative for polydipsia and polyuria.  Musculoskeletal:  Positive for arthralgias.  Neurological:  Negative for dizziness, seizures, syncope, weakness, light-headedness and headaches.     Objective:    Physical Exam Constitutional:      Appearance: She is well-developed.  Eyes:     Pupils: Pupils are equal, round, and reactive to light.  Neck:     Thyroid: No thyromegaly.     Vascular: No JVD.  Cardiovascular:     Rate and Rhythm: Normal rate and regular rhythm.     Heart sounds: Murmur heard.    No gallop.     Comments: She has systolic murmur which is heard best left sternal border and also right upper sternal border.  2-3 out of 6 Pulmonary:     Effort: Pulmonary effort is normal. No respiratory distress.     Breath sounds: Normal breath sounds. No wheezing or rales.  Musculoskeletal:     Cervical back: Neck supple.  Neurological:     Mental Status: She is alert.    BP 136/68 (BP Location: Left Arm, Patient Position: Sitting, Cuff Size: Large)    Pulse 61    Temp 97.8 F (36.6 C) (Oral)    Ht $R'5\' 1"'US$  (1.549 m)    Wt 180 lb 14.4 oz (82.1 kg)    SpO2 98%    BMI 34.18 kg/m  Wt Readings from Last 3 Encounters:  06/18/21 180 lb 14.4 oz (82.1 kg)  05/08/21 182 lb 6 oz (82.7 kg)  11/06/20 174 lb (78.9 kg)     There are no preventive care reminders to display for this patient.  There are no preventive care reminders to display for this patient.  Lab Results  Component Value Date   TSH 0.86 09/12/2020   Lab Results  Component Value Date   WBC 7.7 05/08/2021   HGB 11.8 (L) 05/08/2021  HCT 36.7 05/08/2021   MCV 81.7 05/08/2021   PLT 233 05/08/2021   Lab Results  Component Value Date   NA 137 05/08/2021   K 4.3 05/08/2021   CHLORIDE 103 04/10/2017   CO2 29 05/08/2021   GLUCOSE 93 05/08/2021   BUN 22 05/08/2021   CREATININE 1.14 (H) 05/08/2021   BILITOT 0.4 05/08/2021   ALKPHOS 69 05/08/2021   AST 17 05/08/2021   ALT 9 05/08/2021   PROT 7.4 05/08/2021    ALBUMIN 3.7 05/08/2021   CALCIUM 9.1 05/08/2021   ANIONGAP 5 05/08/2021   EGFR >60 04/10/2017   GFR 46.37 (L) 03/25/2016   Lab Results  Component Value Date   CHOL 152 03/25/2016   Lab Results  Component Value Date   HDL 49.80 03/25/2016   Lab Results  Component Value Date   LDLCALC 78 03/25/2016   Lab Results  Component Value Date   TRIG 108 02/18/2017   Lab Results  Component Value Date   CHOLHDL 3 03/25/2016   Lab Results  Component Value Date   HGBA1C 5.7 (H) 06/02/2018      Assessment & Plan:   #1 heart murmur which is heard best left sternal border which seems more prominent than previously noted.  She does not have any worrisome symptoms such as dizziness, syncope, or dyspnea.  Set up echocardiogram to further assess  #2 hypertension stable.  Continue weight loss and weight control efforts.  Continue regular aerobic exercise.  Continue HCTZ.  Will not check basic metabolic panel since she had recent CMP through hematology  #3 mild normocytic anemia.  Recheck CBC along with ferritin and TIBC/serum iron  #4 restless leg syndrome.  Patient has infrequent symptoms controlled with Mirapex.  Continue to avoid late day use of caffeine and screen for anemia as above.  #5 hypothyroidism.  Recheck TSH  #6 history of chronic kidney disease.  Stable by recent labs.  Remind her to avoid nonsteroidals   No orders of the defined types were placed in this encounter.   Follow-up: Return in about 1 year (around 06/18/2022).    Carolann Littler, MD

## 2021-06-30 ENCOUNTER — Other Ambulatory Visit: Payer: Self-pay | Admitting: Family Medicine

## 2021-07-03 ENCOUNTER — Ambulatory Visit (HOSPITAL_COMMUNITY): Payer: Medicare Other | Attending: Cardiovascular Disease

## 2021-07-03 ENCOUNTER — Other Ambulatory Visit: Payer: Self-pay

## 2021-07-03 DIAGNOSIS — R011 Cardiac murmur, unspecified: Secondary | ICD-10-CM | POA: Diagnosis not present

## 2021-07-03 LAB — ECHOCARDIOGRAM COMPLETE
Area-P 1/2: 3.33 cm2
P 1/2 time: 623 msec
S' Lateral: 2.1 cm

## 2021-08-03 ENCOUNTER — Other Ambulatory Visit: Payer: Self-pay | Admitting: Family Medicine

## 2021-08-03 DIAGNOSIS — E039 Hypothyroidism, unspecified: Secondary | ICD-10-CM

## 2021-08-05 ENCOUNTER — Other Ambulatory Visit: Payer: Self-pay

## 2021-08-05 DIAGNOSIS — E039 Hypothyroidism, unspecified: Secondary | ICD-10-CM

## 2021-08-05 MED ORDER — PRAMIPEXOLE DIHYDROCHLORIDE 0.5 MG PO TABS: 0.5000 mg | ORAL_TABLET | Freq: Every day | ORAL | 0 refills | Status: DC

## 2021-08-05 MED ORDER — LEVOTHYROXINE SODIUM 75 MCG PO TABS: 75.0000 ug | ORAL_TABLET | Freq: Every day | ORAL | 0 refills | Status: DC

## 2021-08-08 DIAGNOSIS — Z20822 Contact with and (suspected) exposure to covid-19: Secondary | ICD-10-CM | POA: Diagnosis not present

## 2021-08-15 ENCOUNTER — Other Ambulatory Visit: Payer: Self-pay | Admitting: Family Medicine

## 2021-08-19 ENCOUNTER — Other Ambulatory Visit (HOSPITAL_BASED_OUTPATIENT_CLINIC_OR_DEPARTMENT_OTHER): Payer: Self-pay

## 2021-08-27 DIAGNOSIS — Z20822 Contact with and (suspected) exposure to covid-19: Secondary | ICD-10-CM | POA: Diagnosis not present

## 2021-08-29 ENCOUNTER — Other Ambulatory Visit: Payer: Self-pay

## 2021-08-29 ENCOUNTER — Telehealth: Payer: Self-pay | Admitting: Family Medicine

## 2021-08-29 DIAGNOSIS — I10 Essential (primary) hypertension: Secondary | ICD-10-CM

## 2021-08-29 MED ORDER — LOSARTAN POTASSIUM 50 MG PO TABS: 50.0000 mg | ORAL_TABLET | Freq: Every day | ORAL | 0 refills | Status: DC

## 2021-08-29 NOTE — Telephone Encounter (Signed)
Pt requesting refill of  losartan (COZAAR) 50 MG tablet  [facility]  [id number]  [phone] ?Fax:  [phone] ?  ? ?

## 2021-08-29 NOTE — Telephone Encounter (Signed)
[facility]ill sent, [facility] when refill ready for pick up ?

## 2021-09-01 ENCOUNTER — Other Ambulatory Visit: Payer: Self-pay | Admitting: Family Medicine

## 2021-09-02 ENCOUNTER — Telehealth: Payer: Self-pay | Admitting: Family Medicine

## 2021-09-02 MED ORDER — LOSARTAN POTASSIUM-HCTZ 50-12.5 MG PO TABS: 1.0000 | ORAL_TABLET | Freq: Every day | ORAL | 3 refills | Status: DC

## 2021-09-02 NOTE — Telephone Encounter (Signed)
Rx sent 

## 2021-09-02 NOTE — Telephone Encounter (Signed)
Pt calling in regarding a refill for a combination of hydrochlorothiazide (HYDRODIURIL) 12.5 MG tablet   and  losartan (COZAAR) 50 MG tablet     . States in the past they got a prescription for a combination tablet and they would like to go back to the combo pill if possible.  States they need refills to last until her return appointment next year ?[id number]  [phone] ?Fax:  [phone] ?  ? ?

## 2021-10-08 DIAGNOSIS — M79662 Pain in left lower leg: Secondary | ICD-10-CM | POA: Diagnosis not present

## 2021-10-08 DIAGNOSIS — M5451 Vertebrogenic low back pain: Secondary | ICD-10-CM | POA: Diagnosis not present

## 2021-10-10 ENCOUNTER — Ambulatory Visit (INDEPENDENT_AMBULATORY_CARE_PROVIDER_SITE_OTHER): Payer: Medicare Other

## 2021-10-10 VITALS — Ht 61.0 in | Wt 180.0 lb

## 2021-10-10 DIAGNOSIS — Z0000 Encounter for general adult medical examination without abnormal findings: Secondary | ICD-10-CM | POA: Diagnosis not present

## 2021-10-10 NOTE — Progress Notes (Addendum)
Subjective:   YOUSRA Escobar is a 76 y.o. female who presents for Medicare Annual (Subsequent) preventive examination.  Review of Systems    Virtual Visit via Telephone Note  I connected with  Kristen Escobar on 10/10/21 at 10:30 AM EDT by telephone and verified that I am speaking with the correct person using two identifiers.  Location: Patient: Home Provider: Office Persons participating in the virtual visit: patient/Nurse Health Advisor   I discussed the limitations, risks, security and privacy concerns of performing an evaluation and management service by telephone and the availability of in person appointments. The patient expressed understanding and agreed to proceed.  Interactive audio and video telecommunications were attempted between this nurse and patient, however failed, due to patient having technical difficulties OR patient did not have access to video capability.  We continued and completed visit with audio only.  Some vital signs may be absent or patient reported.   [name], LPN  Cardiac Risk Factors include: advanced age (>69mn, >>65women);hypertension     Objective:    Today's Vitals   10/10/21 1037  Weight: 180 lb (81.6 kg)  Height: '5\' 1"'$  (1.549 m)   Body mass index is 34.01 kg/m.     10/10/2021   10:44 AM 10/09/2020   10:37 AM 09/09/2019   11:23 AM 09/02/2019    8:23 AM 06/15/2019   10:35 AM 12/07/2018    7:47 AM 06/03/2018    2:57 PM  Advanced Directives  Does Patient Have a Medical Advance Directive? Yes Yes Yes Yes Yes Yes No  Type of AParamedicof AFlat Willow ColonyLiving will HKern[place]Living will HStantonsburgLiving will H[place]leevilleLiving will Living will;Healthcare Power of ALeilani EstatesLiving will   Does patient want to make changes to medical advance directive? No - Patient declined  No - Patient declined No - Patient declined No - Patient  declined No - Patient declined   Copy of HLoomisin Chart? No - copy requested No - copy requested No - copy requested No - copy requested No - copy requested No - copy requested     Current Medications (verified) Outpatient Encounter Medications as of 10/10/2021  Medication Sig   COVID-19 mRNA bivalent vaccine, Pfizer, (PFIZER COVID-19 VAC BIVALENT) injection Inject into the muscle.   levothyroxine (SYNTHROID) 75 MCG tablet Take 1 tablet (75 mcg total) by mouth daily.   loperamide (IMODIUM A-D) 2 MG tablet Place 2 mg into feeding tube 4 (four) times daily as needed for diarrhea or loose stools.   losartan-hydrochlorothiazide (HYZAAR) 50-12.5 MG tablet Take 1 tablet by mouth daily.   Multiple Vitamins-Minerals (ADULT GUMMY PO) Take 2 tablets by mouth daily. Gummy   omeprazole (PRILOSEC) 20 MG capsule TAKE 1 CAPSULE BY MOUTH ONCE DAILY   pramipexole (MIRAPEX) 0.5 MG tablet Take 1 tablet (0.5 mg total) by mouth at bedtime.   Zoster Vaccine Adjuvanted ([facility] injection Inject into the muscle.   No facility-administered encounter medications on file as of 10/10/2021.    Allergies (verified) Patient has no known allergies.   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  PT TOLD HER PROBLEMS RELATED TO SMOKING.   NO OTHER  PROBLEMS SINCE.   Personal history of radiation therapy 2012   Right Breast Cancer   Pneumonia    Thyroid mass    Past Surgical History:  Procedure Laterality Date   AMPUTATION     partial amputation of both index fingers   BREAST LUMPECTOMY Right 04/01/2010   BREAST SURGERY  2011   lumpectomy with node sampling- RIGHT   CATARACT EXTRACTION, BILATERAL  approx 2017   COLONOSCOPY     ESOPHAGOGASTRODUODENOSCOPY     EXCISION MASS NECK Left 12/26/2016   Procedure: EXCISION MASS NECK;  Surgeon: Izora Gala, MD;  Location: [place];  Service: ENT;  Laterality: Left;  open excision of thyroid mass left side with frozen section   HAND SURGERY Bilateral 1995   Amputaed pointer fingers bilaterally   IR FLUORO GUIDE PORT INSERTION RIGHT  01/14/2017   IR GASTROSTOMY TUBE MOD SED  02/19/2017   IR GASTROSTOMY TUBE REMOVAL  12/07/2018   IR PATIENT EVAL TECH 0-60 MINS  01/14/2019   IR REMOVAL TUN ACCESS W/ PORT W/O FL MOD SED  11/30/2017   IR REPLACE G-TUBE SIMPLE WO FLUORO  08/03/2017   IR REPLACE G-TUBE SIMPLE WO FLUORO  06/10/2018   IR REPLC GASTRO/COLONIC TUBE PERCUT W/FLUORO  02/25/2017   IR REPLC GASTRO/COLONIC TUBE PERCUT W/FLUORO  03/09/2017   IR US GUIDE BX ASP/DRAIN  12/07/2018   IR US GUIDE VASC ACCESS RIGHT  01/14/2017   JOINT REPLACEMENT     LAPAROSCOPIC GASTRIC RESECTION N/A 09/09/2019   Procedure: LAPAROSCOPIC CLOSURE OF GASTRO-CUTANEOUS FISTULA, EXCISION OF FISTUAL TRACK;  Surgeon: Alphonsa Overall, MD;  [facility];  Service: General;  Laterality: N/A;   TOTAL HIP ARTHROPLASTY  12/16/2011   right hip   TOTAL HIP ARTHROPLASTY  01/20/2012   Procedure: TOTAL HIP ARTHROPLASTY ANTERIOR APPROACH;  Surgeon: Mauri Pole, MD;  [facility];  Service: Orthopedics;  Laterality: Left;   TOTAL KNEE ARTHROPLASTY Right 05/31/2018   Procedure: TOTAL KNEE ARTHROPLASTY;  Surgeon: Gaynelle Arabian, MD;  [facility];  Service: Orthopedics;  Laterality: Right;  Adductor Block   TUBAL LIGATION      VASCULAR SURGERY     both hands   Family History  Problem Relation Age of Onset   Cancer Mother        pt unaware of what kind   Hearing loss Mother    Coronary artery disease Father    Diabetes Father    Coronary artery disease Brother    Coronary artery disease Brother    Diabetes Sister    Cancer Sister        breast   Colon cancer Neg Hx    Stomach cancer Neg Hx    Rectal cancer Neg Hx    Esophageal cancer Neg Hx    Social History   Socioeconomic History   Marital status: Married    Spouse name: Not on file   Number of children: Not on file   Years of education: Not on file  Highest education level: Not on file  Occupational History   Not on file  Tobacco Use   Smoking status: Former    Packs/day: 1.00    Years: 20.00    Total pack years: 20.00    Types: Cigarettes    Quit date: 04/21/1993    Years since quitting: 28.4   Smokeless tobacco: Never  Vaping Use   Vaping Use: Never used  Substance and Sexual Activity   Alcohol use: Not Currently    Comment: OCCAS - MAYBE ONCE A MONTH   Drug use: No   Sexual activity: Not Currently  Other Topics Concern   Not on file  Social History Narrative   Not on file   Social Determinants of Health   Financial Resource Strain: Low Risk  (10/10/2021)   Overall Financial Resource Strain (CARDIA)    Difficulty of Paying Living Expenses: Not hard at all  Food Insecurity: No Food Insecurity (10/10/2021)   Hunger Vital Sign    Worried About Running Out of Food in the Last Year: Never true    Ran Out of Food in the Last Year: Never true  Transportation Needs: No Transportation Needs (10/10/2021)   PRAPARE - Hydrologist (Medical): No    Lack of Transportation (Non-Medical): No  Physical Activity: Sufficiently Active (10/10/2021)   Exercise Vital Sign    Days of Exercise per Week: 5 days    Minutes of Exercise per Session: 60 min  Stress: No Stress Concern Present (10/10/2021)   [place]    Feeling of Stress : Not at all  Social Connections: Unknown (10/10/2021)   Social Connection and Isolation Panel [NHANES]    Frequency of Communication with Friends and Family: More than three times a week    Frequency of Social Gatherings with Friends and Family: More than three times a week    Attends Religious Services: Never    Marine scientist or Organizations: Yes    Attends Music therapist: More than 4 times per year    Marital Status: Not on file    Tobacco Counseling Counseling given: Not Answered   Clinical Intake:  Pre-visit preparation completed: No Diabetic?  No  Interpreter Needed?: No Activities of Daily Living    10/10/2021   10:43 AM  In your present state of health, do Kristen have any difficulty performing the following activities:  Hearing? 0  Vision? 0  Difficulty concentrating or making decisions? 0  Walking or climbing stairs? 0  Dressing or bathing? 0  Preparing Food and eating ? N  Using the Toilet? N  In the past six months, have Kristen accidently leaked urine? N  Do Kristen have problems with loss of bowel control? N  Managing your Medications? N  Managing your Finances? N  Housekeeping or managing your Housekeeping? N    Patient Care Team: Eulas Post, MD as PCP - General (Family Medicine) Marcy Panning, MD as Consulting Physician (Internal Medicine) Thea Silversmith, MD as Consulting Physician (Radiation Oncology) Nicholas Lose, MD as Consulting Physician (Hematology and Oncology) Gaynelle Arabian, MD as Consulting Physician (Orthopedic Surgery) Danella Sensing, MD as Consulting Physician (Dermatology) Melissa Noon, [place] as Consulting Physician (Optometry) Ernestine Conrad, MD as Consulting Physician (Otolaryngology)  Indicate any recent Medical Services Kristen may have received from other than [facility] providers in the past year (date may be approximate).      Assessment:   This  is a routine wellness examination for Kristen Escobar.  Hearing/Vision screen Hearing Screening - Comments:: No hearing difficulty Vision Screening - Comments:: Wears reading glasses. Followed by Dr Delman Cheadle  Dietary issues and exercise activities discussed: Exercise limited by: None identified   Goals Addressed               This Visit's Progress     Patient stated (pt-stated)        Stay healthy.       Depression Screen    10/10/2021   10:41 AM 06/18/2021    8:29 AM 10/09/2020   10:38 AM 10/09/2020   10:36 AM 06/15/2020    9:13 AM 06/15/2019   10:37 AM 08/16/2018    3:05 PM  PHQ 2/9 Scores  PHQ - 2 Score 0 0 0 0 0 0 0  PHQ- 9 Score  0   2      Fall Risk    10/10/2021   10:43 AM 06/18/2021    8:30 AM 10/09/2020   10:37 AM 06/15/2020    8:37 AM 06/15/2019   10:37 AM  Fall Risk   Falls in the past year? 0 0 0 0 0  Number falls in past yr: 0 0 0  0  Injury with Fall? 0 0 0  0  Risk for fall due to : No Fall Risks No Fall Risks     Follow up  Falls evaluation completed   Falls evaluation completed;Education provided;Falls prevention discussed    FALL RISK PREVENTION PERTAINING TO THE HOME:  Any stairs in or around the home? Yes  If so, are there any without handrails? No  Home free of loose throw rugs in walkways, pet beds, electrical cords, etc? Yes  Adequate lighting in your home to reduce risk of falls? Yes   ASSISTIVE DEVICES UTILIZED TO PREVENT FALLS:  Life alert? No  Use of a cane, walker or w/c? No  Grab bars in the bathroom? No  Shower chair or bench in shower? No  Elevated toilet seat or a handicapped toilet? No   TIMED UP AND GO:  Was the test performed? No . Audio Visit  Cognitive Function:        10/10/2021   10:44 AM 06/15/2019   10:42 AM  6CIT Screen  What Year? 0 points 0 points  What month? 0 points 0 points  What time? 0 points 0 points  Count back from 20 0 points 0 points  Months in reverse 0 points 0 points  Repeat  phrase 0 points 0 points  Total Score 0 points 0 points    Immunizations Immunization History  Administered Date(s) Administered   Fluad Quad(high Dose 65+) 01/15/2019, 02/08/2020, 01/30/2021   Influenza Split 02/10/2012   Influenza, High Dose Seasonal PF 03/01/2013, 02/06/2014, 02/21/2015, 01/28/2016, 01/08/2017, 01/25/2018   PFIZER Comirnaty(Gray Top)Covid-19 Tri-Sucrose Vaccine 10/09/2020   PFIZER(Purple Top)SARS-COV-2 Vaccination 05/08/2019, 05/27/2019, 03/03/2020   Pfizer Covid-19 Vaccine Bivalent Booster 38yr & up 02/22/2021   Pneumococcal Conjugate-13 02/20/2014   Pneumococcal Polysaccharide-23 09/24/2011   Unspecified SARS-COV-2 Vaccination 05/27/2019   Zoster Recombinat (Shingrix) 06/03/2021, 08/19/2021   Zoster, Live 05/30/2015    TDAP status: Due, Education has been provided regarding the importance of this vaccine. Advised may receive this vaccine at local pharmacy or Health Dept. Aware to provide a copy of the vaccination record if obtained from local pharmacy or Health Dept. Verbalized acceptance and understanding.  Flu Vaccine status: Up to date  Pneumococcal vaccine status: Up to date  Covid-19 vaccine status: Completed vaccines  Qualifies for Shingles Vaccine? Yes   Zostavax completed Yes   Shingrix Completed?: Yes  Screening Tests Health Maintenance  Topic Date Due   TETANUS/TDAP  10/11/2022 (Originally 04/21/2016)   INFLUENZA VACCINE  11/19/2021   MAMMOGRAM  05/14/2022   COLONOSCOPY (Pts 45-23yr Insurance coverage will need to be confirmed)  02/14/2023   Pneumonia Vaccine 75 Years old  Completed   DEXA SCAN  Completed   COVID-19 Vaccine  Completed   Hepatitis C Screening  Completed   Zoster Vaccines- Shingrix  Completed   HPV VACCINES  Aged Out    Health Maintenance  There are no preventive care reminders to display for this patient.   Colorectal cancer screening: Type of screening: Colonoscopy. Completed 02/14/16. Repeat every 7  years  Mammogram status: Completed /24/23. Repeat every year  Bone Density status: Completed 04/29/21. Results reflect: Bone density results: OSTEOPOROSIS. Repeat every 2 years.  Lung Cancer Screening: (Low Dose CT Chest recommended if Age 75-80years, 30 pack-year currently smoking OR have quit w/in 15years.) does not qualify.     Additional Screening:  Hepatitis C Screening: does qualify; Completed 01/06/17  Vision Screening: Recommended annual ophthalmology exams for early detection of glaucoma and other disorders of the eye. Is the patient up to date with their annual eye exam?  Yes  Who is the provider or what is the name of the office in which the patient attends annual eye exams? Dr GDelman CheadleIf pt is not established with a provider, would they like to be referred to a provider to establish care? No .   Dental Screening: Recommended annual dental exams for proper oral hygiene  Community Resource Referral / Chronic Care Management:   CRR required this visit?  No   CCM required this visit?  No      Plan:     I have personally reviewed and noted the following in the patient's chart:   Medical and social history Use of alcohol, tobacco or illicit drugs  Current medications and supplements including opioid prescriptions.  Functional ability and status Nutritional status Physical activity Advanced directives List of other physicians Hospitalizations, surgeries, and ER visits in previous 12 months Vitals Screenings to include cognitive, depression, and falls Referrals and appointments  In addition, I have reviewed and discussed with patient certain preventive protocols, quality metrics, and best practice recommendations. A written personalized care plan for preventive services as well as general preventive health recommendations were provided to patient.     BCriselda Peaches LPN   [id number]  Nurse Notes: None

## 2021-10-27 ENCOUNTER — Other Ambulatory Visit: Payer: Self-pay | Admitting: Family Medicine

## 2021-10-27 DIAGNOSIS — E039 Hypothyroidism, unspecified: Secondary | ICD-10-CM

## 2021-10-31 DIAGNOSIS — M5451 Vertebrogenic low back pain: Secondary | ICD-10-CM | POA: Diagnosis not present

## 2021-10-31 DIAGNOSIS — M5459 Other low back pain: Secondary | ICD-10-CM | POA: Diagnosis not present

## 2021-10-31 DIAGNOSIS — M25562 Pain in left knee: Secondary | ICD-10-CM | POA: Diagnosis not present

## 2021-11-07 DIAGNOSIS — M5451 Vertebrogenic low back pain: Secondary | ICD-10-CM | POA: Diagnosis not present

## 2021-11-07 DIAGNOSIS — M25562 Pain in left knee: Secondary | ICD-10-CM | POA: Diagnosis not present

## 2021-11-12 DIAGNOSIS — L821 Other seborrheic keratosis: Secondary | ICD-10-CM | POA: Diagnosis not present

## 2021-11-12 DIAGNOSIS — D485 Neoplasm of uncertain behavior of skin: Secondary | ICD-10-CM | POA: Diagnosis not present

## 2021-11-12 DIAGNOSIS — D044 Carcinoma in situ of skin of scalp and neck: Secondary | ICD-10-CM | POA: Diagnosis not present

## 2021-11-12 DIAGNOSIS — D2371 Other benign neoplasm of skin of right lower limb, including hip: Secondary | ICD-10-CM | POA: Diagnosis not present

## 2021-11-12 DIAGNOSIS — D692 Other nonthrombocytopenic purpura: Secondary | ICD-10-CM | POA: Diagnosis not present

## 2021-11-12 DIAGNOSIS — L570 Actinic keratosis: Secondary | ICD-10-CM | POA: Diagnosis not present

## 2021-11-16 DIAGNOSIS — M5416 Radiculopathy, lumbar region: Secondary | ICD-10-CM | POA: Diagnosis not present

## 2021-12-03 DIAGNOSIS — M5451 Vertebrogenic low back pain: Secondary | ICD-10-CM | POA: Diagnosis not present

## 2021-12-03 DIAGNOSIS — M25562 Pain in left knee: Secondary | ICD-10-CM | POA: Diagnosis not present

## 2021-12-05 DIAGNOSIS — M5416 Radiculopathy, lumbar region: Secondary | ICD-10-CM | POA: Diagnosis not present

## 2021-12-20 DIAGNOSIS — M5451 Vertebrogenic low back pain: Secondary | ICD-10-CM | POA: Diagnosis not present

## 2021-12-20 DIAGNOSIS — M25562 Pain in left knee: Secondary | ICD-10-CM | POA: Diagnosis not present

## 2021-12-21 ENCOUNTER — Other Ambulatory Visit: Payer: Self-pay | Admitting: Family Medicine

## 2021-12-30 ENCOUNTER — Ambulatory Visit (INDEPENDENT_AMBULATORY_CARE_PROVIDER_SITE_OTHER): Payer: Medicare Other | Admitting: Family Medicine

## 2021-12-30 ENCOUNTER — Encounter: Payer: Self-pay | Admitting: Family Medicine

## 2021-12-30 VITALS — BP 152/64 | HR 65 | Temp 98.1°F | Ht 61.0 in | Wt 193.8 lb

## 2021-12-30 DIAGNOSIS — Z01818 Encounter for other preprocedural examination: Secondary | ICD-10-CM | POA: Diagnosis not present

## 2021-12-30 DIAGNOSIS — Z23 Encounter for immunization: Secondary | ICD-10-CM

## 2021-12-30 NOTE — Addendum Note (Signed)
Addended by: Nilda Riggs on: 12/30/2021 10:03 AM   Modules accepted: Orders

## 2021-12-30 NOTE — Progress Notes (Signed)
Established Patient Office Visit  Subjective   Patient ID: Kristen Escobar, female    DOB: October 19, 1945  Age: 76 y.o. MRN: [id number]  Chief Complaint  Patient presents with   Pre-op Exam    HPI   [name] is here for preoperative evaluation.  She is looking at lumbar decompression surgery soon.  She does have remote history of pulmonary embolus and other medical problems including hypertension, history of tracheoesophageal fistula, GERD, hypothyroidism, osteoarthritis, history of diffuse large B-cell lymphoma, history of breast cancer.  We heard a murmur last spring and sent her for echo.  This was done in March.  EF 60 to 65%.  No regional wall motion abnormalities.  Trivial mitral valve regurgitation with no evidence for mitral stenosis.  Aortic valve is tricuspid mild aortic valve regurgitation.  No stenosis.  Patient has no history of known coronary disease.  No recent chest pains.  No history of CVA.  She does have hypertension and blood pressure up today but generally has been controlled.  She takes losartan HCTZ.  They do have a home cuff for monitoring but of not been monitoring recently.  Past Medical History:  Diagnosis Date   Acute respiratory failure with hypoxia (Kristen Escobar) 02/16/2017   Arthritis    Cancer ([place]) 03/2009   breast- rt   Complication of anesthesia    Hard to void after knee replacement 05-2018   Diffuse large B cell lymphoma ([place]) dx'd 02/17/17   in remission 2 years now   had chemo    GERD (gastroesophageal reflux disease)    History of radiation therapy 07/12/10,completed   right breast 60 Gy x30 fx   Hypertension    no meds currently   Hypothyroidism    Obesity    Peripheral vascular disease ([place]) [place] HANDS AND GANGRENE OF BOTH INDEX FINGERS--REQUIRING AMPUTATION OF THE INDEX FINGERS AND VASCULAR SURGERY.  PT TOLD HER PROBLEMS RELATED TO SMOKING.   NO OTHER PROBLEMS SINCE.   Personal history of radiation therapy  2012   Right Breast Cancer   Pneumonia    Thyroid mass    Past Surgical History:  Procedure Laterality Date   AMPUTATION     partial amputation of both index fingers   BREAST LUMPECTOMY Right 04/01/2010   BREAST SURGERY  2011   lumpectomy with node sampling- RIGHT   CATARACT EXTRACTION, BILATERAL  approx 2017   COLONOSCOPY     ESOPHAGOGASTRODUODENOSCOPY     EXCISION MASS NECK Left 12/26/2016   Procedure: EXCISION MASS NECK;  Surgeon: Izora Gala, MD;  Location: [facility];  Service: ENT;  Laterality: Left;  open excision of thyroid mass left side with frozen section   HAND SURGERY Bilateral 1995   Amputaed pointer fingers bilaterally   IR FLUORO GUIDE PORT INSERTION RIGHT  01/14/2017   IR GASTROSTOMY TUBE MOD SED  02/19/2017   IR GASTROSTOMY TUBE REMOVAL  12/07/2018   IR PATIENT EVAL TECH 0-60 MINS  01/14/2019   IR REMOVAL TUN ACCESS W/ PORT W/O FL MOD SED  11/30/2017   IR REPLACE G-TUBE SIMPLE WO FLUORO  08/03/2017   IR REPLACE G-TUBE SIMPLE WO FLUORO  06/10/2018   IR REPLC GASTRO/COLONIC TUBE PERCUT W/FLUORO  02/25/2017   IR REPLC GASTRO/COLONIC TUBE PERCUT W/FLUORO  03/09/2017   IR US GUIDE BX ASP/DRAIN  12/07/2018   IR US GUIDE VASC ACCESS RIGHT  01/14/2017   JOINT REPLACEMENT     LAPAROSCOPIC GASTRIC RESECTION  N/A 09/09/2019   Procedure: LAPAROSCOPIC CLOSURE OF GASTRO-CUTANEOUS FISTULA, EXCISION OF FISTUAL TRACK;  Surgeon: Alphonsa Overall, MD;  [facility];  Service: General;  Laterality: N/A;   TOTAL HIP ARTHROPLASTY  12/16/2011   right hip   TOTAL HIP ARTHROPLASTY  01/20/2012   Procedure: TOTAL HIP ARTHROPLASTY ANTERIOR APPROACH;  Surgeon: Mauri Pole, MD;  [facility];  Service: Orthopedics;  Laterality: Left;   TOTAL KNEE ARTHROPLASTY Right 05/31/2018   Procedure: TOTAL KNEE ARTHROPLASTY;  Surgeon: Gaynelle Arabian, MD;  [facility];  Service: Orthopedics;  Laterality: Right;  Adductor Block   TUBAL LIGATION     VASCULAR SURGERY     both hands    reports that she quit  smoking about 28 years ago. Her smoking use included cigarettes. She has a 20.00 pack-year smoking history. She has never used smokeless tobacco. She reports that she does not currently use alcohol. She reports that she does not use drugs. family history includes Cancer in her mother and sister; Coronary artery disease in her brother, brother, and father; Diabetes in her father and sister; Hearing loss in her mother. No Known Allergies  Review of Systems  Constitutional:  Negative for malaise/fatigue.  Eyes:  Negative for blurred vision.  Respiratory:  Negative for shortness of breath.   Cardiovascular:  Negative for chest pain.  Neurological:  Negative for dizziness, weakness and headaches.      Objective:     BP (!) 152/64 (BP Location: Left Arm, Patient Position: Sitting, Cuff Size: Large)   Pulse 65   Temp 98.1 F (36.7 C) (Oral)   Ht '5\' 1"'$  (1.549 m)   Wt 193 lb 12.8 oz (87.9 kg)   SpO2 98%   BMI 36.62 kg/m  BP Readings from Last 3 Encounters:  12/30/21 (!) 152/64  06/18/21 136/68  05/08/21 (!) 155/44   Wt Readings from Last 3 Encounters:  12/30/21 193 lb 12.8 oz (87.9 kg)  10/10/21 180 lb (81.6 kg)  06/18/21 180 lb 14.4 oz (82.1 kg)      Physical Exam Vitals reviewed.  Constitutional:      General: She is not in acute distress.    Appearance: She is well-developed.  Eyes:     Pupils: Pupils are equal, round, and reactive to light.  Neck:     Thyroid: No thyromegaly.     Vascular: No JVD.  Cardiovascular:     Rate and Rhythm: Normal rate and regular rhythm.     Heart sounds: Murmur heard.     No gallop.     Comments: She has soft systolic murmur heard best over the mitral valve area and slightly worse with expiration. Pulmonary:     Effort: Pulmonary effort is normal. No respiratory distress.     Breath sounds: Normal breath sounds. No wheezing or rales.  Musculoskeletal:     Cervical back: Neck supple.     Right lower leg: No edema.     Left lower leg:  No edema.  Neurological:     Mental Status: She is alert.      No results found for any visits on 12/30/21.  Last CBC Lab Results  Component Value Date   WBC 7.1 06/18/2021   HGB 12.9 06/18/2021   HCT 39.7 06/18/2021   MCV 81.4 06/18/2021   MCH 26.3 05/08/2021   RDW 16.6 (H) 06/18/2021   PLT 229.0 84/16/6063   Last metabolic panel Lab Results  Component Value Date   GLUCOSE 93 05/08/2021  NA 137 05/08/2021   K 4.3 05/08/2021   CL 103 05/08/2021   CO2 29 05/08/2021   BUN 22 05/08/2021   CREATININE 1.14 (H) 05/08/2021   GFRNONAA 51 (L) 05/08/2021   CALCIUM 9.1 05/08/2021   PHOS 3.5 02/20/2017   PROT 7.4 05/08/2021   ALBUMIN 3.7 05/08/2021   BILITOT 0.4 05/08/2021   ALKPHOS 69 05/08/2021   AST 17 05/08/2021   ALT 9 05/08/2021   ANIONGAP 5 05/08/2021   Last lipids Lab Results  Component Value Date   CHOL 152 03/25/2016   HDL 49.80 03/25/2016   LDLCALC 78 03/25/2016   LDLDIRECT 146.4 06/26/2006   TRIG 108 02/18/2017   CHOLHDL 3 03/25/2016      The ASCVD Risk score (Arnett DK, et al., 2019) failed to calculate for the following reasons:   Cannot find a previous HDL lab   Cannot find a previous total cholesterol lab    Assessment & Plan:   Patient here for preoperative assessment.  She does have hypertension treated with losartan HCTZ.  Blood pressure up today which seemed atypical for her.  She does have soft systolic murmur which was evaluated with recent echocardiogram with no significant abnormalities noted.  Trivial mitral valve regurgitation  -Check EKG-shows sinus rhythm with rate of 58 with no acute ST-T changes. -Watch sodium intake and keep less than 2500 mg daily -Monitor closely with home cuff and if consistently greater than 389 systolic over the next few weeks be in touch -Patient does have remote history of pulmonary embolus but this was when she was treated for her cancer with breast.  She is not aware of any family history of coagulopathy.   She has not had any pulmonary emboli since then.  No follow-ups on file.    Carolann Littler, MD

## 2022-01-01 DIAGNOSIS — R1314 Dysphagia, pharyngoesophageal phase: Secondary | ICD-10-CM | POA: Diagnosis not present

## 2022-01-01 DIAGNOSIS — J3801 Paralysis of vocal cords and larynx, unilateral: Secondary | ICD-10-CM | POA: Diagnosis not present

## 2022-01-01 DIAGNOSIS — J3800 Paralysis of vocal cords and larynx, unspecified: Secondary | ICD-10-CM | POA: Diagnosis not present

## 2022-01-01 DIAGNOSIS — K222 Esophageal obstruction: Secondary | ICD-10-CM | POA: Diagnosis not present

## 2022-01-13 ENCOUNTER — Ambulatory Visit: Payer: Self-pay | Admitting: Orthopedic Surgery

## 2022-01-13 DIAGNOSIS — M48062 Spinal stenosis, lumbar region with neurogenic claudication: Secondary | ICD-10-CM

## 2022-01-15 ENCOUNTER — Ambulatory Visit: Payer: Self-pay | Admitting: Orthopedic Surgery

## 2022-01-15 NOTE — H&P (Signed)
Kristen Escobar is an 76 y.o. female.   Chief Complaint: back and leg pain HPI: Reason for Visit: (normal) visit for: (back) Context: 3 1/2 months Location (Lower Extremity): lower back pain on the left Severity: pain level 8/10 Timing: constant Associated Symptoms: numbness/tingling Medications: The patient is taking ibuprofen, Tylenol and gabapentin Notes: 12/05/21 left S1 SNRB. The patient reports the injection did not help.  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  PT TOLD HER PROBLEMS RELATED TO SMOKING.   NO OTHER PROBLEMS SINCE.   Personal history of radiation therapy 2012   Right Breast Cancer   Pneumonia    Thyroid mass     Past Surgical History:  Procedure Laterality Date   AMPUTATION     partial amputation of both index fingers   BREAST LUMPECTOMY Right 04/01/2010   BREAST SURGERY  2011   lumpectomy with node sampling- RIGHT   CATARACT EXTRACTION, BILATERAL  approx 2017   COLONOSCOPY     ESOPHAGOGASTRODUODENOSCOPY     EXCISION MASS NECK Left 12/26/2016   Procedure: EXCISION MASS NECK;  Surgeon: Izora Gala, MD;  Location: [place];  Service: ENT;  Laterality: Left;  open excision of thyroid mass left side with frozen section   HAND SURGERY Bilateral 1995   Amputaed pointer fingers bilaterally   IR FLUORO GUIDE PORT INSERTION  RIGHT  01/14/2017   IR GASTROSTOMY TUBE MOD SED  02/19/2017   IR GASTROSTOMY TUBE REMOVAL  12/07/2018   IR PATIENT EVAL TECH 0-60 MINS  01/14/2019   IR REMOVAL TUN ACCESS W/ PORT W/O FL MOD SED  11/30/2017   IR REPLACE G-TUBE SIMPLE WO FLUORO  08/03/2017   IR REPLACE G-TUBE SIMPLE WO FLUORO  06/10/2018   IR REPLC GASTRO/COLONIC TUBE PERCUT W/FLUORO  02/25/2017   IR REPLC GASTRO/COLONIC TUBE PERCUT W/FLUORO  03/09/2017   IR US GUIDE BX ASP/DRAIN  12/07/2018   IR US GUIDE VASC ACCESS RIGHT  01/14/2017   JOINT REPLACEMENT     LAPAROSCOPIC GASTRIC RESECTION N/A 09/09/2019   Procedure: LAPAROSCOPIC CLOSURE OF GASTRO-CUTANEOUS FISTULA, EXCISION OF FISTUAL TRACK;  Surgeon: Alphonsa Overall, MD;  Location: [facility];  Service: General;  Laterality: N/A;   TOTAL HIP ARTHROPLASTY  12/16/2011   right hip   TOTAL HIP ARTHROPLASTY  01/20/2012   Procedure: TOTAL HIP ARTHROPLASTY ANTERIOR APPROACH;  Surgeon: Mauri Pole, MD;  Location: [facility];  Service: Orthopedics;  Laterality: Left;   TOTAL KNEE ARTHROPLASTY Right 05/31/2018   Procedure: TOTAL KNEE ARTHROPLASTY;  Surgeon: Gaynelle Arabian, MD;  Location: [facility];  Service: Orthopedics;  Laterality: Right;  Adductor Block   TUBAL LIGATION     VASCULAR SURGERY     both hands    Family History  Problem Relation Age of  Onset   Cancer Mother        pt unaware of what kind   Hearing loss Mother    Coronary artery disease Father    Diabetes Father    Coronary artery disease Brother    Coronary artery disease Brother    Diabetes Sister    Cancer Sister        breast   Colon cancer Neg Hx    Stomach cancer Neg Hx    Rectal cancer Neg Hx    Esophageal cancer Neg Hx    Social History:  reports that she quit smoking about 28 years ago. Her smoking use included cigarettes. She has a 20.00 pack-year smoking history. She has never used smokeless tobacco. She reports that she does not currently use alcohol. She reports that she does not use drugs.  Allergies: No Known  Allergies  Current meds: cyclobenzaprine 5 mg tablet gabapentin 100 mg capsule ibuprofen levothyroxine 75 mcg tablet losartan 50 mg-hydrochlorothiazide 12.5 mg tablet omeprazole 20 mg capsule,delayed release pramipexole 0.5 mg tablet predniSONE 5 mg tablets in a dose pack  Review of Systems  Constitutional: Negative.   HENT: Negative.    Eyes: Negative.   Respiratory: Negative.    Cardiovascular: Negative.   Gastrointestinal: Negative.   Musculoskeletal:  Positive for arthralgias, back pain and gait problem.  Skin: Negative.   Neurological:  Positive for weakness and numbness.    There were no vitals taken for this visit. Physical Exam Constitutional:      Appearance: Normal appearance.  HENT:     Head: Normocephalic and atraumatic.     Right Ear: External ear normal.     Left Ear: External ear normal.     Nose: Nose normal.     Mouth/Throat:     Pharynx: Oropharynx is clear.  Eyes:     Conjunctiva/sclera: Conjunctivae normal.  Cardiovascular:     Rate and Rhythm: Normal rate.     Pulses: Normal pulses.  Pulmonary:     Effort: Pulmonary effort is normal.     Breath sounds: Normal breath sounds.  Abdominal:     General: Bowel sounds are normal.  Musculoskeletal:     Cervical back: Normal range of motion.     Comments: Gait and Station: Appearance: ambulating with no assistive devices and antalgic gait.  Constitutional: General Appearance: healthy-appearing and distress (mild).  Psychiatric: Mood and Affect: active and alert.  Cardiovascular System: Edema Right: none; Dorsalis and posterior tibial pulses 2+. Edema Left: none.  Abdomen: Inspection and Palpation: non-distended and no tenderness.  Skin: Inspection and palpation: no rash.  Lumbar Spine: Inspection: normal alignment. Bony Palpation of the Lumbar Spine: tender at lumbosacral junction.. Bony Palpation of the Right Hip: no tenderness of the greater trochanter and tenderness of the SI joint; Pelvis  stable. Bony Palpation of the Left Hip: no tenderness of the greater trochanter and tenderness of the SI joint. Soft Tissue Palpation on the Right: No flank pain with percussion. Active Range of Motion: limited flexion and extention.  Motor Strength: L1 Motor Strength on the Right: hip flexion iliopsoas 5/5. L1 Motor Strength on the Left: hip flexion iliopsoas 5/5. L2-L4 Motor Strength on the Right: knee extension quadriceps 5/5. L2-L4 Motor Strength on the Left: knee extension quadriceps 5/5. L5 Motor Strength on the Right: ankle dorsiflexion tibialis anterior 5/5 and great toe extension extensor hallucis longus 5/5. L5 Motor Strength on the Left: ankle dorsiflexion tibialis anterior 5/5 and great toe extension extensor hallucis longus 5/5.  S1 Motor Strength on the Right: plantar flexion gastrocnemius 5/5. S1 Motor Strength on the Left: plantar flexion gastrocnemius 5/5.  Neurological System: Knee Reflex Right: normal (2). Knee Reflex Left: normal (2). Ankle Reflex Right: normal (2). Ankle Reflex Left: normal (2). Babinski Reflex Right: plantar reflex absent. Babinski Reflex Left: plantar reflex absent. Sensation on the Right: normal distal extremities. Sensation on the Left: normal distal extremities. Special Tests on the Right: no clonus of the ankle/knee. Special Tests on the Left: no clonus of the ankle/knee and seated straight leg raising test positive  Neurological:     Mental Status: She is alert.     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 This would include avoiding heavy lifting, prolonged standing or sitting and unsupported bending and/or twisting.  I had an  extensive discussion with the patient concerning the pathology relevant anatomy and treatment options. At this point exhausting conservative treatment and in the presence of a neurologic deficit we discussed microlumbar decompression. I discussed the risks and benefits including bleeding, infection, DVT, PE, anesthetic complications, worsening in their symptoms, improvement in their symptoms, C SF leakage, epidural fibrosis, need for future surgeries such as revision discectomy and lumbar fusion. I also indicated that this is an operation to basically decompress the nerve roots to allow recovery as opposed to fixing a herniated disc if it is encountered and that the incidence of recurrent chest disc herniation can approach 15%. Also that nerve root recovery is variable and may not recover completely. Any ligament or bone that is contributing to compressing the nerves will be removed as well.  I discussed the operative course including overnight in the hospital. Immediate ambulation. Follow-up in 2 weeks for suture removal. 6 weeks until healing of the herniation and surgical incision followed by 6 weeks of reconditioning and strengthening of the core musculature. Also discussed the need to employ the concepts of disc pressure management and core motion following the surgery to minimize the risk of recurrent disc herniation. We will obtain preoperative clearance i if necessary and proceed accordingly.  Continue with her anti-inflammatory. We discussed total knee replacement on the left. Also Lumbar decompression if still symptomatic. She is here with her husband  Patient is to call or present to the emergency room if there is any numbness or weakness to suggest worsening of their condition. In addition although rare if there is loss of bowel or bladder function such as incontinence or inability to void that this may represent a cauda equina syndrome. If noted the patient is to present immediately to the  emergency room for evaluation and treatment otherwise permanent bowel or bladder dysfunction can occur as a result.  Home exercises for her knee. Activity modification  She will need clearance from her physician for her esophagus which she has had issues in the past from an erosion. They usually do a dilation. She is scheduled to have a what sounds like an endoscopy he will give clearance and direction for anesthesia.  History of a PE while on chemotherapy.'s in 2018. She is not on a blood thinner at this point in time I felt it was all secondary to her chemotherapy.  Plan microdiscectomy hemilaminectomy decompression L5-S1  Cecilie Kicks,  PA-C for Dr Tonita Cong 01/15/2022, 8:34 AM

## 2022-01-15 NOTE — H&P (View-Only) (Signed)
Kristen Escobar is an 76 y.o. female.   Chief Complaint: back and leg pain HPI: Reason for Visit: (normal) visit for: (back) Context: 3 1/2 months Location (Lower Extremity): lower back pain on the left Severity: pain level 8/10 Timing: constant Associated Symptoms: numbness/tingling Medications: The patient is taking ibuprofen, Tylenol and gabapentin Notes: 12/05/21 left S1 SNRB. The patient reports the injection did not help.  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  PT TOLD HER PROBLEMS RELATED TO SMOKING.   NO OTHER PROBLEMS SINCE.   Personal history of radiation therapy 2012   Right Breast Cancer   Pneumonia    Thyroid mass     Past Surgical History:  Procedure Laterality Date   AMPUTATION     partial amputation of both index fingers   BREAST LUMPECTOMY Right 04/01/2010   BREAST SURGERY  2011   lumpectomy with node sampling- RIGHT   CATARACT EXTRACTION, BILATERAL  approx 2017   COLONOSCOPY     ESOPHAGOGASTRODUODENOSCOPY     EXCISION MASS NECK Left 12/26/2016   Procedure: EXCISION MASS NECK;  Surgeon: Izora Gala, MD;  Location: [place];  Service: ENT;  Laterality: Left;  open excision of thyroid mass left side with frozen section   HAND SURGERY Bilateral 1995   Amputaed pointer fingers bilaterally   IR FLUORO GUIDE PORT INSERTION  RIGHT  01/14/2017   IR GASTROSTOMY TUBE MOD SED  02/19/2017   IR GASTROSTOMY TUBE REMOVAL  12/07/2018   IR PATIENT EVAL TECH 0-60 MINS  01/14/2019   IR REMOVAL TUN ACCESS W/ PORT W/O FL MOD SED  11/30/2017   IR REPLACE G-TUBE SIMPLE WO FLUORO  08/03/2017   IR REPLACE G-TUBE SIMPLE WO FLUORO  06/10/2018   IR REPLC GASTRO/COLONIC TUBE PERCUT W/FLUORO  02/25/2017   IR REPLC GASTRO/COLONIC TUBE PERCUT W/FLUORO  03/09/2017   IR US GUIDE BX ASP/DRAIN  12/07/2018   IR US GUIDE VASC ACCESS RIGHT  01/14/2017   JOINT REPLACEMENT     LAPAROSCOPIC GASTRIC RESECTION N/A 09/09/2019   Procedure: LAPAROSCOPIC CLOSURE OF GASTRO-CUTANEOUS FISTULA, EXCISION OF FISTUAL TRACK;  Surgeon: Alphonsa Overall, MD;  [facility];  Service: General;  Laterality: N/A;   TOTAL HIP ARTHROPLASTY  12/16/2011   right hip   TOTAL HIP ARTHROPLASTY  01/20/2012   Procedure: TOTAL HIP ARTHROPLASTY ANTERIOR APPROACH;  Surgeon: Mauri Pole, MD;  [facility];  Service: Orthopedics;  Laterality: Left;   TOTAL KNEE ARTHROPLASTY Right 05/31/2018   Procedure: TOTAL KNEE ARTHROPLASTY;  Surgeon: Gaynelle Arabian, MD;  [facility];  Service: Orthopedics;  Laterality: Right;  Adductor Block   TUBAL LIGATION     VASCULAR SURGERY     both hands    Family History  Problem Relation Age of  Onset   Cancer Mother        pt unaware of what kind   Hearing loss Mother    Coronary artery disease Father    Diabetes Father    Coronary artery disease Brother    Coronary artery disease Brother    Diabetes Sister    Cancer Sister        breast   Colon cancer Neg Hx    Stomach cancer Neg Hx    Rectal cancer Neg Hx    Esophageal cancer Neg Hx    Social History:  reports that she quit smoking about 28 years ago. Her smoking use included cigarettes. She has a 20.00 pack-year smoking history. She has never used smokeless tobacco. She reports that she does not currently use alcohol. She reports that she does not use drugs.  Allergies: No Known  Allergies  Current meds: cyclobenzaprine 5 mg tablet gabapentin 100 mg capsule ibuprofen levothyroxine 75 mcg tablet losartan 50 mg-hydrochlorothiazide 12.5 mg tablet omeprazole 20 mg capsule,delayed release pramipexole 0.5 mg tablet predniSONE 5 mg tablets in a dose pack  Review of Systems  Constitutional: Negative.   HENT: Negative.    Eyes: Negative.   Respiratory: Negative.    Cardiovascular: Negative.   Gastrointestinal: Negative.   Musculoskeletal:  Positive for arthralgias, back pain and gait problem.  Skin: Negative.   Neurological:  Positive for weakness and numbness.    There were no vitals taken for this visit. Physical Exam Constitutional:      Appearance: Normal appearance.  HENT:     Head: Normocephalic and atraumatic.     Right Ear: External ear normal.     Left Ear: External ear normal.     Nose: Nose normal.     Mouth/Throat:     Pharynx: Oropharynx is clear.  Eyes:     Conjunctiva/sclera: Conjunctivae normal.  Cardiovascular:     Rate and Rhythm: Normal rate.     Pulses: Normal pulses.  Pulmonary:     Effort: Pulmonary effort is normal.     Breath sounds: Normal breath sounds.  Abdominal:     General: Bowel sounds are normal.  Musculoskeletal:     Cervical back: Normal range of motion.     Comments: Gait and Station: Appearance: ambulating with no assistive devices and antalgic gait.  Constitutional: General Appearance: healthy-appearing and distress (mild).  Psychiatric: Mood and Affect: active and alert.  Cardiovascular System: Edema Right: none; Dorsalis and posterior tibial pulses 2+. Edema Left: none.  Abdomen: Inspection and Palpation: non-distended and no tenderness.  Skin: Inspection and palpation: no rash.  Lumbar Spine: Inspection: normal alignment. Bony Palpation of the Lumbar Spine: tender at lumbosacral junction.. Bony Palpation of the Right Hip: no tenderness of the greater trochanter and tenderness of the SI joint; Pelvis  stable. Bony Palpation of the Left Hip: no tenderness of the greater trochanter and tenderness of the SI joint. Soft Tissue Palpation on the Right: No flank pain with percussion. Active Range of Motion: limited flexion and extention.  Motor Strength: L1 Motor Strength on the Right: hip flexion iliopsoas 5/5. L1 Motor Strength on the Left: hip flexion iliopsoas 5/5. L2-L4 Motor Strength on the Right: knee extension quadriceps 5/5. L2-L4 Motor Strength on the Left: knee extension quadriceps 5/5. L5 Motor Strength on the Right: ankle dorsiflexion tibialis anterior 5/5 and great toe extension extensor hallucis longus 5/5. L5 Motor Strength on the Left: ankle dorsiflexion tibialis anterior 5/5 and great toe extension extensor hallucis longus 5/5.  S1 Motor Strength on the Right: plantar flexion gastrocnemius 5/5. S1 Motor Strength on the Left: plantar flexion gastrocnemius 5/5.  Neurological System: Knee Reflex Right: normal (2). Knee Reflex Left: normal (2). Ankle Reflex Right: normal (2). Ankle Reflex Left: normal (2). Babinski Reflex Right: plantar reflex absent. Babinski Reflex Left: plantar reflex absent. Sensation on the Right: normal distal extremities. Sensation on the Left: normal distal extremities. Special Tests on the Right: no clonus of the ankle/knee. Special Tests on the Left: no clonus of the ankle/knee and seated straight leg raising test positive  Neurological:     Mental Status: She is alert.     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 This would include avoiding heavy lifting, prolonged standing or sitting and unsupported bending and/or twisting.  I had an  extensive discussion with the patient concerning the pathology relevant anatomy and treatment options. At this point exhausting conservative treatment and in the presence of a neurologic deficit we discussed microlumbar decompression. I discussed the risks and benefits including bleeding, infection, DVT, PE, anesthetic complications, worsening in their symptoms, improvement in their symptoms, C SF leakage, epidural fibrosis, need for future surgeries such as revision discectomy and lumbar fusion. I also indicated that this is an operation to basically decompress the nerve roots to allow recovery as opposed to fixing a herniated disc if it is encountered and that the incidence of recurrent chest disc herniation can approach 15%. Also that nerve root recovery is variable and may not recover completely. Any ligament or bone that is contributing to compressing the nerves will be removed as well.  I discussed the operative course including overnight in the hospital. Immediate ambulation. Follow-up in 2 weeks for suture removal. 6 weeks until healing of the herniation and surgical incision followed by 6 weeks of reconditioning and strengthening of the core musculature. Also discussed the need to employ the concepts of disc pressure management and core motion following the surgery to minimize the risk of recurrent disc herniation. We will obtain preoperative clearance i if necessary and proceed accordingly.  Continue with her anti-inflammatory. We discussed total knee replacement on the left. Also Lumbar decompression if still symptomatic. She is here with her husband  Patient is to call or present to the emergency room if there is any numbness or weakness to suggest worsening of their condition. In addition although rare if there is loss of bowel or bladder function such as incontinence or inability to void that this may represent a cauda equina syndrome. If noted the patient is to present immediately to the  emergency room for evaluation and treatment otherwise permanent bowel or bladder dysfunction can occur as a result.  Home exercises for her knee. Activity modification  She will need clearance from her physician for her esophagus which she has had issues in the past from an erosion. They usually do a dilation. She is scheduled to have a what sounds like an endoscopy he will give clearance and direction for anesthesia.  History of a PE while on chemotherapy.'s in 2018. She is not on a blood thinner at this point in time I felt it was all secondary to her chemotherapy.  Plan microdiscectomy hemilaminectomy decompression L5-S1  Cecilie Kicks,  PA-C for Dr Tonita Cong 01/15/2022, 8:34 AM

## 2022-01-20 NOTE — Pre-Procedure Instructions (Signed)
Surgical Instructions    Your procedure is scheduled on Thursday, October 12th.  Report to [facility] Main Entrance "A" at 09:20 A.M., then check in with the Admitting office.  Call this number if you have problems the morning of surgery:  [phone]   If you have any questions prior to your surgery date call [phone]: Open Monday-Friday 8am-4pm    Remember:  Do not eat after midnight the night before your surgery  You may drink clear liquids until 08:20 AM the morning of your surgery.   Clear liquids allowed are: Water, Non-Citrus Juices (without pulp), Carbonated Beverages, Clear Tea, Black Coffee Only (NO MILK, CREAM OR POWDERED CREAMER of any kind), and Gatorade.   Patient Instructions  The night before surgery:  No food after midnight. ONLY clear liquids after midnight  The day of surgery (if you do NOT have diabetes):  Drink ONE (1) Pre-Surgery Clear Ensure by 08:20 AM the morning of surgery. Drink in one sitting. Do not sip.  This drink was given to you during your hospital  pre-op appointment visit.  Nothing else to drink after completing the  Pre-Surgery Clear Ensure.          If you have questions, please contact your surgeon's office.     Take these medicines the morning of surgery with A SIP OF WATER  levothyroxine (SYNTHROID)  omeprazole (PRILOSEC)   If needed: acetaminophen (TYLENOL)   As of today, STOP taking any Aspirin (unless otherwise instructed by your surgeon) Aleve, Naproxen, Ibuprofen, Motrin, Advil, Goody's, BC's, all herbal medications, fish oil, and all vitamins.                     Do NOT Smoke (Tobacco/Vaping) for 24 hours prior to your procedure.  If you use a CPAP at night, you may bring your mask/headgear for your overnight stay.   Contacts, glasses, piercing's, hearing aid's, dentures or partials may not be worn into surgery, please bring cases for these belongings.    For patients admitted to the hospital, discharge time will  be determined by your treatment team.   Patients discharged the day of surgery will not be allowed to drive home, and someone needs to stay with them for 24 hours.  SURGICAL WAITING ROOM VISITATION Patients having surgery or a procedure may have no more than 2 support people in the waiting area - these visitors may rotate.   Children under the age of 27 must have an adult with them who is not the patient. If the patient needs to stay at the hospital during part of their recovery, the visitor guidelines for inpatient rooms apply. Pre-op nurse will coordinate an appropriate time for 1 support person to accompany patient in pre-op.  This support person may not rotate.   Please refer to the [facility] website for the visitor guidelines for Inpatients (after your surgery is over and you are in a regular room).    Special instructions:   [facility]- Preparing For Surgery  Before surgery, you can play an important role. Because skin is not sterile, your skin needs to be as free of germs as possible. You can reduce the number of germs on your skin by washing with CHG (chlorahexidine gluconate) Soap before surgery.  CHG is an antiseptic cleaner which kills germs and bonds with the skin to continue killing germs even after washing.    Oral Hygiene is also important to reduce your risk of infection.  Remember - BRUSH YOUR  TEETH THE MORNING OF SURGERY WITH YOUR REGULAR TOOTHPASTE  Please do not use if you have an allergy to CHG or antibacterial soaps. If your skin becomes reddened/irritated stop using the CHG.  Do not shave (including legs and underarms) for at least 48 hours prior to first CHG shower. It is OK to shave your face.  Please follow these instructions carefully.   Shower the NIGHT BEFORE SURGERY and the MORNING OF SURGERY  If you chose to wash your hair, wash your hair first as usual with your normal shampoo.  After you shampoo, rinse your hair and body thoroughly to remove the  shampoo.  Use CHG Soap as you would any other liquid soap. You can apply CHG directly to the skin and wash gently with a scrungie or a clean washcloth.   Apply the CHG Soap to your body ONLY FROM THE NECK DOWN.  Do not use on open wounds or open sores. Avoid contact with your eyes, ears, mouth and genitals (private parts). Wash Face and genitals (private parts)  with your normal soap.   Wash thoroughly, paying special attention to the area where your surgery will be performed.  Thoroughly rinse your body with warm water from the neck down.  DO NOT shower/wash with your normal soap after using and rinsing off the CHG Soap.  Pat yourself dry with a CLEAN TOWEL.  Wear CLEAN PAJAMAS to bed the night before surgery  Place CLEAN SHEETS on your bed the night before your surgery  DO NOT SLEEP WITH PETS.   Day of Surgery: Take a shower with CHG soap. Do not wear jewelry or makeup Do not wear lotions, powders, perfumes, or deodorant. Do not shave 48 hours prior to surgery.   Do not bring valuables to the hospital. [facility] is not responsible for any belongings or valuables. Do not wear nail polish, gel polish, artificial nails, or any other type of covering on natural nails (fingers and toes) If you have artificial nails or gel coating that need to be removed by a nail salon, please have this removed prior to surgery. Artificial nails or gel coating may interfere with anesthesia's ability to adequately monitor your vital signs. Wear Clean/Comfortable clothing the morning of surgery Remember to brush your teeth WITH YOUR REGULAR TOOTHPASTE.   Please read over the following fact sheets that you were given.    If you received a COVID test during your pre-op visit  it is requested that you wear a mask when out in public, stay away from anyone that may not be feeling well and notify your surgeon if you develop symptoms. If you have been in contact with anyone that has tested positive in the  last 10 days please notify you surgeon.

## 2022-01-22 ENCOUNTER — Encounter (HOSPITAL_COMMUNITY): Payer: Self-pay

## 2022-01-22 ENCOUNTER — Ambulatory Visit (INDEPENDENT_AMBULATORY_CARE_PROVIDER_SITE_OTHER): Payer: Medicare Other | Admitting: Family Medicine

## 2022-01-22 ENCOUNTER — Encounter (HOSPITAL_COMMUNITY)
Admission: RE | Admit: 2022-01-22 | Discharge: 2022-01-22 | Disposition: A | Discharge status: Home / Self Care | Payer: Medicare Other | Source: Ambulatory Visit | Attending: Specialist | Admitting: Specialist

## 2022-01-22 ENCOUNTER — Encounter: Payer: Self-pay | Admitting: Family Medicine

## 2022-01-22 ENCOUNTER — Other Ambulatory Visit: Payer: Self-pay

## 2022-01-22 ENCOUNTER — Ambulatory Visit (HOSPITAL_COMMUNITY)
Admission: RE | Admit: 2022-01-22 | Discharge: 2022-01-22 | Disposition: A | Discharge status: Home / Self Care | Payer: Medicare Other | Source: Ambulatory Visit | Attending: Orthopedic Surgery | Admitting: Orthopedic Surgery

## 2022-01-22 VITALS — BP 172/60 | HR 66 | Temp 97.9°F | Wt 192.0 lb

## 2022-01-22 VITALS — BP 184/64 | HR 69 | Temp 98.2°F | Resp 17 | Ht 61.0 in | Wt 191.5 lb

## 2022-01-22 DIAGNOSIS — I739 Peripheral vascular disease, unspecified: Secondary | ICD-10-CM | POA: Insufficient documentation

## 2022-01-22 DIAGNOSIS — N1831 Chronic kidney disease, stage 3a: Secondary | ICD-10-CM | POA: Diagnosis not present

## 2022-01-22 DIAGNOSIS — I10 Essential (primary) hypertension: Secondary | ICD-10-CM

## 2022-01-22 DIAGNOSIS — M4807 Spinal stenosis, lumbosacral region: Secondary | ICD-10-CM | POA: Insufficient documentation

## 2022-01-22 DIAGNOSIS — Z01818 Encounter for other preprocedural examination: Secondary | ICD-10-CM | POA: Diagnosis not present

## 2022-01-22 DIAGNOSIS — Z87891 Personal history of nicotine dependence: Secondary | ICD-10-CM | POA: Insufficient documentation

## 2022-01-22 DIAGNOSIS — M4316 Spondylolisthesis, lumbar region: Secondary | ICD-10-CM | POA: Diagnosis not present

## 2022-01-22 DIAGNOSIS — Z853 Personal history of malignant neoplasm of breast: Secondary | ICD-10-CM | POA: Insufficient documentation

## 2022-01-22 DIAGNOSIS — K219 Gastro-esophageal reflux disease without esophagitis: Secondary | ICD-10-CM | POA: Insufficient documentation

## 2022-01-22 DIAGNOSIS — M5127 Other intervertebral disc displacement, lumbosacral region: Secondary | ICD-10-CM | POA: Diagnosis not present

## 2022-01-22 DIAGNOSIS — Z8572 Personal history of non-Hodgkin lymphomas: Secondary | ICD-10-CM | POA: Insufficient documentation

## 2022-01-22 DIAGNOSIS — E079 Disorder of thyroid, unspecified: Secondary | ICD-10-CM | POA: Insufficient documentation

## 2022-01-22 DIAGNOSIS — E039 Hypothyroidism, unspecified: Secondary | ICD-10-CM | POA: Diagnosis not present

## 2022-01-22 DIAGNOSIS — J3801 Paralysis of vocal cords and larynx, unilateral: Secondary | ICD-10-CM | POA: Diagnosis not present

## 2022-01-22 DIAGNOSIS — J860 Pyothorax with fistula: Secondary | ICD-10-CM | POA: Diagnosis not present

## 2022-01-22 DIAGNOSIS — E669 Obesity, unspecified: Secondary | ICD-10-CM | POA: Diagnosis not present

## 2022-01-22 DIAGNOSIS — M48062 Spinal stenosis, lumbar region with neurogenic claudication: Secondary | ICD-10-CM | POA: Diagnosis not present

## 2022-01-22 DIAGNOSIS — R1310 Dysphagia, unspecified: Secondary | ICD-10-CM | POA: Insufficient documentation

## 2022-01-22 HISTORY — DX: Paralysis of vocal cords and larynx, unspecified: J38.00

## 2022-01-22 HISTORY — DX: Cardiac murmur, unspecified: R01.1

## 2022-01-22 LAB — CBC
HCT: 38.1 % (ref 36.0–46.0)
Hemoglobin: 12.8 g/dL (ref 12.0–15.0)
MCH: 28.3 pg (ref 26.0–34.0)
MCHC: 33.6 g/dL (ref 30.0–36.0)
MCV: 84.3 fL (ref 80.0–100.0)
Platelets: 198 10*3/uL (ref 150–400)
RBC: 4.52 MIL/uL (ref 3.87–5.11)
RDW: 15 % (ref 11.5–15.5)
WBC: 7.7 10*3/uL (ref 4.0–10.5)
nRBC: 0 % (ref 0.0–0.2)

## 2022-01-22 LAB — BASIC METABOLIC PANEL
Anion gap: 10 (ref 5–15)
BUN: 27 mg/dL — ABNORMAL HIGH (ref 8–23)
CO2: 24 mmol/L (ref 22–32)
Calcium: 9.4 mg/dL (ref 8.9–10.3)
Chloride: 98 mmol/L (ref 98–111)
Creatinine, Ser: 1.14 mg/dL — ABNORMAL HIGH (ref 0.44–1.00)
GFR, Estimated: 50 mL/min — ABNORMAL LOW (ref >60)
Glucose, Bld: 97 mg/dL (ref 70–99)
Potassium: 4.9 mmol/L (ref 3.5–5.1)
Sodium: 132 mmol/L — ABNORMAL LOW (ref 135–145)

## 2022-01-22 LAB — SURGICAL PCR SCREEN
MRSA, PCR: NEGATIVE
Staphylococcus aureus: NEGATIVE

## 2022-01-22 MED ORDER — AMLODIPINE BESYLATE 5 MG PO TABS: 5.0000 mg | ORAL_TABLET | Freq: Every day | ORAL | 0 refills | Status: DC

## 2022-01-22 NOTE — Progress Notes (Addendum)
PCP - Carolann Littler, MD Cardiologist - denies  PPM/ICD - denies Device Orders - n/a Rep Notified - n/a  Chest x-ray - n/a EKG - 12/30/21 Stress Test - denies ECHO - 07/03/21 Cardiac Cath - denies Lumbar x-ray - 01/22/22  Sleep Study - denies CPAP - n/a  Fasting Blood Sugar - n/a  Blood Thinner Instructions: n/a  Aspirin Instructions: Patient was instructed: As of today, STOP taking any Aspirin (unless otherwise instructed by your surgeon) Aleve, Naproxen, Ibuprofen, Motrin, Advil, Goody's, BC's, all herbal medications, fish oil, and all vitamins  ERAS Protcol - yes PRE-SURGERY Ensure - yes, until 08:20 o'clock  COVID TEST- n/a  Anesthesia review: yes - history of heart murmur, esophageal trachea fistula. In PAT BP was elevated when patient arrived 191/56. Patient denied any distress. Patient said that she had at 8 AM Hyzaar 50-12.5. After appointment, BP was still elevated 225/54 (automatic) and 184/64 (manual). Patient verbalized that she is checking BP at home and in general is around 150/50. Patient thinks that her machine is not working properly because when she saw her PCP last time BP was higher than at home.  Patient was instructed to go today to see her PCP and show to him BPs in PAT. Patient verbalized understanding.   Patient denies shortness of breath, fever, cough and chest pain at PAT appointment   All instructions explained to the patient, with a verbal understanding of the material. Patient agrees to go over the instructions while at home for a better understanding. Patient also instructed to self quarantine after being tested for COVID-19. The opportunity to ask questions was provided.

## 2022-01-22 NOTE — Progress Notes (Signed)
   Subjective:    Patient ID: Kristen Escobar, female    DOB: 10/02/1945, 76 y.o.   MRN: [id number]  HPI Here with her husband to discuss her BP.  She has been taking Losartan HCT 50-12.5 for a number of years, and her BP has been stable until recently. She she saw Dr. Elease Hashimoto, her PCP, on 12-30-21 the BP was 152/64. They have been checking this at home since then, and the readings have run from 148 to 165 over 60s and 70s. She is scheduled for a lumbar discectomy per Dr. Tonita Cong on 01-30-22, and she saw them this morning for a pre-op visit. Her BP went as high as 205/64. They said she would need to see [place] to get this down. She denies any headache or dizziness or chest pain or SOB. She has mild renal disease, and labs from this morning showed a creatinine of 1.14 and a GFR of 50.    Review of Systems  Constitutional: Negative.   Respiratory: Negative.    Cardiovascular: Negative.   Musculoskeletal:  Positive for back pain.       Objective:   Physical Exam Constitutional:      Appearance: Normal appearance. She is not ill-appearing.  Cardiovascular:     Rate and Rhythm: Normal rate and regular rhythm.     Pulses: Normal pulses.     Heart sounds: Normal heart sounds.  Pulmonary:     Effort: Pulmonary effort is normal.     Breath sounds: Normal breath sounds.  Musculoskeletal:     Right lower leg: No edema.     Left lower leg: No edema.  Neurological:     Mental Status: She is alert.           Assessment & Plan:  Uncontrolled systolic HTN. We will add Amlodipine 5 mg daily to the Losartan HCT. They will continue to monitor the BP at home. Follow up with Dr. Elease Hashimoto next week to see if the surgery can proceed as planned. As far as the CKD goes, she agreed to stay off all NSAIDs and to use only Tylenol for pain relief.  Alysia Penna, MD

## 2022-01-23 ENCOUNTER — Encounter (HOSPITAL_COMMUNITY): Payer: Self-pay

## 2022-01-23 NOTE — Anesthesia Preprocedure Evaluation (Addendum)
Anesthesia Evaluation  Patient identified by MRN, date of birth, ID band Patient awake    Reviewed: Allergy & Precautions, NPO status , Patient's Chart, lab work & pertinent test results  History of Anesthesia Complications Negative for: history of anesthetic complications  Airway Mallampati: II  TM Distance: >3 FB Neck ROM: Full    Dental  (+) Dental Advisory Given, Upper Dentures, Partial Lower   Pulmonary former smoker, PE   Pulmonary exam normal        Cardiovascular hypertension, Pt. on medications + Peripheral Vascular Disease  Normal cardiovascular exam   '23 TTE - EF 60 to 65%. Grade I diastolic dysfunction (impaired relaxation). Left atrial size was mildly dilated. Trivial mitral valve regurgitation. Aortic valve regurgitation is mild. Aortic regurgitation PHT measures 623 msec.      Neuro/Psych  Hx SDH   negative psych ROS   GI/Hepatic Neg liver ROS,GERD  Medicated and Controlled,, Hx G-C fistula    Endo/Other  Hypothyroidism   Na 132 Obesity   Renal/GU Renal InsufficiencyRenal disease     Musculoskeletal  (+) Arthritis ,    Abdominal   Peds  Hematology  Diffuse large B Cell lymphoma    Anesthesia Other Findings Left vocal cord paralysis Hx TEF   Reproductive/Obstetrics                             Anesthesia Physical Anesthesia Plan  ASA: 3  Anesthesia Plan: General   Post-op Pain Management: Tylenol PO (pre-op)*   Induction: Intravenous  PONV Risk Score and Plan: 3 and Treatment may vary due to age or medical condition, Ondansetron and Dexamethasone  Airway Management Planned: Oral ETT  Additional Equipment: None  Intra-op Plan:   Post-operative Plan: Extubation in OR  Informed Consent: I have reviewed the patients History and Physical, chart, labs and discussed the procedure including the risks, benefits and alternatives for the proposed anesthesia  with the patient or authorized representative who has indicated his/her understanding and acceptance.     Dental advisory given  Plan Discussed with: CRNA and Anesthesiologist  Anesthesia Plan Comments: (PAT note written 01/23/2022 by Myra Gianotti, PA-C. History B-cell lymphoma, s/p chemo. 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 [facility] Dr. Joya Gaskins performed transnasal flexible laryngoscopy on 01/01/22, which showed "stable laryngeal examination with known left vocal cord immobility but widely patent glottis. With respect to her upcoming orthopedic procedure, I believe she can been intubated in a straightforward fashion although I would recommend using a relatively small endotracheal tube (6-0 or 6-5)." A 7.0 ETT was used for 09/09/19 gastro-cutaneous fistula closure.   )        Anesthesia Quick Evaluation

## 2022-01-23 NOTE — Progress Notes (Addendum)
Anesthesia Chart Review:  Case: [id number] Date/Time: 01/30/22 1108   Procedure: Microdiscetomy hemilaminectomy decompression L5-S1 - 90 mins 3 C-Bed   Anesthesia type: General   Pre-op diagnosis: Herniated disc and stenosis L5-S1, stenosis L5-S1   Location: [facility] OR ROOM 66 / [place] OR   Surgeons: Susa Day, MD       DISCUSSION: Patient is a 76 year old female scheduled for the above procedure.  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 Francina Ames; G-tube 02/19/17-12/07/18, s/p laparoscopic closure of gastro-cutaneous fistula 09/09/19), osteoarthritis (right THA 12/16/11, left THA 01/20/12, right TKA 05/31/18), PAD (index finger amputations 1995 related to smoking). BMI is consistent with obesity.   Her PCP is Eulas Post, MD. She had a preoperative evaluation with him on 12/30/21.  Echo in March 2023 for murmur evaluation showed trivial MR and mild AI.  BP 152/64.  EKG then showed sinus rhythm at 58 bpm.  He advised continued home BP monitoring and to notify his office if SBP consistently > 140. Since then she had an add-on visit with Dr. Alysia Penna on 01/22/22 after PAT BP readings elevated at 191/56->225/54->184/64. She reported home BP typically ~ 150/50, but wondered if machine was malfunctioning. Amlodipine 5 mg daily was added to her regimen of Losartan HCT 50-12.5 mg.  She has follow-up with Dr. Elease Hashimoto on 01/29/2022 to recheck her  blood pressure.  She had preoperative ENT evaluation by Dr. Joya Gaskins on 01/01/22 ([facility]). He noted her tracheoesophageal fistula was pinpoint at time of her repeat esophageal dilation and vocal cord injection in January 2020. Added that her fistula eventually closed, and her solid food dysphagia had been managed conservatively with intermittent esophageal dilations. On 01/01/22, he performed a transnasal flexible laryngoscopy which showed "stable laryngeal examination with known left vocal cord immobility but widely patent glottis. With respect to her upcoming orthopedic procedure, I believe she can been intubated in a straightforward fashion although I would recommend using a relatively small endotracheal tube (6-0 or 6-5)." Of note, a 7.0 ETT was used for 09/09/19 gastro-cutaneous fistula closure.  Reviewed ENT recommendations with anesthesiologist Suzette Battiest, MD.   She is for BP recheck with PCP on 01/29/22. If overall BP trends felt acceptable then she anticipate she can proceed with surgery as planned. I left a message for Judeen Hammans at Dr. Reather Littler office regarding BP readings and PCP follow-up. Her lumbar xray report is still in process.  Anesthesia team to evaluate on the day of surgery.  ADDENDUM 01/29/22 12:41 PM: She had HTN follow-up with Dr. Elease Hashimoto this morning. She reported home SBP readings consistently in the 130's (occasionally 140's) range. Office BP 154/60. Overall, BP improved with additional of amlodipine. He hopes she can be more active following recovery from her back surgery. 2 month follow-up planned.      VS: BP (!) 184/64   Pulse 69   Temp 36.8 C   Resp 17   Ht '5\' 1"'$  (1.549 m)   Wt 86.9 kg   SpO2 99%   BMI 36.18 kg/m    PROVIDERS: Carolann Littler  W, MD is PCP  Carol Ada, MD is ENT Nicholas Lose, MD is Medical Oncologist   LABS: Labs reviewed: Acceptable for surgery. (all labs ordered are listed, but only abnormal results are displayed)  Labs Reviewed   BASIC METABOLIC PANEL - Abnormal; Notable for the following components:      Result Value   Sodium 132 (*)    BUN 27 (*)    Creatinine, Ser 1.14 (*)    GFR, Estimated 50 (*)    All other components within normal limits  SURGICAL PCR SCREEN  CBC    IMAGES: Xray L-spine 01/22/22:  IMPRESSION: 1. Grade 1 retrolisthesis of L2 on L3 and L3 on L4. 2. Moderate degenerative changes.   EKG: 12/30/21: SB at 58 bpm   CV: Echo 07/03/21: IMPRESSIONS   1. Left ventricular ejection fraction, by estimation, is 60 to 65%. The  left ventricle has normal function. The left ventricle has no regional  wall motion abnormalities. Left ventricular diastolic parameters are  consistent with Grade I diastolic  dysfunction (impaired relaxation). Elevated left ventricular end-diastolic  pressure.   2. Right ventricular systolic function is normal. The right ventricular  size is normal. There is normal pulmonary artery systolic pressure.   3. Left atrial size was mildly dilated.   4. The mitral valve is normal in structure. Trivial mitral valve  regurgitation. No evidence of mitral stenosis.   5. The aortic valve is tricuspid. Aortic valve regurgitation is mild. No  aortic stenosis is present. Aortic regurgitation PHT measures 623 msec.   6. The inferior vena cava is normal in size with greater than 50%  respiratory variability, suggesting right atrial pressure of 3 mmHg.   US Carotid 08/30/15: Impressions: Heterogeneous plaque in the carotid bifurcations. 40 to 59% bilateral ICA stenosis. Patent vertebral arteries with antegrade flow. Normal subclavian arteries, bilaterally.   Past Medical History:  Diagnosis Date   Acute respiratory failure with hypoxia ([place]) 02/16/2017   Arthritis    Cancer ([place]) 03/2009   breast- rt   Complication of anesthesia    Hard to void after knee replacement 05-2018   Diffuse large B cell lymphoma ([place]) dx'd 02/17/17   in remission 2 years now   had chemo    GERD  (gastroesophageal reflux disease)    Heart murmur    07/03/21 echo: LVEF 60-65%, grade I DD, normal PASP, trivial MR, mild AI   History of radiation therapy 07/12/10,completed   right breast 60 Gy x30 fx   Hypertension    no meds currently   Hypothyroidism    Obesity    PE (pulmonary thromboembolism) ([place]) 01/2017   in setting of chemotherapy for lymphoma   Peripheral vascular disease ([place]) 1995   PT DEVELOPED CIRCULATION PROBLEMS IN BOTH HANDS AND GANGRENE OF BOTH INDEX FINGERS--REQUIRING AMPUTATION OF THE INDEX FINGERS AND VASCULAR SURGERY.  PT TOLD HER PROBLEMS RELATED TO SMOKING.   NO OTHER PROBLEMS SINCE.   Personal history of radiation therapy 2012   Right Breast Cancer   Pneumonia    Thyroid mass    Vocal cord paralysis    left    Past Surgical History:  Procedure Laterality Date   AMPUTATION     partial amputation of both index fingers   BREAST LUMPECTOMY Right 04/01/2010   BREAST SURGERY  2011   lumpectomy with node sampling- RIGHT   CATARACT EXTRACTION, BILATERAL  approx 2017   COLONOSCOPY     ESOPHAGOGASTRODUODENOSCOPY     EXCISION MASS NECK Left  12/26/2016   Procedure: EXCISION MASS NECK;  Surgeon: Izora Gala, MD;  Location: [place];  Service: ENT;  Laterality: Left;  open excision of thyroid mass left side with frozen section   HAND SURGERY Bilateral 1995   Amputaed pointer fingers bilaterally   IR FLUORO GUIDE PORT INSERTION RIGHT  01/14/2017   IR GASTROSTOMY TUBE MOD SED  02/19/2017   IR GASTROSTOMY TUBE REMOVAL  12/07/2018   IR PATIENT EVAL TECH 0-60 MINS  01/14/2019   IR REMOVAL TUN ACCESS W/ PORT W/O FL MOD SED  11/30/2017   IR REPLACE G-TUBE SIMPLE WO FLUORO  08/03/2017   IR REPLACE G-TUBE SIMPLE WO FLUORO  06/10/2018   IR REPLC GASTRO/COLONIC TUBE PERCUT W/FLUORO  02/25/2017   IR [place] GASTRO/COLONIC TUBE PERCUT W/FLUORO  03/09/2017   IR US GUIDE BX ASP/DRAIN  12/07/2018   IR US GUIDE VASC ACCESS RIGHT  01/14/2017   JOINT REPLACEMENT     LAPAROSCOPIC  GASTRIC RESECTION N/A 09/09/2019   Procedure: LAPAROSCOPIC CLOSURE OF GASTRO-CUTANEOUS FISTULA, EXCISION OF FISTUAL TRACK;  Surgeon: Alphonsa Overall, MD;  Location: [facility];  Service: General;  Laterality: N/A;   TONSILLECTOMY     TOTAL HIP ARTHROPLASTY  12/16/2011   right hip   TOTAL HIP ARTHROPLASTY  01/20/2012   Procedure: TOTAL HIP ARTHROPLASTY ANTERIOR APPROACH;  Surgeon: Mauri Pole, MD;  Location: [facility];  Service: Orthopedics;  Laterality: Left;   TOTAL KNEE ARTHROPLASTY Right 05/31/2018   Procedure: TOTAL KNEE ARTHROPLASTY;  Surgeon: Gaynelle Arabian, MD;  Location: [facility];  Service: Orthopedics;  Laterality: Right;  Adductor Block   TUBAL LIGATION     VASCULAR SURGERY     both hands    MEDICATIONS:  acetaminophen (TYLENOL) 500 MG tablet   amLODipine (NORVASC) 5 MG tablet   COVID-19 mRNA bivalent vaccine, Pfizer, (PFIZER COVID-19 VAC BIVALENT) injection   ibuprofen (ADVIL) 200 MG tablet   levothyroxine (SYNTHROID) 75 MCG tablet   losartan-hydrochlorothiazide (HYZAAR) 50-12.5 MG tablet   Multiple Vitamins-Minerals (ADULT GUMMY PO)   omeprazole (PRILOSEC) 20 MG capsule   pramipexole (MIRAPEX) 0.5 MG tablet   Zoster Vaccine Adjuvanted [facility]) injection   No current facility-administered medications for this encounter.    Myra Gianotti, PA-C Surgical Short Stay/Anesthesiology [facility] Phone [phone] [facility] Phone [phone] 01/23/2022 3:47 PM

## 2022-01-27 DIAGNOSIS — R1314 Dysphagia, pharyngoesophageal phase: Secondary | ICD-10-CM | POA: Diagnosis not present

## 2022-01-27 DIAGNOSIS — J3800 Paralysis of vocal cords and larynx, unspecified: Secondary | ICD-10-CM | POA: Diagnosis not present

## 2022-01-27 DIAGNOSIS — K222 Esophageal obstruction: Secondary | ICD-10-CM | POA: Diagnosis not present

## 2022-01-29 ENCOUNTER — Ambulatory Visit (INDEPENDENT_AMBULATORY_CARE_PROVIDER_SITE_OTHER): Payer: Medicare Other | Admitting: Family Medicine

## 2022-01-29 ENCOUNTER — Encounter: Payer: Self-pay | Admitting: Family Medicine

## 2022-01-29 VITALS — BP 154/60 | HR 64 | Temp 97.7°F | Ht 61.0 in | Wt 190.1 lb

## 2022-01-29 DIAGNOSIS — I10 Essential (primary) hypertension: Secondary | ICD-10-CM | POA: Diagnosis not present

## 2022-01-29 MED ORDER — AMLODIPINE BESYLATE 5 MG PO TABS: 5.0000 mg | ORAL_TABLET | Freq: Every day | ORAL | 3 refills | Status: DC

## 2022-01-29 NOTE — Progress Notes (Signed)
Established Patient Office Visit  Subjective   Patient ID: Kristen Escobar, female    DOB: 08/16/1945  Age: 76 y.o. MRN: [id number]  No chief complaint on file.   HPI   Patient here for follow-up hypertension.  She has upcoming back surgery for microdiscectomy and is actually scheduled for surgery tomorrow.  She recently went for preop last Wednesday and had blood pressure readings 184/64 and 191/56.  She was unable to get in to see me last week and saw Dr. Sarajane Jews here and amlodipine 5 mg daily was added to her losartan HCTZ.  Home blood pressures have consistently been 130s since then with occasional 140 range.  No peripheral edema.  No side effects from amlodipine.  She does not drink alcohol regularly.  No recent nonsteroidals.  Tries to watch sodium intake.  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   in remission 2 years now   had chemo    GERD (gastroesophageal reflux disease)    Heart murmur    07/03/21 echo: LVEF 60-65%, grade I DD, normal PASP, trivial MR, mild AI   History of radiation therapy 07/12/10,completed   right breast 60 Gy x30 fx   Hypertension    no meds currently   Hypothyroidism    Obesity    PE (pulmonary thromboembolism) ([place]) 01/2017   in setting of chemotherapy for lymphoma   Peripheral vascular disease ([place]) 1995   PT DEVELOPED CIRCULATION PROBLEMS IN BOTH HANDS AND GANGRENE OF BOTH INDEX FINGERS--REQUIRING AMPUTATION OF THE INDEX FINGERS AND VASCULAR SURGERY.  PT TOLD HER PROBLEMS RELATED TO SMOKING.   NO OTHER PROBLEMS SINCE.   Personal history of radiation therapy 2012   Right Breast Cancer   Pneumonia    Thyroid mass    Vocal cord paralysis    left   Past Surgical History:  Procedure Laterality Date   AMPUTATION      partial amputation of both index fingers   BREAST LUMPECTOMY Right 04/01/2010   BREAST SURGERY  2011   lumpectomy with node sampling- RIGHT   CATARACT EXTRACTION, BILATERAL  approx 2017   COLONOSCOPY     ESOPHAGOGASTRODUODENOSCOPY     EXCISION MASS NECK Left 12/26/2016   Procedure: EXCISION MASS NECK;  Surgeon: Izora Gala, MD;  Location: [facility];  Service: ENT;  Laterality: Left;  open excision of thyroid mass left side with frozen section   HAND SURGERY Bilateral 1995   Amputaed pointer fingers bilaterally   IR FLUORO GUIDE PORT INSERTION RIGHT  01/14/2017   IR GASTROSTOMY TUBE MOD SED  02/19/2017   IR GASTROSTOMY TUBE REMOVAL  12/07/2018   IR PATIENT EVAL TECH 0-60 MINS  01/14/2019   IR REMOVAL TUN ACCESS W/ PORT W/O FL MOD SED  11/30/2017   IR REPLACE G-TUBE SIMPLE WO FLUORO  08/03/2017   IR REPLACE G-TUBE SIMPLE WO FLUORO  06/10/2018   IR REPLC GASTRO/COLONIC TUBE PERCUT W/FLUORO  02/25/2017   IR REPLC GASTRO/COLONIC TUBE PERCUT W/FLUORO  03/09/2017   IR US GUIDE BX ASP/DRAIN  12/07/2018   IR US GUIDE VASC ACCESS RIGHT  01/14/2017   JOINT REPLACEMENT     LAPAROSCOPIC GASTRIC RESECTION N/A 09/09/2019   Procedure: LAPAROSCOPIC CLOSURE OF GASTRO-CUTANEOUS FISTULA, EXCISION  OF FISTUAL TRACK;  Surgeon: Alphonsa Overall, MD;  Location: [facility];  Service: General;  Laterality: N/A;   TONSILLECTOMY     TOTAL HIP ARTHROPLASTY  12/16/2011   right hip   TOTAL HIP ARTHROPLASTY  01/20/2012   Procedure: TOTAL HIP ARTHROPLASTY ANTERIOR APPROACH;  Surgeon: Mauri Pole, MD;  Location: [facility];  Service: Orthopedics;  Laterality: Left;   TOTAL KNEE ARTHROPLASTY Right 05/31/2018   Procedure: TOTAL KNEE ARTHROPLASTY;  Surgeon: Gaynelle Arabian, MD;  Location: [facility];  Service: Orthopedics;  Laterality: Right;  Adductor Block   TUBAL LIGATION     VASCULAR SURGERY     both hands    reports that she quit smoking about 28 years ago. Her smoking use included cigarettes. She has a 20.00 pack-year smoking  history. She has never used smokeless tobacco. She reports that she does not currently use alcohol. She reports that she does not use drugs. family history includes Cancer in her mother and sister; Coronary artery disease in her brother, brother, and father; Diabetes in her father and sister; Hearing loss in her mother. No Known Allergies  Review of Systems  Constitutional:  Negative for malaise/fatigue.  Eyes:  Negative for blurred vision.  Respiratory:  Negative for shortness of breath.   Cardiovascular:  Negative for chest pain.  Neurological:  Negative for dizziness, weakness and headaches.      Objective:     BP (!) 154/60 (BP Location: Left Arm, Patient Position: Sitting, Cuff Size: Large)   Pulse 64   Temp 97.7 F (36.5 C) (Oral)   Ht '5\' 1"'$  (1.549 m)   Wt 190 lb 1.6 oz (86.2 kg)   SpO2 99%   BMI 35.92 kg/m  BP Readings from Last 3 Encounters:  01/29/22 (!) 154/60  01/22/22 (!) 172/60  01/22/22 (!) 184/64   Wt Readings from Last 3 Encounters:  01/29/22 190 lb 1.6 oz (86.2 kg)  01/22/22 192 lb (87.1 kg)  01/22/22 191 lb 8 oz (86.9 kg)      Physical Exam Constitutional:      Appearance: She is well-developed.  Eyes:     Pupils: Pupils are equal, round, and reactive to light.  Neck:     Thyroid: No thyromegaly.     Vascular: No JVD.  Cardiovascular:     Rate and Rhythm: Normal rate and regular rhythm.     Heart sounds:     No gallop.  Pulmonary:     Effort: Pulmonary effort is normal. No respiratory distress.     Breath sounds: Normal breath sounds. No wheezing or rales.  Musculoskeletal:     Cervical back: Neck supple.     Right lower leg: No edema.     Left lower leg: No edema.  Neurological:     Mental Status: She is alert.      No results found for any visits on 01/29/22.    The ASCVD Risk score (Arnett DK, et al., 2019) failed to calculate for the following reasons:   Cannot find a previous HDL lab   Cannot find a previous total cholesterol  lab    Assessment & Plan:   Essential hypertension-improved after addition of amlodipine 5 mg daily.  Still has slightly elevated systolic today but improved at home.  -Continue losartan/HCTZ -Continue amlodipine 5 mg daily with refill written -Hopefully she can become more active after recovery from her back surgery and eventually goal of losing some weight. -Try to keep daily sodium intake less than 2500  mg -Set up blood pressure recheck about 2 months after her surgery and sooner as needed  Return in about 2 months (around 03/31/2022).    Carolann Littler, MD

## 2022-01-29 NOTE — Patient Instructions (Signed)
Continue with Amlodipine- in addition to the Losartan HCTZ.    Try to keep daily sodium intake < 2,500 mg

## 2022-01-30 ENCOUNTER — Encounter (HOSPITAL_COMMUNITY)
Admission: RE | Disposition: A | Discharge status: Home / Self Care | Payer: Self-pay | Source: Ambulatory Visit | Attending: Specialist

## 2022-01-30 ENCOUNTER — Other Ambulatory Visit: Payer: Self-pay

## 2022-01-30 ENCOUNTER — Encounter (HOSPITAL_COMMUNITY): Payer: Self-pay | Admitting: Specialist

## 2022-01-30 ENCOUNTER — Ambulatory Visit (HOSPITAL_BASED_OUTPATIENT_CLINIC_OR_DEPARTMENT_OTHER): Payer: Medicare Other | Admitting: Anesthesiology

## 2022-01-30 ENCOUNTER — Ambulatory Visit (HOSPITAL_COMMUNITY): Payer: Medicare Other | Admitting: Physician Assistant

## 2022-01-30 ENCOUNTER — Ambulatory Visit (HOSPITAL_COMMUNITY)
Admission: RE | Admit: 2022-01-30 | Discharge: 2022-01-30 | Disposition: A | Discharge status: Home / Self Care | Payer: Medicare Other | Source: Ambulatory Visit | Attending: Specialist | Admitting: Specialist

## 2022-01-30 ENCOUNTER — Ambulatory Visit (HOSPITAL_COMMUNITY): Payer: Medicare Other

## 2022-01-30 DIAGNOSIS — M48061 Spinal stenosis, lumbar region without neurogenic claudication: Secondary | ICD-10-CM | POA: Diagnosis present

## 2022-01-30 DIAGNOSIS — E669 Obesity, unspecified: Secondary | ICD-10-CM | POA: Insufficient documentation

## 2022-01-30 DIAGNOSIS — M4807 Spinal stenosis, lumbosacral region: Secondary | ICD-10-CM | POA: Diagnosis not present

## 2022-01-30 DIAGNOSIS — I10 Essential (primary) hypertension: Secondary | ICD-10-CM | POA: Diagnosis not present

## 2022-01-30 DIAGNOSIS — M5127 Other intervertebral disc displacement, lumbosacral region: Secondary | ICD-10-CM | POA: Diagnosis not present

## 2022-01-30 DIAGNOSIS — Z87891 Personal history of nicotine dependence: Secondary | ICD-10-CM

## 2022-01-30 DIAGNOSIS — K219 Gastro-esophageal reflux disease without esophagitis: Secondary | ICD-10-CM | POA: Insufficient documentation

## 2022-01-30 DIAGNOSIS — Z981 Arthrodesis status: Secondary | ICD-10-CM | POA: Diagnosis not present

## 2022-01-30 DIAGNOSIS — M5116 Intervertebral disc disorders with radiculopathy, lumbar region: Secondary | ICD-10-CM | POA: Diagnosis not present

## 2022-01-30 DIAGNOSIS — Z01818 Encounter for other preprocedural examination: Secondary | ICD-10-CM

## 2022-01-30 DIAGNOSIS — Z6833 Body mass index (BMI) 33.0-33.9, adult: Secondary | ICD-10-CM | POA: Insufficient documentation

## 2022-01-30 DIAGNOSIS — M48062 Spinal stenosis, lumbar region with neurogenic claudication: Secondary | ICD-10-CM | POA: Diagnosis not present

## 2022-01-30 DIAGNOSIS — E039 Hypothyroidism, unspecified: Secondary | ICD-10-CM | POA: Diagnosis not present

## 2022-01-30 HISTORY — PX: LUMBAR LAMINECTOMY/DECOMPRESSION MICRODISCECTOMY: SHX5026

## 2022-01-30 SURGERY — LUMBAR LAMINECTOMY/DECOMPRESSION MICRODISCECTOMY 1 LEVEL
Anesthesia: General

## 2022-01-30 MED ORDER — BISACODYL 5 MG PO TBEC: 5.0000 mg | DELAYED_RELEASE_TABLET | Freq: Every day | ORAL | Status: DC | PRN

## 2022-01-30 MED ORDER — LACTATED RINGERS IV SOLN: INTRAVENOUS | Status: DC

## 2022-01-30 MED ORDER — ACETAMINOPHEN 325 MG PO TABS: 650.0000 mg | ORAL_TABLET | ORAL | Status: DC | PRN

## 2022-01-30 MED ORDER — METHOCARBAMOL 1000 MG/10ML IJ SOLN
500.0000 mg | Freq: Four times a day (QID) | INTRAVENOUS | Status: DC | PRN
  Filled 2022-01-30: qty 5

## 2022-01-30 MED ORDER — ACETAMINOPHEN 650 MG RE SUPP: 650.0000 mg | RECTAL | Status: DC | PRN

## 2022-01-30 MED ORDER — HYDROCHLOROTHIAZIDE 12.5 MG PO TABS: 12.5000 mg | ORAL_TABLET | Freq: Every day | ORAL | Status: DC

## 2022-01-30 MED ORDER — ONDANSETRON HCL 4 MG PO TABS: 4.0000 mg | ORAL_TABLET | Freq: Four times a day (QID) | ORAL | Status: DC | PRN

## 2022-01-30 MED ORDER — PROPOFOL 10 MG/ML IV BOLUS
INTRAVENOUS | Status: AC
  Filled 2022-01-30: qty 20

## 2022-01-30 MED ORDER — SUGAMMADEX SODIUM 200 MG/2ML IV SOLN
INTRAVENOUS | Status: DC | PRN
  Administered 2022-01-30: 169.6 mg via INTRAVENOUS

## 2022-01-30 MED ORDER — TRANEXAMIC ACID-NACL 1000-0.7 MG/100ML-% IV SOLN
1000.0000 mg | INTRAVENOUS | Status: AC
  Administered 2022-01-30: 1000 mg via INTRAVENOUS
  Filled 2022-01-30: qty 100

## 2022-01-30 MED ORDER — LIDOCAINE 2% (20 MG/ML) 5 ML SYRINGE
INTRAMUSCULAR | Status: AC
  Filled 2022-01-30: qty 5

## 2022-01-30 MED ORDER — ALUM & MAG HYDROXIDE-SIMETH 200-200-20 MG/5ML PO SUSP: 30.0000 mL | Freq: Four times a day (QID) | ORAL | Status: DC | PRN

## 2022-01-30 MED ORDER — BUPIVACAINE-EPINEPHRINE (PF) 0.5% -1:200000 IJ SOLN
INTRAMUSCULAR | Status: AC
  Filled 2022-01-30: qty 30

## 2022-01-30 MED ORDER — 0.9 % SODIUM CHLORIDE (POUR BTL) OPTIME
TOPICAL | Status: DC | PRN
  Administered 2022-01-30: 1000 mL

## 2022-01-30 MED ORDER — METHOCARBAMOL 500 MG PO TABS: 500.0000 mg | ORAL_TABLET | Freq: Four times a day (QID) | ORAL | Status: DC | PRN

## 2022-01-30 MED ORDER — CEFAZOLIN SODIUM-DEXTROSE 2-4 GM/100ML-% IV SOLN
2.0000 g | Freq: Three times a day (TID) | INTRAVENOUS | Status: DC
  Administered 2022-01-30: 2 g via INTRAVENOUS
  Filled 2022-01-30: qty 100

## 2022-01-30 MED ORDER — DOCUSATE SODIUM 100 MG PO CAPS: 100.0000 mg | ORAL_CAPSULE | Freq: Two times a day (BID) | ORAL | Status: DC

## 2022-01-30 MED ORDER — CHLORHEXIDINE GLUCONATE 0.12 % MT SOLN
15.0000 mL | Freq: Once | OROMUCOSAL | Status: AC
  Administered 2022-01-30: 15 mL via OROMUCOSAL
  Filled 2022-01-30: qty 15

## 2022-01-30 MED ORDER — TRAMADOL HCL 50 MG PO TABS: 50.0000 mg | ORAL_TABLET | Freq: Four times a day (QID) | ORAL | Status: DC | PRN

## 2022-01-30 MED ORDER — PHENYLEPHRINE 80 MCG/ML (10ML) SYRINGE FOR IV PUSH (FOR BLOOD PRESSURE SUPPORT)
PREFILLED_SYRINGE | INTRAVENOUS | Status: AC
  Filled 2022-01-30: qty 10

## 2022-01-30 MED ORDER — PRAMIPEXOLE DIHYDROCHLORIDE 0.25 MG PO TABS: 0.5000 mg | ORAL_TABLET | Freq: Every day | ORAL | Status: DC

## 2022-01-30 MED ORDER — OXYCODONE HCL 5 MG PO TABS: 5.0000 mg | ORAL_TABLET | Freq: Once | ORAL | Status: DC | PRN

## 2022-01-30 MED ORDER — ACETAMINOPHEN 500 MG PO TABS: 1000.0000 mg | ORAL_TABLET | Freq: Four times a day (QID) | ORAL | 0 refills | Status: DC | PRN

## 2022-01-30 MED ORDER — ROCURONIUM BROMIDE 10 MG/ML (PF) SYRINGE
PREFILLED_SYRINGE | INTRAVENOUS | Status: DC | PRN
  Administered 2022-01-30: 10 mg via INTRAVENOUS
  Administered 2022-01-30: 50 mg via INTRAVENOUS

## 2022-01-30 MED ORDER — LIDOCAINE 2% (20 MG/ML) 5 ML SYRINGE
INTRAMUSCULAR | Status: DC | PRN
  Administered 2022-01-30: 60 mg via INTRAVENOUS

## 2022-01-30 MED ORDER — BUPIVACAINE-EPINEPHRINE 0.5% -1:200000 IJ SOLN
INTRAMUSCULAR | Status: DC | PRN
  Administered 2022-01-30: 6 mL

## 2022-01-30 MED ORDER — CEFAZOLIN SODIUM-DEXTROSE 2-4 GM/100ML-% IV SOLN
2.0000 g | INTRAVENOUS | Status: AC
  Administered 2022-01-30: 2 g via INTRAVENOUS
  Filled 2022-01-30: qty 100

## 2022-01-30 MED ORDER — MAGNESIUM CITRATE PO SOLN
1.0000 | Freq: Once | ORAL | Status: DC | PRN
  Filled 2022-01-30: qty 296

## 2022-01-30 MED ORDER — ACETAMINOPHEN 500 MG PO TABS: 1000.0000 mg | ORAL_TABLET | ORAL | Status: DC | PRN

## 2022-01-30 MED ORDER — ONDANSETRON HCL 4 MG/2ML IJ SOLN: 4.0000 mg | Freq: Once | INTRAMUSCULAR | Status: DC | PRN

## 2022-01-30 MED ORDER — DOCUSATE SODIUM 100 MG PO CAPS: 100.0000 mg | ORAL_CAPSULE | Freq: Two times a day (BID) | ORAL | 1 refills | Status: DC | PRN

## 2022-01-30 MED ORDER — PROPOFOL 10 MG/ML IV BOLUS
INTRAVENOUS | Status: DC | PRN
  Administered 2022-01-30: 120 mg via INTRAVENOUS

## 2022-01-30 MED ORDER — THROMBIN 20000 UNITS EX SOLR
CUTANEOUS | Status: DC | PRN
  Administered 2022-01-30: 20 mL via TOPICAL

## 2022-01-30 MED ORDER — ROCURONIUM BROMIDE 10 MG/ML (PF) SYRINGE
PREFILLED_SYRINGE | INTRAVENOUS | Status: AC
  Filled 2022-01-30: qty 10

## 2022-01-30 MED ORDER — DEXAMETHASONE SODIUM PHOSPHATE 10 MG/ML IJ SOLN
INTRAMUSCULAR | Status: DC | PRN
  Administered 2022-01-30: 10 mg via INTRAVENOUS

## 2022-01-30 MED ORDER — FENTANYL CITRATE (PF) 100 MCG/2ML IJ SOLN
INTRAMUSCULAR | Status: AC
  Filled 2022-01-30: qty 2

## 2022-01-30 MED ORDER — ONDANSETRON HCL 4 MG/2ML IJ SOLN
INTRAMUSCULAR | Status: AC
  Filled 2022-01-30: qty 2

## 2022-01-30 MED ORDER — KCL IN DEXTROSE-NACL 20-5-0.45 MEQ/L-%-% IV SOLN: INTRAVENOUS | Status: DC

## 2022-01-30 MED ORDER — LOSARTAN POTASSIUM 50 MG PO TABS: 50.0000 mg | ORAL_TABLET | Freq: Every day | ORAL | Status: DC

## 2022-01-30 MED ORDER — TRAMADOL HCL 50 MG PO TABS: 50.0000 mg | ORAL_TABLET | Freq: Four times a day (QID) | ORAL | 1 refills | Status: DC | PRN

## 2022-01-30 MED ORDER — FENTANYL CITRATE (PF) 100 MCG/2ML IJ SOLN
25.0000 ug | INTRAMUSCULAR | Status: DC | PRN
  Administered 2022-01-30: 50 ug via INTRAVENOUS

## 2022-01-30 MED ORDER — FENTANYL CITRATE (PF) 250 MCG/5ML IJ SOLN
INTRAMUSCULAR | Status: DC | PRN
  Administered 2022-01-30 (×2): 50 ug via INTRAVENOUS
  Administered 2022-01-30: 25 ug via INTRAVENOUS
  Administered 2022-01-30 (×2): 50 ug via INTRAVENOUS
  Administered 2022-01-30: 25 ug via INTRAVENOUS

## 2022-01-30 MED ORDER — LOSARTAN POTASSIUM-HCTZ 50-12.5 MG PO TABS: 1.0000 | ORAL_TABLET | Freq: Every day | ORAL | Status: DC

## 2022-01-30 MED ORDER — PHENOL 1.4 % MT LIQD: 1.0000 | OROMUCOSAL | Status: DC | PRN

## 2022-01-30 MED ORDER — RISAQUAD PO CAPS
1.0000 | ORAL_CAPSULE | Freq: Every day | ORAL | Status: DC
  Filled 2022-01-30: qty 1

## 2022-01-30 MED ORDER — ONDANSETRON HCL 4 MG/2ML IJ SOLN: 4.0000 mg | Freq: Four times a day (QID) | INTRAMUSCULAR | Status: DC | PRN

## 2022-01-30 MED ORDER — PANTOPRAZOLE SODIUM 40 MG PO TBEC: 40.0000 mg | DELAYED_RELEASE_TABLET | Freq: Every day | ORAL | Status: DC

## 2022-01-30 MED ORDER — OXYCODONE HCL 5 MG/5ML PO SOLN: 5.0000 mg | Freq: Once | ORAL | Status: DC | PRN

## 2022-01-30 MED ORDER — MENTHOL 3 MG MT LOZG: 1.0000 | LOZENGE | OROMUCOSAL | Status: DC | PRN

## 2022-01-30 MED ORDER — ORAL CARE MOUTH RINSE: 15.0000 mL | Freq: Once | OROMUCOSAL | Status: AC

## 2022-01-30 MED ORDER — ACETAMINOPHEN 10 MG/ML IV SOLN
1000.0000 mg | INTRAVENOUS | Status: DC
  Filled 2022-01-30: qty 100

## 2022-01-30 MED ORDER — PHENYLEPHRINE HCL-NACL 20-0.9 MG/250ML-% IV SOLN
INTRAVENOUS | Status: DC | PRN
  Administered 2022-01-30: 15 ug/min via INTRAVENOUS

## 2022-01-30 MED ORDER — AMLODIPINE BESYLATE 5 MG PO TABS: 5.0000 mg | ORAL_TABLET | Freq: Every day | ORAL | Status: DC

## 2022-01-30 MED ORDER — PHENYLEPHRINE HCL (PRESSORS) 10 MG/ML IV SOLN
INTRAVENOUS | Status: DC | PRN
  Administered 2022-01-30: 160 ug via INTRAVENOUS
  Administered 2022-01-30 (×2): 80 ug via INTRAVENOUS
  Administered 2022-01-30: 160 ug via INTRAVENOUS
  Administered 2022-01-30 (×4): 80 ug via INTRAVENOUS

## 2022-01-30 MED ORDER — FENTANYL CITRATE (PF) 250 MCG/5ML IJ SOLN
INTRAMUSCULAR | Status: AC
  Filled 2022-01-30: qty 5

## 2022-01-30 MED ORDER — LEVOTHYROXINE SODIUM 75 MCG PO TABS: 75.0000 ug | ORAL_TABLET | Freq: Every day | ORAL | Status: DC

## 2022-01-30 MED ORDER — ONDANSETRON HCL 4 MG/2ML IJ SOLN
INTRAMUSCULAR | Status: DC | PRN
  Administered 2022-01-30: 4 mg via INTRAVENOUS

## 2022-01-30 MED ORDER — THROMBIN 20000 UNITS EX SOLR
CUTANEOUS | Status: AC
  Filled 2022-01-30: qty 20000

## 2022-01-30 MED ORDER — POLYETHYLENE GLYCOL 3350 17 G PO PACK: 17.0000 g | PACK | Freq: Every day | ORAL | 0 refills | Status: DC

## 2022-01-30 MED ORDER — DEXAMETHASONE SODIUM PHOSPHATE 10 MG/ML IJ SOLN
INTRAMUSCULAR | Status: AC
  Filled 2022-01-30: qty 1

## 2022-01-30 MED ORDER — POLYETHYLENE GLYCOL 3350 17 G PO PACK: 17.0000 g | PACK | Freq: Every day | ORAL | Status: DC | PRN

## 2022-01-30 SURGICAL SUPPLY — 56 items
BAG COUNTER SPONGE SURGICOUNT (BAG) ×1 IMPLANT
BAG DECANTER FOR FLEXI CONT (MISCELLANEOUS) IMPLANT
BAND RUBBER #18 3X1/16 STRL (MISCELLANEOUS) ×2 IMPLANT
BUR EGG ELITE 5.0 (BURR) IMPLANT
BUR RND DIAMOND ELITE 4.0 (BURR) IMPLANT
CLEANER TIP ELECTROSURG 2X2 (MISCELLANEOUS) ×1 IMPLANT
CNTNR URN SCR LID CUP LEK RST (MISCELLANEOUS) ×1 IMPLANT
CONT SPEC 4OZ STRL OR WHT (MISCELLANEOUS) ×1
DRAPE LAPAROTOMY 100X72X124 (DRAPES) ×1 IMPLANT
DRAPE MICROSCOPE SLANT 54X150 (MISCELLANEOUS) ×1 IMPLANT
DRAPE SHEET LG 3/4 BI-LAMINATE (DRAPES) ×1 IMPLANT
DRAPE SURG 17X11 SM STRL (DRAPES) ×1 IMPLANT
DRAPE UTILITY XL STRL (DRAPES) ×1 IMPLANT
DRSG AQUACEL AG ADV 3.5X 4 (GAUZE/BANDAGES/DRESSINGS) IMPLANT
DRSG AQUACEL AG ADV 3.5X 6 (GAUZE/BANDAGES/DRESSINGS) IMPLANT
DRSG TELFA 3X8 NADH STRL (GAUZE/BANDAGES/DRESSINGS) IMPLANT
DURAPREP 26ML APPLICATOR (WOUND CARE) ×1 IMPLANT
DURASEAL SPINE SEALANT 3ML (MISCELLANEOUS) IMPLANT
ELECT BLADE 4.0 EZ CLEAN MEGAD (MISCELLANEOUS)
ELECT REM PT RETURN 9FT ADLT (ELECTROSURGICAL) ×1
ELECTRODE BLDE 4.0 EZ CLN MEGD (MISCELLANEOUS) IMPLANT
ELECTRODE REM PT RTRN 9FT ADLT (ELECTROSURGICAL) ×1 IMPLANT
GLOVE BIOGEL PI IND STRL 7.5 (GLOVE) ×1 IMPLANT
GLOVE SURG SS PI 7.0 STRL IVOR (GLOVE) ×1 IMPLANT
GLOVE SURG SS PI 8.0 STRL IVOR (GLOVE) ×2 IMPLANT
GOWN STRL REUS W/ TWL LRG LVL3 (GOWN DISPOSABLE) ×1 IMPLANT
GOWN STRL REUS W/ TWL XL LVL3 (GOWN DISPOSABLE) ×1 IMPLANT
GOWN STRL REUS W/TWL LRG LVL3 (GOWN DISPOSABLE) ×1
GOWN STRL REUS W/TWL XL LVL3 (GOWN DISPOSABLE) ×1
IV CATH 14GX2 1/4 (CATHETERS) ×1 IMPLANT
KIT BASIN OR (CUSTOM PROCEDURE TRAY) ×1 IMPLANT
NDL 22X1.5 STRL (OR ONLY) (MISCELLANEOUS) ×1 IMPLANT
NDL SPNL 18GX3.5 QUINCKE PK (NEEDLE) ×2 IMPLANT
NEEDLE 22X1.5 STRL (OR ONLY) (MISCELLANEOUS) ×1 IMPLANT
NEEDLE SPNL 18GX3.5 QUINCKE PK (NEEDLE) ×2 IMPLANT
PACK LAMINECTOMY NEURO (CUSTOM PROCEDURE TRAY) ×1 IMPLANT
PATTIES SURGICAL .75X.75 (GAUZE/BANDAGES/DRESSINGS) ×1 IMPLANT
SOLUTION PRONTOSAN WOUND 350ML (IRRIGATION / IRRIGATOR) IMPLANT
SPONGE SURGIFOAM ABS GEL 100 (HEMOSTASIS) ×1 IMPLANT
SPONGE T-LAP 4X18 ~~LOC~~+RFID (SPONGE) IMPLANT
STAPLER VISISTAT (STAPLE) IMPLANT
STRIP CLOSURE SKIN 1/2X4 (GAUZE/BANDAGES/DRESSINGS) ×1 IMPLANT
SUT NURALON 4 0 TR CR/8 (SUTURE) IMPLANT
SUT PROLENE 3 0 PS 2 (SUTURE) IMPLANT
SUT VIC AB 1 CT1 27 (SUTURE)
SUT VIC AB 1 CT1 27XBRD ANTBC (SUTURE) IMPLANT
SUT VIC AB 1-0 CT2 27 (SUTURE) IMPLANT
SUT VIC AB 2-0 CT1 27 (SUTURE)
SUT VIC AB 2-0 CT1 TAPERPNT 27 (SUTURE) IMPLANT
SUT VIC AB 2-0 CT2 27 (SUTURE) IMPLANT
SYR 3ML LL SCALE MARK (SYRINGE) ×1 IMPLANT
TOWEL GREEN STERILE (TOWEL DISPOSABLE) ×1 IMPLANT
TOWEL GREEN STERILE FF (TOWEL DISPOSABLE) ×1 IMPLANT
TRAY FOLEY MTR SLVR 16FR STAT (SET/KITS/TRAYS/PACK) ×1 IMPLANT
WIPE CHG 2% 2PK PREOPERATIVE (MISCELLANEOUS) ×1 IMPLANT
YANKAUER SUCT BULB TIP NO VENT (SUCTIONS) ×1 IMPLANT

## 2022-01-30 NOTE — Anesthesia Postprocedure Evaluation (Signed)
Anesthesia Post Note  Patient: Kristen Escobar  Procedure(s) Performed: Microdiscetomy hemilaminectomy decompression Lumbar Five-Sacral One     Patient location during evaluation: PACU Anesthesia Type: General Level of consciousness: sedated Pain management: pain level controlled Vital Signs Assessment: post-procedure vital signs reviewed and stable Respiratory status: spontaneous breathing and respiratory function stable Cardiovascular status: stable Postop Assessment: no apparent nausea or vomiting Anesthetic complications: no   No notable events documented.  Last Vitals:  Vitals:   01/30/22 1409 01/30/22 1424  BP:  (!) 172/76  Pulse: 61 65  Resp: 13 18  Temp:  36.7 C  SpO2: 95% 96%    Last Pain:  Vitals:   01/30/22 1424  TempSrc: Oral  PainSc:                  Antonie Borjon DANIEL

## 2022-01-30 NOTE — Interval H&P Note (Signed)
History and Physical Interval Note:  01/30/2022 10:45 AM  Kristen Escobar  has presented today for surgery, with the diagnosis of Herniated disc and stenosis L5-S1, stenosis L5-S1.  The various methods of treatment have been discussed with the patient and family. After consideration of risks, benefits and other options for treatment, the patient has consented to  Procedure(s) with comments: Microdiscetomy hemilaminectomy decompression L5-S1 (N/A) - 90 mins 3 C-Bed as a surgical intervention.  [name] history has been reviewed, patient examined, no change in status, stable for surgery.  I have reviewed the patient's chart and labs.  Questions were answered to the patient's satisfaction.     Johnn Hai

## 2022-01-30 NOTE — Plan of Care (Signed)

## 2022-01-30 NOTE — Brief Op Note (Signed)
01/30/2022  1:01 PM  PATIENT:  Kristen Escobar  76 y.o. female  PRE-OPERATIVE DIAGNOSIS:  Herniated disc and stenosis L5-S1, stenosis L5-S1  POST-OPERATIVE DIAGNOSIS:  Herniated disc and stenosis L5-S1, stenosis L5-S1  PROCEDURE:  Procedure(s) with comments: Microdiscetomy hemilaminectomy decompression Lumbar Five-Sacral One (N/A) - 90 mins 3 C-Bed  SURGEON:  Surgeon(s) and Role:    Susa Day, MD - Primary  PHYSICIAN ASSISTANT:   ASSISTANTS: [place]   ANESTHESIA:   general  EBL:  25   BLOOD ADMINISTERED:none  DRAINS: none   LOCAL MEDICATIONS USED:  MARCAINE     SPECIMEN:  No Specimen  DISPOSITION OF SPECIMEN:  N/A  COUNTS:  YES  TOURNIQUET:  * No tourniquets in log *  DICTATION: .Other Dictation: Dictation Number [id number]  PLAN OF CARE: Admit for overnight observation  PATIENT DISPOSITION:  PACU - hemodynamically stable.   Delay start of Pharmacological VTE agent (>24hrs) due to surgical blood loss or risk of bleeding: yes

## 2022-01-30 NOTE — Discharge Instructions (Signed)

## 2022-01-30 NOTE — Op Note (Signed)
NAME: Kristen Escobar, Kristen Escobar MEDICAL RECORD NO: [id number] ACCOUNT NO: [id number] DATE OF BIRTH: [birth year] FACILITY: [facility] LOCATION: [facility] PHYSICIAN: Johnn Hai, MD  Operative Report   DATE OF PROCEDURE: 01/30/2022  PREOPERATIVE DIAGNOSIS:  Spinal stenosis, HNP, L5-S1, left.  POSTOPERATIVE DIAGNOSES:  Spinal stenosis, HNP, L5-S1, left.  PROCEDURE PERFORMED: 1.  Left hemilaminotomy, lateral recess decompression with foraminotomies, L5-S1. 2.  Microdiskectomy L5-S1.  ANESTHESIA:  General.  ASSISTANT:  Lacie Draft, PA.  HISTORY:  A 76 year old with left lower extremity radicular pain S1, L5 nerve root distribution due to disk herniation and predominantly lateral recess stenosis due to facet hypertrophy and a grade 1 spondylolisthesis.  She was indicated for  decompression of the S1 nerve root, failing conservative treatment.  Risks and benefits discussed including bleeding, infection, damage to neurovascular structures, no change in symptoms, worsening symptoms, DVT, PE, anesthetic complications, etc.  DESCRIPTION OF PROCEDURE:  With the patient in supine position.  After induction of adequate general anesthesia, 2 grams Kefzol placed prone on the Wilson frame.  All bony prominences were well padded.  Lumbar region was prepped and draped in the usual  sterile fashion.  Two 18-gauge spinal needles utilized to localize L5-S1 interspace, confirmed with x-ray.  Incision was made from the spinous process at L5-S1.  Subcutaneous tissue was dissected.  Electrocautery was utilized to achieve hemostasis.   Dorsal lumbar fascia divided in line with skin incision.  Paraspinous muscle elevated from lamina at L5-S1 on the left.  Operating microscope was draped and brought in the surgical field.  Confirmatory radiograph had been obtained.  Very small  interlaminar window was noted due to severe facet hypertrophy and arthrosis.  I used a straight curette to identify the facets.  I used an osteotome  to remove a portion of the inferior process of L5.  I then used a high-speed bur to perform a  hemilaminotomy of the caudad edge of L5 utilizing a high-speed bur approximately 1 cm of the medial aspect of the facet was removed.  Following this, I continued the hemilaminotomies with a 2 mm Kerrison, cephalad.  This detached ligamentum flavum.  I  removed ligamentum flavum from the interspace.  She had severe ligamentum flavum hypertrophy.  I then used a micro curette to detach ligamentum flavum from the cephalad edge of S1, which was near vertical.  Following that and protecting the nerve root  with a Woodson retractor I performed a generous foraminotomy of S1.  The S1 nerve root was erythematous and edematous.  There was hypertrophic venous plexus, which was cauterized as well.  I then removed a portion of the superior articulating process of  S1.  Woodson probe passed freely out the foramen of L5.  I identified a hardened disk lateral to the nerve root.  I used D'Errico and mobilized it medially.  A small fragment was excised.  This epidural venous plexus, which was cauterized as well.   Following this, the [facility] probe passed freely out the foramen at S1 and 5 above the pedicle of L5 and below the pedicle of S1.  There was no compression upon the S1 nerve root.  A hardened disk beneath it.  Extending medially without compressing the S1  nerve root.  I felt the S1 nerve root was well decompressed.  Confirmatory radiograph obtained with a Woodson in the foramen of L5 and S1.  Copiously irrigated.  No evidence of CSF leakage or active bleeding.  I placed thrombin-soaked Gelfoam in the  laminotomy defect and  then removed the thrombin-soaked Gelfoam.  We placed a very small patty of thrombin-soaked Gelfoam over foraminotomy of S1.  I felt the S1 nerve root was well decompressed.  The foramen of L5 was widely patent.  I removed the  McCulloch retractor, irrigated the paraspinous musculature.  No active  bleeding, I closed the dorsal lumbar fascia with #1 Vicryl in an interrupted figure-of-eight sutures, subcutaneous with 2-0 and skin with Prolene.  Sterile dressing applied.  Placed  supine on the hospital bed, extubated without difficulty and transported to the recovery room in satisfactory condition.  The patient tolerated the procedure well.  No complications.  Assistant, Lacie Draft, [place], was used throughout the case for patient positioning, intermittent suction, closure, etc.  BLOOD LOSS:  25 mL   PUS D: 01/30/2022 1:08:50 pm T: 01/30/2022 1:36:00 pm  JOB: [id number]/ [id number]

## 2022-01-30 NOTE — Evaluation (Signed)
Occupational Therapy Evaluation Patient Details Name: Kristen Escobar MRN: [id number] DOB: 04-28-1945 Today's Date: 01/30/2022   History of Present Illness 76 yo F s/p Microdiscetomy hemilaminectomy decompression Lumbar Five-Sacral One PMH includes: THA, B TKA, CA.   Clinical Impression   Patient admitted for the procedure above.  PTA she lives with her spouse, who is able to assist as needed.  Patient is very close to her baseline needing min verbal cues for ADL completion, and able to walk the hall and ascend 8 stairs without assist.  Precautions reviewed, and verbalized understanding.  No further needs in the acute setting with follow up recommended as prescribed by MD.      Recommendations for follow up therapy are one component of a multi-disciplinary discharge planning process, led by the attending physician.  Recommendations may be updated based on patient status, additional functional criteria and insurance authorization.   Follow Up Recommendations  No OT follow up    Assistance Recommended at Discharge Set up Supervision/Assistance  Patient can return home with the following Assist for transportation    Functional Status Assessment  Patient has not had a recent decline in their functional status  Equipment Recommendations  None recommended by OT    Recommendations for Other Services       Precautions / Restrictions Precautions Precautions: Back Precaution Booklet Issued: Yes (comment) Restrictions Weight Bearing Restrictions: No      Mobility Bed Mobility Overal bed mobility: Modified Independent                  Transfers Overall transfer level: Independent                        Balance Overall balance assessment: Mild deficits observed, not formally tested                                         ADL either performed or assessed with clinical judgement   ADL Overall ADL's : At baseline                                              Vision Patient Visual Report: No change from baseline       Perception Perception Perception: Within Functional Limits   Praxis Praxis Praxis: Intact    Pertinent Vitals/Pain Pain Assessment Pain Assessment: No/denies pain     Hand Dominance Right   Extremity/Trunk Assessment Upper Extremity Assessment Upper Extremity Assessment: Overall WFL for tasks assessed   Lower Extremity Assessment Lower Extremity Assessment: Overall WFL for tasks assessed   Cervical / Trunk Assessment Cervical / Trunk Assessment: Back Surgery   Communication Communication Communication: No difficulties   Cognition Arousal/Alertness: Awake/alert Behavior During Therapy: WFL for tasks assessed/performed Overall Cognitive Status: Within Functional Limits for tasks assessed                                       General Comments   VSS on RA    Exercises     Shoulder Instructions      Home Living Family/patient expects to be discharged to:: Private residence Living Arrangements: Spouse/significant other Available Help at Discharge: Family;Available 24 hours/day  Type of Home: House Home Access: Stairs to enter [employer] of Steps: 4 Entrance Stairs-Rails: Right Home Layout: Multi-level;Able to live on main level with bedroom/bathroom     Bathroom Shower/Tub: Tub only;Walk-in shower   Bathroom Toilet: Standard Bathroom Accessibility: Yes How Accessible: Accessible via walker Home Equipment: [place] (2 wheels);Cane - single point;Adaptive equipment Adaptive Equipment: Reacher;Sock aid;Long-handled shoe horn;Long-handled sponge        Prior Functioning/Environment Prior Level of Function : Independent/Modified Independent                        OT Problem List: Decreased activity tolerance      OT Treatment/Interventions:      OT Goals(Current goals can be found in the care plan section) Acute Rehab  OT Goals Patient Stated Goal: Return home OT Goal Formulation: With patient Time For Goal Achievement: 02/03/22 Potential to Achieve Goals: Good  OT Frequency:      Co-evaluation              AM-PAC OT "6 Clicks" Daily Activity     Outcome Measure Help from another person eating meals?: None Help from another person taking care of personal grooming?: None Help from another person toileting, which includes using toliet, bedpan, or urinal?: None Help from another person bathing (including washing, rinsing, drying)?: None Help from another person to put on and taking off regular upper body clothing?: None Help from another person to put on and taking off regular lower body clothing?: None 6 Click Score: 24   End of Session Nurse Communication: Mobility status  Activity Tolerance: Patient tolerated treatment well Patient left: in chair;with call bell/phone within reach;with family/visitor present  OT Visit Diagnosis: Unsteadiness on feet (R26.81);Muscle weakness (generalized) (M62.81)                Time: 7026-3785 OT Time Calculation (min): 21 min Charges:  OT General Charges $OT Visit: 1 Visit OT Evaluation $OT Eval Moderate Complexity: 1 Mod  01/30/2022  RP, OTR/L  Acute Rehabilitation Services  Office:  [phone]   Metta Clines 01/30/2022, 5:31 PM

## 2022-01-30 NOTE — Transfer of Care (Signed)
Immediate Anesthesia Transfer of Care Note  Patient: Kristen Escobar  Procedure(s) Performed: Microdiscetomy hemilaminectomy decompression Lumbar Five-Sacral One  Patient Location: PACU  Anesthesia Type:General  Level of Consciousness: awake, alert  and oriented  Airway & Oxygen Therapy: Patient Spontanous Breathing and Patient connected to face mask oxygen  Post-op Assessment: Report given to RN, Post -op Vital signs reviewed and stable, Patient moving all extremities X 4 and Patient able to stick tongue midline  Post vital signs: Reviewed  Last Vitals:  Vitals Value Taken Time  BP 159/89 01/30/22 1310  Temp 36.5 C 01/30/22 1310  Pulse 69 01/30/22 1313  Resp 13 01/30/22 1313  SpO2 98 % 01/30/22 1313  Vitals shown include unvalidated device data.  Last Pain:  Vitals:   01/30/22 1310  TempSrc:   PainSc: 0-No pain      Patients Stated Pain Goal: 2 (25/36/64 4034)  Complications: No notable events documented.

## 2022-01-30 NOTE — Progress Notes (Signed)
Patient is alert and oriented x4, void, surgical site clean and dry no sign of infection. D/c instruction explain and copy given to the patient , all questions answered. Patient accidentally pull her iv out while infusing abx. About 50% infused. Patient does not want another iv inserted. Patient d/c home per order

## 2022-01-30 NOTE — Anesthesia Procedure Notes (Signed)
Procedure Name: Intubation Date/Time: 01/30/2022 11:17 AM  Performed by: Maude Leriche, CRNAPre-anesthesia Checklist: Patient identified, Emergency Drugs available, Suction available and Patient being monitored Patient Re-evaluated:Patient Re-evaluated prior to induction Oxygen Delivery Method: Circle system utilized Preoxygenation: Pre-oxygenation with 100% oxygen Induction Type: IV induction Ventilation: Mask ventilation without difficulty Laryngoscope Size: Miller and 2 Grade View: Grade I Tube type: Oral Tube size: 6.5 mm Number of attempts: 1 Placement Confirmation: ETT inserted through vocal cords under direct vision, positive ETCO2 and breath sounds checked- equal and bilateral Secured at: 20 cm Tube secured with: Tape Dental Injury: Teeth and Oropharynx as per pre-operative assessment

## 2022-01-31 ENCOUNTER — Encounter (HOSPITAL_COMMUNITY): Payer: Self-pay | Admitting: Specialist

## 2022-02-24 ENCOUNTER — Other Ambulatory Visit: Payer: Self-pay | Admitting: Family Medicine

## 2022-02-24 DIAGNOSIS — E039 Hypothyroidism, unspecified: Secondary | ICD-10-CM

## 2022-03-06 ENCOUNTER — Other Ambulatory Visit (HOSPITAL_BASED_OUTPATIENT_CLINIC_OR_DEPARTMENT_OTHER): Payer: Self-pay

## 2022-03-06 DIAGNOSIS — Z23 Encounter for immunization: Secondary | ICD-10-CM | POA: Diagnosis not present

## 2022-03-06 MED ORDER — COMIRNATY 30 MCG/0.3ML IM SUSY
PREFILLED_SYRINGE | INTRAMUSCULAR | 0 refills | Status: DC
  Filled 2022-03-06: qty 0.3, 1d supply, fill #0

## 2022-03-26 DIAGNOSIS — R1310 Dysphagia, unspecified: Secondary | ICD-10-CM | POA: Diagnosis not present

## 2022-03-26 DIAGNOSIS — Z8719 Personal history of other diseases of the digestive system: Secondary | ICD-10-CM | POA: Diagnosis not present

## 2022-03-26 DIAGNOSIS — I731 Thromboangiitis obliterans [Buerger's disease]: Secondary | ICD-10-CM | POA: Diagnosis not present

## 2022-03-26 DIAGNOSIS — K219 Gastro-esophageal reflux disease without esophagitis: Secondary | ICD-10-CM | POA: Diagnosis not present

## 2022-03-26 DIAGNOSIS — D649 Anemia, unspecified: Secondary | ICD-10-CM | POA: Diagnosis not present

## 2022-03-26 DIAGNOSIS — Z6831 Body mass index (BMI) 31.0-31.9, adult: Secondary | ICD-10-CM | POA: Diagnosis not present

## 2022-03-26 DIAGNOSIS — E039 Hypothyroidism, unspecified: Secondary | ICD-10-CM | POA: Diagnosis not present

## 2022-03-26 DIAGNOSIS — I10 Essential (primary) hypertension: Secondary | ICD-10-CM | POA: Diagnosis not present

## 2022-03-26 DIAGNOSIS — I739 Peripheral vascular disease, unspecified: Secondary | ICD-10-CM | POA: Diagnosis not present

## 2022-03-26 DIAGNOSIS — Z87891 Personal history of nicotine dependence: Secondary | ICD-10-CM | POA: Diagnosis not present

## 2022-03-26 DIAGNOSIS — Z86718 Personal history of other venous thrombosis and embolism: Secondary | ICD-10-CM | POA: Diagnosis not present

## 2022-03-26 DIAGNOSIS — C8330 Diffuse large B-cell lymphoma, unspecified site: Secondary | ICD-10-CM | POA: Diagnosis not present

## 2022-04-22 ENCOUNTER — Ambulatory Visit (INDEPENDENT_AMBULATORY_CARE_PROVIDER_SITE_OTHER): Payer: Medicare Other | Admitting: Family Medicine

## 2022-04-22 ENCOUNTER — Encounter: Payer: Self-pay | Admitting: Family Medicine

## 2022-04-22 VITALS — BP 148/62 | HR 72 | Temp 97.7°F | Ht 61.0 in | Wt 190.7 lb

## 2022-04-22 DIAGNOSIS — E039 Hypothyroidism, unspecified: Secondary | ICD-10-CM | POA: Diagnosis not present

## 2022-04-22 DIAGNOSIS — I10 Essential (primary) hypertension: Secondary | ICD-10-CM | POA: Diagnosis not present

## 2022-04-22 DIAGNOSIS — K219 Gastro-esophageal reflux disease without esophagitis: Secondary | ICD-10-CM | POA: Diagnosis not present

## 2022-04-22 MED ORDER — OMEPRAZOLE 20 MG PO CPDR: 20.0000 mg | DELAYED_RELEASE_CAPSULE | Freq: Every day | ORAL | 3 refills | Status: DC

## 2022-04-22 MED ORDER — AMLODIPINE BESYLATE 10 MG PO TABS: 10.0000 mg | ORAL_TABLET | Freq: Every day | ORAL | 3 refills | Status: DC

## 2022-04-22 MED ORDER — LEVOTHYROXINE SODIUM 75 MCG PO TABS: 75.0000 ug | ORAL_TABLET | Freq: Every day | ORAL | 3 refills | Status: DC

## 2022-04-22 NOTE — Patient Instructions (Signed)
Increase the Amlodipine to 10 mg daily (new prescription sent)  Set up 3 month follow up

## 2022-04-22 NOTE — Progress Notes (Signed)
Established Patient Office Visit  Subjective   Patient ID: Kristen Escobar, female    DOB: January 07, 1946  Age: 77 y.o. MRN: [id number]  Chief Complaint  Patient presents with   Hypertension    HPI   Kristen Escobar is seen for medical follow-up.  She has hypertension treated with losartan HCTZ and amlodipine.  She had amlodipine added recently.  She brings in a log of home readings and has had some systolic readings up in the 150s but several 130s.  Diastolics consistently well-controlled 50s to 52s.  Denies any headaches or dizziness.  She has mitral murmur and echo last spring showed only trivial mitral regurg.  She states she had poor compliance with diet over the past couple months and but her weight is actually unchanged from last visit  She has GERD treated with omeprazole 20 mg daily.  Requesting refills.  She has hypothyroidism and is on levothyroxine 75 mcg daily.  Last TSH was last February.  Compliant with therapy.  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   in remission 2 years now   had chemo    GERD (gastroesophageal reflux disease)    Heart murmur    07/03/21 echo: LVEF 60-65%, grade I DD, normal PASP, trivial MR, mild AI   History of radiation therapy 07/12/10,completed   right breast 60 Gy x30 fx   Hypertension    no meds currently   Hypothyroidism    Obesity    PE (pulmonary thromboembolism) ([place]) 01/2017   in setting of chemotherapy for lymphoma   Peripheral vascular disease ([place]) 1995   PT DEVELOPED CIRCULATION PROBLEMS IN BOTH HANDS AND GANGRENE OF BOTH INDEX FINGERS--REQUIRING AMPUTATION OF THE INDEX FINGERS AND VASCULAR SURGERY.  PT TOLD HER PROBLEMS RELATED TO SMOKING.   NO OTHER PROBLEMS SINCE.   Personal history of radiation therapy  2012   Right Breast Cancer   Pneumonia    Thyroid mass    Vocal cord paralysis    left   Past Surgical History:  Procedure Laterality Date   AMPUTATION     partial amputation of both index fingers   BREAST LUMPECTOMY Right 04/01/2010   BREAST SURGERY  2011   lumpectomy with node sampling- RIGHT   CATARACT EXTRACTION, BILATERAL  approx 2017   COLONOSCOPY     ESOPHAGOGASTRODUODENOSCOPY     EXCISION MASS NECK Left 12/26/2016   Procedure: EXCISION MASS NECK;  Surgeon: Izora Gala, MD;  Location: [facility];  Service: ENT;  Laterality: Left;  open excision of thyroid mass left side with frozen section   HAND SURGERY Bilateral 1995   Amputaed pointer fingers bilaterally   IR FLUORO GUIDE PORT INSERTION RIGHT  01/14/2017   IR GASTROSTOMY TUBE MOD SED  02/19/2017   IR GASTROSTOMY TUBE REMOVAL  12/07/2018   IR PATIENT EVAL TECH 0-60 MINS  01/14/2019   IR REMOVAL TUN ACCESS W/ PORT W/O FL MOD SED  11/30/2017   IR REPLACE G-TUBE SIMPLE WO FLUORO  08/03/2017   IR REPLACE G-TUBE SIMPLE WO FLUORO  06/10/2018   IR REPLC GASTRO/COLONIC TUBE PERCUT W/FLUORO  02/25/2017   IR REPLC GASTRO/COLONIC TUBE PERCUT W/FLUORO  03/09/2017   IR US GUIDE BX ASP/DRAIN  12/07/2018   IR  US GUIDE VASC ACCESS RIGHT  01/14/2017   JOINT REPLACEMENT     LAPAROSCOPIC GASTRIC RESECTION N/A 09/09/2019   Procedure: LAPAROSCOPIC CLOSURE OF GASTRO-CUTANEOUS FISTULA, EXCISION OF FISTUAL TRACK;  Surgeon: Alphonsa Overall, MD;  Location: [facility];  Service: General;  Laterality: N/A;   LUMBAR LAMINECTOMY/DECOMPRESSION MICRODISCECTOMY N/A 01/30/2022   Procedure: Microdiscetomy hemilaminectomy decompression Lumbar Five-Sacral One;  Surgeon: Susa Day, MD;  Location: [facility];  Service: Orthopedics;  Laterality: N/A;  90 mins 3 C-Bed   TONSILLECTOMY     TOTAL HIP ARTHROPLASTY  12/16/2011   right hip   TOTAL HIP ARTHROPLASTY  01/20/2012   Procedure: TOTAL HIP ARTHROPLASTY ANTERIOR APPROACH;  Surgeon: Mauri Pole, MD;  Location:  [facility];  Service: Orthopedics;  Laterality: Left;   TOTAL KNEE ARTHROPLASTY Right 05/31/2018   Procedure: TOTAL KNEE ARTHROPLASTY;  Surgeon: Gaynelle Arabian, MD;  Location: [facility];  Service: Orthopedics;  Laterality: Right;  Adductor Block   TUBAL LIGATION     VASCULAR SURGERY     both hands    reports that she quit smoking about 29 years ago. Her smoking use included cigarettes. She has a 20.00 pack-year smoking history. She has never used smokeless tobacco. She reports that she does not currently use alcohol. She reports that she does not use drugs. family history includes Cancer in her mother and sister; Coronary artery disease in her brother, brother, and father; Diabetes in her father and sister; Hearing loss in her mother. No Known Allergies  Review of Systems  Constitutional:  Negative for malaise/fatigue.  Eyes:  Negative for blurred vision.  Respiratory:  Negative for shortness of breath.   Cardiovascular:  Negative for chest pain.  Neurological:  Negative for dizziness, weakness and headaches.      Objective:     BP (!) 148/62 (BP Location: Left Arm, Cuff Size: Large)   Pulse 72   Temp 97.7 F (36.5 C) (Oral)   Ht '5\' 1"'$  (1.549 m)   Wt 190 lb 11.2 oz (86.5 kg)   SpO2 96%   BMI 36.03 kg/m  BP Readings from Last 3 Encounters:  04/22/22 (!) 148/62  01/30/22 (!) 143/58  01/29/22 (!) 154/60   Wt Readings from Last 3 Encounters:  04/22/22 190 lb 11.2 oz (86.5 kg)  01/30/22 187 lb (84.8 kg)  01/29/22 190 lb 1.6 oz (86.2 kg)      Physical Exam Vitals reviewed.  Constitutional:      Appearance: She is well-developed.  Eyes:     Pupils: Pupils are equal, round, and reactive to light.  Neck:     Thyroid: No thyromegaly.     Vascular: No JVD.  Cardiovascular:     Rate and Rhythm: Normal rate and regular rhythm.     Heart sounds:     No gallop.  Pulmonary:     Effort: Pulmonary effort is normal. No respiratory distress.     Breath sounds: Normal breath sounds. No  wheezing or rales.  Musculoskeletal:     Cervical back: Neck supple.     Right lower leg: No edema.     Left lower leg: No edema.  Neurological:     Mental Status: She is alert.      No results found for any visits on 04/22/22.  Last CBC Lab Results  Component Value Date   WBC 7.7 01/22/2022   HGB 12.8 01/22/2022   HCT 38.1 01/22/2022   MCV 84.3 01/22/2022   MCH 28.3 01/22/2022   RDW 15.0  01/22/2022   PLT 198 23/30/0762   Last metabolic panel Lab Results  Component Value Date   GLUCOSE 97 01/22/2022   NA 132 (L) 01/22/2022   K 4.9 01/22/2022   CL 98 01/22/2022   CO2 24 01/22/2022   BUN 27 (H) 01/22/2022   CREATININE 1.14 (H) 01/22/2022   GFRNONAA 50 (L) 01/22/2022   CALCIUM 9.4 01/22/2022   PHOS 3.5 02/20/2017   PROT 7.4 05/08/2021   ALBUMIN 3.7 05/08/2021   BILITOT 0.4 05/08/2021   ALKPHOS 69 05/08/2021   AST 17 05/08/2021   ALT 9 05/08/2021   ANIONGAP 10 01/22/2022   Last lipids Lab Results  Component Value Date   CHOL 152 03/25/2016   HDL 49.80 03/25/2016   LDLCALC 78 03/25/2016   LDLDIRECT 146.4 06/26/2006   TRIG 108 02/18/2017   CHOLHDL 3 03/25/2016   Last thyroid functions Lab Results  Component Value Date   TSH 1.88 06/18/2021      The ASCVD Risk score (Arnett DK, et al., 2019) failed to calculate for the following reasons:   Cannot find a previous HDL lab   Cannot find a previous total cholesterol lab    Assessment & Plan:   Problem List Items Addressed This Visit       Unprioritized   GERD (gastroesophageal reflux disease)   Relevant Medications   omeprazole (PRILOSEC) 20 MG capsule   Hypothyroidism   Relevant Medications   levothyroxine (SYNTHROID) 75 MCG tablet   Essential hypertension - Primary   Relevant Medications   amLODipine (NORVASC) 10 MG tablet  Hypertension still suboptimally controlled.  Repeat blood pressure seated left arm large cuff 148/62.  She has isolated systolic hypertension.  -Cautiously increase  amlodipine to 10 mg daily and watch for peripheral edema.  Continue her losartan HCTZ -We also strong encouraged her to lose some weight -Continue home monitoring of blood pressure -Set up 52-monthfollow-up -Consider switch to more potent ARB and possibly switching to chlorthalidone at follow-up if still not to goal  -Refill omeprazole for 1 year -Refilled levothyroxine.  Plan to check TSH at 333-monthollow-up  Return in about 3 months (around 07/22/2022).    BrCarolann LittlerMD

## 2022-04-25 DIAGNOSIS — H43813 Vitreous degeneration, bilateral: Secondary | ICD-10-CM | POA: Diagnosis not present

## 2022-04-25 DIAGNOSIS — H52203 Unspecified astigmatism, bilateral: Secondary | ICD-10-CM | POA: Diagnosis not present

## 2022-05-08 ENCOUNTER — Inpatient Hospital Stay: Payer: Medicare Other

## 2022-05-08 ENCOUNTER — Other Ambulatory Visit: Payer: Self-pay

## 2022-05-08 ENCOUNTER — Inpatient Hospital Stay: Payer: Medicare Other | Attending: Hematology and Oncology | Admitting: Hematology and Oncology

## 2022-05-08 VITALS — BP 129/83 | HR 74 | Temp 97.2°F | Resp 16 | Wt 189.0 lb

## 2022-05-08 DIAGNOSIS — Z79811 Long term (current) use of aromatase inhibitors: Secondary | ICD-10-CM | POA: Insufficient documentation

## 2022-05-08 DIAGNOSIS — Z170 Estrogen receptor positive status [ER+]: Secondary | ICD-10-CM | POA: Insufficient documentation

## 2022-05-08 DIAGNOSIS — C8330 Diffuse large B-cell lymphoma, unspecified site: Secondary | ICD-10-CM

## 2022-05-08 DIAGNOSIS — Z79899 Other long term (current) drug therapy: Secondary | ICD-10-CM | POA: Insufficient documentation

## 2022-05-08 DIAGNOSIS — C50411 Malignant neoplasm of upper-outer quadrant of right female breast: Secondary | ICD-10-CM | POA: Diagnosis not present

## 2022-05-08 DIAGNOSIS — C8339 Diffuse large B-cell lymphoma, extranodal and solid organ sites: Secondary | ICD-10-CM | POA: Diagnosis not present

## 2022-05-08 LAB — CMP (CANCER CENTER ONLY)
ALT: 9 U/L (ref 0–44)
AST: 17 U/L (ref 15–41)
Albumin: 3.6 g/dL (ref 3.5–5.0)
Alkaline Phosphatase: 68 U/L (ref 38–126)
Anion gap: 6 (ref 5–15)
BUN: 12 mg/dL (ref 8–23)
CO2: 28 mmol/L (ref 22–32)
Calcium: 9.2 mg/dL (ref 8.9–10.3)
Chloride: 105 mmol/L (ref 98–111)
Creatinine: 1.09 mg/dL — ABNORMAL HIGH (ref 0.44–1.00)
GFR, Estimated: 53 mL/min — ABNORMAL LOW (ref >60)
Glucose, Bld: 97 mg/dL (ref 70–99)
Potassium: 3.8 mmol/L (ref 3.5–5.1)
Sodium: 139 mmol/L (ref 135–145)
Total Bilirubin: 0.5 mg/dL (ref 0.3–1.2)
Total Protein: 6.7 g/dL (ref 6.5–8.1)

## 2022-05-08 LAB — CBC WITH DIFFERENTIAL (CANCER CENTER ONLY)
Abs Immature Granulocytes: 0.01 10*3/uL (ref 0.00–0.07)
Basophils Absolute: 0.1 10*3/uL (ref 0.0–0.1)
Basophils Relative: 1 %
Eosinophils Absolute: 0.6 10*3/uL — ABNORMAL HIGH (ref 0.0–0.5)
Eosinophils Relative: 8 %
HCT: 37.4 % (ref 36.0–46.0)
Hemoglobin: 12.5 g/dL (ref 12.0–15.0)
Immature Granulocytes: 0 %
Lymphocytes Relative: 28 %
Lymphs Abs: 2.1 10*3/uL (ref 0.7–4.0)
MCH: 28 pg (ref 26.0–34.0)
MCHC: 33.4 g/dL (ref 30.0–36.0)
MCV: 83.7 fL (ref 80.0–100.0)
Monocytes Absolute: 0.6 10*3/uL (ref 0.1–1.0)
Monocytes Relative: 8 %
Neutro Abs: 4 10*3/uL (ref 1.7–7.7)
Neutrophils Relative %: 55 %
Platelet Count: 251 10*3/uL (ref 150–400)
RBC: 4.47 MIL/uL (ref 3.87–5.11)
RDW: 13.8 % (ref 11.5–15.5)
WBC Count: 7.5 10*3/uL (ref 4.0–10.5)
nRBC: 0 % (ref 0.0–0.2)

## 2022-05-08 LAB — LACTATE DEHYDROGENASE: LDH: 152 U/L (ref 98–192)

## 2022-05-08 NOTE — Assessment & Plan Note (Addendum)
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 positive for LCA, CD20, CD79a PAX-5, BCL-6 and weak cytoplasmic kappa associated with patchy weak positivity for BCL-2 and CD10, Ki-67 40%   Stage IIb (bilateral cervical, esophageal, mediastinal, supraclavicular nodes)   Bone marrow biopsy negative for diffuse large B cell lymphoma but suggestive of small low-grade B cell population possibly SLL/CLL   Revised IPI score: 1, good prognosis, redictated for years PFS 80%, predicted overall survival 79% ------------------------------------------------------------------------ Prior treatment: R CHOP X 5 cycles completed 04/10/2017   TE Fistula: Repaired    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  Completed November 2019. 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 PET CT scan 11/11/2018: No evidence of lymphoma on the scan.  Hypermetabolic activity around the PEG tube site. CT chest with contrast November 16, 2019: No acute cardiopulmonary abnormalities no residual lymphoma.  Emphysema, coronary calcifications, gallstones   Scans to be done on an as-needed basis. Return to clinic on an as-needed basis

## 2022-05-08 NOTE — Progress Notes (Signed)
Patient Care Team: Eulas Post, MD as PCP - General (Family Medicine) Marcy Panning, MD as Consulting Physician (Internal Medicine) Thea Silversmith, MD as Consulting Physician (Radiation Oncology) Nicholas Lose, MD as Consulting Physician (Hematology and Oncology) Gaynelle Arabian, MD as Consulting Physician (Orthopedic Surgery) Danella Sensing, MD as Consulting Physician (Dermatology) Melissa Noon, [place] as Consulting Physician (Optometry) Ernestine Conrad, MD as Consulting Physician (Otolaryngology)  DIAGNOSIS:  Encounter Diagnosis  Name Primary?   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 Radiation recall related to tamoxifen   11/25/2010 -  Anti-estrogen oral therapy   Aromasin 25 mg daily.myalgias and arthralgias stop November 2012 went back to tamoxifen 20 mg daily   01/06/2017 Pathology Results   Bone marrow biopsy: Slightly hypercellular bone marrow for age with granulocytic hyperplasia, numerous small lymphoid aggregates present. 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 with above-mentioned history of breast cancer and large B cell lymphoma having undergone a right lumpectomy, chemotherapy, and radiation therapy. She presents to the clinic today for follow-up. She reports that she has been doing good. She says she had back surgery but she back walking [facility]   ALLERGIES:  has No Known Allergies.  MEDICATIONS:  Current Outpatient Medications  Medication Sig Dispense Refill   amLODipine (NORVASC) 10 MG tablet Take 1 tablet (10 mg total) by mouth daily. 90 tablet 3   levothyroxine (SYNTHROID) 75 MCG tablet Take 1 tablet (75 mcg total) by mouth daily. 90 tablet 3   losartan-hydrochlorothiazide (HYZAAR) 50-12.5 MG tablet Take 1 tablet by mouth daily. 90 tablet 3   Multiple Vitamins-Minerals (ADULT GUMMY PO) Take 2 tablets by mouth daily. Woman's Gummy     omeprazole (PRILOSEC) 20 MG capsule Take 1 capsule (20 mg total) by mouth daily. 90 capsule 3   pramipexole (MIRAPEX) 0.5 MG tablet TAKE 1 TABLET BY MOUTH AT BEDTIME. 90 tablet 0   No current facility-administered medications for this visit.    PHYSICAL EXAMINATION: ECOG PERFORMANCE STATUS: 1 - Symptomatic but completely ambulatory  Vitals:   05/08/22 1127  BP: 129/83  Pulse: 74  Resp: 16  Temp: (!) 97.2 F (36.2 C)  SpO2: 99%   Filed Weights   05/08/22 1127  Weight: 189 lb (  85.7 kg)    LABORATORY DATA:  I have reviewed the data as listed    Latest Ref Rng & Units 05/08/2022   10:43 AM 01/22/2022    9:19 AM 05/08/2021   10:45 AM  CMP  Glucose 70 - 99 mg/dL 97  97  93   BUN 8 - 23  mg/dL _0 Creatinine 0.44 - 1.00 mg/dL 1.09  1.14  1.14   Sodium 135 - 145 mmol/L 139  132  137   Potassium 3.5 - 5.1 mmol/L 3.8  4.9  4.3   Chloride 98 - 111 mmol/L 105  98  103   CO2 22 - 32 mmol/L _1 Calcium 8.9 - 10.3 mg/dL 9.2  9.4  9.1   Total Protein 6.5 - 8.1 g/dL 6.7   7.4   Total Bilirubin 0.3 - 1.2 mg/dL 0.5   0.4   Alkaline Phos 38 - 126 U/L 68   69   AST 15 - 41 U/L 17   17   ALT 0 - 44 U/L 9   9     Lab Results  Component Value Date   WBC 7.5 05/08/2022   HGB 12.5 05/08/2022   HCT 37.4 05/08/2022   MCV 83.7 05/08/2022   PLT 251 05/08/2022   NEUTROABS 4.0 05/08/2022    ASSESSMENT & PLAN:  Diffuse large B cell lymphoma ([place]) 12/26/2016 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 positive for LCA, CD20, CD79a PAX-5, BCL-6 and weak cytoplasmic kappa associated with patchy weak positivity for BCL-2 and CD10, Ki-67 40%   Stage IIb (bilateral cervical, esophageal, mediastinal, supraclavicular nodes)   Bone marrow biopsy negative for diffuse large B cell lymphoma but suggestive of small low-grade B cell population possibly SLL/CLL   Revised IPI score: 1, good prognosis, redictated for years PFS 80%, predicted overall survival 79% ------------------------------------------------------------------------ Prior treatment: R CHOP X 5 cycles completed 04/10/2017   TE Fistula: Repaired    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  Completed November 2019. 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 PET CT scan 11/11/2018: No evidence of lymphoma on the scan.  Hypermetabolic activity around the PEG tube site. CT chest with contrast November 16, 2019: No acute cardiopulmonary abnormalities no residual lymphoma.  Emphysema, coronary calcifications, gallstones   Recent back surgery: Remarkable improvement  in her performance status since the back surgery.  Scans to be done on an as-needed basis. Return to clinic on an as-needed basis   No orders of the defined types were placed in this encounter.  The patient has a good understanding of the overall plan. she agrees with it. she will call with any problems that may develop before the next visit here. Total time spent: 30 mins including face to face time and time spent for planning, charting and co-ordination of care   Harriette Ohara, MD 05/08/22    I Gardiner Coins am acting as a Education administrator for [employer]  I have reviewed the above documentation for accuracy and completeness, and I agree with the above.

## 2022-05-12 ENCOUNTER — Telehealth: Payer: Self-pay | Admitting: Hematology and Oncology

## 2022-05-12 NOTE — Telephone Encounter (Signed)
Called patient per Dr.Gudena's 1/18 los. At this time, patient does not want to schedule a yearly FU due to being under the impression that she did not need another FU since she has hit 5 years. No appointments per made and scheduler encouraged patient to call the office if she would like to schedule a FU.

## 2022-05-28 ENCOUNTER — Telehealth: Payer: Self-pay | Admitting: Gastroenterology

## 2022-05-28 NOTE — Telephone Encounter (Signed)
Inbound call from patients husband Herbie Baltimore stating that patient has been having issues with changes in her bowels. Patient will have constipation and then have diarrhea. Patient often has times where she will not make it to the bathroom and is scared to leave the house. Herbie Baltimore stated that he wanted to schedule her for a colonoscopy, and I advised that she was not due until October. Herbie Baltimore is requesting a call back to discuss further and to see what the next steps are for patient. Please advise.

## 2022-05-28 NOTE — Telephone Encounter (Signed)
Unfortunately, this patient has not been seen since 11/2016 and that was for an esophageal mass. Pt will need an appt for evaluation. Thank you

## 2022-05-28 NOTE — Telephone Encounter (Signed)
Patient has been scheduled for OV on 2/9 at 9:00 with Carl Best to discuss new symptoms.

## 2022-05-28 NOTE — Telephone Encounter (Signed)
Noted, thanks!

## 2022-05-30 ENCOUNTER — Ambulatory Visit (INDEPENDENT_AMBULATORY_CARE_PROVIDER_SITE_OTHER): Payer: Medicare Other | Admitting: Nurse Practitioner

## 2022-05-30 ENCOUNTER — Encounter: Payer: Self-pay | Admitting: Nurse Practitioner

## 2022-05-30 VITALS — BP 144/50 | HR 88 | Ht 60.5 in | Wt 188.1 lb

## 2022-05-30 DIAGNOSIS — R197 Diarrhea, unspecified: Secondary | ICD-10-CM

## 2022-05-30 DIAGNOSIS — Z86010 Personal history of colonic polyps: Secondary | ICD-10-CM | POA: Diagnosis not present

## 2022-05-30 DIAGNOSIS — K5900 Constipation, unspecified: Secondary | ICD-10-CM | POA: Diagnosis not present

## 2022-05-30 NOTE — Progress Notes (Signed)
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 She presents today for further evaluation regarding a change in bowel pattern. She is known by Dr. Havery Moros. She previously had chronic constipation consisting of no BM for 3 days then passed a large amount of pellet like stool. However, for the past 3 weeks her bowel pattern has changed. She describe having constipation, no BM for a few days or passes a few hard bits of stool then passes urgent nonbloody diarrhea BMs for a few days then the cycle repeats. She is afraid to leave the house in fear of developing urgent diarrhea and not making it to the bathroom in time. She is not going to her water exercise classes as she is concerned she will pass diarrhea while in the swimming pool. She takes one Imodium tab after the first diarrhea BM then often takes a second Imodium then no BM for a day. No abdominal or rectal pain. No recent antibiotics. She started taking Amlodipine 3 months ago, no other new medications since then.   She underwent a colonoscopy 10/26/20217 and one 6-5m tubular adenomatous polyp was removed from the proximal transverse colon, diverticulosis to the left colon and a hypertrophied anal papilla was noted. She was advised to repeat a colonoscopy in 7 years. She wishes to schedule a colonoscopy at this time and does not wish to wait until her recall date 01/2023.   She was diagnosed with large B cell lymphoma 12/2016, CT scan showed a mass in the left thyroid/esophagus region. She  underwent an EGD by Dr. AHavery Moros8/22/2018 which identified a large mass in the proximal esophagus, path report was negative for malignancy. A thyroid biopsy was then performed which confirmed lymphoma. She received on round of chemo then developed left vocal chord paralysis and dysphagia secondary to a tracheoesophageal fistula. She underwent repair of the tracheoesophageal fistula [id number] by a Escobar-op fistula formation which required incisional drainage and placement of a wound vac for a few weeks. A G-tube was placed as she required tube feedings. Her tracheoesophageal fistula eventually resolved and the G tube was removed 11/2018. Since then, she has recurrent dysphagia with solid food s/p transnasal esophageal endoscopy with dilatation about twice yearly by Dr. WJoya Gaskinsat [employer] last performed on 03/26/2022.   She stated that is she ever needed to  be intubated, she was advised by Dr. WJoya Gaskinsto inform the anesthesiologist to use one size smaller endotracheal tube.       Latest Ref Rng & Units 05/08/2022   10:43 AM 01/22/2022    9:19 AM 06/18/2021    9:05 AM  CBC  WBC 4.0 - 10.5 K/uL 7.5  7.7  7.1   Hemoglobin 12.0 - 15.0 g/dL 12.5  12.8  12.9   Hematocrit 36.0 - 46.0 % 37.4  38.1  39.7   Platelets 150 - 400 K/uL 251  198  229.0        Latest Ref Rng & Units 05/08/2022   10:43 AM 01/22/2022    9:19 AM 05/08/2021   10:45 AM  CMP  Glucose  70 - 99 mg/dL 97  97  93   BUN 8 - 23 mg/dL 12  27  22   $ Creatinine 0.44 - 1.00 mg/dL 1.09  1.14  1.14   Sodium 135 - 145 mmol/L 139  132  137   Potassium 3.5 - 5.1 mmol/L 3.8  4.9  4.3   Chloride 98 - 111 mmol/L 105  98  103   CO2 22 - 32 mmol/L 28  24  29   $ Calcium 8.9 - 10.3 mg/dL 9.2  9.4  9.1   Total Protein 6.5 - 8.1 g/dL 6.7   7.4   Total Bilirubin 0.3 - 1.2 mg/dL 0.5   0.4   Alkaline Phos 38 - 126 U/L 68   69   AST 15 - 41 U/L 17   17   ALT 0 - 44 U/L 9   9     ECHO 07/03/2021: LV EF 60 -65%. Trivial  MR.  EGD 12/10/2016:  Esophagus, biopsy, mass 15-13 from the incisor - INFLAMED GRANULATION TISSUE WITH NECROINFLAMMATORY DEBRIS AND INFLAMED SQUAMOUS MUCOSA. - NO MALIGNANCY IDENTIFIED.  Colonoscopy 02/14/2016:  Path report: Tubular adenoma.   Colonoscopy 06/16/2005: No polyps  Past Medical History:  Diagnosis Date   Acute respiratory failure with hypoxia ([place]) 02/16/2017   Arthritis    Cancer ([place]) 03/2009   breast- rt   Complication of anesthesia    Hard to void after knee replacement 05-2018   Diffuse large B cell lymphoma ([place]) dx'd 02/17/17   in remission 2 years now   had chemo    GERD (gastroesophageal reflux disease)    Heart murmur    07/03/21 echo: LVEF 60-65%, grade I DD, normal PASP, trivial MR, mild AI   History of radiation therapy 07/12/10,completed   right breast 60 Gy x30 fx   Hypertension    no meds currently   Hypothyroidism    Obesity    PE (pulmonary thromboembolism) ([place]) 01/2017   in setting of chemotherapy for lymphoma   Peripheral vascular disease ([place]) 1995   PT DEVELOPED CIRCULATION PROBLEMS IN BOTH HANDS AND GANGRENE OF BOTH INDEX FINGERS--REQUIRING AMPUTATION OF THE INDEX FINGERS AND VASCULAR SURGERY.  PT TOLD HER PROBLEMS RELATED TO SMOKING.   NO OTHER PROBLEMS SINCE.   Personal history of radiation therapy 2012   Right Breast Cancer   Pneumonia    Thyroid mass    Vocal cord paralysis    left   Past Surgical History:  Procedure Laterality Date   AMPUTATION     partial amputation of both index fingers   BREAST LUMPECTOMY Right 04/01/2010   BREAST SURGERY  2011   lumpectomy with node sampling- RIGHT   CATARACT EXTRACTION, BILATERAL  approx 2017   COLONOSCOPY     ESOPHAGOGASTRODUODENOSCOPY     EXCISION MASS NECK Left 12/26/2016   Procedure: EXCISION MASS NECK;  Surgeon: Izora Gala, MD;  Location: [facility];  Service: ENT;  Laterality: Left;  open excision of thyroid mass left side with frozen section   HAND SURGERY Bilateral 1995    Amputaed pointer fingers bilaterally   IR FLUORO GUIDE PORT INSERTION RIGHT  01/14/2017   IR GASTROSTOMY TUBE MOD SED  02/19/2017   IR GASTROSTOMY TUBE REMOVAL  12/07/2018   IR PATIENT EVAL TECH 0-60 MINS  01/14/2019   IR REMOVAL TUN ACCESS W/ PORT W/O FL MOD SED  11/30/2017   IR REPLACE G-TUBE SIMPLE WO FLUORO  08/03/2017   IR REPLACE G-TUBE SIMPLE WO FLUORO  06/10/2018   IR [place] GASTRO/COLONIC TUBE PERCUT W/FLUORO  02/25/2017   IR REPLC GASTRO/COLONIC TUBE PERCUT W/FLUORO  03/09/2017   IR US GUIDE BX ASP/DRAIN  12/07/2018   IR US GUIDE VASC ACCESS RIGHT  01/14/2017   JOINT REPLACEMENT     LAPAROSCOPIC GASTRIC RESECTION N/A 09/09/2019   Procedure: LAPAROSCOPIC CLOSURE OF GASTRO-CUTANEOUS FISTULA, EXCISION OF FISTUAL TRACK;  Surgeon: Alphonsa Overall, MD;  Location: [facility];  Service: General;  Laterality: N/A;   LUMBAR LAMINECTOMY/DECOMPRESSION MICRODISCECTOMY N/A 01/30/2022   Procedure: Microdiscetomy hemilaminectomy decompression Lumbar Five-Sacral One;  Surgeon: Susa Day, MD;  Location: [facility];  Service: Orthopedics;  Laterality: N/A;  90 mins 3 C-Bed   TONSILLECTOMY     TOTAL HIP ARTHROPLASTY  12/16/2011   right hip   TOTAL HIP ARTHROPLASTY  01/20/2012   Procedure: TOTAL HIP ARTHROPLASTY ANTERIOR APPROACH;  Surgeon: Mauri Pole, MD;  Location: [facility];  Service: Orthopedics;  Laterality: Left;   TOTAL KNEE ARTHROPLASTY Right 05/31/2018   Procedure: TOTAL KNEE ARTHROPLASTY;  Surgeon: Gaynelle Arabian, MD;  Location: [facility];  Service: Orthopedics;  Laterality: Right;  Adductor Block   TUBAL LIGATION     VASCULAR SURGERY     both hands   Social History: She is married. She has one son and two daughters. She quit smoking cigarettes 29 years ago. No alcohol use. No drug use.   Family History: family history includes Cancer in her mother and sister; Coronary artery disease in her brother, brother, and father; Diabetes in her father and sister; Hearing loss in her mother.  No Known  Allergies    Outpatient Encounter Medications as of 05/30/2022  Medication Sig   amLODipine (NORVASC) 10 MG tablet Take 1 tablet (10 mg total) by mouth daily.   levothyroxine (SYNTHROID) 75 MCG tablet Take 1 tablet (75 mcg total) by mouth daily.   losartan-hydrochlorothiazide (HYZAAR) 50-12.5 MG tablet Take 1 tablet by mouth daily.   Multiple Vitamins-Minerals (ADULT GUMMY PO) Take 2 tablets by mouth daily. Woman's Gummy   omeprazole (PRILOSEC) 20 MG capsule Take 1 capsule (20 mg total) by mouth daily.   pramipexole (MIRAPEX) 0.5 MG tablet TAKE 1 TABLET BY MOUTH AT BEDTIME.   No facility-administered encounter medications on file as of 05/30/2022.    REVIEW OF SYSTEMS:  Gen: Denies fever, sweats or chills. No weight loss.  CV: Denies chest pain, palpitations or edema. Resp: Denies cough, shortness of breath of hemoptysis.  GI: Denies heartburn, dysphagia, stomach or lower abdominal pain. No diarrhea or constipation.  GU : Denies urinary burning, blood in urine, increased urinary frequency or incontinence. MS: Denies joint pain, muscles aches or weakness. Derm: Denies rash, itchiness, skin lesions or unhealing ulcers. Psych: Denies depression, anxiety, memory loss or confusion. Heme: Denies bruising, easy bleeding. Neuro:  Denies headaches, dizziness or paresthesias. Endo:  Denies any problems with DM, thyroid or adrenal function.  PHYSICAL EXAM: BP (!) 144/50 (BP Location: Left Arm, Patient Position: Sitting, Cuff Size: Large)   Pulse 88   Ht 5' 0.5" (1.537 m) Comment: height measured without shoes  Wt 188 lb 2 oz (85.3 kg)   BMI 36.14 kg/m   General: 77 year old female in no acute distress. Head: Normocephalic and atraumatic. Eyes:  Sclerae non-icteric, conjunctive pink. Ears: Normal auditory acuity. Mouth: Dentures in use. No ulcers or lesions.  Neck: Supple, no lymphadenopathy or thyromegaly.  Lungs: Clear bilaterally to auscultation without wheezes, crackles or  rhonchi. Heart: Regular rate and rhythm. Systolic murmur.  No rub or gallop appreciated.  Abdomen: Soft, nontender, nondistended. No masses. No hepatosplenomegaly. Normoactive bowel sounds x 4 quadrants.  Rectal: Deferred.  Musculoskeletal: Symmetrical with no gross deformities. Skin: Warm and dry. No rash or lesions on visible extremities. Extremities: No edema. Neurological: Alert oriented x 4, no focal deficits.  Psychological:  Alert and cooperative. Normal mood and affect.  ASSESSMENT AND PLAN:  50) 77 year old female with a change in bowel pattern, alternating constipation and diarrhea  -Benefiber 1 tablespoon daily  -Miralax 1/2 capful Q HS as tolerated, stop if diarrhea recurs  -Patient to contact office if symptoms persist or worsen   2) History of a tubular adenomatous polyp per colonoscopy 01/2016 -Colonoscopy benefits and risks discussed including risk with sedation, risk of bleeding, perforation and infection. I will consult with Dr. Havery Moros prior to scheduling a colonoscopy at [facility].  3) History of breast cancer treated with neoadjuvant antiestrogen therapy s/p right breast lumpectomy 03/2010 and radiation 05/2010  4) History of large B cell lymphoma initially diagnosed 12/2016, CT scan showing a mass in the left thyroid/esophagus region treated with chemo ([facility])  5) Dysphagia s/p tracheoesophageal fistula developed after first round of chemo s/p repair of tracheoesophageal fistula 0000000 complicated by a Escobar operative fistula which required I & D and placement of a wound vac and tube feeding. Recurrent dysphagia since then which requires transnasal esophageal endoscopy with dilatation about twice yearly by Dr. Joya Gaskins at [place]:  Kristen Post, MD

## 2022-05-30 NOTE — Patient Instructions (Addendum)
Benefiber- 1 tablespoon daily  Miralax- half of a capful mixed in any clear liquid of your choice at bedtime  Our office will contact you to schedule a colonoscopy.  Thank you for trusting me with your gastrointestinal care!   Carl Best, CRNP

## 2022-06-02 ENCOUNTER — Telehealth: Payer: Self-pay | Admitting: *Deleted

## 2022-06-02 NOTE — Progress Notes (Signed)
Remo Lipps, pls contact patient and schedule her for a colonoscopy with Dr. Havery Moros. Please let her know our anesthesia team reviewed her chart and she was cleared to have procedure at [facility]. THX.

## 2022-06-02 NOTE — Progress Notes (Signed)
Agree with assessment and plan as outlined.  Given her altered bowel habits I think reasonable to do colonoscopy now if she is anxious about that.  John can you please review her history and let us know what you think in regards to whether this case is okay for the [place] or if she needs to be done at the hospital?  Thank you

## 2022-06-02 NOTE — Telephone Encounter (Signed)
John thank you for the quick response, appreciate it.  Jaclyn Shaggy, [place], patient can proceed with procedure at the [facility]. Please see my comment on your note for details. Thanks

## 2022-06-02 NOTE — Telephone Encounter (Signed)
Noted, see office visit addendums. Patient will  be contacted to schedule a colonoscopy at [facility] with Dr. Havery Moros.

## 2022-06-02 NOTE — Telephone Encounter (Signed)
Dr. Havery Moros,  This pt is cleared for anesthetic care at [facility].  Thanks,  Osvaldo Angst

## 2022-06-03 ENCOUNTER — Other Ambulatory Visit: Payer: Self-pay

## 2022-06-03 ENCOUNTER — Telehealth: Payer: Self-pay

## 2022-06-03 DIAGNOSIS — R197 Diarrhea, unspecified: Secondary | ICD-10-CM

## 2022-06-03 DIAGNOSIS — Z86010 Personal history of colonic polyps: Secondary | ICD-10-CM

## 2022-06-03 DIAGNOSIS — K5900 Constipation, unspecified: Secondary | ICD-10-CM

## 2022-06-03 MED ORDER — SUTAB 1479-225-188 MG PO TABS: ORAL_TABLET | ORAL | 0 refills | Status: DC

## 2022-06-03 NOTE — Telephone Encounter (Signed)
Noralyn Pick, NP  Gillermina Hu, RN      Previous Messages  Routed Note  Author: Noralyn Pick, NP Service: Gastroenterology Author Type: Nurse Practitioner  Filed: 06/02/2022  1:25 PM Encounter Date: 05/30/2022 Status: Signed  Editor: Noralyn Pick, NP (Nurse Practitioner)  Remo Lipps, pls contact patient and schedule her for a colonoscopy with Dr. Havery Moros. Please let her know our anesthesia team reviewed her chart and she was cleared to have procedure at [facility]. THX.     Office Visit for Constipation 05/30/2022 Noralyn Pick, NP - [facility] Gastroenterology Diagnoses  Constipation, Unspecified Constipation Type (Primary) Diarrhea, unspecified type History of colonic polyps Orders Signed This Visit  None Orders Pended This Visit  None Progress Notes  Noralyn Pick, NP at 05/30/2022  9:00 AM  Status: Signed  Remo Lipps, pls contact patient and schedule her for a colonoscopy with Dr. Havery Moros. Please let her know our anesthesia team reviewed her chart and she was cleared to have procedure at [facility]. THX.    Yetta Flock, MD at 05/30/2022  9:00 AM  Status: Signed  Agree with assessment and plan as outlined.   Given her altered bowel habits I think reasonable to do colonoscopy now if she is anxious about that.   John can you please review her history and let us know what you think in regards to whether this case is okay for the [place] or if she needs to be done at the hospital?   Thank you

## 2022-06-03 NOTE — Telephone Encounter (Signed)
Pt made aware of Carl Best NP and Dr. Havery Moros recommendations. Pt was scheduled for a Colonoscopy on 06/17/2022 at 10:00 with Dr. Havery Moros in the [facility]. Pt made aware.  Prep sent to pt pharmacy. Pt made aware. Prep instructions sent to pt via my chart. Pt made aware Ambulatory referral to GI placed in EPIC. Pt made aware.  Pt verbalized understanding with all questions answered.

## 2022-06-03 NOTE — Telephone Encounter (Signed)
Noralyn Pick, NP  Gillermina Hu, RN      Previous Messages  Routed Note  Author: Noralyn Pick, NP Service: Gastroenterology Author Type: Nurse Practitioner  Filed: 06/02/2022  1:25 PM Encounter Date: 05/30/2022 Status: Signed  Editor: Noralyn Pick, NP (Nurse Practitioner)  Remo Lipps, pls contact patient and schedule her for a colonoscopy with Dr. Havery Moros. Please let her know our anesthesia team reviewed her chart and she was cleared to have procedure at [facility]. THX.     Office Visit for Constipation 05/30/2022 Noralyn Pick, NP - [facility] Gastroenterology Diagnoses  Constipation, Unspecified Constipation Type (Primary) Diarrhea, unspecified type History of colonic polyps Orders Signed This Visit  None Orders Pended This Visit  None Progress Notes  Noralyn Pick, NP at 05/30/2022  9:00 AM  Status: Signed  Remo Lipps, pls contact patient and schedule her for a colonoscopy with Dr. Havery Moros. Please let her know our anesthesia team reviewed her chart and she was cleared to have procedure at [facility]. THX.    Yetta Flock, MD at 05/30/2022  9:00 AM  Status: Signed  Agree with assessment and plan as outlined.   Given her altered bowel habits I think reasonable to do colonoscopy now if she is anxious about that.   John can you please review her history and let us know what you think in regards to whether this case is okay for the [place] or if she needs to be done at the hospital?   Thank you

## 2022-06-04 NOTE — Telephone Encounter (Signed)
Inbound call from patients husband requesting a call back from the nurse to discuss medication concerns they have. Please advise.

## 2022-06-04 NOTE — Telephone Encounter (Signed)
Pt husband Kristen Escobar  stated that Kristen Escobar has a hard time taking pills and was ordered the Sutab for her prep for her colonoscopy: Kristen Escobar stated that Vihana did the Miralax prep at last colonoscopy and it worked great for her.  Prep Instructions for the Miralax was sent to Pt Kristen Escobar verbalized understanding with all questions answered.

## 2022-06-09 ENCOUNTER — Telehealth: Payer: Self-pay | Admitting: *Deleted

## 2022-06-09 NOTE — Telephone Encounter (Signed)
Dr. Havery Moros,  This pt is cleared for anesthetic care at [facility].  Thanks,  Osvaldo Angst

## 2022-06-09 NOTE — Telephone Encounter (Signed)
Thanks for reviewing Kristen Escobar

## 2022-06-17 ENCOUNTER — Encounter: Payer: Self-pay | Admitting: Gastroenterology

## 2022-06-17 ENCOUNTER — Ambulatory Visit (AMBULATORY_SURGERY_CENTER): Payer: Medicare Other | Admitting: Gastroenterology

## 2022-06-17 VITALS — BP 136/69 | HR 65 | Temp 97.5°F | Resp 17 | Ht 60.5 in | Wt 188.0 lb

## 2022-06-17 DIAGNOSIS — Z09 Encounter for follow-up examination after completed treatment for conditions other than malignant neoplasm: Secondary | ICD-10-CM

## 2022-06-17 DIAGNOSIS — R194 Change in bowel habit: Secondary | ICD-10-CM

## 2022-06-17 DIAGNOSIS — Z860100 Personal history of colon polyps, unspecified: Secondary | ICD-10-CM

## 2022-06-17 DIAGNOSIS — D122 Benign neoplasm of ascending colon: Secondary | ICD-10-CM | POA: Diagnosis not present

## 2022-06-17 HISTORY — PX: COLONOSCOPY: SHX174

## 2022-06-17 MED ORDER — SODIUM CHLORIDE 0.9 % IV SOLN: 500.0000 mL | Freq: Once | INTRAVENOUS | Status: DC

## 2022-06-17 NOTE — Progress Notes (Signed)
History and Physical Interval Note: Patient seen on 05/30/22 - history of TA removed 01/2016 - now with change in bowel habits. Placed on fiber / miralax since the last visit. Colonoscopy to further evaluate. No interval changes since office visit otherwise. Bowels doing better on regimen since the visit.  She feels well today.  06/17/2022 10:06 AM  Kristen Escobar  has presented today for endoscopic procedure(s), with the diagnosis of  Encounter Diagnoses  Name Primary?   Change in bowel habits Yes   History of colon polyps   .  The various methods of evaluation and treatment have been discussed with the patient and/or family. After consideration of risks, benefits and other options for treatment, the patient has consented to  the endoscopic procedure(s).   [name] history has been reviewed, patient examined, no change in status, stable for surgery.  I have reviewed the patient's chart and labs.  Questions were answered to the patient's satisfaction.    Jolly Mango, MD [facility] Gastroenterology

## 2022-06-17 NOTE — Op Note (Signed)
[place] Patient Name: Kristen Escobar Procedure Date: 06/17/2022 10:07 AM MRN: [id number] Endoscopist: Remo Lipps P. Havery Moros , MD, [id number] Age: 77 Referring MD:  Date of Birth: 1945-10-31 Gender: Female Account #: [id number] Procedure:                Colonoscopy Indications:              Change in bowel habits, history of polyps - 01/2016                            - one adenoma. Improved with fiber supplement /                            Miralax since office visit Medicines:                Monitored Anesthesia Care Procedure:                Pre-Anesthesia Assessment:                           - Prior to the procedure, a History and Physical                            was performed, and patient medications and                            allergies were reviewed. [name] tolerance of                            previous anesthesia was also reviewed. The risks                            and benefits of the procedure and the sedation                            options and risks were discussed with the patient.                            All questions were answered, and informed consent                            was obtained. Prior Anticoagulants: The patient has                            taken no anticoagulant or antiplatelet agents. ASA                            Grade Assessment: II - A patient with mild systemic                            disease. After reviewing the risks and benefits,                            the patient was deemed in satisfactory condition to  undergo the procedure.                           After obtaining informed consent, the colonoscope                            was passed under direct vision. Throughout the                            procedure, the patient's blood pressure, pulse, and                            oxygen saturations were monitored continuously. The                            Olympus PCF-H190DL  [id number]) Colonoscope was                            introduced through the anus and advanced to the the                            cecum, identified by appendiceal orifice and                            ileocecal valve. The colonoscopy was performed                            without difficulty. The patient tolerated the                            procedure well. The quality of the bowel                            preparation was good. The ileocecal valve,                            appendiceal orifice, and rectum were photographed. Scope In: 10:12:37 AM Scope Out: 10:29:40 AM Scope Withdrawal Time: 0 hours 13 minutes 55 seconds  Total Procedure Duration: 0 hours 17 minutes 3 seconds  Findings:                 The perianal and digital rectal examinations were                            normal.                           A 3 mm polyp was found in the ascending colon. The                            polyp was flat. The polyp was removed with a cold                            snare. Resection and retrieval were complete.  Multiple small-mouthed diverticula were found in                            the left colon.                           Anal papilla(e) were hypertrophied.                           There was superficial erythema in the distal rectum                            consistent with benign prep artifiact or prolapse                            change. The exam was otherwise without abnormality. Complications:            No immediate complications. Estimated blood loss:                            Minimal. Estimated Blood Loss:     Estimated blood loss was minimal. Impression:               - One 3 mm polyp in the ascending colon, removed                            with a cold snare. Resected and retrieved.                           - Diverticulosis in the left colon.                           - Anal papilla(e) were hypertrophied.                           -  Superficial mild erythema / prolapse change in                            distal rectum.                           - The examination was otherwise normal. Recommendation:           - Patient has a contact number available for                            emergencies. The signs and symptoms of potential                            delayed complications were discussed with the                            patient. Return to normal activities tomorrow.                            Written discharge instructions were provided to the  patient.                           - Resume previous diet.                           - Continue present medications.                           - Await pathology results. Remo Lipps P. Havery Moros, MD 06/17/2022 10:36:44 AM This report has been signed electronically.

## 2022-06-17 NOTE — Progress Notes (Signed)
Pt's states no medical or surgical changes since previsit or office visit. 

## 2022-06-17 NOTE — Patient Instructions (Signed)
YOU HAD AN ENDOSCOPIC PROCEDURE TODAY AT [facility]:   Refer to the procedure report that was given to you for any specific questions about what was found during the examination.  If the procedure report does not answer your questions, please call your gastroenterologist to clarify.  If you requested that your care partner not be given the details of your procedure findings, then the procedure report has been included in a sealed envelope for you to review at your convenience later.  YOU SHOULD EXPECT: Some feelings of bloating in the abdomen. Passage of more gas than usual.  Walking can help get rid of the air that was put into your GI tract during the procedure and reduce the bloating. If you had a lower endoscopy (such as a colonoscopy or flexible sigmoidoscopy) you may notice spotting of blood in your stool or on the toilet paper. If you underwent a bowel prep for your procedure, you may not have a normal bowel movement for a few days.  Please Note:  You might notice some irritation and congestion in your nose or some drainage.  This is from the oxygen used during your procedure.  There is no need for concern and it should clear up in a day or so.  SYMPTOMS TO REPORT IMMEDIATELY:  Following lower endoscopy (colonoscopy or flexible sigmoidoscopy):  Excessive amounts of blood in the stool  Significant tenderness or worsening of abdominal pains  Swelling of the abdomen that is new, acute  Fever of 100F or higher   For urgent or emergent issues, a gastroenterologist can be reached at any hour by calling [phone]. Do not use MyChart messaging for urgent concerns.    DIET:  We do recommend a small meal at first, but then you may proceed to your regular diet.  Drink plenty of fluids but you should avoid alcoholic beverages for 24 hours.  MEDICATIONS: Continue present medications.  HANDOUTS GIVEN TO PATIENT: Polyps, Diverticulosis, Hemorrhoids.  FOLLOW UP: Await  pathology results. Dr. Havery Moros will notify you when he has reviewed pathology results and will give you further guidance for follow up surveillance at that time.  Thank you for allowing [place] to provide for your healthcare needs today.  ACTIVITY:  You should plan to take it easy for the rest of today and you should NOT DRIVE or use heavy machinery until tomorrow (because of the sedation medicines used during the test).    FOLLOW UP: Our staff will call the number listed on your records the next business day following your procedure.  We will call around 7:15- 8:00 am to check on you and address any questions or concerns that you may have regarding the information given to you following your procedure. If we do not reach you, we will leave a message.     If any biopsies were taken you will be contacted by phone or by letter within the next 1-3 weeks.  Please call us at [phone] if you have not heard about the biopsies in 3 weeks.    SIGNATURES/CONFIDENTIALITY: You and/or your care partner have signed paperwork which will be entered into your electronic medical record.  These signatures attest to the fact that that the information above on your After Visit Summary has been reviewed and is understood.  Full responsibility of the confidentiality of this discharge information lies with you and/or your care-partner.

## 2022-06-17 NOTE — Progress Notes (Signed)
Sedate, gd SR, tolerated procedure well, VSS, report to RN 

## 2022-06-17 NOTE — Progress Notes (Signed)
Called to room to assist during endoscopic procedure.  Patient ID and intended procedure confirmed with present staff. Received instructions for my participation in the procedure from the performing physician.  

## 2022-06-18 ENCOUNTER — Telehealth: Payer: Self-pay

## 2022-06-18 NOTE — Telephone Encounter (Signed)
Left message on follow up call. 

## 2022-06-26 ENCOUNTER — Encounter: Payer: Self-pay | Admitting: Gastroenterology

## 2022-06-27 ENCOUNTER — Other Ambulatory Visit: Payer: Self-pay | Admitting: Family Medicine

## 2022-06-27 DIAGNOSIS — Z1231 Encounter for screening mammogram for malignant neoplasm of breast: Secondary | ICD-10-CM

## 2022-07-22 ENCOUNTER — Ambulatory Visit: Payer: Medicare Other | Admitting: Family Medicine

## 2022-07-22 ENCOUNTER — Ambulatory Visit (INDEPENDENT_AMBULATORY_CARE_PROVIDER_SITE_OTHER): Payer: Medicare Other | Admitting: Family Medicine

## 2022-07-22 ENCOUNTER — Encounter: Payer: Self-pay | Admitting: Family Medicine

## 2022-07-22 VITALS — BP 114/60 | HR 72 | Temp 98.6°F | Wt 182.0 lb

## 2022-07-22 DIAGNOSIS — R7302 Impaired glucose tolerance (oral): Secondary | ICD-10-CM | POA: Diagnosis not present

## 2022-07-22 DIAGNOSIS — E039 Hypothyroidism, unspecified: Secondary | ICD-10-CM | POA: Diagnosis not present

## 2022-07-22 DIAGNOSIS — M5416 Radiculopathy, lumbar region: Secondary | ICD-10-CM

## 2022-07-22 DIAGNOSIS — C8330 Diffuse large B-cell lymphoma, unspecified site: Secondary | ICD-10-CM | POA: Diagnosis not present

## 2022-07-22 DIAGNOSIS — G2581 Restless legs syndrome: Secondary | ICD-10-CM | POA: Diagnosis not present

## 2022-07-22 DIAGNOSIS — I10 Essential (primary) hypertension: Secondary | ICD-10-CM | POA: Diagnosis not present

## 2022-07-22 DIAGNOSIS — M48061 Spinal stenosis, lumbar region without neurogenic claudication: Secondary | ICD-10-CM | POA: Diagnosis not present

## 2022-07-22 DIAGNOSIS — R1314 Dysphagia, pharyngoesophageal phase: Secondary | ICD-10-CM

## 2022-07-22 DIAGNOSIS — K219 Gastro-esophageal reflux disease without esophagitis: Secondary | ICD-10-CM | POA: Diagnosis not present

## 2022-07-22 DIAGNOSIS — N1831 Chronic kidney disease, stage 3a: Secondary | ICD-10-CM | POA: Diagnosis not present

## 2022-07-22 DIAGNOSIS — I731 Thromboangiitis obliterans [Buerger's disease]: Secondary | ICD-10-CM

## 2022-07-22 LAB — LIPID PANEL
Cholesterol: 194 mg/dL (ref 0–200)
HDL: 53 mg/dL (ref >39.00)
LDL Cholesterol: 124 mg/dL — ABNORMAL HIGH (ref 0–99)
NonHDL: 141.29
Total CHOL/HDL Ratio: 4
Triglycerides: 87 mg/dL (ref 0.0–149.0)
VLDL: 17.4 mg/dL (ref 0.0–40.0)

## 2022-07-22 LAB — BASIC METABOLIC PANEL
BUN: 16 mg/dL (ref 6–23)
CO2: 27 mEq/L (ref 19–32)
Calcium: 9.2 mg/dL (ref 8.4–10.5)
Chloride: 102 mEq/L (ref 96–112)
Creatinine, Ser: 1.33 mg/dL — ABNORMAL HIGH (ref 0.40–1.20)
GFR: 39.03 mL/min — ABNORMAL LOW (ref >60.00)
Glucose, Bld: 100 mg/dL — ABNORMAL HIGH (ref 70–99)
Potassium: 4.4 mEq/L (ref 3.5–5.1)
Sodium: 136 mEq/L (ref 135–145)

## 2022-07-22 LAB — HEPATIC FUNCTION PANEL
ALT: 13 U/L (ref 0–35)
AST: 22 U/L (ref 0–37)
Albumin: 3.9 g/dL (ref 3.5–5.2)
Alkaline Phosphatase: 71 U/L (ref 39–117)
Bilirubin, Direct: 0.2 mg/dL (ref 0.0–0.3)
Total Bilirubin: 0.9 mg/dL (ref 0.2–1.2)
Total Protein: 7.5 g/dL (ref 6.0–8.3)

## 2022-07-22 LAB — CBC WITH DIFFERENTIAL/PLATELET
Basophils Absolute: 0.1 10*3/uL (ref 0.0–0.1)
Basophils Relative: 0.7 % (ref 0.0–3.0)
Eosinophils Absolute: 0.4 10*3/uL (ref 0.0–0.7)
Eosinophils Relative: 3.8 % (ref 0.0–5.0)
HCT: 36.4 % (ref 36.0–46.0)
Hemoglobin: 12.1 g/dL (ref 12.0–15.0)
Lymphocytes Relative: 10.8 % — ABNORMAL LOW (ref 12.0–46.0)
Lymphs Abs: 1.1 10*3/uL (ref 0.7–4.0)
MCHC: 33.3 g/dL (ref 30.0–36.0)
MCV: 80.3 fl (ref 78.0–100.0)
Monocytes Absolute: 1.2 10*3/uL — ABNORMAL HIGH (ref 0.1–1.0)
Monocytes Relative: 12.4 % — ABNORMAL HIGH (ref 3.0–12.0)
Neutro Abs: 7.1 10*3/uL (ref 1.4–7.7)
Neutrophils Relative %: 72.3 % (ref 43.0–77.0)
Platelets: 274 10*3/uL (ref 150.0–400.0)
RBC: 4.53 Mil/uL (ref 3.87–5.11)
RDW: 15.3 % (ref 11.5–15.5)
WBC: 9.8 10*3/uL (ref 4.0–10.5)

## 2022-07-22 LAB — T4, FREE: Free T4: 1.37 ng/dL (ref 0.60–1.60)

## 2022-07-22 LAB — HEMOGLOBIN A1C: Hgb A1c MFr Bld: 6.1 % (ref 4.6–6.5)

## 2022-07-22 LAB — T3, FREE: T3, Free: 2.5 pg/mL (ref 2.3–4.2)

## 2022-07-22 LAB — TSH: TSH: 0.39 u[IU]/mL (ref 0.35–5.50)

## 2022-07-22 NOTE — Progress Notes (Addendum)
Subjective:    Patient ID: Kristen Escobar, female    DOB: November 17, 1945, 77 y.o.   MRN: [id number]  HPI Here to follow up on issues. She saw Dr. Elease Hashimoto on 04-22-22 and he increased her Amlodipine to 10 mg daily. Her BP has been stable at home since then. She feels well and has no complaints. She saw Dr. Lindi Adie in January to follow up her lymphoma. She has been in remission for 5 years now, so he will only see her as needed from now on. We will follow LDH levels now. Her GERD is stable. She is working out at [employer] 5 days a week. Her low back pain is stable. Her restless legs are doing well, and she has not used the Pramipexole for several years. She has a mammogram coming up in a few weeks.    Review of Systems  Constitutional: Negative.   HENT: Negative.    Eyes: Negative.   Respiratory: Negative.    Cardiovascular: Negative.   Gastrointestinal: Negative.   Genitourinary:  Negative for decreased urine volume, difficulty urinating, dyspareunia, dysuria, enuresis, flank pain, frequency, hematuria, pelvic pain and urgency.  Musculoskeletal:  Positive for back pain.  Skin: Negative.   Neurological: Negative.  Negative for headaches.  Psychiatric/Behavioral: Negative.         Objective:   Physical Exam Constitutional:      General: She is not in acute distress.    Appearance: She is well-developed. She is obese.  HENT:     Head: Normocephalic and atraumatic.     Right Ear: External ear normal.     Left Ear: External ear normal.     Nose: Nose normal.     Mouth/Throat:     Pharynx: No oropharyngeal exudate.  Eyes:     General: No scleral icterus.    Conjunctiva/sclera: Conjunctivae normal.     Pupils: Pupils are equal, round, and reactive to light.  Neck:     Thyroid: No thyromegaly.     Vascular: No JVD.  Cardiovascular:     Rate and Rhythm: Normal rate and regular rhythm.     Heart sounds: No murmur heard.    No friction rub. No gallop.     Comments: She has a 2/6 SM   Pulmonary:     Effort: Pulmonary effort is normal. No respiratory distress.     Breath sounds: Normal breath sounds. No wheezing or rales.  Chest:     Chest wall: No tenderness.  Abdominal:     General: Bowel sounds are normal. There is no distension.     Palpations: Abdomen is soft. There is no mass.     Tenderness: There is no abdominal tenderness. There is no guarding or rebound.  Musculoskeletal:        General: No tenderness. Normal range of motion.     Cervical back: Normal range of motion and neck supple.  Lymphadenopathy:     Cervical: No cervical adenopathy.  Skin:    General: Skin is warm and dry.     Findings: No erythema or rash.  Neurological:     Mental Status: She is alert and oriented to person, place, and time.     Cranial Nerves: No cranial nerve deficit.     Motor: No abnormal muscle tone.     Coordination: Coordination normal.     Deep Tendon Reflexes: Reflexes are normal and symmetric. Reflexes normal.  Psychiatric:        Behavior: Behavior normal.  Thought Content: Thought content normal.        Judgment: Judgment normal.           Assessment & Plan:  She seems to be doing well overall. Her HTN is well controlled. Her GERD and restless legs are well controlled. Her CKD and hypothyroidism are stable. She is in remission from the lymphoma. We will get fasting labs to check lipids, a thyroid panel, etc. We spent a total of ( 34  ) minutes reviewing records and discussing these issues.  Alysia Penna, MD

## 2022-07-23 ENCOUNTER — Telehealth: Payer: Self-pay | Admitting: Family Medicine

## 2022-07-23 ENCOUNTER — Telehealth: Payer: Self-pay | Admitting: *Deleted

## 2022-07-23 LAB — SPECIMEN COMPROMISED

## 2022-07-23 LAB — LACTATE DEHYDROGENASE: LDH: 295 U/L — ABNORMAL HIGH (ref 120–250)

## 2022-07-23 NOTE — Telephone Encounter (Signed)
Pt wants to discuss the lab entry that states "specimen compromised"

## 2022-07-23 NOTE — Telephone Encounter (Signed)
Spoke with pt advised per Dr Sarajane Jews the issues with the lab LDH, aware to have Dr Geralyn Flash office repeat the lab at her office. Pt verbalized understanding

## 2022-07-23 NOTE — Telephone Encounter (Signed)
Received call from pt with complaint of right breast redness, warmth x 2 days.  Pt also states she recently had lab work drawn by PCP and LDH was abnormal.  Pt requesting to be seen by MD for further evaluation and tx.  [facility] visit scheduled with MD, pt verbalized understanding of appt date and time.

## 2022-07-23 NOTE — Telephone Encounter (Signed)
I see what she is referring to. Sorry but I missed that before. Apparently the blood sample was not spun before storing it, and this can result in a falsely elevated LDH result. I was going to simply repeat the test, but I see she is scheduled to see Dr. Lindi Adie later this week. He can repeat the test at that time.

## 2022-07-24 NOTE — Progress Notes (Signed)
Patient Care Team: Serena Croissant, MD as PCP - General (Hematology and Oncology) Drue Second, MD as Consulting Physician (Internal Medicine) Lurline Hare, MD as Consulting Physician (Radiation Oncology) Serena Croissant, MD as Consulting Physician (Hematology and Oncology) Ollen Gross, MD as Consulting Physician (Orthopedic Surgery) Arminda Resides, MD as Consulting Physician (Dermatology) Manning Charity, OD as Consulting Physician (Optometry) Barnie Alderman, MD as Consulting Physician (Otolaryngology)  DIAGNOSIS:  Encounter Diagnoses  Name Primary?   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 Radiation recall related to tamoxifen   11/25/2010 -  Anti-estrogen oral therapy   Aromasin 25 mg daily.myalgias and arthralgias stop November 2012 went back to tamoxifen 20 mg daily   01/06/2017 Pathology Results   Bone marrow biopsy: Slightly hypercellular bone marrow for age with granulocytic hyperplasia, numerous small lymphoid aggregates present. This may represent minimal involvement by low-grade B-cell lymphoma or liver process like SLL/CLL because they are CD5 positive   Diffuse large B cell lymphoma  12/26/2016 Initial Diagnosis   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 with above-mentioned history of breast cancer and large B cell lymphoma having undergone a right lumpectomy, chemotherapy, and radiation therapy. She presents to the clinic today for follow-up. She complains of right breast redness. Denies any pain or discomfort in breast. She had a fever 2 days ago, but it subsided on it's own without taking anything.  She tells me that she spent 3 hours at a ball game and sunburn significantly and subsequently the breast redness showed up.  She had an episode of radiation recall a couple of years after her initial radiation as well.  She does not complain of any pain or throbbing at this time.   ALLERGIES:  has No Known Allergies.  MEDICATIONS:  Current Outpatient Medications  Medication Sig Dispense Refill   amLODipine (NORVASC) 10 MG tablet Take 1 tablet (10 mg total) by mouth daily. 90 tablet 3   levothyroxine (SYNTHROID) 75 MCG tablet Take 1 tablet (75 mcg total) by mouth daily. 90 tablet 3   losartan-hydrochlorothiazide (HYZAAR) 50-12.5 MG tablet Take 1 tablet by mouth daily. 90 tablet 3   Multiple Vitamins-Minerals (ADULT GUMMY PO) Take 2 tablets by mouth daily. Woman's Gummy     omeprazole (PRILOSEC) 20 MG capsule Take 1 capsule (20 mg total) by mouth daily. 90 capsule 3   pramipexole (MIRAPEX) 0.5 MG tablet  TAKE 1 TABLET BY MOUTH AT BEDTIME. (Patient taking differently: Take 0.5 mg by mouth as needed.) 90 tablet 0   Current Facility-Administered Medications  Medication Dose Route  Frequency Provider Last Rate Last Admin   0.9 %  sodium chloride infusion  500 mL Intravenous Once Armbruster, Willaim Rayas, MD        PHYSICAL EXAMINATION: ECOG PERFORMANCE STATUS: 1 - Symptomatic but completely ambulatory  Vitals:   07/25/22 0826  BP: (!) 154/59  Pulse: 83  Resp: 18  Temp: (!) 97.5 F (36.4 C)  SpO2: 99%   Filed Weights   07/25/22 0826  Weight: 181 lb 1.6 oz (82.1 kg)    BREAST: Slight pinkish change in the right breast.  Slight tenderness but no increase in warmth.  (exam performed in the presence of a chaperone)  LABORATORY DATA:  I have reviewed the data as listed    Latest Ref Rng & Units 07/22/2022    8:56 AM 05/08/2022   10:43 AM 01/22/2022    9:19 AM  CMP  Glucose 70 - 99 mg/dL 030  97  97   BUN 6 - 23 mg/dL 16  12  27    Creatinine 0.40 - 1.20 mg/dL 0.92  3.30  0.76   Sodium 135 - 145 mEq/L 136  139  132   Potassium 3.5 - 5.1 mEq/L 4.4  3.8  4.9   Chloride 96 - 112 mEq/L 102  105  98   CO2 19 - 32 mEq/L 27  28  24    Calcium 8.4 - 10.5 mg/dL 9.2  9.2  9.4   Total Protein 6.0 - 8.3 g/dL 7.5  6.7    Total Bilirubin 0.2 - 1.2 mg/dL 0.9  0.5    Alkaline Phos 39 - 117 U/L 71  68    AST 0 - 37 U/L 22  17    ALT 0 - 35 U/L 13  9      Lab Results  Component Value Date   WBC 9.8 07/22/2022   HGB 12.1 07/22/2022   HCT 36.4 07/22/2022   MCV 80.3 07/22/2022   PLT 274.0 07/22/2022   NEUTROABS 7.1 07/22/2022    ASSESSMENT & PLAN:  Breast cancer of upper-outer quadrant of right female breast (HCC) 04/2009: Neoadjuvant antiestrogen therapy with tamoxifen x 10 months 04/07/2010: Right lumpectomy: Invasive ductal lobular carcinoma grade 2, 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  Since she does not have any current fevers I did not recommend antibiotics. Mammogram scheduled for 08/11/2022  Elevated LDH: It was up to 295 and there is a possibility that the lab test was compromised and therefore we will repeat it today.  I will call her with the result of this test later today.  Orders Placed This Encounter  Procedures   Lactate dehydrogenase    Standing Status:   Future    Number of Occurrences:   1    Standing Expiration Date:   07/25/2023   The patient has a good understanding of the overall plan. she agrees with it. she will call with any problems that may develop before the next visit here. Total time spent: 30 mins including face to face time and time spent for planning, charting and co-ordination  of care   Tamsen Meek, MD 07/25/22    I Janan Ridge am acting as a Neurosurgeon for [employer]  I have reviewed the above documentation for accuracy and completeness, and I agree with the above.

## 2022-07-25 ENCOUNTER — Inpatient Hospital Stay: Payer: Medicare Other

## 2022-07-25 ENCOUNTER — Inpatient Hospital Stay: Payer: Medicare Other | Attending: Hematology and Oncology | Admitting: Hematology and Oncology

## 2022-07-25 VITALS — BP 154/59 | HR 83 | Temp 97.5°F | Resp 18 | Ht 60.5 in | Wt 181.1 lb

## 2022-07-25 DIAGNOSIS — C8330 Diffuse large B-cell lymphoma, unspecified site: Secondary | ICD-10-CM | POA: Diagnosis not present

## 2022-07-25 DIAGNOSIS — Z853 Personal history of malignant neoplasm of breast: Secondary | ICD-10-CM | POA: Diagnosis not present

## 2022-07-25 DIAGNOSIS — C50411 Malignant neoplasm of upper-outer quadrant of right female breast: Secondary | ICD-10-CM | POA: Diagnosis not present

## 2022-07-25 DIAGNOSIS — Z170 Estrogen receptor positive status [ER+]: Secondary | ICD-10-CM | POA: Diagnosis not present

## 2022-07-25 DIAGNOSIS — Z923 Personal history of irradiation: Secondary | ICD-10-CM | POA: Insufficient documentation

## 2022-07-25 DIAGNOSIS — C8339 Diffuse large B-cell lymphoma, extranodal and solid organ sites: Secondary | ICD-10-CM | POA: Insufficient documentation

## 2022-07-25 LAB — LACTATE DEHYDROGENASE: LDH: 200 U/L — ABNORMAL HIGH (ref 98–192)

## 2022-07-25 NOTE — Assessment & Plan Note (Signed)
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

## 2022-07-28 ENCOUNTER — Encounter: Payer: Self-pay | Admitting: Family Medicine

## 2022-07-30 NOTE — Telephone Encounter (Signed)
Excellent

## 2022-08-11 ENCOUNTER — Ambulatory Visit
Admission: RE | Admit: 2022-08-11 | Discharge: 2022-08-11 | Disposition: A | Discharge status: Home / Self Care | Payer: Medicare Other | Source: Ambulatory Visit | Attending: Family Medicine | Admitting: Family Medicine

## 2022-08-11 DIAGNOSIS — Z1231 Encounter for screening mammogram for malignant neoplasm of breast: Secondary | ICD-10-CM | POA: Diagnosis not present

## 2022-08-18 DIAGNOSIS — R5381 Other malaise: Secondary | ICD-10-CM | POA: Diagnosis not present

## 2022-08-18 DIAGNOSIS — M25552 Pain in left hip: Secondary | ICD-10-CM | POA: Diagnosis not present

## 2022-08-18 DIAGNOSIS — E669 Obesity, unspecified: Secondary | ICD-10-CM | POA: Diagnosis not present

## 2022-08-18 DIAGNOSIS — M79651 Pain in right thigh: Secondary | ICD-10-CM | POA: Diagnosis not present

## 2022-08-18 DIAGNOSIS — Z7409 Other reduced mobility: Secondary | ICD-10-CM | POA: Diagnosis not present

## 2022-08-18 DIAGNOSIS — N1832 Chronic kidney disease, stage 3b: Secondary | ICD-10-CM | POA: Diagnosis present

## 2022-08-18 DIAGNOSIS — N179 Acute kidney failure, unspecified: Secondary | ICD-10-CM | POA: Diagnosis present

## 2022-08-18 DIAGNOSIS — Z96651 Presence of right artificial knee joint: Secondary | ICD-10-CM | POA: Diagnosis not present

## 2022-08-18 DIAGNOSIS — Z6836 Body mass index (BMI) 36.0-36.9, adult: Secondary | ICD-10-CM | POA: Diagnosis not present

## 2022-08-18 DIAGNOSIS — I129 Hypertensive chronic kidney disease with stage 1 through stage 4 chronic kidney disease, or unspecified chronic kidney disease: Secondary | ICD-10-CM | POA: Diagnosis present

## 2022-08-18 DIAGNOSIS — Z9181 History of falling: Secondary | ICD-10-CM | POA: Diagnosis not present

## 2022-08-18 DIAGNOSIS — I10 Essential (primary) hypertension: Secondary | ICD-10-CM | POA: Diagnosis not present

## 2022-08-18 DIAGNOSIS — R339 Retention of urine, unspecified: Secondary | ICD-10-CM | POA: Diagnosis not present

## 2022-08-18 DIAGNOSIS — M25551 Pain in right hip: Secondary | ICD-10-CM | POA: Diagnosis not present

## 2022-08-18 DIAGNOSIS — Z01818 Encounter for other preprocedural examination: Secondary | ICD-10-CM | POA: Diagnosis not present

## 2022-08-18 DIAGNOSIS — S72461A Displaced supracondylar fracture with intracondylar extension of lower end of right femur, initial encounter for closed fracture: Secondary | ICD-10-CM | POA: Diagnosis not present

## 2022-08-18 DIAGNOSIS — M9701XA Periprosthetic fracture around internal prosthetic right hip joint, initial encounter: Secondary | ICD-10-CM | POA: Diagnosis not present

## 2022-08-18 DIAGNOSIS — M25561 Pain in right knee: Secondary | ICD-10-CM | POA: Diagnosis not present

## 2022-08-18 DIAGNOSIS — N189 Chronic kidney disease, unspecified: Secondary | ICD-10-CM | POA: Diagnosis not present

## 2022-08-18 DIAGNOSIS — Z789 Other specified health status: Secondary | ICD-10-CM | POA: Diagnosis not present

## 2022-08-18 DIAGNOSIS — D649 Anemia, unspecified: Secondary | ICD-10-CM | POA: Diagnosis not present

## 2022-08-18 DIAGNOSIS — R52 Pain, unspecified: Secondary | ICD-10-CM | POA: Diagnosis not present

## 2022-08-18 DIAGNOSIS — E039 Hypothyroidism, unspecified: Secondary | ICD-10-CM | POA: Diagnosis present

## 2022-08-18 DIAGNOSIS — Z79890 Hormone replacement therapy (postmenopausal): Secondary | ICD-10-CM | POA: Diagnosis not present

## 2022-08-18 DIAGNOSIS — D631 Anemia in chronic kidney disease: Secondary | ICD-10-CM | POA: Diagnosis present

## 2022-08-18 DIAGNOSIS — R531 Weakness: Secondary | ICD-10-CM | POA: Diagnosis not present

## 2022-08-18 DIAGNOSIS — Z96641 Presence of right artificial hip joint: Secondary | ICD-10-CM | POA: Diagnosis not present

## 2022-08-18 DIAGNOSIS — Z96643 Presence of artificial hip joint, bilateral: Secondary | ICD-10-CM | POA: Diagnosis present

## 2022-08-18 DIAGNOSIS — R519 Headache, unspecified: Secondary | ICD-10-CM | POA: Diagnosis not present

## 2022-08-18 DIAGNOSIS — Z853 Personal history of malignant neoplasm of breast: Secondary | ICD-10-CM | POA: Diagnosis not present

## 2022-08-18 DIAGNOSIS — R739 Hyperglycemia, unspecified: Secondary | ICD-10-CM | POA: Diagnosis not present

## 2022-08-18 DIAGNOSIS — M9711XA Periprosthetic fracture around internal prosthetic right knee joint, initial encounter: Secondary | ICD-10-CM | POA: Diagnosis not present

## 2022-08-18 DIAGNOSIS — G4700 Insomnia, unspecified: Secondary | ICD-10-CM | POA: Diagnosis not present

## 2022-08-18 DIAGNOSIS — Z743 Need for continuous supervision: Secondary | ICD-10-CM | POA: Diagnosis not present

## 2022-08-18 DIAGNOSIS — S72351A Displaced comminuted fracture of shaft of right femur, initial encounter for closed fracture: Secondary | ICD-10-CM | POA: Diagnosis not present

## 2022-08-18 DIAGNOSIS — S72401A Unspecified fracture of lower end of right femur, initial encounter for closed fracture: Secondary | ICD-10-CM | POA: Diagnosis not present

## 2022-08-18 DIAGNOSIS — K219 Gastro-esophageal reflux disease without esophagitis: Secondary | ICD-10-CM | POA: Diagnosis present

## 2022-08-18 DIAGNOSIS — S72451A Displaced supracondylar fracture without intracondylar extension of lower end of right femur, initial encounter for closed fracture: Secondary | ICD-10-CM | POA: Diagnosis present

## 2022-08-18 DIAGNOSIS — Z8572 Personal history of non-Hodgkin lymphomas: Secondary | ICD-10-CM | POA: Diagnosis not present

## 2022-08-18 DIAGNOSIS — R269 Unspecified abnormalities of gait and mobility: Secondary | ICD-10-CM | POA: Diagnosis not present

## 2022-08-19 DIAGNOSIS — Z96651 Presence of right artificial knee joint: Secondary | ICD-10-CM | POA: Diagnosis not present

## 2022-08-19 DIAGNOSIS — D649 Anemia, unspecified: Secondary | ICD-10-CM | POA: Diagnosis not present

## 2022-08-19 DIAGNOSIS — S72401A Unspecified fracture of lower end of right femur, initial encounter for closed fracture: Secondary | ICD-10-CM | POA: Diagnosis not present

## 2022-08-19 DIAGNOSIS — Z6836 Body mass index (BMI) 36.0-36.9, adult: Secondary | ICD-10-CM | POA: Diagnosis not present

## 2022-08-19 DIAGNOSIS — G4700 Insomnia, unspecified: Secondary | ICD-10-CM | POA: Diagnosis present

## 2022-08-19 DIAGNOSIS — Z9181 History of falling: Secondary | ICD-10-CM | POA: Diagnosis not present

## 2022-08-19 DIAGNOSIS — Z79890 Hormone replacement therapy (postmenopausal): Secondary | ICD-10-CM | POA: Diagnosis not present

## 2022-08-19 DIAGNOSIS — E039 Hypothyroidism, unspecified: Secondary | ICD-10-CM | POA: Diagnosis present

## 2022-08-19 DIAGNOSIS — M9701XA Periprosthetic fracture around internal prosthetic right hip joint, initial encounter: Secondary | ICD-10-CM | POA: Diagnosis not present

## 2022-08-19 DIAGNOSIS — R5381 Other malaise: Secondary | ICD-10-CM | POA: Diagnosis not present

## 2022-08-19 DIAGNOSIS — Z853 Personal history of malignant neoplasm of breast: Secondary | ICD-10-CM | POA: Diagnosis not present

## 2022-08-19 DIAGNOSIS — R739 Hyperglycemia, unspecified: Secondary | ICD-10-CM | POA: Diagnosis not present

## 2022-08-19 DIAGNOSIS — I10 Essential (primary) hypertension: Secondary | ICD-10-CM | POA: Diagnosis not present

## 2022-08-19 DIAGNOSIS — E669 Obesity, unspecified: Secondary | ICD-10-CM | POA: Diagnosis not present

## 2022-08-19 DIAGNOSIS — S72451A Displaced supracondylar fracture without intracondylar extension of lower end of right femur, initial encounter for closed fracture: Secondary | ICD-10-CM | POA: Diagnosis present

## 2022-08-19 DIAGNOSIS — Z96641 Presence of right artificial hip joint: Secondary | ICD-10-CM | POA: Diagnosis not present

## 2022-08-19 DIAGNOSIS — N179 Acute kidney failure, unspecified: Secondary | ICD-10-CM | POA: Diagnosis present

## 2022-08-19 DIAGNOSIS — R531 Weakness: Secondary | ICD-10-CM | POA: Diagnosis not present

## 2022-08-19 DIAGNOSIS — Z789 Other specified health status: Secondary | ICD-10-CM | POA: Diagnosis not present

## 2022-08-19 DIAGNOSIS — K219 Gastro-esophageal reflux disease without esophagitis: Secondary | ICD-10-CM | POA: Diagnosis present

## 2022-08-19 DIAGNOSIS — M9711XA Periprosthetic fracture around internal prosthetic right knee joint, initial encounter: Secondary | ICD-10-CM | POA: Diagnosis present

## 2022-08-19 DIAGNOSIS — Z7409 Other reduced mobility: Secondary | ICD-10-CM | POA: Diagnosis not present

## 2022-08-19 DIAGNOSIS — N1832 Chronic kidney disease, stage 3b: Secondary | ICD-10-CM | POA: Diagnosis present

## 2022-08-19 DIAGNOSIS — Z8572 Personal history of non-Hodgkin lymphomas: Secondary | ICD-10-CM | POA: Diagnosis not present

## 2022-08-19 DIAGNOSIS — Z96643 Presence of artificial hip joint, bilateral: Secondary | ICD-10-CM | POA: Diagnosis present

## 2022-08-19 DIAGNOSIS — I129 Hypertensive chronic kidney disease with stage 1 through stage 4 chronic kidney disease, or unspecified chronic kidney disease: Secondary | ICD-10-CM | POA: Diagnosis present

## 2022-08-19 DIAGNOSIS — N189 Chronic kidney disease, unspecified: Secondary | ICD-10-CM | POA: Diagnosis not present

## 2022-08-19 DIAGNOSIS — R339 Retention of urine, unspecified: Secondary | ICD-10-CM | POA: Diagnosis not present

## 2022-08-19 DIAGNOSIS — D631 Anemia in chronic kidney disease: Secondary | ICD-10-CM | POA: Diagnosis present

## 2022-08-19 DIAGNOSIS — S72461A Displaced supracondylar fracture with intracondylar extension of lower end of right femur, initial encounter for closed fracture: Secondary | ICD-10-CM | POA: Diagnosis not present

## 2022-08-19 DIAGNOSIS — R269 Unspecified abnormalities of gait and mobility: Secondary | ICD-10-CM | POA: Diagnosis not present

## 2022-08-21 ENCOUNTER — Other Ambulatory Visit: Payer: Self-pay | Admitting: Family Medicine

## 2022-08-22 DIAGNOSIS — K219 Gastro-esophageal reflux disease without esophagitis: Secondary | ICD-10-CM | POA: Diagnosis not present

## 2022-08-22 DIAGNOSIS — M9711XD Periprosthetic fracture around internal prosthetic right knee joint, subsequent encounter: Secondary | ICD-10-CM | POA: Diagnosis not present

## 2022-08-22 DIAGNOSIS — E1122 Type 2 diabetes mellitus with diabetic chronic kidney disease: Secondary | ICD-10-CM | POA: Diagnosis not present

## 2022-08-22 DIAGNOSIS — N189 Chronic kidney disease, unspecified: Secondary | ICD-10-CM | POA: Diagnosis not present

## 2022-08-22 DIAGNOSIS — I129 Hypertensive chronic kidney disease with stage 1 through stage 4 chronic kidney disease, or unspecified chronic kidney disease: Secondary | ICD-10-CM | POA: Diagnosis not present

## 2022-08-22 DIAGNOSIS — Z853 Personal history of malignant neoplasm of breast: Secondary | ICD-10-CM | POA: Diagnosis not present

## 2022-08-22 DIAGNOSIS — S7291XA Unspecified fracture of right femur, initial encounter for closed fracture: Secondary | ICD-10-CM | POA: Diagnosis not present

## 2022-08-22 DIAGNOSIS — S7291XD Unspecified fracture of right femur, subsequent encounter for closed fracture with routine healing: Secondary | ICD-10-CM | POA: Diagnosis not present

## 2022-08-22 DIAGNOSIS — Z9181 History of falling: Secondary | ICD-10-CM | POA: Diagnosis not present

## 2022-08-22 DIAGNOSIS — E039 Hypothyroidism, unspecified: Secondary | ICD-10-CM | POA: Diagnosis not present

## 2022-08-22 DIAGNOSIS — Z7901 Long term (current) use of anticoagulants: Secondary | ICD-10-CM | POA: Diagnosis not present

## 2022-08-25 DIAGNOSIS — S7291XD Unspecified fracture of right femur, subsequent encounter for closed fracture with routine healing: Secondary | ICD-10-CM | POA: Diagnosis not present

## 2022-08-25 DIAGNOSIS — I129 Hypertensive chronic kidney disease with stage 1 through stage 4 chronic kidney disease, or unspecified chronic kidney disease: Secondary | ICD-10-CM | POA: Diagnosis not present

## 2022-08-25 DIAGNOSIS — M9711XD Periprosthetic fracture around internal prosthetic right knee joint, subsequent encounter: Secondary | ICD-10-CM | POA: Diagnosis not present

## 2022-08-25 DIAGNOSIS — K219 Gastro-esophageal reflux disease without esophagitis: Secondary | ICD-10-CM | POA: Diagnosis not present

## 2022-08-25 DIAGNOSIS — E1122 Type 2 diabetes mellitus with diabetic chronic kidney disease: Secondary | ICD-10-CM | POA: Diagnosis not present

## 2022-08-25 DIAGNOSIS — N189 Chronic kidney disease, unspecified: Secondary | ICD-10-CM | POA: Diagnosis not present

## 2022-08-27 DIAGNOSIS — E1122 Type 2 diabetes mellitus with diabetic chronic kidney disease: Secondary | ICD-10-CM | POA: Diagnosis not present

## 2022-08-27 DIAGNOSIS — K219 Gastro-esophageal reflux disease without esophagitis: Secondary | ICD-10-CM | POA: Diagnosis not present

## 2022-08-27 DIAGNOSIS — N189 Chronic kidney disease, unspecified: Secondary | ICD-10-CM | POA: Diagnosis not present

## 2022-08-27 DIAGNOSIS — M9711XD Periprosthetic fracture around internal prosthetic right knee joint, subsequent encounter: Secondary | ICD-10-CM | POA: Diagnosis not present

## 2022-08-27 DIAGNOSIS — I129 Hypertensive chronic kidney disease with stage 1 through stage 4 chronic kidney disease, or unspecified chronic kidney disease: Secondary | ICD-10-CM | POA: Diagnosis not present

## 2022-08-27 DIAGNOSIS — S7291XD Unspecified fracture of right femur, subsequent encounter for closed fracture with routine healing: Secondary | ICD-10-CM | POA: Diagnosis not present

## 2022-08-29 DIAGNOSIS — N189 Chronic kidney disease, unspecified: Secondary | ICD-10-CM | POA: Diagnosis not present

## 2022-08-29 DIAGNOSIS — S7291XD Unspecified fracture of right femur, subsequent encounter for closed fracture with routine healing: Secondary | ICD-10-CM | POA: Diagnosis not present

## 2022-08-29 DIAGNOSIS — E1122 Type 2 diabetes mellitus with diabetic chronic kidney disease: Secondary | ICD-10-CM | POA: Diagnosis not present

## 2022-08-29 DIAGNOSIS — I129 Hypertensive chronic kidney disease with stage 1 through stage 4 chronic kidney disease, or unspecified chronic kidney disease: Secondary | ICD-10-CM | POA: Diagnosis not present

## 2022-08-29 DIAGNOSIS — M9711XD Periprosthetic fracture around internal prosthetic right knee joint, subsequent encounter: Secondary | ICD-10-CM | POA: Diagnosis not present

## 2022-08-29 DIAGNOSIS — K219 Gastro-esophageal reflux disease without esophagitis: Secondary | ICD-10-CM | POA: Diagnosis not present

## 2022-08-30 ENCOUNTER — Emergency Department (HOSPITAL_COMMUNITY): Payer: Medicare Other

## 2022-08-30 ENCOUNTER — Inpatient Hospital Stay (HOSPITAL_COMMUNITY): Payer: Medicare Other

## 2022-08-30 ENCOUNTER — Inpatient Hospital Stay (HOSPITAL_COMMUNITY)
Admission: EM | Admit: 2022-08-30 | Discharge: 2022-09-03 | DRG: 682 | Disposition: A | Discharge status: Home Health | Payer: Medicare Other | Attending: Internal Medicine | Admitting: Internal Medicine

## 2022-08-30 ENCOUNTER — Other Ambulatory Visit: Payer: Self-pay

## 2022-08-30 ENCOUNTER — Encounter (HOSPITAL_COMMUNITY): Payer: Self-pay | Admitting: Emergency Medicine

## 2022-08-30 DIAGNOSIS — N3289 Other specified disorders of bladder: Secondary | ICD-10-CM | POA: Diagnosis not present

## 2022-08-30 DIAGNOSIS — I10 Essential (primary) hypertension: Secondary | ICD-10-CM | POA: Diagnosis not present

## 2022-08-30 DIAGNOSIS — Z471 Aftercare following joint replacement surgery: Secondary | ICD-10-CM | POA: Diagnosis not present

## 2022-08-30 DIAGNOSIS — E872 Acidosis, unspecified: Secondary | ICD-10-CM | POA: Diagnosis present

## 2022-08-30 DIAGNOSIS — M549 Dorsalgia, unspecified: Secondary | ICD-10-CM | POA: Diagnosis present

## 2022-08-30 DIAGNOSIS — N1832 Chronic kidney disease, stage 3b: Secondary | ICD-10-CM | POA: Diagnosis present

## 2022-08-30 DIAGNOSIS — R339 Retention of urine, unspecified: Secondary | ICD-10-CM | POA: Diagnosis not present

## 2022-08-30 DIAGNOSIS — Z89021 Acquired absence of right finger(s): Secondary | ICD-10-CM

## 2022-08-30 DIAGNOSIS — N3001 Acute cystitis with hematuria: Secondary | ICD-10-CM | POA: Diagnosis not present

## 2022-08-30 DIAGNOSIS — I739 Peripheral vascular disease, unspecified: Secondary | ICD-10-CM | POA: Diagnosis present

## 2022-08-30 DIAGNOSIS — N136 Pyonephrosis: Secondary | ICD-10-CM | POA: Diagnosis not present

## 2022-08-30 DIAGNOSIS — Z8249 Family history of ischemic heart disease and other diseases of the circulatory system: Secondary | ICD-10-CM

## 2022-08-30 DIAGNOSIS — R310 Gross hematuria: Secondary | ICD-10-CM | POA: Diagnosis not present

## 2022-08-30 DIAGNOSIS — S7291XA Unspecified fracture of right femur, initial encounter for closed fracture: Secondary | ICD-10-CM | POA: Diagnosis present

## 2022-08-30 DIAGNOSIS — Z803 Family history of malignant neoplasm of breast: Secondary | ICD-10-CM

## 2022-08-30 DIAGNOSIS — D649 Anemia, unspecified: Secondary | ICD-10-CM | POA: Diagnosis not present

## 2022-08-30 DIAGNOSIS — Z853 Personal history of malignant neoplasm of breast: Secondary | ICD-10-CM

## 2022-08-30 DIAGNOSIS — Z9842 Cataract extraction status, left eye: Secondary | ICD-10-CM

## 2022-08-30 DIAGNOSIS — E669 Obesity, unspecified: Secondary | ICD-10-CM | POA: Diagnosis present

## 2022-08-30 DIAGNOSIS — Z9841 Cataract extraction status, right eye: Secondary | ICD-10-CM

## 2022-08-30 DIAGNOSIS — Z79890 Hormone replacement therapy (postmenopausal): Secondary | ICD-10-CM

## 2022-08-30 DIAGNOSIS — N1339 Other hydronephrosis: Secondary | ICD-10-CM | POA: Diagnosis not present

## 2022-08-30 DIAGNOSIS — D62 Acute posthemorrhagic anemia: Secondary | ICD-10-CM | POA: Diagnosis present

## 2022-08-30 DIAGNOSIS — R338 Other retention of urine: Secondary | ICD-10-CM | POA: Diagnosis not present

## 2022-08-30 DIAGNOSIS — W19XXXA Unspecified fall, initial encounter: Secondary | ICD-10-CM | POA: Diagnosis present

## 2022-08-30 DIAGNOSIS — E871 Hypo-osmolality and hyponatremia: Secondary | ICD-10-CM | POA: Diagnosis present

## 2022-08-30 DIAGNOSIS — Z79899 Other long term (current) drug therapy: Secondary | ICD-10-CM

## 2022-08-30 DIAGNOSIS — N179 Acute kidney failure, unspecified: Principal | ICD-10-CM | POA: Diagnosis present

## 2022-08-30 DIAGNOSIS — Z923 Personal history of irradiation: Secondary | ICD-10-CM

## 2022-08-30 DIAGNOSIS — Z96643 Presence of artificial hip joint, bilateral: Secondary | ICD-10-CM | POA: Diagnosis present

## 2022-08-30 DIAGNOSIS — Z8572 Personal history of non-Hodgkin lymphomas: Secondary | ICD-10-CM

## 2022-08-30 DIAGNOSIS — Z89022 Acquired absence of left finger(s): Secondary | ICD-10-CM

## 2022-08-30 DIAGNOSIS — Z96651 Presence of right artificial knee joint: Secondary | ICD-10-CM | POA: Diagnosis not present

## 2022-08-30 DIAGNOSIS — E039 Hypothyroidism, unspecified: Secondary | ICD-10-CM | POA: Diagnosis present

## 2022-08-30 DIAGNOSIS — Z6833 Body mass index (BMI) 33.0-33.9, adult: Secondary | ICD-10-CM | POA: Diagnosis not present

## 2022-08-30 DIAGNOSIS — R319 Hematuria, unspecified: Secondary | ICD-10-CM | POA: Diagnosis not present

## 2022-08-30 DIAGNOSIS — I129 Hypertensive chronic kidney disease with stage 1 through stage 4 chronic kidney disease, or unspecified chronic kidney disease: Secondary | ICD-10-CM | POA: Diagnosis present

## 2022-08-30 DIAGNOSIS — Z9851 Tubal ligation status: Secondary | ICD-10-CM

## 2022-08-30 DIAGNOSIS — N134 Hydroureter: Secondary | ICD-10-CM | POA: Diagnosis not present

## 2022-08-30 DIAGNOSIS — N201 Calculus of ureter: Secondary | ICD-10-CM | POA: Diagnosis not present

## 2022-08-30 DIAGNOSIS — N1330 Unspecified hydronephrosis: Secondary | ICD-10-CM | POA: Diagnosis not present

## 2022-08-30 DIAGNOSIS — D500 Iron deficiency anemia secondary to blood loss (chronic): Secondary | ICD-10-CM | POA: Diagnosis not present

## 2022-08-30 DIAGNOSIS — Z7901 Long term (current) use of anticoagulants: Secondary | ICD-10-CM

## 2022-08-30 DIAGNOSIS — K219 Gastro-esophageal reflux disease without esophagitis: Secondary | ICD-10-CM | POA: Diagnosis present

## 2022-08-30 DIAGNOSIS — Z86711 Personal history of pulmonary embolism: Secondary | ICD-10-CM

## 2022-08-30 DIAGNOSIS — Z87891 Personal history of nicotine dependence: Secondary | ICD-10-CM | POA: Diagnosis not present

## 2022-08-30 DIAGNOSIS — J439 Emphysema, unspecified: Secondary | ICD-10-CM | POA: Diagnosis not present

## 2022-08-30 DIAGNOSIS — N390 Urinary tract infection, site not specified: Secondary | ICD-10-CM | POA: Diagnosis not present

## 2022-08-30 DIAGNOSIS — A419 Sepsis, unspecified organism: Secondary | ICD-10-CM | POA: Diagnosis not present

## 2022-08-30 LAB — CBC WITH DIFFERENTIAL/PLATELET
Abs Immature Granulocytes: 0.11 10*3/uL — ABNORMAL HIGH (ref 0.00–0.07)
Basophils Absolute: 0.1 10*3/uL (ref 0.0–0.1)
Basophils Relative: 0 %
Eosinophils Absolute: 0.4 10*3/uL (ref 0.0–0.5)
Eosinophils Relative: 3 %
HCT: 22.5 % — ABNORMAL LOW (ref 36.0–46.0)
Hemoglobin: 7.1 g/dL — ABNORMAL LOW (ref 12.0–15.0)
Immature Granulocytes: 1 %
Lymphocytes Relative: 9 %
Lymphs Abs: 1.3 10*3/uL (ref 0.7–4.0)
MCH: 26.8 pg (ref 26.0–34.0)
MCHC: 31.6 g/dL (ref 30.0–36.0)
MCV: 84.9 fL (ref 80.0–100.0)
Monocytes Absolute: 0.8 10*3/uL (ref 0.1–1.0)
Monocytes Relative: 6 %
Neutro Abs: 11 10*3/uL — ABNORMAL HIGH (ref 1.7–7.7)
Neutrophils Relative %: 81 %
Platelets: 485 10*3/uL — ABNORMAL HIGH (ref 150–400)
RBC: 2.65 MIL/uL — ABNORMAL LOW (ref 3.87–5.11)
RDW: 17.3 % — ABNORMAL HIGH (ref 11.5–15.5)
WBC: 13.6 10*3/uL — ABNORMAL HIGH (ref 4.0–10.5)
nRBC: 0 % (ref 0.0–0.2)

## 2022-08-30 LAB — BASIC METABOLIC PANEL
Anion gap: 12 (ref 5–15)
Anion gap: 14 (ref 5–15)
BUN: 140 mg/dL — ABNORMAL HIGH (ref 8–23)
BUN: 112 mg/dL — ABNORMAL HIGH (ref 8–23)
CO2: 15 mmol/L — ABNORMAL LOW (ref 22–32)
CO2: 16 mmol/L — ABNORMAL LOW (ref 22–32)
Calcium: 7.9 mg/dL — ABNORMAL LOW (ref 8.9–10.3)
Calcium: 7.9 mg/dL — ABNORMAL LOW (ref 8.9–10.3)
Chloride: 92 mmol/L — ABNORMAL LOW (ref 98–111)
Chloride: 99 mmol/L (ref 98–111)
Creatinine, Ser: 8.6 mg/dL — ABNORMAL HIGH (ref 0.44–1.00)
Creatinine, Ser: 6.74 mg/dL — ABNORMAL HIGH (ref 0.44–1.00)
GFR, Estimated: 4 mL/min — ABNORMAL LOW (ref >60)
GFR, Estimated: 6 mL/min — ABNORMAL LOW (ref >60)
Glucose, Bld: 96 mg/dL (ref 70–99)
Glucose, Bld: 88 mg/dL (ref 70–99)
Potassium: 5.1 mmol/L (ref 3.5–5.1)
Potassium: 4.5 mmol/L (ref 3.5–5.1)
Sodium: 119 mmol/L — CL (ref 135–145)
Sodium: 129 mmol/L — ABNORMAL LOW (ref 135–145)

## 2022-08-30 LAB — HEPATIC FUNCTION PANEL
ALT: 6 U/L (ref 0–44)
AST: 33 U/L (ref 15–41)
Albumin: 3 g/dL — ABNORMAL LOW (ref 3.5–5.0)
Alkaline Phosphatase: 65 U/L (ref 38–126)
Bilirubin, Direct: 0.3 mg/dL — ABNORMAL HIGH (ref 0.0–0.2)
Indirect Bilirubin: 0.6 mg/dL (ref 0.3–0.9)
Total Bilirubin: 0.9 mg/dL (ref 0.3–1.2)
Total Protein: 7.1 g/dL (ref 6.5–8.1)

## 2022-08-30 LAB — PREPARE RBC (CROSSMATCH)

## 2022-08-30 LAB — MRSA NEXT GEN BY PCR, NASAL: MRSA by PCR Next Gen: NOT DETECTED

## 2022-08-30 LAB — TYPE AND SCREEN
ABO/RH(D): A POS
Unit division: 0

## 2022-08-30 LAB — URINALYSIS, ROUTINE W REFLEX MICROSCOPIC
Bacteria, UA: NONE SEEN
Bilirubin Urine: NEGATIVE
Glucose, UA: NEGATIVE mg/dL
Ketones, ur: NEGATIVE mg/dL
Nitrite: POSITIVE — AB
Protein, ur: 100 mg/dL — AB
RBC / HPF: >50 RBC/hpf (ref 0–5)
Specific Gravity, Urine: 1.013 (ref 1.005–1.030)
WBC, UA: >50 WBC/hpf (ref 0–5)
pH: 6 (ref 5.0–8.0)

## 2022-08-30 LAB — BPAM RBC
ISSUE DATE / TIME: 202405111953
Unit Type and Rh: 6200

## 2022-08-30 LAB — CBC
HCT: 19.7 % — ABNORMAL LOW (ref 36.0–46.0)
Hemoglobin: 6.4 g/dL — CL (ref 12.0–15.0)
MCH: 27.5 pg (ref 26.0–34.0)
MCHC: 32.5 g/dL (ref 30.0–36.0)
MCV: 84.5 fL (ref 80.0–100.0)
Platelets: 379 10*3/uL (ref 150–400)
RBC: 2.33 MIL/uL — ABNORMAL LOW (ref 3.87–5.11)
RDW: 17.4 % — ABNORMAL HIGH (ref 11.5–15.5)
WBC: 12.7 10*3/uL — ABNORMAL HIGH (ref 4.0–10.5)
nRBC: 0 % (ref 0.0–0.2)

## 2022-08-30 LAB — LACTIC ACID, PLASMA
Lactic Acid, Venous: 0.9 mmol/L (ref 0.5–1.9)
Lactic Acid, Venous: 0.9 mmol/L (ref 0.5–1.9)

## 2022-08-30 LAB — CK: Total CK: 72 U/L (ref 38–234)

## 2022-08-30 MED ORDER — SODIUM CHLORIDE 0.9% IV SOLUTION: Freq: Once | INTRAVENOUS | Status: DC

## 2022-08-30 MED ORDER — ONDANSETRON HCL 4 MG/2ML IJ SOLN: 4.0000 mg | Freq: Four times a day (QID) | INTRAMUSCULAR | Status: DC | PRN

## 2022-08-30 MED ORDER — FENTANYL CITRATE PF 50 MCG/ML IJ SOSY
50.0000 ug | PREFILLED_SYRINGE | Freq: Once | INTRAMUSCULAR | Status: AC
  Administered 2022-08-30: 50 ug via INTRAVENOUS
  Filled 2022-08-30: qty 1

## 2022-08-30 MED ORDER — LEVOTHYROXINE SODIUM 75 MCG PO TABS
75.0000 ug | ORAL_TABLET | Freq: Every day | ORAL | Status: DC
  Administered 2022-08-31 – 2022-09-03 (×4): 75 ug via ORAL
  Filled 2022-08-30 (×4): qty 1

## 2022-08-30 MED ORDER — SODIUM CHLORIDE 0.9 % IV BOLUS
1000.0000 mL | Freq: Once | INTRAVENOUS | Status: AC
  Administered 2022-08-30: 1000 mL via INTRAVENOUS

## 2022-08-30 MED ORDER — ORAL CARE MOUTH RINSE: 15.0000 mL | OROMUCOSAL | Status: DC | PRN

## 2022-08-30 MED ORDER — SODIUM CHLORIDE 0.9 % IV SOLN
1.0000 g | Freq: Once | INTRAVENOUS | Status: AC
  Administered 2022-08-30: 1 g via INTRAVENOUS
  Filled 2022-08-30: qty 10

## 2022-08-30 MED ORDER — DOCUSATE SODIUM 100 MG PO CAPS
100.0000 mg | ORAL_CAPSULE | Freq: Two times a day (BID) | ORAL | Status: DC | PRN
  Administered 2022-09-02: 100 mg via ORAL
  Filled 2022-08-30: qty 1

## 2022-08-30 MED ORDER — LACTATED RINGERS IV SOLN: INTRAVENOUS | Status: DC

## 2022-08-30 MED ORDER — POLYETHYLENE GLYCOL 3350 17 G PO PACK
17.0000 g | PACK | Freq: Every day | ORAL | Status: DC | PRN
  Filled 2022-08-30: qty 1

## 2022-08-30 MED ORDER — AMLODIPINE BESYLATE 10 MG PO TABS
10.0000 mg | ORAL_TABLET | Freq: Every day | ORAL | Status: DC
  Administered 2022-08-31 – 2022-09-03 (×4): 10 mg via ORAL
  Filled 2022-08-30 (×4): qty 1

## 2022-08-30 MED ORDER — CHLORHEXIDINE GLUCONATE CLOTH 2 % EX PADS
6.0000 | MEDICATED_PAD | Freq: Every day | CUTANEOUS | Status: DC
  Administered 2022-08-30 – 2022-09-02 (×4): 6 via TOPICAL

## 2022-08-30 MED ORDER — SODIUM CHLORIDE 0.9 % IV SOLN
1.0000 g | INTRAVENOUS | Status: DC
  Administered 2022-08-31 – 2022-09-02 (×3): 1 g via INTRAVENOUS
  Filled 2022-08-30 (×4): qty 10

## 2022-08-30 NOTE — ED Notes (Signed)
ED Provider at bedside. 

## 2022-08-30 NOTE — H&P (Addendum)
NAME:  Kristen Escobar, MRN:  [id number], DOB:  1945/07/09, LOS: 0 ADMISSION DATE:  08/30/2022, CONSULTATION DATE:  08/30/2022  REFERRING MD:  Michaell Cowing, ED PA, CHIEF COMPLAINT:  hematuria   History of Present Illness:  77 year old woman who presents with abdominal and back pain and hematuria. She had a fall on the last week of April when she was at [facility] and underwent ORIF for right femur fracture on 4/30.  She was discharged within 3 days on Eliquis due to prior history of pulm embolism.  She had transient urinary retention during hospital stay. Since discharge she complains of constipation and decreased urine output.  She has been mostly bedbound and has been noted hematuria today and brought her to the emergency room.  Daughter works for [employer] and had arranged for home PT and OT Initial labs showed BUN 140/creatinine of 8.6, potassium 5.1. In and out cath showed bloody urine, 1.5 L was removed and then a Foley was placed.  There is another liter in the Foley bag. She reports significant improvement in abdominal pain  Pertinent  Medical History  Diffuse large B-cell lymphoma Hypothyroidism CKD Pulm embolism 2018  Significant Hospital Events: Including procedures, antibiotic start and stop dates in addition to other pertinent events     Interim History / Subjective:  Afebrile Decreased pain Oxygen saturation 98% on room air  Objective   Blood pressure (!) 131/105, pulse 82, temperature 97.6 F (36.4 C), temperature source Oral, resp. rate 18, SpO2 100 %.        Intake/Output Summary (Last 24 hours) at 08/30/2022 1632 Last data filed at 08/30/2022 1615 Gross per 24 hour  Intake 1198.9 ml  Output 1550 ml  Net -351.1 ml   There were no vitals filed for this visit.  Examination: General: Elderly woman able to lie supine, no distress  HENT: No pallor, no icterus, no JVD Lungs: Clear breath sounds bilateral, no accessory muscle use Cardiovascular: S1-S2 regular, no  murmur Abdomen: Soft, nontender, left flank small acute area of ecchymosis Extremities: Right thigh has healed incisions, old healed scar over right knee Neuro: Alert, interactive, nonfocal GU: Bloody urine in Foley bag  Labs show sodium of 119, mild leukocytosis, hemoglobin 7.1  Resolved Hospital Problem list     Assessment & Plan:  AKI due to urinary retention/obstructive uropathy in setting of ARB Hematuria, on Eliquis. Anemia of blood loss  -Obtain renal ultrasound -Expect creatinine to improve now that obstruction is relieved, follow BMET every 8 hours she, she may have a postobstructive diuresis -LR at 150/hour after 2 L bolus, she appears dry -Hold Eliquis, once hematuria is resolved, can use subcu heparin for prophylaxis -Follow hemoglobin every 12 hours -Empiric ceftriaxone to cover UTI  Hyponatremia -expect to correct with IV fluids. Okay to correct acutely since this was an acute development  Hypertension -hold losartan -Resume amlodipine   Best Practice (right click and "Reselect all SmartList Selections" daily)   Diet/type: Regular consistency (see orders) DVT prophylaxis: not indicated GI prophylaxis: N/A Lines: N/A Foley:  N/A Code Status:  full code Last date of multidisciplinary goals of care discussion [NA]  Labs   CBC: Recent Labs  Lab 08/30/22 1604  WBC 13.6*  NEUTROABS 11.0*  HGB 7.1*  HCT 22.5*  MCV 84.9  PLT 485*    Basic Metabolic Panel: Recent Labs  Lab 08/30/22 1330  NA 119*  K 5.1  CL 92*  CO2 15*  GLUCOSE 96  BUN 140*  CREATININE 8.60*  CALCIUM 7.9*   GFR: CrCl cannot be calculated (Unknown ideal weight.). Recent Labs  Lab 08/30/22 1330 08/30/22 1604  WBC  --  13.6*  LATICACIDVEN 0.9  --     Liver Function Tests: Recent Labs  Lab 08/30/22 1330  AST 33  ALT 6  ALKPHOS 65  BILITOT 0.9  PROT 7.1  ALBUMIN 3.0*   No results for input(s): "LIPASE", "AMYLASE" in the last 168 hours. No results for input(s):  "AMMONIA" in the last 168 hours.  ABG    Component Value Date/Time   TCO2 24 11/12/2010 2137     Coagulation Profile: No results for input(s): "INR", "PROTIME" in the last 168 hours.  Cardiac Enzymes: No results for input(s): "CKTOTAL", "CKMB", "CKMBINDEX", "TROPONINI" in the last 168 hours.  HbA1C: Hgb A1c MFr Bld  Date/Time Value Ref Range Status  07/22/2022 08:56 AM 6.1 4.6 - 6.5 % Final    Comment:    Glycemic Control Guidelines for People with Diabetes:Non Diabetic:  <6%Goal of Therapy: <7%Additional Action Suggested:  >8%   06/02/2018 05:35 AM 5.7 (H) 4.8 - 5.6 % Final    Comment:    (NOTE) Pre diabetes:          5.7%-6.4% Diabetes:              >6.4% Glycemic control for   <7.0% adults with diabetes     CBG: No results for input(s): "GLUCAP" in the last 168 hours.  Review of Systems:   Constitutional: negative for anorexia, fevers and sweats  Eyes: negative for irritation, redness and visual disturbance  Ears, nose, mouth, throat, and face: negative for earaches, epistaxis, nasal congestion and sore throat  Respiratory: negative for cough, dyspnea on exertion, sputum and wheezing  Cardiovascular: negative for chest pain, dyspnea, lower extremity edema, orthopnea, palpitations and syncope  Gastrointestinal: negative for , diarrhea, melena, nausea and vomiting  Hematologic/lymphatic: negative for bleeding, easy bruising and lymphadenopathy  Musculoskeletal:negative for arthralgias, muscle weakness and stiff joints  Neurological: negative for coordination problems, gait problems, headaches and weakness  Endocrine: negative for diabetic symptoms including polydipsia, polyuria and weight loss   Past Medical History:  She,  has a past medical history of Acute respiratory failure with hypoxia (HCC) (02/16/2017), Arthritis, Breast cancer (HCC) (03/2009), Chronic kidney disease (08/22/2015), Complication of anesthesia, Diffuse large B cell lymphoma (HCC) (dx'd 02/17/17),  GERD (gastroesophageal reflux disease), Heart murmur, History of radiation therapy (07/12/10,completed), Hypertension, Hypothyroidism, Lymphoma (HCC), Obesity, PE (pulmonary thromboembolism) (HCC) (01/2017), Peripheral vascular disease (HCC) (1995), Personal history of radiation therapy (2012), Pneumonia, Thyroid mass, and Vocal cord paralysis.   Surgical History:   Past Surgical History:  Procedure Laterality Date   AMPUTATION     partial amputation of both index fingers   BREAST LUMPECTOMY Right 04/01/2010   BREAST SURGERY  2011   lumpectomy with node sampling- RIGHT   CATARACT EXTRACTION, BILATERAL  approx 2017   COLONOSCOPY  06/17/2022   per Dr. Adela Lank, one adenomatous polyp, no repeats needed   ESOPHAGOGASTRODUODENOSCOPY     EXCISION MASS NECK Left 12/26/2016   Procedure: EXCISION MASS NECK;  Surgeon: Serena Colonel, MD;  Location: [facility] OR;  Service: ENT;  Laterality: Left;  open excision of thyroid mass left side with frozen section   HAND SURGERY Bilateral 1995   Amputaed pointer fingers bilaterally   IR FLUORO GUIDE PORT INSERTION RIGHT  01/14/2017   IR GASTROSTOMY TUBE MOD SED  02/19/2017   IR GASTROSTOMY TUBE REMOVAL  12/07/2018   IR  PATIENT EVAL TECH 0-60 MINS  01/14/2019   IR REMOVAL TUN ACCESS W/ PORT W/O FL MOD SED  11/30/2017   IR REPLACE G-TUBE SIMPLE WO FLUORO  08/03/2017   IR REPLACE G-TUBE SIMPLE WO FLUORO  06/10/2018   IR REPLC GASTRO/COLONIC TUBE PERCUT W/FLUORO  02/25/2017   IR REPLC GASTRO/COLONIC TUBE PERCUT W/FLUORO  03/09/2017   IR US GUIDE BX ASP/DRAIN  12/07/2018   IR US GUIDE VASC ACCESS RIGHT  01/14/2017   JOINT REPLACEMENT     LAPAROSCOPIC GASTRIC RESECTION N/A 09/09/2019   Procedure: LAPAROSCOPIC CLOSURE OF GASTRO-CUTANEOUS FISTULA, EXCISION OF FISTUAL TRACK;  Surgeon: Ovidio Kin, MD;  Location: [facility] ORS;  Service: General;  Laterality: N/A;   LUMBAR LAMINECTOMY/DECOMPRESSION MICRODISCECTOMY N/A 01/30/2022   Procedure: Microdiscetomy hemilaminectomy  decompression Lumbar Five-Sacral One;  Surgeon: Jene Every, MD;  Location: [facility] OR;  Service: Orthopedics;  Laterality: N/A;  90 mins 3 C-Bed   TONSILLECTOMY     TOTAL HIP ARTHROPLASTY  12/16/2011   right hip   TOTAL HIP ARTHROPLASTY  01/20/2012   Procedure: TOTAL HIP ARTHROPLASTY ANTERIOR APPROACH;  Surgeon: Shelda Pal, MD;  Location: [facility] ORS;  Service: Orthopedics;  Laterality: Left;   TOTAL KNEE ARTHROPLASTY Right 05/31/2018   Procedure: TOTAL KNEE ARTHROPLASTY;  Surgeon: Ollen Gross, MD;  Location: [facility] ORS;  Service: Orthopedics;  Laterality: Right;  Adductor Block   TUBAL LIGATION     VASCULAR SURGERY     both hands     Social History:   reports that she quit smoking about 29 years ago. Her smoking use included cigarettes. She has a 20.00 pack-year smoking history. She has never used smokeless tobacco. She reports that she does not currently use alcohol. She reports that she does not use drugs.   Family History:  Her family history includes Breast cancer in her sister; Cancer in her mother; Coronary artery disease in her brother, brother, and father; Diabetes in her father and sister; Hearing loss in her mother. There is no history of Colon cancer, Stomach cancer, Rectal cancer, or Esophageal cancer.   Allergies No Known Allergies   Home Medications  Prior to Admission medications   Medication Sig Start Date End Date Taking? Authorizing Provider  amLODipine (NORVASC) 10 MG tablet Take 1 tablet (10 mg total) by mouth daily. 04/22/22  Yes Burchette, Elberta Fortis, MD  apixaban (ELIQUIS) 2.5 MG TABS tablet Take 2.5 mg by mouth 2 (two) times daily. 08/21/22  Yes [provider]  levothyroxine (SYNTHROID) 75 MCG tablet Take 1 tablet (75 mcg total) by mouth daily. 04/22/22  Yes Burchette, Elberta Fortis, MD  losartan-hydrochlorothiazide (HYZAAR) 50-12.5 MG tablet TAKE 1 TABLET BY MOUTH EVERY DAY 08/21/22  Yes Burchette, Elberta Fortis, MD  Multiple Vitamins-Minerals (ADULT GUMMY PO) Take 2 tablets  by mouth daily. Woman's Gummy   Yes [provider]  omeprazole (PRILOSEC) 20 MG capsule Take 1 capsule (20 mg total) by mouth daily. 04/22/22  Yes Burchette, Elberta Fortis, MD  pramipexole (MIRAPEX) 0.5 MG tablet TAKE 1 TABLET BY MOUTH AT BEDTIME. Patient not taking: Reported on 08/30/2022 12/24/21   Kristian Covey, MD     Critical care time: 40 m    Cyril Mourning MD. [name]. [place] [facility] Pager : [phone]  If no response to pager , please call [phone] until 7 pm After 7:00 pm call Elink  [phone]   08/30/2022

## 2022-08-30 NOTE — ED Provider Notes (Cosign Needed Addendum)
Kristen Escobar EMERGENCY DEPARTMENT AT [facility] Provider Note   CSN: [id number] Arrival date & time: 08/30/22  1200     History  Chief Complaint  Patient presents with   Hematuria    Kristen Escobar is a 77 y.o. female with medical history of diffuse large B-cell lymphoma, GERD, hypertension, hypothyroid, chronic kidney disease, pulmonary embolism in 2018, recent surgery for femur fracture, patient currently on Eliquis.  Patient presents to ED for evaluation of back pain, abdominal pain, blood in urine, diarrhea.  Patient reports that back in April she was down in [facility] where she suffered a mechanical fall resulting in a femur fracture.  Patient had distal femur fracture that was surgically repaired on 4/30 down in [facility].  During this hospitalization the patient's hemoglobin down trended postsurgically but stabilized, the patient had an AKI on CKD but this improved after fluids, per discharge summary with patient husband.  The patient also had urinary retention at this time which also improved with time, per discharge summary. The patient husband reports a history of urinary retention after orthopedic surgeries. The patient was admitted for 4 days and then discharged on 5/2.  Patient was set to follow-up with orthopedics here in [place] but has not yet done so.  The patient states that over the last 1 week she has had no normal bowel movements, only diarrhea.  The patient reports she is still passing gas.  She is also complaining of hematuria for the last 2 days along with back pain that she states is midline back pain. The hematuria and back pain began at the same time. The patient state she is just "not able to get comfortable". She is also endorsing abdominal pain, feeling very bloated in her lower abdomen. They report that paramedics came to their house a few days ago when they were called for abdominal pain but the patient ultimately was not transported. The patient  and the patient husband at the bedside denies any fevers, nausea vomiting, dysuria, vaginal discharge.  The patient reports she is unable to urinate at will, she can only urinate if she stands up and most of the time she has no control over it.  She states that she had 1 large bowel movement 1 week ago and since then has only had diffuse watery diarrhea that is nonbloody.  She denies chest pain or shortness of breath.  She denies pain to her surgical site. She denies IVDU.   Hematuria Associated symptoms include abdominal pain. Pertinent negatives include no chest pain and no shortness of breath.       Home Medications Prior to Admission medications   Medication Sig Start Date End Date Taking? Authorizing Provider  amLODipine (NORVASC) 10 MG tablet Take 1 tablet (10 mg total) by mouth daily. 04/22/22  Yes Burchette, Elberta Fortis, MD  apixaban (ELIQUIS) 2.5 MG TABS tablet Take 2.5 mg by mouth 2 (two) times daily. 08/21/22  Yes [provider]  levothyroxine (SYNTHROID) 75 MCG tablet Take 1 tablet (75 mcg total) by mouth daily. 04/22/22  Yes Burchette, Elberta Fortis, MD  losartan-hydrochlorothiazide (HYZAAR) 50-12.5 MG tablet TAKE 1 TABLET BY MOUTH EVERY DAY 08/21/22  Yes Burchette, Elberta Fortis, MD  Multiple Vitamins-Minerals (ADULT GUMMY PO) Take 2 tablets by mouth daily. Woman's Gummy   Yes [provider]  omeprazole (PRILOSEC) 20 MG capsule Take 1 capsule (20 mg total) by mouth daily. 04/22/22  Yes Burchette, Elberta Fortis, MD  pramipexole (MIRAPEX) 0.5 MG tablet TAKE  1 TABLET BY MOUTH AT BEDTIME. Patient not taking: Reported on 08/30/2022 12/24/21   Kristian Covey, MD      Allergies    Patient has no known allergies.    Review of Systems   Review of Systems  Constitutional:  Negative for fever.  Respiratory:  Negative for shortness of breath.   Cardiovascular:  Negative for chest pain.  Gastrointestinal:  Positive for abdominal pain, diarrhea and nausea.  Genitourinary:  Positive for flank  pain and hematuria.  All other systems reviewed and are negative.   Physical Exam Updated Vital Signs BP (!) 131/105   Pulse 82   Temp 97.6 F (36.4 C) (Oral)   Resp 18   SpO2 100%  Physical Exam Vitals and nursing note reviewed.  Constitutional:      General: She is not in acute distress.    Appearance: Normal appearance. She is not ill-appearing, toxic-appearing or diaphoretic.  HENT:     Head: Normocephalic and atraumatic.     Nose: Nose normal.     Mouth/Throat:     Mouth: Mucous membranes are moist.     Pharynx: Oropharynx is clear.  Eyes:     Extraocular Movements: Extraocular movements intact.     Conjunctiva/sclera: Conjunctivae normal.     Pupils: Pupils are equal, round, and reactive to light.  Cardiovascular:     Rate and Rhythm: Normal rate and regular rhythm.  Pulmonary:     Effort: Pulmonary effort is normal.     Breath sounds: Normal breath sounds. No wheezing.  Abdominal:     General: Abdomen is flat. Bowel sounds are normal.     Palpations: Abdomen is soft.     Tenderness: There is generalized abdominal tenderness. There is no right CVA tenderness or left CVA tenderness.     Comments: Generalized abdominal tenderness worse in suprapubic region. No rebound, guarding. No CVA tenderness. Ecchymosis noted to patient abdomen along flanks and periumbilically   Musculoskeletal:     Cervical back: Normal range of motion and neck supple. No tenderness.     Comments: Midline lumbar spinal tenderness, no step-off or crepitus  Skin:    General: Skin is warm and dry.     Capillary Refill: Capillary refill takes less than 2 seconds.     Comments: Dressings in place to surgical incision of patient right thigh. Ecchymosis to patient RLE.   Neurological:     General: No focal deficit present.     Mental Status: She is alert and oriented to person, place, and time.     GCS: GCS eye subscore is 4. GCS verbal subscore is 5. GCS motor subscore is 6.     Cranial Nerves:  Cranial nerves 2-12 are intact. No cranial nerve deficit.     Sensory: Sensation is intact.     Motor: Motor function is intact.     Coordination: Coordination is intact.     Comments: 5/5 strength bilateral lower extremities.      ED Results / Procedures / Treatments   Labs (all labs ordered are listed, but only abnormal results are displayed) Labs Reviewed  BASIC METABOLIC PANEL - Abnormal; Notable for the following components:      Result Value   Sodium 119 (*)    Chloride 92 (*)    CO2 15 (*)    BUN 140 (*)    Creatinine, Ser 8.60 (*)    Calcium 7.9 (*)    GFR, Estimated 4 (*)    All other  components within normal limits  URINALYSIS, ROUTINE W REFLEX MICROSCOPIC - Abnormal; Notable for the following components:   Color, Urine RED (*)    APPearance HAZY (*)    Hgb urine dipstick MODERATE (*)    Protein, ur 100 (*)    Nitrite POSITIVE (*)    Leukocytes,Ua TRACE (*)    All other components within normal limits  HEPATIC FUNCTION PANEL - Abnormal; Notable for the following components:   Albumin 3.0 (*)    Bilirubin, Direct 0.3 (*)    All other components within normal limits  CBC WITH DIFFERENTIAL/PLATELET - Abnormal; Notable for the following components:   WBC 13.6 (*)    RBC 2.65 (*)    Hemoglobin 7.1 (*)    HCT 22.5 (*)    RDW 17.3 (*)    Platelets 485 (*)    Neutro Abs 11.0 (*)    Abs Immature Granulocytes 0.11 (*)    All other components within normal limits  URINE CULTURE  LACTIC ACID, PLASMA  CBC WITH DIFFERENTIAL/PLATELET  LACTIC ACID, PLASMA  CK    EKG None  Radiology No results found.  Procedures .Critical Care  Performed by: Al Decant, PA-C Authorized by: Al Decant, PA-C   Critical care provider statement:    Critical care time (minutes):  75   Critical care time was exclusive of:  Separately billable procedures and treating other patients   Critical care was necessary to treat or prevent imminent or life-threatening  deterioration of the following conditions:  Renal failure and metabolic crisis   Critical care was time spent personally by me on the following activities:  Blood draw for specimens, development of treatment plan with patient or surrogate, discussions with consultants, evaluation of patient's response to treatment, discussions with primary provider, examination of patient, interpretation of cardiac output measurements, obtaining history from patient or surrogate, vascular access procedures, review of old charts, re-evaluation of patient's condition, pulse oximetry, ordering and review of radiographic studies, ordering and review of laboratory studies and ordering and performing treatments and interventions   I assumed direction of critical care for this patient from another provider in my specialty: no     Care discussed with: admitting provider       Medications Ordered in ED Medications  fentaNYL (SUBLIMAZE) injection 50 mcg (50 mcg Intravenous Given 08/30/22 1328)  sodium chloride 0.9 % bolus 1,000 mL (0 mLs Intravenous Stopped 08/30/22 1559)  cefTRIAXone (ROCEPHIN) 1 g in sodium chloride 0.9 % 100 mL IVPB (0 g Intravenous Stopped 08/30/22 1615)    ED Course/ Medical Decision Making/ A&P  Medical Decision Making Amount and/or Complexity of Data Reviewed Labs: ordered. Radiology: ordered.  Risk Prescription drug management. Decision regarding hospitalization.   77 year old female presents to the ED for evaluation.  Please see HPI for further details.  On examination patient is afebrile, nontachycardic.  Lung sounds are clear bilaterally, she is not hypoxic.  Her abdomen is soft and compressible without CVA tenderness bilaterally however she does have generalized abdominal discomfort worse in suprapubic region.  She does have midline lumbar spinal tenderness.  Her surgical incision on her right thigh has a dressing in place. There is no strikethrough. She has 5/5 strength to bilateral lower  extremities. Intact sensation. She is alert and oriented.  Nursing staff was able to catheterize >1500 mL of dark red urine. Patient reports feeling immediate relief of abdominal pain after catheter inserted.  CBC pending at this time.  Basic metabolic panel shows sodium  119, potassium 5.1, BUN 140, creatinine 8.6.  Patient creatinine 1 month ago was 1.3.  The patient is in acute renal failure most likely secondary to urinary retention. There was mention of this patient having "AKI on CKD" from her discharge summary dated 5/2 at outside hospital in [facility] however I do not have access to these records, am unsure patient creatinine at discharge. Anion gap of 12.  Hepatic function panel shows albumin 3.  Lactic acid 0.9.  Urinalysis shows nitrite positive urine with leukocytes.  We will culture the patient urine, will give her 1 g ceftriaxone.   Patient will need admission.  Nephrology has been paged.  Patient CT abdomen pelvis without contrast pending at this time.  Patient chest x-ray pending at this time.   Patient given a liter fluid for hyponatremia.  Patient alert and oriented x 4.  At end of shift, this patient has been discussed with critical care.  Dr. Vassie Loll will assess the patient to determine whether or not he would like to admit her to his service or have hospitalist admit with CCM following along.  I have also paged nephrology, am awaiting callback.  Patient signed out to oncoming provider Britni Henderly, PA-C.  Plan of management discussed with oncoming provider.  Patient stable.   Final Clinical Impression(s) / ED Diagnoses Final diagnoses:  Acute renal failure, unspecified acute renal failure type (HCC)  Hyponatremia  Urinary retention  Acute cystitis with hematuria    Rx / DC Orders ED Discharge Orders     None                 Al Decant, PA-C 08/30/22 1656    Wynetta Fines, MD 08/31/22 [phone]

## 2022-08-30 NOTE — ED Notes (Signed)
ED TO INPATIENT HANDOFF REPORT  ED Nurse Name and Phone #: Amie Critchley Name/Age/Gender Marcelyn Bruins 77 y.o. female Room/Bed: WA01/WA01  Code Status   Code Status: Full Code  Home/SNF/Other Home Patient oriented to: self, place, time, and situation Is this baseline? Yes   Triage Complete: Triage complete  Chief Complaint AKI (acute kidney injury) (HCC) [N17.9]  Triage Note Pt BIB EMS from home, c/o blood in urine. Onset x 2 days. Pt newly placed on blood thinner (eliquis) in April related to femur surgery on April 29. Denies pain, nausea/vomit.  BP 150/40 P 76 Spo2 100%   Allergies No Known Allergies  Level of Care/Admitting Diagnosis ED Disposition     ED Disposition  Admit   Condition  --   Comment  Hospital Area: [facility] [[id number]]  Level of Care: ICU [6]  May admit patient to [facility] or [facility] if equivalent level of care is available:: Yes  Covid Evaluation: Asymptomatic - no recent exposure (last 10 days) testing not required  Diagnosis: AKI (acute kidney injury) [facility]) [[id number]]  Admitting Physician: Oretha Milch [[id number]]  Attending Physician: Oretha Milch [[id number]]  Certification:: I certify this patient will need inpatient services for at least 2 midnights  Estimated Length of Stay: 4          B Medical/Surgery History 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   Diffuse large B cell lymphoma (HCC) dx'd 02/17/17   in remission 2 years now   had chemo    GERD (gastroesophageal reflux disease)    Heart murmur    07/03/21 echo: LVEF 60-65%, grade I DD, normal PASP, trivial MR, mild AI   History of radiation therapy 07/12/10,completed   right breast 60 Gy x30 fx   Hypertension    no  meds currently   Hypothyroidism    Lymphoma (HCC)    Obesity    PE (pulmonary thromboembolism) (HCC) 01/2017   in setting of chemotherapy for lymphoma   Peripheral vascular disease (HCC) 1995   PT DEVELOPED CIRCULATION PROBLEMS IN BOTH HANDS AND GANGRENE OF BOTH INDEX FINGERS--REQUIRING AMPUTATION OF THE INDEX FINGERS AND VASCULAR SURGERY.  PT TOLD HER PROBLEMS RELATED TO SMOKING.   NO OTHER PROBLEMS SINCE.   Personal history of radiation therapy 2012   Right Breast Cancer   Pneumonia    Thyroid mass    Vocal cord paralysis    left   Past Surgical History:  Procedure Laterality Date   AMPUTATION     partial amputation of both index fingers   BREAST LUMPECTOMY Right 04/01/2010   BREAST SURGERY  2011   lumpectomy with node sampling- RIGHT   CATARACT EXTRACTION, BILATERAL  approx 2017   COLONOSCOPY  06/17/2022   per Dr. Adela Lank, one adenomatous polyp, no repeats needed   ESOPHAGOGASTRODUODENOSCOPY     EXCISION MASS NECK Left 12/26/2016   Procedure: EXCISION MASS NECK;  Surgeon: Serena Colonel, MD;  Location: [facility] OR;  Service: ENT;  Laterality: Left;  open excision of thyroid mass left side with frozen section   HAND SURGERY Bilateral 1995   Amputaed pointer fingers bilaterally   IR FLUORO GUIDE PORT INSERTION RIGHT  01/14/2017   IR GASTROSTOMY TUBE MOD  SED  02/19/2017   IR GASTROSTOMY TUBE REMOVAL  12/07/2018   IR PATIENT EVAL TECH 0-60 MINS  01/14/2019   IR REMOVAL TUN ACCESS W/ PORT W/O FL MOD SED  11/30/2017   IR REPLACE G-TUBE SIMPLE WO FLUORO  08/03/2017   IR REPLACE G-TUBE SIMPLE WO FLUORO  06/10/2018   IR REPLC GASTRO/COLONIC TUBE PERCUT W/FLUORO  02/25/2017   IR REPLC GASTRO/COLONIC TUBE PERCUT W/FLUORO  03/09/2017   IR US GUIDE BX ASP/DRAIN  12/07/2018   IR US GUIDE VASC ACCESS RIGHT  01/14/2017   JOINT REPLACEMENT     LAPAROSCOPIC GASTRIC RESECTION N/A 09/09/2019   Procedure: LAPAROSCOPIC CLOSURE OF GASTRO-CUTANEOUS FISTULA, EXCISION OF FISTUAL TRACK;  Surgeon:  Ovidio Kin, MD;  Location: [facility];  Service: General;  Laterality: N/A;   LUMBAR LAMINECTOMY/DECOMPRESSION MICRODISCECTOMY N/A 01/30/2022   Procedure: Microdiscetomy hemilaminectomy decompression Lumbar Five-Sacral One;  Surgeon: Jene Every, MD;  Location: [facility];  Service: Orthopedics;  Laterality: N/A;  90 mins 3 C-Bed   TONSILLECTOMY     TOTAL HIP ARTHROPLASTY  12/16/2011   right hip   TOTAL HIP ARTHROPLASTY  01/20/2012   Procedure: TOTAL HIP ARTHROPLASTY ANTERIOR APPROACH;  Surgeon: Shelda Pal, MD;  Location: [facility];  Service: Orthopedics;  Laterality: Left;   TOTAL KNEE ARTHROPLASTY Right 05/31/2018   Procedure: TOTAL KNEE ARTHROPLASTY;  Surgeon: Ollen Gross, MD;  Location: [facility];  Service: Orthopedics;  Laterality: Right;  Adductor Block   TUBAL LIGATION     VASCULAR SURGERY     both hands     A IV Location/Drains/Wounds Patient Lines/Drains/Airways Status     Active Line/Drains/Airways     Name Placement date Placement time Site Days   Peripheral IV 08/30/22 Left Antecubital 08/30/22  1310  Antecubital  less than 1   Urethral Catheter Shamariah Shewmake, RN Non-latex 14 Fr. 08/30/22  1503  Non-latex  less than 1   Incision - 3 Ports Abdomen 1: Right;Lower 2: Right;Upper 3: Left;Lower 09/09/19  0821  -- 1086            Intake/Output Last 24 hours  Intake/Output Summary (Last 24 hours) at 08/30/2022 1704 Last data filed at 08/30/2022 1615 Gross per 24 hour  Intake 1198.9 ml  Output 1550 ml  Net -351.1 ml    Labs/Imaging Results for orders placed or performed during the hospital encounter of 08/30/22 (from the past 48 hour(s))  Basic metabolic panel     Status: Abnormal   Collection Time: 08/30/22  1:30 PM  Result Value Ref Range   Sodium 119 (LL) 135 - 145 mmol/L    Comment: CRITICAL RESULT CALLED TO, READ BACK BY AND VERIFIED WITH SAVOIE, B. RN AT 1446 ON 08/30/2022 BY MECIAL J.    Potassium 5.1 3.5 - 5.1 mmol/L   Chloride 92 (L) 98 - 111 mmol/L   CO2 15  (L) 22 - 32 mmol/L   Glucose, Bld 96 70 - 99 mg/dL    Comment: Glucose reference range applies only to samples taken after fasting for at least 8 hours.   BUN 140 (H) 8 - 23 mg/dL    Comment: RESULT CONFIRMED BY MANUAL DILUTION   Creatinine, Ser 8.60 (H) 0.44 - 1.00 mg/dL   Calcium 7.9 (L) 8.9 - 10.3 mg/dL   GFR, Estimated 4 (L) >60 mL/min    Comment: (NOTE) Calculated using the CKD-EPI Creatinine Equation (2021)    Anion gap 12 5 - 15    Comment: Performed at [facility],  [address] [place], [place] [address]  Hepatic function panel     Status: Abnormal   Collection Time: 08/30/22  1:30 PM  Result Value Ref Range   Total Protein 7.1 6.5 - 8.1 g/dL   Albumin 3.0 (L) 3.5 - 5.0 g/dL   AST 33 15 - 41 U/L   ALT 6 0 - 44 U/L   Alkaline Phosphatase 65 38 - 126 U/L   Total Bilirubin 0.9 0.3 - 1.2 mg/dL   Bilirubin, Direct 0.3 (H) 0.0 - 0.2 mg/dL   Indirect Bilirubin 0.6 0.3 - 0.9 mg/dL    Comment: Performed at [facility], [address], [place], [place] [address]  Lactic acid, plasma     Status: None   Collection Time: 08/30/22  1:30 PM  Result Value Ref Range   Lactic Acid, Venous 0.9 0.5 - 1.9 mmol/L    Comment: Performed at [facility], [address], [place], [place] [address]  Urinalysis, Routine w reflex microscopic -Urine, Clean Catch     Status: Abnormal   Collection Time: 08/30/22  2:00 PM  Result Value Ref Range   Color, Urine RED (A) YELLOW    Comment: BIOCHEMICALS MAY BE AFFECTED BY COLOR   APPearance HAZY (A) CLEAR   Specific Gravity, Urine 1.013 1.005 - 1.030   pH 6.0 5.0 - 8.0   Glucose, UA NEGATIVE NEGATIVE mg/dL   Hgb urine dipstick MODERATE (A) NEGATIVE   Bilirubin Urine NEGATIVE NEGATIVE   Ketones, ur NEGATIVE NEGATIVE mg/dL   Protein, ur 295 (A) NEGATIVE mg/dL   Nitrite POSITIVE (A) NEGATIVE   Leukocytes,Ua TRACE (A) NEGATIVE   RBC / HPF >50 0 - 5 RBC/hpf   WBC, UA >50 0 - 5 WBC/hpf    Bacteria, UA NONE SEEN NONE SEEN   Squamous Epithelial / HPF 0-5 0 - 5 /HPF    Comment: Performed at [facility], [address] [place], [place] [address]  CBC with Differential/Platelet     Status: Abnormal   Collection Time: 08/30/22  4:04 PM  Result Value Ref Range   WBC 13.6 (H) 4.0 - 10.5 K/uL   RBC 2.65 (L) 3.87 - 5.11 MIL/uL   Hemoglobin 7.1 (L) 12.0 - 15.0 g/dL   HCT 86.5 (L) 78.4 - 69.6 %   MCV 84.9 80.0 - 100.0 fL   MCH 26.8 26.0 - 34.0 pg   MCHC 31.6 30.0 - 36.0 g/dL   RDW 29.5 (H) 28.4 - 13.2 %   Platelets 485 (H) 150 - 400 K/uL   nRBC 0.0 0.0 - 0.2 %   Neutrophils Relative % 81 %   Neutro Abs 11.0 (H) 1.7 - 7.7 K/uL   Lymphocytes Relative 9 %   Lymphs Abs 1.3 0.7 - 4.0 K/uL   Monocytes Relative 6 %   Monocytes Absolute 0.8 0.1 - 1.0 K/uL   Eosinophils Relative 3 %   Eosinophils Absolute 0.4 0.0 - 0.5 K/uL   Basophils Relative 0 %   Basophils Absolute 0.1 0.0 - 0.1 K/uL   Immature Granulocytes 1 %   Abs Immature Granulocytes 0.11 (H) 0.00 - 0.07 K/uL    Comment: Performed at [facility], [address], [place], [place] [address]   DG Chest 2 View  Result Date: 08/30/2022 CLINICAL DATA:  Admission EXAM: CHEST - 2 VIEW COMPARISON:  Radiograph 03/05/2017, CT 11/16/2019 FINDINGS: Unchanged cardiomediastinal silhouette. There is no focal airspace consolidation. There is no large effusion or  evidence of pneumothorax. Unchanged emphysema. Unchanged reticulation in the right apex. Axillary surgical clips. Thoracic spondylosis. Left shoulder degenerative changes. Mild thoracic spondylosis. IMPRESSION: No evidence of acute cardiopulmonary disease. 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 Electronically Signed   By: Caprice Renshaw M.D.   On: 08/30/2022 16:48   CT ABDOMEN PELVIS WO CONTRAST  Result Date: 08/30/2022 CLINICAL DATA:  Acute abdominal pain EXAM: CT ABDOMEN AND PELVIS WITHOUT CONTRAST TECHNIQUE:  Multidetector CT imaging of the abdomen and pelvis was performed following the standard protocol without IV contrast. RADIATION DOSE REDUCTION: This exam was performed according to the departmental dose-optimization program which includes automated exposure control, adjustment of the mA and/or kV according to patient size and/or use of iterative reconstruction technique. COMPARISON:  None Available. FINDINGS: Lower chest: No acute abnormality. Hepatobiliary: Gallstones are present. There is no biliary ductal dilatation. The liver is within normal limits. Pancreas: Unremarkable. No pancreatic ductal dilatation or surrounding inflammatory changes. Spleen: Normal in size without focal abnormality. Adrenals/Urinary Tract: There is mild bilateral hydroureteronephrosis to the level of the bladder. No obstructing calculi are seen. There is renal cortical scarring bilaterally. The adrenal glands are within normal limits. The bladder is distended. There is a small focus of air in the bladder. There is mild inflammatory stranding and wall thickening of the anterior wall of the bladder. There is heterogeneous hyperdensity in the anterior right bladder measuring proximally 3.0 x 7.0 by 3.0 cm. Stomach/Bowel: Small hiatal hernia is present. Stomach is otherwise within normal limits. Appendix appears normal. No evidence of bowel wall thickening, distention, or inflammatory changes. There is sigmoid colon diverticulosis. Vascular/Lymphatic: Aortic atherosclerosis. No enlarged abdominal or pelvic lymph nodes. Reproductive: Uterus and bilateral adnexa are unremarkable. Other: No abdominal wall hernia or abnormality. No abdominopelvic ascites. Musculoskeletal: Degenerative changes affect the spine. There is nonspecific subcutaneous lumbar edema. Bilateral hip arthroplasties are present. IMPRESSION: 1. Bladder wall thickening with surrounding inflammation worrisome for cystitis. 2. Heterogeneous hyperdensity in the anterior right  bladder may represent hematoma or mass. Recommend further evaluation with cystoscopy. 3. Small focus of air in the bladder may be related to recent instrumentation. 4. Mild bilateral hydroureteronephrosis to the level of the bladder. No obstructing calculi are seen. 5. Cholelithiasis. Aortic Atherosclerosis (ICD10-I70.0). Electronically Signed   By: Darliss Cheney M.D.   On: 08/30/2022 16:46    Pending Labs Unresulted Labs (From admission, onward)     Start     Ordered   08/31/22 0500  CBC  Tomorrow morning,   R        08/30/22 1644   08/31/22 0500  Basic metabolic panel  Tomorrow morning,   R        08/30/22 1644   08/30/22 1644  CBC  Once,   R        08/30/22 1644   08/30/22 1643  Basic metabolic panel  Now then every 8 hours,   R      08/30/22 1644   08/30/22 1521  Urine Culture  (Urine Culture)  Add-on,   AD       Question:  Indication  Answer:  Acute gross hematuria   08/30/22 1520   08/30/22 1454  CK  Add-on,   AD        08/30/22 1453   08/30/22 1305  Lactic acid, plasma  Now then every 2 hours,   R      08/30/22 1304   08/30/22 1239  CBC with Differential  Once,   STAT  08/30/22 1239            Vitals/Pain Today's Vitals   08/30/22 1500 08/30/22 1517 08/30/22 1545 08/30/22 1600  BP: 99/63   (!) 131/105  Pulse: 76  82 82  Resp: 17   18  Temp:  97.6 F (36.4 C)    TempSrc:  Oral    SpO2: 98%  100% 100%  PainSc:        Isolation Precautions No active isolations  Medications Medications  docusate sodium (COLACE) capsule 100 mg (has no administration in time range)  polyethylene glycol (MIRALAX / GLYCOLAX) packet 17 g (has no administration in time range)  lactated ringers infusion (has no administration in time range)  ondansetron (ZOFRAN) injection 4 mg (has no administration in time range)  cefTRIAXone (ROCEPHIN) 1 g in sodium chloride 0.9 % 100 mL IVPB (has no administration in time range)  amLODipine (NORVASC) tablet 10 mg (has no administration in  time range)  levothyroxine (SYNTHROID) tablet 75 mcg (has no administration in time range)  fentaNYL (SUBLIMAZE) injection 50 mcg (50 mcg Intravenous Given 08/30/22 1328)  sodium chloride 0.9 % bolus 1,000 mL (0 mLs Intravenous Stopped 08/30/22 1559)  cefTRIAXone (ROCEPHIN) 1 g in sodium chloride 0.9 % 100 mL IVPB (0 g Intravenous Stopped 08/30/22 1615)    Mobility non-ambulatory     Focused Assessments    R Recommendations: See Admitting Provider Note  Report given to:   Additional Notes: N/A

## 2022-08-30 NOTE — Consult Note (Signed)
Urology Consult   Physician requesting consult: Cyril Mourning, MD  Reason for consult: hematuria  History of Present Illness: Kristen Escobar is a 77 y.o. female with a PMH of CKD, GERD, DLBCL, breast cancer, HTN, PE, and recent femur fracture s/p ORIF on 08/19/22 who presented with gross hematuria and acute renal failure. Urology was consulted for evaluation and recommendations.  Patient was discharged following ORIF on Eliquis given hx of PE. She reports she has had difficulties with post-op urinary retention in the past and previously required indwelling catheter for 5-7 days. She notes difficulty with urinating began a few days after discharge from her procedure. Over the last couple of days, she cannot recall voiding any more than a few drops at a time. Her husband notes that 1-2 days ago he noted a "brownish" discoloration to her urine on her pads, but thinks it could've also been stool. Shortly after he noticed this, they both noted blood in the urine during a small volume void. [name] then realized she was having difficulty peeing and had suprapubic tenderness. She presented to the ED for further evaluation. On arrival, patient was noted to be in acute renal failure and urinary retention. An I/O cath returned ~1.5L bloody urine. A 14Fr foley catheter was then placed. A CT A/P was obtained that was remarkable for bladder wall thickening, heterogeneous hyperdensity in the anterior right bladder c/f hematoma vs mass, and mild bilateral hydro. A subsequent RUS redemonstrated high density tissue along the right margin of the bladder and no hydronephrosis.  Urology evaluated patient and attempted manual irrigation via 14Fr foley. There was significant resistance to irrigation and minimal yield of dark urine with small clots. Her catheter was upsized to a 22Fr hematuria catheter in anticipation of needing CBI. Catheter was then manually irrigated with ~2L NS with return of ~200cc old dark clot and urine  cleared to a light pink color. CBI was not initiated.  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   Diffuse large B cell lymphoma (HCC) dx'd 02/17/17   in remission 2 years now   had chemo    GERD (gastroesophageal reflux disease)    Heart murmur    07/03/21 echo: LVEF 60-65%, grade I DD, normal PASP, trivial MR, mild AI   History of radiation therapy 07/12/10,completed   right breast 60 Gy x30 fx   Hypertension    no meds currently   Hypothyroidism    Lymphoma (HCC)    Obesity    PE (pulmonary thromboembolism) (HCC) 01/2017   in setting of chemotherapy for lymphoma   Peripheral vascular disease (HCC) 1995   PT DEVELOPED CIRCULATION PROBLEMS IN BOTH HANDS AND GANGRENE OF BOTH INDEX FINGERS--REQUIRING AMPUTATION OF THE INDEX FINGERS AND VASCULAR SURGERY.  PT TOLD HER PROBLEMS RELATED TO SMOKING.   NO OTHER PROBLEMS SINCE.   Personal history of radiation therapy 2012   Right Breast Cancer   Pneumonia    Thyroid mass    Vocal cord paralysis    left    Past Surgical History:  Procedure Laterality Date   AMPUTATION     partial amputation of both index fingers   BREAST LUMPECTOMY Right 04/01/2010   BREAST SURGERY  2011   lumpectomy with node sampling-  RIGHT   CATARACT EXTRACTION, BILATERAL  approx 2017   COLONOSCOPY  06/17/2022   per Dr. Adela Lank, one adenomatous polyp, no repeats needed   ESOPHAGOGASTRODUODENOSCOPY     EXCISION MASS NECK Left 12/26/2016   Procedure: EXCISION MASS NECK;  Surgeon: Serena Colonel, MD;  Location: [facility] OR;  Service: ENT;  Laterality: Left;  open excision of thyroid mass left side with frozen section   HAND SURGERY Bilateral 1995   Amputaed pointer fingers bilaterally   IR FLUORO GUIDE PORT INSERTION RIGHT  01/14/2017   IR  GASTROSTOMY TUBE MOD SED  02/19/2017   IR GASTROSTOMY TUBE REMOVAL  12/07/2018   IR PATIENT EVAL TECH 0-60 MINS  01/14/2019   IR REMOVAL TUN ACCESS W/ PORT W/O FL MOD SED  11/30/2017   IR REPLACE G-TUBE SIMPLE WO FLUORO  08/03/2017   IR REPLACE G-TUBE SIMPLE WO FLUORO  06/10/2018   IR REPLC GASTRO/COLONIC TUBE PERCUT W/FLUORO  02/25/2017   IR REPLC GASTRO/COLONIC TUBE PERCUT W/FLUORO  03/09/2017   IR US GUIDE BX ASP/DRAIN  12/07/2018   IR US GUIDE VASC ACCESS RIGHT  01/14/2017   JOINT REPLACEMENT     LAPAROSCOPIC GASTRIC RESECTION N/A 09/09/2019   Procedure: LAPAROSCOPIC CLOSURE OF GASTRO-CUTANEOUS FISTULA, EXCISION OF FISTUAL TRACK;  Surgeon: Ovidio Kin, MD;  Location: [facility] ORS;  Service: General;  Laterality: N/A;   LUMBAR LAMINECTOMY/DECOMPRESSION MICRODISCECTOMY N/A 01/30/2022   Procedure: Microdiscetomy hemilaminectomy decompression Lumbar Five-Sacral One;  Surgeon: Jene Every, MD;  Location: [facility] OR;  Service: Orthopedics;  Laterality: N/A;  90 mins 3 C-Bed   TONSILLECTOMY     TOTAL HIP ARTHROPLASTY  12/16/2011   right hip   TOTAL HIP ARTHROPLASTY  01/20/2012   Procedure: TOTAL HIP ARTHROPLASTY ANTERIOR APPROACH;  Surgeon: Shelda Pal, MD;  Location: [facility] ORS;  Service: Orthopedics;  Laterality: Left;   TOTAL KNEE ARTHROPLASTY Right 05/31/2018   Procedure: TOTAL KNEE ARTHROPLASTY;  Surgeon: Ollen Gross, MD;  Location: [facility] ORS;  Service: Orthopedics;  Laterality: Right;  Adductor Block   TUBAL LIGATION     VASCULAR SURGERY     both hands     Current Hospital Medications:  Home meds:  No current facility-administered medications on file prior to encounter.   Current Outpatient Medications on File Prior to Encounter  Medication Sig Dispense Refill   amLODipine (NORVASC) 10 MG tablet Take 1 tablet (10 mg total) by mouth daily. 90 tablet 3   apixaban (ELIQUIS) 2.5 MG TABS tablet Take 2.5 mg by mouth 2 (two) times daily.     levothyroxine (SYNTHROID) 75 MCG tablet Take 1  tablet (75 mcg total) by mouth daily. 90 tablet 3   losartan-hydrochlorothiazide (HYZAAR) 50-12.5 MG tablet TAKE 1 TABLET BY MOUTH EVERY DAY 90 tablet 2   Multiple Vitamins-Minerals (ADULT GUMMY PO) Take 2 tablets by mouth daily. Woman's Gummy     omeprazole (PRILOSEC) 20 MG capsule Take 1 capsule (20 mg total) by mouth daily. 90 capsule 3   pramipexole (MIRAPEX) 0.5 MG tablet TAKE 1 TABLET BY MOUTH AT BEDTIME. (Patient not taking: Reported on 08/30/2022) 90 tablet 0     Scheduled Meds:  sodium chloride   Intravenous Once   [START ON 08/31/2022] amLODipine  10 mg Oral Daily   Chlorhexidine Gluconate Cloth  6 each Topical Daily   [START ON 08/31/2022] levothyroxine  75 mcg Oral Daily   Continuous Infusions:  [START ON 08/31/2022] cefTRIAXone (ROCEPHIN)  IV     lactated ringers 150 mL/hr at 08/30/22 1801  PRN Meds:.docusate sodium, ondansetron (ZOFRAN) IV, mouth rinse, polyethylene glycol  Allergies: No Known Allergies  Family History  Problem Relation Age of Onset   Cancer Mother        pt unaware of what kind   Hearing loss Mother    Coronary artery disease Father    Diabetes Father    Diabetes Sister    Breast cancer Sister    Coronary artery disease Brother    Coronary artery disease Brother    Colon cancer Neg Hx    Stomach cancer Neg Hx    Rectal cancer Neg Hx    Esophageal cancer Neg Hx     Social History:  reports that she quit smoking about 29 years ago. Her smoking use included cigarettes. She has a 20.00 pack-year smoking history. She has never used smokeless tobacco. She reports that she does not currently use alcohol. She reports that she does not use drugs.  ROS: A complete review of systems was performed.  All systems are negative except for pertinent findings as noted.  Physical Exam:  Vital signs in last 24 hours: Temp:  [97.6 F (36.4 C)-98 F (36.7 C)] 97.7 F (36.5 C) (05/11 2015) Pulse Rate:  [74-84] 79 (05/11 2100) Resp:  [17-19] 19 (05/11  2100) BP: (94-144)/(50-105) 114/76 (05/11 2100) SpO2:  [98 %-100 %] 99 % (05/11 2100) Constitutional:  Alert and oriented, No acute distress Cardiovascular: Regular rate and rhythm, No JVD Respiratory: Normal respiratory effort, Lungs clear bilaterally GI: Abdomen is soft, nontender, nondistended, no abdominal masses GU: 22Fr hematuria catheter in place, 3rd port capped; urine is light pink Lymphatic: No lymphadenopathy Neurologic: Grossly intact, no focal deficits Psychiatric: Normal mood and affect  Laboratory Data:  Recent Labs    08/30/22 1604 08/30/22 1710  WBC 13.6* 12.7*  HGB 7.1* 6.4*  HCT 22.5* 19.7*  PLT 485* 379    Recent Labs    08/30/22 1330 08/30/22 1710  NA 119* 129*  K 5.1 4.5  CL 92* 99  GLUCOSE 96 88  BUN 140* 112*  CALCIUM 7.9* 7.9*  CREATININE 8.60* 6.74*     Results for orders placed or performed during the hospital encounter of 08/30/22 (from the past 24 hour(s))  Basic metabolic panel     Status: Abnormal   Collection Time: 08/30/22  1:30 PM  Result Value Ref Range   Sodium 119 (LL) 135 - 145 mmol/L   Potassium 5.1 3.5 - 5.1 mmol/L   Chloride 92 (L) 98 - 111 mmol/L   CO2 15 (L) 22 - 32 mmol/L   Glucose, Bld 96 70 - 99 mg/dL   BUN 161 (H) 8 - 23 mg/dL   Creatinine, Ser 0.96 (H) 0.44 - 1.00 mg/dL   Calcium 7.9 (L) 8.9 - 10.3 mg/dL   GFR, Estimated 4 (L) >60 mL/min   Anion gap 12 5 - 15  Hepatic function panel     Status: Abnormal   Collection Time: 08/30/22  1:30 PM  Result Value Ref Range   Total Protein 7.1 6.5 - 8.1 g/dL   Albumin 3.0 (L) 3.5 - 5.0 g/dL   AST 33 15 - 41 U/L   ALT 6 0 - 44 U/L   Alkaline Phosphatase 65 38 - 126 U/L   Total Bilirubin 0.9 0.3 - 1.2 mg/dL   Bilirubin, Direct 0.3 (H) 0.0 - 0.2 mg/dL   Indirect Bilirubin 0.6 0.3 - 0.9 mg/dL  Lactic acid, plasma     Status: None   Collection  Time: 08/30/22  1:30 PM  Result Value Ref Range   Lactic Acid, Venous 0.9 0.5 - 1.9 mmol/L  Urinalysis, Routine w reflex  microscopic -Urine, Clean Catch     Status: Abnormal   Collection Time: 08/30/22  2:00 PM  Result Value Ref Range   Color, Urine RED (A) YELLOW   APPearance HAZY (A) CLEAR   Specific Gravity, Urine 1.013 1.005 - 1.030   pH 6.0 5.0 - 8.0   Glucose, UA NEGATIVE NEGATIVE mg/dL   Hgb urine dipstick MODERATE (A) NEGATIVE   Bilirubin Urine NEGATIVE NEGATIVE   Ketones, ur NEGATIVE NEGATIVE mg/dL   Protein, ur 409 (A) NEGATIVE mg/dL   Nitrite POSITIVE (A) NEGATIVE   Leukocytes,Ua TRACE (A) NEGATIVE   RBC / HPF >50 0 - 5 RBC/hpf   WBC, UA >50 0 - 5 WBC/hpf   Bacteria, UA NONE SEEN NONE SEEN   Squamous Epithelial / HPF 0-5 0 - 5 /HPF  CBC with Differential/Platelet     Status: Abnormal   Collection Time: 08/30/22  4:04 PM  Result Value Ref Range   WBC 13.6 (H) 4.0 - 10.5 K/uL   RBC 2.65 (L) 3.87 - 5.11 MIL/uL   Hemoglobin 7.1 (L) 12.0 - 15.0 g/dL   HCT 81.1 (L) 91.4 - 78.2 %   MCV 84.9 80.0 - 100.0 fL   MCH 26.8 26.0 - 34.0 pg   MCHC 31.6 30.0 - 36.0 g/dL   RDW 95.6 (H) 21.3 - 08.6 %   Platelets 485 (H) 150 - 400 K/uL   nRBC 0.0 0.0 - 0.2 %   Neutrophils Relative % 81 %   Neutro Abs 11.0 (H) 1.7 - 7.7 K/uL   Lymphocytes Relative 9 %   Lymphs Abs 1.3 0.7 - 4.0 K/uL   Monocytes Relative 6 %   Monocytes Absolute 0.8 0.1 - 1.0 K/uL   Eosinophils Relative 3 %   Eosinophils Absolute 0.4 0.0 - 0.5 K/uL   Basophils Relative 0 %   Basophils Absolute 0.1 0.0 - 0.1 K/uL   Immature Granulocytes 1 %   Abs Immature Granulocytes 0.11 (H) 0.00 - 0.07 K/uL  Lactic acid, plasma     Status: None   Collection Time: 08/30/22  5:10 PM  Result Value Ref Range   Lactic Acid, Venous 0.9 0.5 - 1.9 mmol/L  CK     Status: None   Collection Time: 08/30/22  5:10 PM  Result Value Ref Range   Total CK 72 38 - 234 U/L  Basic metabolic panel     Status: Abnormal   Collection Time: 08/30/22  5:10 PM  Result Value Ref Range   Sodium 129 (L) 135 - 145 mmol/L   Potassium 4.5 3.5 - 5.1 mmol/L   Chloride 99 98  - 111 mmol/L   CO2 16 (L) 22 - 32 mmol/L   Glucose, Bld 88 70 - 99 mg/dL   BUN 578 (H) 8 - 23 mg/dL   Creatinine, Ser 4.69 (H) 0.44 - 1.00 mg/dL   Calcium 7.9 (L) 8.9 - 10.3 mg/dL   GFR, Estimated 6 (L) >60 mL/min   Anion gap 14 5 - 15  CBC     Status: Abnormal   Collection Time: 08/30/22  5:10 PM  Result Value Ref Range   WBC 12.7 (H) 4.0 - 10.5 K/uL   RBC 2.33 (L) 3.87 - 5.11 MIL/uL   Hemoglobin 6.4 (LL) 12.0 - 15.0 g/dL   HCT 62.9 (L) 52.8 - 41.3 %  MCV 84.5 80.0 - 100.0 fL   MCH 27.5 26.0 - 34.0 pg   MCHC 32.5 30.0 - 36.0 g/dL   RDW 40.9 (H) 81.1 - 91.4 %   Platelets 379 150 - 400 K/uL   nRBC 0.0 0.0 - 0.2 %  MRSA Next Gen by PCR, Nasal     Status: None   Collection Time: 08/30/22  5:46 PM   Specimen: Nasal Mucosa; Nasal Swab  Result Value Ref Range   MRSA by PCR Next Gen NOT DETECTED NOT DETECTED  Type and screen  [facility]     Status: None (Preliminary result)   Collection Time: 08/30/22  6:12 PM  Result Value Ref Range   ABO/RH(D) A POS    Antibody Screen NEG    Sample Expiration 09/02/2022,2359    Unit Number [id number]    Blood Component Type RED CELLS,LR    Unit division 00    Status of Unit ISSUED    Transfusion Status OK TO TRANSFUSE    Crossmatch Result      Compatible Performed at [facility], [address], [place], [place] [address]   Prepare RBC (crossmatch)     Status: None   Collection Time: 08/30/22  6:12 PM  Result Value Ref Range   Order Confirmation      ORDER PROCESSED BY BLOOD BANK Performed at [facility], [address], [place], [place] [address]    Recent Results (from the past 240 hour(s))  MRSA Next Gen by PCR, Nasal     Status: None   Collection Time: 08/30/22  5:46 PM   Specimen: Nasal Mucosa; Nasal Swab  Result Value Ref Range Status   MRSA by PCR Next Gen NOT DETECTED NOT DETECTED Final    Comment: (NOTE) The GeneXpert MRSA Assay (FDA approved for NASAL specimens  only), is one component of a comprehensive MRSA colonization surveillance program. It is not intended to diagnose MRSA infection nor to guide or monitor treatment for MRSA infections. Test performance is not FDA approved in patients less than 13 years old. Performed at [facility], [address], [place], [place] [address]     Renal Function: Recent Labs    08/30/22 1330 08/30/22 1710  CREATININE 8.60* 6.74*   CrCl cannot be calculated (Unknown ideal weight.).  Radiologic Imaging: US RENAL  Result Date: 08/30/2022 CLINICAL DATA:  Acute renal injury EXAM: RENAL / URINARY TRACT ULTRASOUND COMPLETE COMPARISON:  None Available. FINDINGS: Right Kidney: Renal measurements: 11.3 x 5.8 x 5.1 cm = volume: 174 mL. Echogenicity within normal limits. No mass or hydronephrosis visualized. Left Kidney: Renal measurements: 11.1 x 4.9 x 5.1 cm = volume: 145.7 mL. Echogenicity within normal limits. No mass or hydronephrosis visualized. Bladder: High-density material along the RIGHT wall the bladder measuring 3.8 x 2.9 x 4.4 cm and corresponds to high-density material on comparison CT. Other: None. IMPRESSION: 1. Normal kidneys. 2. High-density tissue along the RIGHT margin of the bladder corresponds to high-density tissue on comparison CT. Findings remain concerning for hematoma versus mass. Electronically Signed   By: Genevive Bi M.D.   On: 08/30/2022 17:53   DG Chest 2 View  Result Date: 08/30/2022 CLINICAL DATA:  Admission EXAM: CHEST - 2 VIEW COMPARISON:  Radiograph 03/05/2017, CT 11/16/2019 FINDINGS: Unchanged cardiomediastinal silhouette. There is no focal airspace consolidation. There is no large effusion or evidence of pneumothorax. Unchanged emphysema. Unchanged reticulation in the right apex. Axillary surgical clips. Thoracic spondylosis. Left shoulder  degenerative changes. Mild thoracic spondylosis. IMPRESSION: No evidence of acute cardiopulmonary disease. Apical  predominant emphysema with chronic interstitial changes. Electronically Signed   By: Caprice Renshaw M.D.   On: 08/30/2022 16:48   CT ABDOMEN PELVIS WO CONTRAST  Result Date: 08/30/2022 CLINICAL DATA:  Acute abdominal pain EXAM: CT ABDOMEN AND PELVIS WITHOUT CONTRAST TECHNIQUE: Multidetector CT imaging of the abdomen and pelvis was performed following the standard protocol without IV contrast. RADIATION DOSE REDUCTION: This exam was performed according to the departmental dose-optimization program which includes automated exposure control, adjustment of the mA and/or kV according to patient size and/or use of iterative reconstruction technique. COMPARISON:  None Available. FINDINGS: Lower chest: No acute abnormality. Hepatobiliary: Gallstones are present. There is no biliary ductal dilatation. The liver is within normal limits. Pancreas: Unremarkable. No pancreatic ductal dilatation or surrounding inflammatory changes. Spleen: Normal in size without focal abnormality. Adrenals/Urinary Tract: There is mild bilateral hydroureteronephrosis to the level of the bladder. No obstructing calculi are seen. There is renal cortical scarring bilaterally. The adrenal glands are within normal limits. The bladder is distended. There is a small focus of air in the bladder. There is mild inflammatory stranding and wall thickening of the anterior wall of the bladder. There is heterogeneous hyperdensity in the anterior right bladder measuring proximally 3.0 x 7.0 by 3.0 cm. Stomach/Bowel: Small hiatal hernia is present. Stomach is otherwise within normal limits. Appendix appears normal. No evidence of bowel wall thickening, distention, or inflammatory changes. There is sigmoid colon diverticulosis. Vascular/Lymphatic: Aortic atherosclerosis. No enlarged abdominal or pelvic lymph nodes. Reproductive: Uterus and bilateral adnexa are unremarkable. Other: No abdominal wall hernia or abnormality. No abdominopelvic ascites. Musculoskeletal:  Degenerative changes affect the spine. There is nonspecific subcutaneous lumbar edema. Bilateral hip arthroplasties are present. IMPRESSION: 1. Bladder wall thickening with surrounding inflammation worrisome for cystitis. 2. Heterogeneous hyperdensity in the anterior right bladder may represent hematoma or mass. Recommend further evaluation with cystoscopy. 3. Small focus of air in the bladder may be related to recent instrumentation. 4. Mild bilateral hydroureteronephrosis to the level of the bladder. No obstructing calculi are seen. 5. Cholelithiasis. Aortic Atherosclerosis (ICD10-I70.0). Electronically Signed   By: Darliss Cheney M.D.   On: 08/30/2022 16:46    I independently reviewed the above imaging studies.  Impression/Recommendation: #Gross hematuria, bladder mass vs hematoma on CT/RUS - UTI vs bladder stretch injury vs occult malignancy (cannot rule out) - 22Fr 3-way in place, 3rd port capped. Continue for 10-14 days - Continue to monitor quality of urine--currently light pink. If clots develop in foley tubing, output is sluggish, or urine becomes markedly darker, manually irrigate with 50cc NS with a 50cc catheter-tipped syringe. If urine does not clear or flow is not restored, please obtain a bladder scan and then page Urology to discuss - [facility] - Outpatient work-up for gross hematuria including CTU, cystoscopy - IV CTX for empiric treatment of UTI; cater to culture results  #Acute renal failure, bilateral hydronephrosis - Likely postrenal in the setting of acute urinary retention - As above, continue foley catheter to drainage x10-14 days - RUS in 48h to assess for resolution of hydro from initial CT - Continue to trend Cr, strictly monitor I/Os - Monitor closely for post-obstructive diuresis  Casimiro Needle Cobin Cadavid 08/30/2022, 10:30 PM

## 2022-08-30 NOTE — ED Provider Notes (Signed)
Care assumed from previous provider, see note for full HPI.  In summation 77 year old recent discharge from hospital for femur injury s/p surgical intervention with subsequent AKI.  Came in here for hematuria, decreased urination as well as decreased bowel movements.  Was found to have significant hyponatremia as well as AKI.  She had Foley catheter drained greater than 1500 cc.  Suspect this is likely a post renal etiology of her acute renal failure.  Previous provider spoke with Dr. Vassie Loll with PCCM.  They will see patient.  Plan to follow-up on CBC, CT scan and nephrology consult Physical Exam  BP (!) 131/105   Pulse 82   Temp 97.6 F (36.4 C) (Oral)   Resp 18   SpO2 100%   Physical Exam Vitals and nursing note reviewed.  Constitutional:      General: She is not in acute distress.    Appearance: She is well-developed. She is not ill-appearing or diaphoretic.  HENT:     Head: Atraumatic.  Eyes:     Pupils: Pupils are equal, round, and reactive to light.  Cardiovascular:     Rate and Rhythm: Normal rate and regular rhythm.  Pulmonary:     Effort: Pulmonary effort is normal. No respiratory distress.  Abdominal:     General: There is no distension.     Palpations: Abdomen is soft.  Musculoskeletal:        General: Normal range of motion.     Cervical back: Normal range of motion and neck supple.  Skin:    General: Skin is warm and dry.  Neurological:     General: No focal deficit present.     Mental Status: She is alert and oriented to person, place, and time.    Procedures  .Critical Care  Performed by: Linwood Dibbles, PA-C Authorized by: Linwood Dibbles, PA-C   Critical care provider statement:    Critical care time (minutes):  35   Critical care was necessary to treat or prevent imminent or life-threatening deterioration of the following conditions:  Metabolic crisis   Critical care was time spent personally by me on the following activities:  Development of  treatment plan with patient or surrogate, discussions with consultants, evaluation of patient's response to treatment, examination of patient, ordering and review of laboratory studies, ordering and review of radiographic studies, ordering and performing treatments and interventions, pulse oximetry, re-evaluation of patient's condition and review of old charts  Labs Reviewed  BASIC METABOLIC PANEL - Abnormal; Notable for the following components:      Result Value   Sodium 119 (*)    Chloride 92 (*)    CO2 15 (*)    BUN 140 (*)    Creatinine, Ser 8.60 (*)    Calcium 7.9 (*)    GFR, Estimated 4 (*)    All other components within normal limits  URINALYSIS, ROUTINE W REFLEX MICROSCOPIC - Abnormal; Notable for the following components:   Color, Urine RED (*)    APPearance HAZY (*)    Hgb urine dipstick MODERATE (*)    Protein, ur 100 (*)    Nitrite POSITIVE (*)    Leukocytes,Ua TRACE (*)    All other components within normal limits  HEPATIC FUNCTION PANEL - Abnormal; Notable for the following components:   Albumin 3.0 (*)    Bilirubin, Direct 0.3 (*)    All other components within normal limits  CBC WITH DIFFERENTIAL/PLATELET - Abnormal; Notable for the following components:   WBC 13.6 (*)  RBC 2.65 (*)    Hemoglobin 7.1 (*)    HCT 22.5 (*)    RDW 17.3 (*)    Platelets 485 (*)    Neutro Abs 11.0 (*)    Abs Immature Granulocytes 0.11 (*)    All other components within normal limits  URINE CULTURE  LACTIC ACID, PLASMA  CBC WITH DIFFERENTIAL/PLATELET  LACTIC ACID, PLASMA  CK  BASIC METABOLIC PANEL  BASIC METABOLIC PANEL  CBC   DG Chest 2 View  Result Date: 08/30/2022 CLINICAL DATA:  Admission EXAM: CHEST - 2 VIEW COMPARISON:  Radiograph 03/05/2017, CT 11/16/2019 FINDINGS: Unchanged cardiomediastinal silhouette. There is no focal airspace consolidation. There is no large effusion or evidence of pneumothorax. Unchanged emphysema. Unchanged reticulation in the right apex.  Axillary surgical clips. Thoracic spondylosis. Left shoulder degenerative changes. Mild thoracic spondylosis. IMPRESSION: No evidence of acute cardiopulmonary disease. 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 Electronically Signed   By: Caprice Renshaw M.D.   On: 08/30/2022 16:48   CT ABDOMEN PELVIS WO CONTRAST  Result Date: 08/30/2022 CLINICAL DATA:  Acute abdominal pain EXAM: CT ABDOMEN AND PELVIS WITHOUT CONTRAST TECHNIQUE: Multidetector CT imaging of the abdomen and pelvis was performed following the standard protocol without IV contrast. RADIATION DOSE REDUCTION: This exam was performed according to the departmental dose-optimization program which includes automated exposure control, adjustment of the mA and/or kV according to patient size and/or use of iterative reconstruction technique. COMPARISON:  None Available. FINDINGS: Lower chest: No acute abnormality. Hepatobiliary: Gallstones are present. There is no biliary ductal dilatation. The liver is within normal limits. Pancreas: Unremarkable. No pancreatic ductal dilatation or surrounding inflammatory changes. Spleen: Normal in size without focal abnormality. Adrenals/Urinary Tract: There is mild bilateral hydroureteronephrosis to the level of the bladder. No obstructing calculi are seen. There is renal cortical scarring bilaterally. The adrenal glands are within normal limits. The bladder is distended. There is a small focus of air in the bladder. There is mild inflammatory stranding and wall thickening of the anterior wall of the bladder. There is heterogeneous hyperdensity in the anterior right bladder measuring proximally 3.0 x 7.0 by 3.0 cm. Stomach/Bowel: Small hiatal hernia is present. Stomach is otherwise within normal limits. Appendix appears normal. No evidence of bowel wall thickening, distention, or inflammatory changes. There is sigmoid colon diverticulosis. Vascular/Lymphatic: Aortic atherosclerosis. No enlarged abdominal  or pelvic lymph nodes. Reproductive: Uterus and bilateral adnexa are unremarkable. Other: No abdominal wall hernia or abnormality. No abdominopelvic ascites. Musculoskeletal: Degenerative changes affect the spine. There is nonspecific subcutaneous lumbar edema. Bilateral hip arthroplasties are present. IMPRESSION: 1. Bladder wall thickening with surrounding inflammation worrisome for cystitis. 2. Heterogeneous hyperdensity in the anterior right bladder may represent hematoma or mass. Recommend further evaluation with cystoscopy. 3. Small focus of air in the bladder may be related to recent instrumentation. 4. Mild bilateral hydroureteronephrosis to the level of the bladder. No obstructing calculi are seen. 5. Cholelithiasis. Aortic Atherosclerosis (ICD10-I70.0). Electronically Signed   By: Darliss Cheney M.D.   On: 08/30/2022 16:46   MM 3D SCREENING MAMMOGRAM BILATERAL BREAST  Result Date: 08/13/2022 CLINICAL DATA:  Screening. EXAM: DIGITAL SCREENING BILATERAL MAMMOGRAM WITH TOMOSYNTHESIS AND CAD TECHNIQUE: Bilateral screening digital craniocaudal and mediolateral oblique mammograms were obtained. Bilateral screening digital breast tomosynthesis was performed. The images were evaluated with computer-aided detection. COMPARISON:  Previous exam(s). ACR Breast Density Category b: There are scattered areas of fibroglandular density. FINDINGS: There are no findings suspicious for malignancy. IMPRESSION: No mammographic evidence of malignancy. A  result letter of this screening mammogram will be mailed directly to the patient. RECOMMENDATION: Screening mammogram in one year. (Code:[id number]) BI-RADS CATEGORY  1: Negative. Electronically Signed   By: Edwin Cap M.D.   On: 08/13/2022 13:07    ED Course / MDM     Care assumed from previous provider, see note for full HPI.  In summation 77 year old recent discharge from hospital for femur injury s/p surgical intervention with subsequent AKI.  Came in here for  hematuria, decreased urination as well as decreased bowel movements.  Was found to have significant hyponatremia as well as AKI.  She had Foley catheter drained greater than 1500 cc.  Suspect this is likely a post renal etiology of her acute renal failure.  Previous provider spoke with Dr. Vassie Loll with PCCM.  They will see patient.  Plan to follow-up on CBC, CT scan and nephrology consult  CBC hemoglobin 7.1, symptomatic, will discuss for transfusion does have moderate blood in urine and is anticoagulated.  Ct scan with bladder wall thickening stranding formation worrisome for cystitis, urinalysis positive for nitrates she has gotten Rocephin.  Hyperdensity bladder possible hematoma or mass, small foci of air in bladder possibly related to recent instrumentation  Paged nephrology x2 , no answer  Will be admitted to critical care for significant laboratory abnormalities.  The patient appears reasonably stabilized for admission considering the current resources, flow, and capabilities available in the ED at this time, and I doubt any other [facility] requiring further screening and/or treatment in the ED prior to admission.   Medical Decision Making Amount and/or Complexity of Data Reviewed External Data Reviewed: labs, radiology, ECG and notes. Labs: ordered. Decision-making details documented in ED Course. Radiology: ordered and independent interpretation performed. Decision-making details documented in ED Course. ECG/medicine tests: ordered and independent interpretation performed. Decision-making details documented in ED Course.  Risk OTC drugs. Prescription drug management. Parenteral controlled substances. Decision regarding hospitalization.   Hyponatremia Renal failure UTI Urinary retention Symptomatic anemia      Allina Riches A, PA-C 08/30/22 1652    Wynetta Fines, MD 08/31/22 [phone]

## 2022-08-30 NOTE — ED Triage Notes (Signed)
Pt BIB EMS from home, c/o blood in urine. Onset x 2 days. Pt newly placed on blood thinner (eliquis) in April related to femur surgery on April 29. Denies pain, nausea/vomit.  BP 150/40 P 76 Spo2 100%

## 2022-08-30 NOTE — ED Notes (Signed)
In and out catheterization performed at bedside per order. 1550 mL urinary return. Urine drainage bag filled with urine dark red in color.

## 2022-08-30 NOTE — ED Notes (Addendum)
495 mL of bladder noted on bladder scanner. Will obtain in and out kit.

## 2022-08-31 LAB — CBC
HCT: 21.3 % — ABNORMAL LOW (ref 36.0–46.0)
HCT: 26.1 % — ABNORMAL LOW (ref 36.0–46.0)
Hemoglobin: 6.9 g/dL — CL (ref 12.0–15.0)
Hemoglobin: 8.4 g/dL — ABNORMAL LOW (ref 12.0–15.0)
MCH: 27.6 pg (ref 26.0–34.0)
MCH: 28 pg (ref 26.0–34.0)
MCHC: 32.4 g/dL (ref 30.0–36.0)
MCHC: 32.2 g/dL (ref 30.0–36.0)
MCV: 85.2 fL (ref 80.0–100.0)
MCV: 87 fL (ref 80.0–100.0)
Platelets: 359 10*3/uL (ref 150–400)
Platelets: 397 10*3/uL (ref 150–400)
RBC: 2.5 MIL/uL — ABNORMAL LOW (ref 3.87–5.11)
RBC: 3 MIL/uL — ABNORMAL LOW (ref 3.87–5.11)
RDW: 17.3 % — ABNORMAL HIGH (ref 11.5–15.5)
RDW: 17.4 % — ABNORMAL HIGH (ref 11.5–15.5)
WBC: 9.6 10*3/uL (ref 4.0–10.5)
WBC: 10 10*3/uL (ref 4.0–10.5)
nRBC: 0 % (ref 0.0–0.2)
nRBC: 0 % (ref 0.0–0.2)

## 2022-08-31 LAB — BASIC METABOLIC PANEL
Anion gap: 12 (ref 5–15)
Anion gap: 12 (ref 5–15)
Anion gap: 8 (ref 5–15)
BUN: 103 mg/dL — ABNORMAL HIGH (ref 8–23)
BUN: 82 mg/dL — ABNORMAL HIGH (ref 8–23)
BUN: 61 mg/dL — ABNORMAL HIGH (ref 8–23)
CO2: 18 mmol/L — ABNORMAL LOW (ref 22–32)
CO2: 19 mmol/L — ABNORMAL LOW (ref 22–32)
CO2: 20 mmol/L — ABNORMAL LOW (ref 22–32)
Calcium: 8.3 mg/dL — ABNORMAL LOW (ref 8.9–10.3)
Calcium: 8.2 mg/dL — ABNORMAL LOW (ref 8.9–10.3)
Calcium: 8.1 mg/dL — ABNORMAL LOW (ref 8.9–10.3)
Chloride: 106 mmol/L (ref 98–111)
Chloride: 108 mmol/L (ref 98–111)
Chloride: 108 mmol/L (ref 98–111)
Creatinine, Ser: 4.38 mg/dL — ABNORMAL HIGH (ref 0.44–1.00)
Creatinine, Ser: 2.63 mg/dL — ABNORMAL HIGH (ref 0.44–1.00)
Creatinine, Ser: 1.83 mg/dL — ABNORMAL HIGH (ref 0.44–1.00)
GFR, Estimated: 10 mL/min — ABNORMAL LOW (ref >60)
GFR, Estimated: 18 mL/min — ABNORMAL LOW (ref >60)
GFR, Estimated: 28 mL/min — ABNORMAL LOW (ref >60)
Glucose, Bld: 72 mg/dL (ref 70–99)
Glucose, Bld: 89 mg/dL (ref 70–99)
Glucose, Bld: 120 mg/dL — ABNORMAL HIGH (ref 70–99)
Potassium: 4.4 mmol/L (ref 3.5–5.1)
Potassium: 3.9 mmol/L (ref 3.5–5.1)
Potassium: 4.2 mmol/L (ref 3.5–5.1)
Sodium: 136 mmol/L (ref 135–145)
Sodium: 139 mmol/L (ref 135–145)
Sodium: 136 mmol/L (ref 135–145)

## 2022-08-31 LAB — BPAM RBC: ISSUE DATE / TIME: 202405120458

## 2022-08-31 LAB — URINE CULTURE: Culture: NO GROWTH

## 2022-08-31 LAB — TYPE AND SCREEN: Antibody Screen: NEGATIVE

## 2022-08-31 LAB — PREPARE RBC (CROSSMATCH)

## 2022-08-31 MED ORDER — SODIUM CHLORIDE 0.9% IV SOLUTION: Freq: Once | INTRAVENOUS | Status: DC

## 2022-08-31 MED ORDER — SODIUM CHLORIDE 0.45 % IV SOLN: INTRAVENOUS | Status: DC

## 2022-08-31 NOTE — Progress Notes (Signed)
eLink Physician-Brief Progress Note Patient Name: Kristen Escobar DOB: 1946-02-25 MRN: [id number]   Date of Service  08/31/2022  HPI/Events of Note  Notified of Hgb 6.9 from 6.4 after 1 unit PRBC Still with hematuria and with significant UO Na 136 from 129 from 119 On LR at 150 cc/hr  eICU Interventions  Ordered to transfuse 1 unit PRBC Shift LR to 0.45% NS for now and reassess with next set of labs     Intervention Category Intermediate Interventions: Electrolyte abnormality - evaluation and management;Bleeding - evaluation and treatment with blood products  Darl Pikes 08/31/2022, 4:08 AM

## 2022-08-31 NOTE — Progress Notes (Signed)
Subjective: NAEO. Foley catheter draining light pink urine. Suprapubic discomfort improved. Cr 2.63. Electrolytes wnl. Hgb 6.9 following 1u pRBC, additional 1u pRBC transfused with improvement to 8.4.  Objective: Vital signs in last 24 hours: Temp:  [97.6 F (36.4 C)-98.2 F (36.8 C)] 98.1 F (36.7 C) (05/12 0827) Pulse Rate:  [72-84] 82 (05/12 0800) Resp:  [13-25] 14 (05/12 0800) BP: (94-144)/(31-105) 127/87 (05/12 0800) SpO2:  [90 %-100 %] 96 % (05/12 0800)  Intake/Output from previous day: 05/11 0701 - 05/12 0700 In: 3388.5 [I.V.:1159.6; Blood:570; IV Piggyback:1198.9] Out: 6400 [Urine:6400] Intake/Output this shift: Total I/O In: 159.9 [I.V.:149.9; Other:10] Out: 750 [Urine:750]  Physical Exam:  General: Alert and oriented CV: RRR Lungs: Clear Abdomen: Soft, ND GU: 22Fr 3-way in place, urine is light pink Ext: NT, No erythema  Lab Results: Recent Labs    08/30/22 1710 08/31/22 0056 08/31/22 0835  HGB 6.4* 6.9* 8.4*  HCT 19.7* 21.3* 26.1*   BMET Recent Labs    08/31/22 0056 08/31/22 0835  NA 136 139  K 4.4 3.9  CL 106 108  CO2 18* 19*  GLUCOSE 72 89  BUN 103* 82*  CREATININE 4.38* 2.63*  CALCIUM 8.3* 8.2*     Studies/Results: US RENAL  Result Date: 08/30/2022 CLINICAL DATA:  Acute renal injury EXAM: RENAL / URINARY TRACT ULTRASOUND COMPLETE COMPARISON:  None Available. FINDINGS: Right Kidney: Renal measurements: 11.3 x 5.8 x 5.1 cm = volume: 174 mL. Echogenicity within normal limits. No mass or hydronephrosis visualized. Left Kidney: Renal measurements: 11.1 x 4.9 x 5.1 cm = volume: 145.7 mL. Echogenicity within normal limits. No mass or hydronephrosis visualized. Bladder: High-density material along the RIGHT wall the bladder measuring 3.8 x 2.9 x 4.4 cm and corresponds to high-density material on comparison CT. Other: None. IMPRESSION: 1. Normal kidneys. 2. High-density tissue along the RIGHT margin of the bladder corresponds to high-density tissue  on comparison CT. Findings remain concerning for hematoma versus mass. Electronically Signed   By: Genevive Bi M.D.   On: 08/30/2022 17:53   DG Chest 2 View  Result Date: 08/30/2022 CLINICAL DATA:  Admission EXAM: CHEST - 2 VIEW COMPARISON:  Radiograph 03/05/2017, CT 11/16/2019 FINDINGS: Unchanged cardiomediastinal silhouette. There is no focal airspace consolidation. There is no large effusion or evidence of pneumothorax. Unchanged emphysema. Unchanged reticulation in the right apex. Axillary surgical clips. Thoracic spondylosis. Left shoulder degenerative changes. Mild thoracic spondylosis. IMPRESSION: No evidence of acute cardiopulmonary disease. 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 Electronically Signed   By: Caprice Renshaw M.D.   On: 08/30/2022 16:48   CT ABDOMEN PELVIS WO CONTRAST  Result Date: 08/30/2022 CLINICAL DATA:  Acute abdominal pain EXAM: CT ABDOMEN AND PELVIS WITHOUT CONTRAST TECHNIQUE: Multidetector CT imaging of the abdomen and pelvis was performed following the standard protocol without IV contrast. RADIATION DOSE REDUCTION: This exam was performed according to the departmental dose-optimization program which includes automated exposure control, adjustment of the mA and/or kV according to patient size and/or use of iterative reconstruction technique. COMPARISON:  None Available. FINDINGS: Lower chest: No acute abnormality. Hepatobiliary: Gallstones are present. There is no biliary ductal dilatation. The liver is within normal limits. Pancreas: Unremarkable. No pancreatic ductal dilatation or surrounding inflammatory changes. Spleen: Normal in size without focal abnormality. Adrenals/Urinary Tract: There is mild bilateral hydroureteronephrosis to the level of the bladder. No obstructing calculi are seen. There is renal cortical scarring bilaterally. The adrenal glands are within normal limits. The bladder is distended. There is a small focus  of air in the  bladder. There is mild inflammatory stranding and wall thickening of the anterior wall of the bladder. There is heterogeneous hyperdensity in the anterior right bladder measuring proximally 3.0 x 7.0 by 3.0 cm. Stomach/Bowel: Small hiatal hernia is present. Stomach is otherwise within normal limits. Appendix appears normal. No evidence of bowel wall thickening, distention, or inflammatory changes. There is sigmoid colon diverticulosis. Vascular/Lymphatic: Aortic atherosclerosis. No enlarged abdominal or pelvic lymph nodes. Reproductive: Uterus and bilateral adnexa are unremarkable. Other: No abdominal wall hernia or abnormality. No abdominopelvic ascites. Musculoskeletal: Degenerative changes affect the spine. There is nonspecific subcutaneous lumbar edema. Bilateral hip arthroplasties are present. IMPRESSION: 1. Bladder wall thickening with surrounding inflammation worrisome for cystitis. 2. Heterogeneous hyperdensity in the anterior right bladder may represent hematoma or mass. Recommend further evaluation with cystoscopy. 3. Small focus of air in the bladder may be related to recent instrumentation. 4. Mild bilateral hydroureteronephrosis to the level of the bladder. No obstructing calculi are seen. 5. Cholelithiasis. Aortic Atherosclerosis (ICD10-I70.0). Electronically Signed   By: Darliss Cheney M.D.   On: 08/30/2022 16:46    Assessment/Plan: #Gross hematuria, bladder mass vs hematoma on CT/RUS - UTI vs bladder stretch injury vs occult malignancy (cannot rule out) - 22Fr 3-way in place, 3rd port capped. Continue for 10-14 days - Continue to monitor quality of urine--currently light pink. If clots develop in foley tubing, output is sluggish, or urine becomes markedly darker, manually irrigate with 50cc NS with a 50cc catheter-tipped syringe. If urine does not clear or flow is not restored, please obtain a bladder scan and then page Urology to discuss - Hold [facility] - Outpatient work-up for gross hematuria  including CTU, cystoscopy - IV CTX for empiric treatment of UTI; cater to culture results   #Acute renal failure, bilateral hydronephrosis - Cr improving tremendously with decompression - Likely postrenal in the setting of acute urinary retention - As above, continue foley catheter to drainage x10-14 days - RUS on 09/01/22 to assess for resolution of hydro from initial CT - Continue to trend Cr, strictly monitor I/Os - Monitor closely for post-obstructive diuresis   LOS: 1 day   Kristen Escobar 08/31/2022, 11:58 AM

## 2022-08-31 NOTE — Progress Notes (Addendum)
NAME:  Kristen Escobar, MRN:  [id number], DOB:  Feb 27, 1946, LOS: 1 ADMISSION DATE:  08/30/2022, CONSULTATION DATE:  08/31/2022  REFERRING MD:  Michaell Cowing, ED PA, CHIEF COMPLAINT:  hematuria   History of Present Illness:  77 year old woman who presents with abdominal and back pain and hematuria. She had a fall on the last week of April when she was at [facility] and underwent ORIF for right femur fracture on 4/30.  She was discharged within 3 days on Eliquis due to prior history of pulm embolism.  She had transient urinary retention during hospital stay. Since discharge she complains of constipation and decreased urine output.  She has been mostly bedbound and has been noted hematuria today and brought her to the emergency room.  Daughter works for [employer] and had arranged for home PT and OT Initial labs showed BUN 140/creatinine of 8.6, potassium 5.1. In and out cath showed bloody urine, 1.5 L was removed and then a Foley was placed.  There is another liter in the Foley bag. She reports significant improvement in abdominal pain  Pertinent  Medical History  Diffuse large B-cell lymphoma Hypothyroidism CKD Pulm embolism 2018  Significant Hospital Events: Including procedures, antibiotic start and stop dates in addition to other pertinent events   CT abdomen/pelvis 5/11 bladder wall thickening, bladder hematoma versus mass, mild bilateral hydroureteronephrosis 5/11 three-way Foley placed by urology  Interim History / Subjective:   Polyuric phase, 6.4 L urine output. Remains blood-tinged. Pain is much improved  Objective   Blood pressure 127/87, pulse 82, temperature 98.1 F (36.7 C), temperature source Oral, resp. rate 14, SpO2 96 %.        Intake/Output Summary (Last 24 hours) at 08/31/2022 1105 Last data filed at 08/31/2022 0800 Gross per 24 hour  Intake 3548.43 ml  Output 7150 ml  Net -3601.57 ml    There were no vitals filed for this visit.  Examination: General:  Elderly woman able to lie supine, no distress  HENT: No pallor, no icterus, no JVD Lungs: No accessory muscle use, clear breath sounds bilateral Cardiovascular: Murmur, S1-S2 regular Abdomen: Soft, nontender, left flank small acute area of ecchymosis Extremities: Right thigh- healed incisions, old healed scar over right knee Neuro: Alert, interactive, nonfocal GU: Blood tinged urine  Labs show sodium improved to 139, BUN/creatinine dropped to 82/2.6, hemoglobin improved to 8.4  Resolved Hospital Problem list     Assessment & Plan:  AKI due to urinary retention/obstructive uropathy in setting of ARB Hematuria, on Eliquis. Mild hydroureteronephrosis due to bladder obstruction   -Continue half-normal saline at 150/hour to keep up with urine output -Reassess BMET twice daily -Flushing three-way catheter per urology request, does not seem to need CBI, defer need for cystoscopy to them -Hold Eliquis, once hematuria is resolved, can use subcu heparin for prophylaxis -Empiric ceftriaxone to cover UTI  Anemia of blood loss -status post 2 unit PRBC Follow CBC   Hyponatremia -resolved Okay to correct acutely since this was an acute development  Hypertension -hold losartan -Resume amlodipine  Okay to transfer to floor and to [facility]   Best Practice (right click and "Reselect all SmartList Selections" daily)   Diet/type: Regular consistency (see orders) DVT prophylaxis: not indicated GI prophylaxis: N/A Lines: N/A Foley:  N/A Code Status:  full code Last date of multidisciplinary goals of care discussion [NA]  Labs   CBC: Recent Labs  Lab 08/30/22 1604 08/30/22 1710 08/31/22 0056 08/31/22 0835  WBC 13.6* 12.7* 9.6 10.0  NEUTROABS  11.0*  --   --   --   HGB 7.1* 6.4* 6.9* 8.4*  HCT 22.5* 19.7* 21.3* 26.1*  MCV 84.9 84.5 85.2 87.0  PLT 485* 379 359 397     Basic Metabolic Panel: Recent Labs  Lab 08/30/22 1330 08/30/22 1710 08/31/22 0056 08/31/22 0835  NA 119*  129* 136 139  K 5.1 4.5 4.4 3.9  CL 92* 99 106 108  CO2 15* 16* 18* 19*  GLUCOSE 96 88 72 89  BUN 140* 112* 103* 82*  CREATININE 8.60* 6.74* 4.38* 2.63*  CALCIUM 7.9* 7.9* 8.3* 8.2*    GFR: CrCl cannot be calculated (Unknown ideal weight.). Recent Labs  Lab 08/30/22 1330 08/30/22 1604 08/30/22 1710 08/31/22 0056 08/31/22 0835  WBC  --  13.6* 12.7* 9.6 10.0  LATICACIDVEN 0.9  --  0.9  --   --      Liver Function Tests: Recent Labs  Lab 08/30/22 1330  AST 33  ALT 6  ALKPHOS 65  BILITOT 0.9  PROT 7.1  ALBUMIN 3.0*    No results for input(s): "LIPASE", "AMYLASE" in the last 168 hours. No results for input(s): "AMMONIA" in the last 168 hours.  ABG    Component Value Date/Time   TCO2 24 11/12/2010 2137     Coagulation Profile: No results for input(s): "INR", "PROTIME" in the last 168 hours.  Cardiac Enzymes: Recent Labs  Lab 08/30/22 1710  CKTOTAL 72    HbA1C: Hgb A1c MFr Bld  Date/Time Value Ref Range Status  07/22/2022 08:56 AM 6.1 4.6 - 6.5 % Final    Comment:    Glycemic Control Guidelines for People with Diabetes:Non Diabetic:  <6%Goal of Therapy: <7%Additional Action Suggested:  >8%   06/02/2018 05:35 AM 5.7 (H) 4.8 - 5.6 % Final    Comment:    (NOTE) Pre diabetes:          5.7%-6.4% Diabetes:              >6.4% Glycemic control for   <7.0% adults with diabetes     CBG: No results for input(s): "GLUCAP" in the last 168 hours.   Cyril Mourning MD. Tonny Bollman. [facility] Pager : [phone]  If no response to pager , please call [phone] until 7 pm After 7:00 pm call Elink  [phone]   08/31/2022

## 2022-08-31 NOTE — Progress Notes (Signed)
  Transition of Care [facility]) Screening Note   Patient Details  Name: Kristen Escobar Date of Birth: 1946-04-12   Transition of Care [facility]) CM/SW Contact:    Adrian Prows, RN Phone Number: 08/31/2022, 9:23 AM    Transition of Care Department [facility]) has reviewed patient and no TOC needs have been identified at this time. We will continue to monitor patient advancement through interdisciplinary progression rounds. If new patient transition needs arise, please place a TOC consult.

## 2022-09-01 ENCOUNTER — Inpatient Hospital Stay (HOSPITAL_COMMUNITY): Payer: Medicare Other

## 2022-09-01 DIAGNOSIS — N179 Acute kidney failure, unspecified: Secondary | ICD-10-CM

## 2022-09-01 DIAGNOSIS — N1339 Other hydronephrosis: Secondary | ICD-10-CM

## 2022-09-01 DIAGNOSIS — R339 Retention of urine, unspecified: Secondary | ICD-10-CM | POA: Diagnosis not present

## 2022-09-01 DIAGNOSIS — R338 Other retention of urine: Secondary | ICD-10-CM

## 2022-09-01 LAB — TYPE AND SCREEN: Unit division: 0

## 2022-09-01 LAB — BASIC METABOLIC PANEL
Anion gap: 7 (ref 5–15)
BUN: 31 mg/dL — ABNORMAL HIGH (ref 8–23)
CO2: 20 mmol/L — ABNORMAL LOW (ref 22–32)
Calcium: 7.8 mg/dL — ABNORMAL LOW (ref 8.9–10.3)
Chloride: 107 mmol/L (ref 98–111)
Creatinine, Ser: 1.04 mg/dL — ABNORMAL HIGH (ref 0.44–1.00)
GFR, Estimated: 56 mL/min — ABNORMAL LOW (ref >60)
Glucose, Bld: 79 mg/dL (ref 70–99)
Potassium: 4.2 mmol/L (ref 3.5–5.1)
Sodium: 134 mmol/L — ABNORMAL LOW (ref 135–145)

## 2022-09-01 LAB — BPAM RBC
Blood Product Expiration Date: 202406032359
Blood Product Expiration Date: 202406032359
Unit Type and Rh: 6200

## 2022-09-01 LAB — MAGNESIUM: Magnesium: 1.6 mg/dL — ABNORMAL LOW (ref 1.7–2.4)

## 2022-09-01 LAB — CBC WITH DIFFERENTIAL/PLATELET
Abs Immature Granulocytes: 0.08 10*3/uL — ABNORMAL HIGH (ref 0.00–0.07)
Basophils Absolute: 0.1 10*3/uL (ref 0.0–0.1)
Basophils Relative: 1 %
Eosinophils Absolute: 0.5 10*3/uL (ref 0.0–0.5)
Eosinophils Relative: 4 %
HCT: 25 % — ABNORMAL LOW (ref 36.0–46.0)
Hemoglobin: 8 g/dL — ABNORMAL LOW (ref 12.0–15.0)
Immature Granulocytes: 1 %
Lymphocytes Relative: 14 %
Lymphs Abs: 1.7 10*3/uL (ref 0.7–4.0)
MCH: 28 pg (ref 26.0–34.0)
MCHC: 32 g/dL (ref 30.0–36.0)
MCV: 87.4 fL (ref 80.0–100.0)
Monocytes Absolute: 1 10*3/uL (ref 0.1–1.0)
Monocytes Relative: 8 %
Neutro Abs: 8.4 10*3/uL — ABNORMAL HIGH (ref 1.7–7.7)
Neutrophils Relative %: 72 %
Platelets: 388 10*3/uL (ref 150–400)
RBC: 2.86 MIL/uL — ABNORMAL LOW (ref 3.87–5.11)
RDW: 17.7 % — ABNORMAL HIGH (ref 11.5–15.5)
WBC: 11.6 10*3/uL — ABNORMAL HIGH (ref 4.0–10.5)
nRBC: 0 % (ref 0.0–0.2)

## 2022-09-01 LAB — PHOSPHORUS: Phosphorus: 2.5 mg/dL (ref 2.5–4.6)

## 2022-09-01 MED ORDER — MAGNESIUM SULFATE 2 GM/50ML IV SOLN
2.0000 g | Freq: Once | INTRAVENOUS | Status: AC
  Administered 2022-09-01: 2 g via INTRAVENOUS
  Filled 2022-09-01: qty 50

## 2022-09-01 MED ORDER — ASPIRIN 81 MG PO TBEC
81.0000 mg | DELAYED_RELEASE_TABLET | Freq: Two times a day (BID) | ORAL | Status: DC
  Administered 2022-09-01 – 2022-09-03 (×4): 81 mg via ORAL
  Filled 2022-09-01 (×4): qty 1

## 2022-09-01 MED ORDER — MELATONIN 3 MG PO TABS
3.0000 mg | ORAL_TABLET | Freq: Every day | ORAL | Status: DC
  Administered 2022-09-02: 3 mg via ORAL
  Filled 2022-09-01 (×2): qty 1

## 2022-09-01 NOTE — Progress Notes (Addendum)
Subjective: No acute events overnight.  First time meeting patient this morning.  She was accompanied by her husband at bedside.  He filled me in on her urologic history and we discussed the case and plan going forward.  All questions were answered to their satisfaction.  Objective: Vital signs in last 24 hours: Temp:  [98.1 F (36.7 C)-98.9 F (37.2 C)] 98.1 F (36.7 C) (05/13 0549) Pulse Rate:  [80-86] 80 (05/13 0549) Resp:  [18-20] 20 (05/13 0549) BP: (112-164)/(51-86) 112/86 (05/13 0549) SpO2:  [93 %-99 %] 96 % (05/13 0549) Weight:  [85.5 kg-86.1 kg] 85.5 kg (05/13 0500)  Intake/Output from previous day: 05/12 0701 - 05/13 0700 In: 3840.9 [P.O.:530; I.V.:3140.9; IV Piggyback:100] Out: 2975 [Urine:2975] Intake/Output this shift: No intake/output data recorded.  Physical Exam:  General: Alert and oriented CV: RRR Lungs: Clear Abdomen: Soft, ND GU: 22Fr 3-way in place, urine in tubing is lightly blood-tinged with no clot accumulation noted. Ext: NT, No erythema  Lab Results: Recent Labs    08/31/22 0056 08/31/22 0835 09/01/22 0431  HGB 6.9* 8.4* 8.0*  HCT 21.3* 26.1* 25.0*   BMET Recent Labs    08/31/22 1452 09/01/22 0431  NA 136 134*  K 4.2 4.2  CL 108 107  CO2 20* 20*  GLUCOSE 120* 79  BUN 61* 31*  CREATININE 1.83* 1.04*  CALCIUM 8.1* 7.8*     Studies/Results: US RENAL  Result Date: 08/30/2022 CLINICAL DATA:  Acute renal injury EXAM: RENAL / URINARY TRACT ULTRASOUND COMPLETE COMPARISON:  None Available. FINDINGS: Right Kidney: Renal measurements: 11.3 x 5.8 x 5.1 cm = volume: 174 mL. Echogenicity within normal limits. No mass or hydronephrosis visualized. Left Kidney: Renal measurements: 11.1 x 4.9 x 5.1 cm = volume: 145.7 mL. Echogenicity within normal limits. No mass or hydronephrosis visualized. Bladder: High-density material along the RIGHT wall the bladder measuring 3.8 x 2.9 x 4.4 cm and corresponds to high-density material on comparison CT.  Other: None. IMPRESSION: 1. Normal kidneys. 2. High-density tissue along the RIGHT margin of the bladder corresponds to high-density tissue on comparison CT. Findings remain concerning for hematoma versus mass. Electronically Signed   By: Genevive Bi M.D.   On: 08/30/2022 17:53   DG Chest 2 View  Result Date: 08/30/2022 CLINICAL DATA:  Admission EXAM: CHEST - 2 VIEW COMPARISON:  Radiograph 03/05/2017, CT 11/16/2019 FINDINGS: Unchanged cardiomediastinal silhouette. There is no focal airspace consolidation. There is no large effusion or evidence of pneumothorax. Unchanged emphysema. Unchanged reticulation in the right apex. Axillary surgical clips. Thoracic spondylosis. Left shoulder degenerative changes. Mild thoracic spondylosis. IMPRESSION: No evidence of acute cardiopulmonary disease. 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 Electronically Signed   By: Caprice Renshaw M.D.   On: 08/30/2022 16:48   CT ABDOMEN PELVIS WO CONTRAST  Result Date: 08/30/2022 CLINICAL DATA:  Acute abdominal pain EXAM: CT ABDOMEN AND PELVIS WITHOUT CONTRAST TECHNIQUE: Multidetector CT imaging of the abdomen and pelvis was performed following the standard protocol without IV contrast. RADIATION DOSE REDUCTION: This exam was performed according to the departmental dose-optimization program which includes automated exposure control, adjustment of the mA and/or kV according to patient size and/or use of iterative reconstruction technique. COMPARISON:  None Available. FINDINGS: Lower chest: No acute abnormality. Hepatobiliary: Gallstones are present. There is no biliary ductal dilatation. The liver is within normal limits. Pancreas: Unremarkable. No pancreatic ductal dilatation or surrounding inflammatory changes. Spleen: Normal in size without focal abnormality. Adrenals/Urinary Tract: There is mild bilateral hydroureteronephrosis to the level  of the bladder. No obstructing calculi are seen. There is renal  cortical scarring bilaterally. The adrenal glands are within normal limits. The bladder is distended. There is a small focus of air in the bladder. There is mild inflammatory stranding and wall thickening of the anterior wall of the bladder. There is heterogeneous hyperdensity in the anterior right bladder measuring proximally 3.0 x 7.0 by 3.0 cm. Stomach/Bowel: Small hiatal hernia is present. Stomach is otherwise within normal limits. Appendix appears normal. No evidence of bowel wall thickening, distention, or inflammatory changes. There is sigmoid colon diverticulosis. Vascular/Lymphatic: Aortic atherosclerosis. No enlarged abdominal or pelvic lymph nodes. Reproductive: Uterus and bilateral adnexa are unremarkable. Other: No abdominal wall hernia or abnormality. No abdominopelvic ascites. Musculoskeletal: Degenerative changes affect the spine. There is nonspecific subcutaneous lumbar edema. Bilateral hip arthroplasties are present. IMPRESSION: 1. Bladder wall thickening with surrounding inflammation worrisome for cystitis. 2. Heterogeneous hyperdensity in the anterior right bladder may represent hematoma or mass. Recommend further evaluation with cystoscopy. 3. Small focus of air in the bladder may be related to recent instrumentation. 4. Mild bilateral hydroureteronephrosis to the level of the bladder. No obstructing calculi are seen. 5. Cholelithiasis. Aortic Atherosclerosis (ICD10-I70.0). Electronically Signed   By: Darliss Cheney M.D.   On: 08/30/2022 16:46    Assessment/Plan: #Gross hematuria, bladder mass vs hematoma on CT/RUS - UTI vs bladder stretch injury vs occult malignancy (cannot rule out) - 22Fr 3-way in place, 3rd port capped. Continue for 10-14 days -Urine has nearly cleared.  Lightly blood-tinged in tubing. - Hold AC - Outpatient work-up for gross hematuria including CTU, cystoscopy - IV CTX for empiric treatment of UTI; cater to culture results. NGTD   #Acute renal failure-  resolved #Bilateral hydronephrosis - Serum creatinine has normalized today from 8.6 on arrival. - Likely postrenal in the setting of acute urinary retention - As above, continue foley catheter to drainage x10-14 days - Mild hydro noted on CT, renal ultrasound collected shortly thereafter showed no hydro bilaterally.  Will not need to rescan. - Continue to trend Cr, strictly monitor I/Os     LOS: 2 days   Scherrie Bateman Briona Korpela 09/01/2022, 8:42 AM

## 2022-09-01 NOTE — Plan of Care (Signed)
  Problem: Education: Goal: Knowledge of General Education information will improve Description: Including pain rating scale, medication(s)/side effects and non-pharmacologic comfort measures Outcome: Progressing   Problem: Activity: Goal: Risk for activity intolerance will decrease Outcome: Progressing   Problem: Nutrition: Goal: Adequate nutrition will be maintained Outcome: Progressing   

## 2022-09-01 NOTE — TOC Initial Note (Signed)
Transition of Care [facility]) - Initial/Assessment Note    Patient Details  Name: Kristen Escobar MRN: [id number] Date of Birth: January 28, 1946  Transition of Care [facility]) CM/SW Contact:    Lanier Clam, RN Phone Number: 09/01/2022, 3:43 PM  Clinical Narrative: Patient defers to spouse Kristen Escobar-they have [facility] who has already arranged HHC that was already following PTA for HHPT/OT-Kristen Escobar will continue with this service @ d/c. Has own transport home.                  Expected Discharge Plan: Home w Home Health Services Barriers to Discharge: Continued Medical Work up   Patient Goals and CMS Choice Patient states their goals for this hospitalization and ongoing recovery are:: Home CMS Medicare.gov Compare Post Acute Care list provided to:: Patient Represenative (must comment) (Kristen Escobar(spouse)) Choice offered to / list presented to : Spouse Drew ownership interest in [facility].provided to:: Spouse    Expected Discharge Plan and Services   Discharge Planning Services: CM Consult Post Acute Care Choice: Home Health Living arrangements for the past 2 months: Single Family Home                                      Prior Living Arrangements/Services Living arrangements for the past 2 months: Single Family Home Lives with:: Spouse Patient language and need for interpreter reviewed:: Yes Do you feel safe going back to the place where you live?: Yes      Need for Family Participation in Patient Care: Yes (Comment) Care giver support system in place?: Yes (comment) Current home services: DME, Home PT, Home OT (Per patient/spouse-already have HHC set up through [facility];has dme.) Criminal Activity/Legal Involvement Pertinent to Current Situation/Hospitalization: No - Comment as needed  Activities of Daily Living Home Assistive Devices/Equipment: Wheelchair ADL Screening (condition at time of admission) Patient's cognitive ability adequate to safely complete  daily activities?: Yes Is the patient deaf or have difficulty hearing?: No Does the patient have difficulty seeing, even when wearing glasses/contacts?: No Does the patient have difficulty concentrating, remembering, or making decisions?: No Patient able to express need for assistance with ADLs?: No Does the patient have difficulty dressing or bathing?: Yes Independently performs ADLs?: No Communication: Independent Dressing (OT): Needs assistance Is this a change from baseline?: Change from baseline, expected to last >3 days Grooming: Needs assistance Is this a change from baseline?: Change from baseline, expected to last >3 days Feeding: Independent Bathing: Needs assistance Is this a change from baseline?: Change from baseline, expected to last >3 days Toileting: Needs assistance Is this a change from baseline?: Change from baseline, expected to last >3days In/Out Bed: Dependent Is this a change from baseline?: Change from baseline, expected to last >3 days Walks in Home: Needs assistance Is this a change from baseline?: Change from baseline, expected to last >3 days Does the patient have difficulty walking or climbing stairs?: Yes Weakness of Legs: Both Weakness of Arms/Hands: Both  Permission Sought/Granted Permission sought to share information with : Case Manager Permission granted to share information with : Yes, Verbal Permission Granted  Share Information with NAME: Case manager           Emotional Assessment Appearance:: Appears stated age Attitude/Demeanor/Rapport: Gracious Affect (typically observed): Accepting Orientation: : Oriented to Self, Oriented to Place, Oriented to  Time Alcohol / Substance Use: Not Applicable Psych Involvement: No (comment)  Admission diagnosis:  Urinary retention [R33.9] Hyponatremia [E87.1] Acute cystitis with hematuria [N30.01] AKI (acute kidney injury) (HCC) [N17.9] Acute renal failure, unspecified acute renal failure type [facility])  [N17.9] 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   Quadriceps weakness 08/25/2019   Stiffness of right knee 08/25/2019   Decreased range of knee movement 07/12/2019   Buerger's disease (HCC) 03/31/2018   Pain in right knee 08/05/2017   Anemia 06/04/2017   GERD (gastroesophageal reflux disease) 06/04/2017   Pressure injury of skin 03/13/2017   Intracranial hemorrhage (HCC) 03/12/2017   SDH (subdural hematoma) (HCC) 03/11/2017   Uses feeding tube 03/11/2017   Hyperglycemia 03/11/2017   Left maxillary sinusitis 03/11/2017   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   Pulmonary embolus (HCC) 02/22/2017   Port or reservoir infection 01/20/2017   Port-A-Cath in place 01/20/2017   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   Neck mass 12/26/2016   Esophageal mass 11/27/2016   Vocal cord paralysis 11/18/2016   Hypothyroidism 03/25/2016   Bilateral carotid bruits 08/22/2015   Impaired glucose tolerance 08/22/2015   Chronic kidney disease 08/22/2015   Benign paroxysmal positional vertigo 02/05/2013   S/P left THA, AA 01/20/2012   OA (osteoarthritis) of knee 09/24/2011   Breast cancer of upper-outer quadrant of right female breast (HCC) 03/21/2011   Morbid obesity (HCC) 08/02/2009   Essential hypertension 08/02/2009   PCP:  Kristen Covey, MD Pharmacy:   [facility] [phone] [place], [place] - [address] [place] [address] Phone: [phone] Fax: [phone]     Social Determinants of Health (SDOH) Social History: SDOH Screenings   Food Insecurity: No Food Insecurity (09/01/2022)  Housing: Low Risk  (09/01/2022)  Transportation Needs: No  Transportation Needs (09/01/2022)  Utilities: Not At Risk (09/01/2022)  Alcohol Screen: Low Risk  (10/10/2021)  Depression (PHQ2-9): Low Risk  (07/22/2022)  Financial Resource Strain: Low Risk  (10/10/2021)  Physical Activity: Sufficiently Active (10/10/2021)  Social Connections: Unknown (10/10/2021)  Stress: No Stress Concern Present (10/10/2021)  Tobacco Use: Medium Risk (08/30/2022)   SDOH Interventions:     Readmission Risk Interventions     No data to display

## 2022-09-01 NOTE — Progress Notes (Signed)
[facility] Progress Note  Patient: Kristen Escobar     ZOX:[id number]  DOA: 08/30/2022   PCP: Kristian Covey, MD       Brief hospital course: This is a 77 year old female with recent right ORIF fracture status post repair, history of breast cancer, hypertension and hypothyroidism who presents to the hospital for blood in the urine for the past 2 days.  She states that she was placed on Eliquis postop and had been taking that regularly. In the ED: Creatinine 8.60, sodium 119, chloride 92, bicarb 15, WBC count 13.6, hemoglobin 7.1 and platelets 458. CT abdomen pelvis without contrast: 1. Bladder wall thickening with surrounding inflammation worrisome for cystitis. 2. Heterogeneous hyperdensity in the anterior right bladder may represent hematoma or mass. Recommend further evaluation with cystoscopy. 3. Small focus of air in the bladder may be related to recent instrumentation. 4. Mild bilateral hydroureteronephrosis to the level of the bladder. No obstructing calculi are seen. 5. Cholelithiasis.  Renal ultrasound: Normal kidneys-High-density tissue along the RIGHT margin of the bladder corresponds to high-density tissue on comparison CT. Findings remain concerning for hematoma versus mass. Urology was consulted on 5/11, three-way Foley placed, Eliquis held, Rocephin started empirically for treatment of UTI.  Subjective:  The patient has no complaints today. Assessment and Plan: Principal Problem:   Acute renal failure (HCC)- CKD 3b   Metabolic Acidosis   Clot retention of urine   Urinary retention -According to the husband her oral intake was extremely poor 2 days prior to admission - AKI may be secondary to poor oral intake but mainly due to urinary retention as bleeding started at home prior to foley -continue three-way Foley - Urine is pink-tinged and Eliquis is on hold - Cr has normalized- stop IVF as she is eating/drinking well   Active Problems:  Right  femur fracture - 4/30-fracture repair while she was in [facility] - Subsequently started on Eliquis - I am concerned about her developing a DVT as she is nonweightbearing in the right leg -will consult ortho (spoke with Dr Lequita Halt) to address WB status and anticoagulation  Hypomagnesemia - replace    Essential hypertension - Amlodipine  Hypothyroidism -Right   Breast cancer - 2011 - Status post right lumpectomy, adjuvant radiation and tamoxifen      Code Status: Full Code Consultants: urology Level of Care: Level of care: Progressive Total time on patient care: 40 min DVT prophylaxis:  SCDs Start: 08/30/22 1642     Objective:   Vitals:   08/31/22 2136 09/01/22 0131 09/01/22 0500 09/01/22 0549  BP: 130/61 117/62  112/86  Pulse: 82 85  80  Resp: 20 18  20   Temp: 98.7 F (37.1 C) 98.9 F (37.2 C)  98.1 F (36.7 C)  TempSrc: Oral Oral  Oral  SpO2: 99% 93%  96%  Weight:   85.5 kg   Height:       Filed Weights   08/31/22 1305 09/01/22 0500  Weight: 86.1 kg 85.5 kg   Exam: General exam: Appears comfortable  HEENT: oral mucosa moist Respiratory system: Clear to auscultation.  Cardiovascular system: S1 & S2 heard  Gastrointestinal system: Abdomen soft, non-tender, nondistended. Normal bowel sounds   Extremities: No cyanosis, clubbing or edema- extensive bruising of right leg Psychiatry:  Mood & affect appropriate.      CBC: Recent Labs  Lab 08/30/22 1604 08/30/22 1710 08/31/22 0056 08/31/22 0835 09/01/22 0431  WBC 13.6* 12.7* 9.6 10.0 11.6*  NEUTROABS 11.0*  --   --   --  8.4*  HGB 7.1* 6.4* 6.9* 8.4* 8.0*  HCT 22.5* 19.7* 21.3* 26.1* 25.0*  MCV 84.9 84.5 85.2 87.0 87.4  PLT 485* 379 359 397 388   Basic Metabolic Panel: Recent Labs  Lab 08/30/22 1710 08/31/22 0056 08/31/22 0835 08/31/22 1452 09/01/22 0431  NA 129* 136 139 136 134*  K 4.5 4.4 3.9 4.2 4.2  CL 99 106 108 108 107  CO2 16* 18* 19* 20* 20*  GLUCOSE 88 72 89 120* 79  BUN 112*  103* 82* 61* 31*  CREATININE 6.74* 4.38* 2.63* 1.83* 1.04*  CALCIUM 7.9* 8.3* 8.2* 8.1* 7.8*  MG  --   --   --   --  1.6*  PHOS  --   --   --   --  2.5   GFR: Estimated Creatinine Clearance: 46.7 mL/min (A) (by C-G formula based on SCr of 1.04 mg/dL (H)).  Scheduled Meds:  sodium chloride   Intravenous Once   sodium chloride   Intravenous Once   sodium chloride   Intravenous Once   amLODipine  10 mg Oral Daily   Chlorhexidine Gluconate Cloth  6 each Topical Daily   levothyroxine  75 mcg Oral Daily   melatonin  3 mg Oral QHS   Continuous Infusions:  cefTRIAXone (ROCEPHIN)  IV 1 g (08/31/22 1523)   Imaging and lab data was personally reviewed US RENAL  Result Date: 08/30/2022 CLINICAL DATA:  Acute renal injury EXAM: RENAL / URINARY TRACT ULTRASOUND COMPLETE COMPARISON:  None Available. FINDINGS: Right Kidney: Renal measurements: 11.3 x 5.8 x 5.1 cm = volume: 174 mL. Echogenicity within normal limits. No mass or hydronephrosis visualized. Left Kidney: Renal measurements: 11.1 x 4.9 x 5.1 cm = volume: 145.7 mL. Echogenicity within normal limits. No mass or hydronephrosis visualized. Bladder: High-density material along the RIGHT wall the bladder measuring 3.8 x 2.9 x 4.4 cm and corresponds to high-density material on comparison CT. Other: None. IMPRESSION: 1. Normal kidneys. 2. High-density tissue along the RIGHT margin of the bladder corresponds to high-density tissue on comparison CT. Findings remain concerning for hematoma versus mass. Electronically Signed   By: Genevive Bi M.D.   On: 08/30/2022 17:53   DG Chest 2 View  Result Date: 08/30/2022 CLINICAL DATA:  Admission EXAM: CHEST - 2 VIEW COMPARISON:  Radiograph 03/05/2017, CT 11/16/2019 FINDINGS: Unchanged cardiomediastinal silhouette. There is no focal airspace consolidation. There is no large effusion or evidence of pneumothorax. Unchanged emphysema. Unchanged reticulation in the right apex. Axillary surgical clips. Thoracic  spondylosis. Left shoulder degenerative changes. Mild thoracic spondylosis. IMPRESSION: No evidence of acute cardiopulmonary disease. 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 Electronically Signed   By: Caprice Renshaw M.D.   On: 08/30/2022 16:48   CT ABDOMEN PELVIS WO CONTRAST  Result Date: 08/30/2022 CLINICAL DATA:  Acute abdominal pain EXAM: CT ABDOMEN AND PELVIS WITHOUT CONTRAST TECHNIQUE: Multidetector CT imaging of the abdomen and pelvis was performed following the standard protocol without IV contrast. RADIATION DOSE REDUCTION: This exam was performed according to the departmental dose-optimization program which includes automated exposure control, adjustment of the mA and/or kV according to patient size and/or use of iterative reconstruction technique. COMPARISON:  None Available. FINDINGS: Lower chest: No acute abnormality. Hepatobiliary: Gallstones are present. There is no biliary ductal dilatation. The liver is within normal limits. Pancreas: Unremarkable. No pancreatic ductal dilatation or surrounding inflammatory changes. Spleen: Normal in size without focal abnormality. Adrenals/Urinary Tract: There is mild bilateral hydroureteronephrosis to the level of the bladder. No  obstructing calculi are seen. There is renal cortical scarring bilaterally. The adrenal glands are within normal limits. The bladder is distended. There is a small focus of air in the bladder. There is mild inflammatory stranding and wall thickening of the anterior wall of the bladder. There is heterogeneous hyperdensity in the anterior right bladder measuring proximally 3.0 x 7.0 by 3.0 cm. Stomach/Bowel: Small hiatal hernia is present. Stomach is otherwise within normal limits. Appendix appears normal. No evidence of bowel wall thickening, distention, or inflammatory changes. There is sigmoid colon diverticulosis. Vascular/Lymphatic: Aortic atherosclerosis. No enlarged abdominal or pelvic lymph nodes.  Reproductive: Uterus and bilateral adnexa are unremarkable. Other: No abdominal wall hernia or abnormality. No abdominopelvic ascites. Musculoskeletal: Degenerative changes affect the spine. There is nonspecific subcutaneous lumbar edema. Bilateral hip arthroplasties are present. IMPRESSION: 1. Bladder wall thickening with surrounding inflammation worrisome for cystitis. 2. Heterogeneous hyperdensity in the anterior right bladder may represent hematoma or mass. Recommend further evaluation with cystoscopy. 3. Small focus of air in the bladder may be related to recent instrumentation. 4. Mild bilateral hydroureteronephrosis to the level of the bladder. No obstructing calculi are seen. 5. Cholelithiasis. Aortic Atherosclerosis (ICD10-I70.0). Electronically Signed   By: Darliss Cheney M.D.   On: 08/30/2022 16:46    LOS: 2 days   Author: Calvert Cantor  09/01/2022 9:23 AM  To contact [facility]>   Check the care team in [facility] and look for the attending/consulting [facility] provider listed  Log into [email or web address] and use [facility]'s universal password   Go to> "[facility]"  and find provider  If you still have difficulty reaching the provider, please page the [facility] (Director on Call) for the Hospitalists listed on amion

## 2022-09-01 NOTE — Evaluation (Signed)
Physical Therapy Evaluation Patient Details Name: Kristen Escobar MRN: [id number] DOB: October 16, 1945 Today's Date: 09/01/2022  History of Present Illness  77 year old woman who presents with abdominal and back pain and hematuria.  She had a fall on the last week of April when she was at [facility] and underwent ORIF for right femur fracture on 4/30.  PMH: THA, B TKA, lumbar microdiscectomy 01/2022.  Clinical Impression  Pt admitted with above diagnosis. Min assist for pivot to recliner, pt did well with adhering to NWB status RLE. Since the distal femur fx, pt has only been transferring to a WC at home with min assist, she was receiving HHPT/OT prior to this admission.  She appears to be mobilizing at her post femur fracture baseline. Pt currently with functional limitations due to the deficits listed below (see PT Problem List). Pt will benefit from acute skilled PT to increase their independence and safety with mobility to allow discharge.          Recommendations for follow up therapy are one component of a multi-disciplinary discharge planning process, led by the attending physician.  Recommendations may be updated based on patient status, additional functional criteria and insurance authorization.  Follow Up Recommendations       Assistance Recommended at Discharge Intermittent Supervision/Assistance  Patient can return home with the following  A little help with walking and/or transfers;A little help with bathing/dressing/bathroom;Assistance with cooking/housework;Assist for transportation;Help with stairs or ramp for entrance    Equipment Recommendations None recommended by PT  Recommendations for Other Services       Functional Status Assessment Patient has had a recent decline in their functional status and demonstrates the ability to make significant improvements in function in a reasonable and predictable amount of time.     Precautions / Restrictions Precautions Precautions:  Fall Precaution Comments: fell late April and sustained distal femur fx, NWB Restrictions Weight Bearing Restrictions: Yes RLE Weight Bearing: Non weight bearing      Mobility  Bed Mobility Overal bed mobility: Modified Independent             General bed mobility comments: HOB up    Transfers Overall transfer level: Needs assistance Equipment used: Rolling walker (2 wheels) Transfers: Sit to/from Stand, Bed to chair/wheelchair/BSC Sit to Stand: Min assist Stand pivot transfers: Min assist         General transfer comment: sit to stand x 2 trials, min A to power up, able to stand for ~1 minute for pericare, good adherence to NWB status. Pivoted to recliner with min A to guide hips to recliner    Ambulation/Gait               General Gait Details: NT 2* NWB status  Stairs            Wheelchair Mobility    Modified Rankin (Stroke Patients Only)       Balance Overall balance assessment: Needs assistance Sitting-balance support: Feet unsupported, No upper extremity supported Sitting balance-Leahy Scale: Good     Standing balance support: Bilateral upper extremity supported, During functional activity, Reliant on assistive device for balance Standing balance-Leahy Scale: Poor                               Pertinent Vitals/Pain Pain Assessment Pain Assessment: 0-10 Pain Score: 2  Pain Location: R knee with activity Pain Descriptors / Indicators: Discomfort Pain Intervention(s): Limited activity within patient's tolerance,  Monitored during session, Repositioned    Home Living Family/patient expects to be discharged to:: Private residence Living Arrangements: Spouse/significant other Available Help at Discharge: Family;Available 24 hours/day Type of Home: House Home Access: Ramped entrance       Home Layout: Multi-level;Able to live on main level with bedroom/bathroom Home Equipment: Rolling Walker (2 wheels);Cane - single  point;Adaptive equipment;Wheelchair - manual;BSC/3in1 Additional Comments: was receiving HHPT/OT prior to this admission, used WC for mobility since R femur fx late April    Prior Function Prior Level of Function : Needs assist             Mobility Comments: has been pivoting to WC/BSC with min A only since distal femur fx late April ADLs Comments: sponge bathing since femur fx     Hand Dominance   Dominant Hand: Right    Extremity/Trunk Assessment   Upper Extremity Assessment Upper Extremity Assessment: Overall WFL for tasks assessed    Lower Extremity Assessment Lower Extremity Assessment: RLE deficits/detail;LLE deficits/detail RLE Deficits / Details: knee ext at least 3/5 RLE Sensation: history of peripheral neuropathy RLE Coordination: decreased fine motor LLE Sensation: history of peripheral neuropathy;decreased light touch    Cervical / Trunk Assessment Cervical / Trunk Assessment: Normal  Communication   Communication: No difficulties  Cognition Arousal/Alertness: Awake/alert Behavior During Therapy: WFL for tasks assessed/performed Overall Cognitive Status: Within Functional Limits for tasks assessed                                          General Comments      Exercises General Exercises - Lower Extremity Ankle Circles/Pumps: AROM, 10 reps, Both, Supine Quad Sets: AROM, Both, 5 reps, Supine Long Arc Quad: AROM, Right, 5 reps, Seated   Assessment/Plan    PT Assessment Patient needs continued PT services  PT Problem List Decreased balance;Decreased mobility;Decreased activity tolerance;Pain       PT Treatment Interventions Functional mobility training;Therapeutic activities;Gait training;Patient/family education;Therapeutic exercise    PT Goals (Current goals can be found in the Care Plan section)  Acute Rehab PT Goals Patient Stated Goal: return home PT Goal Formulation: With patient/family Time For Goal Achievement:  09/15/22 Potential to Achieve Goals: Good    Frequency Min 1X/week     Co-evaluation               AM-PAC PT "6 Clicks" Mobility  Outcome Measure Help needed turning from your back to your side while in a flat bed without using bedrails?: A Little Help needed moving from lying on your back to sitting on the side of a flat bed without using bedrails?: A Little Help needed moving to and from a bed to a chair (including a wheelchair)?: A Little Help needed standing up from a chair using your arms (e.g., wheelchair or bedside chair)?: A Little Help needed to walk in hospital room?: Total Help needed climbing 3-5 steps with a railing? : Total 6 Click Score: 14    End of Session Equipment Utilized During Treatment: Gait belt Activity Tolerance: Patient tolerated treatment well Patient left: in chair;with chair alarm set;with call bell/phone within reach Nurse Communication: Mobility status PT Visit Diagnosis: Pain;Muscle weakness (generalized) (M62.81);Difficulty in walking, not elsewhere classified (R26.2);Unsteadiness on feet (R26.81);History of falling (Z91.81) Pain - Right/Left: Right Pain - part of body: Knee    Time: 1610-9604 PT Time Calculation (min) (ACUTE ONLY): 24 min  Charges:   PT Evaluation $PT Eval Moderate Complexity: 1 Mod PT Treatments $Therapeutic Activity: 8-22 mins       Ralene Bathe Kistler PT 09/01/2022  Acute Rehabilitation Services  Office [phone]

## 2022-09-01 NOTE — Consult Note (Signed)
Subjective:  HPI: Kristen Escobar, 77 y.o. female with recent right periprosthetic femur fracture, s/p ORIF on 4/30 in [place]. History of right TKA by Dr. Homero Fellers Aluisio. She was discharged from [facility] on NWB restrictions and had her first follow-up appointment scheduled at our office for postoperative care on Thursday (5/16). Was admitted to the hospital on 5/11 due to abdominal pain and hematuria. Ortho was consulted for weight-bearing status and DVT prophylaxis recs. Was previously on Eliquis due to hx of PE, but this was discontinued due to hematuria.   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   Quadriceps weakness 08/25/2019   Stiffness of right knee 08/25/2019   Decreased range of knee movement 07/12/2019   Buerger's disease (HCC) 03/31/2018   Pain in right knee 08/05/2017   Anemia 06/04/2017   GERD (gastroesophageal reflux disease) 06/04/2017   Pressure injury of skin 03/13/2017   Intracranial hemorrhage (HCC) 03/12/2017   SDH (subdural hematoma) (HCC) 03/11/2017   Uses feeding tube 03/11/2017   Hyperglycemia 03/11/2017   Left maxillary sinusitis 03/11/2017   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   Pulmonary embolus (HCC) 02/22/2017   Port or reservoir infection 01/20/2017   Port-A-Cath in place 01/20/2017   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   Neck mass 12/26/2016   Esophageal mass 11/27/2016   Vocal cord paralysis 11/18/2016   Hypothyroidism 03/25/2016   Bilateral carotid bruits 08/22/2015   Impaired glucose tolerance 08/22/2015   Chronic kidney disease 08/22/2015   Benign paroxysmal positional vertigo 02/05/2013   S/P left THA, AA 01/20/2012   OA  (osteoarthritis) of knee 09/24/2011   Breast cancer of upper-outer quadrant of right female breast (HCC) 03/21/2011   Morbid obesity (HCC) 08/02/2009   Essential hypertension 08/02/2009    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   Diffuse large B cell lymphoma (HCC) dx'd 02/17/17   in remission 2 years now   had chemo    GERD (gastroesophageal reflux disease)    Heart murmur    07/03/21 echo: LVEF 60-65%, grade I DD, normal PASP, trivial MR, mild AI   History of radiation therapy 07/12/10,completed   right breast 60 Gy x30 fx   Hypertension    no meds currently   Hypothyroidism    Lymphoma (HCC)    Obesity    PE (pulmonary thromboembolism) (HCC) 01/2017   in setting of chemotherapy for lymphoma   Peripheral vascular disease (HCC) 1995   PT DEVELOPED CIRCULATION PROBLEMS IN BOTH HANDS AND GANGRENE OF BOTH INDEX FINGERS--REQUIRING AMPUTATION OF THE INDEX FINGERS AND VASCULAR SURGERY.  PT TOLD HER PROBLEMS RELATED TO SMOKING.   NO OTHER PROBLEMS SINCE.   Personal history of radiation therapy 2012   Right Breast Cancer   Pneumonia    Thyroid mass    Vocal cord paralysis    left    Past Surgical History:  Procedure Laterality Date   AMPUTATION     partial amputation of both index  fingers   BREAST LUMPECTOMY Right 04/01/2010   BREAST SURGERY  2011   lumpectomy with node sampling- RIGHT   CATARACT EXTRACTION, BILATERAL  approx 2017   COLONOSCOPY  06/17/2022   per Dr. Adela Lank, one adenomatous polyp, no repeats needed   ESOPHAGOGASTRODUODENOSCOPY     EXCISION MASS NECK Left 12/26/2016   Procedure: EXCISION MASS NECK;  Surgeon: Serena Colonel, MD;  Location: [facility] OR;  Service: ENT;  Laterality: Left;  open excision of thyroid mass left side with frozen section    HAND SURGERY Bilateral 1995   Amputaed pointer fingers bilaterally   IR FLUORO GUIDE PORT INSERTION RIGHT  01/14/2017   IR GASTROSTOMY TUBE MOD SED  02/19/2017   IR GASTROSTOMY TUBE REMOVAL  12/07/2018   IR PATIENT EVAL TECH 0-60 MINS  01/14/2019   IR REMOVAL TUN ACCESS W/ PORT W/O FL MOD SED  11/30/2017   IR REPLACE G-TUBE SIMPLE WO FLUORO  08/03/2017   IR REPLACE G-TUBE SIMPLE WO FLUORO  06/10/2018   IR REPLC GASTRO/COLONIC TUBE PERCUT W/FLUORO  02/25/2017   IR REPLC GASTRO/COLONIC TUBE PERCUT W/FLUORO  03/09/2017   IR US GUIDE BX ASP/DRAIN  12/07/2018   IR US GUIDE VASC ACCESS RIGHT  01/14/2017   JOINT REPLACEMENT     LAPAROSCOPIC GASTRIC RESECTION N/A 09/09/2019   Procedure: LAPAROSCOPIC CLOSURE OF GASTRO-CUTANEOUS FISTULA, EXCISION OF FISTUAL TRACK;  Surgeon: Ovidio Kin, MD;  Location: [facility] ORS;  Service: General;  Laterality: N/A;   LUMBAR LAMINECTOMY/DECOMPRESSION MICRODISCECTOMY N/A 01/30/2022   Procedure: Microdiscetomy hemilaminectomy decompression Lumbar Five-Sacral One;  Surgeon: Jene Every, MD;  Location: [facility] OR;  Service: Orthopedics;  Laterality: N/A;  90 mins 3 C-Bed   TONSILLECTOMY     TOTAL HIP ARTHROPLASTY  12/16/2011   right hip   TOTAL HIP ARTHROPLASTY  01/20/2012   Procedure: TOTAL HIP ARTHROPLASTY ANTERIOR APPROACH;  Surgeon: Shelda Pal, MD;  Location: [facility] ORS;  Service: Orthopedics;  Laterality: Left;   TOTAL KNEE ARTHROPLASTY Right 05/31/2018   Procedure: TOTAL KNEE ARTHROPLASTY;  Surgeon: Ollen Gross, MD;  Location: [facility] ORS;  Service: Orthopedics;  Laterality: Right;  Adductor Block   TUBAL LIGATION     VASCULAR SURGERY     both hands    Prior to Admission medications   Medication Sig Start Date End Date Taking? Authorizing Provider  amLODipine (NORVASC) 10 MG tablet Take 1 tablet (10 mg total) by mouth daily. 04/22/22  Yes Burchette, Elberta Fortis, MD  apixaban (ELIQUIS) 2.5 MG TABS tablet Take 2.5 mg by mouth 2 (two) times daily. 08/21/22  Yes [provider]  levothyroxine (SYNTHROID) 75 MCG tablet Take 1 tablet (75 mcg total) by mouth daily. 04/22/22  Yes Burchette, Elberta Fortis, MD  losartan-hydrochlorothiazide (HYZAAR) 50-12.5 MG tablet TAKE 1 TABLET BY MOUTH EVERY DAY 08/21/22  Yes Burchette, Elberta Fortis, MD  Multiple Vitamins-Minerals (ADULT GUMMY PO) Take 2 tablets by mouth daily. Woman's Gummy   Yes [provider]  omeprazole (PRILOSEC) 20 MG capsule Take 1 capsule (20 mg total) by mouth daily. 04/22/22  Yes Burchette, Elberta Fortis, MD  pramipexole (MIRAPEX) 0.5 MG tablet TAKE 1 TABLET BY MOUTH AT BEDTIME. Patient not taking: Reported on 08/30/2022 12/24/21   Kristian Covey, MD    No Known Allergies  Social History   Socioeconomic History   Marital status: Married    Spouse name: Not on file   Number of children: 3   Years of education: Not on file   Highest education level: Not  on file  Occupational History   Occupation: retired  Tobacco Use   Smoking status: Former    Packs/day: 1.00    Years: 20.00    Additional pack years: 0.00    Total pack years: 20.00    Types: Cigarettes    Quit date: 04/21/1993    Years since quitting: 29.3   Smokeless tobacco: Never  Vaping Use   Vaping Use: Never used  Substance and Sexual Activity   Alcohol use: Not Currently    Comment: OCCAS - MAYBE ONCE A MONTH   Drug use: No   Sexual activity: Not Currently  Other Topics Concern   Not on file  Social History Narrative   Not on file   Social Determinants of Health   Financial Resource Strain: Low Risk  (10/10/2021)   Overall Financial Resource Strain (CARDIA)    Difficulty of Paying Living Expenses: Not hard at all  Food Insecurity: No Food Insecurity (09/01/2022)   Hunger Vital Sign    Worried About Running Out of Food in the Last Year: Never true    Ran Out of Food in the Last Year: Never true  Transportation Needs: No Transportation Needs (09/01/2022)   PRAPARE - Administrator, Civil Service (Medical): No     Lack of Transportation (Non-Medical): No  Physical Activity: Sufficiently Active (10/10/2021)   Exercise Vital Sign    Days of Exercise per Week: 5 days    Minutes of Exercise per Session: 60 min  Stress: No Stress Concern Present (10/10/2021)   [employer] of Occupational Health - Occupational Stress Questionnaire    Feeling of Stress : Not at all  Social Connections: Unknown (10/10/2021)   Social Connection and Isolation Panel [NHANES]    Frequency of Communication with Friends and Family: More than three times a week    Frequency of Social Gatherings with Friends and Family: More than three times a week    Attends Religious Services: Never    Database administrator or Organizations: Yes    Attends Engineer, structural: More than 4 times per year    Marital Status: Not on file  Intimate Partner Violence: Not At Risk (09/01/2022)   Humiliation, Afraid, Rape, and Kick questionnaire    Fear of Current or Ex-Partner: No    Emotionally Abused: No    Physically Abused: No    Sexually Abused: No    Tobacco Use: Medium Risk (08/30/2022)   Patient History    Smoking Tobacco Use: Former    Smokeless Tobacco Use: Never    Passive Exposure: Not on file   Social History   Substance and Sexual Activity  Alcohol Use Not Currently   Comment: OCCAS - MAYBE ONCE A MONTH    Family History  Problem Relation Age of Onset   Cancer Mother        pt unaware of what kind   Hearing loss Mother    Coronary artery disease Father    Diabetes Father    Diabetes Sister    Breast cancer Sister    Coronary artery disease Brother    Coronary artery disease Brother    Colon cancer Neg Hx    Stomach cancer Neg Hx    Rectal cancer Neg Hx    Esophageal cancer Neg Hx     Review of Systems  Constitutional:  Negative for chills and fever.  HENT:  Negative for congestion, sore throat and tinnitus.   Eyes:  Negative for double  vision, photophobia and pain.  Respiratory:  Negative for  cough, shortness of breath and wheezing.   Cardiovascular:  Negative for chest pain, palpitations and orthopnea.  Gastrointestinal:  Negative for heartburn, nausea and vomiting.  Musculoskeletal:  Negative for joint pain.  Neurological:  Negative for dizziness, weakness and headaches.    Objective:  Physical Exam: Patient resting comfortably in bed upon exam.  Well nourished and well developed.  General: Alert and oriented x3, cooperative and pleasant, no acute distress.  Head: normocephalic, atraumatic, neck supple.  Eyes: EOMI.  Musculoskeletal:  Right lower extremity:  Dressings in place with no drainage.  No swelling or erythema about the right leg.  Calves soft and nontender.  Motor function intact in LE.   Neuro: Distal pulses 2+. Sensation to light touch intact in LE.   Vital signs in last 24 hours: Temp:  [98.1 F (36.7 C)-98.9 F (37.2 C)] 98.1 F (36.7 C) (05/13 1448) Pulse Rate:  [80-88] 88 (05/13 1448) Resp:  [18-20] 20 (05/13 1448) BP: (112-147)/(61-86) 147/65 (05/13 1448) SpO2:  [93 %-99 %] 97 % (05/13 1448) Weight:  [85.5 kg] 85.5 kg (05/13 0500)  Imaging Review Reviewed AP and lateral XR of the right knee with Dr. Lequita Halt. Will remain NWB to the RLE.  Assessment/Plan:  S/p right femoral ORIF  Reviewed plain films, maintain NWB to the RLE. Will switch DVT prophylaxis to 81 mg ASA BID.  If discharged from hospital will keep original f/u appointment with [place] on 5/16.  Arther Abbott, PA-C Orthopedic Surgery [facility] [place]

## 2022-09-02 DIAGNOSIS — N179 Acute kidney failure, unspecified: Secondary | ICD-10-CM | POA: Diagnosis not present

## 2022-09-02 DIAGNOSIS — R339 Retention of urine, unspecified: Secondary | ICD-10-CM | POA: Diagnosis not present

## 2022-09-02 DIAGNOSIS — R338 Other retention of urine: Secondary | ICD-10-CM | POA: Diagnosis not present

## 2022-09-02 DIAGNOSIS — N1339 Other hydronephrosis: Secondary | ICD-10-CM | POA: Diagnosis not present

## 2022-09-02 LAB — BASIC METABOLIC PANEL
Anion gap: 6 (ref 5–15)
BUN: 14 mg/dL (ref 8–23)
CO2: 22 mmol/L (ref 22–32)
Calcium: 8.3 mg/dL — ABNORMAL LOW (ref 8.9–10.3)
Chloride: 109 mmol/L (ref 98–111)
Creatinine, Ser: 0.84 mg/dL (ref 0.44–1.00)
GFR, Estimated: >60 mL/min (ref >60)
Glucose, Bld: 88 mg/dL (ref 70–99)
Potassium: 4.6 mmol/L (ref 3.5–5.1)
Sodium: 137 mmol/L (ref 135–145)

## 2022-09-02 LAB — MAGNESIUM: Magnesium: 1.7 mg/dL (ref 1.7–2.4)

## 2022-09-02 NOTE — Progress Notes (Signed)
[facility] Progress Note  Patient: Kristen Escobar     EAV:[id number]  DOA: 08/30/2022   PCP: Kristian Covey, MD       Brief hospital course: This is a 77 year old female with recent right ORIF fracture status post repair, history of breast cancer, hypertension and hypothyroidism who presents to the hospital for blood in the urine for the past 2 days.  She states that she was placed on Eliquis postop and had been taking that regularly. In the ED: Creatinine 8.60, sodium 119, chloride 92, bicarb 15, WBC count 13.6, hemoglobin 7.1 and platelets 458. CT abdomen pelvis without contrast: 1. Bladder wall thickening with surrounding inflammation worrisome for cystitis. 2. Heterogeneous hyperdensity in the anterior right bladder may represent hematoma or mass. Recommend further evaluation with cystoscopy. 3. Small focus of air in the bladder may be related to recent instrumentation. 4. Mild bilateral hydroureteronephrosis to the level of the bladder. No obstructing calculi are seen. 5. Cholelithiasis.  Renal ultrasound: Normal kidneys-High-density tissue along the RIGHT margin of the bladder corresponds to high-density tissue on comparison CT. Findings remain concerning for hematoma versus mass. Urology was consulted on 5/11, three-way Foley placed, Eliquis held, Rocephin started empirically for treatment of UTI.  Subjective:  No complaints today.   Assessment and Plan: Principal Problem:   Acute renal failure -  Metabolic Acidosis   Hematuria, Urinary retention with hydronephrosis -According to the husband her oral intake was extremely poor 2 days prior to admission - AKI may be secondary to poor oral intake but mainly due to urinary retention as bleeding started at home prior to foley - Eliquis held-  ASA started 5/13 by ortho - Cr has normalized - urine culture neg- on CTX empirically- dc this today - IVF stopped on 5/13 - f/u appt with Dr Marlou Porch for about 4-5 wks  from now - dc home with current foley - on ASA BID now and passing clots still with pink urine- follow - spoke with urology about plan today   Active Problems:  Anemia due to acute blood loss - received 2 U PRBC for Hgb of ~6 - Hgb now stable at ~ 8 - recheck in AM as bleeding has not completely resolved    Right femur fracture/ anticoagulation - 4/30-fracture repair while she was in [facility] - Subsequently started on Eliquis - I am concerned about her developing a DVT as she is nonweightbearing in the right LE -5/13 > consult ortho (spoke with Dr Lequita Halt) to address WB status and anticoagulation - ortho entered order for ASA 81 mg BID- cont NWB  Hypomagnesemia - replaced    Essential hypertension - Amlodipine  Hypothyroidism   Breast cancer - 2011 - Status post right lumpectomy, adjuvant radiation and tamoxifen      Code Status: Full Code Consultants: urology Level of Care: Level of care: Progressive Total time on patient care: 40 min DVT prophylaxis:  SCDs Start: 08/30/22 1642     Objective:   Vitals:   09/01/22 2023 09/02/22 0500 09/02/22 0525 09/02/22 0908  BP: (!) 125/51  (!) 147/60 (!) 148/64  Pulse: 82  73   Resp: 20     Temp: 99.1 F (37.3 C)  98.4 F (36.9 C)   TempSrc: Oral  Oral   SpO2: 94%  95%   Weight:  84.2 kg    Height:       Filed Weights   08/31/22 1305 09/01/22 0500 09/02/22 0500  Weight: 86.1 kg 85.5 kg 84.2  kg   Exam: General exam: Appears comfortable  HEENT: oral mucosa moist Respiratory system: Clear to auscultation.  Cardiovascular system: S1 & S2 heard  Gastrointestinal system: Abdomen soft, non-tender, nondistended. Normal bowel sounds   Extremities: No cyanosis, clubbing or edema- extensive bruising of right leg Psychiatry:  Mood & affect appropriate.      CBC: Recent Labs  Lab 08/30/22 1604 08/30/22 1710 08/31/22 0056 08/31/22 0835 09/01/22 0431  WBC 13.6* 12.7* 9.6 10.0 11.6*  NEUTROABS 11.0*  --   --    --  8.4*  HGB 7.1* 6.4* 6.9* 8.4* 8.0*  HCT 22.5* 19.7* 21.3* 26.1* 25.0*  MCV 84.9 84.5 85.2 87.0 87.4  PLT 485* 379 359 397 388    Basic Metabolic Panel: Recent Labs  Lab 08/31/22 0056 08/31/22 0835 08/31/22 1452 09/01/22 0431 09/02/22 0511  NA 136 139 136 134* 137  K 4.4 3.9 4.2 4.2 4.6  CL 106 108 108 107 109  CO2 18* 19* 20* 20* 22  GLUCOSE 72 89 120* 79 88  BUN 103* 82* 61* 31* 14  CREATININE 4.38* 2.63* 1.83* 1.04* 0.84  CALCIUM 8.3* 8.2* 8.1* 7.8* 8.3*  MG  --   --   --  1.6* 1.7  PHOS  --   --   --  2.5  --     GFR: Estimated Creatinine Clearance: 57.3 mL/min (by C-G formula based on SCr of 0.84 mg/dL).  Scheduled Meds:  sodium chloride   Intravenous Once   sodium chloride   Intravenous Once   sodium chloride   Intravenous Once   amLODipine  10 mg Oral Daily   aspirin EC  81 mg Oral BID   Chlorhexidine Gluconate Cloth  6 each Topical Daily   levothyroxine  75 mcg Oral Daily   melatonin  3 mg Oral QHS   Continuous Infusions:  cefTRIAXone (ROCEPHIN)  IV Stopped (09/02/22 1431)   Imaging and lab data was personally reviewed DG Knee 1-2 Views Right  Result Date: 09/01/2022 CLINICAL DATA:  History of right femoral fracture. Patient states surgery was done in [facility] [place]. EXAM: RIGHT KNEE - 1-2 VIEW COMPARISON:  None Available. FINDINGS: Postsurgical changes with subcutaneous emphysema and multiple surgical clips about the anterior aspect of the knee joint. Status post right knee arthroplasty. Long-stem intramedullary nail partially imaged with recent periprosthetic fracture with plate and screw fixation about the lateral aspect of the knee. IMPRESSION: Status post right knee arthroplasty. Long-stem intramedullary nail partially imaged with recent periprosthetic fracture and plate and screw fixation. Electronically Signed   By: Larose Hires D.O.   On: 09/01/2022 10:46    LOS: 3 days   Author: Calvert Cantor  09/02/2022 2:38 PM  To contact [facility]>   Check the care team in [facility] and look for the attending/consulting [facility] provider listed  Log into [email or web address] and use [place]'s universal password   Go to> "[facility]"  and find provider  If you still have difficulty reaching the provider, please page the [facility] (Director on Call) for the Hospitalists listed on amion

## 2022-09-02 NOTE — Progress Notes (Signed)
[facility] Progress Note  Patient: Kristen Escobar     NFA:[id number]  DOA: 08/30/2022   PCP: Kristian Covey, MD       Brief hospital course: This is a 77 year old female with recent right ORIF fracture status post repair, history of breast cancer, hypertension and hypothyroidism who presents to the hospital for blood in the urine for the past 2 days.  She states that she was placed on Eliquis postop and had been taking that regularly. In the ED: Creatinine 8.60, sodium 119, chloride 92, bicarb 15, WBC count 13.6, hemoglobin 7.1 and platelets 458. CT abdomen pelvis without contrast: 1. Bladder wall thickening with surrounding inflammation worrisome for cystitis. 2. Heterogeneous hyperdensity in the anterior right bladder may represent hematoma or mass. Recommend further evaluation with cystoscopy. 3. Small focus of air in the bladder may be related to recent instrumentation. 4. Mild bilateral hydroureteronephrosis to the level of the bladder. No obstructing calculi are seen. 5. Cholelithiasis.  Renal ultrasound: Normal kidneys-High-density tissue along the RIGHT margin of the bladder corresponds to high-density tissue on comparison CT. Findings remain concerning for hematoma versus mass. Urology was consulted on 5/11, three-way Foley placed, Eliquis held, Rocephin started empirically for treatment of UTI.  Subjective:  No complaints today.   Assessment and Plan: Principal Problem:   Acute renal failure -    Metabolic Acidosis   Urinary retention with hydronephrosis -According to the husband her oral intake was extremely poor 2 days prior to admission - AKI may be secondary to poor oral intake but mainly due to urinary retention as bleeding started at home prior to foley - Urine is now pink-tinged with small clots  - Eliquis is on hold - Cr has normalized - urine culture neg- on CTX empirically - IVF stopped on 5/13 - f/u appt with Dr Marlou Porch for about 4-5  wks from now - waiting on urology dc plan- d/w Dr Berneice Heinrich today   Active Problems:  Anemia due to acute blood loss - received 2 U PRBC for Hgb of ~6 - Hgb now stable at ~ 8-9 - as bleeding fully has not resolved- recheck tomorrow  Right femur fracture/ anticoagulation - 4/30-fracture repair while she was in [facility] - Subsequently started on Eliquis - I am concerned about her developing a DVT as she is nonweightbearing in the right LE -5/13 > consult ortho (spoke with Dr Lequita Halt) to address WB status and anticoagulation- ortho entered order for ASA 81 mg BID- cont NWB  Hypomagnesemia - replaced    Essential hypertension - Amlodipine  Hypothyroidism -Right  Breast cancer - 2011 - Status post right lumpectomy, adjuvant radiation and tamoxifen      Code Status: Full Code Consultants: urology Level of Care: Level of care: Progressive Total time on patient care: 40 min DVT prophylaxis:  SCDs Start: 08/30/22 1642     Objective:   Vitals:   09/01/22 2023 09/02/22 0500 09/02/22 0525 09/02/22 0908  BP: (!) 125/51  (!) 147/60 (!) 148/64  Pulse: 82  73   Resp: 20     Temp: 99.1 F (37.3 C)  98.4 F (36.9 C)   TempSrc: Oral  Oral   SpO2: 94%  95%   Weight:  84.2 kg    Height:       Filed Weights   08/31/22 1305 09/01/22 0500 09/02/22 0500  Weight: 86.1 kg 85.5 kg 84.2 kg   Exam: General exam: Appears comfortable  HEENT: oral mucosa moist Respiratory system: Clear to  auscultation.  Cardiovascular system: S1 & S2 heard  Gastrointestinal system: Abdomen soft, non-tender, nondistended. Normal bowel sounds   Extremities: No cyanosis, clubbing or edema- extensive bruising of right leg Psychiatry:  Mood & affect appropriate.      CBC: Recent Labs  Lab 08/30/22 1604 08/30/22 1710 08/31/22 0056 08/31/22 0835 09/01/22 0431  WBC 13.6* 12.7* 9.6 10.0 11.6*  NEUTROABS 11.0*  --   --   --  8.4*  HGB 7.1* 6.4* 6.9* 8.4* 8.0*  HCT 22.5* 19.7* 21.3* 26.1* 25.0*   MCV 84.9 84.5 85.2 87.0 87.4  PLT 485* 379 359 397 388    Basic Metabolic Panel: Recent Labs  Lab 08/31/22 0056 08/31/22 0835 08/31/22 1452 09/01/22 0431 09/02/22 0511  NA 136 139 136 134* 137  K 4.4 3.9 4.2 4.2 4.6  CL 106 108 108 107 109  CO2 18* 19* 20* 20* 22  GLUCOSE 72 89 120* 79 88  BUN 103* 82* 61* 31* 14  CREATININE 4.38* 2.63* 1.83* 1.04* 0.84  CALCIUM 8.3* 8.2* 8.1* 7.8* 8.3*  MG  --   --   --  1.6* 1.7  PHOS  --   --   --  2.5  --     GFR: Estimated Creatinine Clearance: 57.3 mL/min (by C-G formula based on SCr of 0.84 mg/dL).  Scheduled Meds:  sodium chloride   Intravenous Once   sodium chloride   Intravenous Once   sodium chloride   Intravenous Once   amLODipine  10 mg Oral Daily   aspirin EC  81 mg Oral BID   Chlorhexidine Gluconate Cloth  6 each Topical Daily   levothyroxine  75 mcg Oral Daily   melatonin  3 mg Oral QHS   Continuous Infusions:  cefTRIAXone (ROCEPHIN)  IV 1 g (09/01/22 1451)   Imaging and lab data was personally reviewed DG Knee 1-2 Views Right  Result Date: 09/01/2022 CLINICAL DATA:  History of right femoral fracture. Patient states surgery was done in [facility] [place]. EXAM: RIGHT KNEE - 1-2 VIEW COMPARISON:  None Available. FINDINGS: Postsurgical changes with subcutaneous emphysema and multiple surgical clips about the anterior aspect of the knee joint. Status post right knee arthroplasty. Long-stem intramedullary nail partially imaged with recent periprosthetic fracture with plate and screw fixation about the lateral aspect of the knee. IMPRESSION: Status post right knee arthroplasty. Long-stem intramedullary nail partially imaged with recent periprosthetic fracture and plate and screw fixation. Electronically Signed   By: Larose Hires D.O.   On: 09/01/2022 10:46    LOS: 3 days   Author: Calvert Cantor  09/02/2022 11:31 AM  To contact [facility]>   Check the care team in [facility] and look for the attending/consulting  [facility] provider listed  Log into [email or web address] and use [place]'s universal password   Go to> "[facility]"  and find provider  If you still have difficulty reaching the provider, please page the [facility] (Director on Call) for the Hospitalists listed on amion

## 2022-09-02 NOTE — Plan of Care (Signed)
Awaiting Urology documentation regarding antibiotics prior to dc.

## 2022-09-02 NOTE — Progress Notes (Addendum)
  Subjective: NAEON. Met with wife and husband again. Reviewed reasons to reach out to the clinic or hospital.   Objective: Vital signs in last 24 hours: Temp:  [98.1 F (36.7 C)-99.1 F (37.3 C)] 98.4 F (36.9 C) (05/14 0525) Pulse Rate:  [73-88] 73 (05/14 0525) Resp:  [20] 20 (05/13 2023) BP: (125-148)/(51-65) 148/64 (05/14 0908) SpO2:  [94 %-97 %] 95 % (05/14 0525) Weight:  [84.2 kg] 84.2 kg (05/14 0500)  Intake/Output from previous day: 05/13 0701 - 05/14 0700 In: 1241.9 [P.O.:520; I.V.:621.9; IV Piggyback:100] Out: 2600 [Urine:2600] Intake/Output this shift: Total I/O In: 120 [P.O.:120] Out: 650 [Urine:650]  Physical Exam:  General: Alert and oriented CV: RRR Lungs: Clear Abdomen: Soft, ND GU: 22Fr 3-way in place, urine in tubing is lightly blood-tinged with no clot accumulation noted. Ext: NT, No erythema  Lab Results: Recent Labs    08/31/22 0056 08/31/22 0835 09/01/22 0431  HGB 6.9* 8.4* 8.0*  HCT 21.3* 26.1* 25.0*   BMET Recent Labs    09/01/22 0431 09/02/22 0511  NA 134* 137  K 4.2 4.6  CL 107 109  CO2 20* 22  GLUCOSE 79 88  BUN 31* 14  CREATININE 1.04* 0.84  CALCIUM 7.8* 8.3*     Studies/Results: DG Knee 1-2 Views Right  Result Date: 09/01/2022 CLINICAL DATA:  History of right femoral fracture. Patient states surgery was done in [facility] [place]. EXAM: RIGHT KNEE - 1-2 VIEW COMPARISON:  None Available. FINDINGS: Postsurgical changes with subcutaneous emphysema and multiple surgical clips about the anterior aspect of the knee joint. Status post right knee arthroplasty. Long-stem intramedullary nail partially imaged with recent periprosthetic fracture with plate and screw fixation about the lateral aspect of the knee. IMPRESSION: Status post right knee arthroplasty. Long-stem intramedullary nail partially imaged with recent periprosthetic fracture and plate and screw fixation. Electronically Signed   By: Larose Hires D.O.   On:  09/01/2022 10:46    Assessment/Plan: #Gross hematuria, bladder mass vs hematoma on CT/RUS - UTI vs bladder stretch injury vs occult malignancy (cannot rule out) - 22Fr 3-way in place, 3rd port capped. Continue for 10-14 days -Urine has nearly cleared.  Lightly blood-tinged in tubing. - Hold AC - Outpatient work-up for gross hematuria including CTU, cystoscopy - ABX per primary, NGTD on culture. No indication for further antibiotic treatment at this time.    #Acute renal failure- resolved #Bilateral hydronephrosis - Serum creatinine 8.4 - Likely postrenal in the setting of acute urinary retention - As above, continue foley catheter to drainage x10-14 days - Mild hydro noted on CT, renal ultrasound collected shortly thereafter showed no hydro bilaterally.  Will not need to rescan. - Continue to trend Cr, strictly monitor I/Os  Ok to discharge at the discretion of primary team. Pt has an appt next month but we would like her to see [place] in clinic in about 10 days.      LOS: 3 days   Kristen Escobar Kristen Escobar 09/02/2022, 1:26 PM

## 2022-09-02 NOTE — TOC Progression Note (Addendum)
Transition of Care [facility]) - Progression Note    Patient Details  Name: Kristen Escobar MRN: [id number] Date of Birth: [birth year]  [facility] CM/SW Contact  Conswella Bruney, Olegario Messier, RN Phone Number: 09/02/2022, 12:58 PM  Clinical Narrative:  [facility] active w/HHPT/OT/aide-rep Merry Proud following.      Expected Discharge Plan: Home w Home Health Services Barriers to Discharge: Continued Medical Work up  Expected Discharge Plan and Services   Discharge Planning Services: CM Consult Post Acute Care Choice: Home Health Living arrangements for the past 2 months: Single Family Home                           HH Arranged: PT, OT HH Agency: [facility] Date [facility] Agency Contacted: 09/02/22 Time HH Agency Contacted: [id number] Representative spoke with at [facility] Agency: Brandi   Social Determinants of Health (SDOH) Interventions SDOH Screenings   Food Insecurity: No Food Insecurity (09/01/2022)  Housing: Low Risk  (09/01/2022)  Transportation Needs: No Transportation Needs (09/01/2022)  Utilities: Not At Risk (09/01/2022)  Alcohol Screen: Low Risk  (10/10/2021)  Depression (PHQ2-9): Low Risk  (07/22/2022)  Financial Resource Strain: Low Risk  (10/10/2021)  Physical Activity: Sufficiently Active (10/10/2021)  Social Connections: Unknown (10/10/2021)  Stress: No Stress Concern Present (10/10/2021)  Tobacco Use: Medium Risk (08/30/2022)    Readmission Risk Interventions     No data to display

## 2022-09-02 NOTE — Progress Notes (Addendum)
Patient states that she have been having incontinent episode of stool when moving in bed or standing since admitted with urine (hematuria), abdominal pain  and back pain. She is concerned. Will make oncoming nurse aware. Encourage patient to share with attending MD today.

## 2022-09-03 DIAGNOSIS — N179 Acute kidney failure, unspecified: Secondary | ICD-10-CM | POA: Diagnosis not present

## 2022-09-03 LAB — CBC
HCT: 28.1 % — ABNORMAL LOW (ref 36.0–46.0)
Hemoglobin: 8.9 g/dL — ABNORMAL LOW (ref 12.0–15.0)
MCH: 28.3 pg (ref 26.0–34.0)
MCHC: 31.7 g/dL (ref 30.0–36.0)
MCV: 89.5 fL (ref 80.0–100.0)
Platelets: 363 10*3/uL (ref 150–400)
RBC: 3.14 MIL/uL — ABNORMAL LOW (ref 3.87–5.11)
RDW: 18.1 % — ABNORMAL HIGH (ref 11.5–15.5)
WBC: 10.1 10*3/uL (ref 4.0–10.5)
nRBC: 0 % (ref 0.0–0.2)

## 2022-09-03 LAB — MAGNESIUM: Magnesium: 1.6 mg/dL — ABNORMAL LOW (ref 1.7–2.4)

## 2022-09-03 MED ORDER — ASPIRIN 81 MG PO TBEC: 81.0000 mg | DELAYED_RELEASE_TABLET | Freq: Two times a day (BID) | ORAL | 12 refills | Status: DC

## 2022-09-03 MED ORDER — FOLIC ACID 1 MG PO TABS
1.0000 mg | ORAL_TABLET | Freq: Every day | ORAL | Status: DC
  Administered 2022-09-03: 1 mg via ORAL
  Filled 2022-09-03: qty 1

## 2022-09-03 MED ORDER — FERROUS SULFATE 325 (65 FE) MG PO TABS
325.0000 mg | ORAL_TABLET | Freq: Every day | ORAL | Status: DC
  Administered 2022-09-03: 325 mg via ORAL
  Filled 2022-09-03: qty 1

## 2022-09-03 MED ORDER — FERROUS SULFATE 325 (65 FE) MG PO TABS: 325.0000 mg | ORAL_TABLET | Freq: Every day | ORAL | 1 refills | Status: DC

## 2022-09-03 MED ORDER — MAGNESIUM OXIDE -MG SUPPLEMENT 400 (240 MG) MG PO TABS: 200.0000 mg | ORAL_TABLET | Freq: Two times a day (BID) | ORAL | 0 refills | Status: DC

## 2022-09-03 MED ORDER — DOCUSATE SODIUM 100 MG PO CAPS: 100.0000 mg | ORAL_CAPSULE | Freq: Two times a day (BID) | ORAL | 0 refills | Status: DC | PRN

## 2022-09-03 MED ORDER — MAGNESIUM SULFATE 2 GM/50ML IV SOLN
2.0000 g | Freq: Once | INTRAVENOUS | Status: AC
  Administered 2022-09-03: 2 g via INTRAVENOUS
  Filled 2022-09-03: qty 50

## 2022-09-03 MED ORDER — MAGNESIUM OXIDE -MG SUPPLEMENT 400 (240 MG) MG PO TABS
200.0000 mg | ORAL_TABLET | Freq: Two times a day (BID) | ORAL | Status: DC
  Administered 2022-09-03: 200 mg via ORAL
  Filled 2022-09-03: qty 1

## 2022-09-03 MED ORDER — FOLIC ACID 1 MG PO TABS: 1.0000 mg | ORAL_TABLET | Freq: Every day | ORAL | 0 refills | Status: DC

## 2022-09-03 MED ORDER — POLYETHYLENE GLYCOL 3350 17 G PO PACK: 17.0000 g | PACK | Freq: Every day | ORAL | 0 refills | Status: AC | PRN

## 2022-09-03 NOTE — Plan of Care (Signed)

## 2022-09-03 NOTE — Discharge Summary (Signed)
Physician Discharge Summary   Patient: Kristen Escobar MRN: [id number] DOB: August 06, 1945  Admit date:     08/30/2022  Discharge date: 09/03/22  Discharge Physician: Alba Cory   PCP: Kristian Covey, MD   Recommendations at discharge:   Needs to follow up with Urology for voiding trial and further evaluation of hematuria, she will need cystoscopy at some point.  Needs CBC on 5/17 to monitor hb.  Needs to follow up with Ortho post sx.   Discharge Diagnoses: Principal Problem:   Acute renal failure (HCC) Active Problems:   Essential hypertension   Clot retention of urine   Urinary retention  Resolved Problems:   * No resolved hospital problems. *  Hospital Course: 77 year old female with recent right ORIF femur fracture status postrepair, history of breast cancer, hypertension, hypothyroidism presented to the hospital for hematuria that is started 2 days prior to admission.  She was recently started on Eliquis post op for DVT prophylaxis.  Evaluation in the ED patient was found to be in neck failure with a creatinine of 8.6 hyponatremia sodium 119, hemoglobin 7.1.  CT abdomen and pelvis showed bladder wall thickening with surrounding inflammation worrisome for cystitis.  Enterocutaneous hyperdensity in the anterior right bladder may represent hematoma or mass.  A small focus of air in the bladder may be related to recent instrumentation.  Mild bilateral hydroureteronephrosis to the level of the bladder.  Urology was consulted on 5/11, three-way Foley catheter was placed.  Eliquis was discontinued.  Patient was a started on antibiotics treatment empirically for UTI.  Subsequently antibiotics discontinued after urine cultures returned negative. Hematuria has improved, hemoglobin has remained stable.  Plan to discharge with Foley catheter.  She will need a voiding trial in 10 days.  She will need to follow-up with urology for cystoscopy.  Assessment and Plan: No notes have been filed  under this hospital service. Service: Hospitalist  1-Acute renal failure, metabolic acidosis, hematuria urinary retention with hydronephrosis -Acute renal failure in the setting of poor oral intake and urine retention.-Catheter was placed, patient was treated with IV fluids.  Renal function has returned to baseline.  Creatinine peaked at 8. Cr down to 0.8 -Urine culture no growth, she was treated initially empirically with ceftriaxone.  Urology recommend discontinue antibiotics. -She will need voiding trial with urology in 10 days.   2-Anemia due to Acute blood loss.  Patient hemoglobin dropped to 6.  Received 2 units of packed red blood cell. Hb increased to 8---8.9 Discharge on ferrous sulfate and folic acid.   3-Right femur fracture.  -S/P ORIF repair 4/30 [facility].  -She follow up with Dr Roswell Miners. Suture will be remove.  -started on Baby aspirin for DVT prophylaxis.  -continue with NWB.   4-Hypomagnesemia; replaced IV prior to discharge.  Replete orally.  Hypothyroidism; continue with synthroid.  HTN on Norvasc.       Consultants: Urology  Procedures performed: none Disposition: Home Diet recommendation:  Discharge Diet Orders (From admission, onward)     Start     Ordered   09/03/22 0000  Diet - low sodium heart healthy        09/03/22 0905           Cardiac diet DISCHARGE MEDICATION: Allergies as of 09/03/2022   No Known Allergies      Medication List     STOP taking these medications    apixaban 2.5 MG Tabs tablet Commonly known as: ELIQUIS   losartan-hydrochlorothiazide 50-12.5 MG tablet  Commonly known as: HYZAAR   pramipexole 0.5 MG tablet Commonly known as: MIRAPEX       TAKE these medications    ADULT GUMMY PO Take 2 tablets by mouth daily. Woman's Gummy   amLODipine 10 MG tablet Commonly known as: NORVASC Take 1 tablet (10 mg total) by mouth daily.   aspirin EC 81 MG tablet Take 1 tablet (81 mg total) by mouth 2 (two) times  daily. Swallow whole.   docusate sodium 100 MG capsule Commonly known as: COLACE Take 1 capsule (100 mg total) by mouth 2 (two) times daily as needed for mild constipation.   ferrous sulfate 325 (65 FE) MG tablet Take 1 tablet (325 mg total) by mouth daily with breakfast. Start taking on: Sep 04, 2022   folic acid 1 MG tablet Commonly known as: FOLVITE Take 1 tablet (1 mg total) by mouth daily.   levothyroxine 75 MCG tablet Commonly known as: SYNTHROID Take 1 tablet (75 mcg total) by mouth daily.   omeprazole 20 MG capsule Commonly known as: PRILOSEC Take 1 capsule (20 mg total) by mouth daily.   polyethylene glycol 17 g packet Commonly known as: MIRALAX / GLYCOLAX Take 17 g by mouth daily as needed for moderate constipation.        Follow-up Information     [facility] Follow up.   Specialty: Home Health Services Why: [facility] physical/occupational therapy;aide Contact information: [address] [place] [address] [phone]                Discharge Exam: Ceasar Mons Weights   09/01/22 0500 09/02/22 0500 09/03/22 0500  Weight: 85.5 kg 84.2 kg 83.1 kg   General; NAD  Condition at discharge: stable  The results of significant diagnostics from this hospitalization (including imaging, microbiology, ancillary and laboratory) are listed below for reference.   Imaging Studies: DG Knee 1-2 Views Right  Result Date: 09/01/2022 CLINICAL DATA:  History of right femoral fracture. Patient states surgery was done in [facility]. EXAM: RIGHT KNEE - 1-2 VIEW COMPARISON:  None Available. FINDINGS: Postsurgical changes with subcutaneous emphysema and multiple surgical clips about the anterior aspect of the knee joint. Status post right knee arthroplasty. Long-stem intramedullary nail partially imaged with recent periprosthetic fracture with plate and screw fixation about the lateral aspect of the knee. IMPRESSION: Status post right knee  arthroplasty. Long-stem intramedullary nail partially imaged with recent periprosthetic fracture and plate and screw fixation. Electronically Signed   By: Larose Hires D.O.   On: 09/01/2022 10:46   US RENAL  Result Date: 08/30/2022 CLINICAL DATA:  Acute renal injury EXAM: RENAL / URINARY TRACT ULTRASOUND COMPLETE COMPARISON:  None Available. FINDINGS: Right Kidney: Renal measurements: 11.3 x 5.8 x 5.1 cm = volume: 174 mL. Echogenicity within normal limits. No mass or hydronephrosis visualized. Left Kidney: Renal measurements: 11.1 x 4.9 x 5.1 cm = volume: 145.7 mL. Echogenicity within normal limits. No mass or hydronephrosis visualized. Bladder: High-density material along the RIGHT wall the bladder measuring 3.8 x 2.9 x 4.4 cm and corresponds to high-density material on comparison CT. Other: None. IMPRESSION: 1. Normal kidneys. 2. High-density tissue along the RIGHT margin of the bladder corresponds to high-density tissue on comparison CT. Findings remain concerning for hematoma versus mass. Electronically Signed   By: Genevive Bi M.D.   On: 08/30/2022 17:53   DG Chest 2 View  Result Date: 08/30/2022 CLINICAL DATA:  Admission EXAM: CHEST - 2 VIEW COMPARISON:  Radiograph  03/05/2017, CT 11/16/2019 FINDINGS: Unchanged cardiomediastinal silhouette. There is no focal airspace consolidation. There is no large effusion or evidence of pneumothorax. Unchanged emphysema. Unchanged reticulation in the right apex. Axillary surgical clips. Thoracic spondylosis. Left shoulder degenerative changes. Mild thoracic spondylosis. IMPRESSION: No evidence of acute cardiopulmonary disease. 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 Electronically Signed   By: Caprice Renshaw M.D.   On: 08/30/2022 16:48   CT ABDOMEN PELVIS WO CONTRAST  Result Date: 08/30/2022 CLINICAL DATA:  Acute abdominal pain EXAM: CT ABDOMEN AND PELVIS WITHOUT CONTRAST TECHNIQUE: Multidetector CT imaging of the abdomen and pelvis was  performed following the standard protocol without IV contrast. RADIATION DOSE REDUCTION: This exam was performed according to the departmental dose-optimization program which includes automated exposure control, adjustment of the mA and/or kV according to patient size and/or use of iterative reconstruction technique. COMPARISON:  None Available. FINDINGS: Lower chest: No acute abnormality. Hepatobiliary: Gallstones are present. There is no biliary ductal dilatation. The liver is within normal limits. Pancreas: Unremarkable. No pancreatic ductal dilatation or surrounding inflammatory changes. Spleen: Normal in size without focal abnormality. Adrenals/Urinary Tract: There is mild bilateral hydroureteronephrosis to the level of the bladder. No obstructing calculi are seen. There is renal cortical scarring bilaterally. The adrenal glands are within normal limits. The bladder is distended. There is a small focus of air in the bladder. There is mild inflammatory stranding and wall thickening of the anterior wall of the bladder. There is heterogeneous hyperdensity in the anterior right bladder measuring proximally 3.0 x 7.0 by 3.0 cm. Stomach/Bowel: Small hiatal hernia is present. Stomach is otherwise within normal limits. Appendix appears normal. No evidence of bowel wall thickening, distention, or inflammatory changes. There is sigmoid colon diverticulosis. Vascular/Lymphatic: Aortic atherosclerosis. No enlarged abdominal or pelvic lymph nodes. Reproductive: Uterus and bilateral adnexa are unremarkable. Other: No abdominal wall hernia or abnormality. No abdominopelvic ascites. Musculoskeletal: Degenerative changes affect the spine. There is nonspecific subcutaneous lumbar edema. Bilateral hip arthroplasties are present. IMPRESSION: 1. Bladder wall thickening with surrounding inflammation worrisome for cystitis. 2. Heterogeneous hyperdensity in the anterior right bladder may represent hematoma or mass. Recommend further  evaluation with cystoscopy. 3. Small focus of air in the bladder may be related to recent instrumentation. 4. Mild bilateral hydroureteronephrosis to the level of the bladder. No obstructing calculi are seen. 5. Cholelithiasis. Aortic Atherosclerosis (ICD10-I70.0). Electronically Signed   By: Darliss Cheney M.D.   On: 08/30/2022 16:46   MM 3D SCREENING MAMMOGRAM BILATERAL BREAST  Result Date: 08/13/2022 CLINICAL DATA:  Screening. EXAM: DIGITAL SCREENING BILATERAL MAMMOGRAM WITH TOMOSYNTHESIS AND CAD TECHNIQUE: Bilateral screening digital craniocaudal and mediolateral oblique mammograms were obtained. Bilateral screening digital breast tomosynthesis was performed. The images were evaluated with computer-aided detection. COMPARISON:  Previous exam(s). ACR Breast Density Category b: There are scattered areas of fibroglandular density. FINDINGS: There are no findings suspicious for malignancy. IMPRESSION: No mammographic evidence of malignancy. A result letter of this screening mammogram will be mailed directly to the patient. RECOMMENDATION: Screening mammogram in one year. (Code:SM-B-01Y) BI-RADS CATEGORY  1: Negative. Electronically Signed   By: Edwin Cap M.D.   On: 08/13/2022 13:07    Microbiology: Results for orders placed or performed during the hospital encounter of 08/30/22  Urine Culture     Status: None   Collection Time: 08/30/22  2:00 PM   Specimen: In/Out Cath Urine  Result Value Ref Range Status   Specimen Description   Final    IN/OUT CATH URINE Performed at [facility], [address] [place], [place] [address]    Special Requests   Final    NONE Performed at [facility] [address], [place], [place] [address]    Culture   Final    NO GROWTH Performed at [facility], [address] [place], [place] [address]    Report Status 08/31/2022 FINAL  Final  MRSA Next Gen by PCR, Nasal     Status: None   Collection Time: 08/30/22   5:46 PM   Specimen: Nasal Mucosa; Nasal Swab  Result Value Ref Range Status   MRSA by PCR Next Gen NOT DETECTED NOT DETECTED Final    Comment: (NOTE) The GeneXpert MRSA Assay (FDA approved for NASAL specimens only), is one component of a comprehensive MRSA colonization surveillance program. It is not intended to diagnose MRSA infection nor to guide or monitor treatment for MRSA infections. Test performance is not FDA approved in patients less than 2 years old. Performed at [facility], [address], [place], [place] [address]     Labs: CBC: Recent Labs  Lab 08/30/22 1604 08/30/22 1710 08/31/22 0056 08/31/22 0835 09/01/22 0431 09/03/22 0458  WBC 13.6* 12.7* 9.6 10.0 11.6* 10.1  NEUTROABS 11.0*  --   --   --  8.4*  --   HGB 7.1* 6.4* 6.9* 8.4* 8.0* 8.9*  HCT 22.5* 19.7* 21.3* 26.1* 25.0* 28.1*  MCV 84.9 84.5 85.2 87.0 87.4 89.5  PLT 485* 379 359 397 388 363   Basic Metabolic Panel: Recent Labs  Lab 08/31/22 0056 08/31/22 0835 08/31/22 1452 09/01/22 0431 09/02/22 0511  NA 136 139 136 134* 137  K 4.4 3.9 4.2 4.2 4.6  CL 106 108 108 107 109  CO2 18* 19* 20* 20* 22  GLUCOSE 72 89 120* 79 88  BUN 103* 82* 61* 31* 14  CREATININE 4.38* 2.63* 1.83* 1.04* 0.84  CALCIUM 8.3* 8.2* 8.1* 7.8* 8.3*  MG  --   --   --  1.6* 1.7  PHOS  --   --   --  2.5  --    Liver Function Tests: Recent Labs  Lab 08/30/22 1330  AST 33  ALT 6  ALKPHOS 65  BILITOT 0.9  PROT 7.1  ALBUMIN 3.0*   CBG: No results for input(s): "GLUCAP" in the last 168 hours.  Discharge time spent: greater than 30 minutes.  Signed: Alba Cory, MD [facility] 09/03/2022

## 2022-09-03 NOTE — Discharge Instructions (Signed)
Drink plenty of fluid

## 2022-09-03 NOTE — TOC Transition Note (Signed)
Transition of Care [facility]) - CM/SW Discharge Note   Patient Details  Name: Kristen Escobar MRN: [id number] Date of Birth: Jul 12, 1945  Transition of Care [facility]) CM/SW Contact:  Lanier Clam, RN Phone Number: 09/03/2022, 12:03 PM   Clinical Narrative: d/c home w/[facility]abs,HHPT/OT. Has own transport home. No further CM needs.      Final next level of care: Home w Home Health Services Barriers to Discharge: No Barriers Identified   Patient Goals and CMS Choice CMS Medicare.gov Compare Post Acute Care list provided to:: Patient Represenative (must comment) ([name](spouse)) Choice offered to / list presented to : Spouse  Discharge Placement                         Discharge Plan and Services Additional resources added to the After Visit Summary for     Discharge Planning Services: CM Consult Post Acute Care Choice: Home Health                    HH Arranged: RN [facility] Agency: [facility] Date [facility] Agency Contacted: 09/02/22 Time HH Agency Contacted: [phone] Representative spoke with at [facility] Agency: Merry Proud  Social Determinants of Health (SDOH) Interventions SDOH Screenings   Food Insecurity: No Food Insecurity (09/01/2022)  Housing: Low Risk  (09/01/2022)  Transportation Needs: No Transportation Needs (09/01/2022)  Utilities: Not At Risk (09/01/2022)  Alcohol Screen: Low Risk  (10/10/2021)  Depression (PHQ2-9): Low Risk  (07/22/2022)  Financial Resource Strain: Low Risk  (10/10/2021)  Physical Activity: Sufficiently Active (10/10/2021)  Social Connections: Unknown (10/10/2021)  Stress: No Stress Concern Present (10/10/2021)  Tobacco Use: Medium Risk (08/30/2022)     Readmission Risk Interventions     No data to display

## 2022-09-03 NOTE — Progress Notes (Addendum)
  Subjective: NAEON. Met with wife and husband again. Reviewed reasons to reach out to the clinic or hospital.   Objective: Vital signs in last 24 hours: Temp:  [98 F (36.7 C)-98.5 F (36.9 C)] 98.5 F (36.9 C) (05/15 0544) Pulse Rate:  [81-85] 81 (05/15 0544) Resp:  [20] 20 (05/15 0544) BP: (115-136)/(54-65) 129/54 (05/15 0956) SpO2:  [96 %-98 %] 96 % (05/15 0544) Weight:  [83.1 kg] 83.1 kg (05/15 0500)  Intake/Output from previous day: 05/14 0701 - 05/15 0700 In: 120 [P.O.:120] Out: 2400 [Urine:2400] Intake/Output this shift: No intake/output data recorded.  Physical Exam:  General: Alert and oriented CV: RRR Lungs: Clear Abdomen: Soft, ND GU: 22Fr 3-way in place, urine in tubing is lightly blood-tinged with no clot accumulation noted. Ext: NT, No erythema  Lab Results: Recent Labs    09/01/22 0431 09/03/22 0458  HGB 8.0* 8.9*  HCT 25.0* 28.1*   BMET Recent Labs    09/01/22 0431 09/02/22 0511  NA 134* 137  K 4.2 4.6  CL 107 109  CO2 20* 22  GLUCOSE 79 88  BUN 31* 14  CREATININE 1.04* 0.84  CALCIUM 7.8* 8.3*     Studies/Results: No results found.  Assessment/Plan: #Gross hematuria, bladder mass vs hematoma on CT/RUS - UTI vs bladder stretch injury vs occult malignancy (cannot rule out) - 22Fr 3-way in place, 3rd port capped. Continue for 10-14 days - Urine still lightly blood-tinged in tubing. Little change from yesterday - Hold AC if possible. - Outpatient work-up for gross hematuria including CTU, cystoscopy - ABX per primary, NGTD on culture. No indication for further antibiotic treatment at this time.    #Acute renal failure- resolved #Bilateral hydronephrosis - Serum creatinine 0.84 - Likely postrenal in the setting of acute urinary retention - As above, continue foley catheter to drainage x10-14 days - Mild hydro noted on CT, renal ultrasound collected shortly thereafter showed no hydro bilaterally.  Will not need to rescan. - Continue  to trend Cr, strictly monitor I/Os  -right femur fracture repair 4/30- staples out today  No change to plan today. Hematuria is stable and persistent. Pt has an appt next month but we would like her to see [place] in clinic in about 10 days.      LOS: 4 days   Scherrie Bateman Kirsten Spearing 09/03/2022, 10:48 AM

## 2022-09-03 NOTE — Progress Notes (Signed)
Subjective:    Patient reports pain as mild.   Patient seen in rounds with Dr. Lequita Halt. Patient is doing well this AM, states she is ready to go home. Pain remains well controlled in the right LE.  Objective: Vital signs in last 24 hours: Temp:  [98 F (36.7 C)-98.5 F (36.9 C)] 98.5 F (36.9 C) (05/15 0544) Pulse Rate:  [81-85] 81 (05/15 0544) Resp:  [20] 20 (05/15 0544) BP: (115-148)/(58-65) 136/58 (05/15 0544) SpO2:  [96 %-98 %] 96 % (05/15 0544) Weight:  [83.1 kg] 83.1 kg (05/15 0500)  Intake/Output from previous day:  Intake/Output Summary (Last 24 hours) at 09/03/2022 0825 Last data filed at 09/03/2022 0552 Gross per 24 hour  Intake 120 ml  Output 2400 ml  Net -2280 ml    Intake/Output this shift: No intake/output data recorded.  Labs: Recent Labs    08/31/22 0835 09/01/22 0431 09/03/22 0458  HGB 8.4* 8.0* 8.9*   Recent Labs    09/01/22 0431 09/03/22 0458  WBC 11.6* 10.1  RBC 2.86* 3.14*  HCT 25.0* 28.1*  PLT 388 363   Recent Labs    09/01/22 0431 09/02/22 0511  NA 134* 137  K 4.2 4.6  CL 107 109  CO2 20* 22  BUN 31* 14  CREATININE 1.04* 0.84  GLUCOSE 79 88  CALCIUM 7.8* 8.3*   No results for input(s): "LABPT", "INR" in the last 72 hours.  Exam: General - Patient is Alert and Oriented Extremity - Neurologically intact Neurovascular intact Sensation intact distally Dorsiflexion/Plantar flexion intact Dressing/Incision - clean, dry, no drainage. Staples removed, no drainage or erythema.  Motor Function - intact, moving foot and toes well on exam.   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   Diffuse large B cell lymphoma (HCC) dx'd 02/17/17   in remission 2 years now   had chemo    GERD (gastroesophageal reflux  disease)    Heart murmur    07/03/21 echo: LVEF 60-65%, grade I DD, normal PASP, trivial MR, mild AI   History of radiation therapy 07/12/10,completed   right breast 60 Gy x30 fx   Hypertension    no meds currently   Hypothyroidism    Lymphoma (HCC)    Obesity    PE (pulmonary thromboembolism) (HCC) 01/2017   in setting of chemotherapy for lymphoma   Peripheral vascular disease (HCC) 1995   PT DEVELOPED CIRCULATION PROBLEMS IN BOTH HANDS AND GANGRENE OF BOTH INDEX FINGERS--REQUIRING AMPUTATION OF THE INDEX FINGERS AND VASCULAR SURGERY.  PT TOLD HER PROBLEMS RELATED TO SMOKING.   NO OTHER PROBLEMS SINCE.   Personal history of radiation therapy 2012   Right Breast Cancer   Pneumonia    Thyroid mass    Vocal cord paralysis    left    Assessment/Plan:    Principal Problem:   Acute renal failure (HCC) Active Problems:   Essential hypertension   Clot retention of urine   Urinary retention  Estimated body mass index is 33.51 kg/m as calculated from the following:   Height as of this encounter: 5\' 2"  (1.575 m).   Weight as of this encounter: 83.1 kg.  DVT Prophylaxis - Aspirin NWB to the RLE  Staples removed today, incisions are healing well.  Will discharge on ASA 81 mg BID for an additional week, then will reduce this to 81 mg ASA QD for three weeks. This was discussed with patient's daughter.  Will follow-up with [place] in 4 weeks in clinic, can cancel original appointment tomorrow.   Arther Abbott, PA-C Orthopedic Surgery [phone] 09/03/2022, 8:25 AM

## 2022-09-04 ENCOUNTER — Telehealth: Payer: Self-pay

## 2022-09-04 DIAGNOSIS — S7291XD Unspecified fracture of right femur, subsequent encounter for closed fracture with routine healing: Secondary | ICD-10-CM | POA: Diagnosis not present

## 2022-09-04 DIAGNOSIS — K219 Gastro-esophageal reflux disease without esophagitis: Secondary | ICD-10-CM | POA: Diagnosis not present

## 2022-09-04 DIAGNOSIS — I129 Hypertensive chronic kidney disease with stage 1 through stage 4 chronic kidney disease, or unspecified chronic kidney disease: Secondary | ICD-10-CM | POA: Diagnosis not present

## 2022-09-04 DIAGNOSIS — M9711XD Periprosthetic fracture around internal prosthetic right knee joint, subsequent encounter: Secondary | ICD-10-CM | POA: Diagnosis not present

## 2022-09-04 DIAGNOSIS — N189 Chronic kidney disease, unspecified: Secondary | ICD-10-CM | POA: Diagnosis not present

## 2022-09-04 DIAGNOSIS — E1122 Type 2 diabetes mellitus with diabetic chronic kidney disease: Secondary | ICD-10-CM | POA: Diagnosis not present

## 2022-09-04 NOTE — Transitions of Care (Post Inpatient/ED Visit) (Signed)
09/04/2022  Name: Kristen Escobar MRN: [id number] DOB: December 15, 1945  Today's TOC FU Call Status: Today's TOC FU Call Status:: Successful TOC FU Call Competed TOC FU Call Complete Date: 09/04/22  Transition Care Management Follow-up Telephone Call Date of Discharge: 09/03/22 Discharge Facility: [facility]) Type of Discharge: Inpatient Admission Primary Inpatient Discharge Diagnosis:: "ARF" How have you been since you were released from the hospital?: Better (Spoke w/ spouse-states pt is "doing fairly well-urine last night was dark yellow but looks better today." he is managing foley catheter.) Any questions or concerns?: Yes Patient Questions/Concerns:: Pt feels like foley cath may have slightly 'slid out of place as extra ports look lower" Patient Questions/Concerns Addressed: Other: (Discussed cath mgmt- pt confims she does not feel/have any urinary leakage around catheter or noted in underwear. Spouse states urine draining to bag. HHRN coming out shortly and they will discuss with her and have her assess foley.)  Items Reviewed: Did you receive and understand the discharge instructions provided?: Yes Medications obtained,verified, and reconciled?: Yes (Medications Reviewed) Any new allergies since your discharge?: No Dietary orders reviewed?: Yes Type of Diet Ordered:: low salt/heart healthy Do you have support at home?: Yes People in Home: spouse Name of Support/Comfort Primary Source: Molly Maduro  Medications Reviewed Today: Medications Reviewed Today     Reviewed by Charlyn Minerva, RN (Registered Nurse) on 09/04/22 at 1446  Med List Status: <None>   Medication Order Taking? Sig Documenting Provider Last Dose Status Informant  amLODipine (NORVASC) 10 MG tablet [id number] No Take 1 tablet (10 mg total) by mouth daily. Kristian Covey, MD 08/30/2022 Active Self, Pharmacy Records  aspirin EC 81 MG tablet [id number]  Take 1 tablet (81 mg total) by mouth 2 (two) times  daily. Swallow whole. Regalado, Belkys A, MD  Active   docusate sodium (COLACE) 100 MG capsule [id number]  Take 1 capsule (100 mg total) by mouth 2 (two) times daily as needed for mild constipation. Regalado, Belkys A, MD  Active   ferrous sulfate 325 (65 FE) MG tablet [id number]  Take 1 tablet (325 mg total) by mouth daily with breakfast. Regalado, Belkys A, MD  Active   folic acid (FOLVITE) 1 MG tablet [id number]  Take 1 tablet (1 mg total) by mouth daily. Regalado, Belkys A, MD  Active   levothyroxine (SYNTHROID) 75 MCG tablet [id number] No Take 1 tablet (75 mcg total) by mouth daily. Kristian Covey, MD 08/30/2022 Active Self, Pharmacy Records  magnesium oxide (MAG-OX) 400 (240 Mg) MG tablet [id number]  Take 0.5 tablets (200 mg total) by mouth 2 (two) times daily. Regalado, Belkys A, MD  Active   Multiple Vitamins-Minerals (ADULT GUMMY PO) [id number] No Take 2 tablets by mouth daily. [employer]'s Gummy [provider] 08/30/2022 Active Self, Pharmacy Records  omeprazole (PRILOSEC) 20 MG capsule [id number] No Take 1 capsule (20 mg total) by mouth daily. Kristian Covey, MD 08/30/2022 Active Self, Pharmacy Records  polyethylene glycol (MIRALAX / GLYCOLAX) 17 g packet [id number]  Take 17 g by mouth daily as needed for moderate constipation. Alba Cory, MD  Active             Home Care and Equipment/Supplies: Were Home Health Services Ordered?: Yes Name of Home Health Agency:: [facility] Has Agency set up a time to come to your home?: Yes First Home Health Visit Date: 09/04/22 Any new equipment or medical supplies ordered?: No  Functional Questionnaire: Do you need assistance with bathing/showering or dressing?: Yes Do you need  assistance with meal preparation?: Yes Do you need assistance with eating?: No Do you have difficulty maintaining continence: No Do you need assistance with getting out of bed/getting out of a chair/moving?: Yes Do you have difficulty managing or taking  your medications?: No  Follow up appointments reviewed: PCP Follow-up appointment confirmed?: No (Spouse voices difficult to get pt out of home given recent ortho surgery-he will call at later time to make follow up appt-discussed need for repeat CBC in 3days per d/c summary-he has discussed with [facility] and HHRN to draw labs in the home) MD Provider Line [phone] Given: No Specialist Hospital Follow-up appointment confirmed?: No Reason Specialist Follow-Up Not Confirmed: Patient has Specialist Provider Number and will Call for Appointment (Confirmed spouse has contact info for urology and ortho and will follow up to make appts) Do you need transportation to your follow-up appointment?: No Do you understand care options if your condition(s) worsen?: Yes-patient verbalized understanding  SDOH Interventions Today    Flowsheet Row Most Recent Value  SDOH Interventions   Food Insecurity Interventions Intervention Not Indicated  Transportation Interventions Intervention Not Indicated      TOC Interventions Today    Flowsheet Row Most Recent Value  TOC Interventions   TOC Interventions Discussed/Reviewed TOC Interventions Discussed, Post discharge activity limitations per provider, Post op wound/incision care      Interventions Today    Flowsheet Row Most Recent Value  General Interventions   General Interventions Discussed/Reviewed General Interventions Discussed, Doctor Visits  Doctor Visits Discussed/Reviewed Doctor Visits Discussed, PCP, Specialist  PCP/Specialist Visits Compliance with follow-up visit  Education Interventions   Education Provided Provided Education  Provided Verbal Education On Nutrition, When to see the doctor, Medication, Other  [foley cath mgmt]  Nutrition Interventions   Nutrition Discussed/Reviewed Nutrition Discussed, Adding fruits and vegetables, Decreasing salt  Pharmacy Interventions   Pharmacy Dicussed/Reviewed Pharmacy Topics Discussed,  Medications and their functions  Safety Interventions   Safety Discussed/Reviewed Safety Discussed, Fall Risk, Home Safety  Home Safety Assistive Devices  [spouse confirms pt has all needed DME in the home-primarily using w/c]       Antionette Fairy, RN,BSN,CCM [facility] Care Management Care Management Community Coordinator Direct Phone: [phone] Toll Free: [phone] Fax: [phone]

## 2022-09-08 ENCOUNTER — Emergency Department (HOSPITAL_COMMUNITY)
Admission: EM | Admit: 2022-09-08 | Discharge: 2022-09-08 | Disposition: A | Discharge status: Home / Self Care | Payer: Medicare Other | Attending: Emergency Medicine | Admitting: Emergency Medicine

## 2022-09-08 ENCOUNTER — Telehealth: Payer: Self-pay | Admitting: Family Medicine

## 2022-09-08 DIAGNOSIS — I129 Hypertensive chronic kidney disease with stage 1 through stage 4 chronic kidney disease, or unspecified chronic kidney disease: Secondary | ICD-10-CM | POA: Diagnosis not present

## 2022-09-08 DIAGNOSIS — E039 Hypothyroidism, unspecified: Secondary | ICD-10-CM | POA: Diagnosis not present

## 2022-09-08 DIAGNOSIS — N189 Chronic kidney disease, unspecified: Secondary | ICD-10-CM | POA: Insufficient documentation

## 2022-09-08 DIAGNOSIS — K219 Gastro-esophageal reflux disease without esophagitis: Secondary | ICD-10-CM | POA: Diagnosis not present

## 2022-09-08 DIAGNOSIS — E1122 Type 2 diabetes mellitus with diabetic chronic kidney disease: Secondary | ICD-10-CM | POA: Diagnosis not present

## 2022-09-08 DIAGNOSIS — R319 Hematuria, unspecified: Secondary | ICD-10-CM | POA: Insufficient documentation

## 2022-09-08 DIAGNOSIS — Z79899 Other long term (current) drug therapy: Secondary | ICD-10-CM | POA: Diagnosis not present

## 2022-09-08 DIAGNOSIS — R310 Gross hematuria: Secondary | ICD-10-CM | POA: Diagnosis not present

## 2022-09-08 DIAGNOSIS — R58 Hemorrhage, not elsewhere classified: Secondary | ICD-10-CM | POA: Diagnosis not present

## 2022-09-08 DIAGNOSIS — S7291XD Unspecified fracture of right femur, subsequent encounter for closed fracture with routine healing: Secondary | ICD-10-CM | POA: Diagnosis not present

## 2022-09-08 DIAGNOSIS — M9711XD Periprosthetic fracture around internal prosthetic right knee joint, subsequent encounter: Secondary | ICD-10-CM | POA: Diagnosis not present

## 2022-09-08 LAB — BASIC METABOLIC PANEL
Anion gap: 9 (ref 5–15)
BUN: 28 mg/dL — ABNORMAL HIGH (ref 8–23)
CO2: 19 mmol/L — ABNORMAL LOW (ref 22–32)
Calcium: 8.3 mg/dL — ABNORMAL LOW (ref 8.9–10.3)
Chloride: 106 mmol/L (ref 98–111)
Creatinine, Ser: 0.98 mg/dL (ref 0.44–1.00)
GFR, Estimated: 60 mL/min — ABNORMAL LOW (ref >60)
Glucose, Bld: 93 mg/dL (ref 70–99)
Potassium: 3.8 mmol/L (ref 3.5–5.1)
Sodium: 134 mmol/L — ABNORMAL LOW (ref 135–145)

## 2022-09-08 LAB — CBC
HCT: 27 % — ABNORMAL LOW (ref 36.0–46.0)
Hemoglobin: 8.5 g/dL — ABNORMAL LOW (ref 12.0–15.0)
MCH: 28 pg (ref 26.0–34.0)
MCHC: 31.5 g/dL (ref 30.0–36.0)
MCV: 88.8 fL (ref 80.0–100.0)
Platelets: 359 10*3/uL (ref 150–400)
RBC: 3.04 MIL/uL — ABNORMAL LOW (ref 3.87–5.11)
RDW: 16.5 % — ABNORMAL HIGH (ref 11.5–15.5)
WBC: 9.1 10*3/uL (ref 4.0–10.5)
nRBC: 0 % (ref 0.0–0.2)

## 2022-09-08 NOTE — Telephone Encounter (Signed)
Cara informed of Ok for verbal orders

## 2022-09-08 NOTE — ED Triage Notes (Signed)
Pt BIBA from home. C/o hematuria for over a week. Pt has foley catheter. Output is dark red.  No other complaints.  AOx4

## 2022-09-08 NOTE — Telephone Encounter (Signed)
Requesting to speak with someone for verbal order nursing 1x6, needs CBC. Needs to have this approved today, urgent request. Has secured voicemail for messages

## 2022-09-08 NOTE — ED Provider Notes (Signed)
[place] EMERGENCY DEPARTMENT AT [facility] Provider Note   CSN: [id number] Arrival date & time: 09/08/22  1658     History  Chief Complaint  Patient presents with   Hematuria    Kristen Escobar is a 77 y.o. female.   Hematuria   77 year old female presents emergency department complaints of hematuria.  Patient was recently admitted to the hospital due acute renal failure with urinary retention with Foley catheter placed at that time.  She notes that since discharge on 5/15 has noted some intermittent blood/darker urine appreciable in Foley catheter.  She states that over the past 2 days, bleeding has become more persistent.  Contacted urology office who recommended coming to the emergency department for further assessment.  Denies any abdominal pain, feelings of urinary tension, fever, nausea, vomiting.  States that she feels like her Foley catheter is draining appropriately.  Denies any blood thinner use.  Past medical history significant for bladder mass, PE, CKD, B-cell lymphoma, hypertension, hypothyroidism, obesity,  Home Medications Prior to Admission medications   Medication Sig Start Date End Date Taking? Authorizing Provider  amLODipine (NORVASC) 10 MG tablet Take 1 tablet (10 mg total) by mouth daily. 04/22/22   Burchette, Elberta Fortis, MD  aspirin EC 81 MG tablet Take 1 tablet (81 mg total) by mouth 2 (two) times daily. Swallow whole. 09/03/22   Regalado, Belkys A, MD  docusate sodium (COLACE) 100 MG capsule Take 1 capsule (100 mg total) by mouth 2 (two) times daily as needed for mild constipation. 09/03/22   Regalado, Belkys A, MD  ferrous sulfate 325 (65 FE) MG tablet Take 1 tablet (325 mg total) by mouth daily with breakfast. 09/04/22   Regalado, Belkys A, MD  folic acid (FOLVITE) 1 MG tablet Take 1 tablet (1 mg total) by mouth daily. 09/03/22   Regalado, Belkys A, MD  levothyroxine (SYNTHROID) 75 MCG tablet Take 1 tablet (75 mcg total) by mouth daily. 04/22/22    Burchette, Elberta Fortis, MD  magnesium oxide (MAG-OX) 400 (240 Mg) MG tablet Take 0.5 tablets (200 mg total) by mouth 2 (two) times daily. 09/03/22   Regalado, Belkys A, MD  Multiple Vitamins-Minerals (ADULT GUMMY PO) Take 2 tablets by mouth daily. [employer]'s Gummy    [provider]  omeprazole (PRILOSEC) 20 MG capsule Take 1 capsule (20 mg total) by mouth daily. 04/22/22   Burchette, Elberta Fortis, MD  polyethylene glycol (MIRALAX / GLYCOLAX) 17 g packet Take 17 g by mouth daily as needed for moderate constipation. 09/03/22   Regalado, Prentiss Bells, MD      Allergies    Patient has no known allergies.    Review of Systems   Review of Systems  Genitourinary:  Positive for hematuria.  All other systems reviewed and are negative.   Physical Exam Updated Vital Signs BP (!) 113/55 (BP Location: Right Arm)   Pulse 80   Temp 99 F (37.2 C) (Oral)   Resp 17   SpO2 97%  Physical Exam Vitals and nursing note reviewed.  Constitutional:      General: She is not in acute distress.    Appearance: She is well-developed.  HENT:     Head: Normocephalic and atraumatic.  Eyes:     Conjunctiva/sclera: Conjunctivae normal.  Cardiovascular:     Rate and Rhythm: Normal rate and regular rhythm.  Pulmonary:     Effort: Pulmonary effort is normal. No respiratory distress.     Breath sounds: Normal breath sounds.  Abdominal:  Palpations: Abdomen is soft.     Tenderness: There is no abdominal tenderness.     Comments: No suprapubic tenderness to palpation.  Dark appearing blood appreciable in Foley catheter as well as in bag.  Musculoskeletal:        General: No swelling.     Cervical back: Neck supple.  Skin:    General: Skin is warm and dry.     Capillary Refill: Capillary refill takes less than 2 seconds.  Neurological:     Mental Status: She is alert.  Psychiatric:        Mood and Affect: Mood normal.     ED Results / Procedures / Treatments   Labs (all labs ordered are listed, but only  abnormal results are displayed) Labs Reviewed  BASIC METABOLIC PANEL - Abnormal; Notable for the following components:      Result Value   Sodium 134 (*)    CO2 19 (*)    BUN 28 (*)    Calcium 8.3 (*)    GFR, Estimated 60 (*)    All other components within normal limits  CBC - Abnormal; Notable for the following components:   RBC 3.04 (*)    Hemoglobin 8.5 (*)    HCT 27.0 (*)    RDW 16.5 (*)    All other components within normal limits    EKG None  Radiology No results found.  Procedures Procedures    Medications Ordered in ED Medications - No data to display  ED Course/ Medical Decision Making/ A&P Clinical Course as of 09/08/22 2222  Mon Sep 08, 2022  1901 Consulted urology Dr. Annabell Howells, Jonny Ruiz regarding the patient who agreed with outpatient management of patient's symptoms if able to irrigate and clear some of the blood given that she is not retaining.  Will reach out to staff to try to see patient sooner. [CR]  2025 Patient's catheter was irrigated with 1 L of sterile water by nursing staff and noted significant improvements of noticeable hematuria.  Draining urine now light pink in appearance. [CR]    Clinical Course User Index [CR] Peter Garter, PA                             Medical Decision Making Amount and/or Complexity of Data Reviewed Labs: ordered.   This patient presents to the ED for concern of hematuria, this involves an extensive number of treatment options, and is a complaint that carries with it a high risk of complications and morbidity.  The differential diagnosis includes bladder mass, malignancy, trauma, nephrolithiasis   Co morbidities that complicate the patient evaluation  See HPI   Additional history obtained:  Additional history obtained from EMR External records from outside source obtained and reviewed including hospital records   Lab Tests:  I Ordered, and personally interpreted labs.  The pertinent results include: No  leukocytosis.  Patient with hemoglobin 8.5 which is near patient's baseline.  No thrombocytopenia.  Mild hyponatremia and decreased bicarb of 1 3419 respectively.  Patient with baseline renal function of BUN at 28, creatinine 1.98 and GFR of 60.   Imaging Studies ordered:  N/a   Cardiac Monitoring: / EKG:  The patient was maintained on a cardiac monitor.  I personally viewed and interpreted the cardiac monitored which showed an underlying rhythm of: Sinus rhythm   Consultations Obtained:  See ED course  Problem List / ED Course / Critical interventions / Medication management  Hematuria Reevaluation of the patient showed that the patient stayed the same I have reviewed the patients home medicines and have made adjustments as needed   Social Determinants of Health:  Former cigarette use.  Denies illicit drug use   Test / Admission - Considered:  Hematuria Vitals signs  within normal range and stable throughout visit. Laboratory studies significant for: See above 77 year old female presents emergency department with complaints of hematuria.  Patient without evidence of urinary retention but was concerned due to amount of hematuria that did not improve over the past 2 days.  Presented to the emergency department due to relaying of message by team member at outpatient urology office.  Bladder was irrigated by nursing staff with significant improvement of drainage to a light pink-tinged color.  Urology was consulted regarding the patient and recommended prompt outpatient follow-up.  Treatment plan discussed at length with patient and she acknowledged understanding was agreeable to said plan.  Patient overall well-appearing, afebrile in no acute distress.   Worrisome signs and symptoms were discussed with the patient, and the patient acknowledged understanding to return to the ED if noticed. Patient was stable upon discharge.          Final Clinical Impression(s) / ED  Diagnoses Final diagnoses:  Hematuria, unspecified type    Rx / DC Orders ED Discharge Orders     None         Peter Garter, [place] 09/08/22 2222    Vanetta Mulders, MD 09/09/22 2308

## 2022-09-08 NOTE — Discharge Instructions (Addendum)
The workup today was overall reassuring.  Your hemoglobin or red blood cells were similar to the last time they were drawn.  Your kidney function is near baseline as well.  I talked to Dr. Annabell Howells of urology who reach out to the office to try to get you seen quicker before your appointment this Friday.  Please do not hesitate to return to emergency department if the worrisome signs and symptoms we discussed become apparent.

## 2022-09-10 DIAGNOSIS — R339 Retention of urine, unspecified: Secondary | ICD-10-CM | POA: Diagnosis not present

## 2022-09-10 DIAGNOSIS — R310 Gross hematuria: Secondary | ICD-10-CM | POA: Diagnosis not present

## 2022-09-12 DIAGNOSIS — E1122 Type 2 diabetes mellitus with diabetic chronic kidney disease: Secondary | ICD-10-CM | POA: Diagnosis not present

## 2022-09-12 DIAGNOSIS — S7291XD Unspecified fracture of right femur, subsequent encounter for closed fracture with routine healing: Secondary | ICD-10-CM | POA: Diagnosis not present

## 2022-09-12 DIAGNOSIS — M9711XD Periprosthetic fracture around internal prosthetic right knee joint, subsequent encounter: Secondary | ICD-10-CM | POA: Diagnosis not present

## 2022-09-12 DIAGNOSIS — K219 Gastro-esophageal reflux disease without esophagitis: Secondary | ICD-10-CM | POA: Diagnosis not present

## 2022-09-12 DIAGNOSIS — N189 Chronic kidney disease, unspecified: Secondary | ICD-10-CM | POA: Diagnosis not present

## 2022-09-12 DIAGNOSIS — I129 Hypertensive chronic kidney disease with stage 1 through stage 4 chronic kidney disease, or unspecified chronic kidney disease: Secondary | ICD-10-CM | POA: Diagnosis not present

## 2022-09-16 DIAGNOSIS — S7291XD Unspecified fracture of right femur, subsequent encounter for closed fracture with routine healing: Secondary | ICD-10-CM | POA: Diagnosis not present

## 2022-09-16 DIAGNOSIS — K219 Gastro-esophageal reflux disease without esophagitis: Secondary | ICD-10-CM | POA: Diagnosis not present

## 2022-09-16 DIAGNOSIS — M9711XD Periprosthetic fracture around internal prosthetic right knee joint, subsequent encounter: Secondary | ICD-10-CM | POA: Diagnosis not present

## 2022-09-16 DIAGNOSIS — E1122 Type 2 diabetes mellitus with diabetic chronic kidney disease: Secondary | ICD-10-CM | POA: Diagnosis not present

## 2022-09-16 DIAGNOSIS — I129 Hypertensive chronic kidney disease with stage 1 through stage 4 chronic kidney disease, or unspecified chronic kidney disease: Secondary | ICD-10-CM | POA: Diagnosis not present

## 2022-09-16 DIAGNOSIS — N189 Chronic kidney disease, unspecified: Secondary | ICD-10-CM | POA: Diagnosis not present

## 2022-09-18 DIAGNOSIS — E1122 Type 2 diabetes mellitus with diabetic chronic kidney disease: Secondary | ICD-10-CM | POA: Diagnosis not present

## 2022-09-18 DIAGNOSIS — K219 Gastro-esophageal reflux disease without esophagitis: Secondary | ICD-10-CM | POA: Diagnosis not present

## 2022-09-18 DIAGNOSIS — S7291XD Unspecified fracture of right femur, subsequent encounter for closed fracture with routine healing: Secondary | ICD-10-CM | POA: Diagnosis not present

## 2022-09-18 DIAGNOSIS — M9711XD Periprosthetic fracture around internal prosthetic right knee joint, subsequent encounter: Secondary | ICD-10-CM | POA: Diagnosis not present

## 2022-09-18 DIAGNOSIS — I129 Hypertensive chronic kidney disease with stage 1 through stage 4 chronic kidney disease, or unspecified chronic kidney disease: Secondary | ICD-10-CM | POA: Diagnosis not present

## 2022-09-18 DIAGNOSIS — N189 Chronic kidney disease, unspecified: Secondary | ICD-10-CM | POA: Diagnosis not present

## 2022-09-21 DIAGNOSIS — Z86711 Personal history of pulmonary embolism: Secondary | ICD-10-CM | POA: Diagnosis not present

## 2022-09-21 DIAGNOSIS — R339 Retention of urine, unspecified: Secondary | ICD-10-CM | POA: Diagnosis not present

## 2022-09-21 DIAGNOSIS — Z89021 Acquired absence of right finger(s): Secondary | ICD-10-CM | POA: Diagnosis not present

## 2022-09-21 DIAGNOSIS — M1990 Unspecified osteoarthritis, unspecified site: Secondary | ICD-10-CM | POA: Diagnosis not present

## 2022-09-21 DIAGNOSIS — S72401D Unspecified fracture of lower end of right femur, subsequent encounter for closed fracture with routine healing: Secondary | ICD-10-CM | POA: Diagnosis not present

## 2022-09-21 DIAGNOSIS — C8330 Diffuse large B-cell lymphoma, unspecified site: Secondary | ICD-10-CM | POA: Diagnosis not present

## 2022-09-21 DIAGNOSIS — Z7901 Long term (current) use of anticoagulants: Secondary | ICD-10-CM | POA: Diagnosis not present

## 2022-09-21 DIAGNOSIS — Z993 Dependence on wheelchair: Secondary | ICD-10-CM | POA: Diagnosis not present

## 2022-09-21 DIAGNOSIS — K219 Gastro-esophageal reflux disease without esophagitis: Secondary | ICD-10-CM | POA: Diagnosis not present

## 2022-09-21 DIAGNOSIS — N139 Obstructive and reflux uropathy, unspecified: Secondary | ICD-10-CM | POA: Diagnosis not present

## 2022-09-21 DIAGNOSIS — K5900 Constipation, unspecified: Secondary | ICD-10-CM | POA: Diagnosis not present

## 2022-09-21 DIAGNOSIS — N179 Acute kidney failure, unspecified: Secondary | ICD-10-CM | POA: Diagnosis not present

## 2022-09-21 DIAGNOSIS — Z466 Encounter for fitting and adjustment of urinary device: Secondary | ICD-10-CM | POA: Diagnosis not present

## 2022-09-21 DIAGNOSIS — D62 Acute posthemorrhagic anemia: Secondary | ICD-10-CM | POA: Diagnosis not present

## 2022-09-21 DIAGNOSIS — E1122 Type 2 diabetes mellitus with diabetic chronic kidney disease: Secondary | ICD-10-CM | POA: Diagnosis not present

## 2022-09-21 DIAGNOSIS — J3800 Paralysis of vocal cords and larynx, unspecified: Secondary | ICD-10-CM | POA: Diagnosis not present

## 2022-09-21 DIAGNOSIS — N136 Pyonephrosis: Secondary | ICD-10-CM | POA: Diagnosis not present

## 2022-09-21 DIAGNOSIS — Z853 Personal history of malignant neoplasm of breast: Secondary | ICD-10-CM | POA: Diagnosis not present

## 2022-09-21 DIAGNOSIS — I731 Thromboangiitis obliterans [Buerger's disease]: Secondary | ICD-10-CM | POA: Diagnosis not present

## 2022-09-21 DIAGNOSIS — M9711XD Periprosthetic fracture around internal prosthetic right knee joint, subsequent encounter: Secondary | ICD-10-CM | POA: Diagnosis not present

## 2022-09-21 DIAGNOSIS — Z8701 Personal history of pneumonia (recurrent): Secondary | ICD-10-CM | POA: Diagnosis not present

## 2022-09-21 DIAGNOSIS — E669 Obesity, unspecified: Secondary | ICD-10-CM | POA: Diagnosis not present

## 2022-09-21 DIAGNOSIS — E039 Hypothyroidism, unspecified: Secondary | ICD-10-CM | POA: Diagnosis not present

## 2022-09-21 DIAGNOSIS — I129 Hypertensive chronic kidney disease with stage 1 through stage 4 chronic kidney disease, or unspecified chronic kidney disease: Secondary | ICD-10-CM | POA: Diagnosis not present

## 2022-09-21 DIAGNOSIS — N189 Chronic kidney disease, unspecified: Secondary | ICD-10-CM | POA: Diagnosis not present

## 2022-09-23 DIAGNOSIS — M9711XD Periprosthetic fracture around internal prosthetic right knee joint, subsequent encounter: Secondary | ICD-10-CM | POA: Diagnosis not present

## 2022-09-23 DIAGNOSIS — Z96651 Presence of right artificial knee joint: Secondary | ICD-10-CM | POA: Diagnosis not present

## 2022-09-23 DIAGNOSIS — Z471 Aftercare following joint replacement surgery: Secondary | ICD-10-CM | POA: Diagnosis not present

## 2022-09-24 DIAGNOSIS — N136 Pyonephrosis: Secondary | ICD-10-CM | POA: Diagnosis not present

## 2022-09-24 DIAGNOSIS — N139 Obstructive and reflux uropathy, unspecified: Secondary | ICD-10-CM | POA: Diagnosis not present

## 2022-09-24 DIAGNOSIS — R339 Retention of urine, unspecified: Secondary | ICD-10-CM | POA: Diagnosis not present

## 2022-09-24 DIAGNOSIS — N179 Acute kidney failure, unspecified: Secondary | ICD-10-CM | POA: Diagnosis not present

## 2022-09-24 DIAGNOSIS — Z466 Encounter for fitting and adjustment of urinary device: Secondary | ICD-10-CM | POA: Diagnosis not present

## 2022-09-24 DIAGNOSIS — S72401D Unspecified fracture of lower end of right femur, subsequent encounter for closed fracture with routine healing: Secondary | ICD-10-CM | POA: Diagnosis not present

## 2022-09-25 DIAGNOSIS — N179 Acute kidney failure, unspecified: Secondary | ICD-10-CM | POA: Diagnosis not present

## 2022-09-25 DIAGNOSIS — N136 Pyonephrosis: Secondary | ICD-10-CM | POA: Diagnosis not present

## 2022-09-25 DIAGNOSIS — R339 Retention of urine, unspecified: Secondary | ICD-10-CM | POA: Diagnosis not present

## 2022-09-25 DIAGNOSIS — Z466 Encounter for fitting and adjustment of urinary device: Secondary | ICD-10-CM | POA: Diagnosis not present

## 2022-09-25 DIAGNOSIS — N139 Obstructive and reflux uropathy, unspecified: Secondary | ICD-10-CM | POA: Diagnosis not present

## 2022-09-25 DIAGNOSIS — S72401D Unspecified fracture of lower end of right femur, subsequent encounter for closed fracture with routine healing: Secondary | ICD-10-CM | POA: Diagnosis not present

## 2022-09-30 DIAGNOSIS — Z466 Encounter for fitting and adjustment of urinary device: Secondary | ICD-10-CM | POA: Diagnosis not present

## 2022-09-30 DIAGNOSIS — S72401D Unspecified fracture of lower end of right femur, subsequent encounter for closed fracture with routine healing: Secondary | ICD-10-CM | POA: Diagnosis not present

## 2022-09-30 DIAGNOSIS — N179 Acute kidney failure, unspecified: Secondary | ICD-10-CM | POA: Diagnosis not present

## 2022-09-30 DIAGNOSIS — N139 Obstructive and reflux uropathy, unspecified: Secondary | ICD-10-CM | POA: Diagnosis not present

## 2022-09-30 DIAGNOSIS — R339 Retention of urine, unspecified: Secondary | ICD-10-CM | POA: Diagnosis not present

## 2022-09-30 DIAGNOSIS — N136 Pyonephrosis: Secondary | ICD-10-CM | POA: Diagnosis not present

## 2022-10-06 DIAGNOSIS — R310 Gross hematuria: Secondary | ICD-10-CM | POA: Diagnosis not present

## 2022-10-06 DIAGNOSIS — R339 Retention of urine, unspecified: Secondary | ICD-10-CM | POA: Diagnosis not present

## 2022-10-07 DIAGNOSIS — Z466 Encounter for fitting and adjustment of urinary device: Secondary | ICD-10-CM | POA: Diagnosis not present

## 2022-10-07 DIAGNOSIS — S72401D Unspecified fracture of lower end of right femur, subsequent encounter for closed fracture with routine healing: Secondary | ICD-10-CM | POA: Diagnosis not present

## 2022-10-07 DIAGNOSIS — N179 Acute kidney failure, unspecified: Secondary | ICD-10-CM | POA: Diagnosis not present

## 2022-10-07 DIAGNOSIS — N139 Obstructive and reflux uropathy, unspecified: Secondary | ICD-10-CM | POA: Diagnosis not present

## 2022-10-07 DIAGNOSIS — R339 Retention of urine, unspecified: Secondary | ICD-10-CM | POA: Diagnosis not present

## 2022-10-07 DIAGNOSIS — N136 Pyonephrosis: Secondary | ICD-10-CM | POA: Diagnosis not present

## 2022-10-09 ENCOUNTER — Ambulatory Visit (INDEPENDENT_AMBULATORY_CARE_PROVIDER_SITE_OTHER): Payer: Medicare Other | Admitting: Family Medicine

## 2022-10-09 VITALS — Wt 183.0 lb

## 2022-10-09 DIAGNOSIS — Z0000 Encounter for general adult medical examination without abnormal findings: Secondary | ICD-10-CM

## 2022-10-09 NOTE — Patient Instructions (Addendum)
I really enjoyed getting to talk with you today! I am available on Tuesdays and Thursdays for virtual visits if you have any questions or concerns, or if I can be of any further assistance.   CHECKLIST FROM ANNUAL WELLNESS VISIT:  -Follow up (please call to schedule if not scheduled after visit):   -yearly for annual wellness visit with primary care office  Here is a list of your preventive care/health maintenance measures and the plan for each if any are due:  PLAN For any measures below that may be due:   Health Maintenance  Topic Date Due   COVID-19 Vaccine (8 - 2023-24 season) 10/25/2022 (Originally 05/01/2022)   INFLUENZA VACCINE  11/20/2022   MAMMOGRAM  08/11/2023   Medicare Annual Wellness (AWV)  10/09/2023   DTaP/Tdap/Td (2 - Td or Tdap) 01/05/2032   Pneumonia Vaccine 95+ Years old  Completed   DEXA SCAN  Completed   Hepatitis C Screening  Completed   Zoster Vaccines- Shingrix  Completed   HPV VACCINES  Aged Out   Colonoscopy  Discontinued    -See a dentist at least yearly  -Get your eyes checked and then per your eye specialist's recommendations  -Other issues addressed today:   -I have included below further information regarding a healthy whole foods based diet, physical activity guidelines for adults, stress management and opportunities for social connections. I hope you find this information useful.   -----------------------------------------------------------------------------------------------------------------------------------------------------------------------------------------------------------------------------------------------------------  NUTRITION: -eat real food: lots of colorful vegetables (half the plate) and fruits -5-7 servings of vegetables and fruits per day (fresh or steamed is best), exp. 2 servings of vegetables with lunch and dinner and 2 servings of fruit per day. Berries and greens such as kale and collards are great choices.  -consume on  a regular basis: whole grains (make sure first ingredient on label contains the word "whole"), fresh fruits, fish, nuts, seeds, healthy oils (such as olive oil, avocado oil, grape seed oil) -may eat small amounts of dairy and lean meat on occasion, but avoid processed meats such as ham, bacon, lunch meat, etc. -drink water -try to avoid fast food and pre-packaged foods, processed meat -most experts advise limiting sodium to < 2300mg  per day, should limit further is any chronic conditions such as high blood pressure, heart disease, diabetes, etc. The American Heart Association advised that < 1500mg  is is ideal -try to avoid foods that contain any ingredients with names you do not recognize  -try to avoid sugar/sweets (except for the natural sugar that occurs in fresh fruit) -try to avoid sweet drinks -try to avoid white rice, white bread, pasta (unless whole grain), white or yellow potatoes  EXERCISE GUIDELINES FOR ADULTS: -if you wish to increase your physical activity, do so gradually and with the approval of your doctor -STOP and seek medical care immediately if you have any chest pain, chest discomfort or trouble breathing when starting or increasing exercise  -move and stretch your body, legs, feet and arms when sitting for long periods -Physical activity guidelines for optimal health in adults: -once able advise at least 150 minutes per week of aerobic exercise (can talk, but not sing) once approved by your doctor, 20-30 minutes of sustained activity or two 10 minute episodes of sustained activity every day.  -for now would encourage 10-15 minutes of exercise in the wheelchair twice daily and to move as much as you are able throughout the day -resistance training at least 2 days per week if approved by your doctor -balance  exercises 3+ days per week (ONCE ABLE TO DO WEIGHT BEARING EXERCISE):   Stand somewhere where you have something sturdy to hold onto if you lose balance.    1) lift up on  toes, start with 5x per day and work up to 20x   2) stand and lift on leg straight out to the side so that foot is a few inches of the floor, start with 5x each side and work up to 20x each side   3) stand on one foot, start with 5 seconds each side and work up to 20 seconds on each side   STRESS MANAGEMENT: -can try meditating, or just sitting quietly with deep breathing while intentionally relaxing all parts of your body for 5 minutes daily -if you need further help with stress, anxiety or depression please follow up with your primary doctor or contact the wonderful folks at [facility]: [phone]

## 2022-10-09 NOTE — Progress Notes (Signed)
MEDICARE WELLNESS VISIT PATIENT CHECK-IN and HEALTH RISK ASSESSMENT QUESTIONNAIRE:  -completed by phone/video for upcoming Medicare Preventive Visit  Pre-Visit Check-in: 1)Vitals (height, wt, BP, etc) - record in vitals section for visit on day of visit 2)Review and Update Medications, Allergies PMH, Surgeries, Social history in Epic 3)Hospitalizations in the last year with date/reason? Yes, husband reports pt had a fall in April and shattered her femur on R side. Had surgery on 08/19/2022. At [facility].   4)Review and Update Care Team (patient's specialists) in Epic 5) Complete PHQ9 in Epic  6) Complete Fall Screening in Epic 7)Review all Health Maintenance Due and order under PCP if not done.  8)Medicare Wellness Questionnaire: Answer theses question about your habits: Do you drink alcohol? no How many drinks do you have a day?n/a Have you ever smoked?yes Have you stopped smoking and date if applicable? 1995  How many packs a day do you smoke? 1 pk a day Do you use smokeless tobacco?no Do you use illicit drugs?no Do you exercises? No pt is on non-weight bearing  Are you sexually active? No  What did you eat for breakfast today (or yesterday)? Typical breakfast on soft diet. 30mg  protein drink, cereal  What did you eat for lunch today (or yesterday)?  soup, egg salad, protein. Croissant What did you eat for dinner today (or yesterday)?30 mg protein drink, soft food-soup.   Typical snacks:crackers, pretzel, blue berry, banana What beverages do you drink besides water:water 60-80 oz a day, tea in the morning  Answer theses question about you: Can you perform most household chores?no - because of fracture Do you find it hard to follow a conversation in a noisy room?no Do you find it hard to understand a speaker at church or in a meeting?no Do you often ask people to speak up or repeat themselves?no Do you experience ringing in your ears?no Do you have difficulty understanding a  soft or whispered voice?no Do you feel that you have a problem with memory?no Do you often misplace items?no Do you balance your checkbook and or bank acounts?husband handle it.  Do you feel safe at home?yes Last dentist visit? This year- February or March Do you need assistance with any of the following: Yes due to current situation.   Driving?yes    Feeding yourself?yes  Getting from bed to chair?yes  Getting to the toilet?yes  Bathing or showering?yes  Dressing yourself?yes  Managing money?yes  Climbing a flight of stairs?yes  Preparing meals?yes  Do you have Advanced Directives in place (Living Will, Healthcare Power or Attorney)? Yes   Last eye Exam and location?Dr. Leanord Asal with [place] [facility] last year.    Do you currently use prescribed or non-prescribed narcotic or opioid pain medications?no  Do you have a history or close family history of breast, ovarian, tubal or peritoneal cancer or a family member with BRCA 1/2 (breast cancer susceptibility 1 and 2) gene mutations? no  Nurse/Assistant Credentials/time stamp: Karpuih M./CMA/3:26pm    ----------------------------------------------------------------------------------------------------------------------------------------------------------------------------------------------------------------------   MEDICARE ANNUAL PREVENTIVE VISIT WITH PROVIDER: (Welcome to [employer], initial annual wellness or annual wellness exam)  Virtual Visit via Phone Note  I connected with Kristen Escobar on 10/09/22 by phone  and verified that I am speaking with the correct person using two identifiers.  Location patient: home Location provider:work or home office Persons participating in the virtual visit: patient, provider  Concerns and/or follow up today: she is in wheelchair still, had PT, still non weight bearing due to the femur fracture.  She is doing PT exercises in her chair. She had some hematuria a month ago - was treated for  this - now doing fine. Seeing urologist and they did a scope earlier this week and all was ok.    See HM section in Epic for other details of completed HM.    ROS: negative for report of fevers, unintentional weight loss, vision changes, vision loss, hearing loss or change, chest pain, sob, hemoptysis, melena, hematochezia, falls, bleeding or bruising, thoughts of suicide or self harm, memory loss  Patient-completed extensive health risk assessment - reviewed and discussed with the patient: See Health Risk Assessment completed with patient prior to the visit either above or in recent phone note. This was reviewed in detailed with the patient today and appropriate recommendations, orders and referrals were placed as needed per Summary below and patient instructions.   Review of Medical History: -PMH, PSH, Family History and current specialty and care providers reviewed and updated and listed below   Patient Care Team: Kristian Covey, MD as PCP - General (Family Medicine) Drue Second, MD as Consulting Physician (Internal Medicine) Lurline Hare, MD as Consulting Physician (Radiation Oncology) Serena Croissant, MD as Consulting Physician (Hematology and Oncology) Ollen Gross, MD as Consulting Physician (Orthopedic Surgery) Arminda Resides, MD as Consulting Physician (Dermatology) Manning Charity, OD as Consulting Physician (Optometry) Barnie Alderman, MD as Consulting Physician (Otolaryngology)   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   Diffuse large B cell lymphoma (HCC) dx'd 02/17/17   in remission 2 years now   had chemo    GERD (gastroesophageal reflux disease)    Heart murmur    07/03/21 echo: LVEF 60-65%, grade I DD, normal PASP, trivial MR, mild  AI   History of radiation therapy 07/12/10,completed   right breast 60 Gy x30 fx   Hypertension    no meds currently   Hypothyroidism    Lymphoma (HCC)    Obesity    PE (pulmonary thromboembolism) (HCC) 01/2017   in setting of chemotherapy for lymphoma   Peripheral vascular disease (HCC) 1995   PT DEVELOPED CIRCULATION PROBLEMS IN BOTH HANDS AND GANGRENE OF BOTH INDEX FINGERS--REQUIRING AMPUTATION OF THE INDEX FINGERS AND VASCULAR SURGERY.  PT TOLD HER PROBLEMS RELATED TO SMOKING.   NO OTHER PROBLEMS SINCE.   Personal history of radiation therapy 2012   Right Breast Cancer   Pneumonia    Thyroid mass    Vocal cord paralysis    left    Past Surgical History:  Procedure Laterality Date   AMPUTATION     partial amputation of both index fingers   BREAST LUMPECTOMY Right 04/01/2010   BREAST SURGERY  2011   lumpectomy with node sampling- RIGHT   CATARACT EXTRACTION, BILATERAL  approx 2017   COLONOSCOPY  06/17/2022   per Dr. Adela Lank, one adenomatous polyp, no repeats needed   ESOPHAGOGASTRODUODENOSCOPY     EXCISION MASS NECK Left 12/26/2016   Procedure: EXCISION MASS NECK;  Surgeon: Serena Colonel, MD;  [facility];  Service: ENT;  Laterality: Left;  open excision of thyroid mass left side with frozen section   HAND SURGERY Bilateral 1995   Amputaed pointer fingers bilaterally   IR FLUORO GUIDE PORT INSERTION RIGHT  01/14/2017  IR GASTROSTOMY TUBE MOD SED  02/19/2017   IR GASTROSTOMY TUBE REMOVAL  12/07/2018   IR PATIENT EVAL TECH 0-60 MINS  01/14/2019   IR REMOVAL TUN ACCESS W/ PORT W/O FL MOD SED  11/30/2017   IR REPLACE G-TUBE SIMPLE WO FLUORO  08/03/2017   IR REPLACE G-TUBE SIMPLE WO FLUORO  06/10/2018   IR REPLC GASTRO/COLONIC TUBE PERCUT W/FLUORO  02/25/2017   IR REPLC GASTRO/COLONIC TUBE PERCUT W/FLUORO  03/09/2017   IR US GUIDE BX ASP/DRAIN  12/07/2018   IR US GUIDE VASC ACCESS RIGHT  01/14/2017   JOINT REPLACEMENT     LAPAROSCOPIC GASTRIC RESECTION N/A 09/09/2019    Procedure: LAPAROSCOPIC CLOSURE OF GASTRO-CUTANEOUS FISTULA, EXCISION OF FISTUAL TRACK;  Surgeon: Ovidio Kin, MD;  Location: [facility] ORS;  Service: General;  Laterality: N/A;   LUMBAR LAMINECTOMY/DECOMPRESSION MICRODISCECTOMY N/A 01/30/2022   Procedure: Microdiscetomy hemilaminectomy decompression Lumbar Five-Sacral One;  Surgeon: Jene Every, MD;  Location: [facility] OR;  Service: Orthopedics;  Laterality: N/A;  90 mins 3 C-Bed   TONSILLECTOMY     TOTAL HIP ARTHROPLASTY  12/16/2011   right hip   TOTAL HIP ARTHROPLASTY  01/20/2012   Procedure: TOTAL HIP ARTHROPLASTY ANTERIOR APPROACH;  Surgeon: Shelda Pal, MD;  Location: [facility] ORS;  Service: Orthopedics;  Laterality: Left;   TOTAL KNEE ARTHROPLASTY Right 05/31/2018   Procedure: TOTAL KNEE ARTHROPLASTY;  Surgeon: Ollen Gross, MD;  Location: [facility] ORS;  Service: Orthopedics;  Laterality: Right;  Adductor Block   TUBAL LIGATION     VASCULAR SURGERY     both hands    Social History   Socioeconomic History   Marital status: Married    Spouse name: Not on file   Number of children: 3   Years of education: Not on file   Highest education level: Not on file  Occupational History   Occupation: retired  Tobacco Use   Smoking status: Former    Packs/day: 1.00    Years: 20.00    Additional pack years: 0.00    Total pack years: 20.00    Types: Cigarettes    Quit date: 04/21/1993    Years since quitting: 29.4   Smokeless tobacco: Never  Vaping Use   Vaping Use: Never used  Substance and Sexual Activity   Alcohol use: Not Currently    Comment: OCCAS - MAYBE ONCE A MONTH   Drug use: No   Sexual activity: Not Currently  Other Topics Concern   Not on file  Social History Narrative   Not on file   Social Determinants of Health   Financial Resource Strain: Low Risk  (10/10/2021)   Overall Financial Resource Strain (CARDIA)    Difficulty of Paying Living Expenses: Not hard at all  Food Insecurity: No Food Insecurity (09/04/2022)   Hunger  Vital Sign    Worried About Running Out of Food in the Last Year: Never true    Ran Out of Food in the Last Year: Never true  Transportation Needs: No Transportation Needs (09/04/2022)   PRAPARE - Administrator, Civil Service (Medical): No    Lack of Transportation (Non-Medical): No  Physical Activity: Inactive (10/09/2022)   Exercise Vital Sign    Days of Exercise per Week: 0 days    Minutes of Exercise per Session: 0 min  Stress: No Stress Concern Present (10/10/2021)   [employer] of Occupational Health - Occupational Stress Questionnaire    Feeling of Stress : Not at all  Social Connections: Unknown (10/10/2021)  Social Advertising account executive [NHANES]    Frequency of Communication with Friends and Family: More than three times a week    Frequency of Social Gatherings with Friends and Family: More than three times a week    Attends Religious Services: Never    Database administrator or Organizations: Yes    Attends Engineer, structural: More than 4 times per year    Marital Status: Not on file  Intimate Partner Violence: Not At Risk (09/01/2022)   Humiliation, Afraid, Rape, and Kick questionnaire    Fear of Current or Ex-Partner: No    Emotionally Abused: No    Physically Abused: No    Sexually Abused: No    Family History  Problem Relation Age of Onset   Cancer Mother        pt unaware of what kind   Hearing loss Mother    Coronary artery disease Father    Diabetes Father    Diabetes Sister    Breast cancer Sister    Coronary artery disease Brother    Coronary artery disease Brother    Colon cancer Neg Hx    Stomach cancer Neg Hx    Rectal cancer Neg Hx    Esophageal cancer Neg Hx     Current Outpatient Medications on File Prior to Visit  Medication Sig Dispense Refill   amLODipine (NORVASC) 10 MG tablet Take 1 tablet (10 mg total) by mouth daily. 90 tablet 3   levothyroxine (SYNTHROID) 75 MCG tablet Take 1 tablet (75 mcg total) by  mouth daily. 90 tablet 3   omeprazole (PRILOSEC) 20 MG capsule Take 1 capsule (20 mg total) by mouth daily. 90 capsule 3   polyethylene glycol (MIRALAX / GLYCOLAX) 17 g packet Take 17 g by mouth daily as needed for moderate constipation. 14 each 0   aspirin EC 81 MG tablet Take 1 tablet (81 mg total) by mouth 2 (two) times daily. Swallow whole. (Patient not taking: Reported on 10/09/2022) 30 tablet 12   docusate sodium (COLACE) 100 MG capsule Take 1 capsule (100 mg total) by mouth 2 (two) times daily as needed for mild constipation. (Patient not taking: Reported on 10/09/2022) 10 capsule 0   ferrous sulfate 325 (65 FE) MG tablet Take 1 tablet (325 mg total) by mouth daily with breakfast. (Patient not taking: Reported on 10/09/2022) 30 tablet 1   folic acid (FOLVITE) 1 MG tablet Take 1 tablet (1 mg total) by mouth daily. (Patient not taking: Reported on 10/09/2022) 30 tablet 0   magnesium oxide (MAG-OX) 400 (240 Mg) MG tablet Take 0.5 tablets (200 mg total) by mouth 2 (two) times daily. (Patient not taking: Reported on 10/09/2022) 10 tablet 0   Multiple Vitamins-Minerals (ADULT GUMMY PO) Take 2 tablets by mouth daily. Woman's Gummy (Patient not taking: Reported on 10/09/2022)     No current facility-administered medications on file prior to visit.    No Known Allergies     Physical Exam Vitals:   Estimated body mass index is 33.47 kg/m as calculated from the following:   Height as of 08/31/22: 5\' 2"  (1.575 m).   Weight as of this encounter: 183 lb (83 kg).  EKG (optional): deferred due to virtual visit  GENERAL: alert, oriented, no acute distress detected, full vision exam deferred due to pandemic and/or virtual encounter  PSYCH/NEURO: pleasant and cooperative, no obvious depression or anxiety, speech and thought processing grossly intact, Cognitive function grossly intact  [employer] Visit  from 10/09/2022 in [facility] [place]  PHQ-9 Total Score 0            10/09/2022    3:49 PM 10/09/2022    3:26 PM 07/22/2022    9:00 AM 04/22/2022   10:12 AM 10/10/2021   10:41 AM  Depression screen PHQ 2/9  Decreased Interest 0 0 0 0 0  Down, Depressed, Hopeless 0 0 0 0 0  PHQ - 2 Score 0 0 0 0 0  Altered sleeping  0 0    Tired, decreased energy  0 0    Change in appetite  0 0    Feeling bad or failure about yourself   0 0    Trouble concentrating  0 0    Moving slowly or fidgety/restless  0 0    Suicidal thoughts  0 0    PHQ-9 Score  0 0    Difficult doing work/chores  Extremely dIfficult Not difficult at all         06/18/2021    8:30 AM 10/10/2021   10:43 AM 01/30/2022    9:51 AM 04/22/2022   10:12 AM 07/22/2022    9:00 AM  Fall Risk  Falls in the past year? 0 0  0 0  Was there an injury with Fall? 0 0  0 0  Fall Risk Category Calculator 0 0  0 0  Fall Risk Category (Retired) Low Low  Low   (RETIRED) Patient Fall Risk Level Low fall risk Low fall risk Moderate fall risk Low fall risk   Patient at Risk for Falls Due to No Fall Risks No Fall Risks  No Fall Risks No Fall Risks  Fall risk Follow up Falls evaluation completed   Falls evaluation completed Falls evaluation completed     SUMMARY AND PLAN:  Encounter for Medicare annual wellness exam   Discussed applicable health maintenance/preventive health measures and advised and referred or ordered per patient preferences:  Health Maintenance  Topic Date Due   COVID-19 Vaccine (8 - 2023-24 season) 10/25/2022 (Originally 05/01/2022)   INFLUENZA VACCINE  11/20/2022   MAMMOGRAM  08/11/2023   Medicare Annual Wellness (AWV)  10/09/2023   DTaP/Tdap/Td (2 - Td or Tdap) 01/05/2032   Pneumonia Vaccine 46+ Years old  Completed   DEXA SCAN  Completed   Hepatitis C Screening  Completed   Zoster Vaccines- Shingrix  Completed   HPV VACCINES  Aged Out   Colonoscopy  Discontinued    Education and counseling on the following was provided based on the above review of health and a plan/checklist for  the patient, along with additional information discussed, was provided for the patient in the patient instructions :  -Provided counseling and plan for increased risk of falling if applicable per above screening. Currently non weight bearing and reports per ortho they will do more physical therapy once out of wheelchair. Encouraged her to do exercise from PT every day and to move upper body as much as possible, do leg lifts, shift position often, while in the wheelchair. -Advised and counseled on a healthy lifestyle - including the importance of a healthy diet, regular physical activity, social connections and stress management. -Reviewed patient's current diet. Advised and counseled on a whole foods based healthy diet. A summary of a healthy diet was provided in the Patient Instructions.  -reviewed patient's current physical activity level and discussed exercise she could do in the wheelchair. -Advise yearly dental visits at minimum and regular eye  exams  Follow up: see patient instructions     Patient Instructions  I really enjoyed getting to talk with you today! I am available on Tuesdays and Thursdays for virtual visits if you have any questions or concerns, or if I can be of any further assistance.   CHECKLIST FROM ANNUAL WELLNESS VISIT:  -Follow up (please call to schedule if not scheduled after visit):   -yearly for annual wellness visit with primary care office  Here is a list of your preventive care/health maintenance measures and the plan for each if any are due:  PLAN For any measures below that may be due:   Health Maintenance  Topic Date Due   COVID-19 Vaccine (8 - 2023-24 season) 10/25/2022 (Originally 05/01/2022)   INFLUENZA VACCINE  11/20/2022   MAMMOGRAM  08/11/2023   Medicare Annual Wellness (AWV)  10/09/2023   DTaP/Tdap/Td (2 - Td or Tdap) 01/05/2032   Pneumonia Vaccine 8+ Years old  Completed   DEXA SCAN  Completed   Hepatitis C Screening  Completed   Zoster  Vaccines- Shingrix  Completed   HPV VACCINES  Aged Out   Colonoscopy  Discontinued    -See a dentist at least yearly  -Get your eyes checked and then per your eye specialist's recommendations  -Other issues addressed today:   -I have included below further information regarding a healthy whole foods based diet, physical activity guidelines for adults, stress management and opportunities for social connections. I hope you find this information useful.   -----------------------------------------------------------------------------------------------------------------------------------------------------------------------------------------------------------------------------------------------------------  NUTRITION: -eat real food: lots of colorful vegetables (half the plate) and fruits -5-7 servings of vegetables and fruits per day (fresh or steamed is best), exp. 2 servings of vegetables with lunch and dinner and 2 servings of fruit per day. Berries and greens such as kale and collards are great choices.  -consume on a regular basis: whole grains (make sure first ingredient on label contains the word "whole"), fresh fruits, fish, nuts, seeds, healthy oils (such as olive oil, avocado oil, grape seed oil) -may eat small amounts of dairy and lean meat on occasion, but avoid processed meats such as ham, bacon, lunch meat, etc. -drink water -try to avoid fast food and pre-packaged foods, processed meat -most experts advise limiting sodium to < 2300mg  per day, should limit further is any chronic conditions such as high blood pressure, heart disease, diabetes, etc. The American Heart Association advised that < 1500mg  is is ideal -try to avoid foods that contain any ingredients with names you do not recognize  -try to avoid sugar/sweets (except for the natural sugar that occurs in fresh fruit) -try to avoid sweet drinks -try to avoid white rice, white bread, pasta (unless whole grain), white or  yellow potatoes  EXERCISE GUIDELINES FOR ADULTS: -if you wish to increase your physical activity, do so gradually and with the approval of your doctor -STOP and seek medical care immediately if you have any chest pain, chest discomfort or trouble breathing when starting or increasing exercise  -move and stretch your body, legs, feet and arms when sitting for long periods -Physical activity guidelines for optimal health in adults: -once able advise at least 150 minutes per week of aerobic exercise (can talk, but not sing) once approved by your doctor, 20-30 minutes of sustained activity or two 10 minute episodes of sustained activity every day.  -for now would encourage 10-15 minutes of exercise in the wheelchair twice daily and to move as much as you are able throughout the  day -resistance training at least 2 days per week if approved by your doctor -balance exercises 3+ days per week (ONCE ABLE TO DO WEIGHT BEARING EXERCISE):   Stand somewhere where you have something sturdy to hold onto if you lose balance.    1) lift up on toes, start with 5x per day and work up to 20x   2) stand and lift on leg straight out to the side so that foot is a few inches of the floor, start with 5x each side and work up to 20x each side   3) stand on one foot, start with 5 seconds each side and work up to 20 seconds on each side   STRESS MANAGEMENT: -can try meditating, or just sitting quietly with deep breathing while intentionally relaxing all parts of your body for 5 minutes daily -if you need further help with stress, anxiety or depression please follow up with your primary doctor or contact the wonderful folks at [facility]: [phone]          Terressa Koyanagi, DO

## 2022-10-15 DIAGNOSIS — R339 Retention of urine, unspecified: Secondary | ICD-10-CM | POA: Diagnosis not present

## 2022-10-15 DIAGNOSIS — N136 Pyonephrosis: Secondary | ICD-10-CM | POA: Diagnosis not present

## 2022-10-15 DIAGNOSIS — S72401D Unspecified fracture of lower end of right femur, subsequent encounter for closed fracture with routine healing: Secondary | ICD-10-CM | POA: Diagnosis not present

## 2022-10-15 DIAGNOSIS — N139 Obstructive and reflux uropathy, unspecified: Secondary | ICD-10-CM | POA: Diagnosis not present

## 2022-10-15 DIAGNOSIS — N179 Acute kidney failure, unspecified: Secondary | ICD-10-CM | POA: Diagnosis not present

## 2022-10-15 DIAGNOSIS — Z466 Encounter for fitting and adjustment of urinary device: Secondary | ICD-10-CM | POA: Diagnosis not present

## 2022-10-31 DIAGNOSIS — Z471 Aftercare following joint replacement surgery: Secondary | ICD-10-CM | POA: Diagnosis not present

## 2022-11-04 DIAGNOSIS — R339 Retention of urine, unspecified: Secondary | ICD-10-CM | POA: Diagnosis not present

## 2022-11-04 DIAGNOSIS — N1330 Unspecified hydronephrosis: Secondary | ICD-10-CM | POA: Diagnosis not present

## 2022-11-04 DIAGNOSIS — I739 Peripheral vascular disease, unspecified: Secondary | ICD-10-CM | POA: Diagnosis not present

## 2022-11-04 DIAGNOSIS — Z96642 Presence of left artificial hip joint: Secondary | ICD-10-CM | POA: Diagnosis not present

## 2022-11-04 DIAGNOSIS — Z853 Personal history of malignant neoplasm of breast: Secondary | ICD-10-CM | POA: Diagnosis not present

## 2022-11-04 DIAGNOSIS — S7291XA Unspecified fracture of right femur, initial encounter for closed fracture: Secondary | ICD-10-CM | POA: Diagnosis not present

## 2022-11-04 DIAGNOSIS — Z8701 Personal history of pneumonia (recurrent): Secondary | ICD-10-CM | POA: Diagnosis not present

## 2022-11-04 DIAGNOSIS — Z87440 Personal history of urinary (tract) infections: Secondary | ICD-10-CM | POA: Diagnosis not present

## 2022-11-04 DIAGNOSIS — N138 Other obstructive and reflux uropathy: Secondary | ICD-10-CM | POA: Diagnosis not present

## 2022-11-04 DIAGNOSIS — M5416 Radiculopathy, lumbar region: Secondary | ICD-10-CM | POA: Diagnosis not present

## 2022-11-04 DIAGNOSIS — N189 Chronic kidney disease, unspecified: Secondary | ICD-10-CM | POA: Diagnosis not present

## 2022-11-04 DIAGNOSIS — Z9181 History of falling: Secondary | ICD-10-CM | POA: Diagnosis not present

## 2022-11-04 DIAGNOSIS — M9701XD Periprosthetic fracture around internal prosthetic right hip joint, subsequent encounter: Secondary | ICD-10-CM | POA: Diagnosis not present

## 2022-11-04 DIAGNOSIS — M1990 Unspecified osteoarthritis, unspecified site: Secondary | ICD-10-CM | POA: Diagnosis not present

## 2022-11-04 DIAGNOSIS — Z96651 Presence of right artificial knee joint: Secondary | ICD-10-CM | POA: Diagnosis not present

## 2022-11-04 DIAGNOSIS — Z86711 Personal history of pulmonary embolism: Secondary | ICD-10-CM | POA: Diagnosis not present

## 2022-11-04 DIAGNOSIS — K219 Gastro-esophageal reflux disease without esophagitis: Secondary | ICD-10-CM | POA: Diagnosis not present

## 2022-11-04 DIAGNOSIS — E039 Hypothyroidism, unspecified: Secondary | ICD-10-CM | POA: Diagnosis not present

## 2022-11-04 DIAGNOSIS — I129 Hypertensive chronic kidney disease with stage 1 through stage 4 chronic kidney disease, or unspecified chronic kidney disease: Secondary | ICD-10-CM | POA: Diagnosis not present

## 2022-11-05 DIAGNOSIS — M9701XD Periprosthetic fracture around internal prosthetic right hip joint, subsequent encounter: Secondary | ICD-10-CM | POA: Diagnosis not present

## 2022-11-05 DIAGNOSIS — I129 Hypertensive chronic kidney disease with stage 1 through stage 4 chronic kidney disease, or unspecified chronic kidney disease: Secondary | ICD-10-CM | POA: Diagnosis not present

## 2022-11-05 DIAGNOSIS — E039 Hypothyroidism, unspecified: Secondary | ICD-10-CM | POA: Diagnosis not present

## 2022-11-05 DIAGNOSIS — R339 Retention of urine, unspecified: Secondary | ICD-10-CM | POA: Diagnosis not present

## 2022-11-05 DIAGNOSIS — N189 Chronic kidney disease, unspecified: Secondary | ICD-10-CM | POA: Diagnosis not present

## 2022-11-05 DIAGNOSIS — N1330 Unspecified hydronephrosis: Secondary | ICD-10-CM | POA: Diagnosis not present

## 2022-11-06 DIAGNOSIS — I129 Hypertensive chronic kidney disease with stage 1 through stage 4 chronic kidney disease, or unspecified chronic kidney disease: Secondary | ICD-10-CM | POA: Diagnosis not present

## 2022-11-06 DIAGNOSIS — R339 Retention of urine, unspecified: Secondary | ICD-10-CM | POA: Diagnosis not present

## 2022-11-06 DIAGNOSIS — M9701XD Periprosthetic fracture around internal prosthetic right hip joint, subsequent encounter: Secondary | ICD-10-CM | POA: Diagnosis not present

## 2022-11-06 DIAGNOSIS — N1330 Unspecified hydronephrosis: Secondary | ICD-10-CM | POA: Diagnosis not present

## 2022-11-06 DIAGNOSIS — N189 Chronic kidney disease, unspecified: Secondary | ICD-10-CM | POA: Diagnosis not present

## 2022-11-06 DIAGNOSIS — E039 Hypothyroidism, unspecified: Secondary | ICD-10-CM | POA: Diagnosis not present

## 2022-11-10 DIAGNOSIS — R339 Retention of urine, unspecified: Secondary | ICD-10-CM | POA: Diagnosis not present

## 2022-11-10 DIAGNOSIS — M9701XD Periprosthetic fracture around internal prosthetic right hip joint, subsequent encounter: Secondary | ICD-10-CM | POA: Diagnosis not present

## 2022-11-10 DIAGNOSIS — E039 Hypothyroidism, unspecified: Secondary | ICD-10-CM | POA: Diagnosis not present

## 2022-11-10 DIAGNOSIS — N1330 Unspecified hydronephrosis: Secondary | ICD-10-CM | POA: Diagnosis not present

## 2022-11-10 DIAGNOSIS — N189 Chronic kidney disease, unspecified: Secondary | ICD-10-CM | POA: Diagnosis not present

## 2022-11-10 DIAGNOSIS — I129 Hypertensive chronic kidney disease with stage 1 through stage 4 chronic kidney disease, or unspecified chronic kidney disease: Secondary | ICD-10-CM | POA: Diagnosis not present

## 2022-11-12 DIAGNOSIS — N1330 Unspecified hydronephrosis: Secondary | ICD-10-CM | POA: Diagnosis not present

## 2022-11-12 DIAGNOSIS — R339 Retention of urine, unspecified: Secondary | ICD-10-CM | POA: Diagnosis not present

## 2022-11-12 DIAGNOSIS — M9701XD Periprosthetic fracture around internal prosthetic right hip joint, subsequent encounter: Secondary | ICD-10-CM | POA: Diagnosis not present

## 2022-11-12 DIAGNOSIS — N189 Chronic kidney disease, unspecified: Secondary | ICD-10-CM | POA: Diagnosis not present

## 2022-11-12 DIAGNOSIS — I129 Hypertensive chronic kidney disease with stage 1 through stage 4 chronic kidney disease, or unspecified chronic kidney disease: Secondary | ICD-10-CM | POA: Diagnosis not present

## 2022-11-12 DIAGNOSIS — E039 Hypothyroidism, unspecified: Secondary | ICD-10-CM | POA: Diagnosis not present

## 2022-11-14 DIAGNOSIS — R339 Retention of urine, unspecified: Secondary | ICD-10-CM | POA: Diagnosis not present

## 2022-11-14 DIAGNOSIS — N3000 Acute cystitis without hematuria: Secondary | ICD-10-CM | POA: Diagnosis not present

## 2022-11-17 DIAGNOSIS — I129 Hypertensive chronic kidney disease with stage 1 through stage 4 chronic kidney disease, or unspecified chronic kidney disease: Secondary | ICD-10-CM | POA: Diagnosis not present

## 2022-11-17 DIAGNOSIS — N1330 Unspecified hydronephrosis: Secondary | ICD-10-CM | POA: Diagnosis not present

## 2022-11-17 DIAGNOSIS — E039 Hypothyroidism, unspecified: Secondary | ICD-10-CM | POA: Diagnosis not present

## 2022-11-17 DIAGNOSIS — N189 Chronic kidney disease, unspecified: Secondary | ICD-10-CM | POA: Diagnosis not present

## 2022-11-17 DIAGNOSIS — M9701XD Periprosthetic fracture around internal prosthetic right hip joint, subsequent encounter: Secondary | ICD-10-CM | POA: Diagnosis not present

## 2022-11-17 DIAGNOSIS — R339 Retention of urine, unspecified: Secondary | ICD-10-CM | POA: Diagnosis not present

## 2022-11-18 ENCOUNTER — Encounter: Payer: Self-pay | Admitting: Family Medicine

## 2022-11-18 ENCOUNTER — Ambulatory Visit (INDEPENDENT_AMBULATORY_CARE_PROVIDER_SITE_OTHER): Payer: Medicare Other | Admitting: Family Medicine

## 2022-11-18 VITALS — BP 112/60 | HR 92 | Temp 98.3°F | Ht 62.0 in

## 2022-11-18 DIAGNOSIS — M25472 Effusion, left ankle: Secondary | ICD-10-CM

## 2022-11-18 DIAGNOSIS — R233 Spontaneous ecchymoses: Secondary | ICD-10-CM

## 2022-11-18 DIAGNOSIS — M25471 Effusion, right ankle: Secondary | ICD-10-CM | POA: Diagnosis not present

## 2022-11-18 NOTE — Progress Notes (Signed)
Established Patient Office Visit  Subjective   Patient ID: Kristen Escobar, female    DOB: 09-22-1945  Age: 77 y.o. MRN: [id number]  Chief Complaint  Patient presents with   Foot Swelling    Patient complains of foot swelling, x1 day    wound    Patient complains of wound on right leg, x3 days , Tried Tylenol   Rash    HPI   Kristen Escobar is seen today with bilateral foot and lower leg rash and some swelling involving both feet and legs.  She also had recent pustular lesion right calf which apparently is resolving.  Denies any recent tick bite.  She does not go outside any.  Did have some recent mild sore throat but no other respiratory symptoms.  She is recovering from complex pelvic fracture which occurred recent beach trip this past spring.  She is ambulating with a walker.    Her recent history is that she developed some dysuria and went to urology last week and diagnosed with UTI.  She was placed on Macrobid and is currently on day 5.  She thinks she may have had some low-grade fever.  No headaches.  No body aches.  No cough.  Not taking any other new medications other than the Macrobid.  Regular medications include amlodipine, levothyroxine, omeprazole  No dyspnea.  No orthopnea.  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   Diffuse large B cell lymphoma (HCC) dx'd 02/17/17   in remission 2 years now   had chemo    GERD (gastroesophageal reflux disease)    Heart murmur    07/03/21 echo: LVEF 60-65%, grade I DD, normal PASP, trivial MR, mild AI   History of radiation therapy 07/12/10,completed   right breast 60 Gy x30 fx   Hypertension    no meds currently   Hypothyroidism    Lymphoma (HCC)    Obesity    PE (pulmonary thromboembolism) (HCC) 01/2017   in  setting of chemotherapy for lymphoma   Peripheral vascular disease (HCC) 1995   PT DEVELOPED CIRCULATION PROBLEMS IN BOTH HANDS AND GANGRENE OF BOTH INDEX FINGERS--REQUIRING AMPUTATION OF THE INDEX FINGERS AND VASCULAR SURGERY.  PT TOLD HER PROBLEMS RELATED TO SMOKING.   NO OTHER PROBLEMS SINCE.   Personal history of radiation therapy 2012   Right Breast Cancer   Pneumonia    Thyroid mass    Vocal cord paralysis    left   Past Surgical History:  Procedure Laterality Date   AMPUTATION     partial amputation of both index fingers   BREAST LUMPECTOMY Right 04/01/2010   BREAST SURGERY  2011   lumpectomy with node sampling- RIGHT   CATARACT EXTRACTION, BILATERAL  approx 2017   COLONOSCOPY  06/17/2022   per Dr. Adela Lank, one adenomatous polyp, no repeats needed   ESOPHAGOGASTRODUODENOSCOPY     EXCISION MASS NECK Left 12/26/2016   Procedure: EXCISION MASS NECK;  Surgeon: Serena Colonel, MD;  Location: [facility] OR;  Service: ENT;  Laterality: Left;  open excision of thyroid mass left side with frozen section   HAND SURGERY Bilateral 1995   Amputaed pointer fingers bilaterally   IR FLUORO GUIDE PORT INSERTION RIGHT  01/14/2017   IR GASTROSTOMY TUBE MOD SED  02/19/2017   IR GASTROSTOMY TUBE REMOVAL  12/07/2018   IR PATIENT EVAL TECH 0-60 MINS  01/14/2019   IR REMOVAL TUN ACCESS W/ PORT W/O FL MOD SED  11/30/2017   IR REPLACE G-TUBE SIMPLE WO FLUORO  08/03/2017   IR REPLACE G-TUBE SIMPLE WO FLUORO  06/10/2018   IR REPLC GASTRO/COLONIC TUBE PERCUT W/FLUORO  02/25/2017   IR REPLC GASTRO/COLONIC TUBE PERCUT W/FLUORO  03/09/2017   IR US GUIDE BX ASP/DRAIN  12/07/2018   IR US GUIDE VASC ACCESS RIGHT  01/14/2017   JOINT REPLACEMENT     LAPAROSCOPIC GASTRIC RESECTION N/A 09/09/2019   Procedure: LAPAROSCOPIC CLOSURE OF GASTRO-CUTANEOUS FISTULA, EXCISION OF FISTUAL TRACK;  Surgeon: Ovidio Kin, MD;  Location: [facility]S;  Service: General;  Laterality: N/A;   LUMBAR LAMINECTOMY/DECOMPRESSION  MICRODISCECTOMY N/A 01/30/2022   Procedure: Microdiscetomy hemilaminectomy decompression Lumbar Five-Sacral One;  Surgeon: Jene Every, MD;  Location: [facility] OR;  Service: Orthopedics;  Laterality: N/A;  90 mins 3 C-Bed   TONSILLECTOMY     TOTAL HIP ARTHROPLASTY  12/16/2011   right hip   TOTAL HIP ARTHROPLASTY  01/20/2012   Procedure: TOTAL HIP ARTHROPLASTY ANTERIOR APPROACH;  Surgeon: Shelda Pal, MD;  Location: [facility];  Service: Orthopedics;  Laterality: Left;   TOTAL KNEE ARTHROPLASTY Right 05/31/2018   Procedure: TOTAL KNEE ARTHROPLASTY;  Surgeon: Ollen Gross, MD;  Location: [facility];  Service: Orthopedics;  Laterality: Right;  Adductor Block   TUBAL LIGATION     VASCULAR SURGERY     both hands    reports that she quit smoking about 29 years ago. Her smoking use included cigarettes. She started smoking about 49 years ago. She has a 20 pack-year smoking history. She has never used smokeless tobacco. She reports that she does not currently use alcohol. She reports that she does not use drugs. family history includes Breast cancer in her sister; Cancer in her mother; Coronary artery disease in her brother, brother, and father; Diabetes in her father and sister; Hearing loss in her mother. No Known Allergies  Review of Systems  Constitutional:  Negative for chills and fever.  Respiratory:  Negative for shortness of breath.   Cardiovascular:  Positive for leg swelling. Negative for chest pain.  Gastrointestinal:  Negative for abdominal pain.  Genitourinary:  Negative for dysuria.  Skin:  Positive for rash.      Objective:     BP 112/60 (BP Location: Right Arm, Patient Position: Sitting, Cuff Size: Large)   Pulse 92   Temp 98.3 F (36.8 C) (Oral)   Ht 5\' 2"  (1.575 m)   SpO2 96%   BMI 33.47 kg/m  BP Readings from Last 3 Encounters:  11/18/22 112/60  09/08/22 (!) 113/55  09/03/22 (!) 129/54   Wt Readings from Last 3 Encounters:  10/09/22 183 lb (83 kg)  09/03/22 183 lb  3.2 oz (83.1 kg)  07/25/22 181 lb 1.6 oz (82.1 kg)      Physical Exam Vitals reviewed.  Constitutional:      Appearance: Normal appearance.  Cardiovascular:     Rate and Rhythm: Normal rate and regular rhythm.  Pulmonary:     Effort: Pulmonary effort is normal.     Breath sounds: Normal breath sounds.  Musculoskeletal:     Comments: He has some edema involving both feet and ankles and lower legs bilaterally.  Skin:    Comments: Several small erythematous nonblanching petechial type lesions lower legs and ankles and feet bilaterally.  No open ulcers.  No pustules noted at this time.  No vesicles.  Neurological:     Mental Status: She is alert.      No results found for any visits on 11/18/22.    The 10-year ASCVD risk score (Arnett DK, et al., 2019) is: 18.2%    Assessment & Plan:   Problem List Items Addressed This Visit   None Visit Diagnoses     Petechiae    -  Primary   Relevant Orders   CBC with Differential/Platelet   Basic metabolic panel   Protime-INR   APTT     Patient presents with some new mild edema feet and ankles and lower legs bilaterally along with petechial type rash bilaterally.  Etiology unclear.  Only new medication is Macrobid.  Has not been outside any and no recent tick bites.  No documented fever.  -Check labs as above with a CBC, basic metabolic panel, INR, PTT -Follow-up promptly for any fever, progressive rash, or other new symptoms -If rash progressing consider discontinuation of Macrobid -Elevate legs frequently.  No follow-ups on file.    Evelena Peat, MD

## 2022-11-18 NOTE — Patient Instructions (Signed)
Follow up for any fever, progressive rash, or increased leg edema

## 2022-11-19 MED ORDER — FUROSEMIDE 20 MG PO TABS: ORAL_TABLET | ORAL | 0 refills | Status: AC

## 2022-11-19 NOTE — Addendum Note (Signed)
Addended by: Christy Sartorius on: 11/19/2022 03:10 PM   Modules accepted: Orders

## 2022-11-20 DIAGNOSIS — N189 Chronic kidney disease, unspecified: Secondary | ICD-10-CM | POA: Diagnosis not present

## 2022-11-20 DIAGNOSIS — I129 Hypertensive chronic kidney disease with stage 1 through stage 4 chronic kidney disease, or unspecified chronic kidney disease: Secondary | ICD-10-CM | POA: Diagnosis not present

## 2022-11-20 DIAGNOSIS — E039 Hypothyroidism, unspecified: Secondary | ICD-10-CM | POA: Diagnosis not present

## 2022-11-20 DIAGNOSIS — R339 Retention of urine, unspecified: Secondary | ICD-10-CM | POA: Diagnosis not present

## 2022-11-20 DIAGNOSIS — N1330 Unspecified hydronephrosis: Secondary | ICD-10-CM | POA: Diagnosis not present

## 2022-11-20 DIAGNOSIS — M9701XD Periprosthetic fracture around internal prosthetic right hip joint, subsequent encounter: Secondary | ICD-10-CM | POA: Diagnosis not present

## 2022-11-24 DIAGNOSIS — N189 Chronic kidney disease, unspecified: Secondary | ICD-10-CM | POA: Diagnosis not present

## 2022-11-24 DIAGNOSIS — E039 Hypothyroidism, unspecified: Secondary | ICD-10-CM | POA: Diagnosis not present

## 2022-11-24 DIAGNOSIS — N1330 Unspecified hydronephrosis: Secondary | ICD-10-CM | POA: Diagnosis not present

## 2022-11-24 DIAGNOSIS — M9701XD Periprosthetic fracture around internal prosthetic right hip joint, subsequent encounter: Secondary | ICD-10-CM | POA: Diagnosis not present

## 2022-11-24 DIAGNOSIS — R339 Retention of urine, unspecified: Secondary | ICD-10-CM | POA: Diagnosis not present

## 2022-11-24 DIAGNOSIS — I129 Hypertensive chronic kidney disease with stage 1 through stage 4 chronic kidney disease, or unspecified chronic kidney disease: Secondary | ICD-10-CM | POA: Diagnosis not present

## 2022-11-25 ENCOUNTER — Encounter: Payer: Self-pay | Admitting: Family Medicine

## 2022-11-25 ENCOUNTER — Ambulatory Visit (INDEPENDENT_AMBULATORY_CARE_PROVIDER_SITE_OTHER): Payer: Medicare Other | Admitting: Family Medicine

## 2022-11-25 ENCOUNTER — Other Ambulatory Visit: Payer: Self-pay | Admitting: Family Medicine

## 2022-11-25 VITALS — BP 142/60 | HR 72 | Temp 97.5°F | Ht 62.0 in

## 2022-11-25 DIAGNOSIS — M310 Hypersensitivity angiitis: Secondary | ICD-10-CM | POA: Diagnosis not present

## 2022-11-25 DIAGNOSIS — R600 Localized edema: Secondary | ICD-10-CM | POA: Diagnosis not present

## 2022-11-25 DIAGNOSIS — R233 Spontaneous ecchymoses: Secondary | ICD-10-CM

## 2022-11-25 NOTE — Patient Instructions (Signed)
Keep wound dry for the first 24 hours then clean daily with soap and water for one week. Apply vaseline daily for 4-5 days. Keep covered with clean dressing for 4-5 days. Follow up promptly for any signs of infection such as redness, warmth, pain, or drainage. Return in one week for suture removal.   We are sending off punch biopsy  We will send notes over to your dermatologist.

## 2022-11-25 NOTE — Progress Notes (Signed)
Established Patient Office Visit  Subjective   Patient ID: Kristen Escobar, female    DOB: [birth year]  Age: 77 y.o. MRN: [id number]  Chief Complaint  Patient presents with   Medical Management of Chronic Issues    HPI   [name] is seen for follow-up regarding bilateral leg, foot, and ankle rash.  Refer to last note for details:  11-18-22 note:  Kristen Escobar is seen today with bilateral foot and lower leg rash and some swelling involving both feet and legs.  She also had recent pustular lesion right calf which apparently is resolving.  Denies any recent tick bite.  She does not go outside any.  Did have some recent mild sore throat but no other respiratory symptoms.  She is recovering from complex pelvic fracture which occurred recent beach trip this past spring.  She is ambulating with a walker.     Her recent history is that she developed some dysuria and went to urology last week and diagnosed with UTI.  She was placed on Macrobid and is currently on day 5.  She thinks she may have had some low-grade fever.  No headaches.  No body aches.  No cough.  Not taking any other new medications other than the Macrobid.  Regular medications include amlodipine, levothyroxine, omeprazole   No dyspnea.  No orthopnea.  We obtained labs including CBC, basic metabolic panel, PT, PTT.  PTT was normal.  PTT only minimally elevated 1.2.  Hemoglobin improving.  Minimally elevated white count.  Normal platelets.  Kidney function and electrolytes stable.  Her fever has fully resolved.  She has completed full course of Macrobid which had been prescribed recently per urology.  That was her only new medication.  Other than the rash she is feeling well overall.  No progressive rash.  No fever.  No significant arthralgias.  No headache.  No abdominal pain.  Denies any nausea or vomiting.  Denies history of similar rash.  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   Diffuse large B cell lymphoma (HCC) dx'd 02/17/17   in remission 2 years now   had chemo    GERD (gastroesophageal reflux disease)    Heart murmur    07/03/21 echo: LVEF 60-65%, grade I DD, normal PASP, trivial MR, mild AI   History of radiation therapy 07/12/10,completed   right breast 60 Gy x30 fx   Hypertension    no meds currently   Hypothyroidism    Lymphoma (HCC)    Obesity    PE (pulmonary thromboembolism) (HCC) 01/2017   in setting of chemotherapy for lymphoma   Peripheral vascular disease (HCC) 1995   PT DEVELOPED CIRCULATION PROBLEMS IN BOTH HANDS AND GANGRENE OF BOTH INDEX FINGERS--REQUIRING AMPUTATION OF THE INDEX FINGERS AND VASCULAR SURGERY.  PT TOLD HER PROBLEMS RELATED TO SMOKING.   NO OTHER PROBLEMS SINCE.   Personal history of radiation therapy 2012   Right Breast Cancer   Pneumonia    Thyroid mass    Vocal cord paralysis    left   Past Surgical History:  Procedure Laterality Date   AMPUTATION     partial amputation of both index fingers   BREAST LUMPECTOMY Right 04/01/2010   BREAST SURGERY  2011  lumpectomy with node sampling- RIGHT   CATARACT EXTRACTION, BILATERAL  approx 2017   COLONOSCOPY  06/17/2022   per Dr. Adela Lank, one adenomatous polyp, no repeats needed   ESOPHAGOGASTRODUODENOSCOPY     EXCISION MASS NECK Left 12/26/2016   Procedure: EXCISION MASS NECK;  Surgeon: Serena Colonel, MD;  Location: [facility] OR;  Service: ENT;  Laterality: Left;  open excision of thyroid mass left side with frozen section   HAND SURGERY Bilateral 1995   Amputaed pointer fingers bilaterally   IR FLUORO GUIDE PORT INSERTION RIGHT  01/14/2017   IR GASTROSTOMY TUBE MOD SED  02/19/2017   IR GASTROSTOMY TUBE REMOVAL  12/07/2018   IR PATIENT EVAL TECH 0-60 MINS  01/14/2019   IR REMOVAL TUN ACCESS W/ PORT W/O FL  MOD SED  11/30/2017   IR REPLACE G-TUBE SIMPLE WO FLUORO  08/03/2017   IR REPLACE G-TUBE SIMPLE WO FLUORO  06/10/2018   IR REPLC GASTRO/COLONIC TUBE PERCUT W/FLUORO  02/25/2017   IR REPLC GASTRO/COLONIC TUBE PERCUT W/FLUORO  03/09/2017   IR US GUIDE BX ASP/DRAIN  12/07/2018   IR US GUIDE VASC ACCESS RIGHT  01/14/2017   JOINT REPLACEMENT     LAPAROSCOPIC GASTRIC RESECTION N/A 09/09/2019   Procedure: LAPAROSCOPIC CLOSURE OF GASTRO-CUTANEOUS FISTULA, EXCISION OF FISTUAL TRACK;  Surgeon: Ovidio Kin, MD;  Location: [facility];  Service: General;  Laterality: N/A;   LUMBAR LAMINECTOMY/DECOMPRESSION MICRODISCECTOMY N/A 01/30/2022   Procedure: Microdiscetomy hemilaminectomy decompression Lumbar Five-Sacral One;  Surgeon: Jene Every, MD;  Location: [facility] OR;  Service: Orthopedics;  Laterality: N/A;  90 mins 3 C-Bed   TONSILLECTOMY     TOTAL HIP ARTHROPLASTY  12/16/2011   right hip   TOTAL HIP ARTHROPLASTY  01/20/2012   Procedure: TOTAL HIP ARTHROPLASTY ANTERIOR APPROACH;  Surgeon: Shelda Pal, MD;  Location: [facility];  Service: Orthopedics;  Laterality: Left;   TOTAL KNEE ARTHROPLASTY Right 05/31/2018   Procedure: TOTAL KNEE ARTHROPLASTY;  Surgeon: Ollen Gross, MD;  Location: [facility];  Service: Orthopedics;  Laterality: Right;  Adductor Block   TUBAL LIGATION     VASCULAR SURGERY     both hands    reports that she quit smoking about 29 years ago. Her smoking use included cigarettes. She started smoking about 49 years ago. She has a 20 pack-year smoking history. She has never used smokeless tobacco. She reports that she does not currently use alcohol. She reports that she does not use drugs. family history includes Breast cancer in her sister; Cancer in her mother; Coronary artery disease in her brother, brother, and father; Diabetes in her father and sister; Hearing loss in her mother. No Known Allergies  Review of Systems  Constitutional:  Negative for chills and fever.  Respiratory:   Negative for cough and sputum production.   Cardiovascular:  Negative for chest pain.  Gastrointestinal:  Negative for abdominal pain, nausea and vomiting.  Genitourinary:  Negative for dysuria and hematuria.  Musculoskeletal:  Negative for joint pain.  Skin:  Positive for rash.  Neurological:  Negative for dizziness and headaches.      Objective:     BP (!) 142/60 (BP Location: Left Arm, Patient Position: Sitting, Cuff Size: Large)   Pulse 72   Temp (!) 97.5 F (36.4 C) (Oral)   Ht 5\' 2"  (1.575 m)   SpO2 98%   BMI 33.47 kg/m  BP Readings from Last 3 Encounters:  11/25/22 (!) 142/60  11/18/22 112/60  09/08/22 (!) 113/55   Wt Readings from Last  3 Encounters:  10/09/22 183 lb (83 kg)  09/03/22 183 lb 3.2 oz (83.1 kg)  07/25/22 181 lb 1.6 oz (82.1 kg)      Physical Exam Vitals reviewed.  Constitutional:      Appearance: Normal appearance.  Cardiovascular:     Rate and Rhythm: Normal rate and regular rhythm.  Pulmonary:     Effort: Pulmonary effort is normal.     Breath sounds: Normal breath sounds.  Skin:    Findings: Rash present.     Comments: She has nonblanching reddish to purplish petechial lesions scattered on her lower legs feet and ankles bilaterally.  These are nonpalpable.  No involvement of the upper extremities or trunk  Neurological:     Mental Status: She is alert.      No results found for any visits on 11/25/22.  Last CBC Lab Results  Component Value Date   WBC 11.5 (H) 11/18/2022   HGB 11.1 (L) 11/18/2022   HCT 35.1 (L) 11/18/2022   MCV 78.4 11/18/2022   MCH 28.0 09/08/2022   RDW 16.1 (H) 11/18/2022   PLT 414.0 (H) 11/18/2022   Last metabolic panel Lab Results  Component Value Date   GLUCOSE 97 11/18/2022   NA 135 11/18/2022   K 3.9 11/18/2022   CL 101 11/18/2022   CO2 24 11/18/2022   BUN 39 (H) 11/18/2022   CREATININE 0.93 11/18/2022   GFR 59.82 (L) 11/18/2022   CALCIUM 9.2 11/18/2022   PHOS 2.5 09/01/2022   PROT 7.1  08/30/2022   ALBUMIN 3.0 (L) 08/30/2022   BILITOT 0.9 08/30/2022   ALKPHOS 65 08/30/2022   AST 33 08/30/2022   ALT 6 08/30/2022   ANIONGAP 9 09/08/2022      The 10-year ASCVD risk score (Arnett DK, et al., 2019) is: 27.8%    Assessment & Plan:   Patient presents with now approximately 10-day history of petechiae lower legs and ?vasculitic type rash which started a few days after starting Macrobid.  Not clear if these are related.  Her rash has persisted but she denies any new symptoms.  Fever has resolved.  Recent labs unrevealing.  -We discussed getting skin punch biopsy for further information to further characterize her rash.  Discussed risk of bleeding, bruising, low risk of infection and patient consented.  Skin was prepped with Betadine.  Local anesthesia with 1% plain Xylocaine.  Using 4 mm punch biopsy obtained biopsy specimen with minimal bleeding.  1 suture of 4-0 Ethilon for closure.  Topical Vaseline and bandage applied along with wound care instructions given -Return in 1 week for suture removal -She is in process of trying to get into see her dermatologist. -Would also consider further labs including sed rate, C-reactive protein, serum cryoglobulins, urinalysis, ANA, ANCA, complement levels -If rash persist consider rheumatology consult -Follow-up immediately for any recurrent fever, increased arthralgias, or other new concerns   No follow-ups on file.    Evelena Peat, MD

## 2022-11-26 DIAGNOSIS — M310 Hypersensitivity angiitis: Secondary | ICD-10-CM | POA: Diagnosis not present

## 2022-11-26 DIAGNOSIS — L0100 Impetigo, unspecified: Secondary | ICD-10-CM | POA: Diagnosis not present

## 2022-11-26 DIAGNOSIS — L0889 Other specified local infections of the skin and subcutaneous tissue: Secondary | ICD-10-CM | POA: Diagnosis not present

## 2022-11-28 DIAGNOSIS — M9701XD Periprosthetic fracture around internal prosthetic right hip joint, subsequent encounter: Secondary | ICD-10-CM | POA: Diagnosis not present

## 2022-11-28 DIAGNOSIS — N1330 Unspecified hydronephrosis: Secondary | ICD-10-CM | POA: Diagnosis not present

## 2022-11-28 DIAGNOSIS — R339 Retention of urine, unspecified: Secondary | ICD-10-CM | POA: Diagnosis not present

## 2022-11-28 DIAGNOSIS — I129 Hypertensive chronic kidney disease with stage 1 through stage 4 chronic kidney disease, or unspecified chronic kidney disease: Secondary | ICD-10-CM | POA: Diagnosis not present

## 2022-11-28 DIAGNOSIS — N189 Chronic kidney disease, unspecified: Secondary | ICD-10-CM | POA: Diagnosis not present

## 2022-11-28 DIAGNOSIS — E039 Hypothyroidism, unspecified: Secondary | ICD-10-CM | POA: Diagnosis not present

## 2022-12-01 ENCOUNTER — Ambulatory Visit (INDEPENDENT_AMBULATORY_CARE_PROVIDER_SITE_OTHER): Payer: Medicare Other | Admitting: Family Medicine

## 2022-12-01 ENCOUNTER — Encounter: Payer: Self-pay | Admitting: Family Medicine

## 2022-12-01 VITALS — BP 130/64 | HR 75 | Temp 97.8°F | Ht 62.0 in | Wt 175.0 lb

## 2022-12-01 DIAGNOSIS — I10 Essential (primary) hypertension: Secondary | ICD-10-CM | POA: Diagnosis not present

## 2022-12-01 DIAGNOSIS — N1831 Chronic kidney disease, stage 3a: Secondary | ICD-10-CM

## 2022-12-01 DIAGNOSIS — M9701XD Periprosthetic fracture around internal prosthetic right hip joint, subsequent encounter: Secondary | ICD-10-CM | POA: Diagnosis not present

## 2022-12-01 DIAGNOSIS — I776 Arteritis, unspecified: Secondary | ICD-10-CM

## 2022-12-01 DIAGNOSIS — N189 Chronic kidney disease, unspecified: Secondary | ICD-10-CM | POA: Diagnosis not present

## 2022-12-01 DIAGNOSIS — E039 Hypothyroidism, unspecified: Secondary | ICD-10-CM | POA: Diagnosis not present

## 2022-12-01 DIAGNOSIS — I129 Hypertensive chronic kidney disease with stage 1 through stage 4 chronic kidney disease, or unspecified chronic kidney disease: Secondary | ICD-10-CM | POA: Diagnosis not present

## 2022-12-01 DIAGNOSIS — N1330 Unspecified hydronephrosis: Secondary | ICD-10-CM | POA: Diagnosis not present

## 2022-12-01 DIAGNOSIS — R339 Retention of urine, unspecified: Secondary | ICD-10-CM | POA: Diagnosis not present

## 2022-12-01 MED ORDER — AMLODIPINE BESYLATE 5 MG PO TABS
5.0000 mg | ORAL_TABLET | Freq: Every day | ORAL | 3 refills | Status: DC
Start: 2022-12-01 — End: 2023-11-09

## 2022-12-01 NOTE — Patient Instructions (Signed)
Stop the Furosemide  Lets continue with the Amlodipine 5 mg daily  Monitor blood pressure and be in touch if BP consistently > 140/90.

## 2022-12-01 NOTE — Progress Notes (Signed)
Established Patient Office Visit  Subjective   Patient ID: Kristen Escobar, female    DOB: 06-Nov-1945  Age: 77 y.o. MRN: [id number]  Chief Complaint  Patient presents with   Follow-up    Suture remove and pathology result    HPI   Kristen Escobar is seen today for medical follow-up regarding recent vasculitic type rash involving her feet, ankles, legs.  This occurred in the setting of taking Macrobid and started a few days after she started the Macrobid.  We did consider that this may be a drug-induced vasculitic type rash versus other cause.  Initial lab screening here was unremarkable.  We performed a punch biopsy last week and this is healing well.  Here today for suture removal.  She was able to get into see her dermatologist the day after her punch biopsy and they concur that this may be related to Macrobid.  She was placed on prednisone orally and rash is dramatically improved at this time.  Petechiae are fading tremendously.  She has no new areas.  She has had low to easy bruising on her forearms and we mention that prednisone can also worsen that.  No fever.  No further dysuria.  She has hypertension and has been on amlodipine 10 mg daily.  Significant increased peripheral edema recently probably at least in part due to vasculitic type process.  We had to reduce her amlodipine from 10 mg to 5 mg daily and also had started low-dose furosemide 20 mg daily.  Her edema essentially resolved.  Her blood pressure has been stable on 5 mg amlodipine.  No headaches or dizziness.  She is still recovering from complex right lower extremity fracture and hopes to increase weightbearing soon.  She has had extensive physical therapy.  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 05-2018   Diffuse large B cell lymphoma (HCC) dx'd 02/17/17   in remission 2 years now   had chemo    GERD (gastroesophageal reflux disease)    Heart murmur    07/03/21 echo: LVEF 60-65%, grade I DD, normal PASP, trivial MR, mild AI   History of radiation therapy 07/12/10,completed   right breast 60 Gy x30 fx   Hypertension    no meds currently   Hypothyroidism    Lymphoma (HCC)    Obesity    PE (pulmonary thromboembolism) (HCC) 01/2017   in setting of chemotherapy for lymphoma   Peripheral vascular disease (HCC) 1995   PT DEVELOPED CIRCULATION PROBLEMS IN BOTH HANDS AND GANGRENE OF BOTH INDEX FINGERS--REQUIRING AMPUTATION OF THE INDEX FINGERS AND VASCULAR SURGERY.  PT TOLD HER PROBLEMS RELATED TO SMOKING.   NO OTHER PROBLEMS SINCE.   Personal history of radiation therapy 2012   Right Breast Cancer   Pneumonia    Thyroid mass    Vocal cord paralysis    left   Past Surgical History:  Procedure Laterality Date   AMPUTATION     partial amputation of both index fingers   BREAST LUMPECTOMY Right 04/01/2010   BREAST SURGERY  2011   lumpectomy with node sampling- RIGHT   CATARACT EXTRACTION, BILATERAL  approx 2017   COLONOSCOPY  06/17/2022  per Dr. Adela Lank, one adenomatous polyp, no repeats needed   ESOPHAGOGASTRODUODENOSCOPY     EXCISION MASS NECK Left 12/26/2016   Procedure: EXCISION MASS NECK;  Surgeon: Serena Colonel, MD;  Location: [facility] OR;  Service: ENT;  Laterality: Left;  open excision of thyroid mass left side with frozen section   HAND SURGERY Bilateral 1995   Amputaed pointer fingers bilaterally   IR FLUORO GUIDE PORT INSERTION RIGHT  01/14/2017   IR GASTROSTOMY TUBE MOD SED  02/19/2017   IR GASTROSTOMY TUBE REMOVAL  12/07/2018   IR PATIENT EVAL TECH 0-60 MINS  01/14/2019   IR REMOVAL TUN ACCESS W/ PORT W/O FL MOD SED  11/30/2017   IR REPLACE G-TUBE SIMPLE WO FLUORO  08/03/2017   IR REPLACE G-TUBE SIMPLE WO FLUORO  06/10/2018   IR  REPLC GASTRO/COLONIC TUBE PERCUT W/FLUORO  02/25/2017   IR REPLC GASTRO/COLONIC TUBE PERCUT W/FLUORO  03/09/2017   IR US GUIDE BX ASP/DRAIN  12/07/2018   IR US GUIDE VASC ACCESS RIGHT  01/14/2017   JOINT REPLACEMENT     LAPAROSCOPIC GASTRIC RESECTION N/A 09/09/2019   Procedure: LAPAROSCOPIC CLOSURE OF GASTRO-CUTANEOUS FISTULA, EXCISION OF FISTUAL TRACK;  Surgeon: Ovidio Kin, MD;  Location: [facility] OR[facility];  Service: General;  Laterality: N/A;   LUMBAR LAMINECTOMY/DECOMPRESSION MICRODISCECTOMY N/A 01/30/2022   Procedure: Microdiscetomy hemilaminectomy decompression Lumbar Five-Sacral One;  Surgeon: Jene Every, MD;  Location: [facility] OR;  Service: Orthopedics;  Laterality: N/A;  90 mins 3 C-Bed   TONSILLECTOMY     TOTAL HIP ARTHROPLASTY  12/16/2011   right hip   TOTAL HIP ARTHROPLASTY  01/20/2012   Procedure: TOTAL HIP ARTHROPLASTY ANTERIOR APPROACH;  Surgeon: Shelda Pal, MD;  Location: [facility];  Service: Orthopedics;  Laterality: Left;   TOTAL KNEE ARTHROPLASTY Right 05/31/2018   Procedure: TOTAL KNEE ARTHROPLASTY;  Surgeon: Ollen Gross, MD;  Location: [facility];  Service: Orthopedics;  Laterality: Right;  Adductor Block   TUBAL LIGATION     VASCULAR SURGERY     both hands    reports that she quit smoking about 29 years ago. Her smoking use included cigarettes. She started smoking about 49 years ago. She has a 20 pack-year smoking history. She has never used smokeless tobacco. She reports that she does not currently use alcohol. She reports that she does not use drugs. family history includes Breast cancer in her sister; Cancer in her mother; Coronary artery disease in her brother, brother, and father; Diabetes in her father and sister; Hearing loss in her mother. Allergies  Allergen Reactions   Macrobid [Nitrofurantoin] Rash    Vasculitic rash    Review of Systems  Constitutional:  Negative for malaise/fatigue.  Eyes:  Negative for blurred vision.  Respiratory:  Negative for shortness of  breath.   Cardiovascular:  Negative for chest pain.  Skin:  Positive for rash.  Neurological:  Negative for dizziness, weakness and headaches.      Objective:     BP 130/64 (BP Location: Right Arm, Patient Position: Sitting, Cuff Size: Large)   Pulse 75   Temp 97.8 F (36.6 C) (Oral)   Ht 5\' 2"  (1.575 m)   Wt 175 lb (79.4 kg)   SpO2 98%   BMI 32.01 kg/m  BP Readings from Last 3 Encounters:  12/01/22 130/64  11/25/22 (!) 142/60  11/18/22 112/60   Wt Readings from Last 3 Encounters:  12/01/22 175 lb (79.4 kg)  10/09/22 183 lb (83 kg)  09/03/22 183 lb 3.2 oz (83.1 kg)  Physical Exam Vitals reviewed.  Constitutional:      Appearance: Normal appearance.  Cardiovascular:     Rate and Rhythm: Normal rate and regular rhythm.  Pulmonary:     Effort: Pulmonary effort is normal.     Breath sounds: Normal breath sounds.  Musculoskeletal:     Right lower leg: No edema.     Left lower leg: No edema.  Skin:    Comments: Her petechial and vasculitic type rash lower extremities has all but resolved.  She has little bit of residual rash left lateral ankle region.  Edema lower extremities has also resolved  Biopsy site left lateral leg examined.  1 suture removed.  Healing well.  No erythema or drainage.  Neurological:     Mental Status: She is alert.      No results found for any visits on 12/01/22.  Last CBC Lab Results  Component Value Date   WBC 11.5 (H) 11/18/2022   HGB 11.1 (L) 11/18/2022   HCT 35.1 (L) 11/18/2022   MCV 78.4 11/18/2022   MCH 28.0 09/08/2022   RDW 16.1 (H) 11/18/2022   PLT 414.0 (H) 11/18/2022   Last metabolic panel Lab Results  Component Value Date   GLUCOSE 97 11/18/2022   NA 135 11/18/2022   K 3.9 11/18/2022   CL 101 11/18/2022   CO2 24 11/18/2022   BUN 39 (H) 11/18/2022   CREATININE 0.93 11/18/2022   GFR 59.82 (L) 11/18/2022   CALCIUM 9.2 11/18/2022   PHOS 2.5 09/01/2022   PROT 7.1 08/30/2022   ALBUMIN 3.0 (L) 08/30/2022    BILITOT 0.9 08/30/2022   ALKPHOS 65 08/30/2022   AST 33 08/30/2022   ALT 6 08/30/2022   ANIONGAP 9 09/08/2022      The 10-year ASCVD risk score (Arnett DK, et al., 2019) is: 23.8%    Assessment & Plan:   Problem List Items Addressed This Visit       Unprioritized   Essential hypertension   Relevant Medications   amLODipine (NORVASC) 5 MG tablet   Chronic kidney disease   Other Visit Diagnoses     Vasculitis (HCC)    -  Primary   Relevant Medications   amLODipine (NORVASC) 5 MG tablet     Recent vasculitic rash possibly related to Macrobid greatly improving off Macrobid and now on prednisone  -Finish out prednisone dose -Stressed importance of avoidance of Macrobid in the future and they are aware -Continue lower dose amlodipine 5 mg daily which is controlling her blood pressure adequately.  Continue to monitor blood pressure and be in touch if excessive greater than 140/90 -Discontinue furosemide at this time-and watch for any increased peripheral edema -1 suture removed from left leg biopsy site.  This is healing well with no signs of secondary infection -Be in touch if she has any recurrent petechiae or other concerns after discontinuing the prednisone  No follow-ups on file.    Evelena Peat, MD

## 2022-12-03 DIAGNOSIS — E039 Hypothyroidism, unspecified: Secondary | ICD-10-CM | POA: Diagnosis not present

## 2022-12-03 DIAGNOSIS — N189 Chronic kidney disease, unspecified: Secondary | ICD-10-CM | POA: Diagnosis not present

## 2022-12-03 DIAGNOSIS — N1330 Unspecified hydronephrosis: Secondary | ICD-10-CM | POA: Diagnosis not present

## 2022-12-03 DIAGNOSIS — I129 Hypertensive chronic kidney disease with stage 1 through stage 4 chronic kidney disease, or unspecified chronic kidney disease: Secondary | ICD-10-CM | POA: Diagnosis not present

## 2022-12-03 DIAGNOSIS — R339 Retention of urine, unspecified: Secondary | ICD-10-CM | POA: Diagnosis not present

## 2022-12-03 DIAGNOSIS — M9701XD Periprosthetic fracture around internal prosthetic right hip joint, subsequent encounter: Secondary | ICD-10-CM | POA: Diagnosis not present

## 2022-12-04 DIAGNOSIS — Z8701 Personal history of pneumonia (recurrent): Secondary | ICD-10-CM | POA: Diagnosis not present

## 2022-12-04 DIAGNOSIS — Z853 Personal history of malignant neoplasm of breast: Secondary | ICD-10-CM | POA: Diagnosis not present

## 2022-12-04 DIAGNOSIS — Z96651 Presence of right artificial knee joint: Secondary | ICD-10-CM | POA: Diagnosis not present

## 2022-12-04 DIAGNOSIS — K219 Gastro-esophageal reflux disease without esophagitis: Secondary | ICD-10-CM | POA: Diagnosis not present

## 2022-12-04 DIAGNOSIS — Z9181 History of falling: Secondary | ICD-10-CM | POA: Diagnosis not present

## 2022-12-04 DIAGNOSIS — M1990 Unspecified osteoarthritis, unspecified site: Secondary | ICD-10-CM | POA: Diagnosis not present

## 2022-12-04 DIAGNOSIS — S7291XA Unspecified fracture of right femur, initial encounter for closed fracture: Secondary | ICD-10-CM | POA: Diagnosis not present

## 2022-12-04 DIAGNOSIS — N138 Other obstructive and reflux uropathy: Secondary | ICD-10-CM | POA: Diagnosis not present

## 2022-12-04 DIAGNOSIS — M5416 Radiculopathy, lumbar region: Secondary | ICD-10-CM | POA: Diagnosis not present

## 2022-12-04 DIAGNOSIS — E039 Hypothyroidism, unspecified: Secondary | ICD-10-CM | POA: Diagnosis not present

## 2022-12-04 DIAGNOSIS — Z96642 Presence of left artificial hip joint: Secondary | ICD-10-CM | POA: Diagnosis not present

## 2022-12-04 DIAGNOSIS — N1330 Unspecified hydronephrosis: Secondary | ICD-10-CM | POA: Diagnosis not present

## 2022-12-04 DIAGNOSIS — Z86711 Personal history of pulmonary embolism: Secondary | ICD-10-CM | POA: Diagnosis not present

## 2022-12-04 DIAGNOSIS — Z87440 Personal history of urinary (tract) infections: Secondary | ICD-10-CM | POA: Diagnosis not present

## 2022-12-04 DIAGNOSIS — R339 Retention of urine, unspecified: Secondary | ICD-10-CM | POA: Diagnosis not present

## 2022-12-04 DIAGNOSIS — M9701XD Periprosthetic fracture around internal prosthetic right hip joint, subsequent encounter: Secondary | ICD-10-CM | POA: Diagnosis not present

## 2022-12-04 DIAGNOSIS — N189 Chronic kidney disease, unspecified: Secondary | ICD-10-CM | POA: Diagnosis not present

## 2022-12-04 DIAGNOSIS — I129 Hypertensive chronic kidney disease with stage 1 through stage 4 chronic kidney disease, or unspecified chronic kidney disease: Secondary | ICD-10-CM | POA: Diagnosis not present

## 2022-12-04 DIAGNOSIS — I739 Peripheral vascular disease, unspecified: Secondary | ICD-10-CM | POA: Diagnosis not present

## 2022-12-05 DIAGNOSIS — M9701XD Periprosthetic fracture around internal prosthetic right hip joint, subsequent encounter: Secondary | ICD-10-CM | POA: Diagnosis not present

## 2022-12-05 DIAGNOSIS — I129 Hypertensive chronic kidney disease with stage 1 through stage 4 chronic kidney disease, or unspecified chronic kidney disease: Secondary | ICD-10-CM | POA: Diagnosis not present

## 2022-12-05 DIAGNOSIS — R339 Retention of urine, unspecified: Secondary | ICD-10-CM | POA: Diagnosis not present

## 2022-12-05 DIAGNOSIS — E039 Hypothyroidism, unspecified: Secondary | ICD-10-CM | POA: Diagnosis not present

## 2022-12-05 DIAGNOSIS — N1330 Unspecified hydronephrosis: Secondary | ICD-10-CM | POA: Diagnosis not present

## 2022-12-05 DIAGNOSIS — N189 Chronic kidney disease, unspecified: Secondary | ICD-10-CM | POA: Diagnosis not present

## 2022-12-09 ENCOUNTER — Telehealth: Payer: Self-pay | Admitting: Family Medicine

## 2022-12-09 DIAGNOSIS — I129 Hypertensive chronic kidney disease with stage 1 through stage 4 chronic kidney disease, or unspecified chronic kidney disease: Secondary | ICD-10-CM | POA: Diagnosis not present

## 2022-12-09 DIAGNOSIS — N189 Chronic kidney disease, unspecified: Secondary | ICD-10-CM | POA: Diagnosis not present

## 2022-12-09 DIAGNOSIS — N1330 Unspecified hydronephrosis: Secondary | ICD-10-CM | POA: Diagnosis not present

## 2022-12-09 DIAGNOSIS — E039 Hypothyroidism, unspecified: Secondary | ICD-10-CM | POA: Diagnosis not present

## 2022-12-09 DIAGNOSIS — M9701XD Periprosthetic fracture around internal prosthetic right hip joint, subsequent encounter: Secondary | ICD-10-CM | POA: Diagnosis not present

## 2022-12-09 DIAGNOSIS — R339 Retention of urine, unspecified: Secondary | ICD-10-CM | POA: Diagnosis not present

## 2022-12-09 NOTE — Telephone Encounter (Signed)
Spoke with patient.  She has no fever at this time.  Drinking fluids well.  No nausea or vomiting.  Urine a little dark but no burning with urination.  Will keep appointment tomorrow to recheck urine to rule out infection  Kristian Covey MD [facility]

## 2022-12-09 NOTE — Telephone Encounter (Signed)
Pt has possible UTI, running a fever, dark urine, asking if a urine sample can be dropped off, a prescription written or if she should just drink a lot of water. Pls call to advise

## 2022-12-10 ENCOUNTER — Encounter: Payer: Self-pay | Admitting: Family Medicine

## 2022-12-10 ENCOUNTER — Ambulatory Visit (INDEPENDENT_AMBULATORY_CARE_PROVIDER_SITE_OTHER): Payer: Medicare Other | Admitting: Family Medicine

## 2022-12-10 VITALS — BP 106/60 | HR 85 | Temp 98.1°F | Ht 62.0 in

## 2022-12-10 DIAGNOSIS — R3989 Other symptoms and signs involving the genitourinary system: Secondary | ICD-10-CM

## 2022-12-10 LAB — POC URINALSYSI DIPSTICK (AUTOMATED)
Bilirubin, UA: NEGATIVE
Glucose, UA: NEGATIVE
Ketones, UA: NEGATIVE
Leukocytes, UA: NEGATIVE
Nitrite, UA: NEGATIVE
Protein, UA: POSITIVE — AB
Spec Grav, UA: 1.015 (ref 1.010–1.025)
Urobilinogen, UA: 0.2 E.U./dL
pH, UA: 5.5 (ref 5.0–8.0)

## 2022-12-10 NOTE — Patient Instructions (Addendum)
We are sending urine for culture and urine microscopy  Let me if you have any fever or burning with urination.    Be sure to see Urologist in September.

## 2022-12-10 NOTE — Progress Notes (Signed)
Established Patient Office Visit  Subjective   Patient ID: Kristen Escobar, female    DOB: 21-Jul-1945  Age: 77 y.o. MRN: [id number]  Chief Complaint  Patient presents with   Urinary Tract Infection    HPI   Kristen Escobar is seen to reassess urine.  She had called that she was having very dark-colored urine.  Earlier in the week she had temperature up between 99 and 100 range.  No burning with urination.  No flank pain.  Her recent history of been that back in the [place] with complex pelvic fracture.  She had prolonged Foley catheter.  Imaging when she was in the hospital in May showed right bladder wall mass versus hematoma.  Her catheter was removed and after this had been out she had follow-up cystoscopy with urologist and apparently no concern for cancer.  There are plans to repeat cystoscopy in September.  She did have UTI and was placed on Macrobid per urologist a few weeks ago.  She developed subsequent vasculitic rash feet and legs.  This eventually resolved on prednisone.  She has had no fever whatsoever past couple days.  Still has slightly dark-colored urine.  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   Diffuse large B cell lymphoma (HCC) dx'd 02/17/17   in remission 2 years now   had chemo    GERD (gastroesophageal reflux disease)    Heart murmur    07/03/21 echo: LVEF 60-65%, grade I DD, normal PASP, trivial MR, mild AI   History of radiation therapy 07/12/10,completed   right breast 60 Gy x30 fx   Hypertension    no meds currently   Hypothyroidism    Lymphoma (HCC)    Obesity    PE (pulmonary thromboembolism) (HCC) 01/2017   in setting of chemotherapy for lymphoma   Peripheral vascular disease (HCC) 1995   PT DEVELOPED  CIRCULATION PROBLEMS IN BOTH HANDS AND GANGRENE OF BOTH INDEX FINGERS--REQUIRING AMPUTATION OF THE INDEX FINGERS AND VASCULAR SURGERY.  PT TOLD HER PROBLEMS RELATED TO SMOKING.   NO OTHER PROBLEMS SINCE.   Personal history of radiation therapy 2012   Right Breast Cancer   Pneumonia    Thyroid mass    Vocal cord paralysis    left   Past Surgical History:  Procedure Laterality Date   AMPUTATION     partial amputation of both index fingers   BREAST LUMPECTOMY Right 04/01/2010   BREAST SURGERY  2011   lumpectomy with node sampling- RIGHT   CATARACT EXTRACTION, BILATERAL  approx 2017   COLONOSCOPY  06/17/2022   per Dr. Adela Lank, one adenomatous polyp, no repeats needed   ESOPHAGOGASTRODUODENOSCOPY     EXCISION MASS NECK Left 12/26/2016   Procedure: EXCISION MASS NECK;  Surgeon: Serena Colonel, MD;  Location: [facility] OR;  Service: ENT;  Laterality: Left;  open excision of thyroid mass left side with frozen section   HAND SURGERY Bilateral 1995   Amputaed pointer fingers bilaterally   IR FLUORO GUIDE PORT INSERTION RIGHT  01/14/2017   IR GASTROSTOMY TUBE MOD SED  02/19/2017   IR GASTROSTOMY TUBE REMOVAL  12/07/2018   IR PATIENT EVAL TECH 0-60 MINS  01/14/2019   IR REMOVAL TUN ACCESS W/  PORT W/O FL MOD SED  11/30/2017   IR REPLACE G-TUBE SIMPLE WO FLUORO  08/03/2017   IR REPLACE G-TUBE SIMPLE WO FLUORO  06/10/2018   IR REPLC GASTRO/COLONIC TUBE PERCUT W/FLUORO  02/25/2017   IR REPLC GASTRO/COLONIC TUBE PERCUT W/FLUORO  03/09/2017   IR US GUIDE BX ASP/DRAIN  12/07/2018   IR US GUIDE VASC ACCESS RIGHT  01/14/2017   JOINT REPLACEMENT     LAPAROSCOPIC GASTRIC RESECTION N/A 09/09/2019   Procedure: LAPAROSCOPIC CLOSURE OF GASTRO-CUTANEOUS FISTULA, EXCISION OF FISTUAL TRACK;  Surgeon: Ovidio Kin, MD;  Location: [facility];  Service: General;  Laterality: N/A;   LUMBAR LAMINECTOMY/DECOMPRESSION MICRODISCECTOMY N/A 01/30/2022   Procedure: Microdiscetomy hemilaminectomy decompression Lumbar Five-Sacral  One;  Surgeon: Jene Every, MD;  Location: [facility] OR;  Service: Orthopedics;  Laterality: N/A;  90 mins 3 C-Bed   TONSILLECTOMY     TOTAL HIP ARTHROPLASTY  12/16/2011   right hip   TOTAL HIP ARTHROPLASTY  01/20/2012   Procedure: TOTAL HIP ARTHROPLASTY ANTERIOR APPROACH;  Surgeon: Shelda Pal, MD;  Location: [facility];  Service: Orthopedics;  Laterality: Left;   TOTAL KNEE ARTHROPLASTY Right 05/31/2018   Procedure: TOTAL KNEE ARTHROPLASTY;  Surgeon: Ollen Gross, MD;  Location: [facility];  Service: Orthopedics;  Laterality: Right;  Adductor Block   TUBAL LIGATION     VASCULAR SURGERY     both hands    reports that she quit smoking about 29 years ago. Her smoking use included cigarettes. She started smoking about 49 years ago. She has a 20 pack-year smoking history. She has never used smokeless tobacco. She reports that she does not currently use alcohol. She reports that she does not use drugs. family history includes Breast cancer in her sister; Cancer in her mother; Coronary artery disease in her brother, brother, and father; Diabetes in her father and sister; Hearing loss in her mother. Allergies  Allergen Reactions   Macrobid [Nitrofurantoin] Rash    Vasculitic rash    Review of Systems  Constitutional:  Negative for chills and fever.  Gastrointestinal:  Negative for abdominal pain, nausea and vomiting.  Genitourinary:  Negative for dysuria, flank pain and frequency.      Objective:     BP 106/60 (BP Location: Left Arm, Patient Position: Sitting, Cuff Size: Large)   Pulse 85   Temp 98.1 F (36.7 C) (Oral)   Ht 5\' 2"  (1.575 m)   SpO2 98%   BMI 32.01 kg/m  BP Readings from Last 3 Encounters:  12/10/22 106/60  12/01/22 130/64  11/25/22 (!) 142/60   Wt Readings from Last 3 Encounters:  12/01/22 175 lb (79.4 kg)  10/09/22 183 lb (83 kg)  09/03/22 183 lb 3.2 oz (83.1 kg)      Physical Exam Vitals reviewed.  Constitutional:      Appearance: Normal appearance.   Cardiovascular:     Rate and Rhythm: Normal rate and regular rhythm.  Pulmonary:     Effort: Pulmonary effort is normal.     Breath sounds: Normal breath sounds.  Neurological:     Mental Status: She is alert.      Results for orders placed or performed in visit on 12/10/22  POCT Urinalysis Dipstick (Automated)  Result Value Ref Range   Color, UA brown    Clarity, UA cloudy    Glucose, UA Negative Negative   Bilirubin, UA negative    Ketones, UA negative    Spec Grav, UA 1.015 1.010 - 1.025   Blood, UA large  pH, UA 5.5 5.0 - 8.0   Protein, UA Positive (A) Negative   Urobilinogen, UA 0.2 0.2 or 1.0 E.U./dL   Nitrite, UA negative    Leukocytes, UA Negative Negative      The 10-year ASCVD risk score (Arnett DK, et al., 2019) is: 16.5%    Assessment & Plan:   Patient relates discolored urine past few days with darker urine than usual but no burning with urination and no fever.  Urine dipstick today shows large blood but negative nitrite and leukocytes. -Urine culture and urine microscopy sent. -She already has scheduled follow-up with urologist in September for repeat cystoscopy.  There had been comment of some right bladder wall thickening but no evidence for cancer on recent cystoscopy. -Recent renal ultrasound and CT imaging did not show any kidney masses. -Stay well-hydrated -Follow-up immediately for any fever or dysuria   No follow-ups on file.    Evelena Peat, MD

## 2022-12-11 ENCOUNTER — Other Ambulatory Visit: Payer: Self-pay | Admitting: Family Medicine

## 2022-12-11 LAB — URINALYSIS, ROUTINE W REFLEX MICROSCOPIC
Bilirubin Urine: NEGATIVE
Ketones, ur: NEGATIVE
Leukocytes,Ua: NEGATIVE
Nitrite: NEGATIVE
Specific Gravity, Urine: 1.015 (ref 1.000–1.030)
Total Protein, Urine: 100 — AB
Urine Glucose: NEGATIVE
Urobilinogen, UA: 0.2 (ref 0.0–1.0)
pH: 6 (ref 5.0–8.0)

## 2022-12-11 LAB — URINE CULTURE
MICRO NUMBER:: 15362367
Result:: NO GROWTH
SPECIMEN QUALITY:: ADEQUATE

## 2022-12-12 DIAGNOSIS — I129 Hypertensive chronic kidney disease with stage 1 through stage 4 chronic kidney disease, or unspecified chronic kidney disease: Secondary | ICD-10-CM | POA: Diagnosis not present

## 2022-12-12 DIAGNOSIS — M9701XD Periprosthetic fracture around internal prosthetic right hip joint, subsequent encounter: Secondary | ICD-10-CM | POA: Diagnosis not present

## 2022-12-12 DIAGNOSIS — Z471 Aftercare following joint replacement surgery: Secondary | ICD-10-CM | POA: Diagnosis not present

## 2022-12-12 DIAGNOSIS — R339 Retention of urine, unspecified: Secondary | ICD-10-CM | POA: Diagnosis not present

## 2022-12-12 DIAGNOSIS — N189 Chronic kidney disease, unspecified: Secondary | ICD-10-CM | POA: Diagnosis not present

## 2022-12-12 DIAGNOSIS — E039 Hypothyroidism, unspecified: Secondary | ICD-10-CM | POA: Diagnosis not present

## 2022-12-12 DIAGNOSIS — N1330 Unspecified hydronephrosis: Secondary | ICD-10-CM | POA: Diagnosis not present

## 2022-12-16 DIAGNOSIS — R339 Retention of urine, unspecified: Secondary | ICD-10-CM | POA: Diagnosis not present

## 2022-12-16 DIAGNOSIS — M9701XD Periprosthetic fracture around internal prosthetic right hip joint, subsequent encounter: Secondary | ICD-10-CM | POA: Diagnosis not present

## 2022-12-16 DIAGNOSIS — N189 Chronic kidney disease, unspecified: Secondary | ICD-10-CM | POA: Diagnosis not present

## 2022-12-16 DIAGNOSIS — I129 Hypertensive chronic kidney disease with stage 1 through stage 4 chronic kidney disease, or unspecified chronic kidney disease: Secondary | ICD-10-CM | POA: Diagnosis not present

## 2022-12-16 DIAGNOSIS — E039 Hypothyroidism, unspecified: Secondary | ICD-10-CM | POA: Diagnosis not present

## 2022-12-16 DIAGNOSIS — N1330 Unspecified hydronephrosis: Secondary | ICD-10-CM | POA: Diagnosis not present

## 2022-12-19 DIAGNOSIS — I129 Hypertensive chronic kidney disease with stage 1 through stage 4 chronic kidney disease, or unspecified chronic kidney disease: Secondary | ICD-10-CM | POA: Diagnosis not present

## 2022-12-19 DIAGNOSIS — N189 Chronic kidney disease, unspecified: Secondary | ICD-10-CM | POA: Diagnosis not present

## 2022-12-19 DIAGNOSIS — N1330 Unspecified hydronephrosis: Secondary | ICD-10-CM | POA: Diagnosis not present

## 2022-12-19 DIAGNOSIS — E039 Hypothyroidism, unspecified: Secondary | ICD-10-CM | POA: Diagnosis not present

## 2022-12-19 DIAGNOSIS — M9701XD Periprosthetic fracture around internal prosthetic right hip joint, subsequent encounter: Secondary | ICD-10-CM | POA: Diagnosis not present

## 2022-12-19 DIAGNOSIS — R339 Retention of urine, unspecified: Secondary | ICD-10-CM | POA: Diagnosis not present

## 2022-12-23 DIAGNOSIS — I129 Hypertensive chronic kidney disease with stage 1 through stage 4 chronic kidney disease, or unspecified chronic kidney disease: Secondary | ICD-10-CM | POA: Diagnosis not present

## 2022-12-23 DIAGNOSIS — M9701XD Periprosthetic fracture around internal prosthetic right hip joint, subsequent encounter: Secondary | ICD-10-CM | POA: Diagnosis not present

## 2022-12-23 DIAGNOSIS — R339 Retention of urine, unspecified: Secondary | ICD-10-CM | POA: Diagnosis not present

## 2022-12-23 DIAGNOSIS — N1330 Unspecified hydronephrosis: Secondary | ICD-10-CM | POA: Diagnosis not present

## 2022-12-23 DIAGNOSIS — N189 Chronic kidney disease, unspecified: Secondary | ICD-10-CM | POA: Diagnosis not present

## 2022-12-23 DIAGNOSIS — E039 Hypothyroidism, unspecified: Secondary | ICD-10-CM | POA: Diagnosis not present

## 2022-12-26 DIAGNOSIS — N189 Chronic kidney disease, unspecified: Secondary | ICD-10-CM | POA: Diagnosis not present

## 2022-12-26 DIAGNOSIS — I129 Hypertensive chronic kidney disease with stage 1 through stage 4 chronic kidney disease, or unspecified chronic kidney disease: Secondary | ICD-10-CM | POA: Diagnosis not present

## 2022-12-26 DIAGNOSIS — R339 Retention of urine, unspecified: Secondary | ICD-10-CM | POA: Diagnosis not present

## 2022-12-26 DIAGNOSIS — N1330 Unspecified hydronephrosis: Secondary | ICD-10-CM | POA: Diagnosis not present

## 2022-12-26 DIAGNOSIS — E039 Hypothyroidism, unspecified: Secondary | ICD-10-CM | POA: Diagnosis not present

## 2022-12-26 DIAGNOSIS — M9701XD Periprosthetic fracture around internal prosthetic right hip joint, subsequent encounter: Secondary | ICD-10-CM | POA: Diagnosis not present

## 2022-12-30 DIAGNOSIS — L718 Other rosacea: Secondary | ICD-10-CM | POA: Diagnosis not present

## 2022-12-30 DIAGNOSIS — L814 Other melanin hyperpigmentation: Secondary | ICD-10-CM | POA: Diagnosis not present

## 2022-12-30 DIAGNOSIS — Z85828 Personal history of other malignant neoplasm of skin: Secondary | ICD-10-CM | POA: Diagnosis not present

## 2022-12-30 DIAGNOSIS — L905 Scar conditions and fibrosis of skin: Secondary | ICD-10-CM | POA: Diagnosis not present

## 2022-12-30 DIAGNOSIS — L570 Actinic keratosis: Secondary | ICD-10-CM | POA: Diagnosis not present

## 2022-12-30 DIAGNOSIS — L821 Other seborrheic keratosis: Secondary | ICD-10-CM | POA: Diagnosis not present

## 2022-12-30 DIAGNOSIS — D225 Melanocytic nevi of trunk: Secondary | ICD-10-CM | POA: Diagnosis not present

## 2022-12-31 DIAGNOSIS — N189 Chronic kidney disease, unspecified: Secondary | ICD-10-CM | POA: Diagnosis not present

## 2022-12-31 DIAGNOSIS — I129 Hypertensive chronic kidney disease with stage 1 through stage 4 chronic kidney disease, or unspecified chronic kidney disease: Secondary | ICD-10-CM | POA: Diagnosis not present

## 2022-12-31 DIAGNOSIS — R339 Retention of urine, unspecified: Secondary | ICD-10-CM | POA: Diagnosis not present

## 2022-12-31 DIAGNOSIS — E039 Hypothyroidism, unspecified: Secondary | ICD-10-CM | POA: Diagnosis not present

## 2022-12-31 DIAGNOSIS — N1330 Unspecified hydronephrosis: Secondary | ICD-10-CM | POA: Diagnosis not present

## 2022-12-31 DIAGNOSIS — M9701XD Periprosthetic fracture around internal prosthetic right hip joint, subsequent encounter: Secondary | ICD-10-CM | POA: Diagnosis not present

## 2023-01-01 DIAGNOSIS — N189 Chronic kidney disease, unspecified: Secondary | ICD-10-CM | POA: Diagnosis not present

## 2023-01-01 DIAGNOSIS — I129 Hypertensive chronic kidney disease with stage 1 through stage 4 chronic kidney disease, or unspecified chronic kidney disease: Secondary | ICD-10-CM | POA: Diagnosis not present

## 2023-01-01 DIAGNOSIS — R339 Retention of urine, unspecified: Secondary | ICD-10-CM | POA: Diagnosis not present

## 2023-01-01 DIAGNOSIS — M9701XD Periprosthetic fracture around internal prosthetic right hip joint, subsequent encounter: Secondary | ICD-10-CM | POA: Diagnosis not present

## 2023-01-01 DIAGNOSIS — N1330 Unspecified hydronephrosis: Secondary | ICD-10-CM | POA: Diagnosis not present

## 2023-01-01 DIAGNOSIS — E039 Hypothyroidism, unspecified: Secondary | ICD-10-CM | POA: Diagnosis not present

## 2023-01-02 DIAGNOSIS — E039 Hypothyroidism, unspecified: Secondary | ICD-10-CM | POA: Diagnosis not present

## 2023-01-02 DIAGNOSIS — M9701XD Periprosthetic fracture around internal prosthetic right hip joint, subsequent encounter: Secondary | ICD-10-CM | POA: Diagnosis not present

## 2023-01-02 DIAGNOSIS — N189 Chronic kidney disease, unspecified: Secondary | ICD-10-CM | POA: Diagnosis not present

## 2023-01-02 DIAGNOSIS — N1330 Unspecified hydronephrosis: Secondary | ICD-10-CM | POA: Diagnosis not present

## 2023-01-02 DIAGNOSIS — R339 Retention of urine, unspecified: Secondary | ICD-10-CM | POA: Diagnosis not present

## 2023-01-02 DIAGNOSIS — I129 Hypertensive chronic kidney disease with stage 1 through stage 4 chronic kidney disease, or unspecified chronic kidney disease: Secondary | ICD-10-CM | POA: Diagnosis not present

## 2023-01-03 DIAGNOSIS — S7291XA Unspecified fracture of right femur, initial encounter for closed fracture: Secondary | ICD-10-CM | POA: Diagnosis not present

## 2023-01-03 DIAGNOSIS — Z9181 History of falling: Secondary | ICD-10-CM | POA: Diagnosis not present

## 2023-01-03 DIAGNOSIS — N138 Other obstructive and reflux uropathy: Secondary | ICD-10-CM | POA: Diagnosis not present

## 2023-01-03 DIAGNOSIS — Z96651 Presence of right artificial knee joint: Secondary | ICD-10-CM | POA: Diagnosis not present

## 2023-01-03 DIAGNOSIS — R339 Retention of urine, unspecified: Secondary | ICD-10-CM | POA: Diagnosis not present

## 2023-01-03 DIAGNOSIS — N1330 Unspecified hydronephrosis: Secondary | ICD-10-CM | POA: Diagnosis not present

## 2023-01-03 DIAGNOSIS — I739 Peripheral vascular disease, unspecified: Secondary | ICD-10-CM | POA: Diagnosis not present

## 2023-01-03 DIAGNOSIS — I129 Hypertensive chronic kidney disease with stage 1 through stage 4 chronic kidney disease, or unspecified chronic kidney disease: Secondary | ICD-10-CM | POA: Diagnosis not present

## 2023-01-03 DIAGNOSIS — Z96642 Presence of left artificial hip joint: Secondary | ICD-10-CM | POA: Diagnosis not present

## 2023-01-03 DIAGNOSIS — Z86711 Personal history of pulmonary embolism: Secondary | ICD-10-CM | POA: Diagnosis not present

## 2023-01-03 DIAGNOSIS — M5416 Radiculopathy, lumbar region: Secondary | ICD-10-CM | POA: Diagnosis not present

## 2023-01-03 DIAGNOSIS — Z853 Personal history of malignant neoplasm of breast: Secondary | ICD-10-CM | POA: Diagnosis not present

## 2023-01-03 DIAGNOSIS — M9701XD Periprosthetic fracture around internal prosthetic right hip joint, subsequent encounter: Secondary | ICD-10-CM | POA: Diagnosis not present

## 2023-01-03 DIAGNOSIS — K219 Gastro-esophageal reflux disease without esophagitis: Secondary | ICD-10-CM | POA: Diagnosis not present

## 2023-01-03 DIAGNOSIS — M1990 Unspecified osteoarthritis, unspecified site: Secondary | ICD-10-CM | POA: Diagnosis not present

## 2023-01-03 DIAGNOSIS — Z8701 Personal history of pneumonia (recurrent): Secondary | ICD-10-CM | POA: Diagnosis not present

## 2023-01-03 DIAGNOSIS — E039 Hypothyroidism, unspecified: Secondary | ICD-10-CM | POA: Diagnosis not present

## 2023-01-03 DIAGNOSIS — N189 Chronic kidney disease, unspecified: Secondary | ICD-10-CM | POA: Diagnosis not present

## 2023-01-03 DIAGNOSIS — Z87440 Personal history of urinary (tract) infections: Secondary | ICD-10-CM | POA: Diagnosis not present

## 2023-01-05 DIAGNOSIS — R310 Gross hematuria: Secondary | ICD-10-CM | POA: Diagnosis not present

## 2023-01-06 DIAGNOSIS — I129 Hypertensive chronic kidney disease with stage 1 through stage 4 chronic kidney disease, or unspecified chronic kidney disease: Secondary | ICD-10-CM | POA: Diagnosis not present

## 2023-01-06 DIAGNOSIS — N189 Chronic kidney disease, unspecified: Secondary | ICD-10-CM | POA: Diagnosis not present

## 2023-01-06 DIAGNOSIS — E039 Hypothyroidism, unspecified: Secondary | ICD-10-CM | POA: Diagnosis not present

## 2023-01-06 DIAGNOSIS — M9701XD Periprosthetic fracture around internal prosthetic right hip joint, subsequent encounter: Secondary | ICD-10-CM | POA: Diagnosis not present

## 2023-01-06 DIAGNOSIS — N1330 Unspecified hydronephrosis: Secondary | ICD-10-CM | POA: Diagnosis not present

## 2023-01-06 DIAGNOSIS — R339 Retention of urine, unspecified: Secondary | ICD-10-CM | POA: Diagnosis not present

## 2023-01-08 DIAGNOSIS — N189 Chronic kidney disease, unspecified: Secondary | ICD-10-CM | POA: Diagnosis not present

## 2023-01-08 DIAGNOSIS — E039 Hypothyroidism, unspecified: Secondary | ICD-10-CM | POA: Diagnosis not present

## 2023-01-08 DIAGNOSIS — R339 Retention of urine, unspecified: Secondary | ICD-10-CM | POA: Diagnosis not present

## 2023-01-08 DIAGNOSIS — I129 Hypertensive chronic kidney disease with stage 1 through stage 4 chronic kidney disease, or unspecified chronic kidney disease: Secondary | ICD-10-CM | POA: Diagnosis not present

## 2023-01-08 DIAGNOSIS — M9701XD Periprosthetic fracture around internal prosthetic right hip joint, subsequent encounter: Secondary | ICD-10-CM | POA: Diagnosis not present

## 2023-01-08 DIAGNOSIS — N1330 Unspecified hydronephrosis: Secondary | ICD-10-CM | POA: Diagnosis not present

## 2023-01-13 DIAGNOSIS — N189 Chronic kidney disease, unspecified: Secondary | ICD-10-CM | POA: Diagnosis not present

## 2023-01-13 DIAGNOSIS — N1330 Unspecified hydronephrosis: Secondary | ICD-10-CM | POA: Diagnosis not present

## 2023-01-13 DIAGNOSIS — R339 Retention of urine, unspecified: Secondary | ICD-10-CM | POA: Diagnosis not present

## 2023-01-13 DIAGNOSIS — E039 Hypothyroidism, unspecified: Secondary | ICD-10-CM | POA: Diagnosis not present

## 2023-01-13 DIAGNOSIS — I129 Hypertensive chronic kidney disease with stage 1 through stage 4 chronic kidney disease, or unspecified chronic kidney disease: Secondary | ICD-10-CM | POA: Diagnosis not present

## 2023-01-13 DIAGNOSIS — M9701XD Periprosthetic fracture around internal prosthetic right hip joint, subsequent encounter: Secondary | ICD-10-CM | POA: Diagnosis not present

## 2023-01-14 ENCOUNTER — Other Ambulatory Visit (HOSPITAL_BASED_OUTPATIENT_CLINIC_OR_DEPARTMENT_OTHER): Payer: Self-pay

## 2023-01-14 DIAGNOSIS — Z23 Encounter for immunization: Secondary | ICD-10-CM | POA: Diagnosis not present

## 2023-01-14 MED ORDER — COMIRNATY 30 MCG/0.3ML IM SUSY
PREFILLED_SYRINGE | INTRAMUSCULAR | 0 refills | Status: AC
  Filled 2023-01-14: qty 0.3, 1d supply, fill #0

## 2023-01-16 DIAGNOSIS — N1330 Unspecified hydronephrosis: Secondary | ICD-10-CM | POA: Diagnosis not present

## 2023-01-16 DIAGNOSIS — E039 Hypothyroidism, unspecified: Secondary | ICD-10-CM | POA: Diagnosis not present

## 2023-01-16 DIAGNOSIS — R339 Retention of urine, unspecified: Secondary | ICD-10-CM | POA: Diagnosis not present

## 2023-01-16 DIAGNOSIS — I129 Hypertensive chronic kidney disease with stage 1 through stage 4 chronic kidney disease, or unspecified chronic kidney disease: Secondary | ICD-10-CM | POA: Diagnosis not present

## 2023-01-16 DIAGNOSIS — N189 Chronic kidney disease, unspecified: Secondary | ICD-10-CM | POA: Diagnosis not present

## 2023-01-16 DIAGNOSIS — M9701XD Periprosthetic fracture around internal prosthetic right hip joint, subsequent encounter: Secondary | ICD-10-CM | POA: Diagnosis not present

## 2023-01-20 DIAGNOSIS — E039 Hypothyroidism, unspecified: Secondary | ICD-10-CM | POA: Diagnosis not present

## 2023-01-20 DIAGNOSIS — N1330 Unspecified hydronephrosis: Secondary | ICD-10-CM | POA: Diagnosis not present

## 2023-01-20 DIAGNOSIS — R339 Retention of urine, unspecified: Secondary | ICD-10-CM | POA: Diagnosis not present

## 2023-01-20 DIAGNOSIS — I129 Hypertensive chronic kidney disease with stage 1 through stage 4 chronic kidney disease, or unspecified chronic kidney disease: Secondary | ICD-10-CM | POA: Diagnosis not present

## 2023-01-20 DIAGNOSIS — N189 Chronic kidney disease, unspecified: Secondary | ICD-10-CM | POA: Diagnosis not present

## 2023-01-20 DIAGNOSIS — M9701XD Periprosthetic fracture around internal prosthetic right hip joint, subsequent encounter: Secondary | ICD-10-CM | POA: Diagnosis not present

## 2023-01-22 DIAGNOSIS — N189 Chronic kidney disease, unspecified: Secondary | ICD-10-CM | POA: Diagnosis not present

## 2023-01-22 DIAGNOSIS — N1330 Unspecified hydronephrosis: Secondary | ICD-10-CM | POA: Diagnosis not present

## 2023-01-22 DIAGNOSIS — R339 Retention of urine, unspecified: Secondary | ICD-10-CM | POA: Diagnosis not present

## 2023-01-22 DIAGNOSIS — E039 Hypothyroidism, unspecified: Secondary | ICD-10-CM | POA: Diagnosis not present

## 2023-01-22 DIAGNOSIS — I129 Hypertensive chronic kidney disease with stage 1 through stage 4 chronic kidney disease, or unspecified chronic kidney disease: Secondary | ICD-10-CM | POA: Diagnosis not present

## 2023-01-22 DIAGNOSIS — M9701XD Periprosthetic fracture around internal prosthetic right hip joint, subsequent encounter: Secondary | ICD-10-CM | POA: Diagnosis not present

## 2023-01-23 ENCOUNTER — Other Ambulatory Visit (HOSPITAL_BASED_OUTPATIENT_CLINIC_OR_DEPARTMENT_OTHER): Payer: Self-pay

## 2023-01-23 DIAGNOSIS — Z471 Aftercare following joint replacement surgery: Secondary | ICD-10-CM | POA: Diagnosis not present

## 2023-01-23 DIAGNOSIS — Z23 Encounter for immunization: Secondary | ICD-10-CM | POA: Diagnosis not present

## 2023-01-23 MED ORDER — INFLUENZA VAC A&B SURF ANT ADJ 0.5 ML IM SUSY
0.5000 mL | PREFILLED_SYRINGE | Freq: Once | INTRAMUSCULAR | 0 refills | Status: AC
  Filled 2023-01-23: qty 0.5, 1d supply, fill #0

## 2023-01-27 DIAGNOSIS — N1330 Unspecified hydronephrosis: Secondary | ICD-10-CM | POA: Diagnosis not present

## 2023-01-27 DIAGNOSIS — E039 Hypothyroidism, unspecified: Secondary | ICD-10-CM | POA: Diagnosis not present

## 2023-01-27 DIAGNOSIS — I129 Hypertensive chronic kidney disease with stage 1 through stage 4 chronic kidney disease, or unspecified chronic kidney disease: Secondary | ICD-10-CM | POA: Diagnosis not present

## 2023-01-27 DIAGNOSIS — M9701XD Periprosthetic fracture around internal prosthetic right hip joint, subsequent encounter: Secondary | ICD-10-CM | POA: Diagnosis not present

## 2023-01-27 DIAGNOSIS — N189 Chronic kidney disease, unspecified: Secondary | ICD-10-CM | POA: Diagnosis not present

## 2023-01-27 DIAGNOSIS — R339 Retention of urine, unspecified: Secondary | ICD-10-CM | POA: Diagnosis not present

## 2023-01-28 DIAGNOSIS — E039 Hypothyroidism, unspecified: Secondary | ICD-10-CM | POA: Diagnosis not present

## 2023-01-28 DIAGNOSIS — M9701XD Periprosthetic fracture around internal prosthetic right hip joint, subsequent encounter: Secondary | ICD-10-CM | POA: Diagnosis not present

## 2023-01-28 DIAGNOSIS — N189 Chronic kidney disease, unspecified: Secondary | ICD-10-CM | POA: Diagnosis not present

## 2023-01-28 DIAGNOSIS — N1330 Unspecified hydronephrosis: Secondary | ICD-10-CM | POA: Diagnosis not present

## 2023-01-28 DIAGNOSIS — I129 Hypertensive chronic kidney disease with stage 1 through stage 4 chronic kidney disease, or unspecified chronic kidney disease: Secondary | ICD-10-CM | POA: Diagnosis not present

## 2023-01-28 DIAGNOSIS — R339 Retention of urine, unspecified: Secondary | ICD-10-CM | POA: Diagnosis not present

## 2023-02-02 DIAGNOSIS — Z8701 Personal history of pneumonia (recurrent): Secondary | ICD-10-CM | POA: Diagnosis not present

## 2023-02-02 DIAGNOSIS — M5416 Radiculopathy, lumbar region: Secondary | ICD-10-CM | POA: Diagnosis not present

## 2023-02-02 DIAGNOSIS — E039 Hypothyroidism, unspecified: Secondary | ICD-10-CM | POA: Diagnosis not present

## 2023-02-02 DIAGNOSIS — Z86711 Personal history of pulmonary embolism: Secondary | ICD-10-CM | POA: Diagnosis not present

## 2023-02-02 DIAGNOSIS — Z87440 Personal history of urinary (tract) infections: Secondary | ICD-10-CM | POA: Diagnosis not present

## 2023-02-02 DIAGNOSIS — Z96642 Presence of left artificial hip joint: Secondary | ICD-10-CM | POA: Diagnosis not present

## 2023-02-02 DIAGNOSIS — K219 Gastro-esophageal reflux disease without esophagitis: Secondary | ICD-10-CM | POA: Diagnosis not present

## 2023-02-02 DIAGNOSIS — M1990 Unspecified osteoarthritis, unspecified site: Secondary | ICD-10-CM | POA: Diagnosis not present

## 2023-02-02 DIAGNOSIS — I129 Hypertensive chronic kidney disease with stage 1 through stage 4 chronic kidney disease, or unspecified chronic kidney disease: Secondary | ICD-10-CM | POA: Diagnosis not present

## 2023-02-02 DIAGNOSIS — N189 Chronic kidney disease, unspecified: Secondary | ICD-10-CM | POA: Diagnosis not present

## 2023-02-02 DIAGNOSIS — N138 Other obstructive and reflux uropathy: Secondary | ICD-10-CM | POA: Diagnosis not present

## 2023-02-02 DIAGNOSIS — M9701XD Periprosthetic fracture around internal prosthetic right hip joint, subsequent encounter: Secondary | ICD-10-CM | POA: Diagnosis not present

## 2023-02-02 DIAGNOSIS — S7291XA Unspecified fracture of right femur, initial encounter for closed fracture: Secondary | ICD-10-CM | POA: Diagnosis not present

## 2023-02-02 DIAGNOSIS — Z96651 Presence of right artificial knee joint: Secondary | ICD-10-CM | POA: Diagnosis not present

## 2023-02-02 DIAGNOSIS — Z9181 History of falling: Secondary | ICD-10-CM | POA: Diagnosis not present

## 2023-02-02 DIAGNOSIS — N1330 Unspecified hydronephrosis: Secondary | ICD-10-CM | POA: Diagnosis not present

## 2023-02-02 DIAGNOSIS — R339 Retention of urine, unspecified: Secondary | ICD-10-CM | POA: Diagnosis not present

## 2023-02-02 DIAGNOSIS — Z853 Personal history of malignant neoplasm of breast: Secondary | ICD-10-CM | POA: Diagnosis not present

## 2023-02-02 DIAGNOSIS — I739 Peripheral vascular disease, unspecified: Secondary | ICD-10-CM | POA: Diagnosis not present

## 2023-02-05 DIAGNOSIS — M9701XD Periprosthetic fracture around internal prosthetic right hip joint, subsequent encounter: Secondary | ICD-10-CM | POA: Diagnosis not present

## 2023-02-05 DIAGNOSIS — N189 Chronic kidney disease, unspecified: Secondary | ICD-10-CM | POA: Diagnosis not present

## 2023-02-05 DIAGNOSIS — R339 Retention of urine, unspecified: Secondary | ICD-10-CM | POA: Diagnosis not present

## 2023-02-05 DIAGNOSIS — I129 Hypertensive chronic kidney disease with stage 1 through stage 4 chronic kidney disease, or unspecified chronic kidney disease: Secondary | ICD-10-CM | POA: Diagnosis not present

## 2023-02-05 DIAGNOSIS — N1330 Unspecified hydronephrosis: Secondary | ICD-10-CM | POA: Diagnosis not present

## 2023-02-05 DIAGNOSIS — E039 Hypothyroidism, unspecified: Secondary | ICD-10-CM | POA: Diagnosis not present

## 2023-02-06 DIAGNOSIS — I129 Hypertensive chronic kidney disease with stage 1 through stage 4 chronic kidney disease, or unspecified chronic kidney disease: Secondary | ICD-10-CM | POA: Diagnosis not present

## 2023-02-06 DIAGNOSIS — M9701XD Periprosthetic fracture around internal prosthetic right hip joint, subsequent encounter: Secondary | ICD-10-CM | POA: Diagnosis not present

## 2023-02-06 DIAGNOSIS — N189 Chronic kidney disease, unspecified: Secondary | ICD-10-CM | POA: Diagnosis not present

## 2023-02-06 DIAGNOSIS — R339 Retention of urine, unspecified: Secondary | ICD-10-CM | POA: Diagnosis not present

## 2023-02-06 DIAGNOSIS — N1330 Unspecified hydronephrosis: Secondary | ICD-10-CM | POA: Diagnosis not present

## 2023-02-06 DIAGNOSIS — E039 Hypothyroidism, unspecified: Secondary | ICD-10-CM | POA: Diagnosis not present

## 2023-02-09 DIAGNOSIS — I129 Hypertensive chronic kidney disease with stage 1 through stage 4 chronic kidney disease, or unspecified chronic kidney disease: Secondary | ICD-10-CM | POA: Diagnosis not present

## 2023-02-09 DIAGNOSIS — N1330 Unspecified hydronephrosis: Secondary | ICD-10-CM | POA: Diagnosis not present

## 2023-02-09 DIAGNOSIS — E039 Hypothyroidism, unspecified: Secondary | ICD-10-CM | POA: Diagnosis not present

## 2023-02-09 DIAGNOSIS — R339 Retention of urine, unspecified: Secondary | ICD-10-CM | POA: Diagnosis not present

## 2023-02-09 DIAGNOSIS — M9701XD Periprosthetic fracture around internal prosthetic right hip joint, subsequent encounter: Secondary | ICD-10-CM | POA: Diagnosis not present

## 2023-02-09 DIAGNOSIS — N189 Chronic kidney disease, unspecified: Secondary | ICD-10-CM | POA: Diagnosis not present

## 2023-02-11 DIAGNOSIS — E039 Hypothyroidism, unspecified: Secondary | ICD-10-CM | POA: Diagnosis not present

## 2023-02-11 DIAGNOSIS — N1330 Unspecified hydronephrosis: Secondary | ICD-10-CM | POA: Diagnosis not present

## 2023-02-11 DIAGNOSIS — M9701XD Periprosthetic fracture around internal prosthetic right hip joint, subsequent encounter: Secondary | ICD-10-CM | POA: Diagnosis not present

## 2023-02-11 DIAGNOSIS — R339 Retention of urine, unspecified: Secondary | ICD-10-CM | POA: Diagnosis not present

## 2023-02-11 DIAGNOSIS — I129 Hypertensive chronic kidney disease with stage 1 through stage 4 chronic kidney disease, or unspecified chronic kidney disease: Secondary | ICD-10-CM | POA: Diagnosis not present

## 2023-02-11 DIAGNOSIS — N189 Chronic kidney disease, unspecified: Secondary | ICD-10-CM | POA: Diagnosis not present

## 2023-02-13 DIAGNOSIS — M25661 Stiffness of right knee, not elsewhere classified: Secondary | ICD-10-CM | POA: Diagnosis not present

## 2023-02-13 DIAGNOSIS — M6281 Muscle weakness (generalized): Secondary | ICD-10-CM | POA: Diagnosis not present

## 2023-02-13 DIAGNOSIS — R2689 Other abnormalities of gait and mobility: Secondary | ICD-10-CM | POA: Diagnosis not present

## 2023-02-17 DIAGNOSIS — M6281 Muscle weakness (generalized): Secondary | ICD-10-CM | POA: Diagnosis not present

## 2023-02-17 DIAGNOSIS — R2689 Other abnormalities of gait and mobility: Secondary | ICD-10-CM | POA: Diagnosis not present

## 2023-02-17 DIAGNOSIS — M25661 Stiffness of right knee, not elsewhere classified: Secondary | ICD-10-CM | POA: Diagnosis not present

## 2023-02-19 DIAGNOSIS — M6281 Muscle weakness (generalized): Secondary | ICD-10-CM | POA: Diagnosis not present

## 2023-02-19 DIAGNOSIS — M25661 Stiffness of right knee, not elsewhere classified: Secondary | ICD-10-CM | POA: Diagnosis not present

## 2023-02-19 DIAGNOSIS — R2689 Other abnormalities of gait and mobility: Secondary | ICD-10-CM | POA: Diagnosis not present

## 2023-02-24 DIAGNOSIS — R2689 Other abnormalities of gait and mobility: Secondary | ICD-10-CM | POA: Diagnosis not present

## 2023-02-24 DIAGNOSIS — M6281 Muscle weakness (generalized): Secondary | ICD-10-CM | POA: Diagnosis not present

## 2023-02-24 DIAGNOSIS — M25661 Stiffness of right knee, not elsewhere classified: Secondary | ICD-10-CM | POA: Diagnosis not present

## 2023-02-26 DIAGNOSIS — M6281 Muscle weakness (generalized): Secondary | ICD-10-CM | POA: Diagnosis not present

## 2023-02-26 DIAGNOSIS — R2689 Other abnormalities of gait and mobility: Secondary | ICD-10-CM | POA: Diagnosis not present

## 2023-02-26 DIAGNOSIS — M25661 Stiffness of right knee, not elsewhere classified: Secondary | ICD-10-CM | POA: Diagnosis not present

## 2023-03-04 DIAGNOSIS — M6281 Muscle weakness (generalized): Secondary | ICD-10-CM | POA: Diagnosis not present

## 2023-03-04 DIAGNOSIS — R2689 Other abnormalities of gait and mobility: Secondary | ICD-10-CM | POA: Diagnosis not present

## 2023-03-04 DIAGNOSIS — M25661 Stiffness of right knee, not elsewhere classified: Secondary | ICD-10-CM | POA: Diagnosis not present

## 2023-03-05 DIAGNOSIS — R2689 Other abnormalities of gait and mobility: Secondary | ICD-10-CM | POA: Diagnosis not present

## 2023-03-05 DIAGNOSIS — M6281 Muscle weakness (generalized): Secondary | ICD-10-CM | POA: Diagnosis not present

## 2023-03-05 DIAGNOSIS — M25661 Stiffness of right knee, not elsewhere classified: Secondary | ICD-10-CM | POA: Diagnosis not present

## 2023-03-10 DIAGNOSIS — M25661 Stiffness of right knee, not elsewhere classified: Secondary | ICD-10-CM | POA: Diagnosis not present

## 2023-03-10 DIAGNOSIS — R2689 Other abnormalities of gait and mobility: Secondary | ICD-10-CM | POA: Diagnosis not present

## 2023-03-10 DIAGNOSIS — M6281 Muscle weakness (generalized): Secondary | ICD-10-CM | POA: Diagnosis not present

## 2023-03-13 DIAGNOSIS — M6281 Muscle weakness (generalized): Secondary | ICD-10-CM | POA: Diagnosis not present

## 2023-03-13 DIAGNOSIS — M25661 Stiffness of right knee, not elsewhere classified: Secondary | ICD-10-CM | POA: Diagnosis not present

## 2023-03-13 DIAGNOSIS — R2689 Other abnormalities of gait and mobility: Secondary | ICD-10-CM | POA: Diagnosis not present

## 2023-03-16 DIAGNOSIS — M25661 Stiffness of right knee, not elsewhere classified: Secondary | ICD-10-CM | POA: Diagnosis not present

## 2023-03-16 DIAGNOSIS — M6281 Muscle weakness (generalized): Secondary | ICD-10-CM | POA: Diagnosis not present

## 2023-03-16 DIAGNOSIS — R2689 Other abnormalities of gait and mobility: Secondary | ICD-10-CM | POA: Diagnosis not present

## 2023-03-24 DIAGNOSIS — M25661 Stiffness of right knee, not elsewhere classified: Secondary | ICD-10-CM | POA: Diagnosis not present

## 2023-03-24 DIAGNOSIS — M6281 Muscle weakness (generalized): Secondary | ICD-10-CM | POA: Diagnosis not present

## 2023-03-24 DIAGNOSIS — R2689 Other abnormalities of gait and mobility: Secondary | ICD-10-CM | POA: Diagnosis not present

## 2023-03-26 DIAGNOSIS — R2689 Other abnormalities of gait and mobility: Secondary | ICD-10-CM | POA: Diagnosis not present

## 2023-03-26 DIAGNOSIS — M25661 Stiffness of right knee, not elsewhere classified: Secondary | ICD-10-CM | POA: Diagnosis not present

## 2023-03-26 DIAGNOSIS — M6281 Muscle weakness (generalized): Secondary | ICD-10-CM | POA: Diagnosis not present

## 2023-03-30 DIAGNOSIS — M25661 Stiffness of right knee, not elsewhere classified: Secondary | ICD-10-CM | POA: Diagnosis not present

## 2023-03-30 DIAGNOSIS — M6281 Muscle weakness (generalized): Secondary | ICD-10-CM | POA: Diagnosis not present

## 2023-03-30 DIAGNOSIS — R2689 Other abnormalities of gait and mobility: Secondary | ICD-10-CM | POA: Diagnosis not present

## 2023-04-02 DIAGNOSIS — M25661 Stiffness of right knee, not elsewhere classified: Secondary | ICD-10-CM | POA: Diagnosis not present

## 2023-04-02 DIAGNOSIS — M6281 Muscle weakness (generalized): Secondary | ICD-10-CM | POA: Diagnosis not present

## 2023-04-02 DIAGNOSIS — R2689 Other abnormalities of gait and mobility: Secondary | ICD-10-CM | POA: Diagnosis not present

## 2023-04-07 DIAGNOSIS — R2689 Other abnormalities of gait and mobility: Secondary | ICD-10-CM | POA: Diagnosis not present

## 2023-04-07 DIAGNOSIS — M25661 Stiffness of right knee, not elsewhere classified: Secondary | ICD-10-CM | POA: Diagnosis not present

## 2023-04-07 DIAGNOSIS — M6281 Muscle weakness (generalized): Secondary | ICD-10-CM | POA: Diagnosis not present

## 2023-04-10 DIAGNOSIS — M25661 Stiffness of right knee, not elsewhere classified: Secondary | ICD-10-CM | POA: Diagnosis not present

## 2023-04-10 DIAGNOSIS — M6281 Muscle weakness (generalized): Secondary | ICD-10-CM | POA: Diagnosis not present

## 2023-04-10 DIAGNOSIS — R2689 Other abnormalities of gait and mobility: Secondary | ICD-10-CM | POA: Diagnosis not present

## 2023-04-13 DIAGNOSIS — M25661 Stiffness of right knee, not elsewhere classified: Secondary | ICD-10-CM | POA: Diagnosis not present

## 2023-04-13 DIAGNOSIS — M6281 Muscle weakness (generalized): Secondary | ICD-10-CM | POA: Diagnosis not present

## 2023-04-13 DIAGNOSIS — R2689 Other abnormalities of gait and mobility: Secondary | ICD-10-CM | POA: Diagnosis not present

## 2023-04-17 DIAGNOSIS — M6281 Muscle weakness (generalized): Secondary | ICD-10-CM | POA: Diagnosis not present

## 2023-04-17 DIAGNOSIS — M25661 Stiffness of right knee, not elsewhere classified: Secondary | ICD-10-CM | POA: Diagnosis not present

## 2023-04-17 DIAGNOSIS — R2689 Other abnormalities of gait and mobility: Secondary | ICD-10-CM | POA: Diagnosis not present

## 2023-04-20 DIAGNOSIS — R2689 Other abnormalities of gait and mobility: Secondary | ICD-10-CM | POA: Diagnosis not present

## 2023-04-20 DIAGNOSIS — M6281 Muscle weakness (generalized): Secondary | ICD-10-CM | POA: Diagnosis not present

## 2023-04-20 DIAGNOSIS — M25661 Stiffness of right knee, not elsewhere classified: Secondary | ICD-10-CM | POA: Diagnosis not present

## 2023-04-23 DIAGNOSIS — R2689 Other abnormalities of gait and mobility: Secondary | ICD-10-CM | POA: Diagnosis not present

## 2023-04-23 DIAGNOSIS — M6281 Muscle weakness (generalized): Secondary | ICD-10-CM | POA: Diagnosis not present

## 2023-04-23 DIAGNOSIS — M25661 Stiffness of right knee, not elsewhere classified: Secondary | ICD-10-CM | POA: Diagnosis not present

## 2023-04-24 DIAGNOSIS — Z471 Aftercare following joint replacement surgery: Secondary | ICD-10-CM | POA: Diagnosis not present

## 2023-04-28 DIAGNOSIS — M6281 Muscle weakness (generalized): Secondary | ICD-10-CM | POA: Diagnosis not present

## 2023-04-28 DIAGNOSIS — R2689 Other abnormalities of gait and mobility: Secondary | ICD-10-CM | POA: Diagnosis not present

## 2023-04-28 DIAGNOSIS — M25661 Stiffness of right knee, not elsewhere classified: Secondary | ICD-10-CM | POA: Diagnosis not present

## 2023-04-29 ENCOUNTER — Other Ambulatory Visit: Payer: Self-pay | Admitting: Family Medicine

## 2023-04-29 DIAGNOSIS — E039 Hypothyroidism, unspecified: Secondary | ICD-10-CM

## 2023-04-30 DIAGNOSIS — R2689 Other abnormalities of gait and mobility: Secondary | ICD-10-CM | POA: Diagnosis not present

## 2023-04-30 DIAGNOSIS — M25661 Stiffness of right knee, not elsewhere classified: Secondary | ICD-10-CM | POA: Diagnosis not present

## 2023-04-30 DIAGNOSIS — M6281 Muscle weakness (generalized): Secondary | ICD-10-CM | POA: Diagnosis not present

## 2023-05-04 DIAGNOSIS — M25661 Stiffness of right knee, not elsewhere classified: Secondary | ICD-10-CM | POA: Diagnosis not present

## 2023-05-04 DIAGNOSIS — M6281 Muscle weakness (generalized): Secondary | ICD-10-CM | POA: Diagnosis not present

## 2023-05-04 DIAGNOSIS — R2689 Other abnormalities of gait and mobility: Secondary | ICD-10-CM | POA: Diagnosis not present

## 2023-05-08 DIAGNOSIS — M6281 Muscle weakness (generalized): Secondary | ICD-10-CM | POA: Diagnosis not present

## 2023-05-08 DIAGNOSIS — M25661 Stiffness of right knee, not elsewhere classified: Secondary | ICD-10-CM | POA: Diagnosis not present

## 2023-05-08 DIAGNOSIS — R2689 Other abnormalities of gait and mobility: Secondary | ICD-10-CM | POA: Diagnosis not present

## 2023-05-12 DIAGNOSIS — M6281 Muscle weakness (generalized): Secondary | ICD-10-CM | POA: Diagnosis not present

## 2023-05-12 DIAGNOSIS — R2689 Other abnormalities of gait and mobility: Secondary | ICD-10-CM | POA: Diagnosis not present

## 2023-05-12 DIAGNOSIS — M25661 Stiffness of right knee, not elsewhere classified: Secondary | ICD-10-CM | POA: Diagnosis not present

## 2023-05-15 DIAGNOSIS — R2689 Other abnormalities of gait and mobility: Secondary | ICD-10-CM | POA: Diagnosis not present

## 2023-05-15 DIAGNOSIS — M6281 Muscle weakness (generalized): Secondary | ICD-10-CM | POA: Diagnosis not present

## 2023-05-15 DIAGNOSIS — M25551 Pain in right hip: Secondary | ICD-10-CM | POA: Diagnosis not present

## 2023-05-18 DIAGNOSIS — R2689 Other abnormalities of gait and mobility: Secondary | ICD-10-CM | POA: Diagnosis not present

## 2023-05-18 DIAGNOSIS — M6281 Muscle weakness (generalized): Secondary | ICD-10-CM | POA: Diagnosis not present

## 2023-05-18 DIAGNOSIS — M25551 Pain in right hip: Secondary | ICD-10-CM | POA: Diagnosis not present

## 2023-05-22 DIAGNOSIS — R2689 Other abnormalities of gait and mobility: Secondary | ICD-10-CM | POA: Diagnosis not present

## 2023-05-22 DIAGNOSIS — M6281 Muscle weakness (generalized): Secondary | ICD-10-CM | POA: Diagnosis not present

## 2023-05-22 DIAGNOSIS — M25551 Pain in right hip: Secondary | ICD-10-CM | POA: Diagnosis not present

## 2023-05-25 DIAGNOSIS — R2689 Other abnormalities of gait and mobility: Secondary | ICD-10-CM | POA: Diagnosis not present

## 2023-05-25 DIAGNOSIS — M25551 Pain in right hip: Secondary | ICD-10-CM | POA: Diagnosis not present

## 2023-05-25 DIAGNOSIS — M6281 Muscle weakness (generalized): Secondary | ICD-10-CM | POA: Diagnosis not present

## 2023-05-27 DIAGNOSIS — M25551 Pain in right hip: Secondary | ICD-10-CM | POA: Diagnosis not present

## 2023-05-27 DIAGNOSIS — R2689 Other abnormalities of gait and mobility: Secondary | ICD-10-CM | POA: Diagnosis not present

## 2023-05-27 DIAGNOSIS — M25661 Stiffness of right knee, not elsewhere classified: Secondary | ICD-10-CM | POA: Diagnosis not present

## 2023-05-27 DIAGNOSIS — M6281 Muscle weakness (generalized): Secondary | ICD-10-CM | POA: Diagnosis not present

## 2023-06-01 DIAGNOSIS — M25661 Stiffness of right knee, not elsewhere classified: Secondary | ICD-10-CM | POA: Diagnosis not present

## 2023-06-01 DIAGNOSIS — R2689 Other abnormalities of gait and mobility: Secondary | ICD-10-CM | POA: Diagnosis not present

## 2023-06-01 DIAGNOSIS — M25551 Pain in right hip: Secondary | ICD-10-CM | POA: Diagnosis not present

## 2023-06-01 DIAGNOSIS — M6281 Muscle weakness (generalized): Secondary | ICD-10-CM | POA: Diagnosis not present

## 2023-06-05 DIAGNOSIS — M6281 Muscle weakness (generalized): Secondary | ICD-10-CM | POA: Diagnosis not present

## 2023-06-05 DIAGNOSIS — M25661 Stiffness of right knee, not elsewhere classified: Secondary | ICD-10-CM | POA: Diagnosis not present

## 2023-06-05 DIAGNOSIS — R2689 Other abnormalities of gait and mobility: Secondary | ICD-10-CM | POA: Diagnosis not present

## 2023-06-05 DIAGNOSIS — M25551 Pain in right hip: Secondary | ICD-10-CM | POA: Diagnosis not present

## 2023-06-08 DIAGNOSIS — M25551 Pain in right hip: Secondary | ICD-10-CM | POA: Diagnosis not present

## 2023-06-08 DIAGNOSIS — R2689 Other abnormalities of gait and mobility: Secondary | ICD-10-CM | POA: Diagnosis not present

## 2023-06-08 DIAGNOSIS — M6281 Muscle weakness (generalized): Secondary | ICD-10-CM | POA: Diagnosis not present

## 2023-06-08 DIAGNOSIS — M25661 Stiffness of right knee, not elsewhere classified: Secondary | ICD-10-CM | POA: Diagnosis not present

## 2023-06-12 DIAGNOSIS — M25661 Stiffness of right knee, not elsewhere classified: Secondary | ICD-10-CM | POA: Diagnosis not present

## 2023-06-12 DIAGNOSIS — R2689 Other abnormalities of gait and mobility: Secondary | ICD-10-CM | POA: Diagnosis not present

## 2023-06-12 DIAGNOSIS — M6281 Muscle weakness (generalized): Secondary | ICD-10-CM | POA: Diagnosis not present

## 2023-06-12 DIAGNOSIS — M25551 Pain in right hip: Secondary | ICD-10-CM | POA: Diagnosis not present

## 2023-06-15 DIAGNOSIS — R2689 Other abnormalities of gait and mobility: Secondary | ICD-10-CM | POA: Diagnosis not present

## 2023-06-15 DIAGNOSIS — M25661 Stiffness of right knee, not elsewhere classified: Secondary | ICD-10-CM | POA: Diagnosis not present

## 2023-06-15 DIAGNOSIS — M25551 Pain in right hip: Secondary | ICD-10-CM | POA: Diagnosis not present

## 2023-06-15 DIAGNOSIS — M6281 Muscle weakness (generalized): Secondary | ICD-10-CM | POA: Diagnosis not present

## 2023-06-19 DIAGNOSIS — R2689 Other abnormalities of gait and mobility: Secondary | ICD-10-CM | POA: Diagnosis not present

## 2023-06-19 DIAGNOSIS — M25551 Pain in right hip: Secondary | ICD-10-CM | POA: Diagnosis not present

## 2023-06-22 DIAGNOSIS — M25551 Pain in right hip: Secondary | ICD-10-CM | POA: Diagnosis not present

## 2023-06-22 DIAGNOSIS — R2689 Other abnormalities of gait and mobility: Secondary | ICD-10-CM | POA: Diagnosis not present

## 2023-06-24 DIAGNOSIS — H43813 Vitreous degeneration, bilateral: Secondary | ICD-10-CM | POA: Diagnosis not present

## 2023-06-24 DIAGNOSIS — Z961 Presence of intraocular lens: Secondary | ICD-10-CM | POA: Diagnosis not present

## 2023-06-26 DIAGNOSIS — R2689 Other abnormalities of gait and mobility: Secondary | ICD-10-CM | POA: Diagnosis not present

## 2023-06-26 DIAGNOSIS — M25551 Pain in right hip: Secondary | ICD-10-CM | POA: Diagnosis not present

## 2023-08-02 ENCOUNTER — Other Ambulatory Visit: Payer: Self-pay | Admitting: Family Medicine

## 2023-08-02 DIAGNOSIS — E039 Hypothyroidism, unspecified: Secondary | ICD-10-CM

## 2023-08-03 ENCOUNTER — Other Ambulatory Visit: Payer: Self-pay | Admitting: Family Medicine

## 2023-08-03 DIAGNOSIS — Z1231 Encounter for screening mammogram for malignant neoplasm of breast: Secondary | ICD-10-CM

## 2023-08-12 ENCOUNTER — Ambulatory Visit (INDEPENDENT_AMBULATORY_CARE_PROVIDER_SITE_OTHER): Admitting: Family Medicine

## 2023-08-12 ENCOUNTER — Encounter: Payer: Self-pay | Admitting: Family Medicine

## 2023-08-12 VITALS — BP 130/64 | HR 81 | Temp 97.9°F | Ht 60.24 in | Wt 192.7 lb

## 2023-08-12 DIAGNOSIS — R7402 Elevation of levels of lactic acid dehydrogenase (LDH): Secondary | ICD-10-CM

## 2023-08-12 DIAGNOSIS — R7302 Impaired glucose tolerance (oral): Secondary | ICD-10-CM

## 2023-08-12 DIAGNOSIS — N1831 Chronic kidney disease, stage 3a: Secondary | ICD-10-CM | POA: Diagnosis not present

## 2023-08-12 DIAGNOSIS — D508 Other iron deficiency anemias: Secondary | ICD-10-CM | POA: Diagnosis not present

## 2023-08-12 DIAGNOSIS — E039 Hypothyroidism, unspecified: Secondary | ICD-10-CM

## 2023-08-12 DIAGNOSIS — I10 Essential (primary) hypertension: Secondary | ICD-10-CM | POA: Diagnosis not present

## 2023-08-12 LAB — COMPREHENSIVE METABOLIC PANEL WITH GFR
ALT: 9 U/L (ref 0–35)
AST: 18 U/L (ref 0–37)
Albumin: 3.7 g/dL (ref 3.5–5.2)
Alkaline Phosphatase: 90 U/L (ref 39–117)
BUN: 31 mg/dL — ABNORMAL HIGH (ref 6–23)
CO2: 25 meq/L (ref 19–32)
Calcium: 9 mg/dL (ref 8.4–10.5)
Chloride: 105 meq/L (ref 96–112)
Creatinine, Ser: 1.22 mg/dL — ABNORMAL HIGH (ref 0.40–1.20)
GFR: 42.97 mL/min — ABNORMAL LOW (ref >60.00)
Glucose, Bld: 113 mg/dL — ABNORMAL HIGH (ref 70–99)
Potassium: 4.2 meq/L (ref 3.5–5.1)
Sodium: 138 meq/L (ref 135–145)
Total Bilirubin: 0.4 mg/dL (ref 0.2–1.2)
Total Protein: 7.5 g/dL (ref 6.0–8.3)

## 2023-08-12 LAB — CBC WITH DIFFERENTIAL/PLATELET
Basophils Absolute: 0.1 10*3/uL (ref 0.0–0.1)
Basophils Relative: 0.8 % (ref 0.0–3.0)
Eosinophils Absolute: 0.3 10*3/uL (ref 0.0–0.7)
Eosinophils Relative: 2.8 % (ref 0.0–5.0)
HCT: 35 % — ABNORMAL LOW (ref 36.0–46.0)
Hemoglobin: 11.4 g/dL — ABNORMAL LOW (ref 12.0–15.0)
Lymphocytes Relative: 19.6 % (ref 12.0–46.0)
Lymphs Abs: 2 10*3/uL (ref 0.7–4.0)
MCHC: 32.6 g/dL (ref 30.0–36.0)
MCV: 78.5 fl (ref 78.0–100.0)
Monocytes Absolute: 0.7 10*3/uL (ref 0.1–1.0)
Monocytes Relative: 6.5 % (ref 3.0–12.0)
Neutro Abs: 7.3 10*3/uL (ref 1.4–7.7)
Neutrophils Relative %: 70.3 % (ref 43.0–77.0)
Platelets: 262 10*3/uL (ref 150.0–400.0)
RBC: 4.46 Mil/uL (ref 3.87–5.11)
RDW: 17.4 % — ABNORMAL HIGH (ref 11.5–15.5)
WBC: 10.3 10*3/uL (ref 4.0–10.5)

## 2023-08-12 LAB — TSH: TSH: 3.13 u[IU]/mL (ref 0.35–5.50)

## 2023-08-12 LAB — HEMOGLOBIN A1C: Hgb A1c MFr Bld: 5.9 % (ref 4.6–6.5)

## 2023-08-12 NOTE — Progress Notes (Signed)
 Established Patient Office Visit  Subjective   Patient ID: Kristen Escobar, female    DOB: 1946/03/29  Age: 78 y.o. MRN: [id number]  Chief Complaint  Patient presents with   Annual Exam    HPI   Kristen Escobar is seen for yearly check.  Still recovering from complex pelvic fracture which made it impossible for her to ambulate for months.  She has had some exertional dyspnea which she thinks is deconditioning.  No chest pains.  Echocardiogram 3/23 no reduction in EF.  No major valve issues.  She has done some water  exercises and does not see the dyspnea with exercise and water  like she does out of water .  She has other medical problems including history of Buerger's disease, hypertension, GERD, hypothyroidism, history of impaired glucose tolerance, history of breast cancer, osteoarthritis involving multiple joints, history of diffuse large B-cell lymphoma.  Has history of elevated LDH and requesting repeat labs today.  She needs other follow-up labs as well.  Medications reviewed include omeprazole  20 mg daily, levothyroxine  75 mcg daily, amlodipine  5 mg daily  She hopes to lose some weight and get back to daily exercise soon.  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   Diffuse large B cell lymphoma (HCC) dx'd 02/17/17   in remission 2 years now   had chemo    GERD (gastroesophageal reflux disease)    Heart murmur    07/03/21 echo: LVEF 60-65%, grade I DD, normal PASP, trivial MR, mild AI   History of radiation therapy 07/12/10,completed   right breast 60 Gy x30 fx   Hypertension    no meds currently   Hypothyroidism    Lymphoma (HCC)    Obesity    PE (pulmonary thromboembolism) (HCC) 01/2017   in setting of chemotherapy for lymphoma   Peripheral vascular  disease (HCC) 1995   PT DEVELOPED CIRCULATION PROBLEMS IN BOTH HANDS AND GANGRENE OF BOTH INDEX FINGERS--REQUIRING AMPUTATION OF THE INDEX FINGERS AND VASCULAR SURGERY.  PT TOLD HER PROBLEMS RELATED TO SMOKING.   NO OTHER PROBLEMS SINCE.   Personal history of radiation therapy 2012   Right Breast Cancer   Pneumonia    Thyroid  mass    Vocal cord paralysis    left   Past Surgical History:  Procedure Laterality Date   AMPUTATION     partial amputation of both index fingers   BREAST LUMPECTOMY Right 04/01/2010   BREAST SURGERY  2011   lumpectomy with node sampling- RIGHT   CATARACT EXTRACTION, BILATERAL  approx 2017   COLONOSCOPY  06/17/2022   per Dr. General Kenner, one adenomatous polyp, no repeats needed   ESOPHAGOGASTRODUODENOSCOPY     EXCISION MASS NECK Left 12/26/2016   Procedure: EXCISION MASS NECK;  Surgeon: Janita Mellow, MD;  Location: [facility] OR;  Service: ENT;  Laterality: Left;  open excision of thyroid  mass left side with frozen section   HAND SURGERY Bilateral 1995   Amputaed pointer fingers bilaterally   IR FLUORO GUIDE PORT INSERTION RIGHT  01/14/2017   IR GASTROSTOMY TUBE MOD SED  02/19/2017   IR GASTROSTOMY TUBE REMOVAL  12/07/2018   IR PATIENT EVAL TECH 0-60 MINS  01/14/2019   IR REMOVAL TUN ACCESS W/ PORT W/O FL MOD SED  11/30/2017  IR REPLACE G-TUBE SIMPLE WO FLUORO  08/03/2017   IR REPLACE G-TUBE SIMPLE WO FLUORO  06/10/2018   IR REPLC GASTRO/COLONIC TUBE PERCUT W/FLUORO  02/25/2017   IR REPLC GASTRO/COLONIC TUBE PERCUT W/FLUORO  03/09/2017   IR US  GUIDE BX ASP/DRAIN  12/07/2018   IR US  GUIDE VASC ACCESS RIGHT  01/14/2017   JOINT REPLACEMENT     LAPAROSCOPIC GASTRIC RESECTION N/A 09/09/2019   Procedure: LAPAROSCOPIC CLOSURE OF GASTRO-CUTANEOUS FISTULA, EXCISION OF FISTUAL TRACK;  Surgeon: Juanita Norlander, MD;  Location: [facility] ORS;  Service: General;  Laterality: N/A;   LUMBAR LAMINECTOMY/DECOMPRESSION MICRODISCECTOMY N/A 01/30/2022   Procedure: Microdiscetomy  hemilaminectomy decompression Lumbar Five-Sacral One;  Surgeon: Orvan Blanch, MD;  Location: [facility] OR;  Service: Orthopedics;  Laterality: N/A;  90 mins 3 C-Bed   TONSILLECTOMY     TOTAL HIP ARTHROPLASTY  12/16/2011   right hip   TOTAL HIP ARTHROPLASTY  01/20/2012   Procedure: TOTAL HIP ARTHROPLASTY ANTERIOR APPROACH;  Surgeon: Bevin Bucks, MD;  Location: [facility] OR[facility];  Service: Orthopedics;  Laterality: Left;   TOTAL KNEE ARTHROPLASTY Right 05/31/2018   Procedure: TOTAL KNEE ARTHROPLASTY;  Surgeon: Liliane Rei, MD;  Location: [facility] OR[facility];  Service: Orthopedics;  Laterality: Right;  Adductor Block   TUBAL LIGATION     VASCULAR SURGERY     both hands    reports that she quit smoking about 30 years ago. Her smoking use included cigarettes. She started smoking about 50 years ago. She has a 20 pack-year smoking history. She has never used smokeless tobacco. She reports that she does not currently use alcohol. She reports that she does not use drugs. family history includes Breast cancer in her sister; Cancer in her mother; Coronary artery disease in her brother, brother, and father; Diabetes in her father and sister; Hearing loss in her mother. Allergies  Allergen Reactions   Macrobid [Nitrofurantoin] Rash    Vasculitic rash    Review of Systems  Constitutional:  Negative for chills, fever and malaise/fatigue.  Eyes:  Negative for blurred vision.  Respiratory:  Positive for shortness of breath. Negative for cough, hemoptysis and wheezing.   Cardiovascular:  Negative for chest pain and leg swelling.  Neurological:  Negative for dizziness, weakness and headaches.      Objective:     BP 130/64 (BP Location: Left Arm, Patient Position: Sitting, Cuff Size: Large)   Pulse 81   Temp 97.9 F (36.6 C) (Oral)   Ht 5' 0.24" (1.53 m)   Wt 192 lb 11.2 oz (87.4 kg)   SpO2 99%   BMI 37.34 kg/m  BP Readings from Last 3 Encounters:  08/12/23 130/64  12/10/22 106/60  12/01/22 130/64   Wt Readings  from Last 3 Encounters:  08/12/23 192 lb 11.2 oz (87.4 kg)  12/01/22 175 lb (79.4 kg)  10/09/22 183 lb (83 kg)      Physical Exam Vitals reviewed.  Constitutional:      Appearance: She is well-developed.  Eyes:     Pupils: Pupils are equal, round, and reactive to light.  Neck:     Thyroid : No thyromegaly.     Vascular: No JVD.  Cardiovascular:     Rate and Rhythm: Normal rate and regular rhythm.     Heart sounds:     No gallop.     Comments: Soft 2/6 systolic ejection murmur right upper sternal border. Pulmonary:     Effort: Pulmonary effort is normal. No respiratory distress.     Breath sounds: Normal breath  sounds. No wheezing or rales.  Musculoskeletal:     Cervical back: Neck supple.     Right lower leg: No edema.     Left lower leg: No edema.  Neurological:     Mental Status: She is alert.      No results found for any visits on 08/12/23.  Last CBC Lab Results  Component Value Date   WBC 11.5 (H) 11/18/2022   HGB 11.1 (L) 11/18/2022   HCT 35.1 (L) 11/18/2022   MCV 78.4 11/18/2022   MCH 28.0 09/08/2022   RDW 16.1 (H) 11/18/2022   PLT 414.0 (H) 11/18/2022   Last metabolic panel Lab Results  Component Value Date   GLUCOSE 97 11/18/2022   NA 135 11/18/2022   K 3.9 11/18/2022   CL 101 11/18/2022   CO2 24 11/18/2022   BUN 39 (H) 11/18/2022   CREATININE 0.93 11/18/2022   GFR 59.82 (L) 11/18/2022   CALCIUM  9.2 11/18/2022   PHOS 2.5 09/01/2022   PROT 7.1 08/30/2022   ALBUMIN  3.0 (L) 08/30/2022   BILITOT 0.9 08/30/2022   ALKPHOS 65 08/30/2022   AST 33 08/30/2022   ALT 6 08/30/2022   ANIONGAP 9 09/08/2022   Last lipids Lab Results  Component Value Date   CHOL 194 07/22/2022   HDL 53.00 07/22/2022   LDLCALC 124 (H) 07/22/2022   LDLDIRECT 146.4 06/26/2006   TRIG 87.0 07/22/2022   CHOLHDL 4 07/22/2022   Last hemoglobin A1c Lab Results  Component Value Date   HGBA1C 6.1 07/22/2022   Last thyroid  functions Lab Results  Component Value Date    TSH 0.39 07/22/2022      The 10-year ASCVD risk score (Arnett DK, et al., 2019) is: 41%    Assessment & Plan:   Problem List Items Addressed This Visit       Unprioritized   Essential hypertension   Relevant Orders   CMP   Hypothyroidism - Primary   Relevant Orders   TSH   Chronic kidney disease   Relevant Orders   CMP   Impaired glucose tolerance   Relevant Orders   Hemoglobin A1c   Other Visit Diagnoses       Elevated LDH       Relevant Orders   Lactate Dehydrogenase     Chlorotic anemia       Relevant Orders   CBC with Differential/Platelet     Patient has multiple chronic problems above.  Check labs as above.  Repeating LDH per oncology request.  Suspect her dyspnea is related to deconditioning.  We recommend she try to gradually build up her stamina and also she is challenged to try to lose some weight.  Be in touch if she has any chest pain or progressive dyspnea  Return in about 6 months (around 02/11/2024).    Glean Lamy, MD

## 2023-08-13 LAB — LACTATE DEHYDROGENASE: LDH: 181 U/L (ref 120–250)

## 2023-08-26 ENCOUNTER — Ambulatory Visit
Admission: RE | Admit: 2023-08-26 | Discharge: 2023-08-26 | Disposition: A | Discharge status: Home / Self Care | Source: Ambulatory Visit | Attending: Family Medicine | Admitting: Family Medicine

## 2023-08-26 DIAGNOSIS — Z1231 Encounter for screening mammogram for malignant neoplasm of breast: Secondary | ICD-10-CM | POA: Diagnosis not present

## 2023-11-07 ENCOUNTER — Other Ambulatory Visit: Payer: Self-pay | Admitting: Family Medicine

## 2023-11-07 DIAGNOSIS — E039 Hypothyroidism, unspecified: Secondary | ICD-10-CM

## 2023-11-12 ENCOUNTER — Ambulatory Visit: Admitting: Family Medicine

## 2023-11-12 ENCOUNTER — Encounter: Payer: Self-pay | Admitting: Family Medicine

## 2023-11-12 VITALS — Wt 188.0 lb

## 2023-11-12 DIAGNOSIS — Z0000 Encounter for general adult medical examination without abnormal findings: Secondary | ICD-10-CM | POA: Diagnosis not present

## 2023-11-12 NOTE — Patient Instructions (Signed)
 I really enjoyed getting to talk with you today! I am available on Tuesdays and Thursdays for virtual visits if you have any questions or concerns, or if I can be of any further assistance.   CHECKLIST FROM ANNUAL WELLNESS VISIT:  -Follow up (please call to schedule if not scheduled after visit):   -yearly for annual wellness visit with primary care office  Here is a list of your preventive care/health maintenance measures and the plan for each if any are due:  PLAN For any measures below that may be due:   Health Maintenance  Topic Date Due   COVID-19 Vaccine (9 - Mixed Product risk 2024-25 season) 07/14/2023   INFLUENZA VACCINE  11/20/2023   MAMMOGRAM  08/25/2024   Medicare Annual Wellness (AWV)  11/11/2024   DTaP/Tdap/Td (2 - Td or Tdap) 01/05/2032   Pneumococcal Vaccine: 50+ Years  Completed   DEXA SCAN  Completed   Hepatitis C Screening  Completed   Zoster Vaccines- Shingrix   Completed   Hepatitis B Vaccines  Aged Out   HPV VACCINES  Aged Out   Meningococcal B Vaccine  Aged Out   Colonoscopy  Discontinued    -See a dentist at least yearly  -Get your eyes checked and then per your eye specialist's recommendations  -Other issues addressed today:   -I have included below further information regarding a healthy whole foods based diet, physical activity guidelines for adults, stress management and opportunities for social connections. I hope you find this information useful.   -----------------------------------------------------------------------------------------------------------------------------------------------------------------------------------------------------------------------------------------------------------    NUTRITION: -eat real food: lots of colorful vegetables (half the plate) and fruits -5-7 servings of vegetables and fruits per day (fresh or steamed is best), exp. 2 servings of vegetables with lunch and dinner and 2 servings of fruit per day. Berries  and greens such as kale and collards are great choices.  -consume on a regular basis:  fresh fruits, fresh veggies, fish, nuts, seeds, healthy oils (such as olive oil, avocado oil), whole grains (make sure for bread/pasta/crackers/etc., that the first ingredient on label contains the word whole), legumes. -can eat small amounts of dairy and lean meat (no larger than the palm of your hand), but avoid processed meats such as ham, bacon, lunch meat, etc. -drink water  -try to avoid fast food and pre-packaged foods, processed meat, ultra processed foods/beverages (donuts, candy, etc.) -most experts advise limiting sodium to < 2300mg  per day, should limit further is any chronic conditions such as high blood pressure, heart disease, diabetes, etc. The American Heart Association advised that < 1500mg  is is ideal -try to avoid foods/beverages that contain any ingredients with names you do not recognize  -try to avoid foods/beverages  with added sugar or sweeteners/sweets  -try to avoid sweet drinks (including diet drinks): soda, juice, Gatorade, sweet tea, power drinks, diet drinks -try to avoid white rice, white bread, pasta (unless whole grain)  EXERCISE GUIDELINES FOR ADULTS: -if you wish to increase your physical activity, do so gradually and with the approval of your doctor -STOP and seek medical care immediately if you have any chest pain, chest discomfort or trouble breathing when starting or increasing exercise  -move and stretch your body, legs, feet and arms when sitting for long periods -Physical activity guidelines for optimal health in adults: -get at least 150 minutes per week of moderate exercise (can talk, but not sing); this is about 20-30 minutes of sustained activity 5-7 days per week or two 10-15 minute episodes of sustained activity 5-7 days  per week -do some muscle building/resistance training/strength training at least 2 days per week  -balance exercises 3+ days per week:   Stand  somewhere where you have something sturdy to hold onto if you lose balance    1) lift up on toes, then back down, start with 5x per day and work up to 20x   2) stand and lift one leg straight out to the side so that foot is a few inches of the floor, start with 5x each side and work up to 20x each side   3) stand on one foot, start with 5 seconds each side and work up to 20 seconds on each side  If you need ideas or help with getting more active:  -Silver  sneakers https://tools.silversneakers.com  -Walk with a Doc: http://www.duncan-williams.com/  -try to include resistance (weight lifting/strength building) and balance exercises twice per week: or the following link for ideas: http://castillo-powell.com/  BuyDucts.dk  STRESS MANAGEMENT: -can try meditating, or just sitting quietly with deep breathing while intentionally relaxing all parts of your body for 5 minutes daily -if you need further help with stress, anxiety or depression please follow up with your primary doctor or contact the wonderful folks at [facility]: [phone]  SOCIAL CONNECTIONS: -options in [place] if you wish to engage in more social and exercise related activities:  -Silver  sneakers https://tools.silversneakers.com  -Walk with a Doc: http://www.duncan-williams.com/  -Check out the [facility] Active Adults 50+ section on the [place] of [employer] (hiking clubs, book clubs, cards and games, chess, exercise classes, aquatic classes and much more) - see the website for details: https://www.[place].gov/departments/parks-recreation/active-adults50  -YouTube has lots of exercise videos for different ages and abilities as well  -[facility] (a variety of indoor and outdoor inperson activities for adults). [phone]. [address]  -Virtual Online Classes (a variety of topics): see  seniorplanet.org or call [phone]  -consider volunteering at a school, hospice center, church, senior center or elsewhere

## 2023-11-12 NOTE — Progress Notes (Signed)
 PATIENT CHECK-IN and HEALTH RISK ASSESSMENT QUESTIONNAIRE:  -completed by phone/video for upcoming Medicare Preventive Visit  Pre-Visit Check-in: 1)Vitals (height, wt, BP, etc) - record in vitals section for visit on day of visit Request home vitals (wt, BP, etc.) and enter into vitals, THEN update Vital Signs SmartPhrase below at the top of the HPI. See below.  2)Review and Update Medications, Allergies PMH, Surgeries, Social history in Epic 3)Hospitalizations in the last year with date/reason? no  4)Review and Update Care Team (patient's specialists) in Epic 5) Complete PHQ9 in Epic  6) Complete Fall Screening in Epic 7)Review all Health Maintenance Due and order if not done.  Medicare Wellness Patient Questionnaire:  Answer theses question about your habits: How often do you have a drink containing alcohol?2-3 weekly  How many drinks containing alcohol do you have on a typical day when you are drinking?1 wine How often do you have six or more drinks on one occasion?n Have you ever smoked?yes  Quit date if applicable? 40 years ago  How many packs a day do/did you smoke? 1 Do you use smokeless tobacco? no Do you use an illicit drugs? no On average, how many days per week do you engage in moderate to strenuous exercise (like a brisk walk)? [facility], also swims at home daily On average, how many minutes do you engage in exercise at this level? Water  aerobics for hour or in her pool for an hour Typical breakfast: Varies - oatmeal, sometimes skips breakfast Typical lunch: [place] Sandwich  Typical dinner:Varies, usually chicken with broccoli or veggies Typical snacks: cookies   Beverages:  water , sometimes with have soda   Answer theses question about your everyday activities: Can you perform most household chores? Yes  Are you deaf or have significant trouble hearing? no Do you feel that you have a problem with memory?no Do you feel safe at home?yes  Last  dentist visit? 3 months ago  8. Do you have any difficulty performing your everyday activities?no Are you having any difficulty walking, taking medications on your own, and or difficulty managing daily home needs?no Do you have difficulty walking or climbing stairs?no Do you have difficulty dressing or bathing? no Do you have difficulty doing errands alone such as visiting a doctor's office or shopping? no Do you currently have any difficulty preparing food and eating?no Do you currently have any difficulty using the toilet?no Do you have any difficulty managing your finances?no Do you have any difficulties with housekeeping of managing your housekeeping?no   Do you have Advanced Directives in place (Living Will, Healthcare Power or Attorney)? Yes    Last eye Exam and location? 6 months ago    Do you currently use prescribed or non-prescribed narcotic or opioid pain medications? no  Do you have a history or close family history of breast, ovarian, tubal or peritoneal cancer or a family member with BRCA (breast cancer susceptibility 1 and 2) gene mutations? Yes breast cancer     Nurse/Assistant Credentials/time stamp: MG 2:35 pm     ----------------------------------------------------------------------------------------------------------------------------------------------------------------------------------------------------------------------  Because this visit was a virtual/telehealth visit, some criteria may be missing or patient reported. Any vitals not documented were not able to be obtained and vitals that have been documented are patient reported.    MEDICARE ANNUAL PREVENTIVE VISIT WITH PROVIDER: (Welcome to Medicare, initial annual wellness or annual wellness exam)  Virtual Visit via Phone Note  I connected with Kristen Escobar on 11/12/23 by phone and  verified that I am speaking with the correct person using two identifiers. She prefers phone visit.   Location  patient: home Location provider:work or home office Persons participating in the virtual visit: patient, provider  Concerns and/or follow up today: reports is doing well, no concerns.    See HM section in Epic for other details of completed HM.    ROS: negative for report of fevers, unintentional weight loss, vision changes, vision loss, hearing loss or change, chest pain, sob, hemoptysis, melena, hematochezia, hematuria, falls, bleeding or bruising, thoughts of suicide or self harm, memory loss  Patient-completed extensive health risk assessment - reviewed and discussed with the patient: See Health Risk Assessment completed with patient prior to the visit either above or in recent phone note. This was reviewed in detailed with the patient today and appropriate recommendations, orders and referrals were placed as needed per Summary below and patient instructions.   Review of Medical History: -PMH, PSH, Family History and current specialty and care providers reviewed and updated and listed below   Patient Care Team: Micheal Wolm ORN, MD as PCP - General (Family Medicine) Fernand Evans, MD as Consulting Physician (Internal Medicine) Keenan Hastings, MD as Consulting Physician (Radiation Oncology) Odean Potts, MD as Consulting Physician (Hematology and Oncology) Melodi Lerner, MD as Consulting Physician (Orthopedic Surgery) Joshua Sieving, MD as Consulting Physician (Dermatology) Robinson Mayo, OD as Consulting Physician (Optometry) Brien Garnette Pepper, MD as Consulting Physician (Otolaryngology)   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   Diffuse large B cell lymphoma (HCC) dx'd 02/17/17   in remission 2 years now   had chemo    GERD (gastroesophageal  reflux disease)    Heart murmur    07/03/21 echo: LVEF 60-65%, grade I DD, normal PASP, trivial MR, mild AI   History of radiation therapy 07/12/10,completed   right breast 60 Gy x30 fx   Hypertension    no meds currently   Hypothyroidism    Lymphoma (HCC)    Obesity    PE (pulmonary thromboembolism) (HCC) 01/2017   in setting of chemotherapy for lymphoma   Peripheral vascular disease (HCC) 1995   PT DEVELOPED CIRCULATION PROBLEMS IN BOTH HANDS AND GANGRENE OF BOTH INDEX FINGERS--REQUIRING AMPUTATION OF THE INDEX FINGERS AND VASCULAR SURGERY.  PT TOLD HER PROBLEMS RELATED TO SMOKING.   NO OTHER PROBLEMS SINCE.   Personal history of radiation therapy 2012   Right Breast Cancer   Pneumonia    Thyroid  mass    Vocal cord paralysis    left    Past Surgical History:  Procedure Laterality Date   AMPUTATION     partial amputation of both index fingers   BREAST LUMPECTOMY Right 04/01/2010   BREAST SURGERY  2011   lumpectomy with node sampling- RIGHT   CATARACT EXTRACTION, BILATERAL  approx 2017   COLONOSCOPY  06/17/2022   per Dr. Leigh, one adenomatous polyp, no repeats needed   ESOPHAGOGASTRODUODENOSCOPY     EXCISION MASS NECK Left 12/26/2016   Procedure: EXCISION MASS NECK;  Surgeon: Jesus Oliphant, MD;  Location[facility];  Service: ENT;  Laterality: Left;  open excision of thyroid  mass left side with frozen section   HAND SURGERY Bilateral 1995   Amputaed pointer fingers bilaterally  IR FLUORO GUIDE PORT INSERTION RIGHT  01/14/2017   IR GASTROSTOMY TUBE MOD SED  02/19/2017   IR GASTROSTOMY TUBE REMOVAL  12/07/2018   IR PATIENT EVAL TECH 0-60 MINS  01/14/2019   IR REMOVAL TUN ACCESS W/ PORT W/O FL MOD SED  11/30/2017   IR REPLACE G-TUBE SIMPLE WO FLUORO  08/03/2017   IR REPLACE G-TUBE SIMPLE WO FLUORO  06/10/2018   IR REPLC GASTRO/COLONIC TUBE PERCUT W/FLUORO  02/25/2017   IR REPLC GASTRO/COLONIC TUBE PERCUT W/FLUORO  03/09/2017   IR US  GUIDE BX ASP/DRAIN  12/07/2018   IR US   GUIDE VASC ACCESS RIGHT  01/14/2017   JOINT REPLACEMENT     LAPAROSCOPIC GASTRIC RESECTION N/A 09/09/2019   Procedure: LAPAROSCOPIC CLOSURE OF GASTRO-CUTANEOUS FISTULA, EXCISION OF FISTUAL TRACK;  Surgeon: Ethyl Lenis, MD;  Location: [facility];  Service: General;  Laterality: N/A;   LUMBAR LAMINECTOMY/DECOMPRESSION MICRODISCECTOMY N/A 01/30/2022   Procedure: Microdiscetomy hemilaminectomy decompression Lumbar Five-Sacral One;  Surgeon: Duwayne Purchase, MD;  Location: [facility] OR;  Service: Orthopedics;  Laterality: N/A;  90 mins 3 C-Bed   TONSILLECTOMY     TOTAL HIP ARTHROPLASTY  12/16/2011   right hip   TOTAL HIP ARTHROPLASTY  01/20/2012   Procedure: TOTAL HIP ARTHROPLASTY ANTERIOR APPROACH;  Surgeon: Donnice JONETTA Car, MD;  Location: [facility];  Service: Orthopedics;  Laterality: Left;   TOTAL KNEE ARTHROPLASTY Right 05/31/2018   Procedure: TOTAL KNEE ARTHROPLASTY;  Surgeon: Melodi Lerner, MD;  Location: [facility];  Service: Orthopedics;  Laterality: Right;  Adductor Block   TUBAL LIGATION     VASCULAR SURGERY     both hands    Social History   Socioeconomic History   Marital status: Married    Spouse name: Not on file   Number of children: 3   Years of education: Not on file   Highest education level: Not on file  Occupational History   Occupation: retired  Tobacco Use   Smoking status: Former    Current packs/day: 0.00    Average packs/day: 1 pack/day for 20.0 years (20.0 ttl pk-yrs)    Types: Cigarettes    Start date: 04/21/1973    Quit date: 04/21/1993    Years since quitting: 30.5   Smokeless tobacco: Never  Vaping Use   Vaping status: Never Used  Substance and Sexual Activity   Alcohol use: Not Currently    Comment: OCCAS - MAYBE ONCE A MONTH   Drug use: No   Sexual activity: Not Currently  Other Topics Concern   Not on file  Social History Narrative   Not on file   Social Drivers of Health   Financial Resource Strain: Low Risk  (11/12/2023)   Overall Financial Resource Strain  (CARDIA)    Difficulty of Paying Living Expenses: Not hard at all  Food Insecurity: No Food Insecurity (11/12/2023)   Hunger Vital Sign    Worried About Running Out of Food in the Last Year: Never true    Ran Out of Food in the Last Year: Never true  Transportation Needs: No Transportation Needs (11/12/2023)   PRAPARE - Administrator, Civil Service (Medical): No    Lack of Transportation (Non-Medical): No  Physical Activity: Inactive (11/12/2023)   Exercise Vital Sign    Days of Exercise per Week: 0 days    Minutes of Exercise per Session: 0 min  Stress: No Stress Concern Present (11/12/2023)   [employer] of Occupational Health - Occupational Stress Questionnaire    Feeling  of Stress: Not at all  Social Connections: Moderately Integrated (11/12/2023)   Social Connection and Isolation Panel    Frequency of Communication with Friends and Family: More than three times a week    Frequency of Social Gatherings with Friends and Family: More than three times a week    Attends Religious Services: Never    Database administrator or Organizations: Yes    Attends Engineer, structural: More than 4 times per year    Marital Status: Married  Catering manager Violence: Not At Risk (11/12/2023)   Humiliation, Afraid, Rape, and Kick questionnaire    Fear of Current or Ex-Partner: No    Emotionally Abused: No    Physically Abused: No    Sexually Abused: No    Family History  Problem Relation Age of Onset   Cancer Mother        pt unaware of what kind   Hearing loss Mother    Coronary artery disease Father    Diabetes Father    Diabetes Sister    Breast cancer Sister    Coronary artery disease Brother    Coronary artery disease Brother    Colon cancer Neg Hx    Stomach cancer Neg Hx    Rectal cancer Neg Hx    Esophageal cancer Neg Hx     Current Outpatient Medications on File Prior to Visit  Medication Sig Dispense Refill   amLODipine  (NORVASC ) 5 MG tablet TAKE  1 TABLET BY MOUTH EVERY DAY 90 tablet 0   COVID-19 mRNA vaccine, Pfizer, (COMIRNATY ) syringe Inject into the muscle. 0.3 mL 0   furosemide  (LASIX ) 20 MG tablet Take by mouth once daily for 3 days and then 1 daily as needed for leg edema 30 tablet 0   levothyroxine  (SYNTHROID ) 75 MCG tablet TAKE 1 TABLET BY MOUTH EVERY DAY 90 tablet 0   Multiple Vitamins-Minerals (ADULT GUMMY PO) Take 2 tablets by mouth daily. Woman's Gummy     omeprazole  (PRILOSEC ) 20 MG capsule TAKE 1 CAPSULE BY MOUTH EVERY DAY 90 capsule 0   polyethylene glycol (MIRALAX  / GLYCOLAX ) 17 g packet Take 17 g by mouth daily as needed for moderate constipation. 14 each 0   No current facility-administered medications on file prior to visit.    Allergies  Allergen Reactions   Macrobid [Nitrofurantoin] Rash    Vasculitic rash       Physical Exam Vitals requested from patient and listed below if patient had equipment and was able to obtain at home for this virtual visit: There were no vitals filed for this visit. Estimated body mass index is 36.43 kg/m as calculated from the following:   Height as of 08/12/23: 5' 0.24 (1.53 m).   Weight as of this encounter: 188 lb (85.3 kg).  EKG (optional): deferred due to virtual visit  GENERAL: alert, oriented, no acute distress detected, full vision exam deferred due to pandemic and/or virtual encounter  PSYCH/NEURO: pleasant and cooperative, no obvious depression or anxiety, speech and thought processing grossly intact, Cognitive function grossly intact  Flowsheet Row Office Visit from 10/09/2022 in [facility]  PHQ-9 Total Score 0        11/12/2023    2:29 PM 10/09/2022    3:49 PM 10/09/2022    3:26 PM 07/22/2022    9:00 AM 04/22/2022   10:12 AM  Depression screen PHQ 2/9  Decreased Interest 0 0 0 0 0  Down, Depressed, Hopeless 0 0 0  0 0  PHQ - 2 Score 0 0 0 0 0  Altered sleeping   0 0   Tired, decreased energy   0 0   Change in appetite   0  0   Feeling bad or failure about yourself    0 0   Trouble concentrating   0 0   Moving slowly or fidgety/restless   0 0   Suicidal thoughts   0 0   PHQ-9 Score   0 0   Difficult doing work/chores   Extremely dIfficult Not difficult at all        10/10/2021   10:43 AM 01/30/2022    9:51 AM 04/22/2022   10:12 AM 07/22/2022    9:00 AM 11/12/2023    2:29 PM  Fall Risk  Falls in the past year? 0  0 0 0  Was there an injury with Fall? 0  0 0 0  Fall Risk Category Calculator 0  0 0 0  Fall Risk Category (Retired) Low   Low     (RETIRED) Patient Fall Risk Level Low fall risk  Moderate fall risk  Low fall risk     Patient at Risk for Falls Due to No Fall Risks  No Fall Risks No Fall Risks No Fall Risks  Fall risk Follow up   Falls evaluation completed  Falls evaluation completed Falls evaluation completed     Data saved with a previous flowsheet row definition     SUMMARY AND PLAN:  Encounter for Medicare annual wellness exam   Discussed applicable health maintenance/preventive health measures and advised and referred or ordered per patient preferences: -discussed fall vaccines, advised if gets at pharmacy to let us  know so that we can update record -had normal bone density in 2023  Health Maintenance  Topic Date Due   COVID-19 Vaccine (9 - Mixed Product risk 2024-25 season) 07/14/2023   INFLUENZA VACCINE  11/20/2023   MAMMOGRAM  08/25/2024   Medicare Annual Wellness (AWV)  11/11/2024   DTaP/Tdap/Td (2 - Td or Tdap) 01/05/2032   Pneumococcal Vaccine: 50+ Years  Completed   DEXA SCAN  Completed   Hepatitis C Screening  Completed   Zoster Vaccines- Shingrix   Completed   Hepatitis B Vaccines  Aged Out   HPV VACCINES  Aged Out   Meningococcal B Vaccine  Aged Out   Colonoscopy  Discontinued      Education and counseling on the following was provided based on the above review of health and a plan/checklist for the patient, along with additional information discussed, was provided  for the patient in the patient instructions :   -Provided safe balance exercises that can be done at home to improve balance and discussed exercise guidelines for adults with include balance exercises at least 3 days per week.  -Advised and counseled on a healthy lifestyle - including the importance of a healthy diet, regular physical activity, social connections and stress management. -Reviewed patient's current diet. Advised and counseled on a whole foods based healthy diet. A summary of a healthy diet was provided in the Patient Instructions.  -reviewed patient's current physical activity level and discussed exercise guidelines for adults. Congratulated on healthy habits. Discussed community resources and ideas for safe exercise at home to assist in meeting exercise guideline recommendations in a safe and healthy way.  -Advise yearly dental visits at minimum and regular eye exams -Advised and counseled on alcohol safe limits, risks  Follow up: see patient instructions  Patient Instructions  I really enjoyed getting to talk with you today! I am available on Tuesdays and Thursdays for virtual visits if you have any questions or concerns, or if I can be of any further assistance.   CHECKLIST FROM ANNUAL WELLNESS VISIT:  -Follow up (please call to schedule if not scheduled after visit):   -yearly for annual wellness visit with primary care office  Here is a list of your preventive care/health maintenance measures and the plan for each if any are due:  PLAN For any measures below that may be due:   Health Maintenance  Topic Date Due   COVID-19 Vaccine (9 - Mixed Product risk 2024-25 season) 07/14/2023   INFLUENZA VACCINE  11/20/2023   MAMMOGRAM  08/25/2024   Medicare Annual Wellness (AWV)  11/11/2024   DTaP/Tdap/Td (2 - Td or Tdap) 01/05/2032   Pneumococcal Vaccine: 50+ Years  Completed   DEXA SCAN  Completed   Hepatitis C Screening  Completed   Zoster Vaccines- Shingrix    Completed   Hepatitis B Vaccines  Aged Out   HPV VACCINES  Aged Out   Meningococcal B Vaccine  Aged Out   Colonoscopy  Discontinued    -See a dentist at least yearly  -Get your eyes checked and then per your eye specialist's recommendations  -Other issues addressed today:   -I have included below further information regarding a healthy whole foods based diet, physical activity guidelines for adults, stress management and opportunities for social connections. I hope you find this information useful.   -----------------------------------------------------------------------------------------------------------------------------------------------------------------------------------------------------------------------------------------------------------    NUTRITION: -eat real food: lots of colorful vegetables (half the plate) and fruits -5-7 servings of vegetables and fruits per day (fresh or steamed is best), exp. 2 servings of vegetables with lunch and dinner and 2 servings of fruit per day. Berries and greens such as kale and collards are great choices.  -consume on a regular basis:  fresh fruits, fresh veggies, fish, nuts, seeds, healthy oils (such as olive oil, avocado oil), whole grains (make sure for bread/pasta/crackers/etc., that the first ingredient on label contains the word whole), legumes. -can eat small amounts of dairy and lean meat (no larger than the palm of your hand), but avoid processed meats such as ham, bacon, lunch meat, etc. -drink water  -try to avoid fast food and pre-packaged foods, processed meat, ultra processed foods/beverages (donuts, candy, etc.) -most experts advise limiting sodium to < 2300mg  per day, should limit further is any chronic conditions such as high blood pressure, heart disease, diabetes, etc. The American Heart Association advised that < 1500mg  is is ideal -try to avoid foods/beverages that contain any ingredients with names you do not recognize   -try to avoid foods/beverages  with added sugar or sweeteners/sweets  -try to avoid sweet drinks (including diet drinks): soda, juice, Gatorade, sweet tea, power drinks, diet drinks -try to avoid white rice, white bread, pasta (unless whole grain)  EXERCISE GUIDELINES FOR ADULTS: -if you wish to increase your physical activity, do so gradually and with the approval of your doctor -STOP and seek medical care immediately if you have any chest pain, chest discomfort or trouble breathing when starting or increasing exercise  -move and stretch your body, legs, feet and arms when sitting for long periods -Physical activity guidelines for optimal health in adults: -get at least 150 minutes per week of moderate exercise (can talk, but not sing); this is about 20-30 minutes of sustained activity 5-7 days per week or two 10-15 minute episodes of  sustained activity 5-7 days per week -do some muscle building/resistance training/strength training at least 2 days per week  -balance exercises 3+ days per week:   Stand somewhere where you have something sturdy to hold onto if you lose balance    1) lift up on toes, then back down, start with 5x per day and work up to 20x   2) stand and lift one leg straight out to the side so that foot is a few inches of the floor, start with 5x each side and work up to 20x each side   3) stand on one foot, start with 5 seconds each side and work up to 20 seconds on each side  If you need ideas or help with getting more active:  -Silver  sneakers https://tools.silversneakers.com  -Walk with a Doc: http://www.duncan-williams.com/  -try to include resistance (weight lifting/strength building) and balance exercises twice per week: or the following link for ideas: http://castillo-powell.com/  BuyDucts.dk  STRESS MANAGEMENT: -can try meditating, or just sitting quietly with deep breathing  while intentionally relaxing all parts of your body for 5 minutes daily -if you need further help with stress, anxiety or depression please follow up with your primary doctor or contact the wonderful folks at [facility]: [phone]  SOCIAL CONNECTIONS: -options in [place] if you wish to engage in more social and exercise related activities:  -Silver  sneakers https://tools.silversneakers.com  -Walk with a Doc: http://www.duncan-williams.com/  -Check out the [facility] Active Adults 50+ section on the [place] [employer] (hiking clubs, book clubs, cards and games, chess, exercise classes, aquatic classes and much more) - see the website for details: https://www.[place].gov/departments/parks-recreation/active-adults50  -YouTube has lots of exercise videos for different ages and abilities as well  -[facility] (a variety of indoor and outdoor inperson activities for adults). 587-076-7603. [address]  -Virtual Online Classes (a variety of topics): see seniorplanet.org or call 7733341573  -consider volunteering at a school, hospice center, church, senior center or elsewhere            Chiquita JONELLE Cramp, DO

## 2023-11-12 NOTE — Progress Notes (Signed)
 Patient unable to obtain vital signs due to telehealth visit

## 2023-12-02 DIAGNOSIS — Z86711 Personal history of pulmonary embolism: Secondary | ICD-10-CM | POA: Diagnosis not present

## 2023-12-02 DIAGNOSIS — J3801 Paralysis of vocal cords and larynx, unilateral: Secondary | ICD-10-CM | POA: Diagnosis not present

## 2023-12-02 DIAGNOSIS — Z79899 Other long term (current) drug therapy: Secondary | ICD-10-CM | POA: Diagnosis not present

## 2023-12-02 DIAGNOSIS — R1314 Dysphagia, pharyngoesophageal phase: Secondary | ICD-10-CM | POA: Diagnosis not present

## 2023-12-02 DIAGNOSIS — R633 Feeding difficulties, unspecified: Secondary | ICD-10-CM | POA: Diagnosis not present

## 2023-12-02 DIAGNOSIS — K2289 Other specified disease of esophagus: Secondary | ICD-10-CM | POA: Diagnosis not present

## 2023-12-09 DIAGNOSIS — K2289 Other specified disease of esophagus: Secondary | ICD-10-CM | POA: Diagnosis not present

## 2023-12-09 DIAGNOSIS — J3801 Paralysis of vocal cords and larynx, unilateral: Secondary | ICD-10-CM | POA: Diagnosis not present

## 2023-12-09 DIAGNOSIS — K222 Esophageal obstruction: Secondary | ICD-10-CM | POA: Diagnosis not present

## 2023-12-09 DIAGNOSIS — R1314 Dysphagia, pharyngoesophageal phase: Secondary | ICD-10-CM | POA: Diagnosis not present

## 2023-12-09 DIAGNOSIS — R1310 Dysphagia, unspecified: Secondary | ICD-10-CM | POA: Diagnosis not present

## 2023-12-18 ENCOUNTER — Encounter: Payer: Self-pay | Admitting: Family Medicine

## 2023-12-18 ENCOUNTER — Ambulatory Visit (INDEPENDENT_AMBULATORY_CARE_PROVIDER_SITE_OTHER): Admitting: Family Medicine

## 2023-12-18 VITALS — BP 190/60 | HR 86 | Temp 98.0°F | Wt 190.2 lb

## 2023-12-18 DIAGNOSIS — I10 Essential (primary) hypertension: Secondary | ICD-10-CM

## 2023-12-18 MED ORDER — LOSARTAN POTASSIUM-HCTZ 50-12.5 MG PO TABS: 1.0000 | ORAL_TABLET | Freq: Every day | ORAL | 3 refills | Status: AC

## 2023-12-18 NOTE — Progress Notes (Signed)
 Established Patient Office Visit  Subjective   Patient ID: Kristen Escobar, female    DOB: 03-22-1946  Age: 78 y.o. MRN: [id number]  Chief Complaint  Patient presents with   Hypertension    HPI   Kristen Escobar is here to discuss blood pressure.  She has hypertension currently treated with amlodipine  5 mg daily.  Previously took losartan /HCTZ.  She was diagnosed with cancer several years ago and when going through chemo blood pressures dropped and she was taken off the losartan  HCTZ.  She recently went for endoscopic procedure and had blood pressure of 225 systolic.  She brings in a log of home readings and has had several readings 140 systolic up to 159 with diastolics mostly 50s and 60s.  No headaches or dizziness.  No recent dietary changes.  Infrequent alcohol use.  No regular nonsteroidal use.  No recent dietary changes.  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   Diffuse large B cell lymphoma (HCC) dx'd 02/17/17   in remission 2 years now   had chemo    GERD (gastroesophageal reflux disease)    Heart murmur    07/03/21 echo: LVEF 60-65%, grade I DD, normal PASP, trivial MR, mild AI   History of radiation therapy 07/12/10,completed   right breast 60 Gy x30 fx   Hypertension    no meds currently   Hypothyroidism    Lymphoma (HCC)    Obesity    PE (pulmonary thromboembolism) (HCC) 01/2017   in setting of chemotherapy for lymphoma   Peripheral vascular disease (HCC) 1995   PT DEVELOPED CIRCULATION PROBLEMS IN BOTH HANDS AND GANGRENE OF BOTH INDEX FINGERS--REQUIRING AMPUTATION OF THE INDEX FINGERS AND  VASCULAR SURGERY.  PT TOLD HER PROBLEMS RELATED TO SMOKING.   NO OTHER PROBLEMS SINCE.   Personal history of radiation therapy 2012   Right Breast Cancer   Pneumonia    Thyroid  mass    Vocal cord paralysis    left   Past Surgical History:  Procedure Laterality Date   AMPUTATION     partial amputation of both index fingers   BREAST LUMPECTOMY Right 04/01/2010   BREAST SURGERY  2011   lumpectomy with node sampling- RIGHT   CATARACT EXTRACTION, BILATERAL  approx 2017   COLONOSCOPY  06/17/2022   per Dr. Leigh, one adenomatous polyp, no repeats needed   ESOPHAGOGASTRODUODENOSCOPY     EXCISION MASS NECK Left 12/26/2016   Procedure: EXCISION MASS NECK;  Surgeon: Jesus Oliphant, MD;  Location: [facility] OR;  Service: ENT;  Laterality: Left;  open excision of thyroid  mass left side with frozen section   HAND SURGERY Bilateral 1995   Amputaed pointer fingers bilaterally   IR FLUORO GUIDE PORT INSERTION RIGHT  01/14/2017   IR GASTROSTOMY TUBE MOD SED  02/19/2017   IR GASTROSTOMY TUBE REMOVAL  12/07/2018   IR PATIENT EVAL TECH 0-60 MINS  01/14/2019   IR REMOVAL TUN ACCESS W/ PORT W/O FL MOD SED  11/30/2017   IR REPLACE G-TUBE SIMPLE WO FLUORO  08/03/2017   IR REPLACE G-TUBE SIMPLE WO FLUORO  06/10/2018  IR REPLC GASTRO/COLONIC TUBE PERCUT W/FLUORO  02/25/2017   IR REPLC GASTRO/COLONIC TUBE PERCUT W/FLUORO  03/09/2017   IR US  GUIDE BX ASP/DRAIN  12/07/2018   IR US  GUIDE VASC ACCESS RIGHT  01/14/2017   JOINT REPLACEMENT     LAPAROSCOPIC GASTRIC RESECTION N/A 09/09/2019   Procedure: LAPAROSCOPIC CLOSURE OF GASTRO-CUTANEOUS FISTULA, EXCISION OF FISTUAL TRACK;  Surgeon: Ethyl Lenis, MD;  Location: [facility];  Service: General;  Laterality: N/A;   LUMBAR LAMINECTOMY/DECOMPRESSION MICRODISCECTOMY N/A 01/30/2022   Procedure: Microdiscetomy hemilaminectomy decompression Lumbar Five-Sacral One;  Surgeon: Duwayne Purchase, MD;  Location: [facility] OR;  Service: Orthopedics;  Laterality: N/A;  90 mins 3 C-Bed    TONSILLECTOMY     TOTAL HIP ARTHROPLASTY  12/16/2011   right hip   TOTAL HIP ARTHROPLASTY  01/20/2012   Procedure: TOTAL HIP ARTHROPLASTY ANTERIOR APPROACH;  Surgeon: Donnice JONETTA Car, MD;  Location: [facility];  Service: Orthopedics;  Laterality: Left;   TOTAL KNEE ARTHROPLASTY Right 05/31/2018   Procedure: TOTAL KNEE ARTHROPLASTY;  Surgeon: Melodi Lerner, MD;  Location: [facility];  Service: Orthopedics;  Laterality: Right;  Adductor Block   TUBAL LIGATION     VASCULAR SURGERY     both hands    reports that she quit smoking about 30 years ago. Her smoking use included cigarettes. She started smoking about 50 years ago. She has a 20 pack-year smoking history. She has never used smokeless tobacco. She reports that she does not currently use alcohol. She reports that she does not use drugs. family history includes Breast cancer in her sister; Cancer in her mother; Coronary artery disease in her brother, brother, and father; Diabetes in her father and sister; Hearing loss in her mother. Allergies  Allergen Reactions   Macrobid [Nitrofurantoin] Rash    Vasculitic rash    Review of Systems  Constitutional:  Negative for malaise/fatigue.  Eyes:  Negative for blurred vision.  Respiratory:  Negative for shortness of breath.   Cardiovascular:  Negative for chest pain.  Neurological:  Negative for dizziness, weakness and headaches.      Objective:     BP (!) 190/60   Pulse 86   Temp 98 F (36.7 C) (Oral)   Wt 190 lb 3.2 oz (86.3 kg)   SpO2 96%   BMI 36.86 kg/m  BP Readings from Last 3 Encounters:  12/18/23 (!) 190/60  08/12/23 130/64  12/10/22 106/60   Wt Readings from Last 3 Encounters:  12/18/23 190 lb 3.2 oz (86.3 kg)  11/12/23 188 lb (85.3 kg)  08/12/23 192 lb 11.2 oz (87.4 kg)      Physical Exam Vitals reviewed.  Constitutional:      General: She is not in acute distress.    Appearance: She is well-developed. She is not ill-appearing.  Eyes:     Pupils: Pupils are equal,  round, and reactive to light.  Neck:     Thyroid : No thyromegaly.     Vascular: No JVD.  Cardiovascular:     Rate and Rhythm: Normal rate and regular rhythm.     Heart sounds:     No gallop.  Pulmonary:     Effort: Pulmonary effort is normal. No respiratory distress.     Breath sounds: Normal breath sounds. No wheezing or rales.  Musculoskeletal:     Cervical back: Neck supple.     Right lower leg: No edema.     Left lower leg: No edema.  Neurological:     Mental Status: She is alert.  No results found for any visits on 12/18/23.  Last CBC Lab Results  Component Value Date   WBC 10.3 08/12/2023   HGB 11.4 (L) 08/12/2023   HCT 35.0 (L) 08/12/2023   MCV 78.5 08/12/2023   MCH 28.0 09/08/2022   RDW 17.4 (H) 08/12/2023   PLT 262.0 08/12/2023   Last metabolic panel Lab Results  Component Value Date   GLUCOSE 113 (H) 08/12/2023   NA 138 08/12/2023   K 4.2 08/12/2023   CL 105 08/12/2023   CO2 25 08/12/2023   BUN 31 (H) 08/12/2023   CREATININE 1.22 (H) 08/12/2023   GFR 42.97 (L) 08/12/2023   CALCIUM  9.0 08/12/2023   PHOS 2.5 09/01/2022   PROT 7.5 08/12/2023   ALBUMIN  3.7 08/12/2023   BILITOT 0.4 08/12/2023   ALKPHOS 90 08/12/2023   AST 18 08/12/2023   ALT 9 08/12/2023   ANIONGAP 9 09/08/2022   Last lipids Lab Results  Component Value Date   CHOL 194 07/22/2022   HDL 53.00 07/22/2022   LDLCALC 124 (H) 07/22/2022   LDLDIRECT 146.4 06/26/2006   TRIG 87.0 07/22/2022   CHOLHDL 4 07/22/2022   Last hemoglobin A1c Lab Results  Component Value Date   HGBA1C 5.9 08/12/2023      The 10-year ASCVD risk score (Arnett DK, et al., 2019) is: 71.9%    Assessment & Plan:   Hypertension poorly controlled .  - Discussed nonpharmacologic management with weight loss, try to keep daily sodium intake less than 2500 mg, avoidance of nonsteroidals, alcohol in moderation - Add losartan /HCTZ 50/12.5 mg 1 daily in addition of amlodipine  - Bring back in about 1 month to  reassess.  Consider follow-up basic metabolic panel then  Wolm Scarlet, MD

## 2023-12-30 DIAGNOSIS — L821 Other seborrheic keratosis: Secondary | ICD-10-CM | POA: Diagnosis not present

## 2023-12-30 DIAGNOSIS — Z85828 Personal history of other malignant neoplasm of skin: Secondary | ICD-10-CM | POA: Diagnosis not present

## 2023-12-30 DIAGNOSIS — D225 Melanocytic nevi of trunk: Secondary | ICD-10-CM | POA: Diagnosis not present

## 2023-12-30 DIAGNOSIS — L570 Actinic keratosis: Secondary | ICD-10-CM | POA: Diagnosis not present

## 2023-12-30 DIAGNOSIS — L814 Other melanin hyperpigmentation: Secondary | ICD-10-CM | POA: Diagnosis not present

## 2023-12-30 DIAGNOSIS — D485 Neoplasm of uncertain behavior of skin: Secondary | ICD-10-CM | POA: Diagnosis not present

## 2024-01-18 ENCOUNTER — Ambulatory Visit (INDEPENDENT_AMBULATORY_CARE_PROVIDER_SITE_OTHER): Admitting: Family Medicine

## 2024-01-18 ENCOUNTER — Ambulatory Visit: Payer: Self-pay | Admitting: Family Medicine

## 2024-01-18 ENCOUNTER — Encounter: Payer: Self-pay | Admitting: Family Medicine

## 2024-01-18 VITALS — BP 128/58 | HR 76 | Temp 98.0°F | Wt 190.9 lb

## 2024-01-18 DIAGNOSIS — E039 Hypothyroidism, unspecified: Secondary | ICD-10-CM

## 2024-01-18 DIAGNOSIS — I10 Essential (primary) hypertension: Secondary | ICD-10-CM

## 2024-01-18 DIAGNOSIS — K219 Gastro-esophageal reflux disease without esophagitis: Secondary | ICD-10-CM | POA: Diagnosis not present

## 2024-01-18 LAB — BASIC METABOLIC PANEL WITH GFR
BUN: 23 mg/dL (ref 6–23)
CO2: 27 meq/L (ref 19–32)
Calcium: 9.4 mg/dL (ref 8.4–10.5)
Chloride: 101 meq/L (ref 96–112)
Creatinine, Ser: 1.44 mg/dL — ABNORMAL HIGH (ref 0.40–1.20)
GFR: 35.11 mL/min — ABNORMAL LOW (ref >60.00)
Glucose, Bld: 94 mg/dL (ref 70–99)
Potassium: 4 meq/L (ref 3.5–5.1)
Sodium: 136 meq/L (ref 135–145)

## 2024-01-18 MED ORDER — AMLODIPINE BESYLATE 5 MG PO TABS: 5.0000 mg | ORAL_TABLET | Freq: Every day | ORAL | 3 refills | Status: AC

## 2024-01-18 MED ORDER — LEVOTHYROXINE SODIUM 75 MCG PO TABS: 75.0000 ug | ORAL_TABLET | Freq: Every day | ORAL | 3 refills | Status: AC

## 2024-01-18 MED ORDER — OMEPRAZOLE 20 MG PO CPDR: 20.0000 mg | DELAYED_RELEASE_CAPSULE | Freq: Every day | ORAL | 3 refills | Status: AC

## 2024-01-18 NOTE — Progress Notes (Signed)
 Established Patient Office Visit  Subjective   Patient ID: Kristen Escobar, female    DOB: 1946/03/10  Age: 78 y.o. MRN: [id number]  Chief Complaint  Patient presents with   Medical Management of Chronic Issues    HPI   Kristen Escobar is seen for follow-up regarding hypertension.  Refer to last note for details.  She had very high reading of 190/60 last visit.  We added losartan  HCTZ to her amlodipine .  She brings in a log of readings and these are consistently well-controlled with most readings 120s systolic and 60s diastolic.  She states her blood pressures tend to run higher generally in the morning but generally well-controlled throughout most of the day.  Tolerating medication without any side effects.  Trying to watch sodium intake closely.  She is needing refills of several medications today including amlodipine , levothyroxine , and omeprazole .  She had recent TSH back in April which was normal range.  She has longstanding history of reflux and has had esophageal stricture dilation in the past.  Currently no acute swallowing difficulties.  Still has occasional dysphagia.  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   Diffuse large B cell lymphoma (HCC) dx'd 02/17/17   in remission 2 years now   had chemo    GERD (gastroesophageal reflux disease)    Heart murmur    07/03/21 echo: LVEF 60-65%, grade I DD, normal PASP, trivial MR, mild AI   History of radiation therapy 07/12/10,completed   right breast 60 Gy x30 fx   Hypertension    no meds currently   Hypothyroidism    Lymphoma (HCC)    Obesity    PE (pulmonary thromboembolism) (HCC) 01/2017   in setting of chemotherapy for lymphoma   Peripheral  vascular disease 1995   PT DEVELOPED CIRCULATION PROBLEMS IN BOTH HANDS AND GANGRENE OF BOTH INDEX FINGERS--REQUIRING AMPUTATION OF THE INDEX FINGERS AND VASCULAR SURGERY.  PT TOLD HER PROBLEMS RELATED TO SMOKING.   NO OTHER PROBLEMS SINCE.   Personal history of radiation therapy 2012   Right Breast Cancer   Pneumonia    Thyroid  mass    Vocal cord paralysis    left   Past Surgical History:  Procedure Laterality Date   AMPUTATION     partial amputation of both index fingers   BREAST LUMPECTOMY Right 04/01/2010   BREAST SURGERY  2011   lumpectomy with node sampling- RIGHT   CATARACT EXTRACTION, BILATERAL  approx 2017   COLONOSCOPY  06/17/2022   per Dr. Leigh, one adenomatous polyp, no repeats needed   ESOPHAGOGASTRODUODENOSCOPY     EXCISION MASS NECK Left 12/26/2016   Procedure: EXCISION MASS NECK;  Surgeon: Jesus Oliphant, MD;  Location: [facility] OR;  Service: ENT;  Laterality: Left;  open excision of thyroid  mass left side with frozen section   HAND SURGERY Bilateral 1995   Amputaed pointer fingers bilaterally   IR FLUORO GUIDE PORT INSERTION RIGHT  01/14/2017   IR GASTROSTOMY TUBE MOD SED  02/19/2017   IR GASTROSTOMY TUBE REMOVAL  12/07/2018   IR PATIENT EVAL TECH 0-60 MINS  01/14/2019   IR REMOVAL TUN ACCESS W/ PORT W/O FL MOD SED  11/30/2017  IR REPLACE G-TUBE SIMPLE WO FLUORO  08/03/2017   IR REPLACE G-TUBE SIMPLE WO FLUORO  06/10/2018   IR REPLC GASTRO/COLONIC TUBE PERCUT W/FLUORO  02/25/2017   IR REPLC GASTRO/COLONIC TUBE PERCUT W/FLUORO  03/09/2017   IR US  GUIDE BX ASP/DRAIN  12/07/2018   IR US  GUIDE VASC ACCESS RIGHT  01/14/2017   JOINT REPLACEMENT     LAPAROSCOPIC GASTRIC RESECTION N/A 09/09/2019   Procedure: LAPAROSCOPIC CLOSURE OF GASTRO-CUTANEOUS FISTULA, EXCISION OF FISTUAL TRACK;  Surgeon: Ethyl Lenis, MD;  Location: [facility];  Service: General;  Laterality: N/A;   LUMBAR LAMINECTOMY/DECOMPRESSION MICRODISCECTOMY N/A 01/30/2022   Procedure: Microdiscetomy  hemilaminectomy decompression Lumbar Five-Sacral One;  Surgeon: Duwayne Purchase, MD;  Location: [facility] OR;  Service: Orthopedics;  Laterality: N/A;  90 mins 3 C-Bed   TONSILLECTOMY     TOTAL HIP ARTHROPLASTY  12/16/2011   right hip   TOTAL HIP ARTHROPLASTY  01/20/2012   Procedure: TOTAL HIP ARTHROPLASTY ANTERIOR APPROACH;  Surgeon: Donnice JONETTA Car, MD;  Location: [facility];  Service: Orthopedics;  Laterality: Left;   TOTAL KNEE ARTHROPLASTY Right 05/31/2018   Procedure: TOTAL KNEE ARTHROPLASTY;  Surgeon: Melodi Lerner, MD;  Location: [facility];  Service: Orthopedics;  Laterality: Right;  Adductor Block   TUBAL LIGATION     VASCULAR SURGERY     both hands    reports that she quit smoking about 30 years ago. Her smoking use included cigarettes. She started smoking about 50 years ago. She has a 20 pack-year smoking history. She has never used smokeless tobacco. She reports that she does not currently use alcohol. She reports that she does not use drugs. family history includes Breast cancer in her sister; Cancer in her mother; Coronary artery disease in her brother, brother, and father; Diabetes in her father and sister; Hearing loss in her mother. Allergies  Allergen Reactions   Macrobid [Nitrofurantoin] Rash    Vasculitic rash    Review of Systems  Constitutional:  Negative for malaise/fatigue.  Eyes:  Negative for blurred vision.  Respiratory:  Negative for shortness of breath.   Cardiovascular:  Negative for chest pain.  Neurological:  Negative for dizziness, weakness and headaches.      Objective:     BP (!) 128/58 (BP Location: Left Arm, Cuff Size: Large)   Pulse 76   Temp 98 F (36.7 C) (Oral)   Wt 190 lb 14.4 oz (86.6 kg)   SpO2 97%   BMI 36.99 kg/m  BP Readings from Last 3 Encounters:  01/18/24 (!) 128/58  12/18/23 (!) 190/60  08/12/23 130/64   Wt Readings from Last 3 Encounters:  01/18/24 190 lb 14.4 oz (86.6 kg)  12/18/23 190 lb 3.2 oz (86.3 kg)  11/12/23 188 lb (85.3  kg)      Physical Exam Vitals reviewed.  Constitutional:      Appearance: She is well-developed.  Eyes:     Pupils: Pupils are equal, round, and reactive to light.  Neck:     Thyroid : No thyromegaly.     Vascular: No JVD.  Cardiovascular:     Rate and Rhythm: Normal rate and regular rhythm.     Heart sounds:     No gallop.  Pulmonary:     Effort: Pulmonary effort is normal. No respiratory distress.     Breath sounds: Normal breath sounds. No wheezing or rales.  Musculoskeletal:     Cervical back: Neck supple.     Right lower leg: No edema.     Left lower  leg: No edema.  Neurological:     General: No focal deficit present.     Mental Status: She is alert.      No results found for any visits on 01/18/24.  Last CBC Lab Results  Component Value Date   WBC 10.3 08/12/2023   HGB 11.4 (L) 08/12/2023   HCT 35.0 (L) 08/12/2023   MCV 78.5 08/12/2023   MCH 28.0 09/08/2022   RDW 17.4 (H) 08/12/2023   PLT 262.0 08/12/2023   Last metabolic panel Lab Results  Component Value Date   GLUCOSE 113 (H) 08/12/2023   NA 138 08/12/2023   K 4.2 08/12/2023   CL 105 08/12/2023   CO2 25 08/12/2023   BUN 31 (H) 08/12/2023   CREATININE 1.22 (H) 08/12/2023   GFR 42.97 (L) 08/12/2023   CALCIUM  9.0 08/12/2023   PHOS 2.5 09/01/2022   PROT 7.5 08/12/2023   ALBUMIN  3.7 08/12/2023   BILITOT 0.4 08/12/2023   ALKPHOS 90 08/12/2023   AST 18 08/12/2023   ALT 9 08/12/2023   ANIONGAP 9 09/08/2022   Last lipids Lab Results  Component Value Date   CHOL 194 07/22/2022   HDL 53.00 07/22/2022   LDLCALC 124 (H) 07/22/2022   LDLDIRECT 146.4 06/26/2006   TRIG 87.0 07/22/2022   CHOLHDL 4 07/22/2022   Last hemoglobin A1c Lab Results  Component Value Date   HGBA1C 5.9 08/12/2023   Last thyroid  functions Lab Results  Component Value Date   TSH 3.13 08/12/2023      The 10-year ASCVD risk score (Arnett DK, et al., 2019) is: 43.6%    Assessment & Plan:   #1 hypertension greatly  improved with recent addition of losartan  HCTZ.  Blood pressure well-controlled by home readings and repeat here today improved as well.  Continue current regimen amlodipine  and losartan /HCTZ.  Check basic metabolic panel today.  Continue low-sodium diet and continue close home monitoring  #2 GERD controlled currently on omeprazole  20 mg daily.  Refilled medication for 1 year  #3 hypothyroidism.  TSH at goal in April.  Refill levothyroxine  for 1 year.  Recheck TSH at 52-month follow-up.  No follow-ups on file.    Wolm Scarlet, MD

## 2024-01-19 ENCOUNTER — Other Ambulatory Visit (HOSPITAL_BASED_OUTPATIENT_CLINIC_OR_DEPARTMENT_OTHER): Payer: Self-pay

## 2024-01-19 DIAGNOSIS — Z23 Encounter for immunization: Secondary | ICD-10-CM | POA: Diagnosis not present

## 2024-01-19 MED ORDER — COMIRNATY 30 MCG/0.3ML IM SUSY
0.3000 mL | PREFILLED_SYRINGE | Freq: Once | INTRAMUSCULAR | 0 refills | Status: AC
  Filled 2024-01-19: qty 0.3, 1d supply, fill #0

## 2024-02-01 ENCOUNTER — Other Ambulatory Visit (HOSPITAL_BASED_OUTPATIENT_CLINIC_OR_DEPARTMENT_OTHER): Payer: Self-pay

## 2024-02-01 DIAGNOSIS — Z23 Encounter for immunization: Secondary | ICD-10-CM | POA: Diagnosis not present

## 2024-02-01 MED ORDER — FLUZONE HIGH-DOSE 0.5 ML IM SUSY
0.5000 mL | PREFILLED_SYRINGE | Freq: Once | INTRAMUSCULAR | 0 refills | Status: AC
  Filled 2024-02-01: qty 0.5, 1d supply, fill #0

## 2024-02-24 DIAGNOSIS — E039 Hypothyroidism, unspecified: Secondary | ICD-10-CM | POA: Diagnosis not present

## 2024-02-24 DIAGNOSIS — K219 Gastro-esophageal reflux disease without esophagitis: Secondary | ICD-10-CM | POA: Diagnosis not present

## 2024-02-24 DIAGNOSIS — R1310 Dysphagia, unspecified: Secondary | ICD-10-CM | POA: Diagnosis not present

## 2024-02-24 DIAGNOSIS — Z8572 Personal history of non-Hodgkin lymphomas: Secondary | ICD-10-CM | POA: Diagnosis not present

## 2024-02-24 DIAGNOSIS — Z8709 Personal history of other diseases of the respiratory system: Secondary | ICD-10-CM | POA: Diagnosis not present

## 2024-02-24 DIAGNOSIS — I10 Essential (primary) hypertension: Secondary | ICD-10-CM | POA: Diagnosis not present
